# Patient Record
Sex: Female | Born: 1955
Health system: Southern US, Community
[De-identification: ages and names within clinical notes are randomized; demographics above are authoritative.]

## PROBLEM LIST (undated history)

## (undated) DIAGNOSIS — I219 Acute myocardial infarction, unspecified: Secondary | ICD-10-CM

## (undated) DIAGNOSIS — R7303 Prediabetes: Secondary | ICD-10-CM

## (undated) DIAGNOSIS — Z87442 Personal history of urinary calculi: Secondary | ICD-10-CM

## (undated) DIAGNOSIS — I82409 Acute embolism and thrombosis of unspecified deep veins of unspecified lower extremity: Secondary | ICD-10-CM

## (undated) DIAGNOSIS — I7101 Dissection of ascending aorta: Secondary | ICD-10-CM

## (undated) DIAGNOSIS — G4733 Obstructive sleep apnea (adult) (pediatric): Principal | ICD-10-CM

## (undated) DIAGNOSIS — R319 Hematuria, unspecified: Secondary | ICD-10-CM

## (undated) DIAGNOSIS — D649 Anemia, unspecified: Secondary | ICD-10-CM

## (undated) DIAGNOSIS — I2510 Atherosclerotic heart disease of native coronary artery without angina pectoris: Secondary | ICD-10-CM

## (undated) DIAGNOSIS — K219 Gastro-esophageal reflux disease without esophagitis: Secondary | ICD-10-CM

## (undated) DIAGNOSIS — G4730 Sleep apnea, unspecified: Secondary | ICD-10-CM

## (undated) DIAGNOSIS — I509 Heart failure, unspecified: Secondary | ICD-10-CM

## (undated) DIAGNOSIS — E785 Hyperlipidemia, unspecified: Secondary | ICD-10-CM

## (undated) DIAGNOSIS — R42 Dizziness and giddiness: Secondary | ICD-10-CM

## (undated) DIAGNOSIS — I420 Dilated cardiomyopathy: Secondary | ICD-10-CM

## (undated) DIAGNOSIS — Z9989 Dependence on other enabling machines and devices: Principal | ICD-10-CM

## (undated) DIAGNOSIS — Z5189 Encounter for other specified aftercare: Secondary | ICD-10-CM

## (undated) DIAGNOSIS — M1990 Unspecified osteoarthritis, unspecified site: Secondary | ICD-10-CM

## (undated) DIAGNOSIS — H269 Unspecified cataract: Secondary | ICD-10-CM

## (undated) DIAGNOSIS — I351 Nonrheumatic aortic (valve) insufficiency: Secondary | ICD-10-CM

## (undated) DIAGNOSIS — IMO0001 Reserved for inherently not codable concepts without codable children: Secondary | ICD-10-CM

## (undated) DIAGNOSIS — I10 Essential (primary) hypertension: Secondary | ICD-10-CM

## (undated) HISTORY — DX: Morbid (severe) obesity due to excess calories: E66.01

## (undated) HISTORY — DX: Acute embolism and thrombosis of unspecified deep veins of unspecified lower extremity: I82.409

## (undated) HISTORY — DX: Unspecified cataract: H26.9

## (undated) HISTORY — DX: Gastro-esophageal reflux disease without esophagitis: K21.9

## (undated) HISTORY — DX: Essential (primary) hypertension: I10

## (undated) HISTORY — DX: Hematuria, unspecified: R31.9

## (undated) HISTORY — DX: Dilated cardiomyopathy: I42.0

## (undated) HISTORY — DX: Dissection of thoracic aorta: I71.01

## (undated) HISTORY — DX: Anemia, unspecified: D64.9

## (undated) HISTORY — DX: Hyperlipidemia, unspecified: E78.5

## (undated) HISTORY — DX: Obstructive sleep apnea (adult) (pediatric): G47.33

## (undated) HISTORY — DX: Reserved for inherently not codable concepts without codable children: IMO0001

## (undated) HISTORY — DX: Nonrheumatic aortic (valve) insufficiency: I35.1

## (undated) HISTORY — DX: Dependence on other enabling machines and devices: Z99.89

## (undated) HISTORY — DX: Dissection of ascending aorta: I71.010

## (undated) HISTORY — DX: Acute myocardial infarction, unspecified: I21.9

## (undated) HISTORY — DX: Encounter for other specified aftercare: Z51.89

---

## 1981-01-12 HISTORY — PX: ABDOMINAL HYSTERECTOMY: SHX81

## 1997-03-12 ENCOUNTER — Ambulatory Visit (HOSPITAL_COMMUNITY): Admission: RE | Admit: 1997-03-12 | Discharge: 1997-03-12 | Payer: Self-pay | Admitting: Internal Medicine

## 1997-05-25 ENCOUNTER — Emergency Department (HOSPITAL_COMMUNITY): Admission: EM | Admit: 1997-05-25 | Discharge: 1997-05-25 | Payer: Self-pay | Admitting: Emergency Medicine

## 1997-07-17 ENCOUNTER — Emergency Department (HOSPITAL_COMMUNITY): Admission: EM | Admit: 1997-07-17 | Discharge: 1997-07-17 | Payer: Self-pay | Admitting: Emergency Medicine

## 1997-09-24 ENCOUNTER — Ambulatory Visit (HOSPITAL_COMMUNITY): Admission: RE | Admit: 1997-09-24 | Discharge: 1997-09-24 | Payer: Self-pay | Admitting: Internal Medicine

## 1998-01-17 ENCOUNTER — Inpatient Hospital Stay (HOSPITAL_COMMUNITY): Admission: AD | Admit: 1998-01-17 | Discharge: 1998-01-17 | Payer: Self-pay | Admitting: Internal Medicine

## 1998-03-03 ENCOUNTER — Emergency Department (HOSPITAL_COMMUNITY): Admission: EM | Admit: 1998-03-03 | Discharge: 1998-03-03 | Payer: Self-pay | Admitting: Emergency Medicine

## 1998-03-03 ENCOUNTER — Encounter: Payer: Self-pay | Admitting: Emergency Medicine

## 1998-04-04 ENCOUNTER — Emergency Department (HOSPITAL_COMMUNITY): Admission: EM | Admit: 1998-04-04 | Discharge: 1998-04-04 | Payer: Self-pay | Admitting: Emergency Medicine

## 1998-05-07 ENCOUNTER — Inpatient Hospital Stay (HOSPITAL_COMMUNITY): Admission: EM | Admit: 1998-05-07 | Discharge: 1998-05-09 | Payer: Self-pay | Admitting: Emergency Medicine

## 1998-05-07 ENCOUNTER — Encounter: Payer: Self-pay | Admitting: Internal Medicine

## 1998-05-08 ENCOUNTER — Encounter: Payer: Self-pay | Admitting: Internal Medicine

## 1998-06-04 ENCOUNTER — Encounter: Payer: Self-pay | Admitting: Emergency Medicine

## 1998-06-04 ENCOUNTER — Emergency Department (HOSPITAL_COMMUNITY): Admission: EM | Admit: 1998-06-04 | Discharge: 1998-06-04 | Payer: Self-pay | Admitting: Emergency Medicine

## 1998-06-29 ENCOUNTER — Emergency Department (HOSPITAL_COMMUNITY): Admission: EM | Admit: 1998-06-29 | Discharge: 1998-06-29 | Payer: Self-pay | Admitting: Emergency Medicine

## 1998-07-19 ENCOUNTER — Encounter: Admission: RE | Admit: 1998-07-19 | Discharge: 1998-10-17 | Payer: Self-pay | Admitting: Orthopedic Surgery

## 1999-01-13 HISTORY — PX: CHOLECYSTECTOMY: SHX55

## 1999-09-25 ENCOUNTER — Encounter: Payer: Self-pay | Admitting: Emergency Medicine

## 1999-09-25 ENCOUNTER — Inpatient Hospital Stay (HOSPITAL_COMMUNITY): Admission: EM | Admit: 1999-09-25 | Discharge: 1999-09-27 | Payer: Self-pay | Admitting: Emergency Medicine

## 1999-09-27 ENCOUNTER — Encounter: Payer: Self-pay | Admitting: Internal Medicine

## 1999-10-27 ENCOUNTER — Other Ambulatory Visit: Admission: RE | Admit: 1999-10-27 | Discharge: 1999-10-27 | Payer: Self-pay | Admitting: Internal Medicine

## 2000-04-21 ENCOUNTER — Emergency Department (HOSPITAL_COMMUNITY): Admission: EM | Admit: 2000-04-21 | Discharge: 2000-04-21 | Payer: Self-pay | Admitting: Emergency Medicine

## 2000-11-12 ENCOUNTER — Emergency Department (HOSPITAL_COMMUNITY): Admission: EM | Admit: 2000-11-12 | Discharge: 2000-11-13 | Payer: Self-pay | Admitting: Emergency Medicine

## 2001-05-19 ENCOUNTER — Emergency Department (HOSPITAL_COMMUNITY): Admission: EM | Admit: 2001-05-19 | Discharge: 2001-05-20 | Payer: Self-pay | Admitting: Emergency Medicine

## 2001-05-20 ENCOUNTER — Encounter: Payer: Self-pay | Admitting: Emergency Medicine

## 2001-09-07 ENCOUNTER — Encounter: Admission: RE | Admit: 2001-09-07 | Discharge: 2001-09-07 | Payer: Self-pay | Admitting: Internal Medicine

## 2001-09-07 ENCOUNTER — Encounter: Payer: Self-pay | Admitting: Internal Medicine

## 2003-04-12 ENCOUNTER — Emergency Department (HOSPITAL_COMMUNITY): Admission: EM | Admit: 2003-04-12 | Discharge: 2003-04-12 | Payer: Self-pay | Admitting: Emergency Medicine

## 2004-04-01 ENCOUNTER — Other Ambulatory Visit: Admission: RE | Admit: 2004-04-01 | Discharge: 2004-04-01 | Payer: Self-pay | Admitting: Internal Medicine

## 2004-04-21 ENCOUNTER — Encounter: Admission: RE | Admit: 2004-04-21 | Discharge: 2004-04-21 | Payer: Self-pay | Admitting: Internal Medicine

## 2004-04-29 ENCOUNTER — Ambulatory Visit (HOSPITAL_COMMUNITY): Admission: RE | Admit: 2004-04-29 | Discharge: 2004-04-29 | Payer: Self-pay | Admitting: Gastroenterology

## 2004-04-29 ENCOUNTER — Encounter (INDEPENDENT_AMBULATORY_CARE_PROVIDER_SITE_OTHER): Payer: Self-pay | Admitting: *Deleted

## 2005-04-08 ENCOUNTER — Emergency Department (HOSPITAL_COMMUNITY): Admission: EM | Admit: 2005-04-08 | Discharge: 2005-04-08 | Payer: Self-pay | Admitting: Emergency Medicine

## 2005-04-14 ENCOUNTER — Ambulatory Visit (HOSPITAL_BASED_OUTPATIENT_CLINIC_OR_DEPARTMENT_OTHER): Admission: RE | Admit: 2005-04-14 | Discharge: 2005-04-14 | Payer: Self-pay | Admitting: Urology

## 2005-04-20 ENCOUNTER — Ambulatory Visit (HOSPITAL_COMMUNITY): Admission: RE | Admit: 2005-04-20 | Discharge: 2005-04-20 | Payer: Self-pay | Admitting: Urology

## 2005-12-18 ENCOUNTER — Encounter: Admission: RE | Admit: 2005-12-18 | Discharge: 2005-12-18 | Payer: Self-pay | Admitting: Internal Medicine

## 2007-01-13 LAB — HM COLONOSCOPY: HM Colonoscopy: NORMAL

## 2007-01-14 ENCOUNTER — Encounter: Admission: RE | Admit: 2007-01-14 | Discharge: 2007-01-14 | Payer: Self-pay | Admitting: Internal Medicine

## 2008-07-10 ENCOUNTER — Encounter: Admission: RE | Admit: 2008-07-10 | Discharge: 2008-07-10 | Payer: Self-pay | Admitting: Internal Medicine

## 2009-06-28 ENCOUNTER — Emergency Department (HOSPITAL_COMMUNITY): Admission: EM | Admit: 2009-06-28 | Discharge: 2009-06-28 | Payer: Self-pay | Admitting: Emergency Medicine

## 2009-10-07 ENCOUNTER — Encounter: Admission: RE | Admit: 2009-10-07 | Discharge: 2009-10-07 | Payer: Self-pay | Admitting: Internal Medicine

## 2009-10-07 LAB — HM MAMMOGRAPHY

## 2009-11-12 LAB — CONVERTED CEMR LAB: Pap Smear: NORMAL

## 2009-11-22 ENCOUNTER — Encounter: Payer: Self-pay | Admitting: Internal Medicine

## 2009-11-28 ENCOUNTER — Encounter: Payer: Self-pay | Admitting: Internal Medicine

## 2009-11-28 LAB — CONVERTED CEMR LAB
BUN: 11 mg/dL
CO2: 28 meq/L
Calcium: 9.2 mg/dL
Chloride: 103 meq/L
Creatinine, Ser: 0.93 mg/dL
Glucose, Bld: 107 mg/dL
Potassium: 3.3 meq/L
Sodium: 138 meq/L

## 2009-12-01 ENCOUNTER — Emergency Department (HOSPITAL_COMMUNITY): Admission: EM | Admit: 2009-12-01 | Discharge: 2009-12-02 | Payer: Self-pay | Admitting: Emergency Medicine

## 2009-12-02 ENCOUNTER — Encounter: Payer: Self-pay | Admitting: Internal Medicine

## 2009-12-02 LAB — CONVERTED CEMR LAB
BUN: 15 mg/dL
Chloride: 106 meq/L
Creatinine, Ser: 1.1 mg/dL
Glucose, Bld: 105 mg/dL
HCT: 41 %
Hemoglobin: 13.9 g/dL
Potassium: 3.6 meq/L
Sodium: 140 meq/L

## 2009-12-16 ENCOUNTER — Encounter: Payer: Self-pay | Admitting: Internal Medicine

## 2009-12-16 DIAGNOSIS — Z8679 Personal history of other diseases of the circulatory system: Secondary | ICD-10-CM | POA: Insufficient documentation

## 2009-12-16 DIAGNOSIS — E785 Hyperlipidemia, unspecified: Secondary | ICD-10-CM | POA: Insufficient documentation

## 2009-12-16 DIAGNOSIS — I119 Hypertensive heart disease without heart failure: Secondary | ICD-10-CM | POA: Insufficient documentation

## 2009-12-16 DIAGNOSIS — Z87442 Personal history of urinary calculi: Secondary | ICD-10-CM | POA: Insufficient documentation

## 2009-12-16 DIAGNOSIS — IMO0001 Reserved for inherently not codable concepts without codable children: Secondary | ICD-10-CM | POA: Insufficient documentation

## 2009-12-17 ENCOUNTER — Encounter: Payer: Self-pay | Admitting: Internal Medicine

## 2009-12-17 ENCOUNTER — Ambulatory Visit: Payer: Self-pay | Admitting: Internal Medicine

## 2009-12-17 DIAGNOSIS — R319 Hematuria, unspecified: Secondary | ICD-10-CM | POA: Insufficient documentation

## 2009-12-18 DIAGNOSIS — K219 Gastro-esophageal reflux disease without esophagitis: Secondary | ICD-10-CM | POA: Insufficient documentation

## 2009-12-18 LAB — CONVERTED CEMR LAB
Bilirubin Urine: NEGATIVE
Hemoglobin, Urine: NEGATIVE
Ketones, ur: NEGATIVE mg/dL
Leukocytes, UA: NEGATIVE
Nitrite: NEGATIVE
Specific Gravity, Urine: 1.015 (ref 1.000–1.030)
Total Protein, Urine: NEGATIVE mg/dL
Urine Glucose: NEGATIVE mg/dL
Urobilinogen, UA: 0.2 (ref 0.0–1.0)
pH: 7 (ref 5.0–8.0)

## 2010-01-02 ENCOUNTER — Telehealth (INDEPENDENT_AMBULATORY_CARE_PROVIDER_SITE_OTHER): Payer: Self-pay | Admitting: *Deleted

## 2010-01-11 ENCOUNTER — Emergency Department (HOSPITAL_COMMUNITY)
Admission: EM | Admit: 2010-01-11 | Discharge: 2010-01-11 | Payer: Self-pay | Source: Home / Self Care | Admitting: Family Medicine

## 2010-01-14 ENCOUNTER — Ambulatory Visit
Admission: RE | Admit: 2010-01-14 | Discharge: 2010-01-14 | Payer: Self-pay | Source: Home / Self Care | Attending: Internal Medicine | Admitting: Internal Medicine

## 2010-01-14 ENCOUNTER — Telehealth: Payer: Self-pay | Admitting: Internal Medicine

## 2010-01-14 ENCOUNTER — Other Ambulatory Visit: Payer: Self-pay | Admitting: Internal Medicine

## 2010-01-14 LAB — URINALYSIS, ROUTINE W REFLEX MICROSCOPIC
Bilirubin Urine: NEGATIVE
Hemoglobin, Urine: NEGATIVE
Ketones, ur: NEGATIVE
Leukocytes, UA: NEGATIVE
Nitrite: NEGATIVE
Specific Gravity, Urine: 1.02 (ref 1.000–1.030)
Total Protein, Urine: NEGATIVE
Urine Glucose: NEGATIVE
Urobilinogen, UA: 0.2 (ref 0.0–1.0)
pH: 7.5 (ref 5.0–8.0)

## 2010-01-16 ENCOUNTER — Encounter (INDEPENDENT_AMBULATORY_CARE_PROVIDER_SITE_OTHER): Payer: Self-pay | Admitting: *Deleted

## 2010-01-16 ENCOUNTER — Telehealth: Payer: Self-pay | Admitting: Internal Medicine

## 2010-01-28 ENCOUNTER — Ambulatory Visit
Admission: RE | Admit: 2010-01-28 | Discharge: 2010-01-28 | Payer: Self-pay | Source: Home / Self Care | Attending: Internal Medicine | Admitting: Internal Medicine

## 2010-02-02 ENCOUNTER — Encounter: Payer: Self-pay | Admitting: Internal Medicine

## 2010-02-03 ENCOUNTER — Encounter: Payer: Self-pay | Admitting: Internal Medicine

## 2010-02-13 NOTE — Assessment & Plan Note (Signed)
Summary: NEW / MEDCOST Alexis Wheeler   Vital Signs:  Patient profile:   55 year old female Weight:      202 pounds O2 Sat:      96 % on Room air Temp:     98.9 degrees F oral Pulse rate:   82 / minute Pulse rhythm:   regular BP sitting:   140 / 98  (left arm) Cuff size:   large  Vitals Entered By: Rock Nephew CMA (December 17, 2009 3:12 PM)  O2 Flow:  Room air CC: New Patient, discuss elevated BP Is Patient Diabetic? No Pain Assessment Patient in pain? no       Does patient need assistance? Functional Status Self care Ambulation Normal Comments Pt not taking Norvasc and Metoprolol   Primary Care Provider:  Newt Lukes MD  CC:  New Patient and discuss elevated BP.  History of Present Illness: new pt to me and our practice, here to est care prev followed with [facility]ne and gyn ([facility])  c/o uncontrolled HTN - onset mid20s - on meds since that time - prior tx includes dyazide (remains on same), toprol (caused fatigue), amlodipine (?why stopped), benicar (caused HA) and benazapril (recent dose increase) a/w HA when BP elevated, also chronic edema no assoc CP or vision changes - not improved with low salt diet no known kidney problems but reports chronic recurrent hematuria (none in past 3 days)  c/o GERD - episodes 1-2x/week but inc freq.  takes OTC meds for same with partial relief - no abd pain, no nausea, vomitting or change in bowels - denies relief from dietary changes, some improved with position change (elevation of head of bed) - reports plans for prior egd to eval same but unable to f/u with gi due to missed appt and dismissal from practice ([facility])    Preventive Screening-Counseling & Management  Alcohol-Tobacco     Alcohol drinks/day: 0     Alcohol Counseling: not indicated; patient does not drink     Smoking Status: never     Tobacco Counseling: not indicated; no tobacco use  Caffeine-Diet-Exercise     Does Patient Exercise: no     Exercise  Counseling: to improve exercise regimen     Depression Counseling: not indicated; screening negative for depression  Safety-Violence-Falls     Seat Belt Counseling: not indicated; patient wears seat belts     Firearm Counseling: not applicable     Violence Counseling: not indicated; no violence risk noted  Clinical Review Panels:  Prevention   Last Mammogram:  [facility].   No specific mammographic evidence of malignancy.  Assessment: BIRADS 1. (10/07/2009)   Last Pap Smear:  normal (11/12/2009)   Last Colonoscopy:  normal (01/13/2007)  Diabetes Management   Creatinine:  1.1 (12/02/2009)  CBC   Hgb:  13.9 (12/02/2009)   Hct:  41.0 (12/02/2009)  Complete Metabolic Panel   Glucose:  105 (12/02/2009)   Sodium:  140 (12/02/2009)   Potassium:  3.6 (12/02/2009)   Chloride:  106 (12/02/2009)   CO2:  28 (11/28/2009)   BUN:  15 (12/02/2009)   Creatinine:  1.1 (12/02/2009)   Calcium:  9.2 (11/28/2009)   Current Medications (verified): 1)  Norvasc 5 Mg Tabs (Amlodipine Besylate) .... Take 1 By Mouth Once Daily 2)  Metoprolol Succinate 50 Mg Xr24h-Tab (Metoprolol Succinate) .... Take 1 By Mouth Once Daily 3)  Dyazide 37.5-25 Mg Caps (Triamterene-Hctz) .... Take 1 By Mouth Once Daily 4)  Benazepril Hcl  40 Mg Tabs (Benazepril Hcl) .... Take 1 Tablet By Mouth Once A Day 5)  Klor-Con M20 20 Meq Cr-Tabs (Potassium Chloride Crys Cr) .... Take 1 Tablet By Mouth Once A Day 6)  Bayer 81mg  .... Take 1 Tablet By Mouth Once A Day 7)  Vitamin B 12 250mg  .... Take 1 Tablet By Mouth Once A Day 8)  Vitamin C 500 .... Take 1 Tablet By Mouth Once A Day 9)  Vitamin D3 1000mg  .... Take One Tablet Every Other Day 10)  Biotin .... Take 1 Tablet By Mouth Once A Day  Allergies (verified): 1)  ! 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  The patient denies anorexia, fever, weight loss, chest pain, syncope, dyspnea on exertion, headaches, abdominal pain, melena, hematochezia, incontinence, genital sores, muscle weakness, suspicious skin lesions, depression, and breast masses.         otherwise see HPI above. I have reviewed all other systems and they were negative.   Physical Exam  General:  overweight-appearing.  alert, well-developed, well-nourished, and cooperative to examination.    Head:  Normocephalic and atraumatic without obvious abnormalities. No apparent alopecia or balding. Eyes:  vision grossly intact; pupils equal, round and reactive to light.  conjunctiva and lids normal.    Ears:  B normal appearing ears. Hearing grossly normal bilaterally  Mouth:  teeth and gums in good repair; mucous membranes moist, without lesions or ulcers. oropharynx clear without exudate, no erythema.  Neck:  thick, supple, full ROM, no masses, no thyromegaly; no thyroid nodules or tenderness. no JVD or carotid bruits.   Lungs:  normal respiratory effort, no intercostal retractions or use of accessory muscles; normal breath sounds bilaterally - no crackles and no wheezes.    Heart:  normal rate, regular rhythm, no murmur, and no rub. BLE without edema but fatty ankles Abdomen:  obese, soft, non-tender, normal bowel sounds, no distention; no masses and no appreciable hepatomegaly or splenomegaly.   Msk:  No deformity or scoliosis noted  of thoracic or lumbar spine.   Neurologic:  alert & oriented X3 and cranial nerves II-XII symetrically intact.  strength normal in all extremities, sensation intact to light touch, and gait normal. speech fluent without dysarthria or aphasia; follows commands with good comprehension.  Skin:  no rashes, vesicles, ulcers, or erythema. No nodules or irregularity to palpation.  Psych:  Oriented X3, memory intact for recent and remote, normally interactive, good eye contact, not anxious appearing, not depressed appearing, and not agitated.      Impression & Recommendations:  Problem # 1:  HYPERTENSION (ICD-401.9)  subop control on current meds - pt reports labile - will send for prior records to review from PCP add amlodipine to current acei and diuretic combo tx limited by $$ - needs med from $[facility] planned - reviewed same  today with pt recheck her 2 weeks and cont titration and med adjustment as needed  The following medications were removed from the medication list:    Metoprolol Succinate 50 Mg Xr24h-tab (Metoprolol succinate) .[name]... Take 1 by mouth once daily Her updated medication list for this problem includes:    Amlodipine Besylate 5 Mg Tabs (Amlodipine besylate) .[name]... 1 by mouth once daily    Dyazide 37.5-25 Mg Caps (Triamterene-hctz) .[name]... Take 1 by mouth once daily    Benazepril Hcl 40 Mg Tabs (Benazepril hcl) .[name]... Take 1 tablet by mouth once a day  BP today: 140/98  Labs Reviewed: K+: 3.6 (12/02/2009) Creat: : 1.1 (12/02/2009)     Orders: Prescription Created Electronically [id number]) EKG w/ Interpretation (93000)  Problem # 2:  GERD (ICD-530.81)  freq symptoms not relieved with otc meds  not using PPI - add same now - erx done pt concerned with need for EGD - exam benign, no apparent risk for ulcer dz review prior records once here and monitor for response to PPI - consider refer as needed  Her updated medication list for this problem includes:    Omeprazole 20 Mg  Cpdr (Omeprazole) .[name]... 1 by mouth once daily  Orders: Prescription Created Electronically ([id number])  Problem # 3:  HEMATURIA UNSPECIFIED (ICD-599.70)  c/o intermitt gross hematuria - symptoms worse with uti but present even after abx tx by gyn -  check UA now and consider uro eval if needed Orders: TLB-Udip w/ Micro (81001-URINE)  Discussed medication use and hematuria work-up.   Problem # 4:  HYPERLIPIDEMIA (ICD-272.4) send for prior records - advised diet and exercise lifestyle changes  Problem # 5:  MORBID OBESITY (ICD-278.01)  Wt: 202 (12/17/2009)     Complete Medication List: 1)  Amlodipine Besylate 5 Mg Tabs (Amlodipine besylate) .[name].. 1 by mouth once daily 2)  Dyazide 37.5-25 Mg Caps (Triamterene-hctz) .... Take 1 by mouth once daily 3)  Benazepril Hcl 40 Mg Tabs (Benazepril hcl) .... Take 1 tablet by mouth once a day 4)  Klor-con M20 20 Meq Cr-tabs (Potassium chloride crys cr) .... Take 1 tablet by mouth once a day 5)  Bayer Low Strength 81 Mg Tbec (Aspirin) .[name].. 1 by mouth once daily 6)  Vitamin B 12 250mg   .... Take 1 tablet by mouth once a day 7)  Vitamin C 500  .... Take 1 tablet by mouth once a day 8)  Vitamin D3 1000mg   .... Take one tablet every other day 9)  Biotin  .... Take 1 tablet by mouth once a day 10)  Omeprazole 20 Mg Cpdr (Omeprazole) .[name].. 1 by mouth once daily  Patient Instructions: 1)  it was good to see you today. 2)  will send for records from dr. Concepcion Elk to review 3)  start amlodipine (generic norvasc) in addition to benazapril and maxide for high blood pressure 4)  also omeprazole for stomach - take this every day 5)  your prescriptions have been electronically submitted to your pharmacy. Please take as directed. Contact our office if you believe you're having problems with the medication(s).  6)  urine test(s) ordered today - your results will be posted on the phone tree for review in 48-72 hours from the time of test completion; call  [phone] and enter your 9 digit MRN (listed above on this page, just below your name); if any changes need to be made or there are abnormal results, you will be contacted directly.  7)  Please schedule a  follow-up appointment in 4 weeks to recheck blood pressure and continue review, call sooner if problems.  Prescriptions: OMEPRAZOLE 20 MG CPDR (OMEPRAZOLE) 1 by mouth once daily  #30 x 3   Entered and Authorized by:   Newt Lukes MD   Signed by:   Newt Lukes MD on 12/17/2009   Method used:   Electronically to        [facility] (retail)       [address]       [place], [place]  [address]       Ph: [phone]       Fax: [phone]   RxID:   [id number] AMLODIPINE BESYLATE 5 MG TABS (AMLODIPINE BESYLATE) 1 by mouth once daily  #30 x 3   Entered and Authorized by:   Newt Lukes MD   Signed by:   Newt Lukes MD on 12/17/2009   Method used:   Electronically to        [facility] (retail)       [address]       [place], [place]  [address]       Ph: [phone]       Fax: [phone]   RxID:   [id number]    Orders Added: 1)  TLB-Udip w/ Micro [81001-URINE] 2)  New Patient Level IV [88416] 3)  Prescription Created Electronically [G8553] 4)  EKG w/ Interpretation [93000]   Not Administered:    Influenza Vaccine not given due to: declined   Preventive Care Screening  Pap Smear:    Date:  11/12/2009    Results:  normal   Colonoscopy:    Date:  01/13/2007    Results:  normal

## 2010-02-13 NOTE — Progress Notes (Signed)
Summary: work note  Phone Note Call from Patient Call back at [employer] [phone]   Caller: Patient Call For: Newt Lukes MD Summary of Call: Pt had an appt on 01/14/2010 and pt stated that her work does not let her come back to work without  Dr. Diamantina Monks note. Please advise.  Initial call taken by: Livingston Diones,  January 16, 2010 10:35 AM  Follow-up for Phone Call        work note done and signed Follow-up by: Newt Lukes MD,  January 16, 2010 11:11 AM

## 2010-02-13 NOTE — Letter (Signed)
Summary: Out of Work  [facility]  [address] [place], [place] [address]   Phone: [phone]  Fax: [phone]    January 16, 2010   Employee:  Alexis Wheeler    To Whom It May Concern:   For Medical reasons, please excuse the above named employee from work for the following dates:  Start: 01/14/10    End: 01/16/10, May return back to work on Friday 01/17/10    If you need additional information, please feel free to contact our office.         Sincerely,    Dr. Rene Paci

## 2010-02-13 NOTE — Assessment & Plan Note (Signed)
Summary: 2 - 3 WK FU  STC   Vital Signs:  Patient profile:   55 year old female Height:      67 inches (170.18 cm) Weight:      202 pounds (91.82 kg) O2 Sat:      98 % on Room air Temp:     98.3 degrees F (36.83 degrees C) oral Pulse rate:   80 / minute BP sitting:   142 / 82  (left arm) Cuff size:   large  Vitals Entered By: Orlan Leavens RMA (January 28, 2010 10:42 AM)  O2 Flow:  Room air CC: 2-3 week follow-up Is Patient Diabetic? No Pain Assessment Patient in pain? yes     Location: lower back Type: aching   Primary Care Provider:  Newt Lukes MD  CC:  2-3 week follow-up.  History of Present Illness: here for f/u-  HTN - onset mid20s - on meds since that time - prior tx includes dyazide (remains on same), toprol (caused fatigue), amlodipine, benicar (caused HA) and benazapril (recent dose increase) a/w HA when BP elevated, also chronic edema no assoc CP or vision changes - not improved with low salt diet no known kidney problems but reports chronic recurrent hematuria (none in past 3 days) syncope 01/2009 from low BP - meds adjusted -  tol current combo well, no further syncope  GERD - episodes 1-2x/week but inc freq.  takes OTC meds for same with partial relief - no abd pain, no nausea, vomitting or change in bowels - denies relief from dietary changes, some improved with position change (elevation of head of bed) - reports plans for prior egd to eval same but unable to f/u with gi due to missed appt and dismissal from practice ([facility]) - some improved with PPI once daily - still with gas  LBP - onset 2 weeks ago s/p fall/syncope - no weakness, no numbness, no difficulty walking - improved with heat and using meds form ER (not used at time of fall when rx'd)    Clinical Review Panels:  Complete Metabolic Panel   Glucose:  105 (12/02/2009)   Sodium:  140 (12/02/2009)   Potassium:  3.6 (12/02/2009)   Chloride:  106 (12/02/2009)   CO2:  28 (11/28/2009)  BUN:  15 (12/02/2009)   Creatinine:  1.1 (12/02/2009)   Calcium:  9.2 (11/28/2009)   Current Medications (verified): 1)  Amlodipine Besylate 5 Mg Tabs (Amlodipine Besylate) .... 1/2 By Mouth Once Daily 2)  Dyazide 37.5-25 Mg Caps (Triamterene-Hctz) .... Take 1 By Mouth Once Daily 3)  Benazepril Hcl 40 Mg Tabs (Benazepril Hcl) .... Take 1 Tablet By Mouth Once A Day 4)  Klor-Con M20 20 Meq Cr-Tabs (Potassium Chloride Crys Cr) .... Take 1 Tablet By Mouth Once A Day 5)  Bayer Low Strength 81 Mg Tbec (Aspirin) .[name].. 1 By Mouth Once Daily 6)  Vitamin B 12 250mg  .... Take 1 Tablet By Mouth Once A Day 7)  Vitamin C 500 .... Take 1 Tablet By Mouth Once A Day 8)  Vitamin D3 1000mg  .... Take One Tablet Every Other Day 9)  Biotin .... Take 1 Tablet By Mouth Once A Day 10)  Omeprazole 20 Mg Cpdr (Omeprazole) .[name].. 1 By Mouth Two Times A Day  Allergies (verified): 1)  ! Motrin  Past History:  Past Medical History: Hyperlipidemia Hypertension GERD  intermit hematuria  MD roster:  [facility] - [facility] - prev mann uro -  Review of Systems  The  patient denies anorexia, chest pain, syncope, dyspnea on exertion, and headaches.    Physical Exam  General:  overweight-appearing.  alert, well-developed, well-nourished, and cooperative to examination.    Lungs:  normal respiratory effort, no intercostal retractions or use of accessory muscles; normal breath sounds bilaterally - no crackles and no wheezes.    Heart:  normal rate, regular rhythm, no murmur, and no rub. BLE without edema but fatty ankles Msk:  back: full range of motion of lumbar spine. Nontender to palpation. Negative straight leg raise. Deep tendon reflexes symmetrically intact. Sensation intact throughout all dermatomes in bilateral lower extremities. Full strength to manual muscle testing. Able to heel and toe walk without difficulty and ambulates with a normal gait.    Impression & Recommendations:  Problem # 1:   HYPERTENSION (ICD-401.9)  Her updated medication list for this problem includes:    Amlodipine Besylate 2.5 Mg Tabs (Amlodipine besylate) .[name]... 1 by mouth once daily    Dyazide 37.5-25 Mg Caps (Triamterene-hctz) .[name]... Take 1 by mouth once daily    Benazepril Hcl 40 Mg Tabs (Benazepril hcl) .[name]... Take 1 tablet by mouth once a day  subop control on current meds - pt reports labile - UC eval for syncope and hypotension 01/11/10 reviewed - reduced dose amlodipine  cont same + diuretic combo + benazapril l prev (misunderstood to taking all now) tx limited by $$ - needs med from [facility] planned -   BP today: 142/82 Prior BP: 142/110 (01/14/2010) Prior BP: 140/98 (12/17/2009)  Labs Reviewed: K+: 3.6 (12/02/2009) Creat: : 1.1 (12/02/2009)     Problem # 2:  GERD (ICD-530.81)  Her updated medication list for this problem includes:    Omeprazole 20 Mg Cpdr (Omeprazole) .[name]... 1 by mouth two times a day  freq symptoms not relieved with otc meds  improved by using PPI - cont same now pt concerned with need for EGD - exam benign, no apparent risk for ulcer dz - will monitor  Labs Reviewed: Hgb: 13.9 (12/02/2009)   Hct: 41.0 (12/02/2009)  Problem # 3:  HEMATURIA UNSPECIFIED (ICD-599.70)  c/o intermitt gross hematuria - symptoms worse with uti but present even after abx tx by gyn -  01/2009 UA neg blood but +blood in ER 01/11/10  eval uro pending as never been seen by uro for same per pt  Discussed medication use and hematuria work-up.   Complete Medication List: 1)  Amlodipine Besylate 2.5 Mg Tabs (Amlodipine besylate) .[name].. 1 by mouth once daily 2)  Dyazide 37.5-25 Mg Caps (Triamterene-hctz) .... Take 1 by mouth once daily 3)  Benazepril Hcl 40 Mg Tabs (Benazepril hcl) .... Take 1 tablet by mouth once a day 4)  Klor-con M20 20 Meq Cr-tabs (Potassium chloride crys cr) .... Take 1 tablet by mouth once a day 5)  Bayer Low Strength 81 Mg Tbec (Aspirin) .[name].. 1 by mouth once daily 6)   Vitamin B 12 250mg   .... Take 1 tablet by mouth once a day 7)  Vitamin C 500  .... Take 1 tablet by mouth once a day 8)  Vitamin D3 1000mg   .... Take one tablet every other day 9)  Biotin  .... Take 1 tablet by mouth once a day 10)  Omeprazole 20 Mg Cpdr (Omeprazole) .[name].. 1 by mouth two times a day  Patient Instructions: 1)  it was good to see you today. 2)  continue medication as ongoing - new prescription for lower dose amlodipine done today -  3)  Check your Blood Pressure regularly. If it is above: 140/90 you should make an appointment. 4)  Most patients (90%) with low back pain will improve with time (2-6 weeks). Keep active but avoid activities that are painful. Apply moist heat and/or ice to lower back several times a day. use the muscle relaxants and pain pills you have at home as needed  5)  Please schedule a follow-up appointment in 3-4 months to recheck blood pressure and medications, call sooner if problems.  Prescriptions: AMLODIPINE BESYLATE 2.5 MG TABS (AMLODIPINE BESYLATE) 1 by mouth once daily  #30 x 6   Entered and Authorized by:   Newt Lukes MD   Signed by:   Newt Lukes MD on 01/28/2010   Method used:   Electronically to        [facility] (retail)       [address]       [place], [place]  [address]       Ph: [phone]       Fax: [phone]   RxID:   [id number]    Orders Added: 1)  Est. Patient Level III [40102]

## 2010-02-13 NOTE — Letter (Signed)
[facility]   Imported By: [place] 12/23/2009 08:37:38  _____________________________________________________________________  External Attachment:    Type:   Image     Comment:   External Document

## 2010-02-13 NOTE — Assessment & Plan Note (Signed)
Summary: 4 WK FU  STC   Vital Signs:  Patient profile:   55 year old female Height:      67 inches (170.18 cm) Weight:      202.4 pounds (92 kg) BMI:     31.81 O2 Sat:      97 % on Room air Temp:     99.1 degrees F (37.28 degrees C) oral Pulse rate:   82 / minute BP sitting:   142 / 110  (left arm) Cuff size:   regular  Vitals Entered By: Orlan Leavens RMA (January 14, 2010 2:38 PM)  O2 Flow:  Room air CC: 4 week follow-up Is Patient Diabetic? No Pain Assessment Patient in pain? no      Comments Recheck BP in (R) arm 144/110. Pt states she was'nt sure if she was suppose to take Benazepril not been taking   Primary Care Provider:  Newt Lukes MD  CC:  4 week follow-up.  History of Present Illness: here for f/u-  [facility] following syncope event -  reduced amlodipine due to hypotension -  also noted blood in urine at ER, hx same (see below) - no dysuria, no fever no recurrent syncope -  no fever, flank pain or CP/HA  reviewed chronic med issues: HTN - onset mid20s - on meds since that time - prior tx includes dyazide (remains on same), toprol (caused fatigue), amlodipine, benicar (caused HA) and benazapril (recent dose increase) a/w HA when BP elevated, also chronic edema no assoc CP or vision changes - not improved with low salt diet no known kidney problems but reports chronic recurrent hematuria (none in past 3 days)  GERD - episodes 1-2x/week but inc freq.  takes OTC meds for same with partial relief - no abd pain, no nausea, vomitting or change in bowels - denies relief from dietary changes, some improved with position change (elevation of head of bed) - reports plans for prior egd to eval same but unable to f/u with gi due to missed appt and dismissal from practice (mann) - some improved with PPI once daily - still with gas    Clinical Review Panels:  Complete Metabolic Panel   Glucose:  105 (12/02/2009)   Sodium:  140 (12/02/2009)  Potassium:  3.6 (12/02/2009)   Chloride:  106 (12/02/2009)   CO2:  28 (11/28/2009)   BUN:  15 (12/02/2009)   Creatinine:  1.1 (12/02/2009)   Calcium:  9.2 (11/28/2009)   Current Medications (verified): 1)  Amlodipine Besylate 5 Mg Tabs (Amlodipine Besylate) .... 1/2 By Mouth Once Daily 2)  Dyazide 37.5-25 Mg Caps (Triamterene-Hctz) .... Take 1 By Mouth Once Daily 3)  Benazepril Hcl 40 Mg Tabs (Benazepril Hcl) .... Take 1 Tablet By Mouth Once A Day 4)  Klor-Con M20 20 Meq Cr-Tabs (Potassium Chloride Crys Cr) .... Take 1 Tablet By Mouth Once A Day 5)  Bayer Low Strength 81 Mg Tbec (Aspirin) .[name].. 1 By Mouth Once Daily 6)  Vitamin B 12 250mg  .... Take 1 Tablet By Mouth Once A Day 7)  Vitamin C 500 .... Take 1 Tablet By Mouth Once A Day 8)  Vitamin D3 1000mg  .... Take One Tablet Every Other Day 9)  Biotin .... Take 1 Tablet By Mouth Once A Day 10)  Omeprazole 20 Mg Cpdr (Omeprazole) .[name].. 1 By Mouth Once Daily  Allergies (verified): 1)  ! Motrin  Past History:  Past Medical History: Hyperlipidemia Hypertension GERD intermit hematuria  MD roster: gyn - [facility] - prev mann  Review of Systems  The patient denies chest pain, peripheral edema, abdominal pain, severe indigestion/heartburn, hematuria, muscle weakness, and difficulty walking.    Physical Exam  General:  overweight-appearing.  alert, well-developed, well-nourished, and cooperative to examination.    Lungs:  normal respiratory effort, no intercostal retractions or use of accessory muscles; normal breath sounds bilaterally - no crackles and no wheezes.    Heart:  normal rate, regular rhythm, no murmur, and no rub. BLE without edema but fatty ankles   Impression & Recommendations:  Problem # 1:  HYPERTENSION (ICD-401.9)  Her updated medication list for this problem includes:    Amlodipine Besylate 5 Mg Tabs (Amlodipine besylate) .[name]... 1/2 by mouth once daily    Dyazide 37.5-25 Mg Caps (Triamterene-hctz)  .[name]... Take 1 by mouth once daily    Benazepril Hcl 40 Mg Tabs (Benazepril hcl) .[name]... Take 1 tablet by mouth once a day  subop control on current meds - pt reports labile - UC eval for syncope and hypotension 01/11/10 reviewed -  cont reduced dose amlodipine and diuretic combo resume benazapril as well (misunderstood to take all but agrees to same now) tx limited by $$ - needs med from $[facility] planned - reviewed same today with pt recheck her 2-4 weeks and cont titration and med adjustment as needed   BP today: 142/110 Prior BP: 140/98 (12/17/2009)  Labs Reviewed: K+: 3.6 (12/02/2009) Creat: : 1.1 (12/02/2009)     Problem # 2:  HEMATURIA UNSPECIFIED (ICD-599.70)  Orders: TLB-Udip w/ Micro (81001-URINE) Urology Referral (Urology)  c/o intermitt gross hematuria - symptoms worse with uti but present even after abx tx by gyn -  check UA now as +blood in ER 01/11/10 (reviewed by me today) refer for uro as never been seen by uro for same per pt  Problem # 3:  GERD (ICD-530.81)  Her updated medication list for this problem includes:    Omeprazole 20 Mg Cpdr (Omeprazole) .[name]... 1 by mouth two times a day  freq symptoms not relieved with otc meds  improved by using PPI - inc dose now pt concerned with need for EGD - exam benign, no apparent risk for ulcer dz  Complete Medication List: 1)  Amlodipine Besylate 5 Mg Tabs (Amlodipine besylate) .... 1/2 by mouth once daily 2)  Dyazide 37.5-25 Mg Caps (Triamterene-hctz) .... Take 1 by mouth once daily 3)  Benazepril Hcl 40 Mg Tabs (Benazepril hcl) .... Take 1 tablet by mouth once a day 4)  Klor-con M20 20 Meq Cr-tabs (Potassium chloride crys cr) .... Take 1 tablet by mouth once a day 5)  Bayer Low Strength 81 Mg Tbec (Aspirin) .[name].. 1 by mouth once daily 6)  Vitamin B 12 250mg   .... Take 1 tablet by mouth once a day 7)  Vitamin C 500  .... Take 1 tablet by mouth once a day 8)  Vitamin D3 1000mg   .... Take one tablet every other day 9)   Biotin  .... Take 1 tablet by mouth once a day 10)  Omeprazole 20 Mg Cpdr (Omeprazole) .[name].. 1 by mouth two times a day  Patient Instructions: 1)  it was good to see you today. 2)  resume benazapril in addition to dyazide and 1/2 tab of amlodipine  3)  increase omeprazole to two times a day for stomach reflux 4)  test(s) ordered today - your results will be posted on the phone tree for review in  48-72 hours from the time of test completion; call [phone] and enter your 9 digit MRN (listed above on this page, just below your name); if any changes need to be made or there are abnormal results, you will be contacted directly.  5)  we'll make referral to urology for evaluation of the occassional blood in your urine. Our office will contact you regarding this appointment once made. 6)  Please schedule a follow-up appointment in 2-3 weeks to recheck blood pressure and medications, call sooner if problems.    Orders Added: 1)  TLB-Udip w/ Micro [81001-URINE] 2)  Est. Patient Level IV [[address]] 3)  Urology Referral [Urology]

## 2010-02-13 NOTE — Progress Notes (Signed)
Summary: Call Report  Phone Note Other Incoming   Caller: [facility] Summary of Call: [facility] Triage Call Report Triage Record Num: [id number] Operator: Caswell Corwin Patient Name: Alexis Wheeler Call Date & Time: 01/11/2010 4:02:55PM Patient Phone: [phone] PCP: Patient Gender: Female PCP Fax : Patient DOB: May 16, 1955 Practice Name: [facility] Reason for Call: Pt on Rx for Norvasc 5mg , passed out 01/10/10 PM at work an B/P was BP 80/50, calling to request different med. Went to [facility] and inst to F/U with Dr. Rock Nephew not having any sx now. Had blood in her U/A today at [facility] @ 1000. Not able to take B/P at home. B/P before leaving [facility] was 153/81. B/P now is 154/89. Triaged HTN and all ? neg. Paged Dr. Loreen Freud and recalled Dr. on cell at 1700 and message left. Recalled and inst to tell pt to break Norvaxc in half and then to call the office 12/14/09. Protocol(s) Used: Hypertension, Diagnosed or Suspected Recommended Outcome per Protocol: Provide Home/Self Care Reason for Outcome: Requesting information about hypertension Care Advice:  ~ 12/ Initial call taken by: Margaret Pyle, CMA,  January 14, 2010 8:19 AM  Follow-up for Phone Call        please call pt - ask her to cont 1/2 tab norvasc as per phone triage nurse and sched OV if still having probs to reck BP - thanks Follow-up by: Newt Lukes MD,  January 14, 2010 8:25 AM  Additional Follow-up for Phone Call Additional follow up Details #1::        Pt informed and advised that she has appt scheduled today to eval BP Additional Follow-up by: Margaret Pyle, CMA,  January 14, 2010 9:45 AM    New/Updated Medications: AMLODIPINE BESYLATE 5 MG TABS (AMLODIPINE BESYLATE) 1/2 by mouth once daily

## 2010-02-13 NOTE — Progress Notes (Signed)
  Phone Note Other Incoming   Request: Send information Summary of Call: Records received from [employer], [place]. 23 pages forwarded to Dr. Felicity Coyer for review.

## 2010-02-19 NOTE — Letter (Signed)
Summary: [facility]   Imported By: Sherian Rein 02/13/2010 12:16:09  _____________________________________________________________________  External Attachment:    Type:   Image     Comment:   External Document

## 2010-03-13 ENCOUNTER — Telehealth: Payer: Self-pay | Admitting: Internal Medicine

## 2010-03-20 NOTE — Progress Notes (Signed)
Summary: zolpidem  Phone Note Refill Request Message from:  Fax from Pharmacy on March 13, 2010 9:48 AM  Refills Requested: Medication #1:  Zolpidem tartrate 10mg  take 1 at bedtimes as neded # 30   Last Refilled: [facility] [facility] is not on medication list. Is this ok to refll?   Method Requested: Fax to Local Pharmacy Initial call taken by: Orlan Leavens RMA,  March 13, 2010 9:49 AM  Follow-up for Phone Call        ok Follow-up by: Newt Lukes MD,  March 13, 2010 12:04 PM    New/Updated Medications: ZOLPIDEM TARTRATE 10 MG TABS (ZOLPIDEM TARTRATE) take 1 at bedtime as needed Prescriptions: ZOLPIDEM TARTRATE 10 MG TABS (ZOLPIDEM TARTRATE) take 1 at bedtime as needed  #30 x 1   Entered by:   Orlan Leavens RMA   Authorized by:   Newt Lukes MD   Signed by:   Orlan Leavens RMA on 03/13/2010   Method used:   Printed then faxed to ...       [facility]  [address] [phone]* (retail)       [address]       [place], [place]  [id number]       Ph: [phone] or [phone]       Fax: [phone]   RxID:   [id number]

## 2010-03-24 ENCOUNTER — Telehealth: Payer: Self-pay | Admitting: Internal Medicine

## 2010-03-24 LAB — POCT I-STAT, CHEM 8
BUN: 16 mg/dL (ref 6–23)
Calcium, Ion: 1.11 mmol/L — ABNORMAL LOW (ref 1.12–1.32)
Chloride: 102 mEq/L (ref 96–112)
Creatinine, Ser: 1 mg/dL (ref 0.4–1.2)
Glucose, Bld: 104 mg/dL — ABNORMAL HIGH (ref 70–99)
HCT: 46 % (ref 36.0–46.0)
Hemoglobin: 15.6 g/dL — ABNORMAL HIGH (ref 12.0–15.0)
Potassium: 3.5 mEq/L (ref 3.5–5.1)
Sodium: 141 mEq/L (ref 135–145)
TCO2: 32 mmol/L (ref 0–100)

## 2010-03-24 LAB — POCT URINALYSIS DIPSTICK
Bilirubin Urine: NEGATIVE
Glucose, UA: NEGATIVE mg/dL
Ketones, ur: NEGATIVE mg/dL
Nitrite: NEGATIVE
Protein, ur: 100 mg/dL — AB
Specific Gravity, Urine: 1.02 (ref 1.005–1.030)
Urobilinogen, UA: 0.2 mg/dL (ref 0.0–1.0)
pH: 7 (ref 5.0–8.0)

## 2010-03-25 LAB — URINALYSIS, ROUTINE W REFLEX MICROSCOPIC
Bilirubin Urine: NEGATIVE
Glucose, UA: NEGATIVE mg/dL
Hgb urine dipstick: NEGATIVE
Ketones, ur: NEGATIVE mg/dL
Nitrite: NEGATIVE
Protein, ur: NEGATIVE mg/dL
Specific Gravity, Urine: 1.017 (ref 1.005–1.030)
Urobilinogen, UA: 0.2 mg/dL (ref 0.0–1.0)
pH: 6.5 (ref 5.0–8.0)

## 2010-03-25 LAB — POCT I-STAT, CHEM 8
BUN: 15 mg/dL (ref 6–23)
Calcium, Ion: 1.1 mmol/L — ABNORMAL LOW (ref 1.12–1.32)
Chloride: 106 mEq/L (ref 96–112)
Creatinine, Ser: 1.1 mg/dL (ref 0.4–1.2)
Glucose, Bld: 105 mg/dL — ABNORMAL HIGH (ref 70–99)
HCT: 41 % (ref 36.0–46.0)
Hemoglobin: 13.9 g/dL (ref 12.0–15.0)
Potassium: 3.6 mEq/L (ref 3.5–5.1)
Sodium: 140 mEq/L (ref 135–145)
TCO2: 26 mmol/L (ref 0–100)

## 2010-03-30 LAB — BASIC METABOLIC PANEL
BUN: 11 mg/dL (ref 6–23)
CO2: 28 mEq/L (ref 19–32)
Calcium: 9.2 mg/dL (ref 8.4–10.5)
Chloride: 103 mEq/L (ref 96–112)
Creatinine, Ser: 0.93 mg/dL (ref 0.4–1.2)
GFR calc Af Amer: >60 mL/min (ref >60)
GFR calc non Af Amer: >60 mL/min (ref >60)
Glucose, Bld: 107 mg/dL — ABNORMAL HIGH (ref 70–99)
Potassium: 3.3 mEq/L — ABNORMAL LOW (ref 3.5–5.1)
Sodium: 138 mEq/L (ref 135–145)

## 2010-03-30 LAB — CBC
HCT: 42 % (ref 36.0–46.0)
Hemoglobin: 13.3 g/dL (ref 12.0–15.0)
MCHC: 31.6 g/dL (ref 30.0–36.0)
MCV: 72.6 fL — ABNORMAL LOW (ref 78.0–100.0)
Platelets: 228 10*3/uL (ref 150–400)
RBC: 5.78 MIL/uL — ABNORMAL HIGH (ref 3.87–5.11)
RDW: 14.9 % (ref 11.5–15.5)
WBC: 9.3 10*3/uL (ref 4.0–10.5)

## 2010-03-30 LAB — DIFFERENTIAL
Basophils Absolute: 0 10*3/uL (ref 0.0–0.1)
Basophils Relative: 0 % (ref 0–1)
Eosinophils Absolute: 0.4 10*3/uL (ref 0.0–0.7)
Eosinophils Relative: 4 % (ref 0–5)
Lymphocytes Relative: 41 % (ref 12–46)
Lymphs Abs: 3.8 10*3/uL (ref 0.7–4.0)
Monocytes Absolute: 0.6 10*3/uL (ref 0.1–1.0)
Monocytes Relative: 6 % (ref 3–12)
Neutro Abs: 4.4 10*3/uL (ref 1.7–7.7)
Neutrophils Relative %: 48 % (ref 43–77)

## 2010-04-01 NOTE — Progress Notes (Signed)
Summary: Rx request  Phone Note Call from Patient Call back at Home Phone [phone]   Caller: Patient Summary of Call: Pt called requesting Rx for diarrhea and upset stomach (see previous phone note). Pt states that diarrhea will prevent her from OV today, please advise. Initial call taken by: Margaret Pyle, CMA,  March 24, 2010 11:32 AM  Follow-up for Phone Call        promethazine erx done - also use otc immodium ad as needed (see directions below in med list ) - thanks Follow-up by: Newt Lukes MD,  March 24, 2010 12:16 PM  Additional Follow-up for Phone Call Additional follow up Details #1::        Pt advised of above via VM Additional Follow-up by: Margaret Pyle, CMA,  March 24, 2010 1:37 PM    New/Updated Medications: PROMETHAZINE HCL 25 MG TABS (PROMETHAZINE HCL) 1 by mouth every 4-6 hours as needed nausea or vomitting symptoms IMODIUM A-D 2 MG TABS (LOPERAMIDE HCL) 1-2 by mouth every 6 hours as needed for diarrhea symptoms Prescriptions: PROMETHAZINE HCL 25 MG TABS (PROMETHAZINE HCL) 1 by mouth every 4-6 hours as needed nausea or vomitting symptoms  #20 x 0   Entered and Authorized by:   Newt Lukes MD   Signed by:   Newt Lukes MD on 03/24/2010   Method used:   Electronically to        [facility] (retail)       [address]       [place], [place]  [address]       Ph: [phone]       Fax: [phone]   RxID:   [id number]

## 2010-04-01 NOTE — Progress Notes (Signed)
Summary: Call Report  Phone Note Other Incoming   Caller: [facility] Summary of Call: [facility] Triage Call Report Triage Record Num: [id number] Operator: Tomasita Crumble Patient Name: Alexis Wheeler Call Date & Time: 03/23/2010 8:27:20PM Patient Phone: [phone] PCP: Rene Paci Patient Gender: Female PCP Fax : [phone] Patient DOB: 08-20-1955 Practice Name: [facility] Reason for Call: Pt. calling about Diarrhea. Onset 3/9. Reports 4-5 episodes per day. No vomiting. Afebrile/subjective. Eating ginger ale, crackers. Advised home care measures and follow up with MD in 24 hours per Diarrhea protocol. Protocol(s) Used: Diarrhea or Other Change in Bowel Habits Recommended Outcome per Protocol: See Provider within 24 hours Reason for Outcome: Diarrhea lasting longer than 24 hours AND not improving with home care Care Advice:  ~ SYMPTOM / CONDITION MANAGEMENT Go to the ED if you have developed signs and symptoms of dehydration such as very dry mouth and tongue; increased pulse rate at rest; no urine output for 8 hours or more; increasing weakness or drowsiness, or lightheadedness when trying to sit upright or standing.  ~ Diarrheal Care: - Drink 2-3 quarts (2-3 liters) per day of low sugar content fluids, including over the counter oral hydration solution, unless directed otherwise by provider. - If accompanied by vomiting, take the fluids in frequent small sips or suck on ice chips. - Eat easily digested foods (such as bananas, rice, applesauce, toast, cooked cereals, soup, crackers, baked or boiled potato, or baked chicken or [place] without skin). - Do not eat high fiber, high fat, high sugar content foods, or highly seasoned foods. - Do not drink caffeinated or alcoholic beverages. - Avoid milk and milk products while having symptoms. As symptoms improve, gradually add back to diet. - Application of A&D ointment or witch hazel medicated pads may help anal  irritation. - Antidiarrheal medications are usually unnecessary. If symptoms are severe, consider nonprescription antidiarrheal and anti-motility drugs as  Initial call taken by: Margaret Pyle, CMA,  March 24, 2010 8:23 AM  Follow-up for Phone Call        noted - thx Follow-up by: Newt Lukes MD,  March 24, 2010 10:24 AM

## 2010-04-02 ENCOUNTER — Encounter: Payer: Self-pay | Admitting: *Deleted

## 2010-04-29 ENCOUNTER — Ambulatory Visit: Payer: Self-pay | Admitting: Internal Medicine

## 2010-05-05 ENCOUNTER — Ambulatory Visit: Payer: Self-pay | Admitting: Internal Medicine

## 2010-05-09 ENCOUNTER — Encounter: Payer: Self-pay | Admitting: Internal Medicine

## 2010-05-12 ENCOUNTER — Encounter: Payer: Self-pay | Admitting: Internal Medicine

## 2010-05-12 ENCOUNTER — Ambulatory Visit (INDEPENDENT_AMBULATORY_CARE_PROVIDER_SITE_OTHER): Payer: PRIVATE HEALTH INSURANCE | Admitting: Internal Medicine

## 2010-05-12 ENCOUNTER — Telehealth: Payer: Self-pay | Admitting: Internal Medicine

## 2010-05-12 ENCOUNTER — Other Ambulatory Visit (INDEPENDENT_AMBULATORY_CARE_PROVIDER_SITE_OTHER): Payer: PRIVATE HEALTH INSURANCE

## 2010-05-12 VITALS — BP 140/96 | HR 75 | Temp 98.6°F | Ht 67.5 in | Wt 201.0 lb

## 2010-05-12 DIAGNOSIS — I10 Essential (primary) hypertension: Secondary | ICD-10-CM

## 2010-05-12 DIAGNOSIS — K219 Gastro-esophageal reflux disease without esophagitis: Secondary | ICD-10-CM

## 2010-05-12 LAB — LIPID PANEL
Cholesterol: 166 mg/dL (ref 0–200)
HDL: 47.4 mg/dL (ref >39.00)
LDL Cholesterol: 105 mg/dL — ABNORMAL HIGH (ref 0–99)
Total CHOL/HDL Ratio: 4
Triglycerides: 70 mg/dL (ref 0.0–149.0)
VLDL: 14 mg/dL (ref 0.0–40.0)

## 2010-05-12 LAB — CREATININE, SERUM: Creatinine, Ser: 0.8 mg/dL (ref 0.4–1.2)

## 2010-05-12 NOTE — Progress Notes (Signed)
  Subjective:    Patient ID: Alexis Wheeler, female    DOB: May 17, 1955, 55 y.o.   MRN: [id number]  HPI  here for follow up   HTN - onset mid20s - on meds since that time - prior tx includes dyazide (remains on same), toprol (caused fatigue), amlodipine, benicar (caused HA) and benazapril (recent dose increase) a/w HA when BP elevated, mild chronic edema no assoc CP or vision changes - not improved with low salt diet no known kidney problems but reports chronic recurrent hematuria syncope 01/2009 from low BP - meds adjusted -  tol current combo well, no further syncope  GERD - episodes 1-2x/week.  takes OTC ppi meds for same with partial relief - no abd pain, no nausea, vomitting or change in bowels - denies relief from dietary changes, some improved with position change (elevation of head of bed) - reports plans for prior egd to eval same but unable to f/u with gi due to missed appt and dismissal from practice (mann) -   LBP - chronic - no weakness, no numbness, no difficulty walking - improved with heat and using meds form ER (not used at time of fall when rx'd)    Past Medical History  Diagnosis Date  . Morbid obesity   . HEMATURIA UNSPECIFIED   . GERD   . MYOSITIS   . HYPERTENSION   . HYPERLIPIDEMIA     Review of Systems  Respiratory: Negative for shortness of breath.   Cardiovascular: Negative for chest pain.  Musculoskeletal: Negative for joint swelling.  Neurological: Negative for dizziness and headaches.  Psychiatric/Behavioral: Negative for confusion.       Objective:   Physical Exam BP 140/96  Pulse 75  Temp(Src) 98.6 F (37 C) (Oral)  Ht 5' 7.5" (1.715 m)  Wt 201 lb (91.173 kg)  BMI 31.02 kg/m2  SpO2 99% Physical Exam  Constitutional: She is oriented to person, place, and time. She appears well-developed and well-nourished. No distress.  Neck: Normal range of motion. Neck supple. No JVD present. No thyromegaly present.  Cardiovascular: Normal rate,  regular rhythm and normal heart sounds.  No murmur heard. No BLE edema. Pulmonary/Chest: Effort normal and breath sounds normal. No respiratory distress. She has no wheezes. Neurological: She is alert and oriented to person, place, and time. No cranial nerve deficit. Coordination normal.  Skin: Skin is warm and dry. No rash noted. No erythema.  Psychiatric: She has a normal mood and affect. Her behavior is normal. Judgment and thought content normal.       Lab Results  Component Value Date   WBC 9.3 06/28/2009   HGB 15.6* 01/11/2010   HCT 46.0 01/11/2010   PLT 228 06/28/2009   NA 141 01/11/2010   K 3.5 01/11/2010   CL 102 01/11/2010   CREATININE 1.0 01/11/2010   BUN 16 01/11/2010   CO2 28 11/28/2009    Assessment & Plan:  See problem list. Medications and labs reviewed today.

## 2010-05-12 NOTE — Telephone Encounter (Signed)
Pt Notified with lab results.Marland KitchenMarland Kitchen4/30/12@3 :05pm/LMB

## 2010-05-12 NOTE — Patient Instructions (Signed)
It was good to see you today. Test(s) ordered today. Your results will be called to you after review (48-72hours after test completion). If any changes need to be made, you will be notified at that time. Medications reviewed, no changes at this time. Please schedule followup in 3-4 months, call sooner if problems.  

## 2010-05-12 NOTE — Telephone Encounter (Signed)
Please call patient - normal or stable test results. No medication changes recommended. Thanks.

## 2010-05-12 NOTE — Assessment & Plan Note (Signed)
Stable symptoms on OTC PPI - no red flags on hx or abnormal exam findings The current medical regimen is effective;  continue present plan and medications.

## 2010-05-12 NOTE — Assessment & Plan Note (Signed)
Multiple intolerances - toprol, ARB - higher dose amlodipine caused SE Check labs now and consider additional therapy as needed BP Readings from Last 3 Encounters:  05/12/10 140/96  01/28/10 142/82  01/14/10 142/110

## 2010-05-12 NOTE — Assessment & Plan Note (Signed)
Reviewed again need for weight loss through diet and exercise changes -  Wt Readings from Last 3 Encounters:  05/12/10 201 lb (91.173 kg)  01/28/10 202 lb (91.627 kg)  01/14/10 202 lb 6.4 oz (91.808 kg)

## 2010-05-30 NOTE — Op Note (Signed)
NAME:  Alexis Wheeler, Alexis Wheeler           ACCOUNT NO.:  [id number]   MEDICAL RECORD NO.:  [id number]          PATIENT TYPE:  AMB   LOCATION:  ENDO                         FACILITY:  [facility]   PHYSICIAN:  Danise Edge, M.D.   DATE OF BIRTH:  03/04/1955   DATE OF PROCEDURE:  04/29/2004  DATE OF DISCHARGE:                                 OPERATIVE REPORT   PROCEDURE:  Colonoscopy.   INDICATIONS:  [name] is a 55 year old female born [birth year]. 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 Her  symptoms are associated with abdominal bloating. She denies gastrointestinal  bleeding.   ENDOSCOPIST:  Danise Edge, M.D.   PREMEDICATION:  Versed 7 mg, Demerol 70 mg.   DESCRIPTION OF PROCEDURE:  After obtaining informed consent, [name]  was placed in the left lateral decubitus position. I administered  intravenous Demerol and intravenous Versed to achieve conscious sedation for  the procedure. The patient's blood pressure, oxygen saturation and cardiac  rhythm were monitored throughout the procedure and documented in the medical  record.   Anal inspection and digital rectal exam were normal. The Olympus adjustable  pediatric colonoscope was introduced into the rectum and advanced to the  cecum. A normal-appearing appendiceal orifice and ileocecal valve were  identified. Colonic preparation for the exam today was excellent.   RECTUM:  Normal.  SIGMOID COLON AND DESCENDING COLON:  Normal.  SPLENIC FLEXURE:  Normal.  TRANSVERSE COLON:  Normal.  HEPATIC FLEXURE:  Normal.  ASCENDING COLON:  Normal.  CECUM AND ILEOCECAL VALVE:  Normal.   RANDOM COLONIC BIOPSIES:  Three biopsies were taken from the right colon and  three biopsies were taken from the left colon. All biopsies were put in one  pathology bottle to rule out microscopic - collagenous colitis.   ASSESSMENT:  Normal proctocolonoscopy to the cecum. Random colonic biopsies  rule out microscopic -  collagenous colitis pending.      MJ/MEDQ  D:  04/29/2004  T:  04/29/2004  Job:  [id number]   cc:   Georgann Housekeeper, MD  [address]  [place]  [place] [address]  Fax: [phone]

## 2010-05-30 NOTE — Discharge Summary (Signed)
[place]. [facility]  Patient:    Alexis Wheeler, Alexis Wheeler                  MRN: [id number] Adm. Date:  [id number] Disc. Date: [id number] Attending:  Gwenyth Bender                           Discharge Summary  FINAL DIAGNOSES: 1. Chest pain, noncardiac. 2. Hypokalemia, symptomatic. 3. Hypertension, stabilized. 4. Hyperlipidemia. 5. Mild myositis.  OPERATIONS/PROCEDURES:  Adenosine Cardiolite per Dr. Algie Coffer.  HISTORY OF PRESENT ILLNESS:  The patient is a 55 year old married black female who was doing well until approximately 1:30 on the day of admission.  The patient was on the job doing her usual activities when she experienced substernal pressure pain with dyspnea and diaphoresis.  There was no associated nausea, vomiting, or palpitations.  Denied any abdominal pain or reflux.  Her blood pressure was noted to be elevated at the nursing home.  The patient subsequently came to the emergency room for further evaluation.  She was admitted thereafter.  PAST MEDICAL HISTORY:  Per admission H&P.  HOSPITAL COURSE:  The patient was admitted for further evaluation of new onset of chest pain.  Her features were not completely typical for anginal pain, but this could not be excluded.  She was noted to be also hypokalemic at the time of admission with potassium 3.3.  CK was elevated at 427 with MB of 1.3.  The patient was admitted to telemetry.  She was started on IV nitroglycerin with rule out myocardial infarction.  Her potassium was replenished as well.  The patient was seen in consultation by Dr. Algie Coffer.  He also agreed that her features were suggestive of angina.  After her potassium was corrected, she underwent an adenosine Cardiolite study on September 27, 1999.  This subsequently showed no evidence for underlying coronary artery disease.  The patient was feeling better thereafter.  Her potassium was improving.  She was subsequently thought to be stable for  discharge home with further outpatient management.  DISCHARGE MEDICATIONS: 1. K-Dur 20 mEq 2 p.o. q.d. 2. Norvasc 5 mg q.d. 3. Claritin 10 mg q.d. 4. Toprol XL 50 mg 1/2 tablet q.d. 5. Dyazide 1 p.o. q.d.  ACTIVITY:  As tolerated.  DIET:  She will be maintained on a low-fat, low-sodium diet.  FOLLOW-UP:  In the office in two weeks time. DD:  10/29/99 TD:  10/29/99 Job: [id number]

## 2010-05-30 NOTE — Op Note (Signed)
NAME:  Alexis Wheeler, Alexis Wheeler           ACCOUNT NO.:  [id number]   MEDICAL RECORD NO.:  [id number]          PATIENT TYPE:  AMB   LOCATION:  [facility]                         FACILITY:  [facility]   PHYSICIAN:  Boston Service, M.D.DATE OF BIRTH:  1955-02-13   DATE OF PROCEDURE:  04/14/2005  DATE OF DISCHARGE:                                 OPERATIVE REPORT   PREOPERATIVE DIAGNOSIS:  56 year old female with microhematuria, left  costovertebral angle tenderness.  CT scan April 06, 2005, and cystoscopy  showed four nonobstructing calculi within the left kidney. After reviewing  films with the patient, she has had persistent intermittent left-sided  abdominal pain. Careful medical evaluation with Dr. Donette Larry revealed no other  underlying source to explain her left-sided abdominal tenderness.  I had a  lengthy discussion with the patient about the options available to [place].  Current films show a 6 mm, 4 mm, 2 mm, and 1 mm calculus within the left  kidney.  The patient understands that given the clinical information present  to [place] at the time that we believe there is a good possibility that these  calculi are intermittently causing her symptoms but that fragmentation and  passage of the calculi may not completely resolve her left-sided abdominal  pain.  I have discussed situation with Dr. Donette Larry by phone and the plan at  this point is double-J stent with follow-up ESWL hopefully with removal of  double-J stent several days after ESWL.   POSTOPERATIVE DIAGNOSIS:  55 year old female with microhematuria, left  costovertebral angle tenderness.  CT scan April 06, 2005, and cystoscopy  showed four nonobstructing calculi within the left kidney. After reviewing  films with the patient, she has had persistent intermittent left-sided  abdominal pain. Careful medical evaluation with Dr. Donette Larry revealed no other  underlying source to explain her left-sided abdominal tenderness.  I had a  lengthy discussion with the  patient about the options available to [place].  Current films show a 6 mm, 4 mm, 2 mm, and 1 mm calculus within the left  kidney.  The patient understands that given the clinical information present  to [place] at the time that we believe there is a good possibility that these  calculi are intermittently causing her symptoms but that fragmentation and  passage of the calculi may not completely resolve her left-sided abdominal  pain.  I have discussed situation with Dr. Donette Larry by phone and the plan at  this point is double-J stent with follow-up ESWL hopefully with removal of  double-J stent several days after ESWL.   PROCEDURE:  Cystoscopy, retrograde double-J stent.   ANESTHESIA:  General.   DRAINS:  6-French 26 cm double-J stent.   DESCRIPTION OF PROCEDURE:  The patient was prepped and draped in the  dorsolithotomy position after institution of an adequate level of general  anesthesia.  The bladder showed mild descensus consistent with cystocele and  previous hysterectomy.  The blocking catheter was positioned at the right  ureteral orifice with gentle injection of 3-5 mL of contrast outlined the  ureter, pelvis and calyces with prompt drainage at 3-5 minutes.  No evidence  of stone  or obstruction on the right.  A similar technique was used on the  left side with gentle injection of contrast. Stones outlined in the mid and  lower pole calices.  There was some fullness to the pelvis and calices but  no obvious point of obstruction.  A floppy tip guidewire was then inserted  at the left ureteral orifice, advanced into the upper pole calices.  A 6-  French 26 cm double-J stent was selected, passed over the guidewire with  excellent pigtail formation on guidewire removal.  There was prompt efflux  of pink urine through the fenestration of the double-J  stent raising the possibility of some postobstructive diuresis and/or  intermittent obstruction on that side.  The double-J stent was left   indwelling.  The bladder was drained.  The patient was given a BNO  suppository and returned to recovery in satisfactory condition.           ______________________________  Boston Service, M.D.     RH/MEDQ  D:  04/14/2005  T:  04/14/2005  Job:  [id number]   cc:   Georgann Housekeeper, MD  Fax: [phone]

## 2010-08-11 ENCOUNTER — Ambulatory Visit: Payer: PRIVATE HEALTH INSURANCE | Admitting: Internal Medicine

## 2010-09-19 ENCOUNTER — Other Ambulatory Visit: Payer: Self-pay | Admitting: Internal Medicine

## 2010-10-08 ENCOUNTER — Encounter: Payer: Self-pay | Admitting: Internal Medicine

## 2010-10-08 ENCOUNTER — Other Ambulatory Visit (INDEPENDENT_AMBULATORY_CARE_PROVIDER_SITE_OTHER): Payer: BC Managed Care – PPO

## 2010-10-08 ENCOUNTER — Ambulatory Visit (INDEPENDENT_AMBULATORY_CARE_PROVIDER_SITE_OTHER): Payer: BC Managed Care – PPO | Admitting: Internal Medicine

## 2010-10-08 ENCOUNTER — Other Ambulatory Visit: Payer: Self-pay | Admitting: Internal Medicine

## 2010-10-08 VITALS — BP 132/82 | HR 80 | Temp 98.3°F | Ht 66.0 in | Wt 200.8 lb

## 2010-10-08 DIAGNOSIS — R1084 Generalized abdominal pain: Secondary | ICD-10-CM

## 2010-10-08 DIAGNOSIS — I10 Essential (primary) hypertension: Secondary | ICD-10-CM

## 2010-10-08 DIAGNOSIS — K5904 Chronic idiopathic constipation: Secondary | ICD-10-CM

## 2010-10-08 DIAGNOSIS — M5430 Sciatica, unspecified side: Secondary | ICD-10-CM

## 2010-10-08 DIAGNOSIS — K5909 Other constipation: Secondary | ICD-10-CM

## 2010-10-08 DIAGNOSIS — M5432 Sciatica, left side: Secondary | ICD-10-CM

## 2010-10-08 LAB — URINALYSIS, ROUTINE W REFLEX MICROSCOPIC
Bilirubin Urine: NEGATIVE
Ketones, ur: NEGATIVE
Leukocytes, UA: NEGATIVE
Nitrite: NEGATIVE
Specific Gravity, Urine: >=1.03 (ref 1.000–1.030)
Total Protein, Urine: NEGATIVE
Urine Glucose: NEGATIVE
Urobilinogen, UA: 0.2 (ref 0.0–1.0)
pH: 6 (ref 5.0–8.0)

## 2010-10-08 LAB — BASIC METABOLIC PANEL
BUN: 15 mg/dL (ref 6–23)
CO2: 30 mEq/L (ref 19–32)
Calcium: 9.3 mg/dL (ref 8.4–10.5)
Chloride: 106 mEq/L (ref 96–112)
Creatinine, Ser: 0.8 mg/dL (ref 0.4–1.2)
GFR: 101.22 mL/min (ref >60.00)
Glucose, Bld: 70 mg/dL (ref 70–99)
Potassium: 3.5 mEq/L (ref 3.5–5.1)
Sodium: 142 mEq/L (ref 135–145)

## 2010-10-08 LAB — HEPATIC FUNCTION PANEL
ALT: 19 U/L (ref 0–35)
AST: 18 U/L (ref 0–37)
Albumin: 3.8 g/dL (ref 3.5–5.2)
Alkaline Phosphatase: 70 U/L (ref 39–117)
Bilirubin, Direct: 0.1 mg/dL (ref 0.0–0.3)
Total Bilirubin: 0.5 mg/dL (ref 0.3–1.2)
Total Protein: 7 g/dL (ref 6.0–8.3)

## 2010-10-08 LAB — LIPID PANEL
Cholesterol: 169 mg/dL (ref 0–200)
HDL: 51.9 mg/dL (ref >39.00)
LDL Cholesterol: 101 mg/dL — ABNORMAL HIGH (ref 0–99)
Total CHOL/HDL Ratio: 3
Triglycerides: 79 mg/dL (ref 0.0–149.0)
VLDL: 15.8 mg/dL (ref 0.0–40.0)

## 2010-10-08 LAB — CBC WITH DIFFERENTIAL/PLATELET
Basophils Absolute: 0 10*3/uL (ref 0.0–0.1)
Basophils Relative: 0.4 % (ref 0.0–3.0)
Eosinophils Absolute: 0.3 10*3/uL (ref 0.0–0.7)
Eosinophils Relative: 3.5 % (ref 0.0–5.0)
HCT: 40 % (ref 36.0–46.0)
Hemoglobin: 12.7 g/dL (ref 12.0–15.0)
Lymphocytes Relative: 42 % (ref 12.0–46.0)
Lymphs Abs: 3.3 10*3/uL (ref 0.7–4.0)
MCHC: 31.8 g/dL (ref 30.0–36.0)
MCV: 72.6 fl — ABNORMAL LOW (ref 78.0–100.0)
Monocytes Absolute: 0.8 10*3/uL (ref 0.1–1.0)
Monocytes Relative: 9.5 % (ref 3.0–12.0)
Neutro Abs: 3.5 10*3/uL (ref 1.4–7.7)
Neutrophils Relative %: 44.6 % (ref 43.0–77.0)
Platelets: 230 10*3/uL (ref 150.0–400.0)
RBC: 5.51 Mil/uL — ABNORMAL HIGH (ref 3.87–5.11)
RDW: 15.5 % — ABNORMAL HIGH (ref 11.5–14.6)
WBC: 7.9 10*3/uL (ref 4.5–10.5)

## 2010-10-08 LAB — TSH: TSH: 0.99 u[IU]/mL (ref 0.35–5.50)

## 2010-10-08 MED ORDER — POLYETHYLENE GLYCOL 3350 17 GM/SCOOP PO POWD: 17.0000 g | Freq: Every day | ORAL | Status: AC

## 2010-10-08 MED ORDER — PREDNISONE (PAK) 10 MG PO TABS: 10.0000 mg | ORAL_TABLET | ORAL | Status: AC

## 2010-10-08 NOTE — Progress Notes (Signed)
Subjective:    Patient ID: Alexis Wheeler, female    DOB: January 22, 1955, 54 y.o.   MRN: [id number]  HPI   here for abdominal pain - exac by chronic constipatation - last BM yesterday but always "hard small pellets unless take a laxative"  HTN - onset mid20s - on meds since that time - prior tx includes dyazide (remains on same), toprol (caused fatigue), amlodipine, benicar (caused HA) and benazapril (recent dose increase) associated with HA when BP elevated, mild chronic edema no associated CP or vision changes - not improved with low salt diet no known kidney problems but reports chronic recurrent hematuria syncope 01/2009 from low BP - meds adjusted -  tol current combo well, no further syncope  GERD - episodes 1-2x/week.  takes OTC ppi meds for same with partial relief - no abd pain, no nausea, vomitting or change in bowels - denies relief from dietary changes, some improved with position change (elevation of head of bed) - reports plans for prior egd to eval same but unable to f/u with gi due to missed appt and dismissal from practice (mann) -   LBP - chronic - no weakness, no numbness, no difficulty walking - improved with heat and using meds form ER (not used at time of fall early spring 2012) - exac of symptoms in past 2 weeks with radiation down L lateral thigh   Past Medical History  Diagnosis Date  . Morbid obesity   . HEMATURIA UNSPECIFIED   . GERD   . MYOSITIS   . HYPERTENSION   . HYPERLIPIDEMIA     Review of Systems  Respiratory: Negative for shortness of breath.   Cardiovascular: Negative for chest pain.  Musculoskeletal: Negative for joint swelling.  Neurological: Negative for dizziness and headaches.  Psychiatric/Behavioral: Negative for confusion.       Objective:   Physical Exam  BP 132/82  Pulse 80  Temp(Src) 98.3 F (36.8 C) (Oral)  Ht 5\' 6"  (1.676 m)  Wt 200 lb 12.8 oz (91.082 kg)  BMI 32.41 kg/m2  SpO2 99% Wt Readings from Last 3 Encounters:    10/08/10 200 lb 12.8 oz (91.082 kg)  05/12/10 201 lb (91.173 kg)  01/28/10 202 lb (91.627 kg)   Constitutional: She is overweight; She appears well-developed and well-nourished. No distress.  Neck: Normal range of motion. Neck supple. No JVD present. No thyromegaly present.  Cardiovascular: Normal rate, regular rhythm and normal heart sounds.  No murmur heard. No BLE edema. Resp: Effort normal and breath sounds normal. No respiratory distress. She has no wheezes. Abd: obese, SNTND, +BS, no R/G, no mass Mskel: Back: full range of motion of thoracic and lumbar spine. Non tender to palpation. Negative straight leg raise. DTR's are symmetrically intact. Sensation intact in all dermatomes of the lower extremities. Full strength to manual muscle testing - able to heel toe walk without difficulty and ambulates with antalgic gait. Neurological: She is alert and oriented to person, place, and time. No cranial nerve deficit. Coordination normal.  Skin: Skin is warm and dry. No rash noted. No erythema.  Psychiatric: She has a normal mood and affect. Her behavior is normal. Judgment and thought content normal.       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

## 2010-10-08 NOTE — Assessment & Plan Note (Signed)
Multiple intolerances - toprol, ARB - higher dose amlodipine caused SE The current medical regimen is effective;  continue present plan and medications.  BP Readings from Last 3 Encounters:  10/08/10 132/82  05/12/10 140/96  01/28/10 142/82

## 2010-10-08 NOTE — Patient Instructions (Signed)
It was good to see you today. Test(s) ordered today. Your results will be called to you after review (48-72hours after test completion). If any changes need to be made, you will be notified at that time. Use pred pak for your leg pain and Miralax for your constipation - Your prescription(s) have been submitted to your pharmacy. Please take as directed and contact our office if you believe you are having problem(s) with the medication(s). Other Medications reviewed, no other changes at this time. Please schedule followup in 3-4 months for blood pressure and weight check, call sooner if problems or if leg/stomach pains worse.

## 2010-11-04 ENCOUNTER — Emergency Department (HOSPITAL_COMMUNITY): Payer: BC Managed Care – PPO

## 2010-11-04 ENCOUNTER — Encounter (HOSPITAL_COMMUNITY): Payer: Self-pay

## 2010-11-04 ENCOUNTER — Emergency Department (HOSPITAL_COMMUNITY)
Admission: EM | Admit: 2010-11-04 | Discharge: 2010-11-04 | Disposition: A | Discharge status: Home / Self Care | Payer: BC Managed Care – PPO | Attending: Emergency Medicine | Admitting: Emergency Medicine

## 2010-11-04 DIAGNOSIS — Z79899 Other long term (current) drug therapy: Secondary | ICD-10-CM | POA: Insufficient documentation

## 2010-11-04 DIAGNOSIS — N200 Calculus of kidney: Secondary | ICD-10-CM | POA: Insufficient documentation

## 2010-11-04 DIAGNOSIS — K219 Gastro-esophageal reflux disease without esophagitis: Secondary | ICD-10-CM | POA: Insufficient documentation

## 2010-11-04 DIAGNOSIS — I10 Essential (primary) hypertension: Secondary | ICD-10-CM | POA: Insufficient documentation

## 2010-11-04 LAB — URINE MICROSCOPIC-ADD ON

## 2010-11-04 LAB — URINALYSIS, ROUTINE W REFLEX MICROSCOPIC
Bilirubin Urine: NEGATIVE
Glucose, UA: NEGATIVE mg/dL
Ketones, ur: NEGATIVE mg/dL
Leukocytes, UA: NEGATIVE
Nitrite: NEGATIVE
Protein, ur: >300 mg/dL — AB
Specific Gravity, Urine: 1.023 (ref 1.005–1.030)
Urobilinogen, UA: 0.2 mg/dL (ref 0.0–1.0)
pH: 5.5 (ref 5.0–8.0)

## 2010-11-04 LAB — CBC
HCT: 39.9 % (ref 36.0–46.0)
Hemoglobin: 12.8 g/dL (ref 12.0–15.0)
MCH: 22.7 pg — ABNORMAL LOW (ref 26.0–34.0)
MCHC: 32.1 g/dL (ref 30.0–36.0)
MCV: 70.7 fL — ABNORMAL LOW (ref 78.0–100.0)
Platelets: 223 10*3/uL (ref 150–400)
RBC: 5.64 MIL/uL — ABNORMAL HIGH (ref 3.87–5.11)
RDW: 14.9 % (ref 11.5–15.5)
WBC: 6.8 10*3/uL (ref 4.0–10.5)

## 2010-11-04 LAB — POCT I-STAT, CHEM 8
BUN: 21 mg/dL (ref 6–23)
Calcium, Ion: 1.15 mmol/L (ref 1.12–1.32)
Chloride: 100 mEq/L (ref 96–112)
Creatinine, Ser: 0.9 mg/dL (ref 0.50–1.10)
Glucose, Bld: 114 mg/dL — ABNORMAL HIGH (ref 70–99)
HCT: 43 % (ref 36.0–46.0)
Hemoglobin: 14.6 g/dL (ref 12.0–15.0)
Potassium: 4.1 mEq/L (ref 3.5–5.1)
Sodium: 138 mEq/L (ref 135–145)
TCO2: 29 mmol/L (ref 0–100)

## 2010-11-04 LAB — DIFFERENTIAL
Basophils Absolute: 0 10*3/uL (ref 0.0–0.1)
Basophils Relative: 0 % (ref 0–1)
Eosinophils Absolute: 0.1 10*3/uL (ref 0.0–0.7)
Eosinophils Relative: 1 % (ref 0–5)
Lymphocytes Relative: 21 % (ref 12–46)
Lymphs Abs: 1.4 10*3/uL (ref 0.7–4.0)
Monocytes Absolute: 0.4 10*3/uL (ref 0.1–1.0)
Monocytes Relative: 5 % (ref 3–12)
Neutro Abs: 5 10*3/uL (ref 1.7–7.7)
Neutrophils Relative %: 73 % (ref 43–77)

## 2010-11-10 ENCOUNTER — Ambulatory Visit (HOSPITAL_COMMUNITY)
Admission: RE | Admit: 2010-11-10 | Discharge: 2010-11-10 | Disposition: A | Discharge status: Home / Self Care | Payer: BC Managed Care – PPO | Source: Ambulatory Visit | Attending: Urology | Admitting: Urology

## 2010-11-10 DIAGNOSIS — N1330 Unspecified hydronephrosis: Secondary | ICD-10-CM | POA: Insufficient documentation

## 2010-11-10 DIAGNOSIS — Z01818 Encounter for other preprocedural examination: Secondary | ICD-10-CM | POA: Insufficient documentation

## 2010-11-10 DIAGNOSIS — G4730 Sleep apnea, unspecified: Secondary | ICD-10-CM | POA: Insufficient documentation

## 2010-11-10 DIAGNOSIS — I10 Essential (primary) hypertension: Secondary | ICD-10-CM | POA: Insufficient documentation

## 2010-11-10 DIAGNOSIS — N200 Calculus of kidney: Secondary | ICD-10-CM | POA: Insufficient documentation

## 2010-11-19 NOTE — Consult Note (Signed)
NAME:  Alexis Wheeler, Alexis Wheeler NO.:  [id number]  MEDICAL RECORD NO.:  [id number]  LOCATION:  [facility]                         FACILITY:  [facility]  PHYSICIAN:  Jerilee Field, MD   DATE OF BIRTH:  [birth year]  DATE OF CONSULTATION: DATE OF DISCHARGE:  11/04/2010                                CONSULTATION   REFERRING PHYSICIAN:  April Palumbo-Rasch, MD.  REASON FOR CONSULT:  Left proximal ureteral stone and left hydronephrosis.  HISTORY OF PRESENT ILLNESS:  Alexis Wheeler is a 55 year old female who presented with the acute onset of left flank pain.  Looking back, she has had some minimal to moderate left-sided flank pain and that was intermittent for about a week.  However last night, she had an acute increase in the pain level up to a 10/10 in the left flank.  She also had nausea, but no fevers, chills, or vomiting.  The pain has been radiating into the left abdomen and it was colicky in nature.  The patient does have a history of kidney stones and underwent shock-wave lithotripsy approximately 5 years ago.  She denied dysuria or hematuria.  PAST MEDICAL HISTORY: 1. Hypertension. 2. GERD. 3. Nephrolithiasis.  FAMILY HISTORY:  Hypertension and cancer.  PAST SURGICAL HISTORY:  Cholecystectomy and hysterectomy.  SOCIAL HISTORY:  Patient is a Engineer, civil (consulting).  She is a nonsmoker and a nondrinker.  ALLERGIES:  IBUPROFEN, which causes nausea and vomiting.  MEDICATIONS:  Lisinopril, Norvasc, hydrochlorothiazide, and potassium chloride.  REVIEW OF SYSTEMS:  Ten system review of systems obtained, which was negative except as noted in the history of present illness.  PHYSICAL EXAM:  VITAL SIGNS:  Patient's temperature was 97.9, blood pressure 149/87, pulse was 64, respiration 20.  Pain scale had come down to 5/10, and the patient was satting 100% on room air. CONSTITUTIONAL:  She was well-developed and well-nourished.  She was in no acute distress, talking with her  family. NEUROLOGIC:  There was no focal deficit.  She was alert and oriented x3. HEENT:  Head was normocephalic.  Eyes showed no scleral icterus. NECK:  Supple. CARDIOVASCULAR:  Revealed a regular rate and rhythm. RESPIRATORY:  She had normal respiratory rate and effort. ABDOMEN:  Soft, nontender, nondistended. EXTREMITIES:  Showed no edema. SKIN:  Appeared normal without rash.  DIAGNOSTIC DATA:  CT scan of the abdomen and pelvis and a KUB was obtained.  I reviewed all of these images.  CT revealed mild-to-moderate left-sided hydro, 7 mm x 10 mm left proximal ureteral stone.  There were also two smaller 3-4 mm stones in the left lower pole.  On KUB, the large stone was visible.  The smaller stones were more difficult to see. The right kidney had some scattered nonobstructing small renal stones with the largest being 4-mm in size.  LAB DATA:  The urinalysis showed negative leukocyte esterase, negative nitrite with no pyuria.  There were too numerous to count red blood cells.  There were a few bacteria.  White count was 6.8, BUN 21, creatinine 0.9.  IMPRESSION: 1. Left proximal ureteral stone. 2. Left hydronephrosis. 3. Bilateral small nephrolithiasis.  PLAN:  I discussed the CT findings with the patient and drew her a diagram of the  anatomy.  I discussed with the patient and her family the nature, risks, and benefits of surveillance with medical expulsion therapy, outpatient or elective extracorporeal shockwave lithotripsy, cystoscopy with ureteral stent placement, possible ureteroscopy with laser lithotripsy more urgently today in the operating room.  We discussed the likelihood of success to render her stone-free and resolve the obstruction with all of these modalities.  All questions were answered.  The patient elected to proceed with left extracorporeal shockwave lithotripsy on an elective and outpatient basis.  I think this is reasonable approach given her pain is now  controlled and there are no signs of infection.  I gave her my contact info and I will have my office set up followup and shockwave lithotripsy in the coming days.  I discussed plan with the new ER physician who took over for Dr. Nicanor Alcon and he will send the patient out on some pain medications, antibiotics.          ______________________________ Jerilee Field, MD     ME/MEDQ  D:  11/04/2010  T:  11/04/2010  Job:  [id number]  Electronically Signed by Jerilee Field MD on 11/19/2010 07:18:24 PM

## 2010-11-28 ENCOUNTER — Other Ambulatory Visit: Payer: Self-pay | Admitting: Internal Medicine

## 2010-11-28 MED ORDER — POTASSIUM CHLORIDE CRYS ER 20 MEQ PO TBCR: 20.0000 meq | EXTENDED_RELEASE_TABLET | Freq: Every day | ORAL | Status: DC

## 2010-11-28 MED ORDER — BENAZEPRIL HCL 40 MG PO TABS: 40.0000 mg | ORAL_TABLET | Freq: Every day | ORAL | Status: DC

## 2010-11-28 NOTE — Telephone Encounter (Signed)
Notified pt rx sent to pharmacy...11/28/10@2 :25pm/LMB

## 2010-11-28 NOTE — Telephone Encounter (Signed)
The pt called and is requesting a refill of Benazepril 40mg  and Potassium Chloride to the [facility] on [address].   Thanks!

## 2010-12-10 ENCOUNTER — Telehealth: Payer: Self-pay | Admitting: *Deleted

## 2010-12-10 MED ORDER — TRIAMTERENE-HCTZ 37.5-25 MG PO CAPS: 1.0000 | ORAL_CAPSULE | Freq: Every day | ORAL | Status: DC

## 2010-12-10 NOTE — Telephone Encounter (Signed)
Left msg on vm need new rx on triamterene. Pharmacy sent med to wrong md. Called pt back will send to [facility]/[address]......12/10/10@4 :19pm/LMB

## 2010-12-31 ENCOUNTER — Encounter: Payer: Self-pay | Admitting: Internal Medicine

## 2010-12-31 ENCOUNTER — Ambulatory Visit (INDEPENDENT_AMBULATORY_CARE_PROVIDER_SITE_OTHER): Payer: BC Managed Care – PPO | Admitting: Internal Medicine

## 2010-12-31 DIAGNOSIS — N200 Calculus of kidney: Secondary | ICD-10-CM

## 2010-12-31 DIAGNOSIS — K219 Gastro-esophageal reflux disease without esophagitis: Secondary | ICD-10-CM

## 2010-12-31 DIAGNOSIS — I10 Essential (primary) hypertension: Secondary | ICD-10-CM

## 2010-12-31 MED ORDER — ZOLPIDEM TARTRATE 10 MG PO TABS: 10.0000 mg | ORAL_TABLET | Freq: Every evening | ORAL | Status: DC | PRN

## 2010-12-31 NOTE — Assessment & Plan Note (Signed)
Multiple intolerances - toprol, ARB - higher dose amlodipine caused SE The current medical regimen is effective;  continue present plan and medications.  BP Readings from Last 3 Encounters:  12/31/10 132/90  10/08/10 132/82  05/12/10 140/96

## 2010-12-31 NOTE — Assessment & Plan Note (Signed)
Stable symptoms on OTC PPI - no red flags on hx or abnormal exam findings The current medical regimen is effective;  continue present plan and medications.

## 2010-12-31 NOTE — Progress Notes (Signed)
  Subjective:    Patient ID: Alexis Wheeler, female    DOB: [birth year], 55 y.o.   MRN: [id number]  HPI   here for follow up - reviewed chronic medical issues today:  HTN - onset mid20s - on meds since that time - prior tx includes dyazide (remains on same), toprol (caused fatigue), amlodipine, benicar (caused HA) and benazapril (recent dose increase) associated with HA when BP elevated, mild chronic edema no associated CP or vision changes - not improved with low salt diet no known kidney problems but reports chronic recurrent hematuria syncope 01/2009 from low BP - meds adjusted -  tol current combo well, no further syncope  GERD - episodes 1-2x/week.  takes OTC ppi meds for same with partial relief - no abd pain, no nausea, vomitting or change in bowels - denies relief from dietary changes, some improved with position change (elevation of head of bed) - reports plans for prior egd to eval same but unable to f/u with gi due to missed appt and dismissal from practice (mann) -   LBP - chronic - no weakness, no numbness, no difficulty walking - improved with heat and using meds form ER (not used at time of fall early spring 2012) -   Recent kidney stone complications and extraction 10/2010 reviewed - feels abdominal pain and flank pain much imporved  Past Medical History  Diagnosis Date  . Morbid obesity   . HEMATURIA UNSPECIFIED   . GERD   . MYOSITIS   . HYPERTENSION   . HYPERLIPIDEMIA     Review of Systems  Respiratory: Negative for shortness of breath.   Cardiovascular: Negative for chest pain.  Musculoskeletal: Negative for joint swelling.  Neurological: Negative for dizziness and headaches.  Psychiatric/Behavioral: Negative for confusion.       Objective:   Physical Exam  BP 132/90  Pulse 97  Temp(Src) 98.6 F (37 C) (Oral)  Wt 202 lb 3.2 oz (91.717 kg)  SpO2 98% Wt Readings from Last 3 Encounters:  12/31/10 202 lb 3.2 oz (91.717 kg)  10/08/10 200 lb 12.8 oz  (91.082 kg)  05/12/10 201 lb (91.173 kg)   Constitutional: She is overweight; She appears well-developed and well-nourished. No distress.  Neck: Normal range of motion. Neck supple. No JVD present. No thyromegaly present.  Cardiovascular: Normal rate, regular rhythm and normal heart sounds.  No murmur heard. No BLE edema. Resp: Effort normal and breath sounds normal. No respiratory distress. She has no wheezes. Abd: obese, SNTND, +BS, no R/G, no mass Psychiatric: She has a normal mood and affect. Her behavior is normal. Judgment and thought content normal.       Lab Results  Component Value Date   WBC 6.8 11/04/2010   HGB 14.6 11/04/2010   HCT 43.0 11/04/2010   PLT 223 11/04/2010   CHOL 169 10/08/2010   TRIG 79.0 10/08/2010   HDL 51.90 10/08/2010   ALT 19 10/08/2010   AST 18 10/08/2010   NA 138 11/04/2010   K 4.1 11/04/2010   CL 100 11/04/2010   CREATININE 0.90 11/04/2010   BUN 21 11/04/2010   CO2 30 10/08/2010   TSH 0.99 10/08/2010    Assessment & Plan:   See problem list. Medications and labs reviewed today.

## 2010-12-31 NOTE — Assessment & Plan Note (Signed)
Reviewed again need for weight loss through diet and exercise changes -  Counseling provided to pt on same - Wt Readings from Last 3 Encounters:  12/31/10 202 lb 3.2 oz (91.717 kg)  10/08/10 200 lb 12.8 oz (91.082 kg)  05/12/10 201 lb (91.173 kg)

## 2010-12-31 NOTE — Assessment & Plan Note (Signed)
Recurrent events - extraction and lithotripsy 10/2010 reviewed - Education on dx and review with pt in depth today - will watch for possible symptoms of recurrence

## 2010-12-31 NOTE — Patient Instructions (Signed)
It was good to see you today. We have reviewed your prior records including labs and tests today We will watch for signs or symptoms or recurrent kidney stones! I am glad you are doing well now! Work on lifestyle changes as discussed (low fat, low carb, increased protein diet; improved exercise efforts; weight loss) to control sugar, blood pressure and cholesterol levels and/or reduce risk of developing other medical problems. Look into [email or web address] or other type of food journal to assist you in this process. Medications reviewed, no changes at this time. Refill on medication(s) as discussed today. Please schedule followup in 6 months for blood pressure and weight check, call sooner if problems.

## 2011-01-23 ENCOUNTER — Other Ambulatory Visit: Payer: Self-pay | Admitting: Internal Medicine

## 2011-01-26 ENCOUNTER — Other Ambulatory Visit: Payer: Self-pay | Admitting: Internal Medicine

## 2011-02-06 ENCOUNTER — Other Ambulatory Visit: Payer: Self-pay

## 2011-02-06 MED ORDER — TRIAMTERENE-HCTZ 37.5-25 MG PO CAPS: 1.0000 | ORAL_CAPSULE | Freq: Every day | ORAL | Status: DC

## 2011-06-22 ENCOUNTER — Other Ambulatory Visit: Payer: Self-pay | Admitting: Internal Medicine

## 2011-07-02 ENCOUNTER — Ambulatory Visit (INDEPENDENT_AMBULATORY_CARE_PROVIDER_SITE_OTHER): Payer: BC Managed Care – PPO | Admitting: Internal Medicine

## 2011-07-02 ENCOUNTER — Encounter: Payer: Self-pay | Admitting: Internal Medicine

## 2011-07-02 VITALS — BP 112/82 | HR 75 | Temp 98.1°F | Ht 64.0 in | Wt 206.4 lb

## 2011-07-02 DIAGNOSIS — K219 Gastro-esophageal reflux disease without esophagitis: Secondary | ICD-10-CM

## 2011-07-02 DIAGNOSIS — Z23 Encounter for immunization: Secondary | ICD-10-CM

## 2011-07-02 DIAGNOSIS — Z0000 Encounter for general adult medical examination without abnormal findings: Secondary | ICD-10-CM

## 2011-07-02 DIAGNOSIS — I10 Essential (primary) hypertension: Secondary | ICD-10-CM

## 2011-07-02 MED ORDER — OMEPRAZOLE 20 MG PO CPDR: 20.0000 mg | DELAYED_RELEASE_CAPSULE | Freq: Two times a day (BID) | ORAL | Status: DC

## 2011-07-02 NOTE — Assessment & Plan Note (Signed)
increase OTC PPI to BID x 1 mo - reconsider GI eval if unimproved  no red flags on hx or abnormal exam findings The current medical regimen is effective;  continue present plan and medications.

## 2011-07-02 NOTE — Patient Instructions (Addendum)
It was good to see you today. Health Maintenance reviewed - tetanus updated today - all other recommended immunizations and age-appropriate screenings are up-to-date. Make appointments for your PAP and mammogram this summer when you are due Test(s) ordered today. Your results will be called to you after review (48-72hours after test completion). If any changes need to be made, you will be notified at that time. Medications reviewed, increase omeprazole to 2x/day x 1 month - no other changes at this time. Work on lifestyle changes as discussed (low fat, low carb, increased protein diet; improved exercise efforts; weight loss) to control sugar, blood pressure and cholesterol levels and/or reduce risk of developing other medical problems.  Please schedule followup in 6 months for blood pressure and weight check, call sooner if problems. Preventive Care for Adults, Female A healthy lifestyle and preventive care can promote health and wellness. Preventive health guidelines for women include the following key practices.  A routine yearly physical is a good way to check with your caregiver about your health and preventive screening. It is a chance to share any concerns and updates on your health, and to receive a thorough exam.   Visit your dentist for a routine exam and preventive care every 6 months. Brush your teeth twice a day and floss once a day. Good oral hygiene prevents tooth decay and gum disease.   The frequency of eye exams is based on your age, health, family medical history, use of contact lenses, and other factors. Follow your caregiver's recommendations for frequency of eye exams.   Eat a healthy diet. Foods like vegetables, fruits, whole grains, low-fat dairy products, and lean protein foods contain the nutrients you need without too many calories. Decrease your intake of foods high in solid fats, added sugars, and salt. Eat the right amount of calories for you. Get information about a  proper diet from your caregiver, if necessary.   Regular physical exercise is one of the most important things you can do for your health. Most adults should get at least 150 minutes of moderate-intensity exercise (any activity that increases your heart rate and causes you to sweat) each week. In addition, most adults need muscle-strengthening exercises on 2 or more days a week.   Maintain a healthy weight. The body mass index (BMI) is a screening tool to identify possible weight problems. It provides an estimate of body fat based on height and weight. Your caregiver can help determine your BMI, and can help you achieve or maintain a healthy weight. For adults 20 years and older:   A BMI below 18.5 is considered underweight.   A BMI of 18.5 to 24.9 is normal.   A BMI of 25 to 29.9 is considered overweight.   A BMI of 30 and above is considered obese.   Maintain normal blood lipids and cholesterol levels by exercising and minimizing your intake of saturated fat. Eat a balanced diet with plenty of fruit and vegetables. Blood tests for lipids and cholesterol should begin at age 40 and be repeated every 5 years. If your lipid or cholesterol levels are high, you are over 50, or you are at high risk for heart disease, you may need your cholesterol levels checked more frequently. Ongoing high lipid and cholesterol levels should be treated with medicines if diet and exercise are not effective.   If you smoke, find out from your caregiver how to quit. If you do not use tobacco, do not start.   If you are  pregnant, do not drink alcohol. If you are breastfeeding, be very cautious about drinking alcohol. If you are not pregnant and choose to drink alcohol, do not exceed 1 drink per day. One drink is considered to be 12 ounces (355 mL) of beer, 5 ounces (148 mL) of wine, or 1.5 ounces (44 mL) of liquor.   Avoid use of street drugs. Do not share needles with anyone. Ask for help if you need support or  instructions about stopping the use of drugs.   High blood pressure causes heart disease and increases the risk of stroke. Your blood pressure should be checked at least every 1 to 2 years. Ongoing high blood pressure should be treated with medicines if weight loss and exercise are not effective.   If you are 69 to 57 years old, ask your caregiver if you should take aspirin to prevent strokes.   Diabetes screening involves taking a blood sample to check your fasting blood sugar level. This should be done once every 3 years, after age 11, if you are within normal weight and without risk factors for diabetes. Testing should be considered at a younger age or be carried out more frequently if you are overweight and have at least 1 risk factor for diabetes.   Breast cancer screening is essential preventive care for women. You should practice "breast self-awareness." This means understanding the normal appearance and feel of your breasts and may include breast self-examination. Any changes detected, no matter how small, should be reported to a caregiver. Women in their 31s and 30s should have a clinical breast exam (CBE) by a caregiver as part of a regular health exam every 1 to 3 years. After age 96, women should have a CBE every year. Starting at age 81, women should consider having a mammography (breast X-ray test) every year. Women who have a family history of breast cancer should talk to their caregiver about genetic screening. Women at a high risk of breast cancer should talk to their caregivers about having magnetic resonance imaging (MRI) and a mammography every year.   The Pap test is a screening test for cervical cancer. A Pap test can show cell changes on the cervix that might become cervical cancer if left untreated. A Pap test is a procedure in which cells are obtained and examined from the lower end of the uterus (cervix).   Women should have a Pap test starting at age 10.   Between ages 3 and  86, Pap tests should be repeated every 2 years.   Beginning at age 15, you should have a Pap test every 3 years as long as the past 3 Pap tests have been normal.   Some women have medical problems that increase the chance of getting cervical cancer. Talk to your caregiver about these problems. It is especially important to talk to your caregiver if a new problem develops soon after your last Pap test. In these cases, your caregiver may recommend more frequent screening and Pap tests.   The above recommendations are the same for women who have or have not gotten the vaccine for human papillomavirus (HPV).   If you had a hysterectomy for a problem that was not cancer or a condition that could lead to cancer, then you no longer need Pap tests. Even if you no longer need a Pap test, a regular exam is a good idea to make sure no other problems are starting.   If you are between ages 15 and  70, and you have had normal Pap tests going back 10 years, you no longer need Pap tests. Even if you no longer need a Pap test, a regular exam is a good idea to make sure no other problems are starting.   If you have had past treatment for cervical cancer or a condition that could lead to cancer, you need Pap tests and screening for cancer for at least 20 years after your treatment.   If Pap tests have been discontinued, risk factors (such as a new sexual partner) need to be reassessed to determine if screening should be resumed.   The HPV test is an additional test that may be used for cervical cancer screening. The HPV test looks for the virus that can cause the cell changes on the cervix. The cells collected during the Pap test can be tested for HPV. The HPV test could be used to screen women aged 38 years and older, and should be used in women of any age who have unclear Pap test results. After the age of 74, women should have HPV testing at the same frequency as a Pap test.   Colorectal cancer can be detected and  often prevented. Most routine colorectal cancer screening begins at the age of 82 and continues through age 93. However, your caregiver may recommend screening at an earlier age if you have risk factors for colon cancer. On a yearly basis, your caregiver may provide home test kits to check for hidden blood in the stool. Use of a small camera at the end of a tube, to directly examine the colon (sigmoidoscopy or colonoscopy), can detect the earliest forms of colorectal cancer. Talk to your caregiver about this at age 38, when routine screening begins.  Direct examination of the colon should be repeated every 5 to 10 years through age 31, unless early forms of pre-cancerous polyps or small growths are found.   Hepatitis C blood testing is recommended for all people born from [birth year] through 1965 and any individual with known risks for hepatitis C.   Practice safe sex. Use condoms and avoid high-risk sexual practices to reduce the spread of sexually transmitted infections (STIs). STIs include gonorrhea, chlamydia, syphilis, trichomonas, herpes, HPV, and human immunodeficiency virus (HIV). Herpes, HIV, and HPV are viral illnesses that have no cure. They can result in disability, cancer, and death. Sexually active women aged 79 and younger should be checked for chlamydia. Older women with new or multiple partners should also be tested for chlamydia. Testing for other STIs is recommended if you are sexually active and at increased risk.   Osteoporosis is a disease in which the bones lose minerals and strength with aging. This can result in serious bone fractures. The risk of osteoporosis can be identified using a bone density scan. Women ages 75 and over and women at risk for fractures or osteoporosis should discuss screening with their caregivers. Ask your caregiver whether you should take a calcium supplement or vitamin D to reduce the rate of osteoporosis.   Menopause can be associated with physical symptoms and  risks. Hormone replacement therapy is available to decrease symptoms and risks. You should talk to your caregiver about whether hormone replacement therapy is right for you.   Use sunscreen with sun protection factor (SPF) of 30 or more. Apply sunscreen liberally and repeatedly throughout the day. You should seek shade when your shadow is shorter than you. Protect yourself by wearing long sleeves, pants, a wide-brimmed hat, and sunglasses  year round, whenever you are outdoors.   Once a month, do a whole body skin exam, using a mirror to look at the skin on your back. Notify your caregiver of new moles, moles that have irregular borders, moles that are larger than a pencil eraser, or moles that have changed in shape or color.   Stay current with required immunizations.   Influenza. You need a dose every fall (or winter). The composition of the flu vaccine changes each year, so being vaccinated once is not enough.   Pneumococcal polysaccharide. You need 1 to 2 doses if you smoke cigarettes or if you have certain chronic medical conditions. You need 1 dose at age 94 (or older) if you have never been vaccinated.   Tetanus, diphtheria, pertussis (Tdap, Td). Get 1 dose of Tdap vaccine if you are younger than age 66, are over 60 and have contact with an infant, are a Research scientist (physical sciences), are pregnant, or simply want to be protected from whooping cough. After that, you need a Td booster dose every 10 years. Consult your caregiver if you have not had at least 3 tetanus and diphtheria-containing shots sometime in your life or have a deep or dirty wound.   HPV. You need this vaccine if you are a woman age 60 or younger. The vaccine is given in 3 doses over 6 months.   Measles, mumps, rubella (MMR). You need at least 1 dose of MMR if you were born in 1957 or later. You may also need a second dose.   Meningococcal. If you are age 74 to 79 and a first-year college student living in a residence hall, or have one of  several medical conditions, you need to get vaccinated against meningococcal disease. You may also need additional booster doses.   Zoster (shingles). If you are age 76 or older, you should get this vaccine.   Varicella (chickenpox). If you have never had chickenpox or you were vaccinated but received only 1 dose, talk to your caregiver to find out if you need this vaccine.   Hepatitis A. You need this vaccine if you have a specific risk factor for hepatitis A virus infection or you simply wish to be protected from this disease. The vaccine is usually given as 2 doses, 6 to 18 months apart.   Hepatitis B. You need this vaccine if you have a specific risk factor for hepatitis B virus infection or you simply wish to be protected from this disease. The vaccine is given in 3 doses, usually over 6 months.  Preventive Services / Frequency Ages 63 to 38  Blood pressure check.** / Every 1 to 2 years.   Lipid and cholesterol check.** / Every 5 years beginning at age 19.   Clinical breast exam.** / Every 3 years for women in their 61s and 30s.   Pap test.** / Every 2 years from ages 66 through 24. Every 3 years starting at age 34 through age 35 or 55 with a history of 3 consecutive normal Pap tests.   HPV screening.** / Every 3 years from ages 61 through ages 64 to 39 with a history of 3 consecutive normal Pap tests.   Hepatitis C blood test.** / For any individual with known risks for hepatitis C.   Skin self-exam. / Monthly.   Influenza immunization.** / Every year.   Pneumococcal polysaccharide immunization.** / 1 to 2 doses if you smoke cigarettes or if you have certain chronic medical conditions.   Tetanus, diphtheria,  pertussis (Tdap, Td) immunization. / A one-time dose of Tdap vaccine. After that, you need a Td booster dose every 10 years.   HPV immunization. / 3 doses over 6 months, if you are 46 and younger.   Measles, mumps, rubella (MMR) immunization. / You need at least 1 dose of  MMR if you were born in 1957 or later. You may also need a second dose.   Meningococcal immunization. / 1 dose if you are age 91 to 13 and a first-year college student living in a residence hall, or have one of several medical conditions, you need to get vaccinated against meningococcal disease. You may also need additional booster doses.   Varicella immunization.** / Consult your caregiver.   Hepatitis A immunization.** / Consult your caregiver. 2 doses, 6 to 18 months apart.   Hepatitis B immunization.** / Consult your caregiver. 3 doses usually over 6 months.  Ages 81 to 25  Blood pressure check.** / Every 1 to 2 years.   Lipid and cholesterol check.** / Every 5 years beginning at age 56.   Clinical breast exam.** / Every year after age 72.   Mammogram.** / Every year beginning at age 38 and continuing for as long as you are in good health. Consult with your caregiver.   Pap test.** / Every 3 years starting at age 31 through age 38 or 9 with a history of 3 consecutive normal Pap tests.   HPV screening.** / Every 3 years from ages 7 through ages 29 to 51 with a history of 3 consecutive normal Pap tests.   Fecal occult blood test (FOBT) of stool. / Every year beginning at age 18 and continuing until age 84. You may not need to do this test if you get a colonoscopy every 10 years.   Flexible sigmoidoscopy or colonoscopy.** / Every 5 years for a flexible sigmoidoscopy or every 10 years for a colonoscopy beginning at age 65 and continuing until age 53.   Hepatitis C blood test.** / For all people born from 50 through 1965 and any individual with known risks for hepatitis C.   Skin self-exam. / Monthly.   Influenza immunization.** / Every year.   Pneumococcal polysaccharide immunization.** / 1 to 2 doses if you smoke cigarettes or if you have certain chronic medical conditions.   Tetanus, diphtheria, pertussis (Tdap, Td) immunization.** / A one-time dose of Tdap vaccine. After  that, you need a Td booster dose every 10 years.   Measles, mumps, rubella (MMR) immunization. / You need at least 1 dose of MMR if you were born in 1957 or later. You may also need a second dose.   Varicella immunization.** / Consult your caregiver.   Meningococcal immunization.** / Consult your caregiver.   Hepatitis A immunization.** / Consult your caregiver. 2 doses, 6 to 18 months apart.   Hepatitis B immunization.** / Consult your caregiver. 3 doses, usually over 6 months.  Ages 22 and over  Blood pressure check.** / Every 1 to 2 years.   Lipid and cholesterol check.** / Every 5 years beginning at age 65.   Clinical breast exam.** / Every year after age 66.   Mammogram.** / Every year beginning at age 75 and continuing for as long as you are in good health. Consult with your caregiver.   Pap test.** / Every 3 years starting at age 29 through age 33 or 35 with a 3 consecutive normal Pap tests. Testing can be stopped between 65 and 70 with  3 consecutive normal Pap tests and no abnormal Pap or HPV tests in the past 10 years.   HPV screening.** / Every 3 years from ages 44 through ages 40 or 73 with a history of 3 consecutive normal Pap tests. Testing can be stopped between 65 and 70 with 3 consecutive normal Pap tests and no abnormal Pap or HPV tests in the past 10 years.   Fecal occult blood test (FOBT) of stool. / Every year beginning at age 23 and continuing until age 52. You may not need to do this test if you get a colonoscopy every 10 years.   Flexible sigmoidoscopy or colonoscopy.** / Every 5 years for a flexible sigmoidoscopy or every 10 years for a colonoscopy beginning at age 20 and continuing until age 66.   Hepatitis C blood test.** / For all people born from [birth year] through 1966 and any individual with known risks for hepatitis C.   Osteoporosis screening.** / A one-time screening for women ages 78 and over and women at risk for fractures or osteoporosis.   Skin  self-exam. / Monthly.   Influenza immunization.** / Every year.   Pneumococcal polysaccharide immunization.** / 1 dose at age 5 (or older) if you have never been vaccinated.   Tetanus, diphtheria, pertussis (Tdap, Td) immunization. / A one-time dose of Tdap vaccine if you are over 65 and have contact with an infant, are a Research scientist (physical sciences), or simply want to be protected from whooping cough. After that, you need a Td booster dose every 10 years.   Varicella immunization.** / Consult your caregiver.   Meningococcal immunization.** / Consult your caregiver.   Hepatitis A immunization.** / Consult your caregiver. 2 doses, 6 to 18 months apart.   Hepatitis B immunization.** / Check with your caregiver. 3 doses, usually over 6 months.  ** Family history and personal history of risk and conditions may change your caregiver's recommendations. Document Released: 02/24/2001 Document Revised: 12/18/2010 Document Reviewed: 05/26/2010 [facility] Patient Information 2012 [place], [place].

## 2011-07-02 NOTE — Progress Notes (Signed)
Subjective:    Patient ID: Alexis Wheeler, female    DOB: October 23, 1955, 56 y.o.   MRN: [id number]  HPI  patient is here today for annual physical. Patient feels well overall.  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 Increase indigestion with bloat. takes OTC ppi meds for same with partial relief - no abdominal pain, no nausea, vomitting or change in bowels - denies relief from dietary changes, some improved with position change (elevation of head of bed) - reports plans for prior egd to eval same but unable to follow up with gi due to missed appt and dismissal from practice (mann) -   LBP - mild, chronic - no weakness, no numbness, no difficulty walking - improved with heat  hx kidney stone complications and extraction 10/2010 reviewed - no recurrent symptoms of abdominal pain or flank pain  Past Medical History  Diagnosis Date  . Morbid obesity   . HEMATURIA UNSPECIFIED   . GERD   . MYOSITIS   . HYPERTENSION   . HYPERLIPIDEMIA    Family History  Problem Relation Age of Onset  . Breast cancer Mother 67  . Breast cancer Sister   . Throat cancer Brother     1 bro living   History  Substance Use Topics  . Smoking status: Never Smoker   . 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 Alcohol Use: No    Review of Systems Constitutional: Negative for fever or weight  change.  Respiratory: Negative for cough and shortness of breath.   Cardiovascular: Negative for chest pain or palpitations.  Gastrointestinal: Negative for abdominal pain, no bowel changes.  Musculoskeletal: Negative for gait problem or joint swelling.  Skin: Negative for rash.  Neurological: Negative for dizziness or headache.  No other specific complaints in a complete review of systems (except as listed in HPI above).     Objective:   Physical Exam  BP 112/82  Pulse 75  Temp 98.1 F (36.7 C) (Oral)  Ht 5\' 4"  (1.626 m)  Wt 206 lb 6.4 oz (93.622 kg)  BMI 35.43 kg/m2  SpO2 99% Wt Readings from Last 3 Encounters:  07/02/11 206 lb 6.4 oz (93.622 kg)  12/31/10 202 lb 3.2 oz (91.717 kg)  10/08/10 200 lb 12.8 oz (91.082 kg)   Constitutional: She is overweight, appears well-developed and well-nourished. No distress.  HENT: Head: Normocephalic and atraumatic. Ears: B TMs ok, no erythema or effusion; Nose: Nose normal. Mouth/Throat: Oropharynx is clear and moist. No oropharyngeal exudate.  Eyes: Conjunctivae and EOM are normal. Pupils are equal, round, and reactive to light. No scleral icterus.  Neck: Normal range of motion. Neck supple. No JVD present. No thyromegaly present.  Cardiovascular: Normal rate, regular rhythm and normal heart sounds.  No murmur heard. No BLE edema. Pulmonary/Chest: Effort normal and breath sounds normal. No respiratory distress. She has no wheezes.  Abdominal: Soft.  Bowel sounds are normal. She exhibits no distension. There is no tenderness. no masses GU: defer to gyn Musculoskeletal: Normal range of motion, no joint effusions. No gross deformities Neurological: She is alert and oriented to person, place, and time. No cranial nerve deficit. Coordination normal.  Skin: Skin is warm and dry. No rash noted. No erythema.  Psychiatric: She has a normal mood and affect. Her behavior is normal. Judgment and thought content normal.       Lab Results  Component  Value Date   WBC 6.8 11/04/2010   HGB 14.6 11/04/2010   HCT 43.0 11/04/2010   PLT 223 11/04/2010   CHOL 169 10/08/2010   TRIG 79.0 10/08/2010   HDL 51.90 10/08/2010   ALT 19 10/08/2010   AST 18 10/08/2010   NA 138 11/04/2010   K 4.1 11/04/2010   CL 100 11/04/2010   CREATININE 0.90 11/04/2010   BUN 21 11/04/2010   CO2 30 10/08/2010   TSH 0.99 10/08/2010    Assessment & Plan:  CPX/v70.0 - Patient has been counseled on age-appropriate routine health concerns for screening and prevention. These are reviewed and up-to-date. Immunizations are up-to-date or declined. Labs ordered and will be reviewed.  Also See problem list. Medications and labs reviewed today.

## 2011-07-02 NOTE — Assessment & Plan Note (Signed)
Multiple intolerances - toprol, ARB - higher dose amlodipine caused SE The current medical regimen is effective;  continue present plan and medications.  BP Readings from Last 3 Encounters:  07/02/11 112/82  12/31/10 132/90  10/08/10 132/82

## 2011-07-09 ENCOUNTER — Telehealth: Payer: Self-pay | Admitting: Internal Medicine

## 2011-07-09 ENCOUNTER — Other Ambulatory Visit (INDEPENDENT_AMBULATORY_CARE_PROVIDER_SITE_OTHER): Payer: BC Managed Care – PPO

## 2011-07-09 DIAGNOSIS — Z0000 Encounter for general adult medical examination without abnormal findings: Secondary | ICD-10-CM

## 2011-07-09 LAB — CBC WITH DIFFERENTIAL/PLATELET
Basophils Absolute: 0 10*3/uL (ref 0.0–0.1)
Basophils Relative: 0.5 % (ref 0.0–3.0)
Eosinophils Absolute: 0.4 10*3/uL (ref 0.0–0.7)
Eosinophils Relative: 5.1 % — ABNORMAL HIGH (ref 0.0–5.0)
HCT: 40.1 % (ref 36.0–46.0)
Hemoglobin: 12.8 g/dL (ref 12.0–15.0)
Lymphocytes Relative: 41.1 % (ref 12.0–46.0)
Lymphs Abs: 3.2 10*3/uL (ref 0.7–4.0)
MCHC: 31.8 g/dL (ref 30.0–36.0)
MCV: 72.1 fl — ABNORMAL LOW (ref 78.0–100.0)
Monocytes Absolute: 0.7 10*3/uL (ref 0.1–1.0)
Monocytes Relative: 8.6 % (ref 3.0–12.0)
Neutro Abs: 3.5 10*3/uL (ref 1.4–7.7)
Neutrophils Relative %: 44.7 % (ref 43.0–77.0)
Platelets: 215 10*3/uL (ref 150.0–400.0)
RBC: 5.56 Mil/uL — ABNORMAL HIGH (ref 3.87–5.11)
RDW: 15.1 % — ABNORMAL HIGH (ref 11.5–14.6)
WBC: 7.7 10*3/uL (ref 4.5–10.5)

## 2011-07-09 LAB — BASIC METABOLIC PANEL
BUN: 14 mg/dL (ref 6–23)
CO2: 28 mEq/L (ref 19–32)
Calcium: 9 mg/dL (ref 8.4–10.5)
Chloride: 102 mEq/L (ref 96–112)
Creatinine, Ser: 0.8 mg/dL (ref 0.4–1.2)
GFR: 89.94 mL/min (ref >60.00)
Glucose, Bld: 97 mg/dL (ref 70–99)
Potassium: 3.7 mEq/L (ref 3.5–5.1)
Sodium: 139 mEq/L (ref 135–145)

## 2011-07-09 LAB — LIPID PANEL
Cholesterol: 174 mg/dL (ref 0–200)
HDL: 52 mg/dL (ref >39.00)
LDL Cholesterol: 110 mg/dL — ABNORMAL HIGH (ref 0–99)
Total CHOL/HDL Ratio: 3
Triglycerides: 61 mg/dL (ref 0.0–149.0)
VLDL: 12.2 mg/dL (ref 0.0–40.0)

## 2011-07-09 LAB — URINALYSIS, ROUTINE W REFLEX MICROSCOPIC
Bilirubin Urine: NEGATIVE
Hgb urine dipstick: NEGATIVE
Ketones, ur: NEGATIVE
Leukocytes, UA: NEGATIVE
Nitrite: NEGATIVE
Specific Gravity, Urine: 1.02 (ref 1.000–1.030)
Total Protein, Urine: NEGATIVE
Urine Glucose: NEGATIVE
Urobilinogen, UA: 0.2 (ref 0.0–1.0)
pH: 6 (ref 5.0–8.0)

## 2011-07-09 LAB — HEPATIC FUNCTION PANEL
ALT: 18 U/L (ref 0–35)
AST: 16 U/L (ref 0–37)
Albumin: 3.9 g/dL (ref 3.5–5.2)
Alkaline Phosphatase: 63 U/L (ref 39–117)
Bilirubin, Direct: 0 mg/dL (ref 0.0–0.3)
Total Bilirubin: 0.5 mg/dL (ref 0.3–1.2)
Total Protein: 6.9 g/dL (ref 6.0–8.3)

## 2011-07-09 LAB — TSH: TSH: 0.88 u[IU]/mL (ref 0.35–5.50)

## 2011-07-09 NOTE — Telephone Encounter (Signed)
Pt came in and req a call from the nurse due to the knot and itch on her arm from tetanus shot that she had on 07/02/11. Pt is very concern, please call her.

## 2011-07-09 NOTE — Telephone Encounter (Signed)
This can be a normal reaction -recommended tylenol every 4h as needed for discomfort -also warm or col pack 3x/day to injection site  if fever, increasing redness or increasing pain despite warm compress (or ice if preferred), call for OV

## 2011-07-09 NOTE — Telephone Encounter (Signed)
Pt advised of MD instruction and expresses understanding.

## 2011-07-28 ENCOUNTER — Encounter: Payer: Self-pay | Admitting: Internal Medicine

## 2011-07-28 ENCOUNTER — Ambulatory Visit (INDEPENDENT_AMBULATORY_CARE_PROVIDER_SITE_OTHER): Payer: BC Managed Care – PPO | Admitting: Internal Medicine

## 2011-07-28 VITALS — BP 142/80 | HR 74 | Temp 98.4°F | Ht 64.0 in | Wt 207.8 lb

## 2011-07-28 DIAGNOSIS — M7022 Olecranon bursitis, left elbow: Secondary | ICD-10-CM

## 2011-07-28 DIAGNOSIS — M7020 Olecranon bursitis, unspecified elbow: Secondary | ICD-10-CM

## 2011-07-28 NOTE — Patient Instructions (Addendum)
It was good to see you today. Place ice to your elbow 3x/day for 10-15 minutes and wrap with ACE bandage as needed Call if any fever, increase pin/swelling or if any other bruising developsBursitis Bursitis is when the fluid-filled sac (bursa) that covers and protects a joint gets puffy and irritated. The elbow, shoulder, hip, and knee joints are most often affected. HOME CARE  Put ice on the area.   Put ice in a plastic bag.   Place a towel between your skin and the bag.   Leave the ice on for 15 to 20 minutes, 3 to 4 times a day.   Put the joint through a full range of motion 4 times a day. Rest the injured joint at other times. When you have less pain, begin slow movements and usual activities.   Only take medicine as told by your doctor.   Follow up with your doctor. Any delay in care could stop the bursitis from healing. This could cause long-term pain.  GET HELP RIGHT AWAY IF:    You have more pain with treatment.   You have a temperature by mouth above 102 F (38.9 C), not controlled by medicine.   You have heat and irritation over the fluid-filled sac.  MAKE SURE YOU:    Understand these instructions.   Will watch your condition.   Will get help right away if you are not doing well or get worse.  Document Released: 06/18/2009 Document Revised: 12/18/2010 Document Reviewed: 06/18/2009 [facility] Patient Information 2012 [place], [place].

## 2011-07-28 NOTE — Progress Notes (Signed)
  Subjective:    Patient ID: Alexis Wheeler, female    DOB: 1955-05-30, 56 y.o.   MRN: [id number]  HPI  complains of ?insect bite Onset swelling over olecranon L elbow 5 days ago denies precipitating trauma but slightly tender to touch Then bruise on inside L elbow 2 days ago.   Past Medical History  Diagnosis Date  . Morbid obesity   . HEMATURIA UNSPECIFIED   . GERD   . MYOSITIS   . HYPERTENSION   . HYPERLIPIDEMIA     Review of Systems  Constitutional: Negative for fever and fatigue.  Musculoskeletal: Negative for myalgias.  Skin: Negative for pallor, rash and wound.  Neurological: Negative for headaches.       Objective:   Physical Exam BP 142/80  Pulse 74  Temp 98.4 F (36.9 C) (Oral)  Ht 5\' 4"  (1.626 m)  Wt 207 lb 12.8 oz (94.257 kg)  BMI 35.67 kg/m2  SpO2 99% Wt Readings from Last 3 Encounters:  07/28/11 207 lb 12.8 oz (94.257 kg)  07/02/11 206 lb 6.4 oz (93.622 kg)  12/31/10 202 lb 3.2 oz (91.717 kg)   Constitutional: She appears well-developed and well-nourished. No distress.  Neck: Normal range of motion. Neck supple. No JVD present. No thyromegaly present.  Cardiovascular: Normal rate, regular rhythm and normal heart sounds.  No murmur heard. No BLE edema. Pulmonary/Chest: Effort normal and breath sounds normal. No respiratory distress. She has no wheezes.  Musculoskeletal: Mild L olecronon bursa swelling - not painful to palpation, no abnormal warmth- full and normal range of motion. No gross deformities Neurological: She is alert and oriented to person, place, and time. No cranial nerve deficit. Coordination normal.  Skin: Bruise on inner L elbow fold - remianing skin is warm and dry. No rash noted. No erythema.  Psychiatric: She has a normal mood and affect. Her behavior is normal. Judgment and thought content normal.   Lab Results  Component Value Date   WBC 7.7 07/09/2011   HGB 12.8 07/09/2011   HCT 40.1 07/09/2011   PLT 215.0 07/09/2011   GLUCOSE 97 07/09/2011   CHOL 174 07/09/2011   TRIG 61.0 07/09/2011   HDL 52.00 07/09/2011   LDLCALC 110* 07/09/2011   ALT 18 07/09/2011   AST 16 07/09/2011   NA 139 07/09/2011   K 3.7 07/09/2011   CL 102 07/09/2011   CREATININE 0.8 07/09/2011   BUN 14 07/09/2011   CO2 28 07/09/2011   TSH 0.88 07/09/2011       Assessment & Plan:  Traumatic olecranon bursitis, left - suspect mild if unrecalled trauma Ice, ACE wrap and call if worse pain, fever, swelling or other bruising

## 2011-09-28 ENCOUNTER — Other Ambulatory Visit: Payer: Self-pay | Admitting: Internal Medicine

## 2011-09-28 DIAGNOSIS — Z1231 Encounter for screening mammogram for malignant neoplasm of breast: Secondary | ICD-10-CM

## 2011-10-06 ENCOUNTER — Telehealth: Payer: Self-pay | Admitting: Internal Medicine

## 2011-10-06 ENCOUNTER — Encounter (HOSPITAL_COMMUNITY): Payer: Self-pay | Admitting: *Deleted

## 2011-10-06 ENCOUNTER — Emergency Department (HOSPITAL_COMMUNITY)
Admission: EM | Admit: 2011-10-06 | Discharge: 2011-10-06 | Disposition: A | Discharge status: Home / Self Care | Payer: BC Managed Care – PPO | Attending: Emergency Medicine | Admitting: Emergency Medicine

## 2011-10-06 DIAGNOSIS — Z7982 Long term (current) use of aspirin: Secondary | ICD-10-CM | POA: Insufficient documentation

## 2011-10-06 DIAGNOSIS — I951 Orthostatic hypotension: Secondary | ICD-10-CM | POA: Insufficient documentation

## 2011-10-06 DIAGNOSIS — Z886 Allergy status to analgesic agent status: Secondary | ICD-10-CM | POA: Insufficient documentation

## 2011-10-06 DIAGNOSIS — I10 Essential (primary) hypertension: Secondary | ICD-10-CM | POA: Insufficient documentation

## 2011-10-06 DIAGNOSIS — K219 Gastro-esophageal reflux disease without esophagitis: Secondary | ICD-10-CM | POA: Insufficient documentation

## 2011-10-06 DIAGNOSIS — Z79899 Other long term (current) drug therapy: Secondary | ICD-10-CM | POA: Insufficient documentation

## 2011-10-06 DIAGNOSIS — E785 Hyperlipidemia, unspecified: Secondary | ICD-10-CM | POA: Insufficient documentation

## 2011-10-06 LAB — URINALYSIS, ROUTINE W REFLEX MICROSCOPIC
Bilirubin Urine: NEGATIVE
Glucose, UA: NEGATIVE mg/dL
Hgb urine dipstick: NEGATIVE
Ketones, ur: NEGATIVE mg/dL
Leukocytes, UA: NEGATIVE
Nitrite: NEGATIVE
Protein, ur: NEGATIVE mg/dL
Specific Gravity, Urine: 1.011 (ref 1.005–1.030)
Urobilinogen, UA: 0.2 mg/dL (ref 0.0–1.0)
pH: 6.5 (ref 5.0–8.0)

## 2011-10-06 LAB — CBC WITH DIFFERENTIAL/PLATELET
Basophils Absolute: 0 10*3/uL (ref 0.0–0.1)
Basophils Relative: 0 % (ref 0–1)
Eosinophils Absolute: 0.2 10*3/uL (ref 0.0–0.7)
Eosinophils Relative: 2 % (ref 0–5)
HCT: 42.5 % (ref 36.0–46.0)
Hemoglobin: 13.9 g/dL (ref 12.0–15.0)
Lymphocytes Relative: 26 % (ref 12–46)
Lymphs Abs: 2 10*3/uL (ref 0.7–4.0)
MCH: 22.7 pg — ABNORMAL LOW (ref 26.0–34.0)
MCHC: 32.7 g/dL (ref 30.0–36.0)
MCV: 69.4 fL — ABNORMAL LOW (ref 78.0–100.0)
Monocytes Absolute: 0.5 10*3/uL (ref 0.1–1.0)
Monocytes Relative: 7 % (ref 3–12)
Neutro Abs: 4.8 10*3/uL (ref 1.7–7.7)
Neutrophils Relative %: 65 % (ref 43–77)
Platelets: 261 10*3/uL (ref 150–400)
RBC: 6.12 MIL/uL — ABNORMAL HIGH (ref 3.87–5.11)
RDW: 14.9 % (ref 11.5–15.5)
WBC: 7.5 10*3/uL (ref 4.0–10.5)

## 2011-10-06 LAB — BASIC METABOLIC PANEL
BUN: 11 mg/dL (ref 6–23)
CO2: 27 mEq/L (ref 19–32)
Calcium: 9.2 mg/dL (ref 8.4–10.5)
Chloride: 99 mEq/L (ref 96–112)
Creatinine, Ser: 0.88 mg/dL (ref 0.50–1.10)
GFR calc Af Amer: 83 mL/min — ABNORMAL LOW (ref >90)
GFR calc non Af Amer: 72 mL/min — ABNORMAL LOW (ref >90)
Glucose, Bld: 108 mg/dL — ABNORMAL HIGH (ref 70–99)
Potassium: 3.3 mEq/L — ABNORMAL LOW (ref 3.5–5.1)
Sodium: 135 mEq/L (ref 135–145)

## 2011-10-06 MED ORDER — POTASSIUM CHLORIDE CRYS ER 20 MEQ PO TBCR
40.0000 meq | EXTENDED_RELEASE_TABLET | Freq: Once | ORAL | Status: AC
  Administered 2011-10-06: 40 meq via ORAL
  Filled 2011-10-06: qty 2

## 2011-10-06 MED ORDER — SODIUM CHLORIDE 0.9 % IV BOLUS (SEPSIS)
1000.0000 mL | Freq: Once | INTRAVENOUS | Status: AC
  Administered 2011-10-06: 1000 mL via INTRAVENOUS

## 2011-10-06 NOTE — ED Notes (Signed)
Pt states her BP at work [employer]) was 90/50. Pt stated she was feeling dizzy so they checked her BP, staff did not recheck her BP prior to leaving work. Pt's BP in triage was 127/79. Pt reports a slight headache and nausea. Pt is A&Ox4, skin is warm and dry, respirations are equal and unlabored.

## 2011-10-06 NOTE — Telephone Encounter (Signed)
i would need to see pt in ov to specifically advise on blood pressure meds and potassium dosing-

## 2011-10-06 NOTE — ED Provider Notes (Signed)
Medical screening examination/treatment/procedure(s) were performed by non-physician practitioner and as supervising physician I was immediately available for consultation/collaboration.   Celene Kras, MD 10/06/11 [phone]

## 2011-10-06 NOTE — Telephone Encounter (Signed)
Caller: [name]/Patient; Patient Name: Alexis Wheeler; PCP: Rene Paci (Adults only); Best Callback Phone Number: [phone]. Call regarding ED follow up. Patient's blood pressure dropped and was dizzy. She went to the ED and was told to talk to her PCP regarding her blood pressure medication. They reported that her Potassium was low also. Blood pressure was 90/52 when the patient was feeling dizzy. Reports mild headache at this time. Patient reports that she does not have money for a copay at this time. Patient has taken Ibuprofen 2 tablets for the headache with some relief noted. All emergent symptoms ruled out per Headache guideline. PLEASE CALL PATIENT AND LET HER KNOW IF SHE NEEDS TO BE SEEN OR IF SHE NEEDS TO CHANGE ANY OF THE MEDICATION SHE TAKES DUE TO THE HYPOTENSION. Thanks. Care advice and call back parameters given per guideline. Patient verbalized understanding.

## 2011-10-06 NOTE — ED Provider Notes (Addendum)
History     CSN: [id number]  Arrival date & time 10/06/11  0446   First MD Initiated Contact with Patient 10/06/11 0708      No chief complaint on file.   (Consider location/radiation/quality/duration/timing/severity/associated sxs/prior treatment) HPI  56 y.o. female in no acute distress who works [employer] overnight shift c/o  lightheaded sensation when going from sitting to standing. Blood pressure was checked and found to be 90/50. Patient states that she has been drinking approximately 2-3 bottles of water a day. Has not started any new medications. Denies any vertigo, generalized or focal weakness, chest pain, shortness of breath, palpitations, or abdominal pain.   Past Medical History  Diagnosis Date  . Morbid obesity   . HEMATURIA UNSPECIFIED   . GERD   . MYOSITIS   . HYPERTENSION   . HYPERLIPIDEMIA     Past Surgical History  Procedure Date  . Cholecystectomy 2001  . Abdominal hysterectomy 1983    Family History  Problem Relation Age of Onset  . Breast cancer Mother 19  . Breast cancer Sister   . Throat cancer Brother     1 bro living    History  Substance Use Topics  . Smoking status: Never Smoker   . 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 Alcohol Use: No    OB History    Grav Para Term Preterm Abortions TAB SAB Ect Mult Living                  Review of Systems  Constitutional: Negative for fever.  Respiratory: Negative for shortness of breath.   Cardiovascular: Negative for chest pain.  Gastrointestinal: Negative for nausea, vomiting, abdominal pain and diarrhea.  Neurological: Positive for light-headedness. Negative for dizziness, syncope and weakness.  All other systems reviewed and are negative.    Allergies  Ibuprofen  Home Medications   Current Outpatient Rx  Name Route Sig Dispense Refill  . AMLODIPINE BESYLATE 5 MG PO TABS   TAKE ONE TABLET BY MOUTH EVERY DAY 30 tablet 5  . VITAMIN C 500 MG PO TABS Oral Take 500 mg by mouth daily.      . ASPIRIN 81 MG PO TABS Oral Take 81 mg by mouth daily.      [name] BENAZEPRIL HCL 40 MG PO TABS Oral Take 1 tablet (40 mg total) by mouth daily. 30 tablet 5  . VITAMIN D3 1000 UNITS PO CAPS Oral Take 1,000 Units by mouth daily.      [name] OMEPRAZOLE 20 MG PO CPDR Oral Take 1 capsule (20 mg total) by mouth 2 (two) times daily. 60 capsule 3  . POTASSIUM CHLORIDE CRYS ER 20 MEQ PO TBCR Oral Take 1 tablet (20 mEq total) by mouth daily. 30 tablet 5  . TRIAMTERENE-HCTZ 37.5-25 MG PO CAPS Oral Take 1 each (1 capsule total) by mouth daily. 30 capsule 5  . VITAMIN B-12 250 MCG PO TABS Oral Take 250 mcg by mouth daily.      [name] ZOLPIDEM TARTRATE 10 MG PO TABS Oral Take 1 tablet (10 mg total) by mouth at bedtime as needed. 30 tablet 1    BP 125/78  Pulse 83  Temp 98 F (36.7 C) (Oral)  Resp 18  SpO2 99%  Physical Exam  Nursing note and vitals reviewed. Constitutional: She is oriented to person, place, and time. She appears well-developed  and well-nourished. No distress.  HENT:  Head: Normocephalic.  Eyes: Conjunctivae normal and EOM are normal. Pupils are equal, round, and reactive to light.  Neck: Normal range of motion. No JVD present.  Cardiovascular: Normal rate, regular rhythm, normal heart sounds and intact distal pulses.   Pulmonary/Chest: Effort normal and breath sounds normal. No stridor. No respiratory distress. She has no wheezes. She has no rales. She exhibits no tenderness.  Abdominal: Soft. Bowel sounds are normal.  Musculoskeletal: Normal range of motion. She exhibits no edema and no tenderness.  Neurological: She is alert and oriented to person, place, and time.  Skin: Skin is warm and dry.  Psychiatric: She has a normal mood and affect.    ED Course  Procedures (including critical care time)  Labs Reviewed  CBC WITH DIFFERENTIAL - Abnormal; Notable for the following:     RBC 6.12 (*)     MCV 69.4 (*)     MCH 22.7 (*)     All other components within normal limits  BASIC METABOLIC PANEL - Abnormal; Notable for the following:    Potassium 3.3 (*)     Glucose, Bld 108 (*)     GFR calc non Af Amer 72 (*)     GFR calc Af Amer 83 (*)     All other components within normal limits  URINALYSIS, ROUTINE W REFLEX MICROSCOPIC   No results found.   1. Orthostatic hypotension       MDM   Patient found to have orthostatic vital signs will bolus her 1 L of normal saline.  Orthostatic vital signs rechecked and they are normalized at this time. Stable for discharge.   Pt verbalized understanding and agrees with care plan. Outpatient follow-up and return precautions given.           Wynetta Emery, PA-C 10/06/11 0907  Wynetta Emery, PA-C 10/06/11 [phone]

## 2011-10-07 NOTE — Telephone Encounter (Signed)
Pt advised via personal VM 

## 2011-10-28 ENCOUNTER — Ambulatory Visit
Admission: RE | Admit: 2011-10-28 | Discharge: 2011-10-28 | Disposition: A | Discharge status: Home / Self Care | Payer: BC Managed Care – PPO | Source: Ambulatory Visit | Attending: Internal Medicine | Admitting: Internal Medicine

## 2011-10-28 ENCOUNTER — Other Ambulatory Visit: Payer: Self-pay | Admitting: Internal Medicine

## 2011-10-28 DIAGNOSIS — N6489 Other specified disorders of breast: Secondary | ICD-10-CM

## 2011-10-28 DIAGNOSIS — Z803 Family history of malignant neoplasm of breast: Secondary | ICD-10-CM

## 2011-10-28 DIAGNOSIS — Z1231 Encounter for screening mammogram for malignant neoplasm of breast: Secondary | ICD-10-CM

## 2011-11-03 ENCOUNTER — Other Ambulatory Visit: Payer: Self-pay | Admitting: Internal Medicine

## 2011-11-03 ENCOUNTER — Ambulatory Visit
Admission: RE | Admit: 2011-11-03 | Discharge: 2011-11-03 | Disposition: A | Discharge status: Home / Self Care | Payer: BC Managed Care – PPO | Source: Ambulatory Visit | Attending: Internal Medicine | Admitting: Internal Medicine

## 2011-11-03 DIAGNOSIS — Z803 Family history of malignant neoplasm of breast: Secondary | ICD-10-CM

## 2011-11-03 DIAGNOSIS — N6489 Other specified disorders of breast: Secondary | ICD-10-CM

## 2011-11-04 ENCOUNTER — Other Ambulatory Visit: Payer: Self-pay | Admitting: Internal Medicine

## 2011-12-31 ENCOUNTER — Ambulatory Visit: Payer: BC Managed Care – PPO | Admitting: Internal Medicine

## 2011-12-31 DIAGNOSIS — Z0289 Encounter for other administrative examinations: Secondary | ICD-10-CM

## 2012-01-11 ENCOUNTER — Other Ambulatory Visit: Payer: Self-pay | Admitting: Internal Medicine

## 2012-02-04 ENCOUNTER — Ambulatory Visit: Payer: BC Managed Care – PPO | Admitting: Internal Medicine

## 2012-02-09 ENCOUNTER — Encounter (HOSPITAL_COMMUNITY): Payer: Self-pay | Admitting: Emergency Medicine

## 2012-02-09 ENCOUNTER — Emergency Department (HOSPITAL_COMMUNITY)
Admission: EM | Admit: 2012-02-09 | Discharge: 2012-02-09 | Disposition: A | Discharge status: Home / Self Care | Payer: BC Managed Care – PPO | Attending: Emergency Medicine | Admitting: Emergency Medicine

## 2012-02-09 DIAGNOSIS — I10 Essential (primary) hypertension: Secondary | ICD-10-CM | POA: Insufficient documentation

## 2012-02-09 DIAGNOSIS — H81399 Other peripheral vertigo, unspecified ear: Secondary | ICD-10-CM

## 2012-02-09 DIAGNOSIS — I951 Orthostatic hypotension: Secondary | ICD-10-CM | POA: Insufficient documentation

## 2012-02-09 DIAGNOSIS — Z87448 Personal history of other diseases of urinary system: Secondary | ICD-10-CM | POA: Insufficient documentation

## 2012-02-09 DIAGNOSIS — E785 Hyperlipidemia, unspecified: Secondary | ICD-10-CM | POA: Insufficient documentation

## 2012-02-09 DIAGNOSIS — R42 Dizziness and giddiness: Secondary | ICD-10-CM | POA: Insufficient documentation

## 2012-02-09 DIAGNOSIS — Z8739 Personal history of other diseases of the musculoskeletal system and connective tissue: Secondary | ICD-10-CM | POA: Insufficient documentation

## 2012-02-09 DIAGNOSIS — Z79899 Other long term (current) drug therapy: Secondary | ICD-10-CM | POA: Insufficient documentation

## 2012-02-09 DIAGNOSIS — K219 Gastro-esophageal reflux disease without esophagitis: Secondary | ICD-10-CM | POA: Insufficient documentation

## 2012-02-09 MED ORDER — SODIUM CHLORIDE 0.9 % IV BOLUS (SEPSIS)
1000.0000 mL | Freq: Once | INTRAVENOUS | Status: AC
  Administered 2012-02-09: 1000 mL via INTRAVENOUS

## 2012-02-09 MED ORDER — MECLIZINE HCL 25 MG PO TABS: 20.0000 mg | ORAL_TABLET | Freq: Three times a day (TID) | ORAL | Status: DC | PRN

## 2012-02-09 MED ORDER — METOCLOPRAMIDE HCL 5 MG/ML IJ SOLN
10.0000 mg | Freq: Once | INTRAMUSCULAR | Status: AC
  Administered 2012-02-09: 10 mg via INTRAVENOUS
  Filled 2012-02-09: qty 2

## 2012-02-09 MED ORDER — DIPHENHYDRAMINE HCL 50 MG/ML IJ SOLN
25.0000 mg | Freq: Once | INTRAMUSCULAR | Status: AC
  Administered 2012-02-09: 25 mg via INTRAVENOUS
  Filled 2012-02-09: qty 1

## 2012-02-09 NOTE — ED Provider Notes (Signed)
History     CSN: [id number]  Arrival date & time 02/09/12  0900   First MD Initiated Contact with Patient 02/09/12 [phone]      Chief Complaint  Patient presents with  . Headache  . Dizziness    (Consider location/radiation/quality/duration/timing/severity/associated sxs/prior treatment) HPI Comments: Ms. Alexis Wheeler presents ambulatory for evaluation.  She reports having a headache.  Sh was concerned because last nigh while at work she had sudden onset dizziness and a sensation as if the room were spinning.  She denies nausea and vomiting.  She denies one-side weakness, visual changes, ringing in the ears, difficulty speaking/swallowing, dropping items, stumbling or falling.  She reports this has happened on at least 3 other occasions.  The symptoms lasted around 45 minutes then suddenly resolved.  She also states she has had issues with her blood pressure dropping.  She has seen her PMD and had a previous ER evaluation for the same constellation of symptoms.  The history is provided by the patient. No language interpreter was used.    Past Medical History  Diagnosis Date  . Morbid obesity   . HEMATURIA UNSPECIFIED   . GERD   . MYOSITIS   . HYPERTENSION   . HYPERLIPIDEMIA     Past Surgical History  Procedure Date  . Cholecystectomy 2001  . Abdominal hysterectomy 1983    Family History  Problem Relation Age of Onset  . Breast cancer Mother 47  . Breast cancer Sister   . Throat cancer Brother     1 bro living    History  Substance Use Topics  . Smoking status: Never Smoker   . 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 Alcohol Use: No    OB History    Grav Para Term Preterm Abortions TAB SAB Ect Mult Living                  Review of Systems  Constitutional: Negative for diaphoresis, activity change and fatigue.  HENT: Positive for tinnitus. Negative for  nosebleeds, congestion, sore throat, rhinorrhea, trouble swallowing, neck pain and neck stiffness.   Eyes: Negative for photophobia and visual disturbance.  Respiratory: Negative for cough and chest tightness.   Cardiovascular: Negative for chest pain and leg swelling.  Gastrointestinal: Negative for diarrhea.  Genitourinary: Negative for dysuria, urgency and difficulty urinating.  Musculoskeletal: Negative for myalgias and arthralgias.  Skin: Negative for wound.  Neurological: Positive for dizziness and light-headedness. Negative for tremors, seizures, syncope, facial asymmetry, speech difficulty, weakness and numbness.  Psychiatric/Behavioral: Negative for hallucinations, dysphoric mood and decreased concentration.  All other systems reviewed and are negative.    Allergies  Ibuprofen  Home Medications   Current Outpatient Rx  Name  Route  Sig  Dispense  Refill  . AMLODIPINE BESYLATE 5 MG PO TABS   Oral   Take 5 mg by mouth daily before breakfast.         . VITAMIN C 500 MG PO TABS   Oral   Take 500 mg by mouth daily.           [name] BENAZEPRIL HCL 40 MG PO TABS   Oral   Take 40 mg by mouth daily before breakfast.         . VITAMIN D3 1000 UNITS PO CAPS   Oral   Take 1,000 Units by mouth daily.           [name]  OMEPRAZOLE 20 MG PO CPDR   Oral   Take 1 capsule (20 mg total) by mouth 2 (two) times daily.   60 capsule   3   . TRIAMTERENE-HCTZ 37.5-25 MG PO TABS   Oral   Take 1 tablet by mouth daily before breakfast.         . VITAMIN B-12 250 MCG PO TABS   Oral   Take 250 mcg by mouth daily.             BP 127/67  Pulse 80  Temp 98.3 F (36.8 C)  Resp 18  SpO2 100%  Physical Exam  Nursing note and vitals reviewed. Constitutional: She is oriented to person, place, and time. She appears well-developed and well-nourished. No distress.  HENT:  Head: Normocephalic and atraumatic.  Right Ear: External ear normal.  Left Ear: External ear normal.  Nose:  Nose normal.  Mouth/Throat: Oropharynx is clear and moist. No oropharyngeal exudate.  Eyes: Conjunctivae normal are normal. Pupils are equal, round, and reactive to light. Right eye exhibits no discharge. Left eye exhibits no discharge. No scleral icterus.  Neck: Normal range of motion. Neck supple. No JVD present. No tracheal deviation present. No thyromegaly present.  Cardiovascular: Normal rate, regular rhythm, normal heart sounds and intact distal pulses.  Exam reveals no gallop and no friction rub.   No murmur heard. Pulmonary/Chest: Effort normal and breath sounds normal. No stridor. No respiratory distress. She has no wheezes. She has no rales. She exhibits no tenderness.  Abdominal: Soft. Bowel sounds are normal. She exhibits no distension and no mass. There is no tenderness. There is no rebound and no guarding.  Musculoskeletal: Normal range of motion. She exhibits no edema and no tenderness.  Lymphadenopathy:    She has no cervical adenopathy.  Neurological: She is alert and oriented to person, place, and time. She has normal strength. She displays no atrophy and no tremor. No cranial nerve deficit. She exhibits normal muscle tone. She displays a negative Romberg sign. Coordination (nl finger to nose) and gait normal. GCS eye subscore is 4. GCS verbal subscore is 5. GCS motor subscore is 6. She displays no Babinski's sign on the right side. She displays no Babinski's sign on the left side.  Skin: Skin is warm and dry. No rash noted. She is not diaphoretic. No erythema. No pallor.  Psychiatric: She has a normal mood and affect. Her behavior is normal.    ED Course  Procedures (including critical care time)  Labs Reviewed - No data to display No results found.   No diagnosis found.    MDM  Pt presents for evaluation of recurrent dizziness and vertigo-like symptoms.  She appears nontoxic, afebrile, note stable VS, NAD.  She has a mild decrease in BP and increase in HR on  orthostatic VS.  She has no focal neurologic deficits.  She c/o of a mild headache.  Will administer an IVF bolus, reglan, abd benadryl.  Will reassess.    1215.  Headache has resolved.  She appears comfortable.  She has no ataxia.  Note stable VS.  Her hx appears consistent with peripheral vertigo although it has not been observed here in the ER.  Plan discharge home.  Encouraged close outpt f/u.  Tobin Chad, MD 02/09/12 [phone]

## 2012-02-09 NOTE — ED Notes (Signed)
Pt states that she has been having some dizziness and headache for the past 24 hours. Has seen her md a few times for this and they said they are unable to find the problem. States that the dizziness happen last night when she was at work while walking. Alert x4, ambulatory with steady gait.

## 2012-02-09 NOTE — ED Notes (Signed)
Pt voiced understanding of instruction given. Denies need of WC

## 2012-02-19 ENCOUNTER — Ambulatory Visit: Payer: BC Managed Care – PPO | Admitting: Internal Medicine

## 2012-03-11 ENCOUNTER — Other Ambulatory Visit (INDEPENDENT_AMBULATORY_CARE_PROVIDER_SITE_OTHER): Payer: BC Managed Care – PPO

## 2012-03-11 ENCOUNTER — Other Ambulatory Visit: Payer: Self-pay | Admitting: Internal Medicine

## 2012-03-11 ENCOUNTER — Ambulatory Visit (INDEPENDENT_AMBULATORY_CARE_PROVIDER_SITE_OTHER): Payer: BC Managed Care – PPO | Admitting: Internal Medicine

## 2012-03-11 ENCOUNTER — Encounter: Payer: Self-pay | Admitting: Internal Medicine

## 2012-03-11 VITALS — BP 168/100 | HR 76 | Temp 98.1°F | Wt 198.1 lb

## 2012-03-11 DIAGNOSIS — R5381 Other malaise: Secondary | ICD-10-CM

## 2012-03-11 DIAGNOSIS — R5383 Other fatigue: Secondary | ICD-10-CM

## 2012-03-11 DIAGNOSIS — I10 Essential (primary) hypertension: Secondary | ICD-10-CM

## 2012-03-11 DIAGNOSIS — K219 Gastro-esophageal reflux disease without esophagitis: Secondary | ICD-10-CM

## 2012-03-11 LAB — HEPATIC FUNCTION PANEL
ALT: 19 U/L (ref 0–35)
AST: 15 U/L (ref 0–37)
Albumin: 4.1 g/dL (ref 3.5–5.2)
Alkaline Phosphatase: 83 U/L (ref 39–117)
Bilirubin, Direct: 0.1 mg/dL (ref 0.0–0.3)
Total Bilirubin: 0.7 mg/dL (ref 0.3–1.2)
Total Protein: 7.5 g/dL (ref 6.0–8.3)

## 2012-03-11 LAB — BASIC METABOLIC PANEL
BUN: 12 mg/dL (ref 6–23)
CO2: 29 mEq/L (ref 19–32)
Calcium: 9.4 mg/dL (ref 8.4–10.5)
Chloride: 100 mEq/L (ref 96–112)
Creatinine, Ser: 0.9 mg/dL (ref 0.4–1.2)
GFR: 87.32 mL/min (ref >60.00)
Glucose, Bld: 97 mg/dL (ref 70–99)
Potassium: 3.3 mEq/L — ABNORMAL LOW (ref 3.5–5.1)
Sodium: 138 mEq/L (ref 135–145)

## 2012-03-11 LAB — CBC WITH DIFFERENTIAL/PLATELET
Basophils Absolute: 0 10*3/uL (ref 0.0–0.1)
Basophils Relative: 0.5 % (ref 0.0–3.0)
Eosinophils Absolute: 0.2 10*3/uL (ref 0.0–0.7)
Eosinophils Relative: 3.2 % (ref 0.0–5.0)
HCT: 42.8 % (ref 36.0–46.0)
Hemoglobin: 13.8 g/dL (ref 12.0–15.0)
Lymphocytes Relative: 37.7 % (ref 12.0–46.0)
Lymphs Abs: 2.7 10*3/uL (ref 0.7–4.0)
MCHC: 32.2 g/dL (ref 30.0–36.0)
MCV: 70.4 fl — ABNORMAL LOW (ref 78.0–100.0)
Monocytes Absolute: 0.6 10*3/uL (ref 0.1–1.0)
Monocytes Relative: 8.5 % (ref 3.0–12.0)
Neutro Abs: 3.6 10*3/uL (ref 1.4–7.7)
Neutrophils Relative %: 50.1 % (ref 43.0–77.0)
Platelets: 256 10*3/uL (ref 150.0–400.0)
RBC: 6.07 Mil/uL — ABNORMAL HIGH (ref 3.87–5.11)
RDW: 15.4 % — ABNORMAL HIGH (ref 11.5–14.6)
WBC: 7.1 10*3/uL (ref 4.5–10.5)

## 2012-03-11 LAB — TSH: TSH: 0.64 u[IU]/mL (ref 0.35–5.50)

## 2012-03-11 NOTE — Assessment & Plan Note (Signed)
Multiple intolerances - toprol, ARB - higher dose amlodipine caused SE concerned about low BP reading with vertigo prompting ER eval  BP Readings from Last 3 Encounters:  03/11/12 168/100  02/09/12 138/75  10/06/11 138/85

## 2012-03-11 NOTE — Progress Notes (Signed)
  Subjective:    Patient ID: Alexis Wheeler, female    DOB: 08/09/1955, 57 y.o.   MRN: [id number]  HPI  Here for follow up - reviewed chronic medical issues today:  HTN - onset mid20s - on meds since that time, but variable compliance with same - prior tx includes dyazide (remains on same), toprol (caused fatigue), amlodipine, benicar (caused HA) and benazapril  associated with headache and mild chronic edema no associated chest pain or vision changes - not improved with low salt diet no known kidney problems but reports chronic recurrent hematuria syncope 01/2009 from low BP - meds adjusted -  Emergency room evaluation January 2014 for vertigo, near syncope. ?low BP per pt  GERD - episodes 2x/week. Increase indigestion with bloat. takes OTC ppi meds for same with partial relief - no abdominal pain, no nausea, vomitting or change in bowels - denies relief from dietary changes, some improved with position change (elevation of head of bed) - reports plans for prior egd to eval same but unable to follow up with gi due to missed appt and dismissal from practice (mann) -   LBP - mild, chronic - no weakness, no numbness, no difficulty walking - improved with heat  hx kidney stone complications and extraction 10/2010 reviewed - no recurrent symptoms of abdominal pain or flank pain  Past Medical History  Diagnosis Date  . Morbid obesity   . HEMATURIA UNSPECIFIED   . GERD   . MYOSITIS   . HYPERTENSION   . HYPERLIPIDEMIA     Review of Systems  Constitutional: Negative for fever - positive for fatigue.  Respiratory: Negative for cough and shortness of breath.   Cardiovascular: Negative for chest pain or palpitations.      Objective:   Physical Exam  BP 168/100  Pulse 76  Temp(Src) 98.1 F (36.7 C) (Oral)  Wt 198 lb 1.9 oz (89.867 kg)  BMI 33.99 kg/m2  SpO2 98% Wt Readings from Last 3 Encounters:  03/11/12 198 lb 1.9 oz (89.867 kg)  07/28/11 207 lb 12.8 oz (94.257 kg)   07/02/11 206 lb 6.4 oz (93.622 kg)   Constitutional: She is overweight, appears well-developed and well-nourished. No distress.   Neck: Normal range of motion. Neck supple. No JVD present. No thyromegaly present.  Cardiovascular: Normal rate, regular rhythm and normal heart sounds.  No murmur heard. No BLE edema. Pulmonary/Chest: Effort normal and breath sounds normal. No respiratory distress. She has no wheezes.  Psychiatric: She has a normal mood and affect. Her behavior is normal. Judgment and thought content normal.       Lab Results  Component Value Date   WBC 7.5 10/06/2011   HGB 13.9 10/06/2011   HCT 42.5 10/06/2011   PLT 261 10/06/2011   CHOL 174 07/09/2011   TRIG 61.0 07/09/2011   HDL 52.00 07/09/2011   ALT 18 07/09/2011   AST 16 07/09/2011   NA 135 10/06/2011   K 3.3* 10/06/2011   CL 99 10/06/2011   CREATININE 0.88 10/06/2011   BUN 11 10/06/2011   CO2 27 10/06/2011   TSH 0.88 07/09/2011    Assessment & Plan:   See problem list. Medications and labs reviewed today.  Fatigue - nonspecific symptoms/exam - check screening labs

## 2012-03-11 NOTE — Assessment & Plan Note (Signed)
Nonspecific symptoms continue OTC PPI  And add probiotic - reconsider GI eval if unimproved  no red flags on hx or abnormal exam findings

## 2012-03-11 NOTE — Assessment & Plan Note (Signed)
Trends reviewed - ?unintentional weight loss Reviewed again need for continued weight loss through diet and exercise changes -  Counseling provided to pt on same - Wt Readings from Last 3 Encounters:  03/11/12 198 lb 1.9 oz (89.867 kg)  07/28/11 207 lb 12.8 oz (94.257 kg)  07/02/11 206 lb 6.4 oz (93.622 kg)

## 2012-03-11 NOTE — Patient Instructions (Signed)
It was good to see you today. We have reviewed your prior records including labs and tests today Test(s) ordered today. Your results will be released to MyChart (or called to you) after review, usually within 72hours after test completion. If any changes need to be made, you will be notified at that same time. medications reviewed. Okay to hold benzpril for now but please take amlodipine and Maxide every day as prescribed for blood pressure Monitor blood pressure and call if systolic pressure greater than 140 or less than 90 Probiotic once daily. I recommend Align or Vear Clock colon Health Please schedule followup in 3-4 months, call sooner if problems.

## 2012-04-12 ENCOUNTER — Emergency Department (HOSPITAL_COMMUNITY)
Admission: EM | Admit: 2012-04-12 | Discharge: 2012-04-12 | Disposition: A | Discharge status: Home / Self Care | Payer: BC Managed Care – PPO | Attending: Emergency Medicine | Admitting: Emergency Medicine

## 2012-04-12 ENCOUNTER — Encounter (HOSPITAL_COMMUNITY): Payer: Self-pay | Admitting: Family Medicine

## 2012-04-12 DIAGNOSIS — Z862 Personal history of diseases of the blood and blood-forming organs and certain disorders involving the immune mechanism: Secondary | ICD-10-CM | POA: Insufficient documentation

## 2012-04-12 DIAGNOSIS — K219 Gastro-esophageal reflux disease without esophagitis: Secondary | ICD-10-CM | POA: Insufficient documentation

## 2012-04-12 DIAGNOSIS — Z87448 Personal history of other diseases of urinary system: Secondary | ICD-10-CM | POA: Insufficient documentation

## 2012-04-12 DIAGNOSIS — R55 Syncope and collapse: Secondary | ICD-10-CM | POA: Insufficient documentation

## 2012-04-12 DIAGNOSIS — Z8739 Personal history of other diseases of the musculoskeletal system and connective tissue: Secondary | ICD-10-CM | POA: Insufficient documentation

## 2012-04-12 DIAGNOSIS — R110 Nausea: Secondary | ICD-10-CM | POA: Insufficient documentation

## 2012-04-12 DIAGNOSIS — Z8639 Personal history of other endocrine, nutritional and metabolic disease: Secondary | ICD-10-CM | POA: Insufficient documentation

## 2012-04-12 DIAGNOSIS — Z79899 Other long term (current) drug therapy: Secondary | ICD-10-CM | POA: Insufficient documentation

## 2012-04-12 DIAGNOSIS — I10 Essential (primary) hypertension: Secondary | ICD-10-CM | POA: Insufficient documentation

## 2012-04-12 DIAGNOSIS — R42 Dizziness and giddiness: Secondary | ICD-10-CM | POA: Insufficient documentation

## 2012-04-12 HISTORY — DX: Dizziness and giddiness: R42

## 2012-04-12 LAB — POCT I-STAT, CHEM 8
BUN: 18 mg/dL (ref 6–23)
Calcium, Ion: 1.11 mmol/L — ABNORMAL LOW (ref 1.12–1.23)
Chloride: 101 mEq/L (ref 96–112)
Creatinine, Ser: 1 mg/dL (ref 0.50–1.10)
Glucose, Bld: 100 mg/dL — ABNORMAL HIGH (ref 70–99)
HCT: 43 % (ref 36.0–46.0)
Hemoglobin: 14.6 g/dL (ref 12.0–15.0)
Potassium: 3.8 mEq/L (ref 3.5–5.1)
Sodium: 139 mEq/L (ref 135–145)
TCO2: 30 mmol/L (ref 0–100)

## 2012-04-12 LAB — CBC WITH DIFFERENTIAL/PLATELET
Basophils Absolute: 0 10*3/uL (ref 0.0–0.1)
Basophils Relative: 0 % (ref 0–1)
Eosinophils Absolute: 0.1 10*3/uL (ref 0.0–0.7)
Eosinophils Relative: 1 % (ref 0–5)
HCT: 40.5 % (ref 36.0–46.0)
Hemoglobin: 13.3 g/dL (ref 12.0–15.0)
Lymphocytes Relative: 22 % (ref 12–46)
Lymphs Abs: 1.8 10*3/uL (ref 0.7–4.0)
MCH: 22.9 pg — ABNORMAL LOW (ref 26.0–34.0)
MCHC: 32.8 g/dL (ref 30.0–36.0)
MCV: 69.8 fL — ABNORMAL LOW (ref 78.0–100.0)
Monocytes Absolute: 0.6 10*3/uL (ref 0.1–1.0)
Monocytes Relative: 7 % (ref 3–12)
Neutro Abs: 5.6 10*3/uL (ref 1.7–7.7)
Neutrophils Relative %: 69 % (ref 43–77)
Platelets: 213 10*3/uL (ref 150–400)
RBC: 5.8 MIL/uL — ABNORMAL HIGH (ref 3.87–5.11)
RDW: 15 % (ref 11.5–15.5)
WBC: 8.2 10*3/uL (ref 4.0–10.5)

## 2012-04-12 LAB — URINALYSIS, ROUTINE W REFLEX MICROSCOPIC
Bilirubin Urine: NEGATIVE
Glucose, UA: NEGATIVE mg/dL
Hgb urine dipstick: NEGATIVE
Ketones, ur: NEGATIVE mg/dL
Leukocytes, UA: NEGATIVE
Nitrite: NEGATIVE
Protein, ur: NEGATIVE mg/dL
Specific Gravity, Urine: 1.026 (ref 1.005–1.030)
Urobilinogen, UA: 0.2 mg/dL (ref 0.0–1.0)
pH: 6 (ref 5.0–8.0)

## 2012-04-12 MED ORDER — CALCIUM CARBONATE ANTACID 500 MG PO CHEW
1.0000 | CHEWABLE_TABLET | Freq: Once | ORAL | Status: AC
  Administered 2012-04-12: 200 mg via ORAL
  Filled 2012-04-12: qty 1

## 2012-04-12 NOTE — ED Notes (Signed)
Pt is aware of the need for urine, however is unable to provide sample at this time.

## 2012-04-12 NOTE — ED Provider Notes (Signed)
History     CSN: [id number]  Arrival date & time 04/12/12  0349   None     Chief Complaint  Patient presents with  . Loss of Consciousness    (Consider location/radiation/quality/duration/timing/severity/associated sxs/prior treatment) HPI Comments: Patient is a 57 year old female with a past medical history of hypertension and vertigo who presents after having a syncopal episode while at work. Patient states she was working last night and started to feel nauseous; she then went to stand up and fell to the floor. Patient states she lost consciousness for "less than a minute" and was alert to person, place, and time immediately after regaining consciousness. Patient denies any associated symptoms with this episode as well as any aggravating or alleviating factors. Patient states she is asymptomatic at this time. She denies experiencing fever, headache, vision changes, lightheadedness, dizziness, tinnitus, neck pain or stiffness, chest pain, shortness of breath, vomiting or diarrhea, abdominal pain, melena or hematochezia, urinary symptoms, weakness, and numbness or tingling in her extremities prior to and following her syncopal episode.  Patient was seen in the emergency department at the end of January for episodes of dizziness and was diagnosed with vertigo. Patient was given a prescription which she has not yet had filled. Patient has also seen her primary care provider since, who adjusted the patient's blood pressure medication. PCP Dr. Rene Paci.  Patient is a 57 y.o. female presenting with syncope. The history is provided by the patient. No language interpreter was used.  Loss of Consciousness  Associated symptoms include nausea. Pertinent negatives include abdominal pain, chest pain, confusion, diaphoresis, dizziness, fever, headaches, light-headedness, palpitations, vomiting and weakness.    Past Medical History  Diagnosis Date  . Morbid obesity   . HEMATURIA UNSPECIFIED   .  GERD   . MYOSITIS   . HYPERTENSION   . HYPERLIPIDEMIA   . Vertigo     Past Surgical History  Procedure Laterality Date  . Cholecystectomy  2001  . Abdominal hysterectomy  1983    Family History  Problem Relation Age of Onset  . Breast cancer Mother 56  . Breast cancer Sister   . Throat cancer Brother     1 bro living    History  Substance Use Topics  . Smoking status: Never Smoker   . 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 Alcohol Use: No    OB History   Grav Para Term Preterm Abortions TAB SAB Ect Mult Living                  Review of Systems  Constitutional: Negative for fever, chills and diaphoresis.  HENT: Negative for hearing loss, trouble swallowing, neck pain, neck stiffness, voice change and tinnitus.   Eyes: Negative for pain and visual disturbance.  Respiratory: Negative for chest tightness and shortness of breath.   Cardiovascular: Positive for syncope. Negative for chest pain and palpitations.  Gastrointestinal: Positive for nausea. Negative for vomiting, abdominal pain, diarrhea and blood in stool.  Genitourinary: Negative for dysuria and hematuria.  Skin: Negative for color change and pallor.  Neurological: Positive for syncope. Negative for dizziness, weakness, light-headedness, numbness and headaches.  Psychiatric/Behavioral: Negative for confusion.  All other systems reviewed and are negative.    Allergies  Ibuprofen  Home Medications   Current Outpatient Rx  Name  Route  Sig  Dispense  Refill  .  amLODipine (NORVASC) 5 MG tablet   Oral   Take 5 mg by mouth daily before breakfast.         . Ascorbic Acid (VITAMIN C) 500 MG tablet   Oral   Take 500 mg by mouth daily.           . Cholecalciferol (VITAMIN D3) 1000 UNITS CAPS   Oral   Take 1,000 Units by mouth daily.           [name] omeprazole (PRILOSEC) 20 MG capsule   Oral    Take 1 capsule (20 mg total) by mouth 2 (two) times daily.   60 capsule   3   . triamterene-hydrochlorothiazide (MAXZIDE-25) 37.5-25 MG per tablet   Oral   Take 1 tablet by mouth daily before breakfast.           BP 130/85  Pulse 92  Temp(Src) 97.9 F (36.6 C) (Oral)  Resp 16  Wt 198 lb (89.812 kg)  BMI 33.97 kg/m2  SpO2 97%  Physical Exam  Nursing note and vitals reviewed. Constitutional: She is oriented to person, place, and time. She appears well-developed and well-nourished. No distress.  Patient is well and nontoxic appearing, resting comfortably in her bed. In no acute distress.  HENT:  Head: Normocephalic.  Right Ear: External ear normal.  Left Ear: External ear normal.  Mouth/Throat: Oropharynx is clear and moist. No oropharyngeal exudate.  Symmetric rise of the uvula with phonation. Small amount of soft tissue swelling appreciated laterally to the patient's left eyebrow without abrasion, erythema, or ecchymosis. Patient states this swelling is secondary to her hitting her head on the floor immediately after losing consciousness.  Eyes: EOM are normal. Pupils are equal, round, and reactive to light. Right eye exhibits no discharge. Left eye exhibits no discharge. No scleral icterus.  No hyphema bilaterally  Neck: Normal range of motion. Neck supple.  Cardiovascular: Normal rate, regular rhythm, normal heart sounds and intact distal pulses.   Distal radial, dorsalis pedis, and posterior tibial pulses 2+ bilaterally  Pulmonary/Chest: Effort normal and breath sounds normal. No respiratory distress. She has no wheezes. She has no rales.  Abdominal: Soft. Bowel sounds are normal. She exhibits no distension and no mass. There is no tenderness. There is no rebound and no guarding.  Musculoskeletal: Normal range of motion. She exhibits no edema.  No midline vertebral tenderness; no bony step offs or deformities appreciated. Patient clears Nexus criteria; no cervical midline  tenderness  Lymphadenopathy:    She has no cervical adenopathy.  Neurological: She is alert and oriented to person, place, and time. She has normal reflexes. No cranial nerve deficit.  Cranial nerves II through XII grossly intact. Patient has equal grip strength and 5 out of 5 muscle strength in her bilateral upper and lower extremities. No sensory or motor deficits appreciated. DTRs normal and symmetric. Patient speaks in full sentences without dysarthria or aphasia; follow simple commands and answers questions appropriately.  Skin: Skin is warm and dry. No rash noted. She is not diaphoretic. No erythema.  Psychiatric: She has a normal mood and affect. Her behavior is normal.    ED Course  Procedures (including critical care time)  Labs Reviewed  CBC WITH DIFFERENTIAL - Abnormal; Notable for the following:    RBC 5.80 (*)    MCV 69.8 (*)    MCH 22.9 (*)    All other components within normal limits  POCT I-STAT, CHEM 8 - Abnormal; Notable for the following:  Glucose, Bld 100 (*)    Calcium, Ion 1.11 (*)    All other components within normal limits  URINALYSIS, ROUTINE W REFLEX MICROSCOPIC   No results found.   Date: 04/12/2012  Rate: 64  Rhythm: normal sinus rhythm  QRS Axis: normal  Intervals: QT prolonged  ST/T Wave abnormalities: nonspecific T wave changes  Conduction Disutrbances:none  Narrative Interpretation: NSR with prolonged QT; no STEMI  Old EKG Reviewed: unchanged from 12/2009   1. Syncope      MDM  Patient presents to the emergency department after having a syncopal episode while at work. Patient states she lost consciousness for "less than a minute". Patient has a history of orthostatic hypotension and vertigo; patient has not had her medication filled for her vertigo since her diagnosis. Patient is asymptomatic at this time and has been since the incident. She is well and nontoxic appearing in no acute distress, moving extremities vigorously.  Workup to  include basic labs, urinalysis, orthostatics, and EKG.  Upon entering the room patient is smiling, laughing and joking with a friend on her cell phone. She states that she has remained asymptomatic and wants to go home. Patient's labs consistent with prior workups and EKG unchanged from December 2011. Patient is neurovascularly intact, hemodynamically stable, and walks with normal gait without imbalance; well and nontoxic appearing, in NAD. Stable for d/c with PCP follow up. Indications for ED return discussed. Patient states comfort and understanding with this d/c plan with no unaddressed concerns. Patient work up and management plan discussed with Dr. Jeraldine Loots who is in agreement.  Filed Vitals:   04/12/12 0541 04/12/12 0542 04/12/12 0543 04/12/12 0725  BP: 131/88 142/93 142/91 130/85  Pulse: 64 69 76 92  Temp:      TempSrc:      Resp:    16  Weight:      SpO2:    97%            Antony Madura, PA-C 04/14/12 2009

## 2012-04-12 NOTE — ED Notes (Addendum)
Patient states that she was at work sitting down and began to feel "queasy". States she got up to walk to the break room and she passed out. States that she was diagnosed with vertigo a month ago. Received a Rx for vertigo and never had Rx filled. Reports nausea at this time. Denies dizziness.

## 2012-04-12 NOTE — ED Notes (Signed)
Voiced understanding of instructions given 

## 2012-04-14 ENCOUNTER — Telehealth: Payer: Self-pay | Admitting: Internal Medicine

## 2012-04-14 NOTE — Telephone Encounter (Signed)
Patient Information:  Caller Name: Maeby  Phone: [phone]  Patient: Orvetta, Danielski  Gender: Female  DOB: 09/21/1955  Age: 57 Years  PCP: Rene Paci (Adults only)  Office Follow Up:  Does the office need to follow up with this patient?: No  Instructions For The Office: N/A   Symptoms  Reason For Call & Symptoms: Patient reports recent episodes of nausea followed by fainting; 5 episodes with first one last year and most recently seen in ED 04/12/12 for same.  Room was spinning.  Relates loss of consciousness for a few seconds; she still hears what is going on at the time of the incident. Emergent symptoms ruled out.  See Within 2 Weeks in Office per Fainting guideline.  Reviewed Health History In EMR: Yes  Reviewed Medications In EMR: Yes  Reviewed Allergies In EMR: Yes  Reviewed Surgeries / Procedures: Yes  Date of Onset of Symptoms: Unknown  Guideline(s) Used:  Fainting  Disposition Per Guideline:   See Within 2 Weeks in Office  Reason For Disposition Reached:   Simple fainting is a chronic symptom (has occurred multiple times)  Advice Given:  Warning Symptoms for Fainting:  If you feel these warning symptoms, immediately lie down to prevent falling down. You only have 5 seconds to act (Reason: almost impossible to faint when lying down).  Lying down (even on the floor) is less embarrassing than fainting, no matter where you are.  Sitting down (with head between knees) is certainly better than staying standing up but is less effective than lying down.  Call Back If:  You pass out again on the same day  You become worse.  Patient Will Follow Care Advice:  YES  Appointment Scheduled:  04/15/2012 10:00:00 Appointment Scheduled Provider:  Rene Paci (Adults only)

## 2012-04-15 ENCOUNTER — Ambulatory Visit: Payer: Self-pay | Admitting: Internal Medicine

## 2012-04-15 DIAGNOSIS — Z0289 Encounter for other administrative examinations: Secondary | ICD-10-CM

## 2012-04-15 NOTE — ED Provider Notes (Signed)
  Medical screening examination/treatment/procedure(s) were performed by non-physician practitioner and as supervising physician I was immediately available for consultation/collaboration.    Haik Mahoney, MD 04/15/12 1617 

## 2012-06-10 ENCOUNTER — Ambulatory Visit: Payer: BC Managed Care – PPO | Admitting: Internal Medicine

## 2012-06-14 ENCOUNTER — Ambulatory Visit: Payer: BC Managed Care – PPO | Admitting: Internal Medicine

## 2012-07-13 ENCOUNTER — Other Ambulatory Visit: Payer: Self-pay | Admitting: Internal Medicine

## 2012-07-13 NOTE — Telephone Encounter (Signed)
Faxed script back to [facility].../lmb 

## 2012-08-15 ENCOUNTER — Other Ambulatory Visit: Payer: Self-pay | Admitting: Internal Medicine

## 2012-08-29 ENCOUNTER — Encounter: Payer: Self-pay | Admitting: Internal Medicine

## 2012-08-29 ENCOUNTER — Ambulatory Visit (INDEPENDENT_AMBULATORY_CARE_PROVIDER_SITE_OTHER): Payer: BC Managed Care – PPO | Admitting: Internal Medicine

## 2012-08-29 VITALS — BP 142/88 | HR 85 | Temp 98.6°F | Wt 197.8 lb

## 2012-08-29 DIAGNOSIS — R05 Cough: Secondary | ICD-10-CM

## 2012-08-29 DIAGNOSIS — R059 Cough, unspecified: Secondary | ICD-10-CM

## 2012-08-29 DIAGNOSIS — J309 Allergic rhinitis, unspecified: Secondary | ICD-10-CM | POA: Insufficient documentation

## 2012-08-29 DIAGNOSIS — I10 Essential (primary) hypertension: Secondary | ICD-10-CM

## 2012-08-29 DIAGNOSIS — J9801 Acute bronchospasm: Secondary | ICD-10-CM

## 2012-08-29 MED ORDER — PREDNISONE (PAK) 10 MG PO TABS: 10.0000 mg | ORAL_TABLET | ORAL | Status: DC

## 2012-08-29 MED ORDER — LORATADINE 10 MG PO TABS: 10.0000 mg | ORAL_TABLET | Freq: Every day | ORAL | Status: DC

## 2012-08-29 MED ORDER — HYDROCOD POLST-CHLORPHEN POLST 10-8 MG/5ML PO LQCR: 5.0000 mL | Freq: Two times a day (BID) | ORAL | Status: DC | PRN

## 2012-08-29 NOTE — Assessment & Plan Note (Signed)
Add OTC antihistamine

## 2012-08-29 NOTE — Patient Instructions (Signed)
It was good to see you today. We have reviewed your prior records including labs and tests today If you develop worsening symptoms or fever, call and we can reconsider antibiotics, but it does not appear necessary to use antibiotics at this time. Prednisone taper over next 6 days, Claritin once daily for next 2 weeks and Tussionex syrup for control of cough before sleep Your prescription(s) have been submitted to your pharmacy. Please take as directed and contact our office if you believe you are having problem(s) with the medication(s).  Acute Bronchitis You have acute bronchitis. This means you have a chest cold. The airways in your lungs are red and sore (inflamed). Acute means it is sudden onset.  CAUSES Bronchitis is most often caused by the same virus that causes a cold. SYMPTOMS   Body aches.  Chest congestion.  Chills.  Cough.  Fever.  Shortness of breath.  Sore throat. TREATMENT  Acute bronchitis is usually treated with rest, fluids, and medicines for relief of fever or cough. Most symptoms should go away after a few days or a week. Increased fluids may help thin your secretions and will prevent dehydration. Your caregiver may give you an inhaler to improve your symptoms. The inhaler reduces shortness of breath and helps control cough. You can take over-the-counter pain relievers or cough medicine to decrease coughing, pain, or fever. A cool-air vaporizer may help thin bronchial secretions and make it easier to clear your chest. Antibiotics are usually not needed but can be prescribed if you smoke, are seriously ill, have chronic lung problems, are elderly, or you are at higher risk for developing complications.Allergies and asthma can make bronchitis worse. Repeated episodes of bronchitis may cause longstanding lung problems. Avoid smoking and secondhand smoke.Exposure to cigarette smoke or irritating chemicals will make bronchitis worse. If you are a cigarette smoker,  consider using nicotine gum or skin patches to help control withdrawal symptoms. Quitting smoking will help your lungs heal faster. Recovery from bronchitis is often slow, but you should start feeling better after 2 to 3 days. Cough from bronchitis frequently lasts for 3 to 4 weeks. To prevent another bout of acute bronchitis:  Quit smoking.  Wash your hands frequently to get rid of viruses or use a hand sanitizer.  Avoid other people with cold or virus symptoms.  Try not to touch your hands to your mouth, nose, or eyes. SEEK IMMEDIATE MEDICAL CARE IF:  You develop increased fever, chills, or chest pain.  You have severe shortness of breath or bloody sputum.  You develop dehydration, fainting, repeated vomiting, or a severe headache.  You have no improvement after 1 week of treatment or you get worse. MAKE SURE YOU:   Understand these instructions.  Will watch your condition.  Will get help right away if you are not doing well or get worse. Document Released: 02/06/2004 Document Revised: 03/23/2011 Document Reviewed: 04/23/2010 [facility] Patient Information 2014 [place], [place].

## 2012-08-29 NOTE — Progress Notes (Signed)
Subjective:    Patient ID: Alexis Wheeler, female    DOB: 11/28/1955, 57 y.o.   MRN: [id number]  HPI   Patient presents today with persistent cough x 3 weeks. The cough became productive of white-yellow sputum last week. She has tried robitussin cough syrup and coricidin with no relief of symptoms. She does note increased fatigue over the past 2-3 weeks. She has mild sinus congestion, mild sneeze, and has noted occ mild wheeze. She denies SOB, fever, chest tightness,sore throat, ear pain, and history of allergic rhinitis.   Past Medical History  Diagnosis Date  . Morbid obesity   . HEMATURIA UNSPECIFIED   . GERD   . MYOSITIS   . HYPERTENSION   . HYPERLIPIDEMIA   . Vertigo    Family History  Problem Relation Age of Onset  . Breast cancer Mother 38  . Breast cancer Sister   . Throat cancer Brother     1 bro living   History  Substance Use Topics  . Smoking status: Never Smoker   . 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 Alcohol Use: No      Review of Systems  Cardiovascular: Negative for chest pain.  Gastrointestinal: Negative for nausea, vomiting and abdominal pain.  Musculoskeletal: Negative for myalgias.  Skin: Negative for rash.  Neurological: Negative for headaches.  otherwise negative or as stated in HPI     Objective:   Physical Exam  Constitutional: She is oriented to person, place, and time. She appears well-developed and well-nourished.  HENT:  Head: Normocephalic and atraumatic.  Right Ear: External ear normal.  Left Ear: External ear normal.  Mouth/Throat: Oropharynx is clear and moist.  TM's pearly gray with normal light reflex bilat  Eyes: Conjunctivae are normal. Pupils are equal, round, and reactive to light.  Neck: Normal range of motion. Neck supple. No thyromegaly present.  Cardiovascular: Normal rate and regular rhythm.   Pulmonary/Chest:  Effort normal and breath sounds normal. She has no wheezes. She has no rales.  Lymphadenopathy:    She has no cervical adenopathy.  Neurological: She is alert and oriented to person, place, and time.  Skin: Skin is warm and dry.  Psychiatric: She has a normal mood and affect.    Lab Results  Component Value Date   WBC 8.2 04/12/2012   HGB 14.6 04/12/2012   HCT 43.0 04/12/2012   PLT 213 04/12/2012   GLUCOSE 100* 04/12/2012   CHOL 174 07/09/2011   TRIG 61.0 07/09/2011   HDL 52.00 07/09/2011   LDLCALC 110* 07/09/2011   ALT 19 03/11/2012   AST 15 03/11/2012   NA 139 04/12/2012   K 3.8 04/12/2012   CL 101 04/12/2012   CREATININE 1.00 04/12/2012   BUN 18 04/12/2012   CO2 29 03/11/2012   TSH 0.64 03/11/2012        Assessment & Plan:  1. Cough -Acute Bronchospasm with bronchitis- No indication for bacterial etiology, likely viral. Prescribed prednisone taper to relieve airway inflammation and tussinex for symptomatic relief of cough. Patient was advised to take Claritin OTC x 2-3 weeks for relief of allergy symptoms. If patient does not improve over the next week, worsens over the next few days, or develops a fever or SOB, she was advised to RTC.  Concha Se, [place] [facility]ighbor  I have personally reviewed this case with PA student. I also  personally examined this patient. I agree with history and findings as documented above. I reviewed, discussed and approve of the assessment and plan as listed above. Rene Paci, MD

## 2012-08-29 NOTE — Assessment & Plan Note (Signed)
Multiple intolerances - toprol, ARB - higher dose amlodipine caused side effects  concerned about possible low BP (history of same with ER eval for vertigo)  BP Readings from Last 3 Encounters:  08/29/12 142/88  04/12/12 130/85  03/11/12 168/100

## 2012-09-28 ENCOUNTER — Other Ambulatory Visit: Payer: Self-pay | Admitting: Internal Medicine

## 2012-09-30 ENCOUNTER — Encounter: Payer: Self-pay | Admitting: Internal Medicine

## 2012-09-30 ENCOUNTER — Ambulatory Visit (INDEPENDENT_AMBULATORY_CARE_PROVIDER_SITE_OTHER): Payer: BC Managed Care – PPO | Admitting: Internal Medicine

## 2012-09-30 VITALS — BP 150/92 | HR 70 | Temp 98.4°F | Wt 196.8 lb

## 2012-09-30 DIAGNOSIS — T148XXA Other injury of unspecified body region, initial encounter: Secondary | ICD-10-CM

## 2012-09-30 DIAGNOSIS — Z23 Encounter for immunization: Secondary | ICD-10-CM

## 2012-09-30 DIAGNOSIS — R109 Unspecified abdominal pain: Secondary | ICD-10-CM

## 2012-09-30 DIAGNOSIS — K5900 Constipation, unspecified: Secondary | ICD-10-CM

## 2012-09-30 LAB — POCT URINALYSIS DIPSTICK
Bilirubin, UA: NEGATIVE
Blood, UA: NEGATIVE
Glucose, UA: NEGATIVE
Ketones, UA: NEGATIVE
Nitrite, UA: NEGATIVE
Protein, UA: NEGATIVE
Spec Grav, UA: 1.01
Urobilinogen, UA: NEGATIVE
pH, UA: 5

## 2012-09-30 NOTE — Patient Instructions (Addendum)

## 2012-09-30 NOTE — Progress Notes (Signed)
Subjective:    Patient ID: Alexis Wheeler, female    DOB: 1955/08/24, 57 y.o.   MRN: [id number]  HPI  Pt presents to the clinic today with c/o a left side abdominal pain. This started about 3 days ago. She has a sensation of fullness more than pain. She has taken Ibuprofen OTC and this has helped. She does have BM's every day, however it's like "I only pass a few hard pebbles." She does take laxatives on occasion but has not taken one recently. She does have a history of constipation. She denies hemorrhoids or blood in her stool. Additionally she does c/o some left side back pain. This is a chronic issue for her from an old injury. 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 She denies radicular symptoms. She denies loss of bowel or bladder.  She has taken Ibuprofen which has helped. She has also used Icy hot patches on it which seems to help as well.   Review of Systems      Past Medical History  Diagnosis Date  . Morbid obesity   . HEMATURIA UNSPECIFIED   . GERD   . MYOSITIS   . HYPERTENSION   . HYPERLIPIDEMIA   . Vertigo     Current Outpatient Prescriptions  Medication Sig Dispense Refill  . amLODipine (NORVASC) 5 MG tablet TAKE ONE TABLET BY MOUTH ONCE DAILY  30 tablet  0  . Ascorbic Acid (VITAMIN C) 500 MG tablet Take 500 mg by mouth daily.        . chlorpheniramine-HYDROcodone (TUSSIONEX PENNKINETIC ER) 10-8 MG/5ML LQCR Take 5 mL by mouth every 12 (twelve) hours as needed.  140 mL  0  . Cholecalciferol (VITAMIN D3) 1000 UNITS CAPS Take 1,000 Units by mouth daily.        [name] loratadine (CLARITIN) 10 MG tablet Take 1 tablet (10 mg total) by mouth daily.  30 tablet  2  . omeprazole (PRILOSEC) 20 MG capsule Take 1 capsule (20 mg total) by mouth 2 (two) times daily.  60 capsule  3  . predniSONE (STERAPRED UNI-PAK) 10 MG tablet Take 1 tablet (10 mg total) by mouth as directed. As directed x 6 days  21 tablet  0  . triamterene-hydrochlorothiazide  (MAXZIDE-25) 37.5-25 MG per tablet Take 1 tablet by mouth daily before breakfast.      . zolpidem (AMBIEN) 10 MG tablet TAKE 1 TABLET AT BEDITME AS NEEDED  30 tablet  0   No current facility-administered medications for this visit.    Allergies  Allergen Reactions  . Ibuprofen     REACTION: Upset GI    Family History  Problem Relation Age of Onset  . Breast cancer Mother 28  . Breast cancer Sister   . Throat cancer Brother     1 bro living    History   Social History  . Marital Status: Married    Spouse Name: N/A    Number of Children: N/A  . Years of Education: N/A   Occupational History  . Not on file.   Social History Main Topics  . Smoking status: Never Smoker   . 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 Alcohol Use: No  . Drug Use: No  . Sexual Activity: No   Other Topics Concern  . Not on file  Social History Narrative  . No narrative on file     Constitutional: Denies headache, fever, malaise, fatigue, or abrupt weight changes.  Gastrointestinal: Pt reports abdominal pain. Denies bloating, constipation, diarrhea or blood in the stool.  GU: Denies urgency, frequency, pain with urination, burning sensation, blood in urine, odor or discharge. Musculoskeletal: Pt reports low back pain. Denies decrease in range of motion, difficulty with gait, or joint pain and swelling.    No other specific complaints in a complete review of systems (except as listed in HPI above).  Objective:   Physical Exam   BP 150/92  Pulse 70  Temp(Src) 98.4 F (36.9 C) (Oral)  Wt 196 lb 12 oz (89.245 kg)  BMI 33.76 kg/m2  SpO2 98% Wt Readings from Last 3 Encounters:  09/30/12 196 lb 12 oz (89.245 kg)  08/29/12 197 lb 12.8 oz (89.721 kg)  04/12/12 198 lb (89.812 kg)    General: Appears her stated age, obese but well developed, well nourished in NAD.  Cardiovascular:  Normal rate and rhythm. S1,S2 noted.  No murmur, rubs or gallops noted. No JVD or BLE edema. No carotid bruits noted. Pulmonary/Chest: Normal effort and positive vesicular breath sounds. No respiratory distress. No wheezes, rales or ronchi noted.  Abdomen: Soft and slightly distended. Hypoactive bowel sounds, no bruits noted. No distention or masses noted. Liver, spleen and kidneys non palpable. Musculoskeletal: Normal range of motion. No signs of joint swelling. No difficulty with gait. Tender on the the left lower muscles.  BMET    Component Value Date/Time   NA 139 04/12/2012 0604   K 3.8 04/12/2012 0604   CL 101 04/12/2012 0604   CO2 29 03/11/2012 1214   GLUCOSE 100* 04/12/2012 0604   BUN 18 04/12/2012 0604   CREATININE 1.00 04/12/2012 0604   CALCIUM 9.4 03/11/2012 1214   GFRNONAA 72* 10/06/2011 0625   GFRAA 83* 10/06/2011 0625    Lipid Panel     Component Value Date/Time   CHOL 174 07/09/2011 0743   TRIG 61.0 07/09/2011 0743   HDL 52.00 07/09/2011 0743   CHOLHDL 3 07/09/2011 0743   VLDL 12.2 07/09/2011 0743   LDLCALC 110* 07/09/2011 0743    CBC    Component Value Date/Time   WBC 8.2 04/12/2012 0550   RBC 5.80* 04/12/2012 0550   HGB 14.6 04/12/2012 0604   HCT 43.0 04/12/2012 0604   PLT 213 04/12/2012 0550   MCV 69.8* 04/12/2012 0550   MCH 22.9* 04/12/2012 0550   MCHC 32.8 04/12/2012 0550   RDW 15.0 04/12/2012 0550   LYMPHSABS 1.8 04/12/2012 0550   MONOABS 0.6 04/12/2012 0550   EOSABS 0.1 04/12/2012 0550   BASOSABS 0.0 04/12/2012 0550    Hgb A1C No results found for this basename: HGBA1C         Assessment & Plan:   Constipation, chronic:  Go home and do your normal laxative routine- colace and mag citrate Urinalysis to r/o infection-negative Let me know if no improvement over the weekend Drink lots of water  Muscle strain of left lower back:  Continue patches and Ibuprofen Stretching exercises as indicated on handout Consider PT if no improvement  RTC As needed or if symptoms persist or  worsen

## 2012-12-09 ENCOUNTER — Other Ambulatory Visit: Payer: Self-pay

## 2012-12-09 MED ORDER — AMLODIPINE BESYLATE 5 MG PO TABS: ORAL_TABLET | ORAL | Status: DC

## 2012-12-15 ENCOUNTER — Telehealth: Payer: Self-pay | Admitting: *Deleted

## 2012-12-15 NOTE — Telephone Encounter (Signed)
Pt called states she has had a rash under her breast for approx 3 weeks also states leg cramps just developed.  Please advise

## 2012-12-16 MED ORDER — NYSTATIN 100000 UNIT/GM EX POWD: 1.0000 g | Freq: Two times a day (BID) | CUTANEOUS | Status: DC

## 2012-12-16 NOTE — Telephone Encounter (Signed)
left message for pt to return call.

## 2012-12-16 NOTE — Telephone Encounter (Signed)
A) nystatin powder to rash twice daily. Office visit if unimproved in next 2 weeks - Erx done B) hydration, electrolyte solution such as Gatorade for leg cramps. Office visit if unimproved

## 2012-12-19 NOTE — Telephone Encounter (Signed)
Spoke with pt advised of MDs message 

## 2013-01-20 ENCOUNTER — Ambulatory Visit (INDEPENDENT_AMBULATORY_CARE_PROVIDER_SITE_OTHER): Payer: BC Managed Care – PPO | Admitting: Internal Medicine

## 2013-01-20 ENCOUNTER — Ambulatory Visit (INDEPENDENT_AMBULATORY_CARE_PROVIDER_SITE_OTHER)
Admission: RE | Admit: 2013-01-20 | Discharge: 2013-01-20 | Disposition: A | Discharge status: Home / Self Care | Payer: BC Managed Care – PPO | Source: Ambulatory Visit | Attending: Internal Medicine | Admitting: Internal Medicine

## 2013-01-20 ENCOUNTER — Encounter: Payer: Self-pay | Admitting: Internal Medicine

## 2013-01-20 VITALS — BP 152/100 | HR 70 | Temp 98.0°F | Wt 199.0 lb

## 2013-01-20 DIAGNOSIS — M25559 Pain in unspecified hip: Secondary | ICD-10-CM

## 2013-01-20 DIAGNOSIS — M25551 Pain in right hip: Secondary | ICD-10-CM

## 2013-01-20 DIAGNOSIS — M5430 Sciatica, unspecified side: Secondary | ICD-10-CM

## 2013-01-20 DIAGNOSIS — M5431 Sciatica, right side: Secondary | ICD-10-CM

## 2013-01-20 MED ORDER — CYCLOBENZAPRINE HCL 5 MG PO TABS: 5.0000 mg | ORAL_TABLET | Freq: Every day | ORAL | Status: AC

## 2013-01-20 MED ORDER — NAPROXEN 500 MG PO TABS: 500.0000 mg | ORAL_TABLET | Freq: Two times a day (BID) | ORAL | Status: DC

## 2013-01-20 MED ORDER — PREDNISONE (PAK) 10 MG PO TABS: ORAL_TABLET | ORAL | Status: DC

## 2013-01-20 NOTE — Progress Notes (Signed)
Pre-visit discussion using our clinic review tool. No additional management support is needed unless otherwise documented below in the visit note.  

## 2013-01-20 NOTE — Patient Instructions (Addendum)
It was good to see you today.  We have reviewed your prior records including labs and tests today  Test(s) ordered today. Your results will be released to [facility] (or called to you) after review, usually within 72hours after test completion. If any changes need to be made, you will be notified at that same time.  Medications reviewed and updated,  Use prednsione taper over the next 6 days, use Flexeril at night for the next week, then as needed for back pain and spasm. When complete with prednisone taper, use Naprosyn twice daily with food as needed for back and hip pain symptoms  Your prescription(s) have been submitted to your pharmacy. Please take as directed and contact our office if you believe you are having problem(s) with the medication(s).  No other changes recommended at this time.  Please schedule followup in 3-4 months, call sooner if problems.   Sciatica Sciatica is pain, weakness, numbness, or tingling along the path of the sciatic nerve. The nerve starts in the lower back and runs down the back of each leg. The nerve controls the muscles in the lower leg and in the back of the knee, while also providing sensation to the back of the thigh, lower leg, and the sole of your foot. Sciatica is a symptom of another medical condition. For instance, nerve damage or certain conditions, such as a herniated disk or bone spur on the spine, pinch or put pressure on the sciatic nerve. This causes the pain, weakness, or other sensations normally associated with sciatica. Generally, sciatica only affects one side of the body. CAUSES   Herniated or slipped disc.  Degenerative disk disease.  A pain disorder involving the narrow muscle in the buttocks (piriformis syndrome).  Pelvic injury or fracture.  Pregnancy.  Tumor (rare). SYMPTOMS  Symptoms can vary from mild to very severe. The symptoms usually travel from the low back to the buttocks and down the back of the leg. Symptoms can  include:  Mild tingling or dull aches in the lower back, leg, or hip.  Numbness in the back of the calf or sole of the foot.  Burning sensations in the lower back, leg, or hip.  Sharp pains in the lower back, leg, or hip.  Leg weakness.  Severe back pain inhibiting movement. These symptoms may get worse with coughing, sneezing, laughing, or prolonged sitting or standing. Also, being overweight may worsen symptoms. DIAGNOSIS  Your caregiver will perform a physical exam to look for common symptoms of sciatica. He or she may ask you to do certain movements or activities that would trigger sciatic nerve pain. Other tests may be performed to find the cause of the sciatica. These may include:  Blood tests.  X-rays.  Imaging tests, such as an MRI or CT scan. TREATMENT  Treatment is directed at the cause of the sciatic pain. Sometimes, treatment is not necessary and the pain and discomfort goes away on its own. If treatment is needed, your caregiver may suggest:  Over-the-counter medicines to relieve pain.  Prescription medicines, such as anti-inflammatory medicine, muscle relaxants, or narcotics.  Applying heat or ice to the painful area.  Steroid injections to lessen pain, irritation, and inflammation around the nerve.  Reducing activity during periods of pain.  Exercising and stretching to strengthen your abdomen and improve flexibility of your spine. Your caregiver may suggest losing weight if the extra weight makes the back pain worse.  Physical therapy.  Surgery to eliminate what is pressing or pinching the  nerve, such as a bone spur or part of a herniated disk. HOME CARE INSTRUCTIONS   Only take over-the-counter or prescription medicines for pain or discomfort as directed by your caregiver.  Apply ice to the affected area for 20 minutes, 3 4 times a day for the first 48 72 hours. Then try heat in the same way.  Exercise, stretch, or perform your usual activities if these  do not aggravate your pain.  Attend physical therapy sessions as directed by your caregiver.  Keep all follow-up appointments as directed by your caregiver.  Do not wear high heels or shoes that do not provide proper support.  Check your mattress to see if it is too soft. A firm mattress may lessen your pain and discomfort. SEEK IMMEDIATE MEDICAL CARE IF:   You lose control of your bowel or bladder (incontinence).  You have increasing weakness in the lower back, pelvis, buttocks, or legs.  You have redness or swelling of your back.  You have a burning sensation when you urinate.  You have pain that gets worse when you lie down or awakens you at night.  Your pain is worse than you have experienced in the past.  Your pain is lasting longer than 4 weeks.  You are suddenly losing weight without reason. MAKE SURE YOU:  Understand these instructions.  Will watch your condition.  Will get help right away if you are not doing well or get worse. Document Released: 12/23/2000 Document Revised: 06/30/2011 Document Reviewed: 05/10/2011 [facility] Patient Information 2014 [place]. Back Exercises Back exercises help treat and prevent back injuries. The goal is to increase your strength in your belly (abdominal) and back muscles. These exercises can also help with flexibility. Start these exercises when told by your doctor. HOME CARE Back exercises include: Pelvic Tilt.  Lie on your back with your knees bent. Tilt your pelvis until the lower part of your back is against the floor. Hold this position 5 to 10 sec. Repeat this exercise 5 to 10 times. Knee to Chest.  Pull 1 knee up against your chest and hold for 20 to 30 seconds. Repeat this with the other knee. This may be done with the other leg straight or bent, whichever feels better. Then, pull both knees up against your chest. Sit-Ups or Curl-Ups.  Bend your knees 90 degrees. Start with tilting your pelvis, and do a partial,  slow sit-up. Only lift your upper half 30 to 45 degrees off the floor. Take at least 2 to 3 seonds for each sit-up. Do not do sit-ups with your knees out straight. If partial sit-ups are difficult, simply do the above but with only tightening your belly (abdominal) muscles and holding it as told. Hip-Lift.  Lie on your back with your knees flexed 90 degrees. Push down with your feet and shoulders as you raise your hips 2 inches off the floor. Hold for 10 seconds, repeat 5 to 10 times. Back Arches.  Lie on your stomach. Prop yourself up on bent elbows. Slowly press on your hands, causing an arch in your low back. Repeat 3 to 5 times. Shoulder-Lifts.  Lie face down with arms beside your body. Keep hips and belly pressed to floor as you slowly lift your head and shoulders off the floor. Do not overdo your exercises. Be careful in the beginning. Exercises may cause you some mild back discomfort. If the pain lasts for more than 15 minutes, stop the exercises until you see your doctor. Improvement with  exercise for back problems is slow.  Document Released: 01/31/2010 Document Revised: 03/23/2011 Document Reviewed: 10/30/2010 [facility] Patient Information 2014 [place], [place].

## 2013-01-20 NOTE — Progress Notes (Signed)
   Subjective:    Patient ID: Alexis Wheeler, female    DOB: 16-Oct-1955, 58 y.o.   MRN: [id number]  Hip Pain     Complains of right buttock pain Onset 2-3 weeks ago Similar pain following fall April 2014, emergency room visit for same reviewed. No specialist evaluation performed at that time Pain symptoms improved with over-the-counter anti-inflammatory  Past Medical History  Diagnosis Date  . Morbid obesity   . HEMATURIA UNSPECIFIED   . GERD   . MYOSITIS   . HYPERTENSION   . HYPERLIPIDEMIA   . Vertigo     Review of Systems  Constitutional: Negative for fever, fatigue and unexpected weight change.  Respiratory: Negative for cough and shortness of breath.   Cardiovascular: Negative for chest pain and leg swelling.       Objective:   Physical Exam BP 152/100  Pulse 70  Temp(Src) 98 F (36.7 C) (Oral)  Wt 199 lb (90.266 kg)  SpO2 99% Weight: 199 lb (90.266 kg)  Constitutional: She is overweight, but appears well-developed and well-nourished. No distress.  Cardiovascular: Normal rate, regular rhythm and normal heart sounds.  No murmur heard. No BLE edema. Pulmonary/Chest: Effort normal and breath sounds normal. No respiratory distress. She has no wheezes.  Musculoskeletal: Back: full range of motion of thoracic and lumbar spine. Non tender to palpation. Negative straight leg raise. DTR's are symmetrically intact. Sensation intact in all dermatomes of the lower extremities. Full strength to manual muscle testing. patient is able to heel toe walk without difficulty and ambulates with antalgic gait. R hip - no groin pain to palpation. Normal range of motion, including internal and external rotation. No apparent joint effusions. No gross deformities Neurological: She is alert and oriented to person, place, and time. No cranial nerve deficit. Coordination, balance, strength, speech and gait are normal.  Skin: Skin is warm and dry. No rash noted. No erythema.  Psychiatric: She  has a normal mood and affect. Her behavior is normal. Judgment and thought content normal.   Lab Results  Component Value Date   WBC 8.2 04/12/2012   HGB 14.6 04/12/2012   HCT 43.0 04/12/2012   PLT 213 04/12/2012   GLUCOSE 100* 04/12/2012   CHOL 174 07/09/2011   TRIG 61.0 07/09/2011   HDL 52.00 07/09/2011   LDLCALC 110* 07/09/2011   ALT 19 03/11/2012   AST 15 03/11/2012   NA 139 04/12/2012   K 3.8 04/12/2012   CL 101 04/12/2012   CREATININE 1.00 04/12/2012   BUN 18 04/12/2012   CO2 29 03/11/2012   TSH 0.64 03/11/2012       Assessment & Plan:   Right buttock pain x3 weeks. Patient relates to similar pain following fall approx one year ago (April 2014)  Symptoms worse with supine position and sitting overexertion. Improved with over-the-counter anti-inflammatory, but worse in past 3 weeks. Denies new injury, trauma or weakness  Exam history consistent with sciatica  Check plain film given remote trauma and evaluation for lumbar DDD  Treat conservatively with prednisone taper x6 days, Flexeril at night x1 week and prescription Naprosyn twice a day x2 weeks following prednisone  Offered physical therapy, patient elects for home exercises instead at this time  Pain symptoms unimproved in next 6 weeks, patient will notify us for further evaluation as needed. Patient agrees to call sooner if symptoms worse

## 2013-01-21 NOTE — Progress Notes (Signed)
Tried calling patient no answer & can't leave msg...Alexis Wheeler

## 2013-01-23 ENCOUNTER — Telehealth: Payer: Self-pay | Admitting: *Deleted

## 2013-01-23 NOTE — Telephone Encounter (Signed)
Patient phoned requesting her xray results.   Please advise.   CB# [phone]

## 2013-01-23 NOTE — Telephone Encounter (Signed)
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

## 2013-01-24 NOTE — Telephone Encounter (Signed)
Called pt again still no answer LMOM urgent with md response. HAVE CALLED PT ON SEVERAL OCCASSIONS NOT ANSWERING PHONE HAVE LEFT MSG 2 VOICE MSG WITH RESULTS...Alexis Wheeler

## 2013-02-10 ENCOUNTER — Other Ambulatory Visit: Payer: Self-pay

## 2013-02-10 DIAGNOSIS — Z1231 Encounter for screening mammogram for malignant neoplasm of breast: Secondary | ICD-10-CM

## 2013-02-22 ENCOUNTER — Emergency Department (HOSPITAL_COMMUNITY)
Admission: EM | Admit: 2013-02-22 | Discharge: 2013-02-23 | Disposition: A | Discharge status: Home / Self Care | Payer: BC Managed Care – PPO | Attending: Emergency Medicine | Admitting: Emergency Medicine

## 2013-02-22 ENCOUNTER — Encounter (HOSPITAL_COMMUNITY): Payer: Self-pay | Admitting: Emergency Medicine

## 2013-02-22 DIAGNOSIS — Z79899 Other long term (current) drug therapy: Secondary | ICD-10-CM | POA: Insufficient documentation

## 2013-02-22 DIAGNOSIS — Z8739 Personal history of other diseases of the musculoskeletal system and connective tissue: Secondary | ICD-10-CM | POA: Insufficient documentation

## 2013-02-22 DIAGNOSIS — E785 Hyperlipidemia, unspecified: Secondary | ICD-10-CM | POA: Insufficient documentation

## 2013-02-22 DIAGNOSIS — Z8719 Personal history of other diseases of the digestive system: Secondary | ICD-10-CM | POA: Insufficient documentation

## 2013-02-22 DIAGNOSIS — Z87448 Personal history of other diseases of urinary system: Secondary | ICD-10-CM | POA: Insufficient documentation

## 2013-02-22 DIAGNOSIS — R51 Headache: Secondary | ICD-10-CM | POA: Insufficient documentation

## 2013-02-22 DIAGNOSIS — I10 Essential (primary) hypertension: Secondary | ICD-10-CM | POA: Insufficient documentation

## 2013-02-22 DIAGNOSIS — R209 Unspecified disturbances of skin sensation: Secondary | ICD-10-CM | POA: Insufficient documentation

## 2013-02-22 DIAGNOSIS — Z791 Long term (current) use of non-steroidal anti-inflammatories (NSAID): Secondary | ICD-10-CM | POA: Insufficient documentation

## 2013-02-22 NOTE — ED Notes (Signed)
Pt states that she has not been able to take her BP meds x 2 days and started having a HA and high BP around 9pm tonight. L arm numbness.

## 2013-02-23 ENCOUNTER — Emergency Department (HOSPITAL_COMMUNITY): Payer: BC Managed Care – PPO

## 2013-02-23 LAB — CBC
HCT: 39.1 % (ref 36.0–46.0)
Hemoglobin: 12.7 g/dL (ref 12.0–15.0)
MCH: 23.2 pg — ABNORMAL LOW (ref 26.0–34.0)
MCHC: 32.5 g/dL (ref 30.0–36.0)
MCV: 71.4 fL — ABNORMAL LOW (ref 78.0–100.0)
Platelets: 195 10*3/uL (ref 150–400)
RBC: 5.48 MIL/uL — ABNORMAL HIGH (ref 3.87–5.11)
RDW: 14.9 % (ref 11.5–15.5)
WBC: 5.7 10*3/uL (ref 4.0–10.5)

## 2013-02-23 LAB — BASIC METABOLIC PANEL
BUN: 12 mg/dL (ref 6–23)
CO2: 28 mEq/L (ref 19–32)
Calcium: 8.7 mg/dL (ref 8.4–10.5)
Chloride: 99 mEq/L (ref 96–112)
Creatinine, Ser: 0.85 mg/dL (ref 0.50–1.10)
GFR calc Af Amer: 86 mL/min — ABNORMAL LOW (ref >90)
GFR calc non Af Amer: 74 mL/min — ABNORMAL LOW (ref >90)
Glucose, Bld: 112 mg/dL — ABNORMAL HIGH (ref 70–99)
Potassium: 3.4 mEq/L — ABNORMAL LOW (ref 3.7–5.3)
Sodium: 138 mEq/L (ref 137–147)

## 2013-02-23 LAB — POCT I-STAT TROPONIN I: Troponin i, poc: 0.01 ng/mL (ref 0.00–0.08)

## 2013-02-23 MED ORDER — AMLODIPINE BESYLATE 5 MG PO TABS
5.0000 mg | ORAL_TABLET | Freq: Once | ORAL | Status: AC
  Administered 2013-02-23: 5 mg via ORAL
  Filled 2013-02-23: qty 1

## 2013-02-23 NOTE — Discharge Instructions (Signed)
Is important to take her blood pressure medications as prescribed daily.  Managing Your High Blood Pressure Blood pressure is a measurement of how forceful your blood is pressing against the walls of the arteries. Arteries are muscular tubes within the circulatory system. Blood pressure does not stay the same. Blood pressure rises when you are active, excited, or nervous; and it lowers during sleep and relaxation. If the numbers measuring your blood pressure stay above normal most of the time, you are at risk for health problems. High blood pressure (hypertension) is a long-term (chronic) condition in which blood pressure is elevated. A blood pressure reading is recorded as two numbers, such as 120 over 80 (or 120/80). The first, higher number is called the systolic pressure. It is a measure of the pressure in your arteries as the heart beats. The second, lower number is called the diastolic pressure. It is a measure of the pressure in your arteries as the heart relaxes between beats.  Keeping your blood pressure in a normal range is important to your overall health and prevention of health problems, such as heart disease and stroke. When your blood pressure is uncontrolled, your heart has to work harder than normal. High blood pressure is a very common condition in adults because blood pressure tends to rise with age. Men and women are equally likely to have hypertension but at different times in life. Before age 7, men are more likely to have hypertension. After 59 years of age, women are more likely to have it. Hypertension is especially common in African Americans. This condition often has no signs or symptoms. The cause of the condition is usually not known. Your caregiver can help you come up with a plan to keep your blood pressure in a normal, healthy range. BLOOD PRESSURE STAGES Blood pressure is classified into four stages: normal, prehypertension, stage 1, and stage 2. Your blood pressure reading  will be used to determine what type of treatment, if any, is necessary. Appropriate treatment options are tied to these four stages:  Normal  Systolic pressure (mm Hg): below 120.  Diastolic pressure (mm Hg): below 80. Prehypertension  Systolic pressure (mm Hg): 120 to 139.  Diastolic pressure (mm Hg): 80 to 89. Stage1  Systolic pressure (mm Hg): 140 to 159.  Diastolic pressure (mm Hg): 90 to 99. Stage2  Systolic pressure (mm Hg): 160 or above.  Diastolic pressure (mm Hg): 100 or above. RISKS RELATED TO HIGH BLOOD PRESSURE Managing your blood pressure is an important responsibility. Uncontrolled high blood pressure can lead to:  A heart attack.  A stroke.  A weakened blood vessel (aneurysm).  Heart failure.  Kidney damage.  Eye damage.  Metabolic syndrome.  Memory and concentration problems. HOW TO MANAGE YOUR BLOOD PRESSURE Blood pressure can be managed effectively with lifestyle changes and medicines (if needed). Your caregiver will help you come up with a plan to bring your blood pressure within a normal range. Your plan should include the following: Education  Read all information provided by your caregivers about how to control blood pressure.  Educate yourself on the latest guidelines and treatment recommendations. New research is always being done to further define the risks and treatments for high blood pressure. Lifestylechanges  Control your weight.  Avoid smoking.  Stay physically active.  Reduce the amount of salt in your diet.  Reduce stress.  Control any chronic conditions, such as high cholesterol or diabetes.  Reduce your alcohol intake. Medicines  Several medicines (antihypertensive medicines)  are available, if needed, to bring blood pressure within a normal range. Communication  Review all the medicines you take with your caregiver because there may be side effects or interactions.  Talk with your caregiver about your diet,  exercise habits, and other lifestyle factors that may be contributing to high blood pressure.  See your caregiver regularly. Your caregiver can help you create and adjust your plan for managing high blood pressure. RECOMMENDATIONS FOR TREATMENT AND FOLLOW-UP  The following recommendations are based on current guidelines for managing high blood pressure in nonpregnant adults. Use these recommendations to identify the proper follow-up period or treatment option based on your blood pressure reading. You can discuss these options with your caregiver.  Systolic pressure of 443 to 154 or diastolic pressure of 80 to 89: Follow up with your caregiver as directed.  Systolic pressure of 008 to 676 or diastolic pressure of 90 to 100: Follow up with your caregiver within 2 months.  Systolic pressure above 195 or diastolic pressure above 093: Follow up with your caregiver within 1 month.  Systolic pressure above 267 or diastolic pressure above 124: Consider antihypertensive therapy; follow up with your caregiver within 1 week.  Systolic pressure above 580 or diastolic pressure above 998: Begin antihypertensive therapy; follow up with your caregiver within 1 week. Document Released: 09/23/2011 Document Reviewed: 09/23/2011 [facility] Patient Information 2014 [place], [place].  Hypertension As your heart beats, it forces blood through your arteries. This force is your blood pressure. If the pressure is too high, it is called hypertension (HTN) or high blood pressure. HTN is dangerous because you may have it and not know it. High blood pressure may mean that your heart has to work harder to pump blood. Your arteries may be narrow or stiff. The extra work puts you at risk for heart disease, stroke, and other problems.  Blood pressure consists of two numbers, a higher number over a lower, 110/72, for example. It is stated as "110 over 72." The ideal is below 120 for the top number (systolic) and under 80 for the  bottom (diastolic). Write down your blood pressure today. You should pay close attention to your blood pressure if you have certain conditions such as:  Heart failure.  Prior heart attack.  Diabetes  Chronic kidney disease.  Prior stroke.  Multiple risk factors for heart disease. To see if you have HTN, your blood pressure should be measured while you are seated with your arm held at the level of the heart. It should be measured at least twice. A one-time elevated blood pressure reading (especially in the Emergency Department) does not mean that you need treatment. There may be conditions in which the blood pressure is different between your right and left arms. It is important to see your caregiver soon for a recheck. Most people have essential hypertension which means that there is not a specific cause. This type of high blood pressure may be lowered by changing lifestyle factors such as:  Stress.  Smoking.  Lack of exercise.  Excessive weight.  Drug/tobacco/alcohol use.  Eating less salt. Most people do not have symptoms from high blood pressure until it has caused damage to the body. Effective treatment can often prevent, delay or reduce that damage. TREATMENT  When a cause has been identified, treatment for high blood pressure is directed at the cause. There are a large number of medications to treat HTN. These fall into several categories, and your caregiver will help you select the medicines  that are best for you. Medications may have side effects. You should review side effects with your caregiver. If your blood pressure stays high after you have made lifestyle changes or started on medicines,   Your medication(s) may need to be changed.  Other problems may need to be addressed.  Be certain you understand your prescriptions, and know how and when to take your medicine.  Be sure to follow up with your caregiver within the time frame advised (usually within two weeks) to  have your blood pressure rechecked and to review your medications.  If you are taking more than one medicine to lower your blood pressure, make sure you know how and at what times they should be taken. Taking two medicines at the same time can result in blood pressure that is too low. SEEK IMMEDIATE MEDICAL CARE IF:  You develop a severe headache, blurred or changing vision, or confusion.  You have unusual weakness or numbness, or a faint feeling.  You have severe chest or abdominal pain, vomiting, or breathing problems. MAKE SURE YOU:   Understand these instructions.  Will watch your condition.  Will get help right away if you are not doing well or get worse. Document Released: 12/29/2004 Document Revised: 03/23/2011 Document Reviewed: 08/19/2007 [facility] Patient Information 2014 [place].

## 2013-02-23 NOTE — ED Notes (Signed)
Patient transported to X-ray 

## 2013-02-23 NOTE — ED Notes (Signed)
PA at bedside.

## 2013-02-23 NOTE — ED Provider Notes (Signed)
Medical screening examination/treatment/procedure(s) were performed by non-physician practitioner and as supervising physician I was immediately available for consultation/collaboration.  Corlette Ciano, MD 02/23/13 0633 

## 2013-02-23 NOTE — ED Provider Notes (Signed)
CSN: [id number]     Arrival date & time 02/22/13  2247 History   First MD Initiated Contact with Patient 02/23/13 0043     Chief Complaint  Patient presents with  . Hypertension  . Headache     (Consider location/radiation/quality/duration/timing/severity/associated sxs/prior Treatment) HPI Comments: Patient is 58 year old female with a past medical history of hypertension who presents to the emergency department with concerns of hypertension. Patient states she has not had her blood pressure medications for the past 2 days because she cannot pick up until Friday when she gets pain. She takes Norvasc 5mg  daily. Yesterday evening while at work around 9:00 PM she developed a headache and could "tell her bp was high". Headache lasted about an hour and subsided when she rested. Admits to associated left arm tingling. Denies chest pain, shortness of breath, vision changes, dizziness, lightheadedness nausea or vomiting.  Patient is a 58 y.o. female presenting with hypertension and headaches. The history is provided by the patient.  Hypertension Associated symptoms include headaches.  Headache   Past Medical History  Diagnosis Date  . Morbid obesity   . HEMATURIA UNSPECIFIED   . GERD   . MYOSITIS   . HYPERTENSION   . HYPERLIPIDEMIA   . Vertigo    Past Surgical History  Procedure Laterality Date  . Cholecystectomy  2001  . Abdominal hysterectomy  1983   Family History  Problem Relation Age of Onset  . Breast cancer Mother 72  . Breast cancer Sister   . Throat cancer Brother     1 bro living   History  Substance Use Topics  . Smoking status: Never Smoker   . 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 Alcohol Use: No   OB History   Grav Para Term Preterm Abortions TAB SAB Ect Mult Living                 Review of Systems  Neurological: Positive for headaches.       Positive  for left arm tingling.  All other systems reviewed and are negative.      Allergies  Ibuprofen  Home Medications   Current Outpatient Rx  Name  Route  Sig  Dispense  Refill  . amLODipine (NORVASC) 5 MG tablet      TAKE ONE TABLET BY MOUTH ONCE DAILY   30 tablet   11   . Ascorbic Acid (VITAMIN C) 500 MG tablet   Oral   Take 500 mg by mouth daily.           . Cholecalciferol (VITAMIN D3) 1000 UNITS CAPS   Oral   Take 1,000 Units by mouth daily.           . cyclobenzaprine (FLEXERIL) 5 MG tablet   Oral   Take 1 tablet (5 mg total) by mouth at bedtime. X 1 week, then as needed for pain/spasm   30 tablet   1   . loratadine (CLARITIN) 10 MG tablet   Oral   Take 1 tablet (10 mg total) by mouth daily.   30 tablet   2   . naproxen (NAPROSYN) 500 MG tablet   Oral   Take 1 tablet (500 mg total) by mouth 2 (two) times daily with a meal. Start AFTER you are done with Predsnisone   60 tablet   0   . triamterene-hydrochlorothiazide (  MAXZIDE-25) 37.5-25 MG per tablet   Oral   Take 1 tablet by mouth daily before breakfast.          BP 167/102  Pulse 62  Temp(Src) 98.5 F (36.9 C) (Oral)  SpO2 98% Physical Exam  Nursing note and vitals reviewed. Constitutional: She is oriented to person, place, and time. She appears well-developed and well-nourished. No distress.  HENT:  Head: Normocephalic and atraumatic.  Mouth/Throat: Oropharynx is clear and moist.  Eyes: Conjunctivae and EOM are normal. Pupils are equal, round, and reactive to light.  Neck: Normal range of motion. Neck supple. No JVD present.  Cardiovascular: Normal rate, regular rhythm, normal heart sounds and intact distal pulses.   No extremity edema. Equal distal pulses.  Pulmonary/Chest: Effort normal and breath sounds normal.  Abdominal: Soft. Bowel sounds are normal. There is no tenderness.  Musculoskeletal: Normal range of motion. She exhibits no edema.  Neurological: She is alert and oriented to  person, place, and time. She has normal strength. No sensory deficit.  Moves limbs without ataxia. Speech fluent, goal oriented. Equal grip strength.  Skin: Skin is warm and dry. She is not diaphoretic.  Psychiatric: She has a normal mood and affect. Her behavior is normal.    ED Course  Procedures (including critical care time) Labs Review Labs Reviewed  CBC - Abnormal; Notable for the following:    RBC 5.48 (*)    MCV 71.4 (*)    MCH 23.2 (*)    All other components within normal limits  BASIC METABOLIC PANEL - Abnormal; Notable for the following:    Potassium 3.4 (*)    Glucose, Bld 112 (*)    GFR calc non Af Amer 74 (*)    GFR calc Af Amer 86 (*)    All other components within normal limits  POCT I-STAT TROPONIN I   Imaging Review Dg Chest 2 View  02/23/2013   CLINICAL DATA:  Hypertension.  Headache.  EXAM: CHEST  2 VIEW  COMPARISON:  None.  FINDINGS: Cardiopericardial silhouette within normal limits. Mediastinal contours normal. Trachea midline. No airspace disease or effusion. Monitoring leads project over the chest.  IMPRESSION: No active cardiopulmonary disease.   Electronically Signed   By: Dereck Ligas M.D.   On: 02/23/2013 02:04    EKG Interpretation   None      Date: 02/23/2013  Rate: 62  Rhythm: normal sinus rhythm  QRS Axis: normal  Intervals: borderline prolonged QT  ST/T Wave abnormalities: nonspecific T wave changes  Conduction Disutrbances:none  Narrative Interpretation: Sinus rhythm, probable left atrial enlargement, left ventricular hypertrophy, borderline prolonged QT interval, no significant changes since EKG on 04/12/2012  Old EKG Reviewed: unchanged    MDM   Final diagnoses:  Hypertension    Patient presenting with hypertension, headache and left arm tingling. She is well appearing and in no apparent distress, asymptomatic in the emergency department. Blood pressure 167/102 on arrival. She has not had her Norvasc in 2 days. Norvasc given  in the emergency department. No EKG changes. Chest x-ray clear. Troponin negative. Patient is stable for discharge home, followup with her PCP. Discussed importance of medication compliance. Return precautions given. Patient states understanding of treatment care plan and is agreeable.     Illene Labrador, PA-C 02/23/13 [phone]

## 2013-03-03 ENCOUNTER — Ambulatory Visit
Admission: RE | Admit: 2013-03-03 | Discharge: 2013-03-03 | Disposition: A | Discharge status: Home / Self Care | Payer: BC Managed Care – PPO | Source: Ambulatory Visit

## 2013-03-03 DIAGNOSIS — Z1231 Encounter for screening mammogram for malignant neoplasm of breast: Secondary | ICD-10-CM

## 2013-04-07 ENCOUNTER — Encounter: Payer: Self-pay | Admitting: Internal Medicine

## 2013-04-07 ENCOUNTER — Ambulatory Visit (INDEPENDENT_AMBULATORY_CARE_PROVIDER_SITE_OTHER): Payer: BC Managed Care – PPO | Admitting: Internal Medicine

## 2013-04-07 VITALS — BP 130/90 | HR 94 | Temp 98.3°F | Resp 16

## 2013-04-07 DIAGNOSIS — K2960 Other gastritis without bleeding: Secondary | ICD-10-CM

## 2013-04-07 DIAGNOSIS — K219 Gastro-esophageal reflux disease without esophagitis: Secondary | ICD-10-CM

## 2013-04-07 DIAGNOSIS — IMO0002 Reserved for concepts with insufficient information to code with codable children: Secondary | ICD-10-CM

## 2013-04-07 DIAGNOSIS — T39395A Adverse effect of other nonsteroidal anti-inflammatory drugs [NSAID], initial encounter: Secondary | ICD-10-CM

## 2013-04-07 DIAGNOSIS — T398X5A Adverse effect of other nonopioid analgesics and antipyretics, not elsewhere classified, initial encounter: Secondary | ICD-10-CM

## 2013-04-07 DIAGNOSIS — T3995XA Adverse effect of unspecified nonopioid analgesic, antipyretic and antirheumatic, initial encounter: Secondary | ICD-10-CM

## 2013-04-07 DIAGNOSIS — M5414 Radiculopathy, thoracic region: Secondary | ICD-10-CM

## 2013-04-07 MED ORDER — TRAMADOL HCL 50 MG PO TABS: 50.0000 mg | ORAL_TABLET | Freq: Three times a day (TID) | ORAL | Status: DC | PRN

## 2013-04-07 NOTE — Progress Notes (Signed)
   Subjective:    Patient ID: Alexis Wheeler, female    DOB: 02/20/1955, 58 y.o.   MRN: [id number]  HPI Symptoms began on Wednesday night as intermittent sharp epigastric pain; uncertain if food intake is a trigger.  History is significant for GERD; she has not taken omeprazole consistently. Reports she took it twice last week but does not feel it helps.  Pain is unrelieved by Tylenol. She began probiotic last Friday. Last BM was today, reports small.   She is also having L mid-abdomen side pain, which she describes as similar to pain she felt with prior kidney stone.  History significant for lithotripsy in 2013. 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     Review of Systems Denies fever, chills, nausea, vomiting, melena, dark, tarry stools. She denies numbness, tingling in lower extremities or loss of bladder/bowel functions.     Objective:   Physical Exam General appearance is one of good health and nourishment w/o distress. Eyes: No conjunctival inflammation or scleral icterus is present. Oral exam: Dental hygiene is good; lips and gums are healthy appearing.There is no oropharyngeal erythema or exudate noted.  Heart:  Normal rate and regular rhythm. Accentuated S2 normal without gallop, murmur, click, rub or other extra sounds.  Lungs:Chest clear to auscultation; no wheezes, rhonchi,rales ,or rubs present.No increased work of breathing.  Abdomen: hypoactive bowel sounds normal, soft and non-tender without masses, organomegaly or hernias noted.  No guarding or rebound. No tenderness over the flanks to percussion Musculoskeletal: Able to lie flat and sit up without help. Negative straight leg raising bilaterally. Gait normal. Skin:Warm & dry.  Intact without suspicious lesions or rashes ; no jaundice or tenting Lymphatic: No lymphadenopathy is noted about the head, neck, axilla, or inguinal areas.      Assessment & Plan:  #1 GERD -  continue omeprazole  #2  T10 nerve compromise (?) - Sed Rate, BMP , CBC c diff

## 2013-04-07 NOTE — Progress Notes (Signed)
   Subjective:    Patient ID: Alexis Wheeler, female    DOB: 1955/05/11, 58 y.o.   MRN: [id number]  HPI Symptoms began on Wednesday night as intermittent sharp epigastric pain; uncertain if food intake was a trigger.  History is significant for GERD; she has not taken omeprazole consistently. Reports she took it twice last week but does not feel it helps.  Pain is unrelieved by Tylenol and Motrin. She began taking 400 mg , up to 2 bid.   She is also having L mid-abdomen side pain, which she describes as similar to pain she felt with prior kidney stone.  She also  began probiotic last Friday. Last BM was today, reports stool caliber is  small.   History significant for lithotripsy in 2013. 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    Review of Systems Denies fever, chills, nausea, vomiting, melena, dark, tarry stools. She denies numbness, tingling, or weakness  in lower extremities or loss of bladder/bowel functions.       Objective:   Physical Exam General appearance:well nourished; w/o distress.  Eyes: No conjunctival inflammation or scleral icterus is present.  Oral exam: Dental hygiene is good; lips and gums are healthy appearing.There is no oropharyngeal erythema or exudate noted.  Heart: Normal rate and regular rhythm. Accentuated S2  without gallop, murmur, click, rub or other extra sounds.  Lungs:Chest clear to auscultation; no wheezes, rhonchi,rales ,or rubs present.No increased work of breathing.  Abdomen: hypoactive bowel sounds normal, soft and non-tender without masses, organomegaly or hernias noted. No guarding or rebound.  No tenderness over the flanks to percussion  Musculoskeletal: Able to lie flat and sit up without help. Negative straight leg raising bilaterally. Gait normal.  Skin:Warm & dry. Intact without suspicious lesions or rashes ; no jaundice or tenting  Lymphatic: No lymphadenopathy is noted about the head,  neck, axilla    Assessment & Plan:  #1 GERD , probable exacerbation from NSAIDS taken for #2  #2 T10  Radiculopathy in context of remote injury post work related  repetitive motion #3 PMH of renal calculi & lithotripsy See recommendations & AVS

## 2013-04-07 NOTE — Patient Instructions (Signed)
Reflux of gastric acid may be asymptomatic as this may occur mainly during sleep.The triggers for reflux  include stress; the "aspirin family" ; alcohol; peppermint; and caffeine (coffee, tea, cola, and chocolate). The aspirin family would include aspirin and the nonsteroidal agents such as ibuprofen &  Naproxen. Tylenol would not cause reflux. If having symptoms ; food & drink should be avoided for @ least 2 hours before going to bed.  Until the pain resolves; take the protein pump inhibitor 30 minutes before breakfast and 30 minutes before the evening meal.

## 2013-04-17 ENCOUNTER — Ambulatory Visit: Payer: BC Managed Care – PPO | Admitting: Internal Medicine

## 2013-04-28 ENCOUNTER — Other Ambulatory Visit: Payer: Self-pay | Admitting: Internal Medicine

## 2013-07-20 ENCOUNTER — Telehealth: Payer: Self-pay | Admitting: *Deleted

## 2013-07-20 NOTE — Telephone Encounter (Signed)
Left msg on triage still having issues with her stomach. Called pt back no answer LMOM will need to make f/u appt with Dr. Asa Lente...Alexis Wheeler

## 2013-09-19 ENCOUNTER — Ambulatory Visit: Payer: BC Managed Care – PPO | Admitting: Internal Medicine

## 2013-09-26 ENCOUNTER — Ambulatory Visit (INDEPENDENT_AMBULATORY_CARE_PROVIDER_SITE_OTHER): Payer: BC Managed Care – PPO | Admitting: Internal Medicine

## 2013-09-26 ENCOUNTER — Encounter: Payer: Self-pay | Admitting: Internal Medicine

## 2013-09-26 VITALS — BP 160/110 | HR 72 | Temp 98.2°F | Wt 202.5 lb

## 2013-09-26 DIAGNOSIS — M25551 Pain in right hip: Principal | ICD-10-CM

## 2013-09-26 DIAGNOSIS — M545 Low back pain, unspecified: Secondary | ICD-10-CM

## 2013-09-26 DIAGNOSIS — G8929 Other chronic pain: Secondary | ICD-10-CM

## 2013-09-26 DIAGNOSIS — M25559 Pain in unspecified hip: Secondary | ICD-10-CM

## 2013-09-26 DIAGNOSIS — I10 Essential (primary) hypertension: Secondary | ICD-10-CM

## 2013-09-26 NOTE — Progress Notes (Signed)
Pre visit review using our clinic review tool, if applicable. No additional management support is needed unless otherwise documented below in the visit note. 

## 2013-09-26 NOTE — Assessment & Plan Note (Signed)
Blood pressure goals and risks discussed.

## 2013-09-26 NOTE — Patient Instructions (Addendum)
The Physical Therapy & Sports Medicine  referrals will be scheduled and you'll be notified of the time. The best exercises for the low back include freestyle swimming, stretch aerobics, and yoga.  Minimal Blood Pressure Goal= AVERAGE < 140/90;  Ideal is an AVERAGE < 135/85. This AVERAGE should be calculated from @ least 5-7 BP readings taken @ different times of day on different days of week. You should not respond to isolated BP readings , but rather the AVERAGE for that week .Please bring your  blood pressure cuff to office visits to verify that it is reliable.It  can also be checked against the blood pressure device at the pharmacy. Finger or wrist cuffs are not dependable; an arm cuff is.

## 2013-09-26 NOTE — Assessment & Plan Note (Signed)
Physical therapy referral to minimize long-term compromise.

## 2013-09-26 NOTE — Assessment & Plan Note (Signed)
Consult Dr. Gardenia Phlegm; rule out bursitis

## 2013-09-26 NOTE — Progress Notes (Signed)
   Subjective:    Patient ID: Alexis Wheeler, female    DOB: 05-Oct-1955, 58 y.o.   MRN: [id number]  HPI   At this time she plans to return to work 10/24/13 at [employer] She's been out of work because of the chronic low back symptoms. She is now working sitting with a relative.  Her original issues began in 42s when she was working in another facility.  Her job as a Occupational hygienist and moving patients.  Her films 01/20/13 revealed 4 lumbar type vertebrae. She had chronic mild grade 1 L4-L5 anteriolisthesis with advanced chronic facet arthropathy. She had moderate L5-S1 chronic facet arthropathy.  Films of the hip post  falling on ice were negative for any bony abnormalities  She has been taking tramadol for the hip which helps but she has pain when she lies in the right lateral decubitus position at night  The back pain has  obviously improved not lifting. She been taking cyclobenzaprine 5 mg at bedtime as well as naproxen.  She forgot her blood pressure medicines this morning. Typically her blood pressure runs 130/80-90. She is not on a heart healthy diet but does not eat salt.   Review of Systems  She denies associated numbness, tingling, weakness in the legs.  She has not lost control of bladder or bowels.   She denies epistaxis, significant headaches, chest pain, palpitations, edema, claudication, or paroxysmal nocturnal dyspnea.         Objective:   Physical Exam  Pertinent positive findings include: She is able to lie flat and sit up without help.  She has negative straight leg raising except for some mild right posterior thigh discomfort with straight leg raising to almost 90. There is no pain to rotation of the right hip or with percussion. She is able to walk on her tiptoes and heels without difficulty.  Appears healthy and well-nourished & in no acute distress.  As per CDC Guidelines ,Epic documents obesity as being present . No carotid bruits are  present.No neck pain distention present at 10 - 15 degrees. Thyroid normal to palpation Heart rhythm and rate are normal with no gallop or murmur Chest is clear with no increased work of breathing There is no evidence of aortic aneurysm or renal artery bruits Abdomen soft with no organomegaly or masses. No HJR. No flank tenderness No clubbing, cyanosis or edema present. Pedal pulses are intact  No ischemic skin changes are present . Fingernails healthy Alert and oriented. Strength, tone, DTRs reflexes normal          Assessment & Plan:  #1 chronic low back pain related to documented anteriolisthesis and chronic facet arthropathy. This was discussed with her. Physical therapy and low back exercises were recommended to prevent long-term compromise  #2 right hip pain only in the right lateral decubitus position with sleep. Her exam and history suggest probable bursitis. Assessment by Dr. Charlann Boxer would be recommended.  #3 hypertension; she did not take her medicines today. Blood pressure goals and risk discussed.

## 2013-10-10 ENCOUNTER — Other Ambulatory Visit (INDEPENDENT_AMBULATORY_CARE_PROVIDER_SITE_OTHER): Payer: BC Managed Care – PPO

## 2013-10-10 ENCOUNTER — Ambulatory Visit (INDEPENDENT_AMBULATORY_CARE_PROVIDER_SITE_OTHER): Payer: BC Managed Care – PPO | Admitting: Family Medicine

## 2013-10-10 ENCOUNTER — Encounter: Payer: Self-pay | Admitting: Family Medicine

## 2013-10-10 VITALS — BP 138/86 | HR 81 | Ht 64.0 in | Wt 202.0 lb

## 2013-10-10 DIAGNOSIS — M76899 Other specified enthesopathies of unspecified lower limb, excluding foot: Secondary | ICD-10-CM

## 2013-10-10 DIAGNOSIS — M25551 Pain in right hip: Secondary | ICD-10-CM

## 2013-10-10 DIAGNOSIS — M25559 Pain in unspecified hip: Secondary | ICD-10-CM

## 2013-10-10 DIAGNOSIS — M7061 Trochanteric bursitis, right hip: Secondary | ICD-10-CM | POA: Insufficient documentation

## 2013-10-10 NOTE — Progress Notes (Signed)
[facility] [place], Alexis Wheeler [address] Phone: [phone] Subjective:    I'm seeing this patient by the request  of:   Hopper MD  CC: Right lateral hip pain  [id number] [name] is a 58 y.o. female coming in with complaint of right lateral hip pain. Patient states that this has been going on for approximately 2 months. Patient states that she did not recover any injury. All the pain seems to be on the lateral aspect and denies any significant groin pain. Denies any radiation down the leg or numbness. Hurts more when she lays on that side at night. Patient states that she's had a long time and tries to stand up she can have pain as well and then when she walks it seems to get better. Patient is a severity is 6/10. Patient has not tried any home modalities.     Past medical history, social, surgical and family history all reviewed in electronic medical record.   Review of Systems: No headache, visual changes, nausea, vomiting, diarrhea, constipation, dizziness, abdominal pain, skin rash, fevers, chills, night sweats, weight loss, swollen lymph nodes, body aches, joint swelling, muscle aches, chest pain, shortness of breath, mood changes.   Objective Blood pressure 138/86, pulse 81, height 5\' 4"  (1.626 m), weight 202 lb (91.627 kg), SpO2 98.00%.  General: No apparent distress alert and oriented x3 mood and affect normal, dressed appropriately.  HEENT: Pupils equal, extraocular movements intact  Respiratory: Patient's speak in full sentences and does not appear short of breath  Cardiovascular: No lower extremity edema, non tender, no erythema  Skin: Warm dry intact with no signs of infection or rash on extremities or on axial skeleton.  Abdomen: Soft nontender  Neuro: Cranial nerves II through XII are intact, neurovascularly intact in all extremities with 2+ DTRs and 2+ pulses.  Lymph: No lymphadenopathy of posterior or anterior  cervical chain or axillae bilaterally.  Gait normal with good balance and coordination.  MSK:  Non tender with full range of motion and good stability and symmetric strength and tone of shoulders, elbows, wrist,  knee and ankles bilaterally.   Hip: Right ROM IR: 25 Deg, ER: 35 Deg, Flexion: 120 Deg, Extension: 100 Deg, Abduction: 45 Deg, Adduction: 35 Deg Strength IR: 4/5, ER: 5/5, Flexion: 5/5, Extension: 5/5, Abduction: 3+/5, Adduction: 5/5 Pelvic alignment unremarkable to inspection and palpation. Standing hip rotation and gait without trendelenburg sign / unsteadiness. Greater trochanter was severe tenderness to palpation No tenderness over piriformis and greater trochanter. Positive Faber No SI joint tenderness and normal minimal SI movement. Contralateral hip  MSK US performed of: Right hip This study was ordered, performed, and interpreted by Charlann Boxer D.O.  Hip: Significant trochanteric bursa with hypoechoic changes noted. Acetabular labrum visualized and without tears, displacement, or effusion in joint. Femoral neck appears unremarkable without increased power doppler signal along Cortex.  IMPRESSION:  Greater trochanteric bursitis   Procedure: Real-time Ultrasound Guided Injection of right greater trochanteric bursitis secondary to patient's body habitus Device: GE Logiq E  Ultrasound guided injection is preferred based studies that show increased duration, increased effect, greater accuracy, decreased procedural pain, increased response rate, and decreased cost with ultrasound guided versus blind injection.  Verbal informed consent obtained.  Time-out conducted.  Noted no overlying erythema, induration, or other signs of local infection.  Skin prepped in a sterile fashion.  Local anesthesia: Topical Ethyl chloride.  With sterile technique and under real time ultrasound guidance:  Greater trochanteric area was visualized and patient's bursa was noted. A 22-gauge 3 inch  needle was inserted and 4 cc of 0.5% Marcaine and 1 cc of Kenalog 40 mg/dL was injected. Pictures taken Completed without difficulty  Pain immediately resolved suggesting accurate placement of the medication.  Advised to call if fevers/chills, erythema, induration, drainage, or persistent bleeding.  Images permanently stored and available for review in the ultrasound unit.  Impression: Technically successful ultrasound guided injection.     Impression and Recommendations:     This case required medical decision making of moderate complexity.

## 2013-10-10 NOTE — Patient Instructions (Signed)
Very nice to meet you Ice 20 minutes 2 times daily. Usually after activity and before bed. Exercises 3 times a week.  Exercises on wall.  Heel and butt touching.  Raise leg 6 inches and hold 2 seconds.  Down slow for count of 4 seconds.  1 set of 30 reps daily on both sides.  Vitamin D 2000 IU daily Turmeric 500mg  twice daily Continue the naproxen and the muscle relaxer as needed.  Come back in 3 weeks to make sure you are better.

## 2013-10-10 NOTE — Assessment & Plan Note (Signed)
Patient's history, physical exam, an ultrasound to go with a greater trochanteric bursitis. Patient was given an injection today and states significant improvement. We discussed meds and protocol as well as potentially topical anti-inflammatories I will be beneficial. We discussed over-the-counter medications he can decrease inflammation as well. We gave patient a home exercises that will help prevent this again. Patient will come back again in 3-4 weeks for further evaluation. Differential also includes a radiculopathy from patient's chronic low back pain and we'll continue to monitor closely. We'll discuss further at followup.

## 2013-10-31 ENCOUNTER — Ambulatory Visit: Payer: BC Managed Care – PPO | Admitting: Family Medicine

## 2013-12-11 ENCOUNTER — Ambulatory Visit: Payer: BC Managed Care – PPO | Admitting: Internal Medicine

## 2013-12-14 ENCOUNTER — Encounter (HOSPITAL_COMMUNITY): Payer: Self-pay | Admitting: Emergency Medicine

## 2013-12-14 ENCOUNTER — Emergency Department (HOSPITAL_COMMUNITY)
Admission: EM | Admit: 2013-12-14 | Discharge: 2013-12-14 | Disposition: A | Discharge status: Home / Self Care | Payer: BC Managed Care – PPO | Attending: Emergency Medicine | Admitting: Emergency Medicine

## 2013-12-14 DIAGNOSIS — Z79899 Other long term (current) drug therapy: Secondary | ICD-10-CM | POA: Insufficient documentation

## 2013-12-14 DIAGNOSIS — Z8739 Personal history of other diseases of the musculoskeletal system and connective tissue: Secondary | ICD-10-CM | POA: Diagnosis not present

## 2013-12-14 DIAGNOSIS — Z8719 Personal history of other diseases of the digestive system: Secondary | ICD-10-CM | POA: Diagnosis not present

## 2013-12-14 DIAGNOSIS — I10 Essential (primary) hypertension: Secondary | ICD-10-CM | POA: Insufficient documentation

## 2013-12-14 DIAGNOSIS — R040 Epistaxis: Secondary | ICD-10-CM | POA: Diagnosis not present

## 2013-12-14 DIAGNOSIS — Z791 Long term (current) use of non-steroidal anti-inflammatories (NSAID): Secondary | ICD-10-CM | POA: Diagnosis not present

## 2013-12-14 NOTE — ED Provider Notes (Signed)
CSN: [id number]     Arrival date & time 12/14/13  0455 History   First MD Initiated Contact with Patient 12/14/13 0536     Chief Complaint  Patient presents with  . Epistaxis     (Consider location/radiation/quality/duration/timing/severity/associated sxs/prior Treatment) HPI Patient developed nosebleed from right nare 4:30 AM today lasted 10 minutes and resolved after she treated herself by pinching her nostrils shut. She had a similar episode 3 days ago which also resolved after 10 minutes. She is asymptomatic presently. Denies pain anywhere. No dyspnea no other associated symptoms.  Past Medical History  Diagnosis Date  . Morbid obesity   . HEMATURIA UNSPECIFIED   . GERD   . MYOSITIS   . HYPERTENSION   . HYPERLIPIDEMIA   . Vertigo    Past Surgical History  Procedure Laterality Date  . Cholecystectomy  2001  . Abdominal hysterectomy  1983   Family History  Problem Relation Age of Onset  . Breast cancer Mother 70  . Breast cancer Sister   . Throat cancer Brother     1 bro living   History  Substance Use Topics  . Smoking status: Never Smoker   . 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 Alcohol Use: No   OB History    No data available     Review of Systems  Constitutional: Negative.   HENT: Positive for nosebleeds.   Respiratory: Negative.   Cardiovascular: Negative.   Gastrointestinal: Negative.   Musculoskeletal: Negative.   Skin: Negative.   Neurological: Negative.   Hematological: Negative.   Psychiatric/Behavioral: Negative.   All other systems reviewed and are negative.     Allergies  Ibuprofen  Home Medications   Prior to Admission medications   Medication Sig Start Date End Date Taking? Authorizing Provider  amLODipine (NORVASC) 5 MG tablet TAKE ONE TABLET BY MOUTH ONCE DAILY 12/09/12   Rowe Clack, MD  Ascorbic Acid (VITAMIN C) 500  MG tablet Take 500 mg by mouth daily.      Historical Provider, MD  Cholecalciferol (VITAMIN D3) 1000 UNITS CAPS Take 1,000 Units by mouth daily.      Historical Provider, MD  cyclobenzaprine (FLEXERIL) 5 MG tablet Take 1 tablet (5 mg total) by mouth at bedtime. X 1 week, then as needed for pain/spasm 01/20/13 01/20/14  Rowe Clack, MD  loratadine (CLARITIN) 10 MG tablet Take 1 tablet (10 mg total) by mouth daily. 08/29/12   Rowe Clack, MD  naproxen (NAPROSYN) 500 MG tablet Take 1 tablet (500 mg total) by mouth 2 (two) times daily with a meal. Start AFTER you are done with Predsnisone 01/20/13   Rowe Clack, MD  traMADol (ULTRAM) 50 MG tablet Take 1 tablet (50 mg total) by mouth every 8 (eight) hours as needed. 04/07/13   Hendricks Limes, MD  triamterene-hydrochlorothiazide (MAXZIDE-25) 37.5-25 MG per tablet Take 1 tablet by mouth daily before breakfast.    Historical Provider, MD   BP 173/95 mmHg  Pulse 73  Temp(Src) 97.8 F (36.6 C) (Oral)  Resp 16  Ht 5\' 4"  (1.626 m)  Wt 203 lb (92.08 kg)  BMI 34.83 kg/m2  SpO2 98% Physical Exam  Constitutional: She appears well-developed and well-nourished.  HENT:  Head: Normocephalic and atraumatic.  Right Ear: External ear normal.  Left Ear: External ear normal.  Mouth/Throat: Oropharynx is clear  and moist.  No suspected using fiberoptic headlamp with nasal speculum. No bleeding site visualized. Slight amount of dried blood in right nare  Eyes: Conjunctivae are normal. Pupils are equal, round, and reactive to light.  Neck: Neck supple. No tracheal deviation present. No thyromegaly present.  Cardiovascular: Normal rate and regular rhythm.   No murmur heard. Pulmonary/Chest: Effort normal and breath sounds normal.  Abdominal: Soft. Bowel sounds are normal. She exhibits no distension. There is no tenderness.  Musculoskeletal: Normal range of motion. She exhibits no edema or tenderness.  Neurological: She is alert. Coordination  normal.  Skin: Skin is warm and dry. No rash noted.  Psychiatric: She has a normal mood and affect.  Nursing note and vitals reviewed.   ED Course  Procedures (including critical care time) Labs Review Labs Reviewed - No data to display  Imaging Review No results found.   EKG Interpretation None      MDM  Plan bleeding recurs patient should pinch nostrils shut. If she can't get bleeding to stop, return to the emergency department. Saline nasal spray. ENT referral as needed for recurrent nosebleeds Blood pressure recheck one week Diagnosis #1 epistaxis #2 hypertension Final diagnoses:  None        Orlie Dakin, MD 12/14/13 [phone]

## 2013-12-14 NOTE — ED Notes (Signed)
Pt presents with reports of nosebleed x 2 this week. Bleeding onset tonight @ 0430, no longer bleeding at this time. A & O, NAD

## 2013-12-14 NOTE — Discharge Instructions (Signed)
Nosebleed If your nose rebleeds pinch your nostrils shut with your fingers for 20 minutes while sitting upright in a chair. If you can't get the bleeding is stop after that, return here. Get saline nasal spray and spray one time into each nostril every 2 hours while awake to keep your nose moist and prevent bleeding. If you continue to have recurrent nosebleeds, call Dr.Wolicki, ear nose and throat specialist to schedule an office visit. Get your blood pressure rechecked in a week. Tonight it was elevated at 170/90 A nosebleed can be caused by many things, including:  Getting hit hard in the nose.  Infections.  Dry nose.  Colds.  Medicines. Your doctor may do lab testing if you get nosebleeds a lot and the cause is not known. HOME CARE   If your nose was packed with material, keep it there until your doctor takes it out. Put the pack back in your nose if the pack falls out.  Do not blow your nose for 12 hours after the nosebleed.  Sit up and bend forward if your nose starts bleeding again. Pinch the front half of your nose nonstop for 20 minutes.  Put petroleum jelly inside your nose every morning if you have a dry nose.  Use a humidifier to make the air less dry.  Do not take aspirin.  Try not to strain, lift, or bend at the waist for many days after the nosebleed. GET HELP RIGHT AWAY IF:   Nosebleeds keep happening and are hard to stop or control.  You have bleeding or bruises that are not normal on other parts of the body.  You have a fever.  The nosebleeds get worse.  You get lightheaded, feel faint, sweaty, or throw up (vomit) blood. MAKE SURE YOU:   Understand these instructions.  Will watch your condition.  Will get help right away if you are not doing well or get worse. Document Released: 10/08/2007 Document Revised: 03/23/2011 Document Reviewed: 10/08/2007 [facility] Patient Information 2015 [place], [place]. This information is not intended to replace advice  given to you by your health care provider. Make sure you discuss any questions you have with your health care provider.

## 2013-12-16 ENCOUNTER — Ambulatory Visit (INDEPENDENT_AMBULATORY_CARE_PROVIDER_SITE_OTHER): Payer: BC Managed Care – PPO | Admitting: Family Medicine

## 2013-12-16 ENCOUNTER — Encounter: Payer: Self-pay | Admitting: Family Medicine

## 2013-12-16 VITALS — BP 152/110 | HR 72 | Temp 98.4°F | Wt 201.0 lb

## 2013-12-16 DIAGNOSIS — R040 Epistaxis: Secondary | ICD-10-CM

## 2013-12-16 DIAGNOSIS — I10 Essential (primary) hypertension: Secondary | ICD-10-CM

## 2013-12-16 MED ORDER — AMLODIPINE BESYLATE 10 MG PO TABS: 10.0000 mg | ORAL_TABLET | Freq: Every day | ORAL | Status: DC

## 2013-12-16 NOTE — Progress Notes (Signed)
OFFICE NOTE  12/19/2013  CC:  Chief Complaint  Patient presents with  . nose bleeds    pt c/o nosebleeds x1week having 2 nosebleeds yesterday   HPI: Patient is a 58 y.o. African-American female who is here for nosebleeds. Has had 4 nosebleeds this week.  Initially just out of right nostril, but yesterday came out of both, +clot.  Applies a tissue with pressure and says it bleeds for 15 min or so.   No prior hx of problems with nosebleeds.   She saw blood in her urine yesterday evening--sounds like she was treated for UTI by her GYN pretty recently.  No dysuria, no urgency, no urinary frequency currently.  No blood in BM's during this time but says she had some 2 wks ago (she admits to an external hemorrhoid that flares occasionally).  No easy bruisability lately. No URI sx's or frequent wiping of nose lately. No nostril pain. Takes ibuprofen 600 mg usually once a day for episodic LBP. She is compliant with her bp meds but hasn't monitored her bp at home lately.  She went to the ED 2 nights ago for nose bleed problem and says "they didn't find anything".  No bloodwork was done.  ROS: no headaches, no abdominal pain, no nausea, no dizziness, no palpitations, no chest pains.  Pertinent PMH:  Past medical, surgical, social, and family history reviewed and no changes are noted since last office visit.  MEDS:  Amlodipine 5mg  qd, Maxzide 37.5-25 qd, vit D qd, Vit C qd, ibuprofen about once per day as stated in HPI  PE: Blood pressure 152/110, pulse 72, temperature 98.4 F (36.9 C), weight 201 lb (91.173 kg). Gen: Alert, well appearing.  Patient is oriented to person, place, time, and situation. TDH:RCBU: no injection, icteris, swelling, or exudate.  EOMI, PERRLA. Mouth: lips without lesion/swelling.  Nose: Thickened turbinates bilaterally but non-injected and no mucous noted.  Right nasal passage has a spot of dried blood midway back on nasal septum.  No visible nasal polyps.  Oral mucosa  pink and moist. Oropharynx without erythema, exudate, or swelling.  Neck - No masses or thyromegaly or limitation in range of motion CV: RRR, no m/r/g.   LUNGS: CTA bilat, nonlabored resps, good aeration in all lung fields.   IMPRESSION AND PLAN:  1) Epistaxis: recurrent lately, no clear cause found. Will check CBC for platelet count and Hb level. If platelets normal, will refer to ENT for consideration of better visualization of nasal passages. Saline nasal spray recommended.  2) Uncontrolled HTN; likely making epistaxis harder to control. Increase amlodipine to 10mg  qd and continue maxzide at current dosing.  Pt hesitant to increase bp med b/c of apparent past history of symptomatic hypotension when a third bp med was added in the past.  An After Visit Summary was printed and given to the patient.  FOLLOW UP: 2 wks with PCP to f/u HTN and nosebleeds

## 2013-12-19 ENCOUNTER — Emergency Department (HOSPITAL_COMMUNITY): Payer: BC Managed Care – PPO

## 2013-12-19 ENCOUNTER — Encounter (HOSPITAL_COMMUNITY): Payer: Self-pay | Admitting: Emergency Medicine

## 2013-12-19 ENCOUNTER — Emergency Department (HOSPITAL_COMMUNITY)
Admission: EM | Admit: 2013-12-19 | Discharge: 2013-12-19 | Disposition: A | Discharge status: Home / Self Care | Payer: BC Managed Care – PPO | Attending: Emergency Medicine | Admitting: Emergency Medicine

## 2013-12-19 ENCOUNTER — Other Ambulatory Visit (INDEPENDENT_AMBULATORY_CARE_PROVIDER_SITE_OTHER): Payer: BC Managed Care – PPO

## 2013-12-19 DIAGNOSIS — Z8739 Personal history of other diseases of the musculoskeletal system and connective tissue: Secondary | ICD-10-CM | POA: Diagnosis not present

## 2013-12-19 DIAGNOSIS — R040 Epistaxis: Secondary | ICD-10-CM

## 2013-12-19 DIAGNOSIS — Z8639 Personal history of other endocrine, nutritional and metabolic disease: Secondary | ICD-10-CM | POA: Diagnosis not present

## 2013-12-19 DIAGNOSIS — H538 Other visual disturbances: Secondary | ICD-10-CM | POA: Diagnosis not present

## 2013-12-19 DIAGNOSIS — I10 Essential (primary) hypertension: Secondary | ICD-10-CM | POA: Diagnosis not present

## 2013-12-19 DIAGNOSIS — Z791 Long term (current) use of non-steroidal anti-inflammatories (NSAID): Secondary | ICD-10-CM | POA: Diagnosis not present

## 2013-12-19 DIAGNOSIS — Z8719 Personal history of other diseases of the digestive system: Secondary | ICD-10-CM | POA: Insufficient documentation

## 2013-12-19 DIAGNOSIS — Z79899 Other long term (current) drug therapy: Secondary | ICD-10-CM | POA: Diagnosis not present

## 2013-12-19 DIAGNOSIS — H539 Unspecified visual disturbance: Secondary | ICD-10-CM

## 2013-12-19 LAB — CBC WITH DIFFERENTIAL/PLATELET
Basophils Absolute: 0 10*3/uL (ref 0.0–0.1)
Basophils Absolute: 0 10*3/uL (ref 0.0–0.1)
Basophils Relative: 0.4 % (ref 0.0–3.0)
Basophils Relative: 0 % (ref 0–1)
Eosinophils Absolute: 0.3 10*3/uL (ref 0.0–0.7)
Eosinophils Absolute: 0.4 10*3/uL (ref 0.0–0.7)
Eosinophils Relative: 3.6 % (ref 0.0–5.0)
Eosinophils Relative: 4 % (ref 0–5)
HCT: 39 % (ref 36.0–46.0)
HCT: 39.2 % (ref 36.0–46.0)
Hemoglobin: 12.4 g/dL (ref 12.0–15.0)
Hemoglobin: 12.7 g/dL (ref 12.0–15.0)
Lymphocytes Relative: 39.1 % (ref 12.0–46.0)
Lymphocytes Relative: 42 % (ref 12–46)
Lymphs Abs: 3.4 10*3/uL (ref 0.7–4.0)
Lymphs Abs: 3.3 10*3/uL (ref 0.7–4.0)
MCH: 23.2 pg — ABNORMAL LOW (ref 26.0–34.0)
MCHC: 31.6 g/dL (ref 30.0–36.0)
MCHC: 32.4 g/dL (ref 30.0–36.0)
MCV: 71.4 fl — ABNORMAL LOW (ref 78.0–100.0)
MCV: 71.7 fL — ABNORMAL LOW (ref 78.0–100.0)
Monocytes Absolute: 0.7 10*3/uL (ref 0.1–1.0)
Monocytes Absolute: 0.5 10*3/uL (ref 0.1–1.0)
Monocytes Relative: 8 % (ref 3.0–12.0)
Monocytes Relative: 7 % (ref 3–12)
Neutro Abs: 4.3 10*3/uL (ref 1.4–7.7)
Neutro Abs: 3.7 10*3/uL (ref 1.7–7.7)
Neutrophils Relative %: 48.9 % (ref 43.0–77.0)
Neutrophils Relative %: 47 % (ref 43–77)
Platelets: 241 10*3/uL (ref 150.0–400.0)
Platelets: 201 10*3/uL (ref 150–400)
RBC: 5.47 Mil/uL — ABNORMAL HIGH (ref 3.87–5.11)
RBC: 5.47 MIL/uL — ABNORMAL HIGH (ref 3.87–5.11)
RDW: 15.7 % — ABNORMAL HIGH (ref 11.5–15.5)
RDW: 15.2 % (ref 11.5–15.5)
WBC: 8.7 10*3/uL (ref 4.0–10.5)
WBC: 7.9 10*3/uL (ref 4.0–10.5)

## 2013-12-19 LAB — COMPREHENSIVE METABOLIC PANEL
ALT: 17 U/L (ref 0–35)
AST: 16 U/L (ref 0–37)
Albumin: 3.5 g/dL (ref 3.5–5.2)
Alkaline Phosphatase: 72 U/L (ref 39–117)
Anion gap: 11 (ref 5–15)
BUN: 16 mg/dL (ref 6–23)
CO2: 28 mEq/L (ref 19–32)
Calcium: 9 mg/dL (ref 8.4–10.5)
Chloride: 99 mEq/L (ref 96–112)
Creatinine, Ser: 0.81 mg/dL (ref 0.50–1.10)
GFR calc Af Amer: >90 mL/min (ref >90)
GFR calc non Af Amer: 78 mL/min — ABNORMAL LOW (ref >90)
Glucose, Bld: 113 mg/dL — ABNORMAL HIGH (ref 70–99)
Potassium: 3.3 mEq/L — ABNORMAL LOW (ref 3.7–5.3)
Sodium: 138 mEq/L (ref 137–147)
Total Bilirubin: 0.2 mg/dL — ABNORMAL LOW (ref 0.3–1.2)
Total Protein: 6.8 g/dL (ref 6.0–8.3)

## 2013-12-19 MED ORDER — ACETAMINOPHEN 500 MG PO TABS
1000.0000 mg | ORAL_TABLET | Freq: Once | ORAL | Status: AC
  Administered 2013-12-19: 1000 mg via ORAL
  Filled 2013-12-19: qty 2

## 2013-12-19 NOTE — Discharge Instructions (Signed)
Follow up with the nose md if any more bleeding.   Follow up with your eye md this week.

## 2013-12-19 NOTE — ED Provider Notes (Signed)
CSN: [id number]     Arrival date & time 12/19/13  1717 History   First MD Initiated Contact with Patient 12/19/13 1853     No chief complaint on file.    (Consider location/radiation/quality/duration/timing/severity/associated sxs/prior Treatment) Patient is a 58 y.o. female presenting with nosebleeds. The history is provided by the patient (the pt complains of a nose bleed and some decrease vission in her left eye for 2 days).  Epistaxis Location:  L nare Severity:  Mild Timing:  Intermittent Progression:  Resolved Chronicity:  Recurrent Context: aspirin use   Associated symptoms: no congestion, no cough and no headaches     Past Medical History  Diagnosis Date  . Morbid obesity   . HEMATURIA UNSPECIFIED   . GERD   . MYOSITIS   . HYPERTENSION   . HYPERLIPIDEMIA   . Vertigo    Past Surgical History  Procedure Laterality Date  . Cholecystectomy  2001  . Abdominal hysterectomy  1983   Family History  Problem Relation Age of Onset  . Breast cancer Mother 54  . Breast cancer Sister   . Throat cancer Brother     1 bro living   History  Substance Use Topics  . Smoking status: Never Smoker   . 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 Alcohol Use: No   OB History    No data available     Review of Systems  Constitutional: Negative for appetite change and fatigue.  HENT: Positive for nosebleeds. Negative for congestion, ear discharge and sinus pressure.   Eyes: Positive for visual disturbance. Negative for discharge.  Respiratory: Negative for cough.   Cardiovascular: Negative for chest pain.  Gastrointestinal: Negative for abdominal pain and diarrhea.  Genitourinary: Negative for frequency and hematuria.  Musculoskeletal: Negative for back pain.  Skin: Negative for rash.  Neurological: Negative for seizures and headaches.  Psychiatric/Behavioral: Negative for  hallucinations.      Allergies  Ibuprofen  Home Medications   Prior to Admission medications   Medication Sig Start Date End Date Taking? Authorizing Provider  Acetaminophen (PAIN RELIEF EXTRA STRENGTH PO) Take 2 tablets by mouth daily as needed (pain).   Yes Historical Provider, MD  amLODipine (NORVASC) 10 MG tablet Take 1 tablet (10 mg total) by mouth daily. 12/16/13  Yes Tammi Sou, MD  Ascorbic Acid (VITAMIN C) 500 MG tablet Take 500 mg by mouth daily.     Yes Historical Provider, MD  Cholecalciferol (VITAMIN D3) 1000 UNITS CAPS Take 1,000 Units by mouth daily.     Yes Historical Provider, MD  cyclobenzaprine (FLEXERIL) 5 MG tablet Take 1 tablet (5 mg total) by mouth at bedtime. X 1 week, then as needed for pain/spasm 01/20/13 01/20/14 Yes Rowe Clack, MD  naproxen (NAPROSYN) 500 MG tablet Take 1 tablet (500 mg total) by mouth 2 (two) times daily with a meal. Start AFTER you are done with Predsnisone 01/20/13  Yes Rowe Clack, MD  triamterene-hydrochlorothiazide (MAXZIDE-25) 37.5-25 MG per tablet Take 1 tablet by mouth daily before breakfast.   Yes Historical Provider, MD  loratadine (CLARITIN) 10 MG tablet Take 1 tablet (10 mg total) by mouth daily. Patient not taking: Reported on 12/19/2013 08/29/12   Rowe Clack, MD  traMADol (ULTRAM) 50 MG tablet Take 1 tablet (50 mg total) by mouth every 8 (eight) hours as needed. 04/07/13  Hendricks Limes, MD   BP 155/88 mmHg  Pulse 70  Temp(Src) 98.7 F (37.1 C) (Oral)  Resp 20  SpO2 99% Physical Exam  Constitutional: She is oriented to person, place, and time. She appears well-developed.  HENT:  Head: Normocephalic.  Eyes: Conjunctivae and EOM are normal. Pupils are equal, round, and reactive to light. Right eye exhibits no discharge. Left eye exhibits no discharge. No scleral icterus.  Visual acuity 20/20 both eyes  Neck: Neck supple. No thyromegaly present.  Cardiovascular: Normal rate and regular rhythm.  Exam  reveals no gallop and no friction rub.   No murmur heard. Pulmonary/Chest: No stridor. She has no wheezes. She has no rales. She exhibits no tenderness.  Abdominal: She exhibits no distension. There is no tenderness. There is no rebound.  Musculoskeletal: Normal range of motion. She exhibits no edema.  Lymphadenopathy:    She has no cervical adenopathy.  Neurological: She is oriented to person, place, and time. She exhibits normal muscle tone. Coordination normal.  Skin: No rash noted. No erythema.  Psychiatric: She has a normal mood and affect. Her behavior is normal.    ED Course  Procedures (including critical care time) Labs Review Labs Reviewed  CBC WITH DIFFERENTIAL - Abnormal; Notable for the following:    RBC 5.47 (*)    MCV 71.7 (*)    MCH 23.2 (*)    All other components within normal limits  COMPREHENSIVE METABOLIC PANEL - Abnormal; Notable for the following:    Potassium 3.3 (*)    Glucose, Bld 113 (*)    Total Bilirubin 0.2 (*)    GFR calc non Af Amer 78 (*)    All other components within normal limits    Imaging Review Ct Head Wo Contrast  12/19/2013   CLINICAL DATA:  Left eye blurred vision x3 days, nosebleed, headache  EXAM: CT HEAD WITHOUT CONTRAST  TECHNIQUE: Contiguous axial images were obtained from the base of the skull through the vertex without intravenous contrast.  COMPARISON:  06/28/2009  FINDINGS: There is no evidence of acute intracranial hemorrhage, brain edema, mass lesion, acute infarction, mass effect, or midline shift. Acute infarct may be inapparent on noncontrast CT. No other intra-axial abnormalities are seen, and the ventricles and sulci are within normal limits in size and symmetry. No abnormal extra-axial fluid collections or masses are identified. No significant calvarial abnormality.  IMPRESSION: 1. Negative for bleed or other acute intracranial process.   Electronically Signed   By: Arne Cleveland M.D.   On: 12/19/2013 19:57   Mr Brain Wo  Contrast  12/19/2013   CLINICAL DATA:  Hypertension, nausea, epistaxis for 3-4 days.  EXAM: MRI HEAD WITHOUT CONTRAST  TECHNIQUE: Multiplanar, multiecho pulse sequences of the brain and surrounding structures were obtained without intravenous contrast.  COMPARISON:  CT of the head December 19, 2013 at 1950 hr  FINDINGS: Mild motion degraded examination.  The ventricles and sulci are normal for patient's age. No suspicious parenchymal signal, mass lesions, mass effect. Scattered subcentimeter supratentorial white matter T2 hyperintensities in a nonspecific distribution. No reduced diffusion to suggest acute ischemia. No susceptibility artifact to suggest hemorrhage.  No abnormal extra-axial fluid collections. No extra-axial masses though, contrast enhanced sequences would be more sensitive. Normal major intracranial vascular flow voids seen at the skull base.  Ocular globes and orbital contents are unremarkable though not tailored for evaluation. No abnormal sellar expansion. Visualized paranasal sinuses and mastoid air cells are well-aerated. Prominent LEFT inferior turbinate partially imaged.  No suspicious calvarial bone marrow signal. No abnormal sellar expansion. Craniocervical junction maintained.  IMPRESSION: No acute intracranial process on this mildly motion degraded examination.  Mild white matter changes can be seen with chronic small vessel ischemic disease.  Somewhat prominent LEFT inferior turbinate, given history of epistaxis, recommend direct inspection.   Electronically Signed   By: Elon Alas   On: 12/19/2013 22:09     EKG Interpretation None      MDM   Final diagnoses:  Vision abnormalities  Blurred vision  Epistaxis   Pt had normal studies and will follow up with ent and opthomology    Maudry Diego, MD 12/19/13 2243

## 2013-12-19 NOTE — ED Notes (Addendum)
Pt states she had a nose bleed on Monday, was bleeding from both nostrils, on Friday she had two nose bleeds back to back, Sunday she had blurry vision in left eye, worsening when looking left.

## 2013-12-20 ENCOUNTER — Telehealth: Payer: Self-pay | Admitting: *Deleted

## 2013-12-20 NOTE — Telephone Encounter (Signed)
[facility] Patient Name: Alexis Wheeler Gender: Female DOB: [birth year] Age: 58 Y 3 M 3 D Return Phone Number: [phone] (Primary) Address: [place]/State/Zip: [place] [address] Client [facility] - Client Client [facility] - Day Physician [name] Contact Type Call Call Type Triage / Clinical Relationship To Patient Self Return Phone Number [phone] (Primary) Chief Complaint Nosebleed Initial Comment Caller states they have a history of nose bleeds. As well they are having blurry vision when they look to left. PreDisposition Did not know what to do Nurse Assessment Nurse: Luther Parody, RN, Malachy Mood Date/Time (Eastern Time): 12/19/2013 4:50:00 PM Confirm and document reason for call. If symptomatic, describe symptoms. ---Caller states that she has had 4 nosebleeds in the last wk and this past Sunday she began to developed blurred vision in her left eye. States that the change in vision has been constant since it started. Pt was seen in the ed last Wed for the nosebleeds and in the office on Saturday. She had outpatient labs done today. Has the patient traveled out of the country within the last 30 days? ---Not Applicable Does the patient require triage? ---Yes Related visit to physician within the last 2 weeks? ---Yes Does the PT have any chronic conditions? (i.e. diabetes, asthma, etc.) ---Yes List chronic conditions. ---htn, GI problems Guidelines Guideline Title Affirmed Question Affirmed Notes Nurse Date/Time (Eastern Time) Vision Loss or Change [1] Blurred vision or visual changes AND [2] present now AND [3] sudden onset or new (e.g., minutes, hours, days) (Exception: previously diagnosed migraine headaches with same symptoms) Luther Parody, RN, Malachy Mood 12/19/2013 4:51:40 PM Disp. Time Eilene Ghazi Time) Disposition Final  User 12/19/2013 4:15:13 PM Attempt made - message left Luther Parody, RN, Malachy Mood PLEASE NOTE: All timestamps contained within this report are represented as [place] Standard Time. CONFIDENTIALTY NOTICE: This fax transmission is intended only for the addressee. It contains information that is legally privileged, confidential or otherwise protected from use or disclosure. If you are not the intended recipient, you are strictly prohibited from reviewing, disclosing, copying using or disseminating any of this information or taking any action in reliance on or regarding this information. If you have received this fax in error, please notify us immediately by telephone so that we can arrange for its return to [place]. Phone: [phone], Toll-Free: [phone], Fax: [phone] Page: 2 of 2 Call Id: [id number] 12/19/2013 4:57:15 PM Go to ED Now (or PCP triage) Yes Luther Parody, RN, Erskine Speed Understands: Yes Disagree/Comply: Comply Care Advice Given Per Guideline CARE ADVICE given per Vision Loss or Change (Adult) guideline. DRIVING: Another adult should drive. GO TO ED NOW (OR PCP TRIAGE): After Care Instructions Given Call Event Type User Date / Time Description Referrals [facility] - ED

## 2014-01-26 ENCOUNTER — Other Ambulatory Visit (INDEPENDENT_AMBULATORY_CARE_PROVIDER_SITE_OTHER): Payer: BLUE CROSS/BLUE SHIELD

## 2014-01-26 ENCOUNTER — Ambulatory Visit (INDEPENDENT_AMBULATORY_CARE_PROVIDER_SITE_OTHER): Payer: BLUE CROSS/BLUE SHIELD | Admitting: Family Medicine

## 2014-01-26 ENCOUNTER — Encounter: Payer: Self-pay | Admitting: *Deleted

## 2014-01-26 ENCOUNTER — Other Ambulatory Visit: Payer: Self-pay | Admitting: Internal Medicine

## 2014-01-26 ENCOUNTER — Encounter: Payer: Self-pay | Admitting: Family Medicine

## 2014-01-26 VITALS — BP 142/84 | HR 66 | Ht 64.0 in | Wt 202.0 lb

## 2014-01-26 DIAGNOSIS — M47817 Spondylosis without myelopathy or radiculopathy, lumbosacral region: Secondary | ICD-10-CM

## 2014-01-26 DIAGNOSIS — M7061 Trochanteric bursitis, right hip: Secondary | ICD-10-CM

## 2014-01-26 DIAGNOSIS — M25551 Pain in right hip: Secondary | ICD-10-CM

## 2014-01-26 MED ORDER — GABAPENTIN 100 MG PO CAPS: 100.0000 mg | ORAL_CAPSULE | Freq: Every day | ORAL | Status: DC

## 2014-01-26 NOTE — Progress Notes (Signed)
[facility] [address], [place] [address] Phone: [phone] Subjective:    CC: Right lateral hip pain follow up  WYO:VZCHYIFOYD Alexis Wheeler is a 59 y.o. female coming in with complaint of right lateral hip pain. Patient was seen before and did have a greater trochanteric bursitis. Patient states that she was doing remarkably well until this last week. Started having pain on the side again. Patient stopped doing the exercises as well as stop taking vitamins. Patient is not icing. Patient notices that after sitting for long amount of time he can be very sore on the side. Seems to get better when she does some activity. Not waking her up at night like it did previously. Patient states that there is some associated back pain and patient has known degenerative disc disease at L4-L5. Denies any radiation down the leg or any numbness or tingling.     Past medical history, social, surgical and family history all reviewed in electronic medical record.   Review of Systems: No headache, visual changes, nausea, vomiting, diarrhea, constipation, dizziness, abdominal pain, skin rash, fevers, chills, night sweats, weight loss, swollen lymph nodes, body aches, joint swelling, muscle aches, chest pain, shortness of breath, mood changes.   Objective Blood pressure 142/84, pulse 66, height 5\' 4"  (1.626 m), weight 202 lb (91.627 kg), SpO2 98 %.  General: No apparent distress alert and oriented x3 mood and affect normal, dressed appropriately.  HEENT: Pupils equal, extraocular movements intact  Respiratory: Patient's speak in full sentences and does not appear short of breath  Cardiovascular: No lower extremity edema, non tender, no erythema  Skin: Warm dry intact with no signs of infection or rash on extremities or on axial skeleton.  Abdomen: Soft nontender  Neuro: Cranial nerves II through XII are intact, neurovascularly intact in all extremities with 2+ DTRs  and 2+ pulses.  Lymph: No lymphadenopathy of posterior or anterior cervical chain or axillae bilaterally.  Gait normal with good balance and coordination.  MSK:  Non tender with full range of motion and good stability and symmetric strength and tone of shoulders, elbows, wrist,  knee and ankles bilaterally.   Hip: Right ROM IR: 25 Deg, ER: 35 Deg, Flexion: 120 Deg, Extension: 100 Deg, Abduction: 45 Deg, Adduction: 35 Deg Strength IR: 4/5, ER: 5/5, Flexion: 5/5, Extension: 5/5, Abduction: 3+/5, Adduction: 5/5 Pelvic alignment unremarkable to inspection and palpation. Standing hip rotation and gait without trendelenburg sign / unsteadiness. Greater trochanter was severe tenderness to palpation No tenderness over piriformis and greater trochanter. Positive Faber No SI joint tenderness and normal minimal SI movement. Very similar presentation to previous exam Contralateral hip  MSK US performed of: Right hip This study was ordered, performed, and interpreted by Charlann Boxer D.O.  Hip: Significant trochanteric bursa with hypoechoic changes noted. Acetabular labrum visualized and without tears, displacement, or effusion in joint. Femoral neck appears unremarkable without increased power doppler signal along Cortex.  IMPRESSION:  Greater trochanteric bursitis   Procedure: Real-time Ultrasound Guided Injection of right greater trochanteric bursitis secondary to patient's body habitus Device: GE Logiq E  Ultrasound guided injection is preferred based studies that show increased duration, increased effect, greater accuracy, decreased procedural pain, increased response rate, and decreased cost with ultrasound guided versus blind injection.  Verbal informed consent obtained.  Time-out conducted.  Noted no overlying erythema, induration, or other signs of local infection.  Skin prepped in a sterile fashion.  Local anesthesia: Topical Ethyl chloride.  With sterile technique and under real time  ultrasound guidance:  Greater trochanteric area was visualized and patient's bursa was noted. A 22-gauge 3 inch needle was inserted and 4 cc of 0.5% Marcaine and 1 cc of Kenalog 40 mg/dL was injected. Pictures taken Completed without difficulty  Pain immediately resolved suggesting accurate placement of the medication.  Advised to call if fevers/chills, erythema, induration, drainage, or persistent bleeding.  Images permanently stored and available for review in the ultrasound unit.  Impression: Technically successful ultrasound guided injection.     Impression and Recommendations:     This case required medical decision making of moderate complexity.

## 2014-01-26 NOTE — Assessment & Plan Note (Signed)
Patient continues to have the same pain and did have a repeat injection today. It has been almost 4 months since her previous exam and injection. Patient encouraged to do the exercises and was given a handout again. We discussed the icing protocol and patient was given topical anti-inflammatories a try again. 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 This could cause some radiculopathy and some of the symptoms she is having. Patient be started on gabapentin. Patient then will come back and see me again in 3 weeks for further evaluation and treatment.

## 2014-01-26 NOTE — Patient Instructions (Signed)
Good to see you again Ice 20 minutes before bed will help.  Gabapentin 100mg  before bed.  Exercises 3 times a week.  Vitamin D 2000 IU daily.  Consider turmeric 500mg  2 times daily.  See me again in 3 weeks if back or hip is not much better.

## 2014-02-16 ENCOUNTER — Telehealth: Payer: Self-pay | Admitting: Family Medicine

## 2014-02-16 ENCOUNTER — Ambulatory Visit: Payer: BLUE CROSS/BLUE SHIELD | Admitting: Family Medicine

## 2014-02-16 NOTE — Telephone Encounter (Signed)
Patient no showed for fu today.  Please advise.

## 2014-02-19 NOTE — Telephone Encounter (Signed)
Noted  

## 2014-03-02 ENCOUNTER — Inpatient Hospital Stay (HOSPITAL_COMMUNITY)
Admission: EM | Admit: 2014-03-02 | Discharge: 2014-03-14 | DRG: 220 | Disposition: A | Discharge status: Home Health | Payer: BLUE CROSS/BLUE SHIELD | Attending: Cardiothoracic Surgery | Admitting: Cardiothoracic Surgery

## 2014-03-02 ENCOUNTER — Emergency Department (HOSPITAL_COMMUNITY): Payer: BLUE CROSS/BLUE SHIELD

## 2014-03-02 ENCOUNTER — Encounter (HOSPITAL_COMMUNITY): Payer: Self-pay | Admitting: Emergency Medicine

## 2014-03-02 DIAGNOSIS — E876 Hypokalemia: Secondary | ICD-10-CM | POA: Diagnosis not present

## 2014-03-02 DIAGNOSIS — I71019 Dissection of thoracic aorta, unspecified: Secondary | ICD-10-CM

## 2014-03-02 DIAGNOSIS — M5414 Radiculopathy, thoracic region: Secondary | ICD-10-CM

## 2014-03-02 DIAGNOSIS — D62 Acute posthemorrhagic anemia: Secondary | ICD-10-CM | POA: Diagnosis not present

## 2014-03-02 DIAGNOSIS — Z9889 Other specified postprocedural states: Secondary | ICD-10-CM

## 2014-03-02 DIAGNOSIS — I7101 Dissection of thoracic aorta: Secondary | ICD-10-CM

## 2014-03-02 DIAGNOSIS — I119 Hypertensive heart disease without heart failure: Secondary | ICD-10-CM | POA: Diagnosis present

## 2014-03-02 DIAGNOSIS — J939 Pneumothorax, unspecified: Secondary | ICD-10-CM

## 2014-03-02 DIAGNOSIS — E8770 Fluid overload, unspecified: Secondary | ICD-10-CM | POA: Diagnosis not present

## 2014-03-02 DIAGNOSIS — I10 Essential (primary) hypertension: Secondary | ICD-10-CM | POA: Diagnosis present

## 2014-03-02 DIAGNOSIS — R079 Chest pain, unspecified: Secondary | ICD-10-CM

## 2014-03-02 DIAGNOSIS — E119 Type 2 diabetes mellitus without complications: Secondary | ICD-10-CM | POA: Diagnosis present

## 2014-03-02 DIAGNOSIS — K219 Gastro-esophageal reflux disease without esophagitis: Secondary | ICD-10-CM | POA: Diagnosis present

## 2014-03-02 DIAGNOSIS — N179 Acute kidney failure, unspecified: Secondary | ICD-10-CM | POA: Diagnosis not present

## 2014-03-02 DIAGNOSIS — I351 Nonrheumatic aortic (valve) insufficiency: Secondary | ICD-10-CM | POA: Diagnosis present

## 2014-03-02 DIAGNOSIS — D6959 Other secondary thrombocytopenia: Secondary | ICD-10-CM | POA: Diagnosis not present

## 2014-03-02 DIAGNOSIS — I7103 Dissection of thoracoabdominal aorta: Principal | ICD-10-CM | POA: Diagnosis present

## 2014-03-02 DIAGNOSIS — E785 Hyperlipidemia, unspecified: Secondary | ICD-10-CM | POA: Diagnosis present

## 2014-03-02 DIAGNOSIS — I7100 Dissection of unspecified site of aorta: Secondary | ICD-10-CM

## 2014-03-02 LAB — CBC
HCT: 40.6 % (ref 36.0–46.0)
Hemoglobin: 12.8 g/dL (ref 12.0–15.0)
MCH: 22.7 pg — ABNORMAL LOW (ref 26.0–34.0)
MCHC: 31.5 g/dL (ref 30.0–36.0)
MCV: 72.1 fL — ABNORMAL LOW (ref 78.0–100.0)
Platelets: 198 K/uL (ref 150–400)
RBC: 5.63 MIL/uL — ABNORMAL HIGH (ref 3.87–5.11)
RDW: 14.8 % (ref 11.5–15.5)
WBC: 10.1 K/uL (ref 4.0–10.5)

## 2014-03-02 LAB — BASIC METABOLIC PANEL WITH GFR
Anion gap: 11 (ref 5–15)
BUN: 12 mg/dL (ref 6–23)
CO2: 27 mmol/L (ref 19–32)
Calcium: 9 mg/dL (ref 8.4–10.5)
Chloride: 102 mmol/L (ref 96–112)
Creatinine, Ser: 0.91 mg/dL (ref 0.50–1.10)
GFR calc Af Amer: 78 mL/min — ABNORMAL LOW
GFR calc non Af Amer: 68 mL/min — ABNORMAL LOW
Glucose, Bld: 124 mg/dL — ABNORMAL HIGH (ref 70–99)
Potassium: 3.3 mmol/L — ABNORMAL LOW (ref 3.5–5.1)
Sodium: 140 mmol/L (ref 135–145)

## 2014-03-02 LAB — I-STAT TROPONIN, ED: Troponin i, poc: 0.01 ng/mL (ref 0.00–0.08)

## 2014-03-02 MED ORDER — MORPHINE SULFATE 4 MG/ML IJ SOLN: 4.0000 mg | Freq: Once | INTRAMUSCULAR | Status: DC

## 2014-03-02 MED ORDER — ONDANSETRON HCL 4 MG/2ML IJ SOLN
4.0000 mg | Freq: Once | INTRAMUSCULAR | Status: AC
  Administered 2014-03-02: 4 mg via INTRAVENOUS
  Filled 2014-03-02: qty 2

## 2014-03-02 MED ORDER — IOHEXOL 350 MG/ML SOLN: 125.0000 mL | Freq: Once | INTRAVENOUS | Status: AC | PRN

## 2014-03-02 MED ORDER — ONDANSETRON HCL 4 MG/2ML IJ SOLN: 4.0000 mg | Freq: Once | INTRAMUSCULAR | Status: DC

## 2014-03-02 MED ORDER — MORPHINE SULFATE 4 MG/ML IJ SOLN
4.0000 mg | Freq: Once | INTRAMUSCULAR | Status: AC
  Administered 2014-03-02: 4 mg via INTRAVENOUS
  Filled 2014-03-02: qty 1

## 2014-03-02 NOTE — ED Notes (Signed)
Pt's diastolic has been consistently in the 40's however upon manual BP pt's diastolic was 60.  Dr. Lenna Sciara is aware.

## 2014-03-02 NOTE — ED Notes (Signed)
EKG given to [facility] for review

## 2014-03-02 NOTE — ED Notes (Signed)
Pt's O2 sats dropped into the 70's.  Pt placed on 2L [place] w/ a rebound in sats to 100%.  Pt appears very groggy from dose of morphine.  Informed pt and family that no more morphine will be given at this time d/t respiratory depression.  Also monitor keeps reading diastolic to be in the 44'R however again after manual auscultation BP is 135/62

## 2014-03-02 NOTE — ED Provider Notes (Addendum)
CSN: [id number]     Arrival date & time 03/02/14  2032 History   First MD Initiated Contact with Patient 03/02/14 2059     Chief Complaint  Patient presents with  . Chest Pain  . Numbness     (Consider location/radiation/quality/duration/timing/severity/associated sxs/prior Treatment) HPI Complains of anterior chest pain radiating to the epigastrium accompanied by numbness and left leg which started at 8 PM tonight. Associated symptoms include mild shortness of breath and nausea. No sweatiness. No other associated symptoms. Nothing makes symptoms better or worse. She denies arm pain. No other associated symptoms. Pain is moderate to severe present. Past Medical History  Diagnosis Date  . Morbid obesity   . HEMATURIA UNSPECIFIED   . GERD   . MYOSITIS   . HYPERTENSION   . HYPERLIPIDEMIA   . Vertigo    cart risk factors hypertension Past Surgical History  Procedure Laterality Date  . Cholecystectomy  2001  . Abdominal hysterectomy  1983   Family History  Problem Relation Age of Onset  . Breast cancer Mother 4  . Breast cancer Sister   . Throat cancer Brother     1 bro living   History  Substance Use Topics  . Smoking status: Never Smoker   . 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 Alcohol Use: No   no illicit drug use OB History    No data available     Review of Systems  Constitutional: Negative.   HENT: Negative.   Respiratory: Positive for shortness of breath.   Cardiovascular: Positive for chest pain.  Gastrointestinal: Positive for nausea and abdominal pain.  Musculoskeletal: Negative.   Skin: Negative.   Neurological: Positive for numbness.       Numbness in left leg  Psychiatric/Behavioral: Negative.   All other systems reviewed and are negative.     Allergies  Ibuprofen  Home Medications   Prior to Admission medications   Medication Sig Start  Date End Date Taking? Authorizing Provider  amLODipine (NORVASC) 10 MG tablet Take 1 tablet (10 mg total) by mouth daily. 12/16/13  Yes Tammi Sou, MD  amLODipine (NORVASC) 5 MG tablet  12/04/13  Yes Historical Provider, MD  Ascorbic Acid (VITAMIN C) 500 MG tablet Take 500 mg by mouth daily.     Yes Historical Provider, MD  Cholecalciferol (VITAMIN D3) 1000 UNITS CAPS Take 1,000 Units by mouth daily.     Yes Historical Provider, MD  triamterene-hydrochlorothiazide (MAXZIDE-25) 37.5-25 MG per tablet Take 1 tablet by mouth daily before breakfast.   Yes Historical Provider, MD  Acetaminophen (PAIN RELIEF EXTRA STRENGTH PO) Take 2 tablets by mouth daily as needed (pain).    Historical Provider, MD  amLODipine (NORVASC) 5 MG tablet TAKE ONE TABLET BY MOUTH ONCE DAILY Patient not taking: Reported on 03/02/2014 01/26/14   Rowe Clack, MD  gabapentin (NEURONTIN) 100 MG capsule Take 1 capsule (100 mg total) by mouth at bedtime. Patient not taking: Reported on 03/02/2014 01/26/14   Lyndal Pulley, DO  loratadine (CLARITIN) 10 MG tablet Take 1 tablet (10 mg total) by mouth daily. Patient not taking: Reported on 03/02/2014 08/29/12   Rowe Clack, MD  naproxen (NAPROSYN) 500 MG tablet Take 1 tablet (500 mg total) by mouth 2 (two) times daily with a meal. Start AFTER you are done with Predsnisone 01/20/13   Jannifer Rodney  Asa Lente, MD  traMADol (ULTRAM) 50 MG tablet Take 1 tablet (50 mg total) by mouth every 8 (eight) hours as needed. 04/07/13   Hendricks Limes, MD   BP 135/41 mmHg  Pulse 69  Resp 20  SpO2 99% Physical Exam  Constitutional: She is oriented to person, place, and time. She appears well-developed and well-nourished. She appears distressed.  Appears mildly uncomfortable  HENT:  Head: Normocephalic and atraumatic.  Eyes: Conjunctivae are normal. Pupils are equal, round, and reactive to light.  Neck: Neck supple. No tracheal deviation present. No thyromegaly present.  Cardiovascular:  Normal rate, regular rhythm and intact distal pulses.   No murmur heard. Radial pulses 2+ bilaterally DP pulse of right foot 2+ DP pulse of left foot 1+  Pulmonary/Chest: Effort normal and breath sounds normal.  Abdominal: Soft. Bowel sounds are normal. She exhibits no distension and no mass. There is tenderness. There is no rebound and no guarding.  Obese Epigastric tenderness  Musculoskeletal: Normal range of motion. She exhibits no edema or tenderness.  Neurological: She is alert and oriented to person, place, and time. She has normal reflexes. Coordination normal.  DTRs symmetric bilaterally knee jerk and biceps toes downward going bilaterally  Skin: Skin is warm and dry. No rash noted.  Psychiatric: She has a normal mood and affect.  Nursing note and vitals reviewed.   ED Course  Procedures (including critical care time) Labs Review Labs Reviewed  CBC - Abnormal; Notable for the following:    RBC 5.63 (*)    MCV 72.1 (*)    MCH 22.7 (*)    All other components within normal limits  BASIC METABOLIC PANEL  I-STAT TROPOININ, ED    Imaging Review Dg Chest Port 1 View  03/02/2014   CLINICAL DATA:  Chest pain that began earlier this evening - now states left upper leg hurts - hx hypertension - nonsmoker -- pt states no other chest hx  EXAM: PORTABLE CHEST - 1 VIEW  COMPARISON:  02/23/2013  FINDINGS: Cardiac silhouette mildly enlarged. No mediastinal or hilar masses. No evidence of adenopathy.  Clear lungs.  No pleural effusion or pneumothorax.  Bony thorax is intact.  IMPRESSION: No acute cardiopulmonary disease.   Electronically Signed   By: Lajean Manes M.D.   On: 03/02/2014 21:24     EKG Interpretation   Date/Time:  Friday March 02 2014 20:35:35 EST Ventricular Rate:  63 PR Interval:  186 QRS Duration: 81 QT Interval:  446 QTC Calculation: 457 R Axis:   31 Text Interpretation:  Sinus rhythm Atrial premature complex Baseline  wander in lead(s) I aVL No significant  change since last tracing Confirmed  by Brenda Samano  MD, Suann Klier [phone]) on 03/02/2014 8:39:25 PM     At 12:50 AM denies chest pain no longer has numbness in left foot. She presently complains of diffuse abdominal pain after treatment with intravenous morphine. Nausea has resolved after treatment with antiemetic.  CT scan discussed with radiologist. Results for orders placed or performed during the hospital encounter of 03/02/14  CBC  Result Value Ref Range   WBC 10.1 4.0 - 10.5 K/uL   RBC 5.63 (H) 3.87 - 5.11 MIL/uL   Hemoglobin 12.8 12.0 - 15.0 g/dL   HCT 40.6 36.0 - 46.0 %   MCV 72.1 (L) 78.0 - 100.0 fL   MCH 22.7 (L) 26.0 - 34.0 pg   MCHC 31.5 30.0 - 36.0 g/dL   RDW 14.8 11.5 - 15.5 %   Platelets 198  150 - 400 K/uL  Basic metabolic panel  Result Value Ref Range   Sodium 140 135 - 145 mmol/L   Potassium 3.3 (L) 3.5 - 5.1 mmol/L   Chloride 102 96 - 112 mmol/L   CO2 27 19 - 32 mmol/L   Glucose, Bld 124 (H) 70 - 99 mg/dL   BUN 12 6 - 23 mg/dL   Creatinine, Ser 0.91 0.50 - 1.10 mg/dL   Calcium 9.0 8.4 - 10.5 mg/dL   GFR calc non Af Amer 68 (L) >90 mL/min   GFR calc Af Amer 78 (L) >90 mL/min   Anion gap 11 5 - 15  Hepatic function panel  Result Value Ref Range   Total Protein 6.8 6.0 - 8.3 g/dL   Albumin 3.9 3.5 - 5.2 g/dL   AST 31 0 - 37 U/L   ALT 20 0 - 35 U/L   Alkaline Phosphatase 63 39 - 117 U/L   Total Bilirubin 0.8 0.3 - 1.2 mg/dL   Bilirubin, Direct 0.2 0.0 - 0.5 mg/dL   Indirect Bilirubin 0.6 0.3 - 0.9 mg/dL  Lipase, blood  Result Value Ref Range   Lipase 32 11 - 59 U/L  I-stat troponin, ED (not at [facility])  Result Value Ref Range   Troponin i, poc 0.01 0.00 - 0.08 ng/mL   Comment 3           Dg Chest Port 1 View  03/02/2014   CLINICAL DATA:  Chest pain that began earlier this evening - now states left upper leg hurts - hx hypertension - nonsmoker -- pt states no other chest hx  EXAM: PORTABLE CHEST - 1 VIEW  COMPARISON:  02/23/2013  FINDINGS: Cardiac silhouette mildly  enlarged. No mediastinal or hilar masses. No evidence of adenopathy.  Clear lungs.  No pleural effusion or pneumothorax.  Bony thorax is intact.  IMPRESSION: No acute cardiopulmonary disease.   Electronically Signed   By: Lajean Manes M.D.   On: 03/02/2014 21:24    MDM  I discussed case with Dr.Gerhardt .plan:she will be transferred to [facility] for emergency surgery Diagnosis:#1ype A Aotic dissection  #2 hypokalemia CRITICAL CARE Performed by: Orlie Dakin Total critical care time: 40 minutes Critical care time was exclusive of separately billable procedures and treating other patients. Critical care was necessary to treat or prevent imminent or life-threatening deterioration. Critical care was time spent personally by me on the following activities: development of treatment plan with patient and/or surrogate as well as nursing, discussions with consultants, evaluation of patient's response to treatment, examination of patient, obtaining history from patient or surrogate, ordering and performing treatments and interventions, ordering and review of laboratory studies, ordering and review of radiographic studies, pulse oximetry and re-evaluation of patient's condition. Final diagnoses:  None        Orlie Dakin, MD 03/03/14 2876  Orlie Dakin, MD 03/03/14 8115

## 2014-03-02 NOTE — ED Notes (Addendum)
Pt presents with c/o midsternal chest pain onset 45 min ago while standing in kitchen at same time began having heaviness and numbness tingling to L arm and leg. Chest pain resolved on arrival

## 2014-03-03 ENCOUNTER — Emergency Department (HOSPITAL_COMMUNITY): Payer: BLUE CROSS/BLUE SHIELD | Admitting: Certified Registered"

## 2014-03-03 ENCOUNTER — Encounter (HOSPITAL_COMMUNITY)
Admission: EM | Disposition: A | Discharge status: Home Health | Payer: BLUE CROSS/BLUE SHIELD | Source: Home / Self Care | Attending: Cardiothoracic Surgery

## 2014-03-03 ENCOUNTER — Inpatient Hospital Stay (HOSPITAL_COMMUNITY): Payer: BLUE CROSS/BLUE SHIELD

## 2014-03-03 ENCOUNTER — Encounter (HOSPITAL_COMMUNITY): Payer: Self-pay | Admitting: Emergency Medicine

## 2014-03-03 DIAGNOSIS — E119 Type 2 diabetes mellitus without complications: Secondary | ICD-10-CM | POA: Diagnosis present

## 2014-03-03 DIAGNOSIS — R079 Chest pain, unspecified: Secondary | ICD-10-CM | POA: Diagnosis present

## 2014-03-03 DIAGNOSIS — K219 Gastro-esophageal reflux disease without esophagitis: Secondary | ICD-10-CM | POA: Diagnosis present

## 2014-03-03 DIAGNOSIS — E8770 Fluid overload, unspecified: Secondary | ICD-10-CM | POA: Diagnosis not present

## 2014-03-03 DIAGNOSIS — Z9889 Other specified postprocedural states: Secondary | ICD-10-CM

## 2014-03-03 DIAGNOSIS — I10 Essential (primary) hypertension: Secondary | ICD-10-CM | POA: Diagnosis present

## 2014-03-03 DIAGNOSIS — I7101 Dissection of thoracic aorta: Secondary | ICD-10-CM

## 2014-03-03 DIAGNOSIS — D62 Acute posthemorrhagic anemia: Secondary | ICD-10-CM | POA: Diagnosis not present

## 2014-03-03 DIAGNOSIS — N179 Acute kidney failure, unspecified: Secondary | ICD-10-CM | POA: Diagnosis not present

## 2014-03-03 DIAGNOSIS — I7103 Dissection of thoracoabdominal aorta: Secondary | ICD-10-CM | POA: Diagnosis present

## 2014-03-03 DIAGNOSIS — E785 Hyperlipidemia, unspecified: Secondary | ICD-10-CM | POA: Diagnosis present

## 2014-03-03 DIAGNOSIS — D6959 Other secondary thrombocytopenia: Secondary | ICD-10-CM | POA: Diagnosis not present

## 2014-03-03 DIAGNOSIS — E876 Hypokalemia: Secondary | ICD-10-CM | POA: Diagnosis not present

## 2014-03-03 DIAGNOSIS — I351 Nonrheumatic aortic (valve) insufficiency: Secondary | ICD-10-CM | POA: Diagnosis present

## 2014-03-03 HISTORY — PX: REPLACEMENT ASCENDING AORTA: SHX6068

## 2014-03-03 LAB — PROTIME-INR
INR: 0.96 (ref 0.00–1.49)
Prothrombin Time: 12.9 seconds (ref 11.6–15.2)

## 2014-03-03 LAB — POCT I-STAT, CHEM 8
BUN: 12 mg/dL (ref 6–23)
BUN: 11 mg/dL (ref 6–23)
BUN: 10 mg/dL (ref 6–23)
BUN: 12 mg/dL (ref 6–23)
BUN: 12 mg/dL (ref 6–23)
BUN: 10 mg/dL (ref 6–23)
BUN: 9 mg/dL (ref 6–23)
BUN: 12 mg/dL (ref 6–23)
Calcium, Ion: 1.15 mmol/L (ref 1.12–1.23)
Calcium, Ion: 1.16 mmol/L (ref 1.12–1.23)
Calcium, Ion: 0.94 mmol/L — ABNORMAL LOW (ref 1.12–1.23)
Calcium, Ion: 1.02 mmol/L — ABNORMAL LOW (ref 1.12–1.23)
Calcium, Ion: 1 mmol/L — ABNORMAL LOW (ref 1.12–1.23)
Calcium, Ion: 0.62 mmol/L — CL (ref 1.12–1.23)
Calcium, Ion: 0.93 mmol/L — ABNORMAL LOW (ref 1.12–1.23)
Calcium, Ion: 1.15 mmol/L (ref 1.12–1.23)
Chloride: 103 mmol/L (ref 96–112)
Chloride: 104 mmol/L (ref 96–112)
Chloride: 98 mmol/L (ref 96–112)
Chloride: 101 mmol/L (ref 96–112)
Chloride: 101 mmol/L (ref 96–112)
Chloride: 100 mmol/L (ref 96–112)
Chloride: 99 mmol/L (ref 96–112)
Chloride: 107 mmol/L (ref 96–112)
Creatinine, Ser: 0.8 mg/dL (ref 0.50–1.10)
Creatinine, Ser: 0.8 mg/dL (ref 0.50–1.10)
Creatinine, Ser: 0.7 mg/dL (ref 0.50–1.10)
Creatinine, Ser: 1 mg/dL (ref 0.50–1.10)
Creatinine, Ser: 0.9 mg/dL (ref 0.50–1.10)
Creatinine, Ser: 0.8 mg/dL (ref 0.50–1.10)
Creatinine, Ser: 0.8 mg/dL (ref 0.50–1.10)
Creatinine, Ser: 1.2 mg/dL — ABNORMAL HIGH (ref 0.50–1.10)
Glucose, Bld: 141 mg/dL — ABNORMAL HIGH (ref 70–99)
Glucose, Bld: 111 mg/dL — ABNORMAL HIGH (ref 70–99)
Glucose, Bld: 87 mg/dL (ref 70–99)
Glucose, Bld: 199 mg/dL — ABNORMAL HIGH (ref 70–99)
Glucose, Bld: 189 mg/dL — ABNORMAL HIGH (ref 70–99)
Glucose, Bld: 181 mg/dL — ABNORMAL HIGH (ref 70–99)
Glucose, Bld: 203 mg/dL — ABNORMAL HIGH (ref 70–99)
Glucose, Bld: 120 mg/dL — ABNORMAL HIGH (ref 70–99)
HCT: 34 % — ABNORMAL LOW (ref 36.0–46.0)
HCT: 34 % — ABNORMAL LOW (ref 36.0–46.0)
HCT: 23 % — ABNORMAL LOW (ref 36.0–46.0)
HCT: 25 % — ABNORMAL LOW (ref 36.0–46.0)
HCT: 26 % — ABNORMAL LOW (ref 36.0–46.0)
HCT: 24 % — ABNORMAL LOW (ref 36.0–46.0)
HCT: 26 % — ABNORMAL LOW (ref 36.0–46.0)
HCT: 27 % — ABNORMAL LOW (ref 36.0–46.0)
Hemoglobin: 11.6 g/dL — ABNORMAL LOW (ref 12.0–15.0)
Hemoglobin: 11.6 g/dL — ABNORMAL LOW (ref 12.0–15.0)
Hemoglobin: 7.8 g/dL — ABNORMAL LOW (ref 12.0–15.0)
Hemoglobin: 8.5 g/dL — ABNORMAL LOW (ref 12.0–15.0)
Hemoglobin: 8.8 g/dL — ABNORMAL LOW (ref 12.0–15.0)
Hemoglobin: 8.2 g/dL — ABNORMAL LOW (ref 12.0–15.0)
Hemoglobin: 8.8 g/dL — ABNORMAL LOW (ref 12.0–15.0)
Hemoglobin: 9.2 g/dL — ABNORMAL LOW (ref 12.0–15.0)
Potassium: 3.4 mmol/L — ABNORMAL LOW (ref 3.5–5.1)
Potassium: 3.2 mmol/L — ABNORMAL LOW (ref 3.5–5.1)
Potassium: 3.3 mmol/L — ABNORMAL LOW (ref 3.5–5.1)
Potassium: 4.4 mmol/L (ref 3.5–5.1)
Potassium: 3.4 mmol/L — ABNORMAL LOW (ref 3.5–5.1)
Potassium: 3.2 mmol/L — ABNORMAL LOW (ref 3.5–5.1)
Potassium: 3.2 mmol/L — ABNORMAL LOW (ref 3.5–5.1)
Potassium: 3.6 mmol/L (ref 3.5–5.1)
Sodium: 141 mmol/L (ref 135–145)
Sodium: 141 mmol/L (ref 135–145)
Sodium: 137 mmol/L (ref 135–145)
Sodium: 135 mmol/L (ref 135–145)
Sodium: 139 mmol/L (ref 135–145)
Sodium: 141 mmol/L (ref 135–145)
Sodium: 142 mmol/L (ref 135–145)
Sodium: 143 mmol/L (ref 135–145)
TCO2: 23 mmol/L (ref 0–100)
TCO2: 22 mmol/L (ref 0–100)
TCO2: 23 mmol/L (ref 0–100)
TCO2: 21 mmol/L (ref 0–100)
TCO2: 22 mmol/L (ref 0–100)
TCO2: 20 mmol/L (ref 0–100)
TCO2: 23 mmol/L (ref 0–100)
TCO2: 22 mmol/L (ref 0–100)

## 2014-03-03 LAB — POCT I-STAT 3, ART BLOOD GAS (G3+)
Acid-Base Excess: 1 mmol/L (ref 0.0–2.0)
Acid-Base Excess: 1 mmol/L (ref 0.0–2.0)
Acid-base deficit: 4 mmol/L — ABNORMAL HIGH (ref 0.0–2.0)
Acid-base deficit: 2 mmol/L (ref 0.0–2.0)
Acid-base deficit: 1 mmol/L (ref 0.0–2.0)
Bicarbonate: 25.2 mEq/L — ABNORMAL HIGH (ref 20.0–24.0)
Bicarbonate: 21.9 mEq/L (ref 20.0–24.0)
Bicarbonate: 22 mEq/L (ref 20.0–24.0)
Bicarbonate: 25.1 mEq/L — ABNORMAL HIGH (ref 20.0–24.0)
Bicarbonate: 24.6 mEq/L — ABNORMAL HIGH (ref 20.0–24.0)
O2 Saturation: 100 %
O2 Saturation: 100 %
O2 Saturation: 100 %
O2 Saturation: 100 %
O2 Saturation: 95 %
Patient temperature: 34.6
TCO2: 26 mmol/L (ref 0–100)
TCO2: 23 mmol/L (ref 0–100)
TCO2: 23 mmol/L (ref 0–100)
TCO2: 26 mmol/L (ref 0–100)
TCO2: 26 mmol/L (ref 0–100)
pCO2 arterial: 36.6 mmHg (ref 35.0–45.0)
pCO2 arterial: 41.6 mmHg (ref 35.0–45.0)
pCO2 arterial: 31.2 mmHg — ABNORMAL LOW (ref 35.0–45.0)
pCO2 arterial: 36 mmHg (ref 35.0–45.0)
pCO2 arterial: 39 mmHg (ref 35.0–45.0)
pH, Arterial: 7.446 (ref 7.350–7.450)
pH, Arterial: 7.328 — ABNORMAL LOW (ref 7.350–7.450)
pH, Arterial: 7.457 — ABNORMAL HIGH (ref 7.350–7.450)
pH, Arterial: 7.451 — ABNORMAL HIGH (ref 7.350–7.450)
pH, Arterial: 7.397 (ref 7.350–7.450)
pO2, Arterial: 449 mmHg — ABNORMAL HIGH (ref 80.0–100.0)
pO2, Arterial: 325 mmHg — ABNORMAL HIGH (ref 80.0–100.0)
pO2, Arterial: 354 mmHg — ABNORMAL HIGH (ref 80.0–100.0)
pO2, Arterial: 409 mmHg — ABNORMAL HIGH (ref 80.0–100.0)
pO2, Arterial: 70 mmHg — ABNORMAL LOW (ref 80.0–100.0)

## 2014-03-03 LAB — HEPATIC FUNCTION PANEL
ALT: 20 U/L (ref 0–35)
AST: 31 U/L (ref 0–37)
Albumin: 3.9 g/dL (ref 3.5–5.2)
Alkaline Phosphatase: 63 U/L (ref 39–117)
Bilirubin, Direct: 0.2 mg/dL (ref 0.0–0.5)
Indirect Bilirubin: 0.6 mg/dL (ref 0.3–0.9)
Total Bilirubin: 0.8 mg/dL (ref 0.3–1.2)
Total Protein: 6.8 g/dL (ref 6.0–8.3)

## 2014-03-03 LAB — POCT I-STAT 4, (NA,K, GLUC, HGB,HCT)
Glucose, Bld: 117 mg/dL — ABNORMAL HIGH (ref 70–99)
HCT: 34 % — ABNORMAL LOW (ref 36.0–46.0)
Hemoglobin: 11.6 g/dL — ABNORMAL LOW (ref 12.0–15.0)
Potassium: 3.1 mmol/L — ABNORMAL LOW (ref 3.5–5.1)
Sodium: 145 mmol/L (ref 135–145)

## 2014-03-03 LAB — FIBRINOGEN: Fibrinogen: 111 mg/dL — ABNORMAL LOW (ref 204–475)

## 2014-03-03 LAB — CBC
HCT: 33.4 % — ABNORMAL LOW (ref 36.0–46.0)
HCT: 27.4 % — ABNORMAL LOW (ref 36.0–46.0)
Hemoglobin: 10.9 g/dL — ABNORMAL LOW (ref 12.0–15.0)
Hemoglobin: 9 g/dL — ABNORMAL LOW (ref 12.0–15.0)
MCH: 23.4 pg — ABNORMAL LOW (ref 26.0–34.0)
MCH: 23.9 pg — ABNORMAL LOW (ref 26.0–34.0)
MCHC: 32.6 g/dL (ref 30.0–36.0)
MCHC: 32.8 g/dL (ref 30.0–36.0)
MCV: 71.7 fL — ABNORMAL LOW (ref 78.0–100.0)
MCV: 72.7 fL — ABNORMAL LOW (ref 78.0–100.0)
Platelets: 97 10*3/uL — ABNORMAL LOW (ref 150–400)
Platelets: 75 10*3/uL — ABNORMAL LOW (ref 150–400)
RBC: 4.66 MIL/uL (ref 3.87–5.11)
RBC: 3.77 MIL/uL — ABNORMAL LOW (ref 3.87–5.11)
RDW: 16.2 % — ABNORMAL HIGH (ref 11.5–15.5)
RDW: 16.1 % — ABNORMAL HIGH (ref 11.5–15.5)
WBC: 12.5 10*3/uL — ABNORMAL HIGH (ref 4.0–10.5)
WBC: 8.6 10*3/uL (ref 4.0–10.5)

## 2014-03-03 LAB — LIPASE, BLOOD: Lipase: 32 U/L (ref 11–59)

## 2014-03-03 LAB — ABO/RH: ABO/RH(D): A POS

## 2014-03-03 LAB — PLATELET COUNT: Platelets: 73 10*3/uL — ABNORMAL LOW (ref 150–400)

## 2014-03-03 LAB — CREATININE, SERUM
Creatinine, Ser: 1.29 mg/dL — ABNORMAL HIGH (ref 0.50–1.10)
GFR calc Af Amer: 51 mL/min — ABNORMAL LOW (ref >90)
GFR calc non Af Amer: 44 mL/min — ABNORMAL LOW (ref >90)

## 2014-03-03 LAB — APTT: aPTT: 39 seconds — ABNORMAL HIGH (ref 24–37)

## 2014-03-03 LAB — HEMOGLOBIN AND HEMATOCRIT, BLOOD
HCT: 24.1 % — ABNORMAL LOW (ref 36.0–46.0)
Hemoglobin: 7.8 g/dL — ABNORMAL LOW (ref 12.0–15.0)

## 2014-03-03 LAB — MRSA PCR SCREENING: MRSA by PCR: NEGATIVE

## 2014-03-03 LAB — MAGNESIUM: Magnesium: 2.9 mg/dL — ABNORMAL HIGH (ref 1.5–2.5)

## 2014-03-03 SURGERY — REPLACEMENT, AORTA, ASCENDING
Anesthesia: General | Site: Chest

## 2014-03-03 MED ORDER — MORPHINE SULFATE 2 MG/ML IJ SOLN
2.0000 mg | INTRAMUSCULAR | Status: DC | PRN
  Administered 2014-03-04 (×4): 2 mg via INTRAVENOUS
  Administered 2014-03-04: 4 mg via INTRAVENOUS
  Administered 2014-03-04 – 2014-03-05 (×3): 2 mg via INTRAVENOUS
  Filled 2014-03-03 (×4): qty 1
  Filled 2014-03-03: qty 2
  Filled 2014-03-03 (×2): qty 1
  Filled 2014-03-03: qty 2
  Filled 2014-03-03 (×3): qty 1

## 2014-03-03 MED ORDER — NITROGLYCERIN IN D5W 200-5 MCG/ML-% IV SOLN
0.0000 ug/min | INTRAVENOUS | Status: DC
  Administered 2014-03-03: 80 ug/min via INTRAVENOUS
  Administered 2014-03-04: 50 ug/min via INTRAVENOUS
  Filled 2014-03-03 (×2): qty 250

## 2014-03-03 MED ORDER — POTASSIUM CHLORIDE 10 MEQ/50ML IV SOLN
10.0000 meq | INTRAVENOUS | Status: AC
  Administered 2014-03-03 (×3): 10 meq via INTRAVENOUS

## 2014-03-03 MED ORDER — MORPHINE SULFATE 2 MG/ML IJ SOLN
1.0000 mg | INTRAMUSCULAR | Status: AC | PRN
  Administered 2014-03-03 (×2): 2 mg via INTRAVENOUS

## 2014-03-03 MED ORDER — HEPARIN SODIUM (PORCINE) 1000 UNIT/ML IJ SOLN
INTRAMUSCULAR | Status: DC | PRN
  Administered 2014-03-03: 25000 [IU] via INTRAVENOUS

## 2014-03-03 MED ORDER — DEXTROSE 5 % IV SOLN
1.5000 g | INTRAVENOUS | Status: AC
  Administered 2014-03-03: 1.5 g via INTRAVENOUS
  Administered 2014-03-03: .75 g via INTRAVENOUS
  Filled 2014-03-03 (×2): qty 1.5

## 2014-03-03 MED ORDER — MIDAZOLAM HCL 2 MG/2ML IJ SOLN: 2.0000 mg | INTRAMUSCULAR | Status: DC | PRN

## 2014-03-03 MED ORDER — DOPAMINE-DEXTROSE 3.2-5 MG/ML-% IV SOLN
3.0000 ug/kg/min | INTRAVENOUS | Status: DC
  Administered 2014-03-03: 3 ug/kg/min via INTRAVENOUS

## 2014-03-03 MED ORDER — PLASMA-LYTE 148 IV SOLN
INTRAVENOUS | Status: AC
  Administered 2014-03-03: 500 mL
  Filled 2014-03-03: qty 2.5

## 2014-03-03 MED ORDER — ASPIRIN 81 MG PO CHEW: 324.0000 mg | CHEWABLE_TABLET | Freq: Every day | ORAL | Status: DC

## 2014-03-03 MED ORDER — PROPOFOL 10 MG/ML IV BOLUS
INTRAVENOUS | Status: DC | PRN
  Administered 2014-03-03: 50 mg via INTRAVENOUS
  Administered 2014-03-03: 280 mg via INTRAVENOUS
  Administered 2014-03-03: 30 mg via INTRAVENOUS
  Administered 2014-03-03: 50 mg via INTRAVENOUS

## 2014-03-03 MED ORDER — MAGNESIUM SULFATE 50 % IJ SOLN
40.0000 meq | INTRAMUSCULAR | Status: DC
  Filled 2014-03-03: qty 10

## 2014-03-03 MED ORDER — CEFUROXIME SODIUM 1.5 G IJ SOLR
1.5000 g | Freq: Two times a day (BID) | INTRAMUSCULAR | Status: AC
  Administered 2014-03-03 – 2014-03-05 (×4): 1.5 g via INTRAVENOUS
  Filled 2014-03-03 (×4): qty 1.5

## 2014-03-03 MED ORDER — MAGNESIUM SULFATE 4 GM/100ML IV SOLN
4.0000 g | Freq: Once | INTRAVENOUS | Status: AC
Start: 2014-03-03 — End: 2014-03-03
  Administered 2014-03-03: 4 g via INTRAVENOUS
  Filled 2014-03-03: qty 100

## 2014-03-03 MED ORDER — LACTATED RINGERS IV SOLN: INTRAVENOUS | Status: DC

## 2014-03-03 MED ORDER — COAGULATION FACTOR VIIA RECOMB 1 MG IV SOLR
INTRAVENOUS | Status: DC | PRN
  Administered 2014-03-03: 4 mg via INTRAVENOUS

## 2014-03-03 MED ORDER — SODIUM CHLORIDE 0.9 % IJ SOLN
OROMUCOSAL | Status: DC | PRN
  Administered 2014-03-03 (×2): 1 mL via TOPICAL

## 2014-03-03 MED ORDER — DEXTROSE 5 % IV SOLN
750.0000 mg | INTRAVENOUS | Status: DC
  Filled 2014-03-03: qty 750

## 2014-03-03 MED ORDER — PROTAMINE SULFATE 10 MG/ML IV SOLN
INTRAVENOUS | Status: DC | PRN
  Administered 2014-03-03: 190 mg via INTRAVENOUS
  Administered 2014-03-03: 10 mg via INTRAVENOUS

## 2014-03-03 MED ORDER — IOHEXOL 350 MG/ML SOLN
125.0000 mL | Freq: Once | INTRAVENOUS | Status: AC | PRN
  Administered 2014-03-03: 125 mL via INTRAVENOUS

## 2014-03-03 MED ORDER — FENTANYL CITRATE 0.05 MG/ML IJ SOLN
INTRAMUSCULAR | Status: DC | PRN
  Administered 2014-03-03: 100 ug via INTRAVENOUS
  Administered 2014-03-03: 50 ug via INTRAVENOUS
  Administered 2014-03-03 (×2): 250 ug via INTRAVENOUS
  Administered 2014-03-03: 100 ug via INTRAVENOUS
  Administered 2014-03-03: 250 ug via INTRAVENOUS
  Administered 2014-03-03: 100 ug via INTRAVENOUS
  Administered 2014-03-03: 250 ug via INTRAVENOUS
  Administered 2014-03-03: 50 ug via INTRAVENOUS
  Administered 2014-03-03 (×2): 250 ug via INTRAVENOUS
  Administered 2014-03-03: 100 ug via INTRAVENOUS
  Administered 2014-03-03: 150 ug via INTRAVENOUS
  Administered 2014-03-03: 50 ug via INTRAVENOUS
  Administered 2014-03-03: 200 ug via INTRAVENOUS
  Administered 2014-03-03: 100 ug via INTRAVENOUS

## 2014-03-03 MED ORDER — ALBUMIN HUMAN 5 % IV SOLN
250.0000 mL | INTRAVENOUS | Status: AC | PRN
  Administered 2014-03-03 (×4): 250 mL via INTRAVENOUS
  Filled 2014-03-03 (×2): qty 250

## 2014-03-03 MED ORDER — TRAMADOL HCL 50 MG PO TABS
50.0000 mg | ORAL_TABLET | ORAL | Status: DC | PRN
  Administered 2014-03-04 – 2014-03-06 (×3): 100 mg via ORAL
  Filled 2014-03-03 (×3): qty 2

## 2014-03-03 MED ORDER — SODIUM CHLORIDE 0.9 % IV SOLN
INTRAVENOUS | Status: DC
Start: 2014-03-03 — End: 2014-03-03
  Filled 2014-03-03: qty 30

## 2014-03-03 MED ORDER — 0.9 % SODIUM CHLORIDE (POUR BTL) OPTIME
TOPICAL | Status: DC | PRN
  Administered 2014-03-03: 1000 mL

## 2014-03-03 MED ORDER — HEMOSTATIC AGENTS (NO CHARGE) OPTIME
TOPICAL | Status: DC | PRN
  Administered 2014-03-03: 1 via TOPICAL

## 2014-03-03 MED ORDER — DEXMEDETOMIDINE HCL IN NACL 400 MCG/100ML IV SOLN
0.1000 ug/kg/h | INTRAVENOUS | Status: AC
  Administered 2014-03-03: 0.3 ug/kg/h via INTRAVENOUS
  Filled 2014-03-03 (×2): qty 100

## 2014-03-03 MED ORDER — LACTATED RINGERS IV SOLN
INTRAVENOUS | Status: DC | PRN
  Administered 2014-03-03 (×4): via INTRAVENOUS

## 2014-03-03 MED ORDER — ACETAMINOPHEN 160 MG/5ML PO SOLN: 650.0000 mg | Freq: Once | ORAL | Status: AC

## 2014-03-03 MED ORDER — DEXMEDETOMIDINE HCL IN NACL 200 MCG/50ML IV SOLN: 0.0000 ug/kg/h | INTRAVENOUS | Status: DC

## 2014-03-03 MED ORDER — ARTIFICIAL TEARS OP OINT
TOPICAL_OINTMENT | OPHTHALMIC | Status: DC | PRN
  Administered 2014-03-03: 1 via OPHTHALMIC

## 2014-03-03 MED ORDER — FAMOTIDINE IN NACL 20-0.9 MG/50ML-% IV SOLN
20.0000 mg | Freq: Two times a day (BID) | INTRAVENOUS | Status: AC
  Administered 2014-03-03 (×2): 20 mg via INTRAVENOUS
  Filled 2014-03-03: qty 50

## 2014-03-03 MED ORDER — BISACODYL 5 MG PO TBEC
10.0000 mg | DELAYED_RELEASE_TABLET | Freq: Every day | ORAL | Status: DC
  Administered 2014-03-04 – 2014-03-06 (×3): 10 mg via ORAL
  Filled 2014-03-03 (×3): qty 2

## 2014-03-03 MED ORDER — SODIUM CHLORIDE 0.9 % IV SOLN
INTRAVENOUS | Status: AC
  Administered 2014-03-03: 1 [IU]/h via INTRAVENOUS
  Filled 2014-03-03: qty 2.5

## 2014-03-03 MED ORDER — SODIUM CHLORIDE 0.9 % IJ SOLN
3.0000 mL | Freq: Two times a day (BID) | INTRAMUSCULAR | Status: DC
  Administered 2014-03-04 – 2014-03-06 (×5): 3 mL via INTRAVENOUS

## 2014-03-03 MED ORDER — ASPIRIN EC 325 MG PO TBEC
325.0000 mg | DELAYED_RELEASE_TABLET | Freq: Every day | ORAL | Status: DC
  Filled 2014-03-03: qty 1

## 2014-03-03 MED ORDER — LACTATED RINGERS IV SOLN: 500.0000 mL | Freq: Once | INTRAVENOUS | Status: AC | PRN

## 2014-03-03 MED ORDER — METHYLPREDNISOLONE SODIUM SUCC 125 MG IJ SOLR
INTRAMUSCULAR | Status: DC | PRN
Start: 2014-03-03 — End: 2014-03-03
  Administered 2014-03-03: 125 mg via INTRAVENOUS

## 2014-03-03 MED ORDER — CETYLPYRIDINIUM CHLORIDE 0.05 % MT LIQD
7.0000 mL | Freq: Four times a day (QID) | OROMUCOSAL | Status: DC
  Administered 2014-03-03 – 2014-03-04 (×5): 7 mL via OROMUCOSAL

## 2014-03-03 MED ORDER — ROCURONIUM BROMIDE 100 MG/10ML IV SOLN
INTRAVENOUS | Status: DC | PRN
  Administered 2014-03-03: 50 mg via INTRAVENOUS

## 2014-03-03 MED ORDER — VECURONIUM BROMIDE 10 MG IV SOLR
INTRAVENOUS | Status: DC | PRN
  Administered 2014-03-03 (×2): 10 mg via INTRAVENOUS
  Administered 2014-03-03: 5 mg via INTRAVENOUS

## 2014-03-03 MED ORDER — SUCCINYLCHOLINE CHLORIDE 20 MG/ML IJ SOLN
INTRAMUSCULAR | Status: DC | PRN
  Administered 2014-03-03: 140 mg via INTRAVENOUS

## 2014-03-03 MED ORDER — CALCIUM CHLORIDE 10 % IV SOLN
INTRAVENOUS | Status: DC | PRN
  Administered 2014-03-03 (×5): 50 mg via INTRAVENOUS
  Administered 2014-03-03: 100 mg via INTRAVENOUS
  Administered 2014-03-03 (×11): 50 mg via INTRAVENOUS
  Administered 2014-03-03: 100 mg via INTRAVENOUS

## 2014-03-03 MED ORDER — COAGULATION FACTOR VIIA RECOMB 1 MG IV SOLR
45.0000 ug/kg | Freq: Once | INTRAVENOUS | Status: DC
  Filled 2014-03-03: qty 4

## 2014-03-03 MED ORDER — EPINEPHRINE HCL 1 MG/ML IJ SOLN
0.0000 ug/min | INTRAVENOUS | Status: DC
  Filled 2014-03-03: qty 4

## 2014-03-03 MED ORDER — POTASSIUM CHLORIDE 10 MEQ/50ML IV SOLN
10.0000 meq | INTRAVENOUS | Status: AC
  Administered 2014-03-03 (×2): 10 meq via INTRAVENOUS

## 2014-03-03 MED ORDER — PHENYLEPHRINE HCL 10 MG/ML IJ SOLN
30.0000 ug/min | INTRAVENOUS | Status: DC
  Filled 2014-03-03: qty 2

## 2014-03-03 MED ORDER — SODIUM CHLORIDE 0.9 % IV SOLN
INTRAVENOUS | Status: DC
  Filled 2014-03-03: qty 2.5

## 2014-03-03 MED ORDER — VANCOMYCIN HCL IN DEXTROSE 1-5 GM/200ML-% IV SOLN
1000.0000 mg | Freq: Once | INTRAVENOUS | Status: AC
  Administered 2014-03-03: 1000 mg via INTRAVENOUS
  Filled 2014-03-03: qty 200

## 2014-03-03 MED ORDER — ACETAMINOPHEN 160 MG/5ML PO SOLN
1000.0000 mg | Freq: Four times a day (QID) | ORAL | Status: DC
  Administered 2014-03-04: 1000 mg
  Filled 2014-03-03: qty 40.6

## 2014-03-03 MED ORDER — POTASSIUM CHLORIDE 10 MEQ/100ML IV SOLN: 10.0000 meq | Freq: Once | INTRAVENOUS | Status: DC

## 2014-03-03 MED ORDER — INSULIN REGULAR BOLUS VIA INFUSION
0.0000 [IU] | Freq: Three times a day (TID) | INTRAVENOUS | Status: DC
  Filled 2014-03-03: qty 10

## 2014-03-03 MED ORDER — HEMOSTATIC AGENTS (NO CHARGE) OPTIME
TOPICAL | Status: DC | PRN
  Administered 2014-03-03 (×5): 1 via TOPICAL

## 2014-03-03 MED ORDER — VANCOMYCIN HCL 10 G IV SOLR
1250.0000 mg | INTRAVENOUS | Status: AC
  Administered 2014-03-03: 1250 mg via INTRAVENOUS
  Filled 2014-03-03 (×2): qty 1250

## 2014-03-03 MED ORDER — DOCUSATE SODIUM 100 MG PO CAPS
200.0000 mg | ORAL_CAPSULE | Freq: Every day | ORAL | Status: DC
  Administered 2014-03-04 – 2014-03-06 (×3): 200 mg via ORAL
  Filled 2014-03-03 (×3): qty 2

## 2014-03-03 MED ORDER — METOPROLOL TARTRATE 1 MG/ML IV SOLN: 2.5000 mg | INTRAVENOUS | Status: DC | PRN

## 2014-03-03 MED ORDER — DOPAMINE-DEXTROSE 3.2-5 MG/ML-% IV SOLN
0.0000 ug/kg/min | INTRAVENOUS | Status: DC
  Filled 2014-03-03: qty 250

## 2014-03-03 MED ORDER — ESMOLOL HCL 10 MG/ML IV SOLN
INTRAVENOUS | Status: DC | PRN
  Administered 2014-03-03 (×2): 10 mg via INTRAVENOUS

## 2014-03-03 MED ORDER — OXYCODONE HCL 5 MG PO TABS
5.0000 mg | ORAL_TABLET | ORAL | Status: DC | PRN
  Administered 2014-03-04 – 2014-03-05 (×2): 5 mg via ORAL
  Filled 2014-03-03 (×2): qty 1

## 2014-03-03 MED ORDER — ACETAMINOPHEN 500 MG PO TABS
1000.0000 mg | ORAL_TABLET | Freq: Four times a day (QID) | ORAL | Status: DC
  Administered 2014-03-04 – 2014-03-07 (×11): 1000 mg via ORAL
  Filled 2014-03-03 (×16): qty 2

## 2014-03-03 MED ORDER — MIDAZOLAM HCL 5 MG/5ML IJ SOLN
INTRAMUSCULAR | Status: DC | PRN
  Administered 2014-03-03 (×3): 2 mg via INTRAVENOUS
  Administered 2014-03-03 (×2): 3 mg via INTRAVENOUS

## 2014-03-03 MED ORDER — SODIUM CHLORIDE 0.9 % IJ SOLN: 3.0000 mL | INTRAMUSCULAR | Status: DC | PRN

## 2014-03-03 MED ORDER — SODIUM CHLORIDE 0.9 % IV SOLN
INTRAVENOUS | Status: DC
  Filled 2014-03-03 (×2): qty 40

## 2014-03-03 MED ORDER — LIDOCAINE HCL (CARDIAC) 20 MG/ML IV SOLN
INTRAVENOUS | Status: DC | PRN
  Administered 2014-03-03: 100 mg via INTRAVENOUS

## 2014-03-03 MED ORDER — SODIUM CHLORIDE 0.9 % IV SOLN
10.0000 g | INTRAVENOUS | Status: DC | PRN
  Administered 2014-03-03: 5 g/h via INTRAVENOUS

## 2014-03-03 MED ORDER — PANTOPRAZOLE SODIUM 40 MG PO TBEC
40.0000 mg | DELAYED_RELEASE_TABLET | Freq: Every day | ORAL | Status: DC
  Administered 2014-03-05 – 2014-03-06 (×2): 40 mg via ORAL
  Filled 2014-03-03 (×3): qty 1

## 2014-03-03 MED ORDER — METOPROLOL TARTRATE 25 MG/10 ML ORAL SUSPENSION
12.5000 mg | Freq: Two times a day (BID) | ORAL | Status: DC
  Filled 2014-03-03 (×4): qty 5

## 2014-03-03 MED ORDER — POTASSIUM CHLORIDE 2 MEQ/ML IV SOLN
80.0000 meq | INTRAVENOUS | Status: DC
  Filled 2014-03-03: qty 40

## 2014-03-03 MED ORDER — DEXTROSE 5 % IV SOLN
0.0000 ug/min | INTRAVENOUS | Status: DC
  Filled 2014-03-03: qty 2

## 2014-03-03 MED ORDER — SODIUM CHLORIDE 0.9 % IV SOLN: 250.0000 mL | INTRAVENOUS | Status: DC

## 2014-03-03 MED ORDER — ONDANSETRON HCL 4 MG/2ML IJ SOLN
4.0000 mg | Freq: Four times a day (QID) | INTRAMUSCULAR | Status: DC | PRN
  Administered 2014-03-04: 4 mg via INTRAVENOUS
  Filled 2014-03-03: qty 2

## 2014-03-03 MED ORDER — NITROGLYCERIN IN D5W 200-5 MCG/ML-% IV SOLN
2.0000 ug/min | INTRAVENOUS | Status: AC
Start: 2014-03-03 — End: 2014-03-03
  Administered 2014-03-03: 5 ug/min via INTRAVENOUS
  Filled 2014-03-03: qty 250

## 2014-03-03 MED ORDER — ACETAMINOPHEN 650 MG RE SUPP
650.0000 mg | Freq: Once | RECTAL | Status: AC
  Administered 2014-03-03: 650 mg via RECTAL

## 2014-03-03 MED ORDER — SODIUM CHLORIDE 0.9 % IV SOLN
INTRAVENOUS | Status: DC
  Administered 2014-03-03: 10 mL/h via INTRAVENOUS

## 2014-03-03 MED ORDER — CHLORHEXIDINE GLUCONATE 0.12 % MT SOLN
15.0000 mL | Freq: Two times a day (BID) | OROMUCOSAL | Status: DC
  Administered 2014-03-03 – 2014-03-04 (×3): 15 mL via OROMUCOSAL
  Filled 2014-03-03 (×3): qty 15

## 2014-03-03 MED ORDER — BISACODYL 10 MG RE SUPP
10.0000 mg | Freq: Every day | RECTAL | Status: DC
  Filled 2014-03-03: qty 1

## 2014-03-03 MED ORDER — SODIUM CHLORIDE 0.45 % IV SOLN
INTRAVENOUS | Status: DC
  Administered 2014-03-03: 11:00:00 via INTRAVENOUS

## 2014-03-03 MED ORDER — METOPROLOL TARTRATE 12.5 MG HALF TABLET
12.5000 mg | ORAL_TABLET | Freq: Two times a day (BID) | ORAL | Status: DC
  Filled 2014-03-03 (×4): qty 1

## 2014-03-03 SURGICAL SUPPLY — 105 items
ADAPTER CARDIO PERF ANTE/RETRO (ADAPTER) ×2 IMPLANT
APPLICATOR TIP BIOGLUE STANDRD (MISCELLANEOUS) ×2 IMPLANT
APPLICATOR TIP COSEAL (VASCULAR PRODUCTS) ×8 IMPLANT
ATTRACTOMAT 16X20 MAGNETIC DRP (DRAPES) ×2 IMPLANT
BAG DECANTER FOR FLEXI CONT (MISCELLANEOUS) ×2 IMPLANT
BINDER BREAST XLRG (GAUZE/BANDAGES/DRESSINGS) ×2 IMPLANT
BLADE STERNUM SYSTEM 6 (BLADE) ×2 IMPLANT
BLADE SURG 15 STRL LF DISP TIS (BLADE) ×2 IMPLANT
BLADE SURG 15 STRL SS (BLADE) ×2
CANISTER SUCTION 2500CC (MISCELLANEOUS) ×2 IMPLANT
CANN PRFSN .5XCNCT 15X34-48 (MISCELLANEOUS) ×1
CANNULA AORTIC HI-FLOW 6.5M20F (CANNULA) ×2 IMPLANT
CANNULA GUNDRY RCSP 15FR (MISCELLANEOUS) ×2 IMPLANT
CANNULA PRFSN .5XCNCT 15X34-48 (MISCELLANEOUS) ×1 IMPLANT
CANNULA SUMP PERICARDIAL (CANNULA) ×2 IMPLANT
CANNULA VEN 2 STAGE (MISCELLANEOUS) ×1
CANNULA VENOUS DUAL 32/40 (CANNULA) IMPLANT
CATH CPB KIT GERHARDT (MISCELLANEOUS) ×2 IMPLANT
CATH FOLEY 2WAY SLVR 18FR 30CC (CATHETERS) ×2 IMPLANT
CATH HEART VENT LEFT (CATHETERS) ×1 IMPLANT
CATH RETROPLEGIA CORONARY 14FR (CATHETERS) ×2 IMPLANT
CATH THORACIC 28FR (CATHETERS) ×2 IMPLANT
CATH/SQUID NICHOLS JEHLE COR (CATHETERS) IMPLANT
CLIP TI WIDE RED SMALL 24 (CLIP) ×2 IMPLANT
CONN ST 1/4X3/8  BEN (MISCELLANEOUS) ×1
CONN ST 1/4X3/8 BEN (MISCELLANEOUS) ×1 IMPLANT
CONT SPEC 4OZ CLIKSEAL STRL BL (MISCELLANEOUS) ×2 IMPLANT
COVER SURGICAL LIGHT HANDLE (MISCELLANEOUS) ×2 IMPLANT
CRADLE DONUT ADULT HEAD (MISCELLANEOUS) ×2 IMPLANT
DERMABOND ADHESIVE PROPEN (GAUZE/BANDAGES/DRESSINGS) ×1
DERMABOND ADVANCED .7 DNX6 (GAUZE/BANDAGES/DRESSINGS) ×1 IMPLANT
DRAIN CHANNEL 28F RND 3/8 FF (WOUND CARE) ×4 IMPLANT
DRAIN CHANNEL 32F RND 10.7 FF (WOUND CARE) ×2 IMPLANT
DRAPE CARDIOVASCULAR INCISE (DRAPES) ×1
DRAPE SLUSH/WARMER DISC (DRAPES) IMPLANT
DRAPE SRG 135X102X78XABS (DRAPES) ×1 IMPLANT
DRSG AQUACEL AG ADV 3.5X14 (GAUZE/BANDAGES/DRESSINGS) ×2 IMPLANT
ELECT BLADE 4.0 EZ CLEAN MEGAD (MISCELLANEOUS) ×2
ELECT CAUTERY BLADE 6.4 (BLADE) ×2 IMPLANT
ELECT REM PT RETURN 9FT ADLT (ELECTROSURGICAL) ×4
ELECTRODE BLDE 4.0 EZ CLN MEGD (MISCELLANEOUS) ×1 IMPLANT
ELECTRODE REM PT RTRN 9FT ADLT (ELECTROSURGICAL) ×2 IMPLANT
GAUZE SPONGE 4X4 12PLY STRL (GAUZE/BANDAGES/DRESSINGS) ×4 IMPLANT
GLOVE BIO SURGEON STRL SZ 6.5 (GLOVE) ×6 IMPLANT
GLOVE BIOGEL M STER SZ 6 (GLOVE) ×2 IMPLANT
GLOVE BIOGEL PI IND STRL 6 (GLOVE) ×2 IMPLANT
GLOVE BIOGEL PI IND STRL 7.0 (GLOVE) ×10 IMPLANT
GLOVE BIOGEL PI INDICATOR 6 (GLOVE) ×2
GLOVE BIOGEL PI INDICATOR 7.0 (GLOVE) ×10
GOWN STRL REUS W/ TWL LRG LVL3 (GOWN DISPOSABLE) ×6 IMPLANT
GOWN STRL REUS W/TWL LRG LVL3 (GOWN DISPOSABLE) ×6
GRAFT GELWEAVE INPREG 30X30CM (Vascular Products) ×2 IMPLANT
HANDLE STAPLE ENDO GIA SHORT (STAPLE) ×1
HEMOSTAT POWDER SURGIFOAM 1G (HEMOSTASIS) ×6 IMPLANT
HEMOSTAT SURGICEL 2X14 (HEMOSTASIS) ×2 IMPLANT
INSERT FOGARTY SM (MISCELLANEOUS) ×2 IMPLANT
INSERT FOGARTY XLG (MISCELLANEOUS) ×2 IMPLANT
KIT BASIN OR (CUSTOM PROCEDURE TRAY) ×2 IMPLANT
KIT ROOM TURNOVER OR (KITS) ×2 IMPLANT
KIT SUCTION CATH 14FR (SUCTIONS) ×8 IMPLANT
LEAD PACING MYOCARDI (MISCELLANEOUS) ×2 IMPLANT
LINE VENT (MISCELLANEOUS) ×2 IMPLANT
LOOP VESSEL MAXI BLUE (MISCELLANEOUS) ×2 IMPLANT
LOOP VESSEL SUPERMAXI WHITE (MISCELLANEOUS) ×2 IMPLANT
NEEDLE 18GX1X1/2 (RX/OR ONLY) (NEEDLE) IMPLANT
NS IRRIG 1000ML POUR BTL (IV SOLUTION) ×10 IMPLANT
PACK OPEN HEART (CUSTOM PROCEDURE TRAY) ×2 IMPLANT
PAD ARMBOARD 7.5X6 YLW CONV (MISCELLANEOUS) ×4 IMPLANT
RELOAD EGIA 45 TAN VASC (STAPLE) ×2 IMPLANT
SEALANT SURG COSEAL 8ML (VASCULAR PRODUCTS) ×4 IMPLANT
SET CARDIOPLEGIA MPS 5001102 (MISCELLANEOUS) ×2 IMPLANT
SPONGE GAUZE 4X4 12PLY STER LF (GAUZE/BANDAGES/DRESSINGS) ×2 IMPLANT
SPONGE LAP 18X18 X RAY DECT (DISPOSABLE) ×4 IMPLANT
SPONGE LAP 4X18 X RAY DECT (DISPOSABLE) ×10 IMPLANT
STAPLER ENDO GIA 12MM SHORT (STAPLE) ×1 IMPLANT
SUT BONE WAX W31G (SUTURE) ×2 IMPLANT
SUT ETHIBOND 2 0 SH (SUTURE) ×1
SUT ETHIBOND 2 0 SH 36X2 (SUTURE) ×1 IMPLANT
SUT ETHIBOND NAB MH 2-0 36IN (SUTURE) ×2 IMPLANT
SUT PROLENE 3 0 RB 1 (SUTURE) ×2 IMPLANT
SUT PROLENE 3 0 SH 48 (SUTURE) ×8 IMPLANT
SUT PROLENE 3 0 SH1 36 (SUTURE) ×10 IMPLANT
SUT PROLENE 4 0 RB 1 (SUTURE) ×13
SUT PROLENE 4-0 RB1 .5 CRCL 36 (SUTURE) ×13 IMPLANT
SUT PROLENE 5 0 C 1 36 (SUTURE) ×28 IMPLANT
SUT PROLENE 6 0 C 1 30 (SUTURE) ×4 IMPLANT
SUT STEEL 6MS V (SUTURE) ×2 IMPLANT
SUT STEEL SZ 6 DBL 3X14 BALL (SUTURE) ×2 IMPLANT
SUT VIC AB 1 CTX 18 (SUTURE) ×6 IMPLANT
SUT VIC AB 2-0 CT1 27 (SUTURE) ×1
SUT VIC AB 2-0 CT1 TAPERPNT 27 (SUTURE) ×1 IMPLANT
SUT VIC AB 3-0 SH 8-18 (SUTURE) ×2 IMPLANT
SUT VIC AB 3-0 X1 27 (SUTURE) ×2 IMPLANT
SUTURE E-PAK OPEN HEART (SUTURE) ×2 IMPLANT
SYR 10ML KIT SKIN ADHESIVE (MISCELLANEOUS) ×6 IMPLANT
SYSTEM SAHARA CHEST DRAIN ATS (WOUND CARE) ×2 IMPLANT
TAPE CLOTH SURG 4X10 WHT LF (GAUZE/BANDAGES/DRESSINGS) ×2 IMPLANT
TOWEL OR 17X24 6PK STRL BLUE (TOWEL DISPOSABLE) ×4 IMPLANT
TOWEL OR 17X26 10 PK STRL BLUE (TOWEL DISPOSABLE) ×4 IMPLANT
TRAY FOLEY IC TEMP SENS 14FR (CATHETERS) ×2 IMPLANT
TUBE CONNECTING 12X1/4 (SUCTIONS) ×4 IMPLANT
UNDERPAD 30X30 INCONTINENT (UNDERPADS AND DIAPERS) ×2 IMPLANT
VENT LEFT HEART 12002 (CATHETERS) ×2
WATER STERILE IRR 1000ML POUR (IV SOLUTION) ×4 IMPLANT
YANKAUER SUCT BULB TIP NO VENT (SUCTIONS) ×4 IMPLANT

## 2014-03-03 NOTE — Progress Notes (Signed)
Patient ID: Alexis Wheeler, female   DOB: 1955/01/17, 59 y.o.   MRN: [id number] EVENING ROUNDS NOTE :     [address]       [place] [address]             [phone]                 Day of Surgery Procedure(s) (LRB): REPAIR OF ASCENDING AORTIC DISSECTION (N/A)  Total Length of Stay:  LOS: 0 days  BP 96/60 mmHg  Pulse 84  Temp(Src) 97.5 F (36.4 C)  Resp 18  Ht 5\' 4"  (1.626 m)  Wt 207 lb 3.7 oz (94 kg)  BMI 35.55 kg/m2  SpO2 100%  .Intake/Output      02/20 0701 - 02/21 0700   I.V. (mL/kg) 2453.9 (26.1)   Blood 2813   NG/GT 30   IV Piggyback 1650   Total Intake(mL/kg) 6946.9 (73.9)   Urine (mL/kg/hr) 1410 (1.2)   Blood 1855 (1.6)   Chest Tube 120 (0.1)   Total Output 3385   Net +3561.9         . sodium chloride 20 mL/hr at 03/03/14 1115  . [START ON 03/04/2014] sodium chloride    . sodium chloride 10 mL/hr (03/03/14 1138)  . dexmedetomidine 0.1 mcg/kg/hr (03/03/14 1500)  . DOPamine 3 mcg/kg/min (03/03/14 1750)  . insulin (NOVOLIN-R) infusion 2.2 Units/hr (03/03/14 1351)  . lactated ringers    . nitroGLYCERIN 80 mcg/min (03/03/14 1900)  . phenylephrine (NEO-SYNEPHRINE) Adult infusion       Lab Results  Component Value Date   WBC 8.6 03/03/2014   HGB 9.2* 03/03/2014   HCT 27.0* 03/03/2014   PLT 75* 03/03/2014   GLUCOSE 120* 03/03/2014   CHOL 174 07/09/2011   TRIG 61.0 07/09/2011   HDL 52.00 07/09/2011   LDLCALC 110* 07/09/2011   ALT 20 03/02/2014   AST 31 03/02/2014   NA 143 03/03/2014   K 3.6 03/03/2014   CL 107 03/03/2014   CREATININE 1.20* 03/03/2014   BUN 12 03/03/2014   CO2 27 03/02/2014   TSH 0.64 03/11/2012   INR 0.96 03/03/2014   Starting to wake up, follows commands and moves all extremities to command Not bleeding   Grace Isaac MD  Beeper [phone] Office [phone] 03/03/2014 7:27 PM

## 2014-03-03 NOTE — Consult Note (Signed)
[facility]  Patient name: Alexis Wheeler MRN: [id number] DOB: 1955-03-02 Sex: female  REASON FOR CONSULT: Transient ischemia of left lower extremity. Consult is from Dr. Servando Snare.  HPI: Alexis Wheeler is a 59 y.o. female who developed chest pain and numbness in her left lower extremity at 8 PM yesterday. She was initially evaluated at [facility] and then transferred to come with an acute type A aortic dissection. When she arrived at [facility], the paresthesias in the left leg Resolved. She had a diminished but palpable left DP pulse initially but prior to induction of anesthesia there was no pulse on the left. She had palpable right femoral and right dorsalis pedis pulse preoperatively.  She is undergone emergent repair of her type A aortic dissection. I was asked to evaluate her circulation in the left lower extremity intraoperatively.  Past Medical History  Diagnosis Date  . Morbid obesity   . HEMATURIA UNSPECIFIED   . GERD   . MYOSITIS   . HYPERTENSION   . HYPERLIPIDEMIA   . Vertigo     Family History  Problem Relation Age of Onset  . Breast cancer Mother 32  . Breast cancer Sister   . Throat cancer Brother     1 bro living  . Seizures Neg Hx     SOCIAL HISTORY: History  Substance Use Topics  . Smoking status: Never Smoker   . 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 Alcohol Use: No    Allergies  Allergen Reactions  . Ibuprofen     REACTION: Upset GI   REVIEW OF SYSTEMS: Unable to obtain PHYSICAL EXAM: Filed Vitals:   03/03/14 0000 03/03/14 0053 03/03/14 0055 03/03/14 0100  BP: 140/41 148/48 148/47 134/42  Pulse: 67 76 73 73  Resp: 16 15 15 30   Weight:    201 lb 15.1 oz (91.6 kg)  SpO2: 96% 100% 100% 100%   Body mass index is 34.65 kg/(m^2). A limited exam was performed as she is prepped and draped in  surgery. Intraoperatively, I was able to palpate femoral pulses bilaterally. In addition, I could palpate a right dorsalis pedis and posterior tibial pulse. I could also palpate a left dorsalis pedis pulse. She has a monophasic dorsalis pedis and posterior tibial signal on the left with a biphasic right dorsalis pedis and right posterior tibial signal. There is good venous filling in both feet and both feet appear viable and adequately perfused.  DATA:  Lab Results  Component Value Date   WBC 10.1 03/02/2014   HGB 8.8* 03/03/2014   HCT 26.0* 03/03/2014   MCV 72.1* 03/02/2014   PLT 73* 03/03/2014   Lab Results  Component Value Date   NA 141 03/03/2014   K 3.2* 03/03/2014   CL 100 03/03/2014   CO2 27 03/02/2014   Lab Results  Component Value Date   CREATININE 0.80 03/03/2014   CT OF CHEST ABDOMEN AND PELVIS: the dissection begins in the aortic root. 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 The dissection extends through the abdominal aorta into the left common iliac artery. The distal left common iliac artery is narrowed at  the level of the common femoral artery the vessel is widely patent and well opacified.  MEDICAL ISSUES: TYPE A AORTIC DISSECTION: The patient had ischemia of the left lower extremity and CT scan shows that the dissection extends into the left common iliac artery. There was reconstitution of the common femoral artery on the left. There was no evidence of obstructive disease on the right related to the dissection. Fortunately, after repair of her type A aortic dissection I suspect she has reperfused the true lumen as she now has a palpable left femoral pulse and left dorsalis pedis pulse.  I will follow her exam.  DICKSON,CHRISTOPHER S [facility]  [place]: [phone]

## 2014-03-03 NOTE — Progress Notes (Signed)
Dr Servando Snare informed of pt's cardiac index 1.2

## 2014-03-03 NOTE — ED Notes (Signed)
Patient transported to CT 

## 2014-03-03 NOTE — Transfer of Care (Signed)
Immediate Anesthesia Transfer of Care Note  Patient: Alexis Wheeler  Procedure(s) Performed: Procedure(s): REPAIR OF ASCENDING AORTIC DISSECTION (N/A)  Patient Location: SICU  Anesthesia Type:General  Level of Consciousness: Patient remains intubated per anesthesia plan  Airway & Oxygen Therapy: Patient remains intubated per anesthesia plan  Post-op Assessment: Report given to RN  Post vital signs: Reviewed and stable  Last Vitals:  Filed Vitals:   03/03/14 0100  BP: 134/42  Pulse: 73  Resp: 30    Complications: No apparent anesthesia complications

## 2014-03-03 NOTE — Brief Op Note (Addendum)
03/02/2014 - 03/03/2014  7:30 AM  PATIENT:  Alexis Wheeler  59 y.o. female  PRE-OPERATIVE DIAGNOSIS:  Acute type I aortic dissection  POST-OPERATIVE DIAGNOSIS:  Acute type I aortic dissection  PROCEDURE:   REPAIR OF ASCENDING AORTIC DISSECTION (using deep hypothermic circulatory arrest and antegrade cerebral cardioplegia)  30 mm Hemashield Platinum graft  Resuspension of native aortic valve  SURGEON:  Grace Isaac, MD  ASSISTANT: Suzzanne Cloud, PA-C  ANESTHESIA:   general  PATIENT CONDITION:  ICU - intubated and hemodynamically stable.  PRE-OPERATIVE WEIGHT: 91 kg

## 2014-03-03 NOTE — H&P (Signed)
[address]       [place] [address]             [place] Record #[id number] Date of Birth: [birth year]  Referring: Dr. Cathleen Fears Primary Care: [name], MD  Chief Complaint:    Chief Complaint  Patient presents with  . Chest Pain  . Numbness    History of Present Illness:     Patien transferred to [facility] with acute Type 1 aortic dissection. She presented complaining of f anterior chest pain radiating to the epigastrium accompanied by numbness of left leg which started at 8 PM 2/19. Associated symptoms include mild shortness of breath and nausea. No sweatiness. No other associated symptoms. Nothing makes symptoms better or worse. She denies arm pain. No other associated symptoms. Pain is moderate to severe present. By arrival to [facility] she notes left leg numbness resolved. No previous cardiac history other than hypertension. She notes that her mother died of aortic aneurysm 82 years ago at age 54, she was unsure of any details of this.    Current Activity/ Functional Status: Patient was independent with mobility/ambulation, transfers, ADL's, IADL's.   Zubrod Score: At the time of surgery this patient's most appropriate activity status/level should be described as: [x]     0    Normal activity, no symptoms []     1    Restricted in physical strenuous activity but ambulatory, able to do out light work []     2    Ambulatory and capable of self care, unable to do work activities, up and about                 more than 50%  Of the time                            []     3    Only limited self care, in bed greater than 50% of waking hours []     4    Completely disabled, no self care, confined to bed or chair []     5    Moribund  Past Medical History  Diagnosis Date  . Morbid obesity   . HEMATURIA UNSPECIFIED   . GERD   . MYOSITIS   . HYPERTENSION   . HYPERLIPIDEMIA   . Vertigo     Past Surgical History    Procedure Laterality Date  . Cholecystectomy  2001  . Abdominal hysterectomy  1983    History  Smoking status  . 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 Marital Status: Married    Spouse Name: N/A  . Number of Children: One daughter and one step daughter  . Years of Education: N/A   Occupational History  . Not on file.   Social History Main Topics  . Smoking status: Never Smoker   . 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 Alcohol Use: No  . Drug Use: No  . Sexual Activity: No   Other Topics Concern  .  Not on file   Social History Narrative    Allergies  Allergen Reactions  . Ibuprofen     REACTION: Upset GI   Medications Prior to Admission  Medication Sig Dispense Refill  . amLODipine (NORVASC) 10 MG tablet Take 1 tablet (10 mg total) by mouth daily. 90 tablet 3  . amLODipine (NORVASC) 5 MG tablet   4  . Ascorbic Acid (VITAMIN C) 500 MG tablet Take 500 mg by mouth daily.      . Cholecalciferol (VITAMIN D3) 1000 UNITS CAPS Take 1,000 Units by mouth daily.      [name] triamterene-hydrochlorothiazide (MAXZIDE-25) 37.5-25 MG per tablet Take 1 tablet by mouth daily before breakfast.    . Acetaminophen (PAIN RELIEF EXTRA STRENGTH PO) Take 2 tablets by mouth daily as needed (pain).    [name] amLODipine (NORVASC) 5 MG tablet TAKE ONE TABLET BY MOUTH ONCE DAILY (Patient not taking: Reported on 03/02/2014) 90 tablet 3  . gabapentin (NEURONTIN) 100 MG capsule Take 1 capsule (100 mg total) by mouth at bedtime. (Patient not taking: Reported on 03/02/2014) 90 capsule 3  . loratadine (CLARITIN) 10 MG tablet Take 1 tablet (10 mg total) by mouth daily. (Patient not taking: Reported on 03/02/2014) 30 tablet 2  . naproxen  (NAPROSYN) 500 MG tablet Take 1 tablet (500 mg total) by mouth 2 (two) times daily with a meal. Start AFTER you are done with Predsnisone 60 tablet 0  . traMADol (ULTRAM) 50 MG tablet Take 1 tablet (50 mg total) by mouth every 8 (eight) hours as needed. 30 tablet 0   Current Facility-Administered Medications  Medication Dose Route Frequency Provider Last Rate Last Dose  . aminocaproic acid (AMICAR) 10 g in sodium chloride 0.9 % 100 mL infusion   Intravenous To OR Grace Isaac, MD      . cefUROXime (ZINACEF) 1.5 g in dextrose 5 % 50 mL IVPB  1.5 g Intravenous To OR Grace Isaac, MD      . cefUROXime (ZINACEF) 750 mg in dextrose 5 % 50 mL IVPB  750 mg Intravenous To OR Grace Isaac, MD      . dexmedetomidine (PRECEDEX) 400 MCG/100ML (4 mcg/mL) infusion  0.1-0.7 mcg/kg/hr Intravenous To OR Grace Isaac, MD      . DOPamine (INTROPIN) 800 mg in dextrose 5 % 250 mL (3.2 mg/mL) infusion  0-10 mcg/kg/min Intravenous To OR Grace Isaac, MD      . EPINEPHrine (ADRENALIN) 4 mg in dextrose 5 % 250 mL (0.016 mg/mL) infusion  0-10 mcg/min Intravenous To OR Grace Isaac, MD      . heparin 2,500 Units, papaverine 30 mg in electrolyte-148 (PLASMALYTE-148) 500 mL irrigation   Irrigation To OR Grace Isaac, MD      . heparin 30,000 units/NS 1000 mL solution for CELLSAVER   Other To OR Grace Isaac, MD      . insulin regular (NOVOLIN R,HUMULIN R) 250 Units in sodium chloride 0.9 % 250 mL (1 Units/mL) infusion   Intravenous To OR Grace Isaac, MD      . magnesium sulfate (IV Push/IM) injection 40 mEq  40 mEq Other To OR Grace Isaac, MD      . morphine 4 MG/ML injection 4 mg  4 mg Intravenous Once Orlie Dakin, MD      . nitroGLYCERIN 50 mg in dextrose 5 % 250 mL (0.2 mg/mL) infusion  2-200 mcg/min Intravenous To OR Grace Isaac, MD      .  ondansetron (ZOFRAN) injection 4 mg  4 mg Intravenous Once Orlie Dakin, MD      . phenylephrine (NEO-SYNEPHRINE) 20 mg in  dextrose 5 % 250 mL (0.08 mg/mL) infusion  30-200 mcg/min Intravenous To OR Grace Isaac, MD      . potassium chloride 10 mEq in 100 mL IVPB  10 mEq Intravenous Once Orlie Dakin, MD      . potassium chloride injection 80 mEq  80 mEq Other To OR Grace Isaac, MD      . vancomycin (VANCOCIN) 1,250 mg in sodium chloride 0.9 % 250 mL IVPB  1,250 mg Intravenous To OR Grace Isaac, MD        Prescriptions prior to admission  Medication Sig Dispense Refill Last Dose  . amLODipine (NORVASC) 10 MG tablet Take 1 tablet (10 mg total) by mouth daily. 90 tablet 3 03/02/2014 at Unknown time  . amLODipine (NORVASC) 5 MG tablet   4   . Ascorbic Acid (VITAMIN C) 500 MG tablet Take 500 mg by mouth daily.     Past Month at Unknown time  . Cholecalciferol (VITAMIN D3) 1000 UNITS CAPS Take 1,000 Units by mouth daily.     Past Month at Unknown time  . triamterene-hydrochlorothiazide (MAXZIDE-25) 37.5-25 MG per tablet Take 1 tablet by mouth daily before breakfast.   03/02/2014 at Unknown time  . Acetaminophen (PAIN RELIEF EXTRA STRENGTH PO) Take 2 tablets by mouth daily as needed (pain).   unknown at unknown  . amLODipine (NORVASC) 5 MG tablet TAKE ONE TABLET BY MOUTH ONCE DAILY (Patient not taking: Reported on 03/02/2014) 90 tablet 3   . gabapentin (NEURONTIN) 100 MG capsule Take 1 capsule (100 mg total) by mouth at bedtime. (Patient not taking: Reported on 03/02/2014) 90 capsule 3   . loratadine (CLARITIN) 10 MG tablet Take 1 tablet (10 mg total) by mouth daily. (Patient not taking: Reported on 03/02/2014) 30 tablet 2 Taking  . naproxen (NAPROSYN) 500 MG tablet Take 1 tablet (500 mg total) by mouth 2 (two) times daily with a meal. Start AFTER you are done with Predsnisone 60 tablet 0 Taking  . traMADol (ULTRAM) 50 MG tablet Take 1 tablet (50 mg total) by mouth every 8 (eight) hours as needed. 30 tablet 0 Taking    Family History  Problem Relation Age of Onset  . Breast cancer Mother 36  . Breast  cancer Sister   . Throat cancer Brother     1 bro living  . Seizures Neg Hx      Review of Systems:      Cardiac Review of Systems: Y or N  Chest Pain [ y  ]  Resting SOB [n  ] Exertional SOB  [n  ]  Orthopnea [ n ]   Pedal Edema [  n ]    Palpitations [  n] Syncope  [n]   Presyncope [ y  ]  General Review of Systems: [Y] = yes [  ]=no Constitional: recent weight change [  ]; anorexia [  ]; fatigue [  ]; nausea [  ]; night sweats [  ]; fever [  ]; or chills [  ]  Dental: poor dentition[  n]; Last Dentist visit:   Eye : blurred vision [ n ]; diplopia [   ]; vision changes [  ];  Amaurosis fugax[  ]; Resp: cough [  ];  wheezing[n  ];  hemoptysis[n  ]; shortness of breath[  ]; paroxysmal nocturnal dyspnea[  ]; dyspnea on exertion[  ]; or orthopnea[  ];  GI:  gallstones[  ], vomiting[  ];  dysphagia[  ]; melena[  ];  hematochezia [  ]; heartburn[  ];   Hx of  Colonoscopy[  ]; GU: kidney stones [  ]; hematuria[  ];   dysuria [  ];  nocturia[  ];  history of     obstruction [  ]; urinary frequency [  ]             Skin: rash, swelling[  ];, hair loss[  ];  peripheral edema[  ];  or itching[  ]; Musculosketetal: myalgias[  ];  joint swelling[  ];  joint erythema[  ];  joint pain[  ];  back pain[  ];  Heme/Lymph: bruising[  ];  bleeding[  ];  anemia[  ];  Neuro: TIA[  ];  headaches[  ];  stroke[  ];  vertigo[  ];  seizures[ n ];   paresthesias[ n ];  difficulty walking[n  ];  Psych:depression[  ]; anxiety[  ];  Endocrine: diabetes[ n ];  thyroid dysfunction[ n ];  Immunizations: Flu [  ]; Pneumococcal[  ];  Other:  Physical Exam: BP 134/42 mmHg  Pulse 73  Resp 30  Wt 201 lb 15.1 oz (91.6 kg)  SpO2 100%   General appearance: alert, cooperative, appears older than stated age and moderate distress Head: Normocephalic, without obvious abnormality, atraumatic Neck: no adenopathy, no carotid bruit, no JVD, supple, symmetrical,  trachea midline and thyroid not enlarged, symmetric, no tenderness/mass/nodules Lymph nodes: Cervical, supraclavicular, and axillary nodes normal. Resp: lungs clear Back: symmetric, no curvature. ROM normal. No CVA tenderness. Cardio: diastolic murmur: early diastolic 2/6, decrescendo at lower left sternal border GI: soft, non-tender; bowel sounds normal; no masses,  no organomegaly Extremities: extremities normal, atraumatic, no cyanosis or edema, Homans sign is negative, no sign of DVT and senation and movement intact both loer extremities Neurologic: Mental status: Alert, oriented, thought content appropriate Cranial nerves: normal Sensory: normal Motor: grossly normal Brachial and radial pulses present bilaterally Right femoral popliteal DP and PT pulses easily palpable bilaterally left DP and PT pulses faintly palpable but definite decreased from the right side Dissection was used to 2 days and then 5 Diagnostic Studies & Laboratory data:     Recent Radiology Findings:   Dg Chest Port 1 View  03/02/2014   CLINICAL DATA:  Chest pain that began earlier this evening - now states left upper leg hurts - hx hypertension - nonsmoker -- pt states no other chest hx  EXAM: PORTABLE CHEST - 1 VIEW  COMPARISON:  02/23/2013  FINDINGS: Cardiac silhouette mildly enlarged. No mediastinal or hilar masses. No evidence of adenopathy.  Clear lungs.  No pleural effusion or pneumothorax.  Bony thorax is intact.  IMPRESSION: No acute cardiopulmonary disease.   Electronically Signed   By: Lajean Manes M.D.   On: 03/02/2014 21:24  CLINICAL DATA: Pt presents with c/o midsternal chest pain onset 45 min ago while standing in kitchen at same time began having heaviness and numbness tingling to L arm and leg. Chest pain resolved on arrival  EXAM: CT ANGIOGRAPHY AOBIFEM WITHOUT  AND WITH CONTRAST; CT ANGIOGRAPHY CHEST  TECHNIQUE: Initial CT imaging of the chest was performed without contrast. CT imaging was  then acquired through the chest, abdomen and pelvis and continued through the lower extremities during and following the rapid infusion of intravenous contrast. Computer-generated coronal and sagittal MIP and MPR reconstructed images were also acquired.  CONTRAST: 126mL OMNIPAQUE IOHEXOL 350 MG/ML SOLN  COMPARISON: Abdomen and pelvis CT, 11/04/2010  FINDINGS: Angiographic Exam:  There is a type A dissection of the thoracoabdominal aorta. The dissection begins at the aortic root extending across the aortic arch and into the proximal origin of the innominate artery. Dissection extends across the arch through the descending aorta, throughout the abdominal aorta and into the left common iliac artery. Flow to the innominate artery is primarily from the false channel. Flow to the left common carotid and left subclavian arteries is from the false channel. Flow to the celiac axis, superior mesenteric artery and right renal artery is from the false channel. Flow to the left kidney is from the true lumen. The primary left renal artery is occluded just beyond its origin. A secondary branch to the anterior lower pole of the left kidney appears to arise at least in part from the false channel. This portion of the kidney shows normal enhancement. The remainder the kidney does not enhance consistent with infarction. Inferior mesenteric artery is patent arising from the false channel.  Thrombosis in the distal left common iliac artery causes severe stenosis of this vessel. There is a near complete occlusion at the origin of the left external iliac artery and again along the distal aspect of the external iliac artery. Vessels than reconstituted, with normal appearing flow seen in the common femoral artery. Right common iliac artery has its flow from false channel and is widely patent as are the external and internal iliac vessels on the right.  Vessels are widely patent to both lower  extremities with no significant atherosclerotic plaque and no dissection.  Non Angiographic Exam:  Heart is mildly enlarged. Pulmonary arteries are normal in caliber. Ascending aorta measures 4 cm in diameter. No mediastinal or hilar masses or pathologically enlarged lymph nodes. Lungs show peripheral reticular opacities consistent with a combination of subsegmental atelectasis and interstitial scarring/thickening. No convincing pneumonia. No overt pulmonary edema. No pleural effusion.  Liver, spleen and pancreas are unremarkable. Gallbladder surgically absent. No bile duct dilation. 17 mm low-density left adrenal mass similar to the prior exam consistent with an adenoma. Normal right adrenal gland.  Right kidney shows normal enhancement. No mass or stone. 12 mm stone lies in the left renal pelvis. As mentioned above, most of the left kidney shows abnormal decreased enhancement. This is consistent with infarction.  Bladder is unremarkable. Uterus is surgically absent.  No pathologically enlarged lymph nodes. No abnormal fluid collections.  There are scattered colonic diverticula. No diverticulitis. Bowel otherwise unremarkable.  Review of the MIP images confirms the above findings.  IMPRESSION: 1. 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 Dissection begins at the aortic root. The ascending aorta is mildly dilated to 4 cm. Most of the aortic branch vessels arise from the false lumen, which shows more dense contrast enhancement. 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 2. Dissection extends through the abdominal aorta into the left common iliac artery. Distal left common  iliac artery is severely narrowed. This extends into the external iliac artery on the left where there is  additional severe narrowing to near occlusion along its distal aspect. The common femoral artery, however, is widely patent and well opacified, receiving accessory flow from collateral vessels. 3. No significant plaque or stenosis or dissection of the lower extremity vessels.   Electronically Signed By: Lajean Manes M.D. On: 03/03/2014 01:13   I have independently reviewed the above radiologic studies.  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 The goals risks and alternatives of the planned surgical procedureacute repair of type I aortic dissection with circulatory arrest and possible replacement of aortic valve.  have been discussed with the patient in detail. The risks of the procedure including death, infection, stroke, myocardial infarction, bleeding, blood transfusion have all been discussed specifically.  I have quoted [name] a 30 % of perioperative mortality and a complication rate as high as 75 %. The patient's questions have been answered.[name] is willing  to proceed with the planned procedure. If valve replacement needed will use tissue valve.    Grace Isaac MD      [address] [place] [address] Office [phone]   Beeper [phone]  03/03/2014 2:10 AM

## 2014-03-03 NOTE — Anesthesia Postprocedure Evaluation (Signed)
  Anesthesia Post-op Note  Patient: Alexis Wheeler  Procedure(s) Performed: Procedure(s): REPAIR OF ASCENDING AORTIC DISSECTION (N/A)  Patient Location: SICU  Anesthesia Type:General  Level of Consciousness: sedated and Patient remains intubated per anesthesia plan  Airway and Oxygen Therapy: Patient remains intubated per anesthesia plan and Patient placed on Ventilator (see vital sign flow sheet for setting)  Post-op Pain: none  Post-op Assessment: Post-op Vital signs reviewed, Patient's Cardiovascular Status Stable, Respiratory Function Stable, Patent Airway, No signs of Nausea or vomiting and Pain level controlled  Post-op Vital Signs: stable  Last Vitals:  Filed Vitals:   03/03/14 1700  BP: 95/60  Pulse: 91  Temp: 36.2 C  Resp: 12    Complications: No apparent anesthesia complications

## 2014-03-03 NOTE — ED Notes (Signed)
Report given to [facility] and CRNA in OR holding

## 2014-03-03 NOTE — Addendum Note (Signed)
Addendum  created 03/03/14 2044 by Roberts Gaudy, MD   Modules edited: Notes Section   Notes Section:  File: [id number]; Pend: [id number]; Raelyn Number: [id number]; Pend: [id number]

## 2014-03-03 NOTE — CV Procedure (Signed)
Intra-operative Transesophageal Echocardiography Report:  Alexis Wheeler is a 59 year old female with obesity and hypertension who suffered an acute Type 1 aortic dissection. She was brought to the operating room at [facility] and underwent repair of the ascending aortic dissection by Dr. Servando Snare.  This report covers the part of the examination that occurred following the repair of the dissection in the post-bypass period. The patient was under general endotracheal anesthesia and the transesophageal echocardiography probe was in place at the time of the examination.  Impression:  1. Aortic valve: The aortic valve was trileaflet. The leaflets opened without restriction. There was no aortic insufficiency that could be appreciated.  2. Mitral valve: The mitral leaflets opened without restriction. There was trace mitral insufficiency. There were no prolapsing or flail segments noted.  3. Left ventricle: Following separation from cardiopulmonary bypass and adequate filling of the left ventricle, there appeared to be good contractility in all segments interrogated and the ejection fraction was estimated at 60-65%. There appeared to be mild concentric left ventricular hypertrophy. Left ventricle wall thickness measured 1.0-1.1 cm in diameter at end diastole concentrically.  4. Right ventricle: The right ventricular cavity was of normal size. There appeared to be adequate contractility of the right ventricular free wall and normal appearing right ventricle function.  5. Tricuspid valve: The tricuspid valve appeared structurally normal and there was trace tricuspid insufficiency.   6. Interatrial septum: There was no evidence of patent foramen ovale or atrial septal defect by color Doppler.  7. Left atrium: There is no thrombus noted within the left atrium or left atrial appendage.  8. Ascending aorta: The aortic root and sinotubular ridge appeared to be intact. There was no evidence of a  dissection flap noted. There appeared to be a graft sewn into the proximal ascending aorta just distal to the sinotubular junction. There was no evidence of anastomotic  leak.  9. Pericardium: There was no evidence of pericardial fluid noted.   Roberts Gaudy, MD

## 2014-03-03 NOTE — Progress Notes (Signed)
Dr Servando Snare notified of cardiac index 1.6 after albuminX4 orders received.Dopamine started at 79mcg/kg/min

## 2014-03-03 NOTE — Progress Notes (Signed)
° ° ° ° ° ° ° ° ° ° ° ° ° °  Aortic dissection repair

## 2014-03-03 NOTE — Anesthesia Preprocedure Evaluation (Addendum)
Anesthesia Evaluation  Patient identified by MRN, date of birth, ID band Patient awake    Reviewed: Allergy & Precautions, NPO status , Patient's Chart, lab work & pertinent test resultsPreop documentation limited or incomplete due to emergent nature of procedure.  History of Anesthesia Complications Negative for: history of anesthetic complications  Airway Mallampati: II  TM Distance: >3 FB Neck ROM: Full    Dental  (+) Partial Upper, Partial Lower   Pulmonary          Cardiovascular hypertension, Pt. 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  Reproductive/Obstetrics                            Anesthesia Physical Anesthesia Plan  ASA: IV and emergent  Anesthesia Plan: General   Post-op Pain Management:    Induction: Intravenous and Rapid sequence  Airway Management Planned: Oral ETT  Additional Equipment: Arterial line, CVP, PA Cath and TEE  Intra-op Plan:   Post-operative Plan: Post-operative intubation/ventilation  Informed Consent: I have reviewed the patients History and Physical, chart, labs and discussed the procedure including the risks, benefits and alternatives for the proposed anesthesia with the patient or authorized representative who has indicated his/her understanding and acceptance.   Dental advisory given  Plan Discussed with: Anesthesiologist, Surgeon and CRNA  Anesthesia Plan Comments:        Anesthesia Quick Evaluation

## 2014-03-04 ENCOUNTER — Inpatient Hospital Stay (HOSPITAL_COMMUNITY): Payer: BLUE CROSS/BLUE SHIELD

## 2014-03-04 LAB — POCT I-STAT, CHEM 8
BUN: 16 mg/dL (ref 6–23)
Calcium, Ion: 1.09 mmol/L — ABNORMAL LOW (ref 1.12–1.23)
Chloride: 113 mmol/L — ABNORMAL HIGH (ref 96–112)
Creatinine, Ser: 1.5 mg/dL — ABNORMAL HIGH (ref 0.50–1.10)
Glucose, Bld: 134 mg/dL — ABNORMAL HIGH (ref 70–99)
HCT: 24 % — ABNORMAL LOW (ref 36.0–46.0)
Hemoglobin: 8.2 g/dL — ABNORMAL LOW (ref 12.0–15.0)
Potassium: 3.6 mmol/L (ref 3.5–5.1)
Sodium: 137 mmol/L (ref 135–145)
TCO2: 23 mmol/L (ref 0–100)

## 2014-03-04 LAB — PREPARE PLATELET PHERESIS
Unit division: 0
Unit division: 0

## 2014-03-04 LAB — PREPARE CRYOPRECIPITATE
Unit division: 0
Unit division: 0

## 2014-03-04 LAB — CBC
HCT: 25.8 % — ABNORMAL LOW (ref 36.0–46.0)
HCT: 25.1 % — ABNORMAL LOW (ref 36.0–46.0)
Hemoglobin: 8.5 g/dL — ABNORMAL LOW (ref 12.0–15.0)
Hemoglobin: 8.1 g/dL — ABNORMAL LOW (ref 12.0–15.0)
MCH: 23.5 pg — ABNORMAL LOW (ref 26.0–34.0)
MCH: 23.3 pg — ABNORMAL LOW (ref 26.0–34.0)
MCHC: 32.9 g/dL (ref 30.0–36.0)
MCHC: 32.3 g/dL (ref 30.0–36.0)
MCV: 71.5 fL — ABNORMAL LOW (ref 78.0–100.0)
MCV: 72.3 fL — ABNORMAL LOW (ref 78.0–100.0)
Platelets: 75 10*3/uL — ABNORMAL LOW (ref 150–400)
Platelets: 69 10*3/uL — ABNORMAL LOW (ref 150–400)
RBC: 3.61 MIL/uL — ABNORMAL LOW (ref 3.87–5.11)
RBC: 3.47 MIL/uL — ABNORMAL LOW (ref 3.87–5.11)
RDW: 16.3 % — ABNORMAL HIGH (ref 11.5–15.5)
RDW: 16.8 % — ABNORMAL HIGH (ref 11.5–15.5)
WBC: 11.4 10*3/uL — ABNORMAL HIGH (ref 4.0–10.5)
WBC: 15.2 10*3/uL — ABNORMAL HIGH (ref 4.0–10.5)

## 2014-03-04 LAB — GLUCOSE, CAPILLARY
Glucose-Capillary: 100 mg/dL — ABNORMAL HIGH (ref 70–99)
Glucose-Capillary: 103 mg/dL — ABNORMAL HIGH (ref 70–99)
Glucose-Capillary: 104 mg/dL — ABNORMAL HIGH (ref 70–99)
Glucose-Capillary: 105 mg/dL — ABNORMAL HIGH (ref 70–99)
Glucose-Capillary: 108 mg/dL — ABNORMAL HIGH (ref 70–99)
Glucose-Capillary: 108 mg/dL — ABNORMAL HIGH (ref 70–99)
Glucose-Capillary: 113 mg/dL — ABNORMAL HIGH (ref 70–99)
Glucose-Capillary: 115 mg/dL — ABNORMAL HIGH (ref 70–99)
Glucose-Capillary: 116 mg/dL — ABNORMAL HIGH (ref 70–99)
Glucose-Capillary: 117 mg/dL — ABNORMAL HIGH (ref 70–99)
Glucose-Capillary: 121 mg/dL — ABNORMAL HIGH (ref 70–99)
Glucose-Capillary: 122 mg/dL — ABNORMAL HIGH (ref 70–99)
Glucose-Capillary: 122 mg/dL — ABNORMAL HIGH (ref 70–99)
Glucose-Capillary: 127 mg/dL — ABNORMAL HIGH (ref 70–99)
Glucose-Capillary: 130 mg/dL — ABNORMAL HIGH (ref 70–99)
Glucose-Capillary: 133 mg/dL — ABNORMAL HIGH (ref 70–99)
Glucose-Capillary: 134 mg/dL — ABNORMAL HIGH (ref 70–99)
Glucose-Capillary: 135 mg/dL — ABNORMAL HIGH (ref 70–99)
Glucose-Capillary: 137 mg/dL — ABNORMAL HIGH (ref 70–99)
Glucose-Capillary: 144 mg/dL — ABNORMAL HIGH (ref 70–99)
Glucose-Capillary: 84 mg/dL (ref 70–99)
Glucose-Capillary: 89 mg/dL (ref 70–99)
Glucose-Capillary: 93 mg/dL (ref 70–99)
Glucose-Capillary: 98 mg/dL (ref 70–99)

## 2014-03-04 LAB — BASIC METABOLIC PANEL
Anion gap: 4 — ABNORMAL LOW (ref 5–15)
BUN: 13 mg/dL (ref 6–23)
CO2: 27 mmol/L (ref 19–32)
Calcium: 7.8 mg/dL — ABNORMAL LOW (ref 8.4–10.5)
Chloride: 113 mmol/L — ABNORMAL HIGH (ref 96–112)
Creatinine, Ser: 1.31 mg/dL — ABNORMAL HIGH (ref 0.50–1.10)
GFR calc Af Amer: 51 mL/min — ABNORMAL LOW (ref >90)
GFR calc non Af Amer: 44 mL/min — ABNORMAL LOW (ref >90)
Glucose, Bld: 107 mg/dL — ABNORMAL HIGH (ref 70–99)
Potassium: 3.1 mmol/L — ABNORMAL LOW (ref 3.5–5.1)
Sodium: 144 mmol/L (ref 135–145)

## 2014-03-04 LAB — MAGNESIUM
Magnesium: 2.7 mg/dL — ABNORMAL HIGH (ref 1.5–2.5)
Magnesium: 2.6 mg/dL — ABNORMAL HIGH (ref 1.5–2.5)

## 2014-03-04 LAB — PREPARE FRESH FROZEN PLASMA
Unit division: 0
Unit division: 0

## 2014-03-04 LAB — POCT I-STAT 3, ART BLOOD GAS (G3+)
Acid-Base Excess: 1 mmol/L (ref 0.0–2.0)
Acid-Base Excess: 1 mmol/L (ref 0.0–2.0)
Bicarbonate: 25.9 mEq/L — ABNORMAL HIGH (ref 20.0–24.0)
Bicarbonate: 26.1 mEq/L — ABNORMAL HIGH (ref 20.0–24.0)
O2 Saturation: 96 %
O2 Saturation: 98 %
Patient temperature: 37
Patient temperature: 37
TCO2: 27 mmol/L (ref 0–100)
TCO2: 27 mmol/L (ref 0–100)
pCO2 arterial: 41.9 mmHg (ref 35.0–45.0)
pCO2 arterial: 43.4 mmHg (ref 35.0–45.0)
pH, Arterial: 7.398 (ref 7.350–7.450)
pH, Arterial: 7.388 (ref 7.350–7.450)
pO2, Arterial: 85 mmHg (ref 80.0–100.0)
pO2, Arterial: 100 mmHg (ref 80.0–100.0)

## 2014-03-04 LAB — CREATININE, SERUM
Creatinine, Ser: 1.6 mg/dL — ABNORMAL HIGH (ref 0.50–1.10)
GFR calc Af Amer: 40 mL/min — ABNORMAL LOW (ref >90)
GFR calc non Af Amer: 34 mL/min — ABNORMAL LOW (ref >90)

## 2014-03-04 MED ORDER — METOPROLOL TARTRATE 25 MG PO TABS
25.0000 mg | ORAL_TABLET | Freq: Two times a day (BID) | ORAL | Status: DC
  Administered 2014-03-04 (×2): 25 mg via ORAL
  Filled 2014-03-04 (×4): qty 1

## 2014-03-04 MED ORDER — ASPIRIN 81 MG PO CHEW: 81.0000 mg | CHEWABLE_TABLET | Freq: Every day | ORAL | Status: DC

## 2014-03-04 MED ORDER — ASPIRIN EC 81 MG PO TBEC
81.0000 mg | DELAYED_RELEASE_TABLET | Freq: Every day | ORAL | Status: DC
  Administered 2014-03-04 – 2014-03-06 (×3): 81 mg via ORAL
  Filled 2014-03-04 (×4): qty 1

## 2014-03-04 MED ORDER — CLONIDINE HCL 0.1 MG/24HR TD PTWK
0.1000 mg | MEDICATED_PATCH | TRANSDERMAL | Status: DC
  Administered 2014-03-04: 0.1 mg via TRANSDERMAL
  Filled 2014-03-04: qty 1

## 2014-03-04 MED ORDER — INSULIN ASPART 100 UNIT/ML ~~LOC~~ SOLN
0.0000 [IU] | SUBCUTANEOUS | Status: DC
  Administered 2014-03-04 – 2014-03-05 (×6): 2 [IU] via SUBCUTANEOUS

## 2014-03-04 MED ORDER — METOPROLOL TARTRATE 25 MG/10 ML ORAL SUSPENSION
12.5000 mg | Freq: Two times a day (BID) | ORAL | Status: DC
  Filled 2014-03-04 (×4): qty 5

## 2014-03-04 MED ORDER — POTASSIUM CHLORIDE 10 MEQ/50ML IV SOLN
10.0000 meq | INTRAVENOUS | Status: AC
  Administered 2014-03-04 (×3): 10 meq via INTRAVENOUS
  Filled 2014-03-04 (×2): qty 50

## 2014-03-04 MED ORDER — DIPHENHYDRAMINE HCL 12.5 MG/5ML PO ELIX
12.5000 mg | ORAL_SOLUTION | Freq: Four times a day (QID) | ORAL | Status: DC | PRN
  Administered 2014-03-04 – 2014-03-05 (×2): 12.5 mg via ORAL
  Filled 2014-03-04 (×3): qty 5

## 2014-03-04 NOTE — Progress Notes (Signed)
Patient ID: Alexis Wheeler, female   DOB: Mar 21, 1955, 59 y.o.   MRN: [id number] EVENING ROUNDS NOTE :     [address]       [place], [address]             [phone]                 1 Day Post-Op Procedure(s) (LRB): REPAIR OF ASCENDING AORTIC DISSECTION (N/A)  Total Length of Stay:  LOS: 1 day  BP 102/55 mmHg  Pulse 72  Temp(Src) 98.1 F (36.7 C) (Oral)  Resp 15  Ht 5\' 4"  (1.626 m)  Wt 216 lb 4.3 oz (98.1 kg)  BMI 37.10 kg/m2  SpO2 100%  .Intake/Output      02/20 0701 - 02/21 0700 02/21 0701 - 02/22 0700   P.O.  740   I.V. (mL/kg) 3197.6 (32.6) 285 (2.9)   Blood 2813    NG/GT 90    IV Piggyback 1750 150   Total Intake(mL/kg) 7850.6 (80) 1175 (12)   Urine (mL/kg/hr) 1925 (0.8) 275 (0.2)   Emesis/NG output  0 (0)   Blood 1855 (0.8)    Chest Tube 270 (0.1) 70 (0.1)   Total Output 4050 345   Net +3800.6 +830        Emesis Occurrence  1 x     . sodium chloride 20 mL/hr at 03/04/14 0700  . sodium chloride    . sodium chloride 10 mL/hr at 03/04/14 0700  . dexmedetomidine Stopped (03/04/14 0000)  . insulin (NOVOLIN-R) infusion 1.7 Units/hr (03/04/14 0700)  . lactated ringers    . nitroGLYCERIN 50 mcg/min (03/04/14 1800)  . phenylephrine (NEO-SYNEPHRINE) Adult infusion       Lab Results  Component Value Date   WBC 15.2* 03/04/2014   HGB 8.2* 03/04/2014   HCT 24.0* 03/04/2014   PLT 69* 03/04/2014   GLUCOSE 134* 03/04/2014   CHOL 174 07/09/2011   TRIG 61.0 07/09/2011   HDL 52.00 07/09/2011   LDLCALC 110* 07/09/2011   ALT 20 03/02/2014   AST 31 03/02/2014   NA 137 03/04/2014   K 3.6 03/04/2014   CL 113* 03/04/2014   CREATININE 1.50* 03/04/2014   BUN 16 03/04/2014   CO2 27 03/04/2014   TSH 0.64 03/11/2012   INR 0.96 03/03/2014   Uop: 275 past shift Cr 1.5 Left foot warm with pulses   Grace Isaac MD  Beeper [phone] Office [phone] 03/04/2014 6:55 PM

## 2014-03-04 NOTE — Progress Notes (Signed)
Attempted to wean patient per rapid wean protocol.  Patient demonstrated ability to stick out tongue and lift head off pillow.  Patient was placed on 40% and RR of 4.  Patient was breathing well while stimulated, but RR dropped to 8-10 when left alone with Ve 4.1.  Previous vent settings were resumed, RN at bedside.

## 2014-03-04 NOTE — Progress Notes (Signed)
Anesthesiology Follow-up:  Extubated one hour ago, answering questions, following commands, moving all extremities. Minimal CT bleeding.  VS: T-37 BP- 128/48 HR 82 (SR) RR- 18 O2 Sat 95% on 4 L [place]  H/H- 8.5/25.8  K- 3.1 BUN/Cr 13/1.31 glucose 107  One day S/P repair of Type 1 ascending aortic dissection. Overall making good progress.  Alexis Wheeler

## 2014-03-04 NOTE — Procedures (Signed)
Extubation Procedure Note  Patient Details:   Name: Alexis Wheeler DOB: [birth year] MRN: [id number]   Airway Documentation:  Airway 7.5 mm (Active)  Secured at (cm) 21 cm 03/04/2014  4:31 AM  Measured From Lips 03/04/2014  4:31 AM  Secured Location Right 03/04/2014  4:31 AM  Secured By Other (Comment) 03/04/2014  4:31 AM  Cuff Pressure (cm H2O) 28 cm H2O 03/03/2014 11:17 AM  Site Condition Dry 03/03/2014  4:17 PM   NIF -60, VC 1.0L.  Good patient effort, good air leak prior to extubation.   Evaluation  O2 sats: stable throughout Complications: No apparent complications Patient did tolerate procedure well. Bilateral Breath Sounds: Clear, Diminished Suctioning: Oral, Airway Yes  Lacretia Nicks 03/04/2014, 5:56 AM

## 2014-03-04 NOTE — Progress Notes (Signed)
   VASCULAR SURGERY ASSESSMENT & PLAN:  * palpable dorsalis pedis pulses bilaterally.  *  Vascular surgery will be available as needed.  SUBJECTIVE: extubated and doing well.  PHYSICAL EXAM: Filed Vitals:   03/04/14 0615 03/04/14 0630 03/04/14 0645 03/04/14 0700  BP:    88/49  Pulse: 80 78 80 80  Temp: 98.6 F (37 C) 98.6 F (37 C) 98.6 F (37 C) 98.6 F (37 C)  TempSrc:      Resp: 19 19 18 14   Height:      Weight:      SpO2: 97% 99% 100% 100%   Palpable dorsalis pedis pulses bilaterally.  LABS: Lab Results  Component Value Date   WBC 11.4* 03/04/2014   HGB 8.5* 03/04/2014   HCT 25.8* 03/04/2014   MCV 71.5* 03/04/2014   PLT 75* 03/04/2014   Lab Results  Component Value Date   CREATININE 1.31* 03/04/2014   Lab Results  Component Value Date   INR 0.96 03/03/2014   CBG (last 3)   Recent Labs  03/03/14 2312 03/04/14 0011 03/04/14 0112  GLUCAP 100* 98 122*    Active Problems:   Aortic dissection  Gae Gallop Beeper: [phone] 03/04/2014

## 2014-03-04 NOTE — Addendum Note (Signed)
Addendum  created 03/04/14 0802 by Roberts Gaudy, MD   Modules edited: Notes Section   Notes Section:  File: [id number]; Pend: [phone]

## 2014-03-04 NOTE — Progress Notes (Signed)
Patient ID: Alexis Wheeler, female   DOB: 1955/12/17, 59 y.o.   MRN: [id number] TCTS DAILY ICU PROGRESS NOTE                   [address]            [place] [address]          [phone]   1 Day Post-Op Procedure(s) (LRB): REPAIR OF ASCENDING AORTIC DISSECTION (N/A)  Total Length of Stay:  LOS: 1 day   Subjective: Awake and alert , neuro intact  Objective: Vital signs in last 24 hours: Temp:  [93.9 F (34.4 C)-98.8 F (37.1 C)] 98.6 F (37 C) (02/21 0900) Pulse Rate:  [72-91] 82 (02/21 0900) Cardiac Rhythm:  [-] Normal sinus rhythm (02/21 0900) Resp:  [0-27] 17 (02/21 0900) BP: (81-119)/(46-81) 103/60 mmHg (02/21 0800) SpO2:  [95 %-100 %] 100 % (02/21 0900) FiO2 (%):  [40 %-50 %] 40 % (02/21 0500) Weight:  [207 lb 3.7 oz (94 kg)-216 lb 4.3 oz (98.1 kg)] 216 lb 4.3 oz (98.1 kg) (02/21 0300)  Filed Weights   03/03/14 1117 03/03/14 1315 03/04/14 0300  Weight: 207 lb 3.7 oz (94 kg) 207 lb 3.7 oz (94 kg) 216 lb 4.3 oz (98.1 kg)    Weight change: 5 lb 4.7 oz (2.4 kg)   Hemodynamic parameters for last 24 hours: PAP: (10-33)/(4-16) 30/16 mmHg CO:  [1.9 L/min-4.5 L/min] 4.5 L/min CI:  [1 L/min/m2-2.3 L/min/m2] 2.3 L/min/m2  Intake/Output from previous day: 02/20 0701 - 02/21 0700 In: 7850.6 [I.V.:3197.6; Blood:2813; NG/GT:90; IV Piggyback:1750] Out: 6440 [Urine:1925; Blood:1855; Chest Tube:270]  Intake/Output this shift: Total I/O In: 73.2 [I.V.:23.2; IV Piggyback:50] Out: 100 [Urine:50; Chest Tube:50]  Current Meds: Scheduled Meds: . acetaminophen  1,000 mg Oral 4 times per day   Or  . acetaminophen (TYLENOL) oral liquid 160 mg/5 mL  1,000 mg Per Tube 4 times per day  . antiseptic oral rinse  7 mL Mouth Rinse QID  . aspirin EC  325 mg Oral Daily   Or  . aspirin  324 mg Per Tube Daily  . bisacodyl  10 mg Oral Daily   Or  . bisacodyl  10 mg Rectal Daily  . cefUROXime (ZINACEF)  IV  1.5 g Intravenous Q12H  . chlorhexidine  15 mL Mouth  Rinse BID  . coagulation factor VIIa recomb  45 mcg/kg Intravenous Once  . docusate sodium  200 mg Oral Daily  . insulin regular  0-10 Units Intravenous TID WC  . metoprolol tartrate  12.5 mg Oral BID   Or  . metoprolol tartrate  12.5 mg Per Tube BID  . [START ON 03/05/2014] pantoprazole  40 mg Oral Daily  . potassium chloride  10 mEq Intravenous Q1 Hr x 3  . sodium chloride  3 mL Intravenous Q12H   Continuous Infusions: . sodium chloride 20 mL/hr at 03/04/14 0700  . sodium chloride    . sodium chloride 10 mL/hr at 03/04/14 0700  . dexmedetomidine Stopped (03/04/14 0000)  . DOPamine 2 mcg/kg/min (03/04/14 0800)  . insulin (NOVOLIN-R) infusion 1.7 Units/hr (03/04/14 0700)  . lactated ringers    . nitroGLYCERIN 15 mcg/min (03/04/14 0900)  . phenylephrine (NEO-SYNEPHRINE) Adult infusion     PRN Meds:.metoprolol, midazolam, morphine injection, ondansetron (ZOFRAN) IV, oxyCODONE, sodium chloride, traMADol  General appearance: alert, cooperative and no distress Neurologic: intact Heart: regular rate and rhythm, S1, S2 normal, no murmur, click, rub or gallop Lungs: diminished breath sounds  bibasilar Abdomen: soft, non-tender; bowel sounds normal; no masses,  no organomegaly Extremities: extremities normal, atraumatic, no cyanosis or edema and Homans sign is negative, no sign of DVT Wound: sternum intact Full pedal pulses dp and pt bilateral definite improvement from preop   Lab Results: CBC: Recent Labs  03/03/14 1700 03/03/14 1703 03/04/14 0400  WBC 8.6  --  11.4*  HGB 9.0* 9.2* 8.5*  HCT 27.4* 27.0* 25.8*  PLT 75*  --  75*   BMET:  Recent Labs  03/02/14 2041  03/03/14 1703 03/04/14 0400  NA 140  < > 143 144  K 3.3*  < > 3.6 3.1*  CL 102  < > 107 113*  CO2 27  --   --  27  GLUCOSE 124*  < > 120* 107*  BUN 12  < > 12 13  CREATININE 0.91  < > 1.20* 1.31*  CALCIUM 9.0  --   --  7.8*  < > = values in this interval not displayed.  PT/INR:  Recent Labs   03/03/14 1110  LABPROT 12.9  INR 0.96   Radiology:  Dg Chest Port 1 View  03/03/2014   CLINICAL DATA:  Status post repair of thoracic aorta for type A aortic dissection.  EXAM: PORTABLE CHEST - 1 VIEW  COMPARISON:  03/02/2014  FINDINGS: Postoperatively, endotracheal tube present with the tip approximately 1 cm above the carina. Swan-Ganz catheter extends into the proximal right pulmonary artery. Mediastinal drain present. No evidence of pneumothorax. Lung volumes are low bilaterally. No overt edema. Bibasilar atelectasis present. No significant pleural fluid.  IMPRESSION: No pneumothorax postoperatively. Low lung volumes with bibasilar atelectasis.   Electronically Signed   By: Aletta Edouard M.D.   On: 03/03/2014 11:57   Ct Angio Chest Aorta W/cm &/or Wo/cm  03/03/2014   CLINICAL DATA:  Pt presents with c/o midsternal chest pain onset 45 min ago while standing in kitchen at same time began having heaviness and numbness tingling to L arm and leg. Chest pain resolved on arrival  EXAM: CT ANGIOGRAPHY AOBIFEM WITHOUT AND WITH CONTRAST; CT ANGIOGRAPHY CHEST  TECHNIQUE: Initial CT imaging of the chest was performed without contrast. CT imaging was then acquired through the chest, abdomen and pelvis and continued through the lower extremities during and following the rapid infusion of intravenous contrast. Computer-generated coronal and sagittal MIP and MPR reconstructed images were also acquired.  CONTRAST:  164mL OMNIPAQUE IOHEXOL 350 MG/ML SOLN  COMPARISON:  Abdomen and pelvis CT, 11/04/2010  FINDINGS: Angiographic Exam:  There is a type A dissection of the thoracoabdominal aorta. The dissection begins at the aortic root extending across the aortic arch and into the proximal origin of the innominate artery. Dissection extends across the arch through the descending aorta, throughout the abdominal aorta and into the left common iliac artery. Flow to the innominate artery is primarily from the false channel.  Flow to the left common carotid and left subclavian arteries is from the false channel. Flow to the celiac axis, superior mesenteric artery and right renal artery is from the false channel. Flow to the left kidney is from the true lumen. The primary left renal artery is occluded just beyond its origin. A secondary branch to the anterior lower pole of the left kidney appears to arise at least in part from the false channel. This portion of the kidney shows normal enhancement. The remainder the kidney does not enhance consistent with infarction. Inferior mesenteric artery is patent arising from the false channel.  Thrombosis in the distal left common iliac artery causes severe stenosis of this vessel. There is a near complete occlusion at the origin of the left external iliac artery and again along the distal aspect of the external iliac artery. Vessels than reconstituted, with normal appearing flow seen in the common femoral artery. Right common iliac artery has its flow from false channel and is widely patent as are the external and internal iliac vessels on the right.  Vessels are widely patent to both lower extremities with no significant atherosclerotic plaque and no dissection.  Non Angiographic Exam:  Heart is mildly enlarged. Pulmonary arteries are normal in caliber. Ascending aorta measures 4 cm in diameter. No mediastinal or hilar masses or pathologically enlarged lymph nodes. Lungs show peripheral reticular opacities consistent with a combination of subsegmental atelectasis and interstitial scarring/thickening. No convincing pneumonia. No overt pulmonary edema. No pleural effusion.  Liver, spleen and pancreas are unremarkable. Gallbladder surgically absent. No bile duct dilation. 17 mm low-density left adrenal mass similar to the prior exam consistent with an adenoma. Normal right adrenal gland.  Right kidney shows normal enhancement. No mass or stone. 12 mm stone lies in the left renal pelvis. As mentioned  above, most of the left kidney shows abnormal decreased enhancement. This is consistent with infarction.  Bladder is unremarkable.  Uterus is surgically absent.  No pathologically enlarged lymph nodes. No abnormal fluid collections.  There are scattered colonic diverticula. No diverticulitis. Bowel otherwise unremarkable.  Review of the MIP images confirms the above findings.  IMPRESSION: 1. 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 Dissection begins at the aortic root. The ascending aorta is mildly dilated to 4 cm. Most of the aortic branch vessels arise from the false lumen, which shows more dense contrast enhancement. 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 2. Dissection extends through the abdominal aorta into the left common iliac artery. Distal left common iliac artery is severely narrowed. This extends into the external iliac artery on the left where there is additional severe narrowing to near occlusion along its distal aspect. The common femoral artery, however, is widely patent and well opacified, receiving accessory flow from collateral vessels. 3. No significant plaque or stenosis or dissection of the lower extremity vessels.   Electronically Signed   By: Lajean Manes M.D.   On: 03/03/2014 01:13   Acute Kidney Injury (any one)  Increase in SCr by > 0.3 within 48 hours  Increase SCr to > 1.5 times baseline  Urine volume < 0.5 ml/kg/h for 6 hrs  Stage:  Risk:   1.5x increase in creatinine or GFR decrease by 25% or UOP <0.15ml/kgperhr for 6 hrs  Injury:  2x increase in creatinine or GFR decrease by 50% or UOP < 0.55ml/kgperhr for 12 hr  Failure:3X increase in creatinine or GFR decrease by 75% or UOP < 0.64ml/kgperhr for 12 hr or                anuria 12 hrs  Loss: complete loss of kidney  function for more then  4 weeks  End-stage renal disease:Complete loss of kidney function for more then 3 months  Lab Results  Component Value Date   CREATININE 1.31* 03/04/2014   CREATININE: 1.31 mg/dL ABNORMAL (03/04/14 0400) Estimated creatinine clearance - 52.6 mL/min   Assessment/Plan: S/P Procedure(s) (LRB):  REPAIR OF ASCENDING AORTIC DISSECTION (N/A)/  Replacement of Ascending Aorta and re suspension of Aortic Valve with circulatory arrest Mobilize Diabetes control Continue foley due to strict I&O, patient in ICU and urinary output monitoring See progression orders  Expected Acute  Blood - loss Anemia Acute Kidney Injury- Risk Stage related to infarction of portion of left kidney due  to dissection-  Thrombocytopenia- avoid heparin Still slightly elevated bp, d/c dopamine   Alexis Wheeler 03/04/2014 9:05 AM

## 2014-03-05 ENCOUNTER — Inpatient Hospital Stay (HOSPITAL_COMMUNITY): Payer: BLUE CROSS/BLUE SHIELD

## 2014-03-05 ENCOUNTER — Encounter (HOSPITAL_COMMUNITY): Payer: Self-pay | Admitting: Cardiology

## 2014-03-05 DIAGNOSIS — E785 Hyperlipidemia, unspecified: Secondary | ICD-10-CM

## 2014-03-05 DIAGNOSIS — Z9889 Other specified postprocedural states: Secondary | ICD-10-CM

## 2014-03-05 DIAGNOSIS — I10 Essential (primary) hypertension: Secondary | ICD-10-CM

## 2014-03-05 LAB — BASIC METABOLIC PANEL
Anion gap: 3 — ABNORMAL LOW (ref 5–15)
BUN: 18 mg/dL (ref 6–23)
CO2: 27 mmol/L (ref 19–32)
Calcium: 7.8 mg/dL — ABNORMAL LOW (ref 8.4–10.5)
Chloride: 106 mmol/L (ref 96–112)
Creatinine, Ser: 1.63 mg/dL — ABNORMAL HIGH (ref 0.50–1.10)
GFR calc Af Amer: 39 mL/min — ABNORMAL LOW (ref >90)
GFR calc non Af Amer: 33 mL/min — ABNORMAL LOW (ref >90)
Glucose, Bld: 118 mg/dL — ABNORMAL HIGH (ref 70–99)
Potassium: 3.9 mmol/L (ref 3.5–5.1)
Sodium: 136 mmol/L (ref 135–145)

## 2014-03-05 LAB — GLUCOSE, CAPILLARY
Glucose-Capillary: 111 mg/dL — ABNORMAL HIGH (ref 70–99)
Glucose-Capillary: 118 mg/dL — ABNORMAL HIGH (ref 70–99)
Glucose-Capillary: 118 mg/dL — ABNORMAL HIGH (ref 70–99)
Glucose-Capillary: 149 mg/dL — ABNORMAL HIGH (ref 70–99)
Glucose-Capillary: 140 mg/dL — ABNORMAL HIGH (ref 70–99)
Glucose-Capillary: 125 mg/dL — ABNORMAL HIGH (ref 70–99)

## 2014-03-05 LAB — CBC
HCT: 25.2 % — ABNORMAL LOW (ref 36.0–46.0)
Hemoglobin: 8.1 g/dL — ABNORMAL LOW (ref 12.0–15.0)
MCH: 23.6 pg — ABNORMAL LOW (ref 26.0–34.0)
MCHC: 32.1 g/dL (ref 30.0–36.0)
MCV: 73.5 fL — ABNORMAL LOW (ref 78.0–100.0)
Platelets: 70 10*3/uL — ABNORMAL LOW (ref 150–400)
RBC: 3.43 MIL/uL — ABNORMAL LOW (ref 3.87–5.11)
RDW: 17.2 % — ABNORMAL HIGH (ref 11.5–15.5)
WBC: 14.7 10*3/uL — ABNORMAL HIGH (ref 4.0–10.5)

## 2014-03-05 MED ORDER — METOPROLOL TARTRATE 50 MG PO TABS
50.0000 mg | ORAL_TABLET | Freq: Two times a day (BID) | ORAL | Status: DC
  Administered 2014-03-05 – 2014-03-06 (×4): 50 mg via ORAL
  Filled 2014-03-05 (×7): qty 1

## 2014-03-05 MED ORDER — NITROGLYCERIN 0.2 MG/HR TD PT24
0.2000 mg | MEDICATED_PATCH | Freq: Every day | TRANSDERMAL | Status: DC
  Administered 2014-03-05 – 2014-03-06 (×2): 0.2 mg via TRANSDERMAL
  Filled 2014-03-05 (×2): qty 1

## 2014-03-05 MED ORDER — CLONIDINE HCL 0.2 MG/24HR TD PTWK: 0.2000 mg | MEDICATED_PATCH | TRANSDERMAL | Status: DC

## 2014-03-05 MED ORDER — ATORVASTATIN CALCIUM 40 MG PO TABS
40.0000 mg | ORAL_TABLET | Freq: Every day | ORAL | Status: DC
  Administered 2014-03-05 – 2014-03-13 (×9): 40 mg via ORAL
  Filled 2014-03-05 (×10): qty 1

## 2014-03-05 MED ORDER — METOPROLOL TARTRATE 25 MG/10 ML ORAL SUSPENSION
50.0000 mg | Freq: Two times a day (BID) | ORAL | Status: DC
  Filled 2014-03-05 (×6): qty 20

## 2014-03-05 MED FILL — Electrolyte-R (PH 7.4) Solution: INTRAVENOUS | Qty: 7000 | Status: AC

## 2014-03-05 MED FILL — Heparin Sodium (Porcine) Inj 1000 Unit/ML: INTRAMUSCULAR | Qty: 30 | Status: AC

## 2014-03-05 MED FILL — Mannitol IV Soln 20%: INTRAVENOUS | Qty: 500 | Status: AC

## 2014-03-05 MED FILL — Lidocaine HCl IV Inj 20 MG/ML: INTRAVENOUS | Qty: 5 | Status: AC

## 2014-03-05 MED FILL — Sodium Chloride IV Soln 0.9%: INTRAVENOUS | Qty: 4000 | Status: AC

## 2014-03-05 MED FILL — Potassium Chloride Inj 2 mEq/ML: INTRAVENOUS | Qty: 40 | Status: AC

## 2014-03-05 MED FILL — Sodium Bicarbonate IV Soln 8.4%: INTRAVENOUS | Qty: 50 | Status: AC

## 2014-03-05 MED FILL — Heparin Sodium (Porcine) Inj 1000 Unit/ML: INTRAMUSCULAR | Qty: 10 | Status: AC

## 2014-03-05 MED FILL — Magnesium Sulfate Inj 50%: INTRAMUSCULAR | Qty: 10 | Status: AC

## 2014-03-05 NOTE — Op Note (Signed)
NAME:  Alexis Wheeler, Alexis Wheeler NO.:  [id number]  MEDICAL RECORD NO.:  [id number]  LOCATION:  2S08C                        FACILITY:  [facility]  PHYSICIAN:  Lanelle Bal, MD    DATE OF BIRTH:  1955-04-01  DATE OF PROCEDURE:  03/02/2014 DATE OF DISCHARGE:                              OPERATIVE REPORT   PREOPERATIVE DIAGNOSIS:  Acute type 1 aortic dissection with aortic insufficiency.  POSTOPERATIVE DIAGNOSIS:  Acute type 1 aortic dissection with aortic insufficiency.  SURGICAL PROCEDURES: 1. Replacement of ascending aorta. 2. Resuspension of aortic valve  3. Right axillary artery cannulation and hypothermic circulatory circulatory arrest.  SURGEON:  Lanelle Bal, MD.  FIRST ASSISTANT:  Suzzanne Cloud, P.A.  BRIEF HISTORY:  The patient is a 59 year old female with history of longstanding hypertension, who on the evening prior to surgery presented to the [facility] Emergency Room with left leg numbness,  weakness, and chest pain.  A CTA of the chest, abdomen, and pelvis revealed a type 1 aortic dissection starting just above the aortic valve and extending into the left iliac artery with probable loss of the left main renal artery, accessory lower renal artery was still patent.  The right renal was patent.  The patient was urgently transferred to [facility] for surgical repair.  Risks and options were discussed with the patient and she was agreeable and signed informed consent.  DESCRIPTION OF PROCEDURE:  The patient was brought to the operating room urgently, underwent general endotracheal anesthesia by Dr. Tamala Julian.  A TEE probe was placed, which confirmed aortic dissection, moderate to severe aortic insufficiency.  The skin of the chest, legs, and abdomen were prepped with Betadine and draped in sterile manner.  Appropriate timeout was performed.  We first proceeded with exposure of the right axillary artery, which was encircled with a loop.  With this, ready for  aortic cannulation.  We then proceeded with a median sternotomy.  The pericardium was opened.  As noted on TEE and the CT, the patient had obvious dissection of the ascending aorta.  The aorta itself was not particularly enlarged, roughly around 4 cm at its greatest dimension. We then heparinized the patient.  The axillary artery was clamped, opened, and an 8 mm Terumo cannula with the Gore-Tex graft was anastomosed to the axillary artery and connected to the arterial inflow. We then placed a retrograde cardioplegia catheter and a dual-stage venous drainage into the right atrium.  The patient was placed on cardiopulmonary bypass at 2.4 L per minute per meter squared.  Right superior pulmonary vent was placed.  The patient's body temperature was then cooled to a nasal temperature of 18 degrees.  Innominate artery had been isolated.  We then started the circulatory arrest after administration of steroids.  A clamp was placed on the proximal innominate artery and antegrade cerebral perfusion was commenced.  The aorta was opened.  There was flow returning from the left carotid artery.  On examination of the aorta, the initial rent was noted just at the top of the commissure of the aortic valve over the noncoronary cusp. The dissection plane did not involve the takeoff of the right or left coronary artery.  We then proceeded with removing  the dissected ascending aorta.  Sized a 30 mm woven Dacron graft.  A BioGlue was used to reapproximate the dissected edges of the distal aorta for the anastomosis.  30 mL balloon was placed into the lumen of the aorta to hold the glued flaps together.  This worked satisfactorily.  We then used a Felt strip and anastomosed the graft to the aorta just at the takeoff of the innominate artery with a running 3-0 Prolene suture.  As this anastomosis was completed, BioGlue was placed on the anastomosis. We then slowly for re-instigated full cardiopulmonary bypass  de-airing the arching graft.  Cross-clamp was placed on the aortic graft in full. Cardiopulmonary bypass was resumed and the patient's body temperature was started to be rewarmed to 28 degrees.  The antegrade cerebral perfusion time was 28 minutes.  Total arrest time was a total of 30 minutes.  We then proceeded to dissect the remaining of the aorta identifying the aortic root.  The aorta was trimmed to just slightly above the coronary cusps.  The commissure between the left and noncoronary cusps was re- suspended.  This gave a competent appearing valve.  We then in a similar fashion performed the proximal anastomosis over Felt strips and in a similar fashion to the distal anastomosis, the area of dissection was reapproximated with BioGlue and then the anastomosis were performed over a strip of Felt a running Prolene suture.  Intermittently during this retrograde cardioplegia was administered, the heart was allowed to passively fill as we completed the anastomosis with de-aired the ascending aorta.  As noted, the dissection did not involve the right or left coronary ostium in both, each were confirmed to be intact.  An aortic root vent cardioplegia needle was introduced into the graft for further de-airing the heart.  Aortic cross-clamp was removed with a total cross-clamp time of 64 minutes.  The patient required electric defibrillation to return to a sinus rhythm.  Her body temperature was then rewarmed to 37 degrees.  Atrial and ventricular pacing wires were applied.  She was then ventilated and weaned from cardiopulmonary bypass without difficulty.  TEE showed good functioning of the aortic valve without evidence of aortic insufficiency.  Right superior pulmonary vein vent had been removed.  The patient remained hemodynamically stable. The venous lines were removed.  The arterial cannulations graft in the right axillary artery was stapled.  Protamine sulfate was administered because  of low fibrinogen.  Cryoprecipitate was administered, 2 units of fresh frozen platelets.  The patient initially had began forming clot, but then started having increasing oozing consistent with coagulopathy. We then administered NovoSeven 45 mg/kg.  With administration of this, the field became much more hemostatic to a satisfactory level.  Two Blake mediastinal drains were left in place.  The pericardium was loosely reapproximated over the ascending aorta and the sternum was closed with #6 stainless steel wire.  Fascia was closed with interrupted 0 Vicryl, running 2-0 Vicryl in subcutaneous tissue, 3-0 subcuticular stitch in skin edges.  Dry dressings were applied.  Sponge and needle count was reported as correct at the completion of the procedure.  The patient tolerated the procedure without obvious complication and was transferred to the Surgical Intensive Care Unit.  At the completion of the procedure before moving the patient from the operating room, Dr. Doren Custard who had been consulted concerning the profusion of her left leg came and consulted.  At this point, we had considered if flow was not restored to the left leg to  a satisfactory degree, then a femoral- femoral bypass would be considered.  At the completion of procedure, the patient had palpable pedal pulses in both feet, so no further surgical procedure was performed and the patient was transferred to the Surgical Intensive Care Unit for further postoperative care.  Total pump time was 206 minutes.     Lanelle Bal, MD     EG/MEDQ  D:  03/05/2014  T:  03/05/2014  Job:  [id number]

## 2014-03-05 NOTE — Consult Note (Signed)
CARDIOLOGY CONSULTATION NOTE.  NAME:  Alexis Wheeler   MRN: [id number] DOB:  10-05-1955   ADMIT DATE: 03/02/2014  Reason for Consult: s/p Aortic Dissection   Requesting Physician: Dr. Servando Snare  Primary Cardiologist: N/A  HPI: This is a 59 y.o. female with a past medical history significant for long-standing difficult to control hypertension since her 39s as well as a family history of early-onset aortic dissection with her mother having dissection in her 31s. She is morbidly obese with hypertension and hyperlipidemia. She has been somewhat intolerant of higher doses of blood pressure medications due to some orthostatic dizziness. She was otherwise in her usual state of health till the evening of February 19. She then presented with acute onset anterior chest pain radiating down to the epigastrium with associated numbness of the left leg. It began about 8:00 in the evening. He did not have any associated dyspnea or arm discomfort. No jaw discomfort. Pain was rated as moderate to severe. She was evaluated with a CT scan which demonstrated type a aortic dissection extending down to the left iliac artery and excluding the left renal artery.  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  Past Medical History  Diagnosis Date  . Morbid obesity   . HEMATURIA UNSPECIFIED   . GERD   . MYOSITIS   . HYPERTENSION   . HYPERLIPIDEMIA   . Vertigo    Past Surgical History  Procedure Laterality Date  . Cholecystectomy  2001  . Abdominal hysterectomy  1983    FAMHx: Family History  Problem Relation Age of Onset  . Breast cancer Mother 81  . Breast cancer Sister   . Throat cancer Brother     1 bro living  . Seizures Neg Hx   . Aortic dissection Mother 34  . Hypertension Mother   . Hypertension Father   . Hypertension Sister   . Hypertension  Daughter     SOCHx:  reports that she has never smoked. She does not have any smokeless tobacco history on file. She reports that she does not drink alcohol or use illicit drugs.  ALLERGIES: Allergies  Allergen Reactions  . Ibuprofen     REACTION: Upset GI   ROS:A comprehensive review of systems was performed Review of Systems  Constitutional: Negative for weight loss and malaise/fatigue.  HENT: Negative for nosebleeds.   Eyes: Negative for blurred vision and double vision.  Cardiovascular: Positive for chest pain.       Symptom leading to admission - associated with leg numbness and weakness  Gastrointestinal: Negative for blood in stool and melena.  Genitourinary: Negative for hematuria and flank pain.  Musculoskeletal: Negative.   Neurological: Positive for headaches. Negative for dizziness, tingling, speech change, focal weakness, seizures and loss of consciousness.  Endo/Heme/Allergies: Negative.   Psychiatric/Behavioral: Negative.   All other systems reviewed and are negative.  HOME MEDICATIONS: Prescriptions prior to admission  Medication Sig Dispense Refill Last Dose  . amLODipine (NORVASC) 10 MG tablet Take 1 tablet (10 mg total) by mouth daily. 90 tablet 3 03/02/2014 at Unknown time  . amLODipine (NORVASC) 5 MG tablet   4   . Ascorbic Acid (VITAMIN C) 500 MG tablet Take 500 mg by mouth daily.     Past Month at Unknown time  . Cholecalciferol (VITAMIN D3) 1000 UNITS CAPS Take 1,000 Units by mouth  daily.     Past Month at Unknown time  . triamterene-hydrochlorothiazide (MAXZIDE-25) 37.5-25 MG per tablet Take 1 tablet by mouth daily before breakfast.   03/02/2014 at Unknown time  . Acetaminophen (PAIN RELIEF EXTRA STRENGTH PO) Take 2 tablets by mouth daily as needed (pain).   unknown at unknown  . amLODipine (NORVASC) 5 MG tablet TAKE ONE TABLET BY MOUTH ONCE DAILY (Patient not taking: Reported on 03/02/2014) 90 tablet 3   . gabapentin (NEURONTIN) 100 MG capsule Take 1  capsule (100 mg total) by mouth at bedtime. (Patient not taking: Reported on 03/02/2014) 90 capsule 3   . loratadine (CLARITIN) 10 MG tablet Take 1 tablet (10 mg total) by mouth daily. (Patient not taking: Reported on 03/02/2014) 30 tablet 2 Taking  . naproxen (NAPROSYN) 500 MG tablet Take 1 tablet (500 mg total) by mouth 2 (two) times daily with a meal. Start AFTER you are done with Predsnisone 60 tablet 0 Taking  . traMADol (ULTRAM) 50 MG tablet Take 1 tablet (50 mg total) by mouth every 8 (eight) hours as needed. 30 tablet 0 Taking   HOSPITAL MEDICATIONS: Scheduled Meds: . acetaminophen  1,000 mg Oral 4 times per day   Or  . acetaminophen (TYLENOL) oral liquid 160 mg/5 mL  1,000 mg Per Tube 4 times per day  . aspirin EC  81 mg Oral Daily   Or  . aspirin  81 mg Per Tube Daily  . bisacodyl  10 mg Oral Daily   Or  . bisacodyl  10 mg Rectal Daily  . [START ON 03/11/2014] cloNIDine  0.2 mg Transdermal Weekly  . coagulation factor VIIa recomb  45 mcg/kg Intravenous Once  . docusate sodium  200 mg Oral Daily  . insulin aspart  0-24 Units Subcutaneous 6 times per day  . insulin regular  0-10 Units Intravenous TID WC  . metoprolol tartrate  50 mg Oral BID   Or  . metoprolol tartrate  50 mg Per Tube BID  . nitroGLYCERIN  0.2 mg Transdermal Daily  . pantoprazole  40 mg Oral Daily  . sodium chloride  3 mL Intravenous Q12H   Continuous Infusions: . sodium chloride 20 mL/hr at 03/04/14 0700  . sodium chloride    . insulin (NOVOLIN-R) infusion 1.7 Units/hr (03/04/14 0700)  . lactated ringers    . nitroGLYCERIN Stopped (03/05/14 1000)  . phenylephrine (NEO-SYNEPHRINE) Adult infusion     PRN Meds:.diphenhydrAMINE, metoprolol, morphine injection, ondansetron (ZOFRAN) IV, oxyCODONE, sodium chloride, traMADol VITALS: Blood pressure 92/68, pulse 76, temperature 98.2 F (36.8 C), temperature source Oral, resp. rate 23, height 5\' 4"  (1.626 m), weight 220 lb 14.4 oz (100.2 kg), SpO2 92 %.  PHYSICAL  EXAM: General appearance: alert, cooperative, appears stated age, no distress, morbidly obese and Recently received morphine and is somewhat sleepy Neck: no adenopathy, no carotid bruit, no JVD and Right IJ central line in place Lungs: clear to auscultation bilaterally, normal percussion bilaterally and Nonlabored, good air movement Heart: normal apical impulse, regular rate and rhythm, S1, S2 normal, no S3 or S4, systolic murmur: systolic ejection 1/6, crescendo and decrescendo at 2nd left intercostal space, at 2nd right intercostal space and Somewhat distant heart sounds due to body habitus Abdomen: soft, non-tender; bowel sounds normal; no masses,  no organomegaly Extremities: extremities normal, atraumatic, no cyanosis or edema Pulses: 2+ and symmetric Skin: Skin color, texture, turgor normal. No rashes or lesions Neurologic: Mental status: Alert, oriented, thought content appropriate Cranial nerves: normal  LABS: Results  for orders placed or performed during the hospital encounter of 03/02/14 (from the past 24 hour(s))  Glucose, capillary     Status: Abnormal   Collection Time: 03/05/14  3:59 AM  Result Value Ref Range   Glucose-Capillary 111 (H) 70 - 99 mg/dL   Comment 1 Capillary Specimen    Comment 2 Notify RN    Comment 3 Document in Chart   Basic metabolic panel     Status: Abnormal   Collection Time: 03/05/14  4:00 AM  Result Value Ref Range   Sodium 136 135 - 145 mmol/L   Potassium 3.9 3.5 - 5.1 mmol/L   Chloride 106 96 - 112 mmol/L   CO2 27 19 - 32 mmol/L   Glucose, Bld 118 (H) 70 - 99 mg/dL   BUN 18 6 - 23 mg/dL   Creatinine, Ser 1.63 (H) 0.50 - 1.10 mg/dL   Calcium 7.8 (L) 8.4 - 10.5 mg/dL   GFR calc non Af Amer 33 (L) >90 mL/min   GFR calc Af Amer 39 (L) >90 mL/min   Anion gap 3 (L) 5 - 15  CBC     Status: Abnormal   Collection Time: 03/05/14  4:00 AM  Result Value Ref Range   WBC 14.7 (H) 4.0 - 10.5 K/uL   RBC 3.43 (L) 3.87 - 5.11 MIL/uL   Hemoglobin 8.1 (L)  12.0 - 15.0 g/dL   HCT 25.2 (L) 36.0 - 46.0 %   MCV 73.5 (L) 78.0 - 100.0 fL   MCH 23.6 (L) 26.0 - 34.0 pg   MCHC 32.1 30.0 - 36.0 g/dL   RDW 17.2 (H) 11.5 - 15.5 %   Platelets 70 (L) 150 - 400 K/uL  Glucose, capillary     Status: Abnormal   Collection Time: 03/05/14  8:11 AM  Result Value Ref Range   Glucose-Capillary 118 (H) 70 - 99 mg/dL   EKG: QTc interval on EKG from February 20 was 560 -  EKG from 03/04/2014:  NSR, rate 83, LVH with mildly full changes. Nonspecific ST and T-wave changes. QTC is 470 (normal)  TELEMETRY: Mostly sinus rhythm with occasional PACs.  IMAGING: Dg Chest Port 1 View  03/04/2014   CLINICAL DATA:  Postop aortic dissection  EXAM: PORTABLE CHEST - 1 VIEW  COMPARISON:  03/03/2014  FINDINGS: Endotracheal tube and nasogastric tube have been removed. Right jugular Gordy Councilman catheter remains. Mediastinal drain and left base chest tube remain. No pneumothorax is evident. Moderate left base consolidation persists. There has been partial clearance of perihilar airspace opacity on the left. There also has been clearance of medial right base airspace opacity  IMPRESSION: Improved, with partial clearance of airspace opacities. No pneumothorax. ET and NG tubes have been removed.   Electronically Signed   By: Andreas Newport M.D.   On: 03/04/2014 06:50    IMPRESSION: Principal Problem:   S/P aortic dissection repair Active Problems:   Hyperlipidemia with target LDL less than 70   Morbid obesity   Essential hypertension - labile, long-standing   RECOMMENDATION: Morbidly obese 59 year old woman following her mother's footsteps with an aortic dissection and long-standing hypertension. She has reported hyperlipidemia, but I don't have reported lipids since 2013 that were relatively well-controlled. Apparently at home she is only on amlodipine and Maxzide for blood pressure. It may be was started on Catapres prior to admission. She does occasionally miss some  doses.  Family her blood pressure seems relatively well-controlled if anything she is mildly hypotensive. Mitral it has  been stopped. She is on Catapres patch and Lopressor twice a day. 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  Recommendations would be to evaluate her cardiac function and left ventricular size as well as the aortic valve with a mild echocardiogram. His not endorsing any active cardiac symptoms but did note feeling some tightness or chest with walking that would probably more related to postop symptoms. I would not be surprised to find that she has some LVH and hypertensive cardiomyopathy changes -- main treatment for this would be continued blood pressure control which is appropriate with her dissection.  Existence of aortic disease would suggest possible existence of coronary artery disease. Would like to see how she does symptomatically on better blood pressure control but would have low threshold to evaluate for CAD in the outpatient setting.  She does not appear to have had any significant arrhythmias besides PACs. Would monitor QT interval, but most recent EKG demonstrated that the QTC was back to normal. Monitor electrolyte levels keeping potassium greater than 4 and calcium greater than 2. Magnesium therapeutic level.  We will follow along for after the echocardiogram. We'll be happy to see her in follow-up as an outpatient once stable. Would start statin for endothelial stabilization.   Time Spent Directly with Patient: 30 minutes, 25 minutes with chart  HARDING, Leonie Green, M.D., M.S. Interventional Cardiologist   Pager # [phone]

## 2014-03-05 NOTE — Progress Notes (Signed)
[facility] BRIEF SICU PROGRESS NOTE  2 Days Post-Op  S/P Procedure(s) (LRB): REPAIR OF ASCENDING AORTIC DISSECTION (N/A)   Stable day NSR w/ stable BP Ambulated short distance w/ assistance, but patient weak PT consult ordered - remains pending  Plan: Continue current plan  Colonel Krauser H 03/05/2014 7:58 PM

## 2014-03-05 NOTE — Progress Notes (Addendum)
TCTS DAILY ICU PROGRESS NOTE                   Alexis Wheeler.[address]            [place] [address]          [phone]   2 Days Post-Op Procedure(s) (LRB): REPAIR OF ASCENDING AORTIC DISSECTION (N/A)  Total Length of Stay:  LOS: 2 days   Subjective: Patient denies nausea this am. No specific complaints  Objective: Vital signs in last 24 hours: Temp:  [97.9 F (36.6 C)-98.8 F (37.1 C)] 98.2 F (36.8 C) (02/22 0411) Pulse Rate:  [66-123] 70 (02/22 0700) Cardiac Rhythm:  [-] Normal sinus rhythm (02/22 0600) Resp:  [0-25] 11 (02/22 0700) BP: (91-125)/(49-71) 121/71 mmHg (02/22 0700) SpO2:  [92 %-100 %] 99 % (02/22 0700) Arterial Line BP: (117-154)/(51-69) 146/66 mmHg (02/22 0700) Weight:  [220 lb 14.4 oz (100.2 kg)] 220 lb 14.4 oz (100.2 kg) (02/22 0630)  Filed Weights   03/03/14 1315 03/04/14 0300 03/05/14 0630  Weight: 207 lb 3.7 oz (94 kg) 216 lb 4.3 oz (98.1 kg) 220 lb 14.4 oz (100.2 kg)    Weight change: 13 lb 10.7 oz (6.2 kg)   Hemodynamic parameters for last 24 hours: PAP: (27-33)/(10-18) 33/18 mmHg CO:  [4.5 L/min] 4.5 L/min CI:  [2.3 L/min/m2] 2.3 L/min/m2  Intake/Output from previous day: 02/21 0701 - 02/22 0700 In: 1897 [P.O.:1220; I.V.:477; IV Piggyback:200] Out: 625 [Urine:555; Chest Tube:70]  Intake/Output this shift:    Current Meds: Scheduled Meds: . acetaminophen  1,000 mg Oral 4 times per day   Or  . acetaminophen (TYLENOL) oral liquid 160 mg/5 mL  1,000 mg Per Tube 4 times per day  . aspirin EC  81 mg Oral Daily   Or  . aspirin  81 mg Per Tube Daily  . bisacodyl  10 mg Oral Daily   Or  . bisacodyl  10 mg Rectal Daily  . cloNIDine  0.1 mg Transdermal Q Sun  . coagulation factor VIIa recomb  45 mcg/kg Intravenous Once  . docusate sodium  200 mg Oral Daily  . insulin aspart  0-24 Units Subcutaneous 6 times per day  . insulin regular  0-10 Units Intravenous TID WC  . metoprolol tartrate  25 mg Oral BID   Or  . metoprolol  tartrate  12.5 mg Per Tube BID  . pantoprazole  40 mg Oral Daily  . sodium chloride  3 mL Intravenous Q12H   Continuous Infusions: . sodium chloride 20 mL/hr at 03/04/14 0700  . sodium chloride    . insulin (NOVOLIN-R) infusion 1.7 Units/hr (03/04/14 0700)  . lactated ringers    . nitroGLYCERIN 20 mcg/min (03/05/14 0700)  . phenylephrine (NEO-SYNEPHRINE) Adult infusion     PRN Meds:.diphenhydrAMINE, metoprolol, morphine injection, ondansetron (ZOFRAN) IV, oxyCODONE, sodium chloride, traMADol  General appearance: alert, cooperative and no distress Neurologic: intact Heart: RRR Lungs: Diminished left base;right lung is mostly clear Abdomen: soft, non-tender; bowel sounds normal; no masses,  no organomegaly Extremities: Trace LE edema;DP/PT intact;feet warm Wound: Aquacel in place  Lab Results: CBC: Recent Labs  03/04/14 1630 03/04/14 1635 03/05/14 0400  WBC 15.2*  --  14.7*  HGB 8.1* 8.2* 8.1*  HCT 25.1* 24.0* 25.2*  PLT 69*  --  70*   BMET:  Recent Labs  03/04/14 0400  03/04/14 1635 03/05/14 0400  NA 144  --  137 136  K 3.1*  --  3.6 3.9  CL  113*  --  113* 106  CO2 27  --   --  27  GLUCOSE 107*  --  134* 118*  BUN 13  --  16 18  CREATININE 1.31*  < > 1.50* 1.63*  CALCIUM 7.8*  --   --  7.8*  < > = values in this interval not displayed.  PT/INR:  Recent Labs  03/03/14 1110  LABPROT 12.9  INR 0.96   Radiology: Dg Chest Port 1 View  03/04/2014   CLINICAL DATA:  Postop aortic dissection  EXAM: PORTABLE CHEST - 1 VIEW  COMPARISON:  03/03/2014  FINDINGS: Endotracheal tube and nasogastric tube have been removed. Right jugular Gordy Councilman catheter remains. Mediastinal drain and left base chest tube remain. No pneumothorax is evident. Moderate left base consolidation persists. There has been partial clearance of perihilar airspace opacity on the left. There also has been clearance of medial right base airspace opacity  IMPRESSION: Improved, with partial clearance of  airspace opacities. No pneumothorax. ET and NG tubes have been removed.   Electronically Signed   By: Andreas Newport M.D.   On: 03/04/2014 06:50     Assessment/Plan: S/P Procedure(s) (LRB): REPAIR OF ASCENDING AORTIC DISSECTION (N/A)  1. CV-SR in the 70-80's. On Nitro drip, Lopressor 25 bid, and Catapres patch 0.1 td. Will increase Lopressor to 50 bid, Catapres patch, and add nitro patch. Wean off Nitro and remove a line 2. Pulmonary-On 2 liters of oxygen via Magnet. CXR appears to show low lung volumes, left base atelectasis, cardiomegaly. Encourage incentive spirometer 3. ABL anemia- H and H stable at 8.1 and 25.2 4. Thrombocytopenia-platelets remain at 70,000. Not on heparin or heparin like products 5. DM-CBGs 135/108/111. 6. AKI- Creatinine increased from 1.5 to 1.63. Per Dr. Servando Snare, risk stage related to infarction of a portion of left kidney due to ATA dissection. Foley to remain today. 7. PT consult 8. Will order am labs  ZIMMERMAN,DONIELLE M PA-C 03/05/2014 7:37 AM  Weaning off NTG drip, increased catapres and beta blocker I have seen and examined Alexis Wheeler and agree with the above assessment  and plan.  Grace Isaac MD Beeper [phone] Office [phone] 03/05/2014 2:13 PM

## 2014-03-06 DIAGNOSIS — I7101 Dissection of thoracic aorta: Secondary | ICD-10-CM

## 2014-03-06 LAB — GLUCOSE, CAPILLARY
Glucose-Capillary: 94 mg/dL (ref 70–99)
Glucose-Capillary: 88 mg/dL (ref 70–99)
Glucose-Capillary: 113 mg/dL — ABNORMAL HIGH (ref 70–99)
Glucose-Capillary: 113 mg/dL — ABNORMAL HIGH (ref 70–99)
Glucose-Capillary: 119 mg/dL — ABNORMAL HIGH (ref 70–99)

## 2014-03-06 LAB — CBC
HCT: 23.6 % — ABNORMAL LOW (ref 36.0–46.0)
Hemoglobin: 7.5 g/dL — ABNORMAL LOW (ref 12.0–15.0)
MCH: 23.6 pg — ABNORMAL LOW (ref 26.0–34.0)
MCHC: 31.8 g/dL (ref 30.0–36.0)
MCV: 74.2 fL — ABNORMAL LOW (ref 78.0–100.0)
Platelets: 75 10*3/uL — ABNORMAL LOW (ref 150–400)
RBC: 3.18 MIL/uL — ABNORMAL LOW (ref 3.87–5.11)
RDW: 17.1 % — ABNORMAL HIGH (ref 11.5–15.5)
WBC: 11.3 10*3/uL — ABNORMAL HIGH (ref 4.0–10.5)

## 2014-03-06 LAB — BASIC METABOLIC PANEL
Anion gap: 4 — ABNORMAL LOW (ref 5–15)
BUN: 21 mg/dL (ref 6–23)
CO2: 30 mmol/L (ref 19–32)
Calcium: 8 mg/dL — ABNORMAL LOW (ref 8.4–10.5)
Chloride: 104 mmol/L (ref 96–112)
Creatinine, Ser: 1.47 mg/dL — ABNORMAL HIGH (ref 0.50–1.10)
GFR calc Af Amer: 44 mL/min — ABNORMAL LOW (ref >90)
GFR calc non Af Amer: 38 mL/min — ABNORMAL LOW (ref >90)
Glucose, Bld: 99 mg/dL (ref 70–99)
Potassium: 3.7 mmol/L (ref 3.5–5.1)
Sodium: 138 mmol/L (ref 135–145)

## 2014-03-06 LAB — LIPID PANEL
Cholesterol: 87 mg/dL (ref 0–200)
HDL: 27 mg/dL — ABNORMAL LOW (ref >39)
LDL Cholesterol: 38 mg/dL (ref 0–99)
Total CHOL/HDL Ratio: 3.2 RATIO
Triglycerides: 108 mg/dL (ref <150)
VLDL: 22 mg/dL (ref 0–40)

## 2014-03-06 MED ORDER — POTASSIUM CHLORIDE 10 MEQ/50ML IV SOLN
INTRAVENOUS | Status: AC
  Filled 2014-03-06: qty 50

## 2014-03-06 MED ORDER — POTASSIUM CHLORIDE 10 MEQ/50ML IV SOLN
10.0000 meq | INTRAVENOUS | Status: AC | PRN
  Administered 2014-03-06 (×3): 10 meq via INTRAVENOUS
  Filled 2014-03-06: qty 50

## 2014-03-06 MED ORDER — FERROUS SULFATE 325 (65 FE) MG PO TABS
325.0000 mg | ORAL_TABLET | Freq: Every day | ORAL | Status: DC
  Administered 2014-03-06 – 2014-03-14 (×9): 325 mg via ORAL
  Filled 2014-03-06 (×11): qty 1

## 2014-03-06 MED ORDER — FOLIC ACID 1 MG PO TABS
1.0000 mg | ORAL_TABLET | Freq: Every day | ORAL | Status: DC
  Administered 2014-03-06 – 2014-03-14 (×9): 1 mg via ORAL
  Filled 2014-03-06 (×10): qty 1

## 2014-03-06 MED ORDER — PERFLUTREN LIPID MICROSPHERE
1.0000 mL | INTRAVENOUS | Status: AC | PRN
  Administered 2014-03-06: 2 mL via INTRAVENOUS
  Filled 2014-03-06: qty 10

## 2014-03-06 MED ORDER — AMLODIPINE BESYLATE 10 MG PO TABS
10.0000 mg | ORAL_TABLET | Freq: Every day | ORAL | Status: DC
  Administered 2014-03-06 – 2014-03-14 (×9): 10 mg via ORAL
  Filled 2014-03-06 (×10): qty 1

## 2014-03-06 MED ORDER — FUROSEMIDE 10 MG/ML IJ SOLN
40.0000 mg | Freq: Once | INTRAMUSCULAR | Status: AC
  Administered 2014-03-06: 40 mg via INTRAVENOUS
  Filled 2014-03-06: qty 4

## 2014-03-06 NOTE — Evaluation (Signed)
Physical Therapy Evaluation Patient Details Name: Alexis Wheeler MRN: [id number] DOB: Dec 15, 1955 Today's Date: 03/06/2014   History of Present Illness  Patien transferred to [facility] with acute Type 1 aortic dissection. She presented complaining of f anterior chest pain radiating to the epigastrium accompanied by numbness of left leg which started at 8 PM 2/19. S/p repair 2/22  Clinical Impression  Pt very pleasant but with decreased motivation and desire to mobilize who benefits from reassurance and encouragement. Pt works as a Quarry manager at [employer] and plans to return to work once able to lift and pull. Pt with below deficits who will benefit from acute therapy to maximize mobility, function, gait and adherence to precautions. Pt encouraged to walk 3x/day. Will follow    Follow Up Recommendations Home health PT    Equipment Recommendations  Rolling walker with 5" wheels    Recommendations for Other Services OT consult     Precautions / Restrictions Precautions Precautions: Sternal      Mobility  Bed Mobility               General bed mobility comments: in chair on arrival  Transfers Overall transfer level: Needs assistance   Transfers: Sit to/from Stand Sit to Stand: Min assist         General transfer comment: cues for hand placement, seqeunce and assist for anterior translation and full elevation from surface  Ambulation/Gait Ambulation/Gait assistance: Min guard Ambulation Distance (Feet): 120 Feet Assistive device: Rolling walker (2 wheeled) Gait Pattern/deviations: Step-through pattern;Decreased stride length   Gait velocity interpretation: Below normal speed for age/gender General Gait Details: cues for posture and position in RW  Stairs            Wheelchair Mobility    Modified Rankin (Stroke Patients Only)       Balance Overall balance assessment: Needs assistance   Sitting balance-Leahy Scale: Good       Standing balance-Leahy  Scale: Fair                               Pertinent Vitals/Pain Pain Assessment: No/denies pain  BP 147/69 HR 75 sats 95% on RA    Home Living Family/patient expects to be discharged to:: Private residence Living Arrangements: Non-relatives/Friends Available Help at Discharge: Friend(s);Available 24 hours/day Type of Home: House Home Access: Level entry     Home Layout: One level Home Equipment: None      Prior Function Level of Independence: Independent               Hand Dominance        Extremity/Trunk Assessment   Upper Extremity Assessment: Overall WFL for tasks assessed           Lower Extremity Assessment: Generalized weakness      Cervical / Trunk Assessment: Normal  Communication   Communication: No difficulties  Cognition Arousal/Alertness: Awake/alert Behavior During Therapy: Flat affect Overall Cognitive Status: Within Functional Limits for tasks assessed                      General Comments      Exercises        Assessment/Plan    PT Assessment Patient needs continued PT services  PT Diagnosis Difficulty walking;Generalized weakness   PT Problem List Decreased strength;Decreased activity tolerance;Decreased balance;Decreased knowledge of use of DME;Decreased knowledge of precautions;Decreased mobility;Obesity  PT Treatment Interventions Gait training;DME instruction;Functional mobility training;Therapeutic  activities;Patient/family education   PT Goals (Current goals can be found in the Care Plan section) Acute Rehab PT Goals Patient Stated Goal: sing at church PT Goal Formulation: With patient Time For Goal Achievement: 03/20/14 Potential to Achieve Goals: Good    Frequency Min 3X/week   Barriers to discharge   pt reports [facility]ennants can provide 24hr care    Co-evaluation               End of Session   Activity Tolerance: Patient tolerated treatment well Patient left: in chair;with  nursing/sitter in room;with call bell/phone within reach Nurse Communication: Mobility status;Precautions         Time: 6546-5035 PT Time Calculation (min) (ACUTE ONLY): 19 min   Charges:   PT Evaluation $Initial PT Evaluation Tier I: 1 Procedure     PT G CodesMelford Aase 03/06/2014, 7:58 AM  Elwyn Reach, [place]

## 2014-03-06 NOTE — Progress Notes (Signed)
POD # 3 repair ascending aorta  Resting comfortably  BP 129/73 mmHg  Pulse 71  Temp(Src) 98.4 F (36.9 C) (Oral)  Resp 12  Ht 5\' 4"  (1.626 m)  Wt 220 lb 14.4 oz (100.2 kg)  BMI 37.90 kg/m2  SpO2 95%   Intake/Output Summary (Last 24 hours) at 03/06/14 1828 Last data filed at 03/06/14 1800  Gross per 24 hour  Intake 1497.8 ml  Output   1650 ml  Net -152.2 ml   Continue current care

## 2014-03-06 NOTE — Progress Notes (Signed)
  Echocardiogram 2D Echocardiogram has been performed.  Alexis Wheeler 03/06/2014, 12:49 PM

## 2014-03-06 NOTE — Progress Notes (Addendum)
TCTS DAILY ICU PROGRESS NOTE                   [address]            [place] [address]          [phone]   3 Days Post-Op Procedure(s) (LRB): REPAIR OF ASCENDING AORTIC DISSECTION (N/A)  Total Length of Stay:  LOS: 3 days   Subjective: Patient just finished walking with PT. Is getting washed up now. She is very tired;otherwise, no complaints.  Objective: Vital signs in last 24 hours: Temp:  [98 F (36.7 C)-98.5 F (36.9 C)] 98.3 F (36.8 C) (02/23 0400) Pulse Rate:  [65-78] 73 (02/23 0700) Cardiac Rhythm:  [-] Normal sinus rhythm (02/23 0300) Resp:  [11-23] 21 (02/23 0700) BP: (92-143)/(46-73) 114/65 mmHg (02/23 0600) SpO2:  [89 %-100 %] 96 % (02/23 0700) Arterial Line BP: (137-144)/(51-63) 144/51 mmHg (02/22 0830) Weight:  [220 lb 14.4 oz (100.2 kg)] 220 lb 14.4 oz (100.2 kg) (02/23 0700)  Filed Weights   03/04/14 0300 03/05/14 0630 03/06/14 0700  Weight: 216 lb 4.3 oz (98.1 kg) 220 lb 14.4 oz (100.2 kg) 220 lb 14.4 oz (100.2 kg)    Weight change: 0 lb (0 kg)      Intake/Output from previous day: 02/22 0701 - 02/23 0700 In: 1712.8 [P.O.:500; I.V.:1212.8] Out: 855 [Urine:855]  Intake/Output this shift: Total I/O In: 50 [IV Piggyback:50] Out: -   Current Meds: Scheduled Meds: . acetaminophen  1,000 mg Oral 4 times per day   Or  . acetaminophen (TYLENOL) oral liquid 160 mg/5 mL  1,000 mg Per Tube 4 times per day  . aspirin EC  81 mg Oral Daily   Or  . aspirin  81 mg Per Tube Daily  . atorvastatin  40 mg Oral q1800  . bisacodyl  10 mg Oral Daily   Or  . bisacodyl  10 mg Rectal Daily  . [START ON 03/11/2014] cloNIDine  0.2 mg Transdermal Weekly  . coagulation factor VIIa recomb  45 mcg/kg Intravenous Once  . docusate sodium  200 mg Oral Daily  . insulin aspart  0-24 Units Subcutaneous 6 times per day  . metoprolol tartrate  50 mg Oral BID   Or  . metoprolol tartrate  50 mg Per Tube BID  . nitroGLYCERIN  0.2 mg Transdermal Daily  .  pantoprazole  40 mg Oral Daily  . potassium chloride      . sodium chloride  3 mL Intravenous Q12H   Continuous Infusions: . sodium chloride Stopped (03/05/14 1900)  . sodium chloride 250 mL (03/05/14 2000)  . lactated ringers    . nitroGLYCERIN Stopped (03/05/14 1000)  . phenylephrine (NEO-SYNEPHRINE) Adult infusion     PRN Meds:.diphenhydrAMINE, metoprolol, morphine injection, ondansetron (ZOFRAN) IV, oxyCODONE, potassium chloride, sodium chloride, traMADol  General appearance: alert, cooperative and no distress Neurologic: intact Heart: RRR Lungs: Diminished left base;right lung is mostly clear Abdomen: soft, non-tender; bowel sounds normal; no masses,  no organomegaly Extremities: Trace LE edema;DP/PT intact;feet warm Wound: Aquacel in place  Lab Results: CBC:  Recent Labs  03/05/14 0400 03/06/14 0512  WBC 14.7* 11.3*  HGB 8.1* 7.5*  HCT 25.2* 23.6*  PLT 70* 75*   BMET:   Recent Labs  03/05/14 0400 03/06/14 0512  NA 136 138  K 3.9 3.7  CL 106 104  CO2 27 30  GLUCOSE 118* 99  BUN 18 21  CREATININE 1.63* 1.47*  CALCIUM 7.8*  8.0*    PT/INR:   Recent Labs  03/03/14 1110  LABPROT 12.9  INR 0.96   Radiology: Dg Chest Port 1 View  03/05/2014   CLINICAL DATA:  59 year old female status post repair of aortic dissection. *INSERT* additional  EXAM: PORTABLE CHEST - 1 VIEW  COMPARISON:  03/04/2014 and earlier.  FINDINGS: Portable AP upright view at 0611 hours. Right IJ approach Swan-Ganz catheter removed, right IJ introducer sheath remains. Left chest tube removed. Residual probably epicardial pacer wires projecting over the lower mediastinum. Sequelae of median sternotomy. Subxiphoid skin staples.  Lower lung volumes with confluent retrocardiac opacity. Stable mild pulmonary vascular congestion. Trace pleural fluid now on the right minor fissure. No pneumothorax. Stable cardiac size and mediastinal contours.  IMPRESSION: 1. Left chest tube and right IJ Swan-Ganz  catheter removed. Right IJ introducer sheath remains. 2. No pneumothorax. No overt edema. Left greater than right pleural effusions with lower lobe collapse/consolidation, greater on the left.   Electronically Signed   By: Genevie Ann M.D.   On: 03/05/2014 08:03     Assessment/Plan: S/P Procedure(s) (LRB): REPAIR OF ASCENDING AORTIC DISSECTION (N/A)  1. CV-SR in the 70-80's.On Lopressor to 50 bid, Catapres patch 0.2, and Nitro patch 0.2. Must have strict control of BP. Cardiology evaluated yesterday. Await echo. 2. Pulmonary-On 1 liter of oxygen via [place]. Will order CXR for today.Encourage incentive spirometer 3. ABL anemia- H and H stable at 7.5 and 23.6. Start folic acid and iron 4. Thrombocytopenia-platelets slightly increased to 75,000. Not on heparin or heparin like products 5. CBGs 140/125/94. Will check HGA1C. No prior history of diabetes. 6. 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  7. Deconditioning-PT working with patient this am 8. Supplement potassium-being given 3 runs 9. Likely remove foley 10. Volume overload-weight is about 9 pounds above pre op weight. Will discuss diuresis with Dr. Servando Snare as creatinine decreasing. [place] to transfer to [place] soon  Arnoldo Lenis 03/06/2014 7:36 AM   Will add lasix today D/c foley Pt has seen this am I have seen and examined [name] and agree with the above assessment  and plan.  Grace Isaac MD Beeper [phone] Office [phone] 03/06/2014 8:52 AM

## 2014-03-06 NOTE — Progress Notes (Signed)
Patient Name: Alexis Wheeler Date of Encounter: 03/06/2014     Principal Problem:   S/P aortic dissection repair Active Problems:   Morbid obesity   Essential hypertension - labile, long-standing   Hyperlipidemia with target LDL less than 70    SUBJECTIVE  She is sleepy at present. No CP or SOB. Felt weak after walk with PT this a.m.    CURRENT MEDS . acetaminophen  1,000 mg Oral 4 times per day   Or  . acetaminophen (TYLENOL) oral liquid 160 mg/5 mL  1,000 mg Per Tube 4 times per day  . aspirin EC  81 mg Oral Daily   Or  . aspirin  81 mg Per Tube Daily  . atorvastatin  40 mg Oral q1800  . bisacodyl  10 mg Oral Daily   Or  . bisacodyl  10 mg Rectal Daily  . [START ON 03/11/2014] cloNIDine  0.2 mg Transdermal Weekly  . coagulation factor VIIa recomb  45 mcg/kg Intravenous Once  . docusate sodium  200 mg Oral Daily  . ferrous sulfate  325 mg Oral Q breakfast  . folic acid  1 mg Oral Daily  . insulin aspart  0-24 Units Subcutaneous 6 times per day  . metoprolol tartrate  50 mg Oral BID   Or  . metoprolol tartrate  50 mg Per Tube BID  . nitroGLYCERIN  0.2 mg Transdermal Daily  . pantoprazole  40 mg Oral Daily  . potassium chloride      . sodium chloride  3 mL Intravenous Q12H    OBJECTIVE  Filed Vitals:   03/06/14 0800 03/06/14 0900 03/06/14 1000 03/06/14 1100  BP: 150/72 138/66 140/63   Pulse: 73 79 75 79  Temp: 98.7 F (37.1 C)     TempSrc: Oral     Resp: 18 18 18 22   Height:      Weight:      SpO2: 93% 93% 92% 93%    Intake/Output Summary (Last 24 hours) at 03/06/14 1229 Last data filed at 03/06/14 1200  Gross per 24 hour  Intake 1547.8 ml  Output   1165 ml  Net  382.8 ml   Filed Weights   03/04/14 0300 03/05/14 0630 03/06/14 0700  Weight: 216 lb 4.3 oz (98.1 kg) 220 lb 14.4 oz (100.2 kg) 220 lb 14.4 oz (100.2 kg)    PHYSICAL EXAM  General: Pleasant, NAD. Neuro: Alert and oriented X 3. Moves all extremities spontaneously. Psych: Normal  affect. HEENT:  Normal  Neck: Supple without bruits or JVD. Lungs:  Diminished bases, Resp regular and unlabored, diminished breath sounds bilat. Heart: RRR no s3, s4, or murmurs. Abdomen: Soft, non-tender, non-distended, BS + x 4.  Extremities: No clubbing, cyanosis or edema. DP/PT/Radials 2+ and equal bilaterally.  Accessory Clinical Findings  CBC  Recent Labs  03/05/14 0400 03/06/14 0512  WBC 14.7* 11.3*  HGB 8.1* 7.5*  HCT 25.2* 23.6*  MCV 73.5* 74.2*  PLT 70* 75*   Basic Metabolic Panel  Recent Labs  03/04/14 0400 03/04/14 1630  03/05/14 0400 03/06/14 0512  NA 144  --   < > 136 138  K 3.1*  --   < > 3.9 3.7  CL 113*  --   < > 106 104  CO2 27  --   --  27 30  GLUCOSE 107*  --   < > 118* 99  BUN 13  --   < > 18 21  CREATININE 1.31* 1.60*  < >  1.63* 1.47*  CALCIUM 7.8*  --   --  7.8* 8.0*  MG 2.7* 2.6*  --   --   --   < > = values in this interval not displayed.  Fasting Lipid Panel  Recent Labs  03/06/14 0512  CHOL 87  HDL 27*  LDLCALC 38  TRIG 108  CHOLHDL 3.2   TELE  SR, 70's.   Radiology/Studies  Dg Chest Port 1 View  03/05/2014   CLINICAL DATA:  59 year old female status post repair of aortic dissection. *INSERT* additional  EXAM: PORTABLE CHEST - 1 VIEW  IMPRESSION: 1. Left chest tube and right IJ Swan-Ganz catheter removed. Right IJ introducer sheath remains. 2. No pneumothorax. No overt edema. Left greater than right pleural effusions with lower lobe collapse/consolidation, greater on the left.   Electronically Signed   By: Genevie Ann M.D.   On: 03/05/2014 08:03   Ct Angio Chest Aorta W/cm &/or Wo/cm  03/03/2014   CLINICAL DATA:  Pt presents with c/o midsternal chest pain onset 45 min ago while standing in kitchen at same time began having heaviness and numbness tingling to L arm and leg. Chest pain resolved on arrival  EXAM: CT ANGIOGRAPHY AOBIFEM WITHOUT AND WITH CONTRAST; CT ANGIOGRAPHY CHEST  IMPRESSION: 1. 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 Dissection begins at the aortic root. The ascending aorta is mildly dilated to 4 cm. Most of the aortic branch vessels arise from the false lumen, which shows more dense contrast enhancement. 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 2. Dissection extends through the abdominal aorta into the left common iliac artery. Distal left common iliac artery is severely narrowed. This extends into the external iliac artery on the left where there is additional severe narrowing to near occlusion along its distal aspect. The common femoral artery, however, is widely patent and well opacified, receiving accessory flow from collateral vessels. 3. No significant plaque or stenosis or dissection of the lower extremity vessels.   Electronically Signed   By: Lajean Manes M.D.   On: 03/03/2014 01:13    ASSESSMENT AND PLAN  1. 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  Recovering well.  Echo done this AM.  Mgmt per surgery.  2.  Essential Hypertension:  Pressures stable - trending in 130's to 140's this AM.  She is currently on clonidine patch 0.2 mg weekly, lopressor 50 bid, and ntg patch.  She was prev on amlodipine and maxzide.  Maxzide on hold in setting of mild renal insufficiency.  Cont lopressor.  Resume amlodipine 10 mg.  D/C ntg patch and clonidine patch.  Cont to follow bp closely.  F/U echo (just done).  3.  ABL Anemia:  Per surgery.  4.  AKI:  In setting of prob left renal infarction.  Follow.  Signed, Murray Hodgkins NP  I have personally seen and examined this patient with Ignacia Bayley, NP.  I agree with the assessment and plan as outlined above. Will stop clonidine patch and ntg patch and resume Norvasc. Continue beta blocker. Will titrate beta blocker as  needed for BP control. Echo is pending.   Shaw Dobek 03/06/2014 1:42 PM

## 2014-03-07 ENCOUNTER — Inpatient Hospital Stay (HOSPITAL_COMMUNITY): Payer: BLUE CROSS/BLUE SHIELD

## 2014-03-07 LAB — TYPE AND SCREEN
ABO/RH(D): A POS
Antibody Screen: NEGATIVE
Unit division: 0
Unit division: 0
Unit division: 0
Unit division: 0
Unit division: 0
Unit division: 0
Unit division: 0
Unit division: 0

## 2014-03-07 LAB — CBC
HCT: 23.1 % — ABNORMAL LOW (ref 36.0–46.0)
Hemoglobin: 7.5 g/dL — ABNORMAL LOW (ref 12.0–15.0)
MCH: 24 pg — ABNORMAL LOW (ref 26.0–34.0)
MCHC: 32.5 g/dL (ref 30.0–36.0)
MCV: 73.8 fL — ABNORMAL LOW (ref 78.0–100.0)
Platelets: 116 10*3/uL — ABNORMAL LOW (ref 150–400)
RBC: 3.13 MIL/uL — ABNORMAL LOW (ref 3.87–5.11)
RDW: 16.9 % — ABNORMAL HIGH (ref 11.5–15.5)
WBC: 10.6 10*3/uL — ABNORMAL HIGH (ref 4.0–10.5)

## 2014-03-07 LAB — BASIC METABOLIC PANEL
Anion gap: 7 (ref 5–15)
BUN: 18 mg/dL (ref 6–23)
CO2: 27 mmol/L (ref 19–32)
Calcium: 7.7 mg/dL — ABNORMAL LOW (ref 8.4–10.5)
Chloride: 103 mmol/L (ref 96–112)
Creatinine, Ser: 1.13 mg/dL — ABNORMAL HIGH (ref 0.50–1.10)
GFR calc Af Amer: 60 mL/min — ABNORMAL LOW (ref >90)
GFR calc non Af Amer: 52 mL/min — ABNORMAL LOW (ref >90)
Glucose, Bld: 106 mg/dL — ABNORMAL HIGH (ref 70–99)
Potassium: 3.7 mmol/L (ref 3.5–5.1)
Sodium: 137 mmol/L (ref 135–145)

## 2014-03-07 LAB — GLUCOSE, CAPILLARY
Glucose-Capillary: 118 mg/dL — ABNORMAL HIGH (ref 70–99)
Glucose-Capillary: 96 mg/dL (ref 70–99)
Glucose-Capillary: 113 mg/dL — ABNORMAL HIGH (ref 70–99)
Glucose-Capillary: 115 mg/dL — ABNORMAL HIGH (ref 70–99)
Glucose-Capillary: 101 mg/dL — ABNORMAL HIGH (ref 70–99)

## 2014-03-07 MED ORDER — ONDANSETRON HCL 4 MG PO TABS: 4.0000 mg | ORAL_TABLET | Freq: Four times a day (QID) | ORAL | Status: DC | PRN

## 2014-03-07 MED ORDER — PANTOPRAZOLE SODIUM 40 MG PO TBEC
40.0000 mg | DELAYED_RELEASE_TABLET | Freq: Every day | ORAL | Status: DC
  Administered 2014-03-07 – 2014-03-14 (×8): 40 mg via ORAL
  Filled 2014-03-07 (×8): qty 1

## 2014-03-07 MED ORDER — SODIUM CHLORIDE 0.9 % IJ SOLN: 3.0000 mL | INTRAMUSCULAR | Status: DC | PRN

## 2014-03-07 MED ORDER — METOPROLOL TARTRATE 25 MG PO TABS
75.0000 mg | ORAL_TABLET | Freq: Two times a day (BID) | ORAL | Status: DC
  Administered 2014-03-07 – 2014-03-10 (×7): 75 mg via ORAL
  Filled 2014-03-07 (×8): qty 1

## 2014-03-07 MED ORDER — ONDANSETRON HCL 4 MG/2ML IJ SOLN: 4.0000 mg | Freq: Four times a day (QID) | INTRAMUSCULAR | Status: DC | PRN

## 2014-03-07 MED ORDER — BISACODYL 10 MG RE SUPP: 10.0000 mg | Freq: Every day | RECTAL | Status: DC | PRN

## 2014-03-07 MED ORDER — SODIUM CHLORIDE 0.9 % IJ SOLN
3.0000 mL | Freq: Two times a day (BID) | INTRAMUSCULAR | Status: DC
  Administered 2014-03-07 – 2014-03-13 (×11): 3 mL via INTRAVENOUS

## 2014-03-07 MED ORDER — METOPROLOL TARTRATE 50 MG PO TABS
50.0000 mg | ORAL_TABLET | Freq: Two times a day (BID) | ORAL | Status: DC
  Filled 2014-03-07 (×2): qty 1

## 2014-03-07 MED ORDER — INSULIN ASPART 100 UNIT/ML ~~LOC~~ SOLN: 0.0000 [IU] | Freq: Three times a day (TID) | SUBCUTANEOUS | Status: DC

## 2014-03-07 MED ORDER — TRAMADOL HCL 50 MG PO TABS
50.0000 mg | ORAL_TABLET | Freq: Four times a day (QID) | ORAL | Status: DC | PRN
  Administered 2014-03-07 – 2014-03-14 (×18): 50 mg via ORAL
  Filled 2014-03-07 (×19): qty 1

## 2014-03-07 MED ORDER — SODIUM CHLORIDE 0.9 % IV SOLN: 250.0000 mL | INTRAVENOUS | Status: DC | PRN

## 2014-03-07 MED ORDER — POTASSIUM CHLORIDE 10 MEQ/50ML IV SOLN
10.0000 meq | INTRAVENOUS | Status: DC
  Administered 2014-03-07: 10 meq via INTRAVENOUS
  Filled 2014-03-07: qty 50

## 2014-03-07 MED ORDER — ASPIRIN EC 81 MG PO TBEC
81.0000 mg | DELAYED_RELEASE_TABLET | Freq: Every day | ORAL | Status: DC
  Administered 2014-03-07 – 2014-03-14 (×8): 81 mg via ORAL
  Filled 2014-03-07 (×8): qty 1

## 2014-03-07 MED ORDER — POTASSIUM CHLORIDE 10 MEQ/50ML IV SOLN
INTRAVENOUS | Status: AC
  Filled 2014-03-07: qty 50

## 2014-03-07 MED ORDER — BISACODYL 5 MG PO TBEC
10.0000 mg | DELAYED_RELEASE_TABLET | Freq: Every day | ORAL | Status: DC | PRN
  Administered 2014-03-07: 10 mg via ORAL
  Filled 2014-03-07: qty 2

## 2014-03-07 MED ORDER — DOCUSATE SODIUM 100 MG PO CAPS
200.0000 mg | ORAL_CAPSULE | Freq: Every day | ORAL | Status: DC
  Administered 2014-03-07: 200 mg via ORAL
  Filled 2014-03-07 (×3): qty 2

## 2014-03-07 MED ORDER — POTASSIUM CHLORIDE 10 MEQ/50ML IV SOLN
10.0000 meq | INTRAVENOUS | Status: AC
  Administered 2014-03-07 (×2): 10 meq via INTRAVENOUS
  Filled 2014-03-07: qty 50

## 2014-03-07 MED ORDER — MOVING RIGHT ALONG BOOK
Freq: Once | Status: AC
  Administered 2014-03-07: 08:00:00
  Filled 2014-03-07: qty 1

## 2014-03-07 NOTE — Progress Notes (Signed)
Patient ID: Alexis Wheeler, female   DOB: 1956/01/04, 59 y.o.   MRN: [id number] TCTS DAILY ICU PROGRESS NOTE                   [address]            [place] [address]          [phone]   4 Days Post-Op Procedure(s) (LRB): REPAIR OF ASCENDING AORTIC DISSECTION (N/A)  Total Length of Stay:  LOS: 4 days   Subjective: Awake and neuro intact, no abdominal pain  Objective: Vital signs in last 24 hours: Temp:  [98.2 F (36.8 C)-98.7 F (37.1 C)] 98.2 F (36.8 C) (02/24 0400) Pulse Rate:  [66-79] 73 (02/24 0700) Cardiac Rhythm:  [-] Normal sinus rhythm (02/24 0400) Resp:  [8-31] 12 (02/24 0700) BP: (109-150)/(58-81) 138/66 mmHg (02/24 0700) SpO2:  [89 %-95 %] 94 % (02/24 0700) Weight:  [218 lb 4.1 oz (99 kg)] 218 lb 4.1 oz (99 kg) (02/24 0500)  Filed Weights   03/05/14 0630 03/06/14 0700 03/07/14 0500  Weight: 220 lb 14.4 oz (100.2 kg) 220 lb 14.4 oz (100.2 kg) 218 lb 4.1 oz (99 kg)    Weight change: -2 lb 10.3 oz (-1.2 kg)   Hemodynamic parameters for last 24 hours:    Intake/Output from previous day: 02/23 0701 - 02/24 0700 In: 790 [P.O.:450; I.V.:290; IV Piggyback:50] Out: 1277 [Urine:1276; Stool:1]  Intake/Output this shift:    Current Meds: Scheduled Meds: . acetaminophen  1,000 mg Oral 4 times per day   Or  . acetaminophen (TYLENOL) oral liquid 160 mg/5 mL  1,000 mg Per Tube 4 times per day  . amLODipine  10 mg Oral Daily  . aspirin EC  81 mg Oral Daily   Or  . aspirin  81 mg Per Tube Daily  . atorvastatin  40 mg Oral q1800  . bisacodyl  10 mg Oral Daily   Or  . bisacodyl  10 mg Rectal Daily  . coagulation factor VIIa recomb  45 mcg/kg Intravenous Once  . docusate sodium  200 mg Oral Daily  . ferrous sulfate  325 mg Oral Q breakfast  . folic acid  1 mg Oral Daily  . insulin aspart  0-24 Units Subcutaneous 6 times per day  . metoprolol tartrate  50 mg Oral BID   Or  . metoprolol tartrate  50 mg Per Tube BID  . pantoprazole  40  mg Oral Daily  . potassium chloride  10 mEq Intravenous Q1 Hr x 3  . sodium chloride  3 mL Intravenous Q12H   Continuous Infusions: . sodium chloride Stopped (03/05/14 1900)  . sodium chloride 250 mL (03/05/14 2000)  . lactated ringers    . nitroGLYCERIN Stopped (03/05/14 1000)  . phenylephrine (NEO-SYNEPHRINE) Adult infusion     PRN Meds:.diphenhydrAMINE, metoprolol, morphine injection, ondansetron (ZOFRAN) IV, sodium chloride, traMADol  General appearance: alert, cooperative and no distress Neurologic: intact Heart: regular rate and rhythm, S1, S2 normal, no murmur, click, rub or gallop Lungs: diminished breath sounds LLL Abdomen: soft, non-tender; bowel sounds normal; no masses,  no organomegaly Extremities: extremities normal, atraumatic, no cyanosis or edema and Homans sign is negative, no sign of DVT Wound: sternum intact, rt axillary artery site intact  Lab Results: CBC: Recent Labs  03/06/14 0512 03/07/14 0420  WBC 11.3* 10.6*  HGB 7.5* 7.5*  HCT 23.6* 23.1*  PLT 75* 116*   BMET:  Recent Labs  03/06/14 [id number]  03/07/14 0420  NA 138 137  K 3.7 3.7  CL 104 103  CO2 30 27  GLUCOSE 99 106*  BUN 21 18  CREATININE 1.47* 1.13*  CALCIUM 8.0* 7.7*    PT/INR: No results for input(s): LABPROT, INR in the last 72 hours. Radiology: No results found.   Assessment/Plan: S/P Procedure(s) (LRB): REPAIR OF ASCENDING AORTIC DISSECTION (N/A) Mobilize Diuresis Plan for transfer to step-down: see transfer orders Improving renal function     Kelso Bibby B 03/07/2014 7:10 AM

## 2014-03-07 NOTE — Care Management Note (Signed)
    Page 1 of 2   03/13/2014     4:52:37 PM CARE MANAGEMENT NOTE 03/13/2014  Patient:  Alexis Wheeler, Alexis Wheeler   Account Number:  [id number]  Date Initiated:  03/05/2014  Documentation initiated by:  MAYO,HENRIETTA  Subjective/Objective Assessment:   s/p aortic dissection repair; lives with SO    PCP  [name]     Action/Plan:   Anticipated DC Date:  03/13/2014   Anticipated DC Plan:  [place]  CM consult      [facility] Choice  HOME HEALTH   Choice offered to / List presented to:  C-5 Sibling   DME arranged  Vassie Moselle      DME agency  [place] arranged  [place]   Status of service:  In process, will continue to follow Medicare Important Message given?   (If response is "NO", the following Medicare IM given date fields will be blank) Date Medicare IM given:   Medicare IM given by:   Date Additional Medicare IM given:   Additional Medicare IM given by:    Discharge Disposition:  [place]  Per UR Regulation:  Reviewed for med. necessity/level of care/duration of stay  If discussed at [place] of Stay Meetings, dates discussed:   03/13/2014    Comments:  03/13/14- 1645- Marvetta Gibbons RN, BSN [phone] Order for [place] added to [facility] services- call made to [facility] with [place] Go to add to referral- per [facility] pt already has 3n1 and RW and does not need any further DME.  03/06/14 [place] PT recommends home therapy.  Provided list of agencies to sister, referral made to [facility] per request.  Assisted family with FMLA and ST disability paperwork.  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.  Family very supportive - dtr states she will move in with pt to provide assistance @ time of discharge.

## 2014-03-07 NOTE — Progress Notes (Signed)
SUBJECTIVE:Chest wall sore. No SOB.   BP 138/66 mmHg  Pulse 73  Temp(Src) 98 F (36.7 C) (Oral)  Resp 12  Ht 5\' 4"  (1.626 m)  Wt 218 lb 4.1 oz (99 kg)  BMI 37.45 kg/m2  SpO2 94%  Intake/Output Summary (Last 24 hours) at 03/07/14 0851 Last data filed at 03/07/14 0400  Gross per 24 hour  Intake    720 ml  Output   1127 ml  Net   -407 ml    PHYSICAL EXAM General: Well developed, well nourished, in no acute distress. Alert and oriented x 3.  Psych:  Good affect, responds appropriately Neck: No JVD. No masses noted.  Lungs: Clear bilaterally with no wheezes or rhonci noted.  Heart: RRR with no murmurs noted. Abdomen: Bowel sounds are present. Soft, non-tender.  Extremities: No lower extremity edema.   LABS: Basic Metabolic Panel:  Recent Labs  03/04/14 1630  03/06/14 0512 03/07/14 0420  NA  --   < > 138 137  K  --   < > 3.7 3.7  CL  --   < > 104 103  CO2  --   < > 30 27  GLUCOSE  --   < > 99 106*  BUN  --   < > 21 18  CREATININE 1.60*  < > 1.47* 1.13*  CALCIUM  --   < > 8.0* 7.7*  MG 2.6*  --   --   --   < > = values in this interval not displayed. CBC:  Recent Labs  03/06/14 0512 03/07/14 0420  WBC 11.3* 10.6*  HGB 7.5* 7.5*  HCT 23.6* 23.1*  MCV 74.2* 73.8*  PLT 75* 116*   Fasting Lipid Panel:  Recent Labs  03/06/14 0512  CHOL 87  HDL 27*  LDLCALC 38  TRIG 108  CHOLHDL 3.2   Current Meds: . amLODipine  10 mg Oral Daily  . aspirin EC  81 mg Oral Daily  . atorvastatin  40 mg Oral q1800  . docusate sodium  200 mg Oral Daily  . ferrous sulfate  325 mg Oral Q breakfast  . folic acid  1 mg Oral Daily  . insulin aspart  0-24 Units Subcutaneous TID AC & HS  . metoprolol tartrate  50 mg Oral BID  . pantoprazole  40 mg Oral QAC breakfast  . potassium chloride  10 mEq Intravenous Q1 Hr x 2  . sodium chloride  3 mL Intravenous Q12H   Echo 03/06/14:  Left ventricle: The cavity size was normal. Wall thickness was normal. Systolic function  was mildly to moderately reduced. The estimated ejection fraction was in the range of 40% to 45%. Moderate hypokinesis of the basalinferolateral myocardium. - Aortic valve: There was trivial regurgitation. Valve area (VTI): 2.42 cm^2. Valve area (Vmax): 2.19 cm^2. Valve area (Vmean): 2.08 cm^2. - Pulmonary arteries: Systolic pressure was moderately increased. PA peak pressure: 46 mm Hg (S).  ASSESSMENT AND PLAN:  1. 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 Recovering well. Mgmt per surgery.  2. Essential Hypertension: BP stable with range 672-094 systolic. She is now on Norvasc 10 mg daily and Lopressor 50 mg po BID. She was on Maxzide at home but this has been on hold with renal insufficiency. Will increase lopressor to 75 mg po BID today.   3. Mild LV systolic dysfunction: BSJG=28-36%. Will manage medically for now. Will add Ace-inh or ARB to her  regimen if renal function is stable tomorrow.    Alexis Wheeler,Alexis Wheeler  2/24/20168:51 AM

## 2014-03-08 ENCOUNTER — Inpatient Hospital Stay (HOSPITAL_COMMUNITY): Payer: BLUE CROSS/BLUE SHIELD

## 2014-03-08 LAB — CBC
HCT: 26.3 % — ABNORMAL LOW (ref 36.0–46.0)
Hemoglobin: 8.6 g/dL — ABNORMAL LOW (ref 12.0–15.0)
MCH: 23.8 pg — ABNORMAL LOW (ref 26.0–34.0)
MCHC: 32.7 g/dL (ref 30.0–36.0)
MCV: 72.9 fL — ABNORMAL LOW (ref 78.0–100.0)
Platelets: 166 10*3/uL (ref 150–400)
RBC: 3.61 MIL/uL — ABNORMAL LOW (ref 3.87–5.11)
RDW: 16.7 % — ABNORMAL HIGH (ref 11.5–15.5)
WBC: 10.6 10*3/uL — ABNORMAL HIGH (ref 4.0–10.5)

## 2014-03-08 LAB — BASIC METABOLIC PANEL
Anion gap: 12 (ref 5–15)
BUN: 12 mg/dL (ref 6–23)
CO2: 25 mmol/L (ref 19–32)
Calcium: 8.1 mg/dL — ABNORMAL LOW (ref 8.4–10.5)
Chloride: 98 mmol/L (ref 96–112)
Creatinine, Ser: 0.89 mg/dL (ref 0.50–1.10)
GFR calc Af Amer: 81 mL/min — ABNORMAL LOW (ref >90)
GFR calc non Af Amer: 70 mL/min — ABNORMAL LOW (ref >90)
Glucose, Bld: 86 mg/dL (ref 70–99)
Potassium: 3.6 mmol/L (ref 3.5–5.1)
Sodium: 135 mmol/L (ref 135–145)

## 2014-03-08 LAB — GLUCOSE, CAPILLARY
Glucose-Capillary: 114 mg/dL — ABNORMAL HIGH (ref 70–99)
Glucose-Capillary: 91 mg/dL (ref 70–99)
Glucose-Capillary: 96 mg/dL (ref 70–99)
Glucose-Capillary: 111 mg/dL — ABNORMAL HIGH (ref 70–99)
Glucose-Capillary: 105 mg/dL — ABNORMAL HIGH (ref 70–99)

## 2014-03-08 MED ORDER — HYDRALAZINE HCL 20 MG/ML IJ SOLN: 10.0000 mg | INTRAMUSCULAR | Status: DC | PRN

## 2014-03-08 MED ORDER — LISINOPRIL 10 MG PO TABS
10.0000 mg | ORAL_TABLET | Freq: Every day | ORAL | Status: DC
  Administered 2014-03-08: 10 mg via ORAL
  Filled 2014-03-08 (×2): qty 1

## 2014-03-08 MED ORDER — POTASSIUM CHLORIDE CRYS ER 20 MEQ PO TBCR
40.0000 meq | EXTENDED_RELEASE_TABLET | Freq: Once | ORAL | Status: AC
  Administered 2014-03-08: 40 meq via ORAL
  Filled 2014-03-08 (×2): qty 2

## 2014-03-08 NOTE — Progress Notes (Signed)
Physical Therapy Treatment Patient Details Name: Alexis Wheeler MRN: [id number] DOB: [birth year] Today's Date: 03/08/2014    History of Present Illness Patien transferred to [facility] with acute Type 1 aortic dissection. She presented complaining of f anterior chest pain radiating to the epigastrium accompanied by numbness of left leg which started at 8 PM 2/19. S/p repair 2/22    PT Comments    Pt continuing to progress slowly with max encouragement to participate. Pt educated for sternal precautions, mobility and function. Pt encouraged to be OOB for all meals, increase gait with staff and to maintain precautions with all mobility. Will continue to follow. Dgtr present for session and educated for all.   Follow Up Recommendations  Home health PT;Supervision for mobility/OOB     Equipment Recommendations  Rolling walker with 5" wheels    Recommendations for Other Services OT consult     Precautions / Restrictions Precautions Precautions: Sternal Precaution Comments: precautions reviewed with pt able to state 3 end of session and handout provided Restrictions Weight Bearing Restrictions: No    Mobility  Bed Mobility Overal bed mobility: Needs Assistance Bed Mobility: Rolling;Sidelying to Sit Rolling: Min assist Sidelying to sit: Min assist       General bed mobility comments: cues for hand placement, sequence, precautions and safety with increased time  Transfers Overall transfer level: Needs assistance   Transfers: Sit to/from Stand Sit to Stand: Min assist         General transfer comment: cues for hand placement, seqeunce and assist for anterior translation and full elevation from surface  Ambulation/Gait Ambulation/Gait assistance: Min guard Ambulation Distance (Feet): 160 Feet Assistive device: Rolling walker (2 wheeled) Gait Pattern/deviations: Step-through pattern;Decreased stride length   Gait velocity interpretation: Below normal speed for  age/gender General Gait Details: cues for posture and position in RW 4 standing rest breaks   Stairs            Wheelchair Mobility    Modified Rankin (Stroke Patients Only)       Balance Overall balance assessment: Needs assistance   Sitting balance-Leahy Scale: Good       Standing balance-Leahy Scale: Fair                      Cognition Arousal/Alertness: Awake/alert Behavior During Therapy: Flat affect Overall Cognitive Status: Within Functional Limits for tasks assessed       Memory: Decreased recall of precautions              Exercises      General Comments        Pertinent Vitals/Pain Pain Assessment: 0-10 Pain Score: 5  Pain Location: incisional Pain Descriptors / Indicators: Aching Pain Intervention(s): Repositioned;Other (comment) (encouraged to request meds as needed)  HR 86-89    Home Living                      Prior Function            PT Goals (current goals can now be found in the care plan section) Progress towards PT goals: Progressing toward goals    Frequency       PT Plan Current plan remains appropriate    Co-evaluation             End of Session   Activity Tolerance: Patient tolerated treatment well Patient left: in chair;with call bell/phone within reach;with family/visitor present     Time: 3151-7616 PT Time Calculation (min) (ACUTE  ONLY): 26 min  Charges:  $Gait Training: 8-22 mins $Therapeutic Activity: 8-22 mins                    G Codes:      Melford Aase 03/23/2014, 12:32 PM Elwyn Reach, [place]

## 2014-03-08 NOTE — Progress Notes (Signed)
1430 Offered to walk with pt. Sleepy. Asked for pain med. Notified RN that pt requesting pain med. Will follow up later as time permits. Graylon Good RN BSN 03/08/2014 2:31 PM

## 2014-03-08 NOTE — Progress Notes (Addendum)
[address]       [place] [address]             [phone]      5 Days Post-Op Procedure(s) (LRB): REPAIR OF ASCENDING AORTIC DISSECTION (N/A) Subjective: No new complaints  Objective: Vital signs in last 24 hours: Temp:  [98.5 F (36.9 C)-99.5 F (37.5 C)] 99.5 F (37.5 C) (02/25 0421) Pulse Rate:  [69-81] 81 (02/25 0421) Cardiac Rhythm:  [-] Normal sinus rhythm (02/24 2326) Resp:  [14-28] 18 (02/25 0421) BP: (138-180)/(58-81) 159/58 mmHg (02/25 0421) SpO2:  [93 %-97 %] 95 % (02/25 0421) Weight:  [217 lb 8 oz (98.657 kg)] 217 lb 8 oz (98.657 kg) (02/25 0421)  Hemodynamic parameters for last 24 hours:    Intake/Output from previous day: 02/24 0701 - 02/25 0700 In: -  Out: 7 [Urine:3; Stool:4] Intake/Output this shift:    General appearance: alert, cooperative and no distress Heart: regular rate and rhythm Lungs: dim in left >right base Abdomen: benign Extremities: no edema, DP pulses intact Wound: dressings C+D  Lab Results:  Recent Labs  03/07/14 0420 03/08/14 0429  WBC 10.6* 10.6*  HGB 7.5* 8.6*  HCT 23.1* 26.3*  PLT 116* 166   BMET:  Recent Labs  03/07/14 0420 03/08/14 0429  NA 137 135  K 3.7 3.6  CL 103 98  CO2 27 25  GLUCOSE 106* 86  BUN 18 12  CREATININE 1.13* 0.89  CALCIUM 7.7* 8.1*    PT/INR: No results for input(s): LABPROT, INR in the last 72 hours. ABG    Component Value Date/Time   PHART 7.388 03/04/2014 0653   HCO3 26.1* 03/04/2014 0653   TCO2 23 03/04/2014 1635   ACIDBASEDEF 1.0 03/03/2014 1109   O2SAT 98.0 03/04/2014 0653   CBG (last 3)   Recent Labs  03/07/14 1620 03/07/14 2100 03/08/14 0633  GLUCAP 115* 101* 91    Meds Scheduled Meds: . amLODipine  10 mg Oral Daily  . aspirin EC  81 mg Oral Daily  . atorvastatin  40 mg Oral q1800  . docusate sodium  200 mg Oral Daily  . ferrous sulfate  325 mg Oral Q breakfast  . folic acid  1 mg Oral Daily  . insulin aspart  0-24 Units  Subcutaneous TID AC & HS  . metoprolol tartrate  75 mg Oral BID  . pantoprazole  40 mg Oral QAC breakfast  . sodium chloride  3 mL Intravenous Q12H   Continuous Infusions:  PRN Meds:.sodium chloride, bisacodyl **OR** bisacodyl, ondansetron **OR** ondansetron (ZOFRAN) IV, sodium chloride, traMADol  Xrays Dg Chest 2 View  03/08/2014   CLINICAL DATA:  History of thoracic aortic dissection, followup  EXAM: CHEST  2 VIEW  COMPARISON:  Portable chest x-ray of 03/07/2014 and CT angio chest of 03/02/2014  FINDINGS: The lungs are not well aerated with basilar atelectasis and small effusions present. Moderate cardiomegaly is stable and there may be mild pulmonary vascular congestion present. The right IJ venous sheath has been removed and no pneumothorax is seen.  IMPRESSION: Poor aeration with mild pulmonary vascular congestion and small effusions.   Electronically Signed   By: Ivar Drape M.D.   On: 03/08/2014 08:08   Dg Chest Port 1 View  03/07/2014   CLINICAL DATA:  Status post aortic dissection repair  EXAM: PORTABLE CHEST - 1 VIEW  COMPARISON:  03/05/2014  FINDINGS: Cardiac shadow is mildly enlarged but stable. Postsurgical changes are again seen. The  right jugular sheath remains in satisfactory position. Right lung is clear. Persistent mild left basilar atelectasis and associated effusion is noted.  IMPRESSION: Improved aeration although persistent left basilar effusion and atelectasis is noted.   Electronically Signed   By: Inez Catalina M.D.   On: 03/07/2014 08:13    Assessment/Plan: S/P Procedure(s) (LRB): REPAIR OF ASCENDING AORTIC DISSECTION (N/A)  1 doing well 2 some HTN- add ACE- creat is normal 3 sugars controlled 4 labs stable 5 pulm toilet/rehab 6 replace K+   LOS: 5 days    GOLD,WAYNE E 03/08/2014   Cr stable worl on mobility  Grace Isaac MD Beeper [phone] Office [phone] 03/08/2014 8:51 AM

## 2014-03-08 NOTE — Progress Notes (Signed)
     SUBJECTIVE: No chest pain or SOB.   BP 159/58 mmHg  Pulse 81  Temp(Src) 99.5 F (37.5 C) (Oral)  Resp 18  Ht 5\' 4"  (1.626 m)  Wt 217 lb 8 oz (98.657 kg)  BMI 37.32 kg/m2  SpO2 95%  Intake/Output Summary (Last 24 hours) at 03/08/14 1038 Last data filed at 03/07/14 1757  Gross per 24 hour  Intake      0 ml  Output      5 ml  Net     -5 ml    PHYSICAL EXAM General: Well developed, well nourished, in no acute distress. Alert and oriented x 3.  Psych:  Good affect, responds appropriately Neck: No JVD. No masses noted.  Lungs: Clear bilaterally with no wheezes or rhonci noted.  Heart: RRR with no murmurs noted. Abdomen: Bowel sounds are present. Soft, non-tender.  Extremities: No lower extremity edema.   LABS: Basic Metabolic Panel:  Recent Labs  03/07/14 0420 03/08/14 0429  NA 137 135  K 3.7 3.6  CL 103 98  CO2 27 25  GLUCOSE 106* 86  BUN 18 12  CREATININE 1.13* 0.89  CALCIUM 7.7* 8.1*   CBC:  Recent Labs  03/07/14 0420 03/08/14 0429  WBC 10.6* 10.6*  HGB 7.5* 8.6*  HCT 23.1* 26.3*  MCV 73.8* 72.9*  PLT 116* 166   Fasting Lipid Panel:  Recent Labs  03/06/14 0512  CHOL 87  HDL 27*  LDLCALC 38  TRIG 108  CHOLHDL 3.2    Current Meds: . amLODipine  10 mg Oral Daily  . aspirin EC  81 mg Oral Daily  . atorvastatin  40 mg Oral q1800  . docusate sodium  200 mg Oral Daily  . ferrous sulfate  325 mg Oral Q breakfast  . folic acid  1 mg Oral Daily  . insulin aspart  0-24 Units Subcutaneous TID AC & HS  . lisinopril  10 mg Oral Daily  . metoprolol tartrate  75 mg Oral BID  . pantoprazole  40 mg Oral QAC breakfast  . potassium chloride  40 mEq Oral Once  . sodium chloride  3 mL Intravenous Q12H     ASSESSMENT AND PLAN:  1. 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 Recovering well. Mgmt per surgery.  2. Essential Hypertension: BP elevated. Lisinopril started today. Titrate for BP  control. Continue Norvasc 10 mg daily and Lopressor 75 mg po BID. Will write for prn hydralazine for BP over 160.    3. Mild LV systolic dysfunction: BJSE=83-15%. Will manage medically for now. Ace Lisinopril today. Continue beta blocker.     Would titrate meds as needed for BP control. Will sign off. Please call with questions.   Laquanta Hummel  2/25/201610:38 AM

## 2014-03-08 NOTE — Progress Notes (Signed)
CARDIAC REHAB PHASE I   PRE:  Rate/Rhythm: 77SR  BP:  Supine:   Sitting: 161/81  Standing:    SaO2: 93%RA  MODE:  Ambulation: BSC and then chair  ft   1500-1520 Tried to ambulate with pt but c/o dizziness after getting to [facility]. To chair. Sats good on RA. Rhythm stable. Pt did not want to try to walk with chair behind her. Wanted to stay in chair. Daughter in room. Encouraged pt to walk with staff later.      Graylon Good, RN BSN  03/08/2014 3:36 PM

## 2014-03-09 LAB — GLUCOSE, CAPILLARY: Glucose-Capillary: 99 mg/dL (ref 70–99)

## 2014-03-09 LAB — CLOSTRIDIUM DIFFICILE BY PCR: Toxigenic C. Difficile by PCR: NEGATIVE

## 2014-03-09 LAB — HEMOGLOBIN A1C
Hgb A1c MFr Bld: 5.8 % — ABNORMAL HIGH (ref 4.8–5.6)
Mean Plasma Glucose: 120 mg/dL

## 2014-03-09 MED ORDER — LISINOPRIL 20 MG PO TABS
20.0000 mg | ORAL_TABLET | Freq: Every day | ORAL | Status: DC
  Administered 2014-03-09: 20 mg via ORAL
  Filled 2014-03-09 (×2): qty 1

## 2014-03-09 MED ORDER — FUROSEMIDE 40 MG PO TABS
40.0000 mg | ORAL_TABLET | Freq: Every day | ORAL | Status: DC
  Administered 2014-03-09 – 2014-03-13 (×5): 40 mg via ORAL
  Filled 2014-03-09 (×6): qty 1

## 2014-03-09 MED ORDER — POTASSIUM CHLORIDE CRYS ER 20 MEQ PO TBCR
20.0000 meq | EXTENDED_RELEASE_TABLET | Freq: Every day | ORAL | Status: DC
  Administered 2014-03-09: 20 meq via ORAL
  Filled 2014-03-09 (×2): qty 1

## 2014-03-09 NOTE — Progress Notes (Signed)
Physical Therapy Treatment Patient Details Name: Alexis Wheeler MRN: [id number] DOB: 1955-09-28 Today's Date: 03/09/2014    History of Present Illness Patien transferred to [facility] with acute Type 1 aortic dissection. She presented complaining of f anterior chest pain radiating to the epigastrium accompanied by numbness of left leg which started at 8 PM 2/19. S/p repair 2/22    PT Comments    Patient progressing well towards physical therapy goals, ambulating up to 175 feet today, HR in 80s, SpO2 94% and greater on room air. Tolerating therapeutic exercises well. Needs cues for sternal precautions, which were reviewed again with patient and family. Patient will continue to benefit from skilled physical therapy services to further improve independence with functional mobility.   Follow Up Recommendations  Home health PT;Supervision for mobility/OOB     Equipment Recommendations  Rolling walker with 5" wheels;3in1 (PT)    Recommendations for Other Services OT consult     Precautions / Restrictions Precautions Precautions: Sternal Precaution Comments: precautions reviewed Restrictions Weight Bearing Restrictions: Yes Other Position/Activity Restrictions: sternal    Mobility  Bed Mobility Overal bed mobility: Needs Assistance Bed Mobility: Rolling;Sidelying to Sit;Sit to Sidelying Rolling: Supervision Sidelying to sit: Min assist     Sit to sidelying: Min guard General bed mobility comments: Min assist for truncal support into seated position during log roll education. Cues to maintain sternal precautions.  Transfers Overall transfer level: Needs assistance Equipment used: Rolling walker (2 wheeled) Transfers: Sit to/from Stand Sit to Stand: Min guard         General transfer comment: min guard for safety. VC to maintain sternal precautions.  Ambulation/Gait Ambulation/Gait assistance: Min guard Ambulation Distance (Feet): 175 Feet Assistive device: Rolling walker  (2 wheeled) Gait Pattern/deviations: Step-through pattern;Decreased stride length;Trunk flexed Gait velocity: decreased Gait velocity interpretation: Below normal speed for age/gender General Gait Details: Slow and guarded, VC to lightly touch walker for balance to maintain sternal precautions. No loss of balance noted. Did not require any rest breaks to complete distance. HR in low 90s.   Stairs            Wheelchair Mobility    Modified Rankin (Stroke Patients Only)       Balance                                    Cognition Arousal/Alertness: Lethargic Behavior During Therapy: Flat affect Overall Cognitive Status: Within Functional Limits for tasks assessed       Memory: Decreased recall of precautions              Exercises General Exercises - Lower Extremity Ankle Circles/Pumps: AROM;Both;10 reps;Seated Long Arc Quad: Strengthening;Both;10 reps;Seated Hip Flexion/Marching: Strengthening;Both;10 reps;Seated    General Comments General comments (skin integrity, edema, etc.): Reviewed safe mobility techniques to maintain sternal precautions with family present in room.      Pertinent Vitals/Pain Pain Assessment: 0-10 Pain Score:  ("doing better" no value given) Pain Location: incisional Pain Descriptors / Indicators: Aching Pain Intervention(s): Monitored during session;Repositioned    Home Living                      Prior Function            PT Goals (current goals can now be found in the care plan section) Acute Rehab PT Goals PT Goal Formulation: With patient Time For Goal Achievement: 03/20/14 Potential to Achieve  Goals: Good Progress towards PT goals: Progressing toward goals    Frequency  Min 3X/week    PT Plan Equipment recommendations need to be updated    Co-evaluation             End of Session Equipment Utilized During Treatment: Gait belt Activity Tolerance: Patient tolerated treatment  well Patient left: with call bell/phone within reach;with family/visitor present;with SCD's reapplied;in bed     Time: 4709-6283 PT Time Calculation (min) (ACUTE ONLY): 25 min  Charges:  $Gait Training: 8-22 mins $Therapeutic Activity: 8-22 mins                    G Codes:      Ellouise Newer 07-Apr-2014, 2:37 PM Camille Bal [place], [place]

## 2014-03-09 NOTE — Progress Notes (Signed)
CARDIAC REHAB PHASE I   PRE:  Rate/Rhythm: 87 SR  BP:  Supine: 168/75  Sitting:   Standing:    SaO2: 95%RA  MODE:  Ambulation: 150 ft   POST:  Rate/Rhythm: 94 SR  BP:  Supine:   Sitting: 170/69  Standing:    SaO2: 95%RA 8099-8338 Pt needs much encouragement to mobilize. Walked 150 ft with asst x 1 with one asst following with rollator. Pt sat once to rest due to heavy belly. No dizziness as yesterday when we tried to walk. To recliner after walk with call bel. Encouraged IS and two more walks.   Graylon Good, RN BSN  03/09/2014 9:52 AM

## 2014-03-09 NOTE — Progress Notes (Addendum)
      [address]       [place] [address]             [phone]        6 Days Post-Op Procedure(s) (LRB): REPAIR OF ASCENDING AORTIC DISSECTION (N/A)  Subjective: Patient with loose stools.  Objective: Vital signs in last 24 hours: Temp:  [98.2 F (36.8 C)-99 F (37.2 C)] 98.2 F (36.8 C) (02/26 0408) Pulse Rate:  [74-89] 79 (02/26 0408) Cardiac Rhythm:  [-] Normal sinus rhythm (02/26 0121) Resp:  [18-21] 21 (02/26 0408) BP: (154-176)/(62-79) 162/70 mmHg (02/26 0408) SpO2:  [95 %-98 %] 97 % (02/26 0408) Weight:  [208 lb 14.4 oz (94.756 kg)] 208 lb 14.4 oz (94.756 kg) (02/26 0408)  Pre op weight 91 kg Current Weight  03/09/14 208 lb 14.4 oz (94.756 kg)      Intake/Output from previous day: 02/25 0701 - 02/26 0700 In: 240 [P.O.:240] Out: 300 [Urine:300]   Physical Exam:  Cardiovascular: RRR Pulmonary: Clear to auscultation bilaterally; no rales, wheezes, or rhonchi. Abdomen: Soft, non tender, bowel sounds present. Extremities: Mild bilateral lower extremity edema. Palpable pulses and feet are warm bilaterally. Wounds: Clean and dry.  No erythema or signs of infection.  Lab Results: CBC: Recent Labs  03/07/14 0420 03/08/14 0429  WBC 10.6* 10.6*  HGB 7.5* 8.6*  HCT 23.1* 26.3*  PLT 116* 166   BMET:  Recent Labs  03/07/14 0420 03/08/14 0429  NA 137 135  K 3.7 3.6  CL 103 98  CO2 27 25  GLUCOSE 106* 86  BUN 18 12  CREATININE 1.13* 0.89  CALCIUM 7.7* 8.1*    PT/INR:  Lab Results  Component Value Date   INR 0.96 03/03/2014   ABG:  INR: Will add last result for INR, ABG once components are confirmed Will add last 4 CBG results once components are confirmed  Assessment/Plan:  1. CV - SR in the 80's. Hypertensive. On Norvasc 10 daily, Lisinopril 10 daily, and Lopressor 75 bid. Will increase Lisinopril to 20 daily for bette BP control. 2.  Pulmonary - On room air.Encourage incentive spirometer 3. Volume Overload - Will  give Lasix 40 daily 4.  Acute blood loss anemia - H and H stable yesterday at  8.6 and 26.3. Continue ferrous. 5. Deconditioning-continue with PT and cardiac rehab. She needs A LOT of encouragement. 6. CBGs 111/105/99. Pre op HGA1C 5.8. She is likely pre diabetic. She will need further surveillance by medical doctor as an outpatient. Stop accu checks and SS 7. Stop stool softeners  ZIMMERMAN,DONIELLE MPA-C 03/09/2014,8:35 AM  Walked some today but still weak ACE increased, cr has returned to normal  I have seen and examined Alexis Wheeler and agree with the above assessment  and plan.  Grace Isaac MD Beeper [phone] Office [phone] 03/09/2014 10:02 AM

## 2014-03-10 LAB — BASIC METABOLIC PANEL
Anion gap: 8 (ref 5–15)
BUN: 6 mg/dL (ref 6–23)
CO2: 29 mmol/L (ref 19–32)
Calcium: 7.9 mg/dL — ABNORMAL LOW (ref 8.4–10.5)
Chloride: 98 mmol/L (ref 96–112)
Creatinine, Ser: 0.87 mg/dL (ref 0.50–1.10)
GFR calc Af Amer: 83 mL/min — ABNORMAL LOW (ref >90)
GFR calc non Af Amer: 72 mL/min — ABNORMAL LOW (ref >90)
Glucose, Bld: 100 mg/dL — ABNORMAL HIGH (ref 70–99)
Potassium: 3 mmol/L — ABNORMAL LOW (ref 3.5–5.1)
Sodium: 135 mmol/L (ref 135–145)

## 2014-03-10 MED ORDER — LISINOPRIL 40 MG PO TABS
40.0000 mg | ORAL_TABLET | Freq: Every day | ORAL | Status: DC
  Administered 2014-03-10 – 2014-03-14 (×5): 40 mg via ORAL
  Filled 2014-03-10 (×5): qty 1

## 2014-03-10 MED ORDER — METOPROLOL TARTRATE 100 MG PO TABS
100.0000 mg | ORAL_TABLET | Freq: Two times a day (BID) | ORAL | Status: DC
  Administered 2014-03-10 – 2014-03-14 (×8): 100 mg via ORAL
  Filled 2014-03-10 (×10): qty 1

## 2014-03-10 MED ORDER — METOPROLOL TARTRATE 25 MG PO TABS
25.0000 mg | ORAL_TABLET | Freq: Once | ORAL | Status: AC
  Administered 2014-03-10: 25 mg via ORAL
  Filled 2014-03-10: qty 1

## 2014-03-10 MED ORDER — POTASSIUM CHLORIDE CRYS ER 20 MEQ PO TBCR
40.0000 meq | EXTENDED_RELEASE_TABLET | Freq: Two times a day (BID) | ORAL | Status: DC
  Administered 2014-03-10 (×2): 40 meq via ORAL
  Filled 2014-03-10 (×4): qty 2

## 2014-03-10 NOTE — Progress Notes (Addendum)
      [address]       [place] [address]             [phone]      7 Days Post-Op Procedure(s) (LRB): REPAIR OF ASCENDING AORTIC DISSECTION (N/A) Subjective: Feels somewhat weak but ok, Having some loose stools  Objective: Vital signs in last 24 hours: Temp:  [98.2 F (36.8 C)-98.6 F (37 C)] 98.2 F (36.8 C) (02/27 0400) Pulse Rate:  [77-81] 77 (02/27 0400) Cardiac Rhythm:  [-] Normal sinus rhythm (02/26 2000) Resp:  [16-17] 16 (02/27 0400) BP: (134-165)/(65-71) 134/71 mmHg (02/27 0400) SpO2:  [99 %-100 %] 100 % (02/27 0400) Weight:  [201 lb 3.2 oz (91.264 kg)] 201 lb 3.2 oz (91.264 kg) (02/27 0400)  Hemodynamic parameters for last 24 hours:    Intake/Output from previous day:   Intake/Output this shift:    General appearance: alert, cooperative and no distress Heart: regular rate and rhythm Lungs: dim in the lower bases Abdomen: benign Extremities: no signif edema Wound: incis healing well  Lab Results:  Recent Labs  03/08/14 0429  WBC 10.6*  HGB 8.6*  HCT 26.3*  PLT 166   BMET:  Recent Labs  03/08/14 0429 03/10/14 0339  NA 135 135  K 3.6 3.0*  CL 98 98  CO2 25 29  GLUCOSE 86 100*  BUN 12 6  CREATININE 0.89 0.87  CALCIUM 8.1* 7.9*    PT/INR: No results for input(s): LABPROT, INR in the last 72 hours. ABG    Component Value Date/Time   PHART 7.388 03/04/2014 0653   HCO3 26.1* 03/04/2014 0653   TCO2 23 03/04/2014 1635   ACIDBASEDEF 1.0 03/03/2014 1109   O2SAT 98.0 03/04/2014 0653   CBG (last 3)   Recent Labs  03/08/14 1604 03/08/14 2115 03/09/14 0614  GLUCAP 111* 105* 99    Meds Scheduled Meds: . amLODipine  10 mg Oral Daily  . aspirin EC  81 mg Oral Daily  . atorvastatin  40 mg Oral q1800  . ferrous sulfate  325 mg Oral Q breakfast  . folic acid  1 mg Oral Daily  . furosemide  40 mg Oral Daily  . lisinopril  20 mg Oral Daily  . metoprolol tartrate  75 mg Oral BID  . pantoprazole  40 mg Oral QAC  breakfast  . potassium chloride  20 mEq Oral Daily  . sodium chloride  3 mL Intravenous Q12H   Continuous Infusions:  PRN Meds:.sodium chloride, hydrALAZINE, ondansetron **OR** ondansetron (ZOFRAN) IV, sodium chloride, traMADol  Xrays No results found.  Assessment/Plan: S/P Procedure(s) (LRB): REPAIR OF ASCENDING AORTIC DISSECTION (N/A)  1 doing well overall 2 loose stools, with prevalence of cdiff in hospital will check stools, d/c stool softeners 3 replace K+, otherwise labs stable, H/H improved 4 cont PT-rehab 4 BP needs better control, will maximize lisinopril dose. May need another agent    LOS: 7 days    GOLD,WAYNE E 03/10/2014  I have seen and examined the patient and agree with the assessment and plan as outlined.  Will try increasing metoprolol 100 bid  Aislinn Feliz H 03/10/2014 11:11 AM

## 2014-03-10 NOTE — Progress Notes (Signed)
Asked patient on 2 occasions did she want to walk and she declined. Gave patient pain medication as ordered and she stated that she would walk with the night RN.

## 2014-03-10 NOTE — Progress Notes (Signed)
CARDIAC REHAB PHASE I   PRE:  Rate/Rhythm: 83 sinus rhythm  BP:  Supine:   Sitting: 152/67  Standing:    SaO2: 95% ra  MODE:  Ambulation: 300 ft   POST:  Rate/Rhythem: 88 sinus rhythm  BP:  Supine:   Sitting: 170/68, recheck:  150/84  Standing:    SaO2: 95% ra  925-940  Pt ambulated in hallway x 2 assist using rolling walker.  Steady gait. Pt took 1 standing rest break.  Pt needed encouragement to get started with walking.  Asymptomatic with ambulation.  Pt reminded importance of ambulation 3 times daily with staff.  Pt returned to bed with call light in reach.  Sternal precautions reinforced.  Pt verbalized understanding.    Ammara Raj, [place]

## 2014-03-11 LAB — BASIC METABOLIC PANEL
Anion gap: 7 (ref 5–15)
BUN: 7 mg/dL (ref 6–23)
CO2: 30 mmol/L (ref 19–32)
Calcium: 8.2 mg/dL — ABNORMAL LOW (ref 8.4–10.5)
Chloride: 98 mmol/L (ref 96–112)
Creatinine, Ser: 0.92 mg/dL (ref 0.50–1.10)
GFR calc Af Amer: 77 mL/min — ABNORMAL LOW (ref >90)
GFR calc non Af Amer: 67 mL/min — ABNORMAL LOW (ref >90)
Glucose, Bld: 117 mg/dL — ABNORMAL HIGH (ref 70–99)
Potassium: 3.5 mmol/L (ref 3.5–5.1)
Sodium: 135 mmol/L (ref 135–145)

## 2014-03-11 MED ORDER — POTASSIUM CHLORIDE CRYS ER 20 MEQ PO TBCR
40.0000 meq | EXTENDED_RELEASE_TABLET | Freq: Every day | ORAL | Status: DC
  Administered 2014-03-11 – 2014-03-13 (×3): 40 meq via ORAL
  Filled 2014-03-11 (×4): qty 2

## 2014-03-11 NOTE — Progress Notes (Signed)
Patient declined to walk any laps in the hallway on 7p-7a shift last night.  She reports " not feeling good in my body."  Patient did get up and down to the bedside commode without assistance over night.

## 2014-03-11 NOTE — Progress Notes (Addendum)
      [address]       [place] [address]             [phone]      8 Days Post-Op Procedure(s) (LRB): REPAIR OF ASCENDING AORTIC DISSECTION (N/A) Subjective: Feels ok , fatigue is an issue  Objective: Vital signs in last 24 hours: Temp:  [98.4 F (36.9 C)-98.7 F (37.1 C)] 98.7 F (37.1 C) (02/28 0511) Pulse Rate:  [71-78] 78 (02/28 0710) Cardiac Rhythm:  [-] Normal sinus rhythm (02/27 2047) Resp:  [18] 18 (02/28 0511) BP: (113-143)/(54-70) 143/59 mmHg (02/28 0710) SpO2:  [97 %-98 %] 97 % (02/28 0511) Weight:  [198 lb 3.1 oz (89.9 kg)] 198 lb 3.1 oz (89.9 kg) (02/28 0511)  Hemodynamic parameters for last 24 hours:    Intake/Output from previous day: 02/27 0701 - 02/28 0700 In: 360 [P.O.:360] Out: 250 [Urine:250] Intake/Output this shift:    General appearance: alert, cooperative and no distress Heart: regular rate and rhythm Lungs: dim in left base Abdomen: abnormal findings:  soft, nontender Extremities: no edema Wound: incis healing well  Lab Results: No results for input(s): WBC, HGB, HCT, PLT in the last 72 hours. BMET:  Recent Labs  03/10/14 0339 03/11/14 0500  NA 135 135  K 3.0* 3.5  CL 98 98  CO2 29 30  GLUCOSE 100* 117*  BUN 6 7  CREATININE 0.87 0.92  CALCIUM 7.9* 8.2*    PT/INR: No results for input(s): LABPROT, INR in the last 72 hours. ABG    Component Value Date/Time   PHART 7.388 03/04/2014 0653   HCO3 26.1* 03/04/2014 0653   TCO2 23 03/04/2014 1635   ACIDBASEDEF 1.0 03/03/2014 1109   O2SAT 98.0 03/04/2014 0653   CBG (last 3)   Recent Labs  03/08/14 1604 03/08/14 2115 03/09/14 0614  GLUCAP 111* 105* 99    Meds Scheduled Meds: . amLODipine  10 mg Oral Daily  . aspirin EC  81 mg Oral Daily  . atorvastatin  40 mg Oral q1800  . ferrous sulfate  325 mg Oral Q breakfast  . folic acid  1 mg Oral Daily  . furosemide  40 mg Oral Daily  . lisinopril  40 mg Oral Daily  . metoprolol tartrate  100 mg Oral  BID WC  . pantoprazole  40 mg Oral QAC breakfast  . potassium chloride  40 mEq Oral BID  . sodium chloride  3 mL Intravenous Q12H   Continuous Infusions:  PRN Meds:.sodium chloride, hydrALAZINE, ondansetron **OR** ondansetron (ZOFRAN) IV, sodium chloride, traMADol  Xrays No results found.  Assessment/Plan: S/P Procedure(s) (LRB): REPAIR OF ASCENDING AORTIC DISSECTION (N/A)  1 steady progress 2 loose stools improved- not cdiff 3 replace K+ further 4 BP control is improving 5 push rehab as able 6 may be able to d/c diuretic soon, check CXR in am to eval L effusion  LOS: 8 days    GOLD,WAYNE E 03/11/2014  I have seen and examined the patient and agree with the assessment and plan as outlined.  BP well controlled on current regimen.  OWEN,CLARENCE H 03/11/2014 11:04 AM

## 2014-03-12 ENCOUNTER — Inpatient Hospital Stay (HOSPITAL_COMMUNITY): Payer: BLUE CROSS/BLUE SHIELD

## 2014-03-12 NOTE — Discharge Summary (Signed)
[address]       [place] [address]             [phone]              Discharge Summary  Name: Alexis Wheeler DOB: 05/20/1955 59 y.o. MRN: [id number]   Admission Date: 03/02/2014 Discharge Date: 03/14/2014    Admitting Diagnosis: Chest pain Acute type I aortic dissection   Discharge Diagnosis:  Acute type I aortic dissection Aortic insufficiency Expected postoperative blood loss anemia Postoperative thrombocytopenia Postoperative acute kidney injury Mild LV systolic dysfunction (EF 85-02%)  Past Medical History  Diagnosis Date  . Morbid obesity   . HEMATURIA UNSPECIFIED   . GERD   . MYOSITIS   . HYPERTENSION   . HYPERLIPIDEMIA   . Vertigo       Procedures: REPLACEMENT OF ASCENDING AORTA (hypothermic circulatory arrest)  - 03/03/2014  Resuspension of aortic valve  30 mm Hemashield Platinum graft    HPI:  The patient is a 59 y.o. female with a history of hypertension who presented to the ER at [facility] on the date of admission complaining of anterior chest pain radiating to the epigastrium accompanied by numbness and weakness of the left leg. She had some associated mild shortness of breath and nausea.  A CTA of the chest, abdomen and pelvis was performed and revealed a type I aortic dissection starting just above the aortic valve and extending into the left iliac artery with probable loss of the left main renal artery and patent accessory lower renal artery, as well as patent right renal artery.  A cardiac surgical consultation was requested and Dr. Servando Snare saw the patient and reviewed her films. The patient was transferred urgently to [facility] for surgical repair. All risks, benefits and alternatives of surgery were explained in detail, and the patient agreed to proceed.    Hospital Course:  The patient was admitted to [facility] on 03/02/2014.  The patient was taken to the operating room and underwent the above  procedure.  Initially, prior to induction of anesthesia, the patient was noted to have no pulses in the left leg, however, postoperatively  she was noted to have palpable pulses on the left.   An intraoperative vascular surgery consult was obtained for evaluation of the transient ischemia of her left lower extremity.  Dr. Scot Dock saw the patient and reviewed her films and felt that she had reperfused her common femoral artery and would not need further surgical intervention.   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  This was monitored and treated conservatively.  Creatinine peaked at 1.63 and has since trended down to baseline.   The patient has been hypertensive postoperatively, and initially required a nitrogycerin drip.  This was weaned and discontinued on postop day 2 and she was started on Lopressor and a Catapres patch. Cardiology was consulted to assist with management. Catapres was discontinued and she was restarted on Norvasc.  A 2D echo was performed which revealed mild LV systolic dysfunction with EF 40-45%. Once her renal function stabilized, she was restarted on an ACE-I.  Her medications have been titrated as needed and presently her blood pressure control is improved.  She has otherwise made slow and steady progress.  PT was consulted to assist with mobility.  She is ambulating well with assistance  and further home PT has been recommended.  Incisions are healing well. Every other skin staple has been removed and the home health nurse will remove the remaining staples in 1 week.  She has had a mild postop anemia which has not required transfusion and for which she was started on oral iron. She has had a left pleural effusion on chest x-ray which has improved with diuresis. She is tolerating a regular diet.  Overall, she is doing well and is medically stable on today's date  for discharge home.     Recent vital signs:  Filed Vitals:   03/14/14 0449  BP: 116/70  Pulse: 72  Temp: 98.3 F (36.8 C)  Resp: 18    Recent laboratory studies:  CBC:No results for input(s): WBC, HGB, HCT, PLT in the last 72 hours. BMET:   Recent Labs  03/13/14 0517  NA 136  K 3.7  CL 95*  CO2 30  GLUCOSE 107*  BUN 8  CREATININE 0.89  CALCIUM 8.5    PT/INR: No results for input(s): LABPROT, INR in the last 72 hours.   Discharge Medications:     Medication List    STOP taking these medications        naproxen 500 MG tablet  Commonly known as:  NAPROSYN     triamterene-hydrochlorothiazide 37.5-25 MG per tablet  Commonly known as:  MAXZIDE-25      TAKE these medications        amLODipine 10 MG tablet  Commonly known as:  NORVASC  Take 1 tablet (10 mg total) by mouth daily.     aspirin 81 MG EC tablet  Take 1 tablet (81 mg total) by mouth daily.     atorvastatin 40 MG tablet  Commonly known as:  LIPITOR  Take 1 tablet (40 mg total) by mouth daily at 6 PM.     ferrous sulfate 325 (65 FE) MG tablet  Take 1 tablet (325 mg total) by mouth daily with breakfast.     folic acid 1 MG tablet  Commonly known as:  FOLVITE  Take 1 tablet (1 mg total) by mouth daily.     gabapentin 100 MG capsule  Commonly known as:  NEURONTIN  Take 1 capsule (100 mg total) by mouth at bedtime.     lisinopril 40 MG tablet  Commonly known as:  PRINIVIL,ZESTRIL  Take 1 tablet (40 mg total) by mouth daily.     loratadine 10 MG tablet  Commonly known as:  CLARITIN  Take 1 tablet (10 mg total) by mouth daily.     metoprolol 100 MG tablet  Commonly known as:  LOPRESSOR  Take 1 tablet (100 mg total) by mouth 2 (two) times daily with a meal.     PAIN RELIEF EXTRA STRENGTH PO  Take 2 tablets by mouth daily as needed (pain).     traMADol 50 MG tablet  Commonly known as:  ULTRAM  Take 1 tablet (50 mg total) by mouth every 6 (six) hours as needed (pain).     vitamin C 500  MG tablet  Commonly known as:  ASCORBIC ACID  Take 500 mg by mouth daily.     Vitamin D3 1000 UNITS Caps  Take 1,000 Units by mouth daily.         Discharge Instructions:  The patient is to refrain from driving, heavy lifting or strenuous activity.  May shower daily and clean incisions with soap and water.  May resume regular diet.   Follow  Up: Follow-up Information    Follow up with Grace Isaac, MD On 04/12/2014.   Specialty:  Cardiothoracic Surgery   Why:  Have a chest x-ray at 9:00, then see MD at 10:00   Contact information:   [address] [place] [address] [phone]       Follow up with [name], MD.   Specialty:  Internal Medicine   Why:  Please see your medical doctor in the next 1-2 weeks for blood pressure management   Contact information:   [address] [place] [address] [phone]       Follow up with [facility].   Why:  HH-PT/RN  arranged   Contact information:   [address] [place] [address] [phone]          Hady Niemczyk H 03/14/2014, 7:57 AM

## 2014-03-12 NOTE — Progress Notes (Signed)
CARDIAC REHAB PHASE I   PRE:  Rate/Rhythm: 76 SR  BP:  Supine: 134/62  Sitting:   Standing:    SaO2: 97 RA  MODE:  Ambulation: 312 ft   POST:  Rate/Rhythm: 80 SR  BP:  Supine:   Sitting: 143/60  Standing:    SaO2: 95 RA 1325-1355 Assisted X 1 and used walker to ambulate. Gait steady with walker, slow pace. Pt able to walk 312 feet c/o of being tired and took a couple of standing rest stops. VS stable Pt back to bed after walk with call light in reach and family present.  Rodney Langton RN 03/12/2014 1:50 PM

## 2014-03-12 NOTE — Progress Notes (Signed)
Removed EPW per MD order per hospital policy. Pacing wires intact upon removal. Patient tolerated well, VSS. Patient reminded to stay in bed for 1 hour. Will continue to monitor closely. Glade Nurse, RN

## 2014-03-12 NOTE — Progress Notes (Addendum)
       [address]       [place] [address]             [phone]          9 Days Post-Op Procedure(s) (LRB): REPAIR OF ASCENDING AORTIC DISSECTION (N/A)  Subjective: Feels a little better today. Appetite still not great, but no more loose stools.   Objective: Vital signs in last 24 hours: Patient Vitals for the past 24 hrs:  BP Temp Temp src Pulse Resp SpO2 Weight  03/12/14 0542 (!) 142/60 mmHg 98.2 F (36.8 C) Oral 69 18 100 % 197 lb (89.359 kg)  03/11/14 1935 134/67 mmHg 98.3 F (36.8 C) Oral 70 20 98 % -  03/11/14 1658 132/62 mmHg - - 77 - - -  03/11/14 1403 (!) 126/57 mmHg 98.4 F (36.9 C) Oral 73 18 99 % -   Current Weight  03/12/14 197 lb (89.359 kg)  PRE-OPERATIVE WEIGHT: 91 kg   Intake/Output from previous day: 02/28 0701 - 02/29 0700 In: 240 [P.O.:240] Out: 150 [Urine:150]    PHYSICAL EXAM:  Heart: RRR Lungs: Slightly decreased in left base Wound: Clean and dry Extremities: Minimal LE edema    Lab Results: CBC:No results for input(s): WBC, HGB, HCT, PLT in the last 72 hours. BMET:  Recent Labs  03/10/14 0339 03/11/14 0500  NA 135 135  K 3.0* 3.5  CL 98 98  CO2 29 30  GLUCOSE 100* 117*  BUN 6 7  CREATININE 0.87 0.92  CALCIUM 7.9* 8.2*    PT/INR: No results for input(s): LABPROT, INR in the last 72 hours.    Assessment/Plan: S/P Procedure(s) (LRB): REPAIR OF ASCENDING AORTIC DISSECTION (N/A)  CV- BPs look a little better today. Continue current meds.  Will d/c EPWs.  L effusion- improved some on CXR. Pt asymptomatic. Continue diuresis for now.  Hypokalemia- no new labs today. Will follow.  Continue CRPI, pulm toilet.  Possibly home in next few days if she remains stable.   LOS: 9 days    COLLINS,GINA H 03/12/2014  I have seen and examined the patient and agree with the assessment and plan as outlined.  Possibly ready for d/c home 1-2 days.  Emphasized need for close attention to BP control at home  and need to take all medications as prescribed.  Shadoe Cryan H 03/12/2014 7:57 AM

## 2014-03-12 NOTE — Progress Notes (Signed)
Physical Therapy Treatment Patient Details Name: Alexis Wheeler MRN: [id number] DOB: March 30, 1955 Today's Date: 03/12/2014    History of Present Illness Patien transferred to [facility] with acute Type 1 aortic dissection. She presented complaining of f anterior chest pain radiating to the epigastrium accompanied by numbness of left leg which started at 8 PM 2/19. S/p repair 2/22    PT Comments    Progressing towards physical therapy goals. Ambulating up to 300 feet today including trail without use of an assistive device which she tolerated well. Patient continues to require cues to maintain sternal precautions at all time. HR into 80s while ambulating. Patient will continue to benefit from skilled physical therapy services to further improve independence with functional mobility.   Follow Up Recommendations  Home health PT;Supervision for mobility/OOB     Equipment Recommendations  Rolling walker with 5" wheels;3in1 (PT)    Recommendations for Other Services OT consult     Precautions / Restrictions Precautions Precautions: Sternal Precaution Comments: precautions reviewed Restrictions Weight Bearing Restrictions: Yes Other Position/Activity Restrictions: sternal    Mobility  Bed Mobility               General bed mobility comments: Standing upon PT entering room.  Transfers Overall transfer level: Needs assistance Equipment used: Rolling walker (2 wheeled) Transfers: Sit to/from Stand Sit to Stand: Supervision         General transfer comment: Supervision for safety. Continues to require cues to maintain sternal precautions with hand placement. erfromed from lowest bed setting.  Ambulation/Gait Ambulation/Gait assistance: Min guard Ambulation Distance (Feet): 300 Feet Assistive device: Rolling walker (2 wheeled);None Gait Pattern/deviations: Step-through pattern;Decreased stride length;Antalgic;Drifts right/left;Trunk flexed Gait velocity: decreased    General Gait Details: Ambulates 150 feet with a rolling walker for support. Cues to lightly touch in order to maintain sternal precautions. After standing rest break, able to ambulate additional 150 feet without an assistive device, close guard for safety with moderate sway noted but able to self correct. Did not require physical assist.   Stairs            Wheelchair Mobility    Modified Rankin (Stroke Patients Only)       Balance                                    Cognition Arousal/Alertness: Awake/alert Behavior During Therapy: WFL for tasks assessed/performed         Memory: Decreased recall of precautions              Exercises General Exercises - Lower Extremity Ankle Circles/Pumps: AROM;Both;10 reps;Seated    General Comments General comments (skin integrity, edema, etc.): Patient was standing and leaning on UEs upon PT entering room. Pt able to verbalize sternal precautions but clearly does not follow these precautions when unsupervised. Spent time discussing importance of compliance at all times to prevent secondary complications.      Pertinent Vitals/Pain Pain Assessment: No/denies pain Pain Intervention(s): Monitored during session  SpO2 98% on room air HR 71-80s    Home Living                      Prior Function            PT Goals (current goals can now be found in the care plan section) Acute Rehab PT Goals PT Goal Formulation: With patient Time For Goal Achievement: 03/20/14 Potential  to Achieve Goals: Good Progress towards PT goals: Progressing toward goals    Frequency  Min 3X/week    PT Plan Current plan remains appropriate    Co-evaluation             End of Session Equipment Utilized During Treatment: Gait belt Activity Tolerance: Patient tolerated treatment well Patient left: with call bell/phone within reach;in chair     Time: 0950-1005 PT Time Calculation (min) (ACUTE ONLY): 15  min  Charges:  $Gait Training: 8-22 mins                    G Codes:      Ellouise Newer 2014-03-27, 10:34 AM Elayne Snare, [place]

## 2014-03-13 LAB — BASIC METABOLIC PANEL
Anion gap: 11 (ref 5–15)
BUN: 8 mg/dL (ref 6–23)
CO2: 30 mmol/L (ref 19–32)
Calcium: 8.5 mg/dL (ref 8.4–10.5)
Chloride: 95 mmol/L — ABNORMAL LOW (ref 96–112)
Creatinine, Ser: 0.89 mg/dL (ref 0.50–1.10)
GFR calc Af Amer: 81 mL/min — ABNORMAL LOW (ref >90)
GFR calc non Af Amer: 70 mL/min — ABNORMAL LOW (ref >90)
Glucose, Bld: 107 mg/dL — ABNORMAL HIGH (ref 70–99)
Potassium: 3.7 mmol/L (ref 3.5–5.1)
Sodium: 136 mmol/L (ref 135–145)

## 2014-03-13 NOTE — Progress Notes (Addendum)
CARDIAC REHAB PHASE I   PRE:  Rate/Rhythm: 69 SR  BP:  Supine:   Sitting: 134/60  Standing:    SaO2: 9 RA  MODE:  Ambulation: 350 ft   POST:  Rate/Rhythm: 78 SR  BP:  Supine:   Sitting: 138/59  Standing:    SaO2: 96 RA 1015-1050 Assisted X 1 and used walker to ambulate. Gait steady with walker. Pt tires as she walks, but was able to get her 350 feet. VS stable Pt back to recliner after walk with call light in reach. I did encourage her to watch surgery discharge video.  Rodney Langton RN 03/13/2014 10:46 AM

## 2014-03-13 NOTE — Progress Notes (Signed)
Patient complaining of back pain not relieved by Ultram. Hot pads placed and patient helped into the recliner. Patient states immediate relief. Will continue to monitor.  Domingo Dimes RN

## 2014-03-13 NOTE — Progress Notes (Addendum)
       [address]       [place] [address]             [phone]          10 Days Post-Op Procedure(s) (LRB): REPAIR OF ASCENDING AORTIC DISSECTION (N/A)  Subjective: Feels "terrible" today.  Sore in chest and weak. Appetite poor.  Objective: Vital signs in last 24 hours: Patient Vitals for the past 24 hrs:  BP Temp Temp src Pulse SpO2 Weight  03/13/14 0622 (!) 135/55 mmHg 98.6 F (37 C) Oral 83 98 % -  03/13/14 0500 - - - - - 195 lb 9.6 oz (88.724 kg)  03/12/14 1934 (!) 125/55 mmHg 98.2 F (36.8 C) Oral 66 98 % -  03/12/14 1439 124/61 mmHg 98.2 F (36.8 C) Oral 71 98 % -  03/12/14 1130 (!) 149/68 mmHg - - 70 - -  03/12/14 1115 (!) 144/61 mmHg - - 68 - -  03/12/14 1103 (!) 142/62 mmHg - - 71 - -  03/12/14 1053 (!) 139/59 mmHg - - 67 - -   Current Weight  03/13/14 195 lb 9.6 oz (88.724 kg)  PRE-OPERATIVE WEIGHT: 91 kg   Intake/Output from previous day: 02/29 0701 - 03/01 0700 In: 243 [P.O.:240; I.V.:3] Out: -     PHYSICAL EXAM:  Heart: RRR, occ PVCs Lungs: Slightly diminished BS in bases Wound: Clean and dry Extremities: Minimal LE edema    Lab Results: CBC:No results for input(s): WBC, HGB, HCT, PLT in the last 72 hours. BMET:  Recent Labs  03/11/14 0500 03/13/14 0517  NA 135 136  K 3.5 3.7  CL 98 95*  CO2 30 30  GLUCOSE 117* 107*  BUN 7 8  CREATININE 0.92 0.89  CALCIUM 8.2* 8.5    PT/INR: No results for input(s): LABPROT, INR in the last 72 hours.    Assessment/Plan: S/P Procedure(s) (LRB): REPAIR OF ASCENDING AORTIC DISSECTION (N/A)  CV- BPs generally stable.  Continue Norvasc, Lopressor, Lisinopril.  L effusion/vol overload- improving on CXR. Continue diuresis.  Hypokalemia- Replace K+.  Continue CRPI, pulm toilet.  Disp- pt states she does not feel well and does not want to go home today. Will keep in house another day and monitor BPs, etc.  HH being arranged .   LOS: 10 days    COLLINS,GINA  H 03/13/2014  I have seen and examined the patient and agree with the assessment and plan as outlined.  Feels better this afternoon.  No specific complaints. BP well controlled.  Anticipate possible d/c home soon.  Valeriano Bain H 03/13/2014 2:14 PM

## 2014-03-13 NOTE — Progress Notes (Signed)
Patient refused to walk tonight. Patient states she is really sore tonight and feeling weak. Educated on ambulating. Patient still refused. Will continue to monitor.   Domingo Dimes RN

## 2014-03-14 MED ORDER — ATORVASTATIN CALCIUM 40 MG PO TABS: 40.0000 mg | ORAL_TABLET | Freq: Every day | ORAL | Status: DC

## 2014-03-14 MED ORDER — METOPROLOL TARTRATE 100 MG PO TABS: 100.0000 mg | ORAL_TABLET | Freq: Two times a day (BID) | ORAL | Status: DC

## 2014-03-14 MED ORDER — FERROUS SULFATE 325 (65 FE) MG PO TABS: 325.0000 mg | ORAL_TABLET | Freq: Every day | ORAL | Status: DC

## 2014-03-14 MED ORDER — TRAMADOL HCL 50 MG PO TABS: 50.0000 mg | ORAL_TABLET | Freq: Four times a day (QID) | ORAL | Status: DC | PRN

## 2014-03-14 MED ORDER — ASPIRIN 81 MG PO TBEC
81.0000 mg | DELAYED_RELEASE_TABLET | Freq: Every day | ORAL | Status: DC
Start: 2014-03-14 — End: 2022-08-21

## 2014-03-14 MED ORDER — FOLIC ACID 1 MG PO TABS: 1.0000 mg | ORAL_TABLET | Freq: Every day | ORAL | Status: DC

## 2014-03-14 MED ORDER — LISINOPRIL 40 MG PO TABS: 40.0000 mg | ORAL_TABLET | Freq: Every day | ORAL | Status: DC

## 2014-03-14 NOTE — Progress Notes (Signed)
Discussed ed with pt and boyfriend. Pt still with flat affect, friend more interactive. Pt not interested in CRPII. Gave brochure. She has watched d/c video. 1020-1040 Yves Dill CES, ACSM 10:41 AM 03/14/2014

## 2014-03-14 NOTE — Progress Notes (Signed)
       [address]       [place] [address]             [phone]          11 Days Post-Op Procedure(s) (LRB): REPAIR OF ASCENDING AORTIC DISSECTION (N/A)  Subjective: Feels much better today. Ready to go home.   Objective: Vital signs in last 24 hours: Patient Vitals for the past 24 hrs:  BP Temp Temp src Pulse Resp SpO2 Weight  03/14/14 0449 116/70 mmHg 98.3 F (36.8 C) Oral 72 18 100 % 194 lb 3.6 oz (88.1 kg)  03/13/14 1959 (!) 128/59 mmHg 98.3 F (36.8 C) Oral 71 16 98 % -  03/13/14 1400 (!) 135/59 mmHg 98.2 F (36.8 C) Oral 69 18 97 % -   Current Weight  03/14/14 194 lb 3.6 oz (88.1 kg)  PRE-OPERATIVE WEIGHT: 91 kg   Intake/Output from previous day: 03/01 0701 - 03/02 0700 In: 360 [P.O.:360] Out: -     PHYSICAL EXAM:  Heart: RRR Lungs: Clear Wound: Clean and dry Extremities: No edema    Lab Results: CBC:No results for input(s): WBC, HGB, HCT, PLT in the last 72 hours. BMET:  Recent Labs  03/13/14 0517  NA 136  K 3.7  CL 95*  CO2 30  GLUCOSE 107*  BUN 8  CREATININE 0.89  CALCIUM 8.5    PT/INR: No results for input(s): LABPROT, INR in the last 72 hours.    Assessment/Plan: S/P Procedure(s) (LRB): REPAIR OF ASCENDING AORTIC DISSECTION (N/A) Overall feeling much better.  BPs well controlled. Plan d/c home today- instructions reviewed with patient.   LOS: 11 days    Talynn Lebon H 03/14/2014

## 2014-03-14 NOTE — Progress Notes (Signed)
Chest tube sutures removed and steri strips applied. Every other staple removed as directed and per protocol. Patient tolerated well. Denied pain and/or questions at this time. RN will continue to monitor.

## 2014-03-15 DIAGNOSIS — I7100 Dissection of unspecified site of aorta: Secondary | ICD-10-CM | POA: Diagnosis not present

## 2014-03-22 ENCOUNTER — Emergency Department (HOSPITAL_COMMUNITY): Payer: BLUE CROSS/BLUE SHIELD

## 2014-03-22 ENCOUNTER — Encounter (HOSPITAL_COMMUNITY): Payer: Self-pay | Admitting: Emergency Medicine

## 2014-03-22 ENCOUNTER — Emergency Department (HOSPITAL_COMMUNITY)
Admission: EM | Admit: 2014-03-22 | Discharge: 2014-03-22 | Disposition: A | Discharge status: Home / Self Care | Payer: BLUE CROSS/BLUE SHIELD | Attending: Emergency Medicine | Admitting: Emergency Medicine

## 2014-03-22 DIAGNOSIS — Z9049 Acquired absence of other specified parts of digestive tract: Secondary | ICD-10-CM | POA: Insufficient documentation

## 2014-03-22 DIAGNOSIS — E785 Hyperlipidemia, unspecified: Secondary | ICD-10-CM | POA: Insufficient documentation

## 2014-03-22 DIAGNOSIS — Z8739 Personal history of other diseases of the musculoskeletal system and connective tissue: Secondary | ICD-10-CM | POA: Diagnosis not present

## 2014-03-22 DIAGNOSIS — R0789 Other chest pain: Secondary | ICD-10-CM | POA: Insufficient documentation

## 2014-03-22 DIAGNOSIS — I10 Essential (primary) hypertension: Secondary | ICD-10-CM | POA: Diagnosis not present

## 2014-03-22 DIAGNOSIS — G8918 Other acute postprocedural pain: Secondary | ICD-10-CM | POA: Diagnosis not present

## 2014-03-22 DIAGNOSIS — Z79899 Other long term (current) drug therapy: Secondary | ICD-10-CM | POA: Insufficient documentation

## 2014-03-22 DIAGNOSIS — K219 Gastro-esophageal reflux disease without esophagitis: Secondary | ICD-10-CM | POA: Insufficient documentation

## 2014-03-22 DIAGNOSIS — Z7982 Long term (current) use of aspirin: Secondary | ICD-10-CM | POA: Diagnosis not present

## 2014-03-22 DIAGNOSIS — R079 Chest pain, unspecified: Secondary | ICD-10-CM | POA: Diagnosis present

## 2014-03-22 LAB — CBC
HCT: 29.2 % — ABNORMAL LOW (ref 36.0–46.0)
Hemoglobin: 9.4 g/dL — ABNORMAL LOW (ref 12.0–15.0)
MCH: 22.7 pg — ABNORMAL LOW (ref 26.0–34.0)
MCHC: 32.2 g/dL (ref 30.0–36.0)
MCV: 70.4 fL — ABNORMAL LOW (ref 78.0–100.0)
Platelets: 351 10*3/uL (ref 150–400)
RBC: 4.15 MIL/uL (ref 3.87–5.11)
RDW: 16.3 % — ABNORMAL HIGH (ref 11.5–15.5)
WBC: 6.8 10*3/uL (ref 4.0–10.5)

## 2014-03-22 LAB — BASIC METABOLIC PANEL
Anion gap: 10 (ref 5–15)
BUN: 8 mg/dL (ref 6–23)
CO2: 25 mmol/L (ref 19–32)
Calcium: 8.6 mg/dL (ref 8.4–10.5)
Chloride: 105 mmol/L (ref 96–112)
Creatinine, Ser: 0.99 mg/dL (ref 0.50–1.10)
GFR calc Af Amer: 71 mL/min — ABNORMAL LOW (ref >90)
GFR calc non Af Amer: 61 mL/min — ABNORMAL LOW (ref >90)
Glucose, Bld: 101 mg/dL — ABNORMAL HIGH (ref 70–99)
Potassium: 3.9 mmol/L (ref 3.5–5.1)
Sodium: 140 mmol/L (ref 135–145)

## 2014-03-22 LAB — I-STAT CHEM 8, ED
BUN: 9 mg/dL (ref 6–23)
Calcium, Ion: 1.16 mmol/L (ref 1.12–1.23)
Chloride: 102 mmol/L (ref 96–112)
Creatinine, Ser: 0.9 mg/dL (ref 0.50–1.10)
Glucose, Bld: 101 mg/dL — ABNORMAL HIGH (ref 70–99)
HCT: 32 % — ABNORMAL LOW (ref 36.0–46.0)
Hemoglobin: 10.9 g/dL — ABNORMAL LOW (ref 12.0–15.0)
Potassium: 3.8 mmol/L (ref 3.5–5.1)
Sodium: 140 mmol/L (ref 135–145)
TCO2: 26 mmol/L (ref 0–100)

## 2014-03-22 LAB — I-STAT TROPONIN, ED: Troponin i, poc: 0 ng/mL (ref 0.00–0.08)

## 2014-03-22 MED ORDER — HYDROCODONE-ACETAMINOPHEN 5-325 MG PO TABS
1.0000 | ORAL_TABLET | Freq: Once | ORAL | Status: AC
  Administered 2014-03-22: 1 via ORAL
  Filled 2014-03-22: qty 1

## 2014-03-22 MED ORDER — HYDROCODONE-ACETAMINOPHEN 5-325 MG PO TABS: 1.0000 | ORAL_TABLET | Freq: Four times a day (QID) | ORAL | Status: DC | PRN

## 2014-03-22 MED ORDER — OMEPRAZOLE 20 MG PO CPDR: 20.0000 mg | DELAYED_RELEASE_CAPSULE | Freq: Every day | ORAL | Status: DC

## 2014-03-22 NOTE — ED Provider Notes (Signed)
CSN: [id number]     Arrival date & time 03/22/14  1651 History   First MD Initiated Contact with Patient 03/22/14 1822     Chief Complaint  Patient presents with  . Post-op Problem     (Consider location/radiation/quality/duration/timing/severity/associated sxs/prior Treatment) Patient is a 59 y.o. female presenting with chest pain. The history is provided by the patient and medical records.  Chest Pain Chest pain location: paraternal bilaterally. Pain quality: sharp   Pain radiates to:  Does not radiate Pain radiates to the back: no   Pain severity:  Moderate Onset quality:  Gradual Timing:  Intermittent Progression:  Waxing and waning Chronicity:  Chronic Context comment:  Recent sternotomy for aortic dissection Relieved by: ibuprofen. Worsened by:  Movement Associated symptoms: no abdominal pain, no cough, no fever, no heartburn, no nausea, no shortness of breath and no weakness   Risk factors: no coronary artery disease, no diabetes mellitus, no high cholesterol and no hypertension     Past Medical History  Diagnosis Date  . Morbid obesity   . HEMATURIA UNSPECIFIED   . GERD   . MYOSITIS   . HYPERTENSION   . HYPERLIPIDEMIA   . Vertigo    Past Surgical History  Procedure Laterality Date  . Cholecystectomy  2001  . Abdominal hysterectomy  1983  . Replacement ascending aorta N/A 03/03/2014    Procedure: REPAIR OF ASCENDING AORTIC DISSECTION;  Surgeon: Grace Isaac, MD;  Location: [facility];  Service: Open Heart Surgery;  Laterality: N/A;   Family History  Problem Relation Age of Onset  . Breast cancer Mother 25  . Breast cancer Sister   . Throat cancer Brother     1 bro living  . Seizures Neg Hx   . Aortic dissection Mother 49  . Hypertension Mother   . Hypertension Father   . Hypertension Sister   . Hypertension Daughter    History  Substance Use Topics  . Smoking status: Never Smoker   . 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 Alcohol Use: No   OB History    No data available     Review of Systems  Constitutional: Negative for fever.  Respiratory: Negative for cough, chest tightness and shortness of breath.   Cardiovascular: Positive for chest pain.  Gastrointestinal: Negative for heartburn, nausea and abdominal pain.  Neurological: Negative for weakness.  All other systems reviewed and are negative.     Allergies  Ibuprofen  Home Medications   Prior to Admission medications   Medication Sig Start Date End Date Taking? Authorizing Provider  Acetaminophen (PAIN RELIEF EXTRA STRENGTH PO) Take 2 tablets by mouth daily as needed (pain).    Historical Provider, MD  amLODipine (NORVASC) 10 MG tablet Take 1 tablet (10 mg total) by mouth daily. 12/16/13   Tammi Sou, MD  amLODipine (NORVASC) 5 MG tablet Take 5 mg by mouth daily. 01/26/14   Historical Provider, MD  Ascorbic Acid (VITAMIN C) 500 MG tablet Take 500 mg by mouth daily.      Historical Provider, MD  aspirin EC 81 MG EC tablet Take 1 tablet (81 mg total) by mouth daily. 03/14/14   Coolidge Breeze, PA-C  atorvastatin (LIPITOR) 40 MG tablet Take 1 tablet (40 mg total) by mouth daily at 6 PM. 03/14/14   Coolidge Breeze, PA-C  Cholecalciferol (VITAMIN D3)  1000 UNITS CAPS Take 1,000 Units by mouth daily.      Historical Provider, MD  ferrous sulfate 325 (65 FE) MG tablet Take 1 tablet (325 mg total) by mouth daily with breakfast. 03/14/14   Coolidge Breeze, PA-C  folic acid (FOLVITE) 1 MG tablet Take 1 tablet (1 mg total) by mouth daily. 03/14/14   Coolidge Breeze, PA-C  gabapentin (NEURONTIN) 100 MG capsule Take 1 capsule (100 mg total) by mouth at bedtime. Patient not taking: Reported on 03/02/2014 01/26/14   Lyndal Pulley, DO  HYDROcodone-acetaminophen (NORCO/VICODIN) 5-325 MG per tablet Take 1 tablet by mouth every 6 (six) hours as needed for moderate pain. 03/22/14   Larence Penning, MD   lisinopril (PRINIVIL,ZESTRIL) 40 MG tablet Take 1 tablet (40 mg total) by mouth daily. 03/14/14   Coolidge Breeze, PA-C  loratadine (CLARITIN) 10 MG tablet Take 1 tablet (10 mg total) by mouth daily. Patient not taking: Reported on 03/02/2014 08/29/12   Rowe Clack, MD  metoprolol (LOPRESSOR) 100 MG tablet Take 1 tablet (100 mg total) by mouth 2 (two) times daily with a meal. 03/14/14   Coolidge Breeze, PA-C  omeprazole (PRILOSEC) 20 MG capsule Take 1 capsule (20 mg total) by mouth daily. 03/22/14   Jola Schmidt, MD  traMADol (ULTRAM) 50 MG tablet Take 1 tablet (50 mg total) by mouth every 6 (six) hours as needed (pain). 03/14/14   Coolidge Breeze, PA-C  triamterene-hydrochlorothiazide (MAXZIDE-25) 37.5-25 MG per tablet Take by mouth daily. 01/26/14   Historical Provider, MD   BP 164/80 mmHg  Pulse 73  Temp(Src) 98.8 F (37.1 C) (Oral)  Resp 18  Wt 185 lb (83.915 kg)  SpO2 100% Physical Exam  Constitutional: She appears well-developed and well-nourished. No distress.  HENT:  Head: Normocephalic and atraumatic.  Mouth/Throat: No oropharyngeal exudate.  Eyes: EOM are normal. Pupils are equal, round, and reactive to light.  Cardiovascular: Normal rate, regular rhythm and normal heart sounds.   No murmur heard. Pulmonary/Chest: Effort normal and breath sounds normal. No respiratory distress.  Sternotomy scar healing well. Tender bilaterally over the para-sternal areas. No wound dehiscence.  Abdominal: Soft. Bowel sounds are normal. She exhibits no distension. There is no tenderness.  Musculoskeletal: Normal range of motion. She exhibits no edema or tenderness.  Skin: Skin is warm and dry. No rash noted. She is not diaphoretic.  Psychiatric: She has a normal mood and affect. Her behavior is normal. Judgment and thought content normal.    ED Course  Procedures (including critical care time) Labs Review Labs Reviewed  CBC - Abnormal; Notable for the following:    Hemoglobin 9.4 (*)    HCT  29.2 (*)    MCV 70.4 (*)    MCH 22.7 (*)    RDW 16.3 (*)    All other components within normal limits  BASIC METABOLIC PANEL - Abnormal; Notable for the following:    Glucose, Bld 101 (*)    GFR calc non Af Amer 61 (*)    GFR calc Af Amer 71 (*)    All other components within normal limits  I-STAT CHEM 8, ED - Abnormal; Notable for the following:    Glucose, Bld 101 (*)    Hemoglobin 10.9 (*)    HCT 32.0 (*)    All other components within normal limits  I-STAT TROPOININ, ED    Imaging Review Dg Chest 2 View  03/22/2014   CLINICAL DATA:  Chest pain radiating into back.  History of bypass surgery 3 weeks ago. Initial encounter.  EXAM: CHEST  2 VIEW  COMPARISON:  03/12/2014 and 03/08/2014 radiographs.  FINDINGS: The heart size and mediastinal contours are stable status post median sternotomy. There is slightly improved aeration of the left lung base with residual atelectasis and a small left pleural effusion. There is no significant right pleural effusion. The right lung is clear. There is no pneumothorax or edema.  IMPRESSION: No acute findings demonstrated. Improved left basilar aeration and left pleural effusion.   Electronically Signed   By: Richardean Sale M.D.   On: 03/22/2014 20:14     EKG Interpretation   Date/Time:  Thursday March 22 2014 17:20:28 EST Ventricular Rate:  75 PR Interval:  178 QRS Duration: 74 QT Interval:  434 QTC Calculation: 484 R Axis:   63 Text Interpretation:  Normal sinus rhythm Nonspecific T wave abnormality  Prolonged QT Abnormal ECG No significant change was found Confirmed by  CAMPOS  MD, Lennette Bihari ([id number]) on 03/22/2014 8:15:00 PM      MDM   Final diagnoses:  Chest wall pain following surgery    GENECIS VELEY is 59 y.o. female who presents with chest pain since sternotomy and aortic dissection repair one month ago. Pain is para-sternal and worsens with palpation, movement. Improved with ibuprofen.  Lower concern for ACS, as this has been  present since surgery and is described as sharp and para-sternal. No other cardiac characteristics of pain. Most likely is post-operative pain. The only thing changed over the last month is she felt a little more "worn out" today than she has over the past month. EKG is unremarkable and unchanged. CXR without widened mediastinum, pneumothorax. Basic labs and troponin unremarkable. Will only get single troponin give duration of symptoms. Increased pain control given and will follow up with CT surgeon and PCP. ED return precautions for chest pain discussed. Stable for d/c.    Larence Penning, MD 03/22/14 2585  Jola Schmidt, MD 03/22/14 [phone]

## 2014-03-22 NOTE — ED Notes (Signed)
Patient transported to X-ray 

## 2014-03-22 NOTE — ED Notes (Signed)
NAD noted. Pt denies any other symptoms. Pt discharged by wheelchair

## 2014-03-22 NOTE — ED Notes (Signed)
Pt c/o of pain in chest radiating from sternum to both right and left sides, describes as "sharp pain". Pt took ibuprofen for pain and it is relieved for about 30 minutes and comes back. Last dose around 1630. Pain began a couple days ago, but today it became "nagging and hurting". Pt has a healing sternal incision. Pt had AAA surgery emergently February 19th with Dr. Servando Snare. Pt reports 5/10 pain right now. Denies nausea/vomiting. Last PO intake at 1430 with ibuprofen. Denies SOB at this time.

## 2014-03-23 ENCOUNTER — Telehealth: Payer: Self-pay | Admitting: Cardiovascular Disease

## 2014-03-23 NOTE — Telephone Encounter (Signed)
Pt states she went to ED last night because of chest pain, she was told she had reflux.  Pt recently discharged from [facility] after aortic dissection repair by Dr Servando Snare. Discharge summary 03/14/14 indicates pt should schedule followup appt with Dr Asa Lente for her BP. Pt states she has not seen Dr Asa Lente since discharge from hospital but will call to schedule an appt.   Pt's sister had appt with Dr Servando Snare today and she went with her sister to  appt. Pt was concerned about her BP elevation the last few days and discussed with Dr Servando Snare. Pt states BP today 158/88 before meds.  Dr Servando Snare advised pt to schedule followup appt with Dr Angelena Form (he had seen pt during recent hospitalization) although there are no notes in hospital discharge summary to do this.  Pt advised I will forward to Dr Angelena Form for review.

## 2014-03-23 NOTE — Telephone Encounter (Signed)
New Message  Pt called to sched f/u w/ [facility].  Pt is not established w/ our clinic;  Per pt- [facility] was involved in surgery on 2/19 and was told by Dr. Servando Snare to f/u w/ Angelena Form due to admission to ED on 3/10. No notes in d/x summary about f/u w/ [facility]. Pt wanted to know what to do going forward. Please call back and discuss.

## 2014-03-26 NOTE — Telephone Encounter (Signed)
Will forward to scheduling to contact the patient with appointment.

## 2014-03-26 NOTE — Telephone Encounter (Signed)
Her follow up will be with Dr. Ellyn Hack. Can we arrange follow up in the [facility] office with him or office APP to discuss her medications, BP? Gerald Stabs

## 2014-03-27 ENCOUNTER — Ambulatory Visit: Payer: BLUE CROSS/BLUE SHIELD | Admitting: Internal Medicine

## 2014-03-27 DIAGNOSIS — R4689 Other symptoms and signs involving appearance and behavior: Secondary | ICD-10-CM | POA: Insufficient documentation

## 2014-03-28 ENCOUNTER — Ambulatory Visit (INDEPENDENT_AMBULATORY_CARE_PROVIDER_SITE_OTHER): Payer: BLUE CROSS/BLUE SHIELD | Admitting: Nurse Practitioner

## 2014-03-28 ENCOUNTER — Encounter: Payer: Self-pay | Admitting: Nurse Practitioner

## 2014-03-28 ENCOUNTER — Ambulatory Visit
Admission: RE | Admit: 2014-03-28 | Discharge: 2014-03-28 | Disposition: A | Discharge status: Home / Self Care | Payer: BLUE CROSS/BLUE SHIELD | Source: Ambulatory Visit | Attending: Nurse Practitioner | Admitting: Nurse Practitioner

## 2014-03-28 VITALS — BP 190/120 | HR 76 | Ht 64.0 in | Wt 187.8 lb

## 2014-03-28 DIAGNOSIS — I10 Essential (primary) hypertension: Secondary | ICD-10-CM

## 2014-03-28 DIAGNOSIS — Z9889 Other specified postprocedural states: Secondary | ICD-10-CM

## 2014-03-28 LAB — CBC
HCT: 31.4 % — ABNORMAL LOW (ref 36.0–46.0)
Hemoglobin: 10.1 g/dL — ABNORMAL LOW (ref 12.0–15.0)
MCHC: 32.1 g/dL (ref 30.0–36.0)
MCV: 69.9 fl — ABNORMAL LOW (ref 78.0–100.0)
Platelets: 353 10*3/uL (ref 150.0–400.0)
RBC: 4.49 Mil/uL (ref 3.87–5.11)
RDW: 16.5 % — ABNORMAL HIGH (ref 11.5–15.5)
WBC: 6.9 10*3/uL (ref 4.0–10.5)

## 2014-03-28 LAB — BASIC METABOLIC PANEL
BUN: 11 mg/dL (ref 6–23)
CO2: 32 mEq/L (ref 19–32)
Calcium: 9.4 mg/dL (ref 8.4–10.5)
Chloride: 103 mEq/L (ref 96–112)
Creatinine, Ser: 0.95 mg/dL (ref 0.40–1.20)
GFR: 77.29 mL/min (ref >60.00)
Glucose, Bld: 105 mg/dL — ABNORMAL HIGH (ref 70–99)
Potassium: 3.6 mEq/L (ref 3.5–5.1)
Sodium: 138 mEq/L (ref 135–145)

## 2014-03-28 MED ORDER — CLONIDINE HCL 0.1 MG PO TABS
0.1000 mg | ORAL_TABLET | Freq: Once | ORAL | Status: AC
  Administered 2014-03-28: 0.1 mg via ORAL

## 2014-03-28 MED ORDER — AMLODIPINE BESYLATE 10 MG PO TABS: 10.0000 mg | ORAL_TABLET | Freq: Every day | ORAL | Status: DC

## 2014-03-28 NOTE — Patient Instructions (Addendum)
We will be checking the following labs today BMET and CBC  See me in one week  Continue to monitor your blood pressure at home  Stay on your current medicines   Restart Norvasc 10 mg a day - start this today  Keep restricting your salt  Please go to [facility] [place] on the first floor for a chest Xray - you may walk in.   Call the [facility] office at [phone] if you have any questions, problems or concerns.

## 2014-03-28 NOTE — Progress Notes (Addendum)
CARDIOLOGY OFFICE NOTE  Date:  03/28/2014    Alexis Wheeler Date of Birth: 11/05/1955 Medical Record #[id number]  PCP:  [name], MD  Cardiologist:  Ellyn Hack    Chief Complaint  Patient presents with  . Post ascending aortic dissection    Post hospital visit - seen for Dr. Ellyn Hack  . Hypertension    History of Present Illness: Alexis Wheeler is a 59 y.o. female who presents today for a post hospital visit. She is seen for Dr. Ellyn Hack. She has a history of HTN and no past cardiac issues otherwise.   She presented to the ER at [facility] almost a month ago - having chest pain, SOB and nausea. 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 A cardiac surgical consultation was requested and Dr. Servando Snare saw the patient and reviewed her films. The patient was transferred urgently to [facility] for surgical repair - replacement of ascending aorta with resuspension of aortic valve with 30 mm Hemashield Platinum graft with right axillary artery cannulation and hypothermic circulatory arrest.   An intraoperative vascular surgery consult was obtained for evaluation of the transient ischemia of her left lower extremity. Dr. Scot Dock saw the patient and reviewed her films and felt that she had reperfused her common femoral artery and would not need further surgical intervention.   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 This was monitored and treated conservatively. Creatinine peaked at 1.63 and then trended down to baseline.   She was hypertensive postoperatively, and initially required a nitrogycerin drip. This was weaned and discontinued on postop day 2 and she was  started on Lopressor and a Catapres patch. Cardiology was consulted to assist with management. Catapres was discontinued and she was restarted on Norvasc. A 2D echo was performed which revealed mild LV systolic dysfunction with EF 40-45%. Once her renal function stabilized, she was restarted on an ACE-I. She had had a mild postop anemia which has not required transfusion and for which she was started on oral iron. She had a left pleural effusion on chest x-ray which improved with diuresis. She was discharged on 03/12/2014.  Comes back today. Here with her daughter. She feels pretty puny. She is "extremely tired". BP is up. Sometimes gets short of breath. Weight is going down. Appetite comes and goes. Bowels are working. Sometimes dizzy. Hard to sleep. Not walking very much. No real pain. Little headache.   Her medicines that she is taking does NOT match her discharge - she is not on Norvasc - she tells me this was stopped - I cannot see where it was stopped - she was on this pre op along with Maxzide.   Past Medical History  Diagnosis Date  . Morbid obesity   . HEMATURIA UNSPECIFIED   . GERD   . MYOSITIS   . HYPERTENSION   . HYPERLIPIDEMIA   . Vertigo     Past Surgical History  Procedure Laterality Date  . Cholecystectomy  2001  . Abdominal hysterectomy  1983  . Replacement ascending aorta N/A 03/03/2014    Procedure: REPAIR OF ASCENDING AORTIC DISSECTION;  Surgeon: Grace Isaac, MD;  Location: [facility];  Service:  Open Heart Surgery;  Laterality: N/A;     Medications: Current Outpatient Prescriptions  Medication Sig Dispense Refill  . aspirin EC 81 MG EC tablet Take 1 tablet (81 mg total) by mouth daily.    [name] atorvastatin (LIPITOR) 40 MG tablet Take 1 tablet (40 mg total) by mouth daily at 6 PM. 30 tablet 1  . Cholecalciferol (VITAMIN D3) 1000 UNITS CAPS Take 1,000 Units by mouth daily.      . ferrous sulfate 325 (65 FE) MG tablet Take 1 tablet (325 mg total) by mouth daily with  breakfast. 30 tablet 1  . folic acid (FOLVITE) 1 MG tablet Take 1 tablet (1 mg total) by mouth daily. 30 tablet 1  . lisinopril (PRINIVIL,ZESTRIL) 40 MG tablet Take 1 tablet (40 mg total) by mouth daily. 30 tablet 1  . metoprolol (LOPRESSOR) 100 MG tablet Take 1 tablet (100 mg total) by mouth 2 (two) times daily with a meal. 60 tablet 1  . traMADol (ULTRAM) 50 MG tablet Take 1 tablet (50 mg total) by mouth every 6 (six) hours as needed (pain). 40 tablet 0   No current facility-administered medications for this visit.    Allergies: Allergies  Allergen Reactions  . Ibuprofen     REACTION: Upset GI (only motrin brand)    Social History: The patient  reports that she has never smoked. She does not have any smokeless tobacco history on file. She reports that she does not drink alcohol or use illicit drugs.   Family History: The patient's family history includes Aortic dissection (age of onset: 62) in her mother; Breast cancer in her sister; Breast cancer (age of onset: 85) in her mother; Hypertension in her daughter, father, mother, and sister; Throat cancer in her brother. There is no history of Seizures.   Review of Systems: Please see the history of present illness.   Otherwise, the review of systems is positive for crying spells.   All other systems are reviewed and negative.   Physical Exam: VS:  BP 190/120 mmHg  Pulse 76  Ht 5\' 4"  (1.626 m)  Wt 187 lb 12.8 oz (85.186 kg)  BMI 32.22 kg/m2 .  BMI Body mass index is 32.22 kg/(m^2).  Wt Readings from Last 3 Encounters:  03/28/14 187 lb 12.8 oz (85.186 kg)  03/22/14 185 lb (83.915 kg)  03/14/14 194 lb 3.6 oz (88.1 kg)    General: Pleasant. Well developed, well nourished and in no acute distress.  HEENT: Normal. Neck: Supple, no JVD, carotid bruits, or masses noted.  Cardiac: Regular rate and rhythm. No murmurs, rubs, or gallops. No edema. Sternal incision and right axillary incision looks ok.  Respiratory:  Lungs with decreased  breath sounds bilaterally with normal work of breathing.  GI: Soft and nontender.  MS: No deformity or atrophy. Gait and ROM intact. Distal pulses intact. Feet are warm to touch.  Skin: Warm and dry. Color is normal.  Neuro:  Strength and sensation are intact and no gross focal deficits noted.  Psych: Alert, appropriate and with normal affect.   LABORATORY DATA:  EKG:  EKG is ordered today. This demonstrates NSR with nonspecific T wave changes.  Lab Results  Component Value Date   WBC 6.8 03/22/2014   HGB 10.9* 03/22/2014   HCT 32.0* 03/22/2014   PLT 351 03/22/2014   GLUCOSE 101* 03/22/2014   CHOL 87 03/06/2014   TRIG 108 03/06/2014   HDL 27* 03/06/2014   LDLCALC 38 03/06/2014   ALT 20 03/02/2014  AST 31 03/02/2014   NA 140 03/22/2014   K 3.8 03/22/2014   CL 102 03/22/2014   CREATININE 0.90 03/22/2014   BUN 9 03/22/2014   CO2 25 03/22/2014   TSH 0.64 03/11/2012   INR 0.96 03/03/2014   HGBA1C 5.8* 03/07/2014    BNP (last 3 results) No results for input(s): BNP in the last 8760 hours.  ProBNP (last 3 results) No results for input(s): PROBNP in the last 8760 hours.   Other Studies Reviewed Today:  Post opEcho Study Conclusions from 03/06/2014  - Left ventricle: The cavity size was normal. Wall thickness was normal. Systolic function was mildly to moderately reduced. The estimated ejection fraction was in the range of 40% to 45%. Moderate hypokinesis of the basalinferolateral myocardium. - Aortic valve: There was trivial regurgitation. Valve area (VTI): 2.42 cm^2. Valve area (Vmax): 2.19 cm^2. Valve area (Vmean): 2.08 cm^2. - Pulmonary arteries: Systolic pressure was moderately increased. PA peak pressure: 46 mm Hg (S).  Assessment/Plan: 1. 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 I think she just needs a little time. BP not at goal. Needs labs rechecked. Sending for her CXR today too.   2. HTN -  BP is 190/124 in the left  arm and 180/120 in the right. She was given 0.1 mg of Clonidine here in the office. Will restart Norvasc. I suspect a lot of her fatigue is from the Lopressor but I do not want to stop until BP starts coming down. She will need follow up in one week.   3. Mild LV dysfunction - on ACE and beta blocker  Current medicines are reviewed with the patient today.  The patient does not have concerns regarding medicines other than what has been noted above.  The following changes have been made:  See above.  Labs/ tests ordered today include:    Orders Placed This Encounter  Procedures  . Basic metabolic panel  . CBC  . EKG 12-Lead     Disposition:   FU with me in 1 week  Patient is agreeable to this plan and will call if any problems develop in the interim.   Signed: Burtis Junes, RN, ANP-C 03/28/2014 11:32 AM  [facility] [address] [place],   [address] Phone: [phone] Fax: [phone]        Addendum: BP down to 170/98 45 minutes after Clonidine given. Will send on for her CXR and then home. Will start her Norvasc. See back in one week.

## 2014-03-28 NOTE — Addendum Note (Signed)
Addended by: Burtis Junes on: 03/28/2014 11:43 AM   Modules accepted: Orders, SmartSet

## 2014-03-28 NOTE — Addendum Note (Signed)
Addended by: Burtis Junes on: 03/28/2014 11:49 AM   Modules accepted: Miquel Dunn

## 2014-03-28 NOTE — Addendum Note (Signed)
Addended by: Burtis Junes on: 03/28/2014 12:19 PM   Modules accepted: Miquel Dunn

## 2014-03-29 ENCOUNTER — Ambulatory Visit: Payer: BLUE CROSS/BLUE SHIELD | Admitting: Cardiology

## 2014-04-04 ENCOUNTER — Encounter: Payer: Self-pay | Admitting: Nurse Practitioner

## 2014-04-04 ENCOUNTER — Ambulatory Visit (INDEPENDENT_AMBULATORY_CARE_PROVIDER_SITE_OTHER): Payer: BLUE CROSS/BLUE SHIELD | Admitting: Nurse Practitioner

## 2014-04-04 VITALS — BP 150/80 | HR 80 | Ht 64.0 in | Wt 186.0 lb

## 2014-04-04 DIAGNOSIS — I10 Essential (primary) hypertension: Secondary | ICD-10-CM

## 2014-04-04 LAB — CBC
HCT: 33.9 % — ABNORMAL LOW (ref 36.0–46.0)
Hemoglobin: 10.8 g/dL — ABNORMAL LOW (ref 12.0–15.0)
MCHC: 31.9 g/dL (ref 30.0–36.0)
MCV: 69.2 fl — ABNORMAL LOW (ref 78.0–100.0)
Platelets: 322 10*3/uL (ref 150.0–400.0)
RBC: 4.9 Mil/uL (ref 3.87–5.11)
RDW: 16.9 % — ABNORMAL HIGH (ref 11.5–15.5)
WBC: 7.2 10*3/uL (ref 4.0–10.5)

## 2014-04-04 LAB — BASIC METABOLIC PANEL
BUN: 10 mg/dL (ref 6–23)
CO2: 30 mEq/L (ref 19–32)
Calcium: 9.1 mg/dL (ref 8.4–10.5)
Chloride: 103 mEq/L (ref 96–112)
Creatinine, Ser: 1 mg/dL (ref 0.40–1.20)
GFR: 72.85 mL/min (ref >60.00)
Glucose, Bld: 102 mg/dL — ABNORMAL HIGH (ref 70–99)
Potassium: 3.5 mEq/L (ref 3.5–5.1)
Sodium: 139 mEq/L (ref 135–145)

## 2014-04-04 MED ORDER — METOPROLOL TARTRATE 100 MG PO TABS: 50.0000 mg | ORAL_TABLET | Freq: Two times a day (BID) | ORAL | Status: DC

## 2014-04-04 MED ORDER — HYDROCHLOROTHIAZIDE 25 MG PO TABS: 25.0000 mg | ORAL_TABLET | Freq: Every day | ORAL | Status: DC

## 2014-04-04 NOTE — Patient Instructions (Addendum)
We will be checking the following labs today BMET & CBC  Stay on your current medicines BUT    I am decreasing the metoprolol to just 1/2 pill (50mg ) twice a day   I am adding HCTZ 25 mg daily - this is at your drug store   Laura to try Tylenol pm  See me in 2 weeks  Keep a check on your BP for me.  Call the [place] office at [phone] if you have any questions, problems or concerns.

## 2014-04-04 NOTE — Progress Notes (Signed)
CARDIOLOGY OFFICE NOTE  Date:  04/04/2014    Alexis Wheeler Date of Birth: Aug 14, 1955 Medical Record #[id number]  PCP:  [name], MD  Cardiologist:  Ellyn Hack    Chief Complaint  Patient presents with  . Hypertension    Follow up visit/one week check - seen for Dr. Ellyn Hack.     History of Present Illness: Alexis Wheeler is a 59 y.o. female who presents today for a one week check. She is seen for Dr. Ellyn Hack. She has a history of HTN and no prior cardiac issues otherwise.   She presented to the ER at [facility] over a month ago - having chest pain, SOB and nausea. 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 A cardiac surgical consultation was requested and Dr. Servando Snare saw the patient and reviewed her films. The patient was transferred urgently to [facility] for surgical repair - replacement of ascending aorta with resuspension of aortic valve with 30 mm Hemashield Platinum graft with right axillary artery cannulation and hypothermic circulatory arrest.   An intraoperative vascular surgery consult was obtained for evaluation of the transient ischemia of her left lower extremity. Dr. Scot Dock saw the patient and reviewed her films and felt that she had reperfused her common femoral artery and would not need further surgical intervention.   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 This was monitored and treated conservatively. Creatinine peaked at 1.63 and then trended down to baseline.   She was hypertensive postoperatively, and initially required a nitrogycerin drip. This was weaned and discontinued on postop day 2 and she was started on Lopressor and a  Catapres patch. Cardiology was consulted to assist with management. Catapres was discontinued and she was restarted on Norvasc. A 2D echo was performed which revealed mild LV systolic dysfunction with EF 40-45%. Once her renal function stabilized, she was restarted on an ACE-I. She had had a mild postop anemia which has not required transfusion and for which she was started on oral iron. She had a left pleural effusion on chest x-ray which improved with diuresis. She was discharged on 03/12/2014.  I saw her a week ago - was having major BP issues - medicines were all mixed up - she was not on Norvasc - she told me this was stopped - I could not see where it was stopped -. We got that straightened out. Slow progress but overall felt to be doing ok.   Comes back today. Here alone today. Had someone drive her. BP is doing better - not as high as before. She remains pretty fatigued with no energy. She is concerned because her hands "do not look like me". She is on iron - no constipation. Not short of breath. Less headaches. No chest pain. No swelling. Doing better with low salt diet.   Past Medical History  Diagnosis Date  . Morbid obesity   . HEMATURIA UNSPECIFIED   . GERD   . MYOSITIS   . HYPERTENSION   . HYPERLIPIDEMIA   . Vertigo     Past Surgical History  Procedure Laterality Date  . Cholecystectomy  2001  . Abdominal hysterectomy  1983  . Replacement ascending  aorta N/A 03/03/2014    Procedure: REPAIR OF ASCENDING AORTIC DISSECTION;  Surgeon: Grace Isaac, MD;  Location: [place];  Service: Open Heart Surgery;  Laterality: N/A;     Medications: Current Outpatient Prescriptions  Medication Sig Dispense Refill  . amLODipine (NORVASC) 10 MG tablet Take 1 tablet (10 mg total) by mouth daily. 180 tablet 3  . aspirin EC 81 MG EC tablet Take 1 tablet (81 mg total) by mouth daily.    [name] atorvastatin (LIPITOR) 40 MG tablet Take 1 tablet (40 mg total) by mouth daily at 6 PM. 30 tablet 1  .  Cholecalciferol (VITAMIN D3) 1000 UNITS CAPS Take 1,000 Units by mouth daily.      . ferrous sulfate 325 (65 FE) MG tablet Take 1 tablet (325 mg total) by mouth daily with breakfast. 30 tablet 1  . folic acid (FOLVITE) 1 MG tablet Take 1 tablet (1 mg total) by mouth daily. 30 tablet 1  . hydrochlorothiazide (HYDRODIURIL) 25 MG tablet Take 1 tablet (25 mg total) by mouth daily. 90 tablet 3  . lisinopril (PRINIVIL,ZESTRIL) 40 MG tablet Take 1 tablet (40 mg total) by mouth daily. 30 tablet 1  . metoprolol (LOPRESSOR) 100 MG tablet Take 0.5 tablets (50 mg total) by mouth 2 (two) times daily with a meal. 60 tablet 1  . traMADol (ULTRAM) 50 MG tablet Take 1 tablet (50 mg total) by mouth every 6 (six) hours as needed (pain). 40 tablet 0   No current facility-administered medications for this visit.    Allergies: Allergies  Allergen Reactions  . Ibuprofen     REACTION: Upset GI (only motrin brand)    Social History: The patient  reports that she has never smoked. She does not have any smokeless tobacco history on file. She reports that she does not drink alcohol or use illicit drugs.   Family History: The patient's family history includes Aortic dissection (age of onset: 3) in her mother; Breast cancer in her sister; Breast cancer (age of onset: 46) in her mother; Hypertension in her daughter, father, mother, and sister; Throat cancer in her brother. There is no history of Seizures.   Review of Systems: Please see the history of present illness.   Otherwise, the review of systems is positive for fatigue.   All other systems are reviewed and negative.   Physical Exam: VS:  BP 150/80 mmHg  Pulse 80  Ht 5\' 4"  (1.626 m)  Wt 186 lb (84.369 kg)  BMI 31.91 kg/m2 .  BMI Body mass index is 31.91 kg/(m^2).   BP by me is 160/100 in both arms.  Wt Readings from Last 3 Encounters:  04/04/14 186 lb (84.369 kg)  03/28/14 187 lb 12.8 oz (85.186 kg)  03/22/14 185 lb (83.915 kg)    General:  Pleasant. Well developed, well nourished and in no acute distress. She looks better today - a little stronger. HEENT: Normal. Neck: Supple, no JVD, carotid bruits, or masses noted.  Cardiac: Regular rate and rhythm. No murmurs, rubs, or gallops. No edema. Sternum looks ok.  Respiratory:  Lungs are clear to auscultation bilaterally with normal work of breathing.  GI: Soft and nontender.  MS: No deformity or atrophy. Gait and ROM intact. Skin: Warm and dry. Color is normal.  Neuro:  Strength and sensation are intact and no gross focal deficits noted.  Psych: Alert, appropriate and with normal affect.   LABORATORY DATA:  EKG:  EKG is not ordered today.   Lab Results  Component Value Date   WBC 6.9 03/28/2014   HGB 10.1* 03/28/2014   HCT 31.4* 03/28/2014   PLT 353.0 03/28/2014   GLUCOSE 105* 03/28/2014   CHOL 87 03/06/2014   TRIG 108 03/06/2014   HDL 27* 03/06/2014   LDLCALC 38 03/06/2014   ALT 20 03/02/2014   AST 31 03/02/2014   NA 138 03/28/2014   K 3.6 03/28/2014   CL 103 03/28/2014   CREATININE 0.95 03/28/2014   BUN 11 03/28/2014   CO2 32 03/28/2014   TSH 0.64 03/11/2012   INR 0.96 03/03/2014   HGBA1C 5.8* 03/07/2014    BNP (last 3 results) No results for input(s): BNP in the last 8760 hours.  ProBNP (last 3 results) No results for input(s): PROBNP in the last 8760 hours.   Other Studies Reviewed Today:  Post op Echo Study Conclusions from 03/06/2014  - Left ventricle: The cavity size was normal. Wall thickness was normal. Systolic function was mildly to moderately reduced. The estimated ejection fraction was in the range of 40% to 45%. Moderate hypokinesis of the basalinferolateral myocardium. - Aortic valve: There was trivial regurgitation. Valve area (VTI): 2.42 cm^2. Valve area (Vmax): 2.19 cm^2. Valve area (Vmean): 2.08 cm^2. - Pulmonary arteries: Systolic pressure was moderately increased. PA peak pressure: 46 mm Hg  (S).  Assessment/Plan: 1. 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   2. HTN -lots of fatigue - I suspect this is from her beta blocker - will cut back to 50 mg BID and add HCTZ 25 mg daily. Check BMET today. See back in 2 weeks.   3. Mild LV dysfunction - on ACE and beta blocker -  She is asymptomatic  4. Post op anemia - recheck today - may need to increase her iron.   Current medicines are reviewed with the patient today.  The patient does not have concerns regarding medicines other than what has been noted above.  The following changes have been made:  See above.  Labs/ tests ordered today include:    Orders Placed This Encounter  Procedures  . Basic metabolic panel  . CBC     Disposition:   FU with me in 2 weeks.  Patient is agreeable to this plan and will call if any problems develop in the interim.   Signed: Burtis Junes, RN, ANP-C 04/04/2014 10:42 AM  [facility] [address] [place], [place]  [address] Phone: [phone] Fax: [phone]

## 2014-04-11 ENCOUNTER — Other Ambulatory Visit: Payer: Self-pay | Admitting: Cardiothoracic Surgery

## 2014-04-11 DIAGNOSIS — I7100 Dissection of unspecified site of aorta: Secondary | ICD-10-CM

## 2014-04-12 ENCOUNTER — Ambulatory Visit
Admission: RE | Admit: 2014-04-12 | Discharge: 2014-04-12 | Disposition: A | Discharge status: Home / Self Care | Payer: BLUE CROSS/BLUE SHIELD | Source: Ambulatory Visit | Attending: Cardiothoracic Surgery | Admitting: Cardiothoracic Surgery

## 2014-04-12 ENCOUNTER — Ambulatory Visit (INDEPENDENT_AMBULATORY_CARE_PROVIDER_SITE_OTHER): Payer: Self-pay | Admitting: Cardiothoracic Surgery

## 2014-04-12 ENCOUNTER — Encounter: Payer: Self-pay | Admitting: Cardiothoracic Surgery

## 2014-04-12 VITALS — BP 141/89 | HR 78 | Resp 16 | Ht 64.0 in | Wt 184.5 lb

## 2014-04-12 DIAGNOSIS — I7100 Dissection of unspecified site of aorta: Secondary | ICD-10-CM

## 2014-04-12 NOTE — Progress Notes (Signed)
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 SURGICAL PROCEDURES: 1. Replacement of ascending aorta. 2. Resuspension of aortic valve  3. Right axillary artery cannulation and hypothermic circulatory circulatory arrest. SURGEON: Lanelle Bal, MD.  History of Present Illness:     Patient returns to the office following replacement of ascending aorta and resuspension air valve for a type I aortic dissection. Initially postop she had significant problems with blood pressure control, this now seems much improved on current regimen. She has no specific neurologic complaints. She still does fatigue easily. She denies any shortness of breath or other symptoms of congestive heart failure.     Past Medical History  Diagnosis Date  . Morbid obesity   . HEMATURIA UNSPECIFIED   . GERD   . MYOSITIS   . HYPERTENSION   . HYPERLIPIDEMIA   . Vertigo      History  Smoking status  . Never Smoker   Smokeless tobacco  . 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 Ibuprofen     REACTION: Upset GI (only motrin brand)    Current Outpatient Prescriptions  Medication Sig Dispense Refill  . amLODipine (NORVASC) 10 MG tablet Take 1 tablet (10 mg total) by mouth daily. 180 tablet 3  . aspirin EC 81 MG EC tablet Take 1 tablet (81 mg total) by mouth daily.    [name] atorvastatin (LIPITOR) 40 MG tablet  Take 1 tablet (40 mg total) by mouth daily at 6 PM. 30 tablet 1  . Cholecalciferol (VITAMIN D3) 1000 UNITS CAPS Take 1,000 Units by mouth daily.      . ferrous sulfate 325 (65 FE) MG tablet Take 1 tablet (325 mg total) by mouth daily with breakfast. 30 tablet 1  . folic acid (FOLVITE) 1 MG tablet Take 1 tablet (1 mg total) by mouth daily. 30 tablet 1  . hydrochlorothiazide (HYDRODIURIL) 25 MG tablet Take 1 tablet (25 mg total) by mouth daily. 90 tablet 3  . lisinopril (PRINIVIL,ZESTRIL) 40 MG tablet Take 1 tablet (40 mg total) by mouth daily. 30 tablet 1  . metoprolol (LOPRESSOR) 100 MG tablet Take 0.5 tablets (50 mg total) by mouth 2 (two) times daily with a meal. 60 tablet 1  . traMADol (ULTRAM) 50 MG tablet Take 1 tablet (50 mg total) by mouth every 6 (six) hours as needed (pain). 40 tablet 0   No current facility-administered medications for this visit.       Physical Exam: BP 141/89 mmHg  Pulse 78  Resp 16  Ht 5\' 4"  (1.626 m)  Wt 184 lb 8 oz (83.689 kg)  BMI 31.65 kg/m2  SpO2 98%  General appearance: alert and cooperative Neurologic: intact Heart: regular rate and rhythm, S1, S2 normal, no murmur, click, rub or gallop Lungs: clear to auscultation bilaterally Abdomen: soft, non-tender; bowel sounds  normal; no masses,  no organomegaly Extremities: extremities normal, atraumatic, no cyanosis or edema and Homans sign is negative, no sign of DVT Wound: sternum stable right infraclavicular incision is also well healed No carotid bruits   Diagnostic Studies & Laboratory data:     Recent Radiology Findings:   Dg Chest 2 View  04/12/2014   CLINICAL DATA:  Status post ascending aortic replacement secondary to dissection on March 03, 2014. Some chest pain sets.  EXAM: CHEST  2 VIEW  COMPARISON:  PA and lateral chest of March 28, 2014  FINDINGS: The lungs are adequately inflated and clear. The cardiac silhouette is top-normal in size but stable. The pulmonary vascularity is normal.  The mediastinum is normal in width. There are 7 intact sternal wires. There is mild tortuosity of the descending thoracic aorta which is stable. The thoracic vertebral bodies are preserved in height.  IMPRESSION: There is no active cardiopulmonary disease. Previously demonstrated mild left basilar atelectasis has resolved.   Electronically Signed   By: David  Martinique   On: 04/12/2014 10:08      Recent Lab Findings: Lab Results  Component Value Date   WBC 7.2 04/04/2014   HGB 10.8* 04/04/2014   HCT 33.9* 04/04/2014   PLT 322.0 04/04/2014   GLUCOSE 102* 04/04/2014   CHOL 87 03/06/2014   TRIG 108 03/06/2014   HDL 27* 03/06/2014   LDLCALC 38 03/06/2014   ALT 20 03/02/2014   AST 31 03/02/2014   NA 139 04/04/2014   K 3.5 04/04/2014   CL 103 04/04/2014   CREATININE 1.00 04/04/2014   BUN 10 04/04/2014   CO2 30 04/04/2014   TSH 0.64 03/11/2012   INR 0.96 03/03/2014   HGBA1C 5.8* 03/07/2014      Assessment / Plan:     Overall the patient is making good progress postoperatively, blood pressure is not perfect but much better than p       Grace Isaac MD      [place] [place] [address] Office [phone]   Beeper [phone]  04/12/2014 11:11 AM

## 2014-04-18 ENCOUNTER — Other Ambulatory Visit: Payer: Self-pay

## 2014-04-18 ENCOUNTER — Encounter: Payer: Self-pay | Admitting: Nurse Practitioner

## 2014-04-18 ENCOUNTER — Ambulatory Visit (INDEPENDENT_AMBULATORY_CARE_PROVIDER_SITE_OTHER): Payer: BLUE CROSS/BLUE SHIELD | Admitting: Nurse Practitioner

## 2014-04-18 VITALS — BP 130/90 | HR 82 | Resp 18 | Ht 64.0 in | Wt 186.0 lb

## 2014-04-18 DIAGNOSIS — I10 Essential (primary) hypertension: Secondary | ICD-10-CM | POA: Diagnosis not present

## 2014-04-18 DIAGNOSIS — R899 Unspecified abnormal finding in specimens from other organs, systems and tissues: Secondary | ICD-10-CM

## 2014-04-18 DIAGNOSIS — Z9889 Other specified postprocedural states: Secondary | ICD-10-CM

## 2014-04-18 LAB — BASIC METABOLIC PANEL
BUN: 13 mg/dL (ref 6–23)
CO2: 31 mEq/L (ref 19–32)
Calcium: 9.6 mg/dL (ref 8.4–10.5)
Chloride: 97 mEq/L (ref 96–112)
Creatinine, Ser: 0.99 mg/dL (ref 0.40–1.20)
GFR: 73.69 mL/min (ref >60.00)
Glucose, Bld: 79 mg/dL (ref 70–99)
Potassium: 2.8 mEq/L — CL (ref 3.5–5.1)
Sodium: 138 mEq/L (ref 135–145)

## 2014-04-18 MED ORDER — POTASSIUM CHLORIDE CRYS ER 20 MEQ PO TBCR: 20.0000 meq | EXTENDED_RELEASE_TABLET | Freq: Two times a day (BID) | ORAL | Status: DC

## 2014-04-18 NOTE — Progress Notes (Signed)
CARDIOLOGY OFFICE NOTE  Date:  04/18/2014    Alexis Wheeler Date of Birth: 08-Dec-1955 Medical Record #[id number]  PCP:  [name], MD  Cardiologist:  Ellyn Hack    Chief Complaint  Patient presents with  . Hypertension    Follow up visit -seen for Dr. Ellyn Hack     History of Present Illness: Alexis Wheeler is a 59 y.o. female who presents today for a 2 week check. She is seen for Dr. Ellyn Hack. She has a history of HTN and no prior cardiac issues otherwise.   She presented to the ER at [facility] over a month ago - having chest pain, SOB and nausea. 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 A cardiac surgical consultation was requested and Dr. Servando Snare saw the patient and reviewed her films. The patient was transferred urgently to [facility] for surgical repair - replacement of ascending aorta with resuspension of aortic valve with 30 mm Hemashield Platinum graft with right axillary artery cannulation and hypothermic circulatory arrest.   An intraoperative vascular surgery consult was obtained for evaluation of the transient ischemia of her left lower extremity. Dr. Scot Dock saw the patient and reviewed her films and felt that she had reperfused her common femoral artery and would not need further surgical intervention.   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 This was monitored and treated conservatively. Creatinine peaked at 1.63 and then trended down to baseline.   She was hypertensive postoperatively, and initially required a nitrogycerin drip. This was weaned and discontinued on postop day 2 and she was started on Lopressor and a Catapres patch.  Cardiology was consulted to assist with management. Catapres was discontinued and she was restarted on Norvasc. A 2D echo was performed which revealed mild LV systolic dysfunction with EF 40-45%. Once her renal function stabilized, she was restarted on an ACE-I. She had had a mild postop anemia which has not required transfusion and for which she was started on oral iron. She had a left pleural effusion on chest x-ray which improved with diuresis. She was discharged on 03/12/2014.  I have seen her twice since her surgery - was having major BP issues - medicines were all mixed up - she was not on Norvasc - she told me this was stopped - I could not see where it was stopped -. We got that straightened out. Slow progress but overall felt to be doing ok. At last visit she was doing better - lots of fatigue and BP improving so I cut her beta blocker back.   Comes back today. Here alone today. Saw Dr. Servando Snare last week - BP was 141/89. She notes that she is doing better. Less fatigue. Does get some sharp/prickly fleeting pains over her incision. Back doing some driving. Doing more at home. BP running pretty consistently at home under 140/90. She feels ok on her medicines. Fatigue has improved with reduction in her beta blocker.    Past Medical History  Diagnosis Date  . Morbid obesity   . HEMATURIA UNSPECIFIED   . GERD   . MYOSITIS   . HYPERTENSION   . HYPERLIPIDEMIA   . Vertigo  Past Surgical History  Procedure Laterality Date  . Cholecystectomy  2001  . Abdominal hysterectomy  1983  . Replacement ascending aorta N/A 03/03/2014    Procedure: REPAIR OF ASCENDING AORTIC DISSECTION;  Surgeon: Grace Isaac, MD;  Location: [facility];  Service: Open Heart Surgery;  Laterality: N/A;     Medications: Current Outpatient Prescriptions  Medication Sig Dispense Refill  . amLODipine (NORVASC) 10 MG tablet Take 1 tablet (10 mg total) by mouth daily. 180 tablet 3  . aspirin EC 81 MG EC tablet Take 1  tablet (81 mg total) by mouth daily.    [name] atorvastatin (LIPITOR) 40 MG tablet Take 1 tablet (40 mg total) by mouth daily at 6 PM. 30 tablet 1  . Cholecalciferol (VITAMIN D3) 1000 UNITS CAPS Take 1,000 Units by mouth daily.      . ferrous sulfate 325 (65 FE) MG tablet Take 1 tablet (325 mg total) by mouth daily with breakfast. 30 tablet 1  . folic acid (FOLVITE) 1 MG tablet Take 1 tablet (1 mg total) by mouth daily. 30 tablet 1  . hydrochlorothiazide (HYDRODIURIL) 25 MG tablet Take 1 tablet (25 mg total) by mouth daily. 90 tablet 3  . HYDROcodone-acetaminophen (NORCO/VICODIN) 5-325 MG per tablet Take 1 tablet by mouth as needed. Every 6 hours as needed for pain  0  . lisinopril (PRINIVIL,ZESTRIL) 40 MG tablet Take 1 tablet (40 mg total) by mouth daily. 30 tablet 1  . metoprolol (LOPRESSOR) 100 MG tablet Take 0.5 tablets (50 mg total) by mouth 2 (two) times daily with a meal. 60 tablet 1  . traMADol (ULTRAM) 50 MG tablet Take 1 tablet (50 mg total) by mouth every 6 (six) hours as needed (pain). 40 tablet 0   No current facility-administered medications for this visit.    Allergies: Allergies  Allergen Reactions  . Ibuprofen     REACTION: Upset GI (only motrin brand)    Social History: The patient  reports that she has never smoked. She does not have any smokeless tobacco history on file. She reports that she does not drink alcohol or use illicit drugs.   Family History: The patient's family history includes Aortic dissection (age of onset: 77) in her mother; Breast cancer in her sister; Breast cancer (age of onset: 14) in her mother; Hypertension in her daughter, father, mother, and sister; Throat cancer in her brother. There is no history of Seizures.   Review of Systems: Please see the history of present illness.   Otherwise, the review of systems is positive for fatigue.   All other systems are reviewed and negative.   Physical Exam: VS:  BP 130/90 mmHg  Pulse 82  Resp 18  Ht 5\' 4"   (1.626 m)  Wt 186 lb (84.369 kg)  BMI 31.91 kg/m2  SpO2 96% .  BMI Body mass index is 31.91 kg/(m^2).  Wt Readings from Last 3 Encounters:  04/18/14 186 lb (84.369 kg)  04/12/14 184 lb 8 oz (83.689 kg)  04/04/14 186 lb (84.369 kg)    General: Pleasant. Well developed, well nourished and in no acute distress.  HEENT: Normal. Neck: Supple, no JVD, carotid bruits, or masses noted.  Cardiac: Regular rate and rhythm. No murmurs, rubs, or gallops. No edema.  Respiratory:  Lungs are clear to auscultation bilaterally with normal work of breathing.  GI: Soft and nontender.  MS: No deformity or atrophy. Gait and ROM intact. Skin: Warm and dry. Color is normal.  Neuro:  Strength and sensation  are intact and no gross focal deficits noted.  Psych: Alert, appropriate and with normal affect.   LABORATORY DATA:  EKG:  EKG is not ordered today.   Lab Results  Component Value Date   WBC 7.2 04/04/2014   HGB 10.8* 04/04/2014   HCT 33.9* 04/04/2014   PLT 322.0 04/04/2014   GLUCOSE 102* 04/04/2014   CHOL 87 03/06/2014   TRIG 108 03/06/2014   HDL 27* 03/06/2014   LDLCALC 38 03/06/2014   ALT 20 03/02/2014   AST 31 03/02/2014   NA 139 04/04/2014   K 3.5 04/04/2014   CL 103 04/04/2014   CREATININE 1.00 04/04/2014   BUN 10 04/04/2014   CO2 30 04/04/2014   TSH 0.64 03/11/2012   INR 0.96 03/03/2014   HGBA1C 5.8* 03/07/2014    BNP (last 3 results) No results for input(s): BNP in the last 8760 hours.  ProBNP (last 3 results) No results for input(s): PROBNP in the last 8760 hours.   Other Studies Reviewed Today: Post op Echo Study Conclusions from 03/06/2014  - Left ventricle: The cavity size was normal. Wall thickness was normal. Systolic function was mildly to moderately reduced. The estimated ejection fraction was in the range of 40% to 45%. Moderate hypokinesis of the basalinferolateral myocardium. - Aortic valve: There was trivial regurgitation. Valve area (VTI): 2.42  cm^2. Valve area (Vmax): 2.19 cm^2. Valve area (Vmean): 2.08 cm^2. - Pulmonary arteries: Systolic pressure was moderately increased. PA peak pressure: 46 mm Hg (S).  Assessment/Plan: 1. 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   2. HTN -Check BMET today. I have left her on her current regimen for now - she will monitor at home.   3. Mild LV dysfunction - on ACE and beta blocker - She is asymptomatic  4. Post op anemia - on iron.   She a concern about who her following MD will be here. She does not wish to go to the [place] office. She will discuss with her family (sister works in SICU) and let me know. I will see her back in 4 weeks and then we will get her established with new cardiologist.   Current medicines are reviewed with the patient today.  The patient does not have concerns regarding medicines other than what has been noted above.  The following changes have been made:  See above.  Labs/ tests ordered today include:   No orders of the defined types were placed in this encounter.     Disposition:   FU with me in 4 weeks  Patient is agreeable to this plan and will call if any problems develop in the interim.   Signed: Burtis Junes, RN, ANP-C 04/18/2014 10:47 AM  [facility] [address] [place], [place]  [address] Phone: [phone] Fax: [phone]

## 2014-04-18 NOTE — Patient Instructions (Addendum)
We will be checking the following labs today BMET  Stay on your current medicines  Monitor your BP at home for me - keep a diary   Talk to [facility] about who you would like to follow with  See me in 4 weeks  Call the [place] office at [phone] if you have any questions, problems or concerns.

## 2014-04-19 ENCOUNTER — Telehealth: Payer: Self-pay | Admitting: *Deleted

## 2014-04-19 NOTE — Telephone Encounter (Signed)
Signed cardiac rehab ll order-faxed

## 2014-04-20 ENCOUNTER — Other Ambulatory Visit: Payer: BLUE CROSS/BLUE SHIELD

## 2014-04-23 ENCOUNTER — Telehealth: Payer: Self-pay | Admitting: Nurse Practitioner

## 2014-04-23 NOTE — Telephone Encounter (Signed)
S/w pt stated the bottom of pt's left foot hurts, the pain runs up leg to the knee, stated wrapped leg, elevated and have knee wrapped. Denies any other symptoms Pt will see if pain goes away I offered to move appt up pt stated no. I also stated to go to PCP, pt stated Dr. Asa Lente took another position I stated would try and get pt another PCP, pt did not want to Pt stated would call back to move appt up if pain does not go away

## 2014-04-23 NOTE — Telephone Encounter (Signed)
New message        C/o leg and foot pain for 3 days   pt has appt on 5/12 but is requesting to come within the next week

## 2014-04-25 ENCOUNTER — Other Ambulatory Visit (INDEPENDENT_AMBULATORY_CARE_PROVIDER_SITE_OTHER): Payer: BLUE CROSS/BLUE SHIELD | Admitting: *Deleted

## 2014-04-25 DIAGNOSIS — R899 Unspecified abnormal finding in specimens from other organs, systems and tissues: Secondary | ICD-10-CM

## 2014-04-25 LAB — BASIC METABOLIC PANEL
BUN: 17 mg/dL (ref 6–23)
CO2: 30 mEq/L (ref 19–32)
Calcium: 9.2 mg/dL (ref 8.4–10.5)
Chloride: 102 mEq/L (ref 96–112)
Creatinine, Ser: 1.03 mg/dL (ref 0.40–1.20)
GFR: 70.39 mL/min (ref >60.00)
Glucose, Bld: 117 mg/dL — ABNORMAL HIGH (ref 70–99)
Potassium: 3 mEq/L — ABNORMAL LOW (ref 3.5–5.1)
Sodium: 140 mEq/L (ref 135–145)

## 2014-04-27 ENCOUNTER — Other Ambulatory Visit: Payer: Self-pay

## 2014-04-27 DIAGNOSIS — E876 Hypokalemia: Secondary | ICD-10-CM

## 2014-04-27 MED ORDER — POTASSIUM CHLORIDE CRYS ER 20 MEQ PO TBCR: 40.0000 meq | EXTENDED_RELEASE_TABLET | Freq: Two times a day (BID) | ORAL | Status: DC

## 2014-05-02 ENCOUNTER — Ambulatory Visit (INDEPENDENT_AMBULATORY_CARE_PROVIDER_SITE_OTHER): Payer: BLUE CROSS/BLUE SHIELD | Admitting: Family Medicine

## 2014-05-02 ENCOUNTER — Ambulatory Visit: Payer: BLUE CROSS/BLUE SHIELD | Admitting: Nurse Practitioner

## 2014-05-02 ENCOUNTER — Encounter: Payer: Self-pay | Admitting: Family Medicine

## 2014-05-02 ENCOUNTER — Other Ambulatory Visit (INDEPENDENT_AMBULATORY_CARE_PROVIDER_SITE_OTHER): Payer: BLUE CROSS/BLUE SHIELD

## 2014-05-02 VITALS — BP 130/86 | HR 86 | Ht 64.0 in | Wt 185.0 lb

## 2014-05-02 DIAGNOSIS — M722 Plantar fascial fibromatosis: Secondary | ICD-10-CM | POA: Diagnosis not present

## 2014-05-02 DIAGNOSIS — M79672 Pain in left foot: Secondary | ICD-10-CM | POA: Diagnosis not present

## 2014-05-02 NOTE — Patient Instructions (Signed)
Good to see you and you look great! Ice heel 10-20 minutes at night Try the brace Exercises most days of the week Good shoes with rigid bottom.  Alexis Wheeler, Merrell or New balance greater then 700 Avoid being barefoot.  Try pennsaid twice daily See me again in 3 weeks.

## 2014-05-02 NOTE — Assessment & Plan Note (Signed)
Discussed home exercises, icing protocol, as well as proper shoe wear. Patient learned home exercises in greater detail. Patient was also given a brace to help support the longitudinal arch. Patient has been a try these different interventions as well as topical anti-inflammatories and come back and see me again in 3 weeks. If continuing have pain we may need to consider injection.

## 2014-05-02 NOTE — Progress Notes (Signed)
Pre visit review using our clinic review tool, if applicable. No additional management support is needed unless otherwise documented below in the visit note. 

## 2014-05-02 NOTE — Progress Notes (Signed)
  [facility] [place] [place], [place] [address] Phone: [phone] Subjective:     CC: Foot pain  YPP:[id number] LAURIN PAULO is a 59 y.o. female coming in with complaint of left foot pain. Patient is virtually has been sick since her last visit. 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 Patient is now starting to increase her activity and is having more of a left foot pain. Patient states that hurts more with the first at the morning. Patient does not remember any true injury. Patient states if she sits for long amount of time he can be very painful as well. States that is mostly on the bottom of her foot. Patient states he can radiate towards her toes. Denies any numbness or weakness. Rates the severity of pain when it occurs as 9 out of 10. Has not tried any significant home modalities at this time.     Past medical history, social, surgical and family history all reviewed in electronic medical record.   Review of Systems: No headache, visual changes, nausea, vomiting, diarrhea, constipation, dizziness, abdominal pain, skin rash, fevers, chills, night sweats, weight loss, swollen lymph nodes, body aches, joint swelling, muscle aches, chest pain, shortness of breath, mood changes.   Objective Blood pressure 130/86, pulse 86, height 5\' 4"  (1.626 m), weight 185 lb (83.915 kg), SpO2 99 %.  General: No apparent distress alert and oriented x3 mood and affect normal, dressed appropriately.  HEENT: Pupils equal, extraocular movements intact  Respiratory: Patient's speak in full sentences and does not appear short of breath  Cardiovascular: No lower extremity edema, non tender, no erythema  Skin: Warm dry intact with no signs of infection or rash on extremities or on axial skeleton.  Abdomen: Soft nontender  Neuro: Cranial nerves II through XII are intact, neurovascularly intact in all extremities with 2+ DTRs and 2+ pulses.  Lymph: No  lymphadenopathy of posterior or anterior cervical chain or axillae bilaterally.  Gait antalgic gait MSK:  Non tender with full range of motion and good stability and symmetric strength and tone of shoulders, elbows, wrist, hip, knee and ankles bilaterally. Mild osteophytic changes Foot exam shows the patient does have significant breakdown of the longitudinal arch of the feet bilaterally left greater than right. Patient is tender to palpation over the medial plantar aspect of the calcaneal region on the left foot. Patient does have some mild tightness of the posterior capsule of the heel.  Limited muscular skeletal ultrasound was done and interpreted by Hulan Saas, M  Limited ultrasound the patient's left heel does show that patient does have significant inflammation of the plantar fascia. Measures a partially 1.6 cm Impression: Plantar fasciitis     Procedure note 97110; 15 minutes spent for Therapeutic exercises as stated in above notes.  This included exercises focusing on stretching, strengthening, with significant focus on eccentric aspects.  Discussed stretching the posterior capsule including flexion-extension as well as heel raises on a step. Discussed strengthening transverse as well as longitudinal arch type exercises and proper shoewear. Proper technique shown and discussed handout in great detail with ATC.  All questions were discussed and answered.   Impression and Recommendations:     This case required medical decision making of moderate complexity.

## 2014-05-03 ENCOUNTER — Other Ambulatory Visit: Payer: BLUE CROSS/BLUE SHIELD

## 2014-05-03 ENCOUNTER — Encounter (HOSPITAL_COMMUNITY)
Admission: RE | Admit: 2014-05-03 | Discharge: 2014-05-03 | Disposition: A | Discharge status: Home / Self Care | Payer: BLUE CROSS/BLUE SHIELD | Source: Ambulatory Visit | Attending: Cardiology | Admitting: Cardiology

## 2014-05-03 DIAGNOSIS — Z0279 Encounter for issue of other medical certificate: Secondary | ICD-10-CM

## 2014-05-03 DIAGNOSIS — Z95828 Presence of other vascular implants and grafts: Secondary | ICD-10-CM | POA: Insufficient documentation

## 2014-05-03 DIAGNOSIS — Z48812 Encounter for surgical aftercare following surgery on the circulatory system: Secondary | ICD-10-CM | POA: Insufficient documentation

## 2014-05-03 NOTE — Progress Notes (Signed)
Cardiac Rehab Medication Review by a Pharmacist  Does the patient  feel that his/her medications are working for him/her?  yes  Has the patient been experiencing any side effects to the medications prescribed?  No; Patient complains of headaches occasionally.  Does the patient measure his/her own blood pressure or blood glucose at home?  She measures her blood pressure - she reports that her BP runs between ~ 130/86 - 150/85.  Does the patient have any problems obtaining medications due to transportation or finances?   yes  Understanding of regimen: good Understanding of indications: good Potential of compliance: good    Pharmacist comments: Patient appears to have a good understanding of her medications.  She monitors her blood pressure and reports that it is typically within goal.  Her daughter helps her with her medications.    Theron Arista, PharmD Clinical Pharmacist - Resident Pager: [phone] 4/21/20168:32 AM

## 2014-05-06 ENCOUNTER — Other Ambulatory Visit: Payer: Self-pay | Admitting: Physician Assistant

## 2014-05-07 ENCOUNTER — Encounter (HOSPITAL_COMMUNITY)
Admission: RE | Admit: 2014-05-07 | Discharge: 2014-05-07 | Disposition: A | Discharge status: Home / Self Care | Payer: BLUE CROSS/BLUE SHIELD | Source: Ambulatory Visit | Attending: Cardiology | Admitting: Cardiology

## 2014-05-07 DIAGNOSIS — Z48812 Encounter for surgical aftercare following surgery on the circulatory system: Secondary | ICD-10-CM | POA: Diagnosis not present

## 2014-05-07 DIAGNOSIS — Z95828 Presence of other vascular implants and grafts: Secondary | ICD-10-CM | POA: Diagnosis not present

## 2014-05-08 ENCOUNTER — Encounter (HOSPITAL_COMMUNITY): Payer: Self-pay

## 2014-05-08 NOTE — Progress Notes (Signed)
Pt started cardiac rehab today.  Pt tolerated light exercise without difficulty. VSS, telemetry-normal sinus rhythm, asymptomatic.  Medication list reconciled.  Pt verbalized compliance with medications and denies barriers to compliance. PHQ-1. Pt admits to periods of sadness associated with crying as she dwells on her illness and associated physical limitations postoperatively.  Pt is discouraged that she is unable to maintain her housework independently.  Pt has family that assists her in transporting her clothes to the laundry mat.  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  Pt has been given a Dispensing optician.  Pt did not return her homework packet.   Pt exhibits hopeful outlook with supportive family.  Psychosocial needs identified include improved coping skills.  Interventions include pt participation in stress management class, mindfulness and meditation education class and vocational rehab referral.       Pt cardiac rehab short term goal is  to increase ability to perform her housework.      Pt long term cardiac rehab goal is to increase strength and stamina.  Pt encouraged to participate in cardiac rehab activities to increase her strength and stamina.  Pt oriented to exercise equipment and routine.  Understanding verbalized.

## 2014-05-09 ENCOUNTER — Other Ambulatory Visit: Payer: Self-pay | Admitting: Physician Assistant

## 2014-05-09 ENCOUNTER — Encounter (HOSPITAL_COMMUNITY)
Admission: RE | Admit: 2014-05-09 | Discharge: 2014-05-09 | Disposition: A | Discharge status: Home / Self Care | Payer: BLUE CROSS/BLUE SHIELD | Source: Ambulatory Visit | Attending: Cardiology | Admitting: Cardiology

## 2014-05-09 DIAGNOSIS — Z48812 Encounter for surgical aftercare following surgery on the circulatory system: Secondary | ICD-10-CM | POA: Diagnosis not present

## 2014-05-10 ENCOUNTER — Other Ambulatory Visit: Payer: Self-pay

## 2014-05-10 ENCOUNTER — Telehealth: Payer: Self-pay

## 2014-05-10 MED ORDER — LISINOPRIL 40 MG PO TABS: 40.0000 mg | ORAL_TABLET | Freq: Every day | ORAL | Status: DC

## 2014-05-10 MED ORDER — METOPROLOL TARTRATE 100 MG PO TABS: 50.0000 mg | ORAL_TABLET | Freq: Two times a day (BID) | ORAL | Status: DC

## 2014-05-10 MED ORDER — FOLIC ACID 1 MG PO TABS: 1.0000 mg | ORAL_TABLET | Freq: Every day | ORAL | Status: DC

## 2014-05-10 MED ORDER — ATORVASTATIN CALCIUM 40 MG PO TABS: 40.0000 mg | ORAL_TABLET | Freq: Every day | ORAL | Status: DC

## 2014-05-10 NOTE — Telephone Encounter (Signed)
Left message on vm for pt that Truitt Merle, NP, does not feel tramadol.  If have any questions to call our office. Stated has to call pcp. [employer].

## 2014-05-11 ENCOUNTER — Encounter (HOSPITAL_COMMUNITY)
Admission: RE | Admit: 2014-05-11 | Discharge: 2014-05-11 | Disposition: A | Discharge status: Home / Self Care | Payer: BLUE CROSS/BLUE SHIELD | Source: Ambulatory Visit | Attending: Cardiology | Admitting: Cardiology

## 2014-05-11 DIAGNOSIS — Z48812 Encounter for surgical aftercare following surgery on the circulatory system: Secondary | ICD-10-CM | POA: Diagnosis not present

## 2014-05-11 NOTE — Telephone Encounter (Signed)
Let me send this to Dr. Servando Snare - I think he will refill for her.

## 2014-05-14 ENCOUNTER — Ambulatory Visit: Payer: BLUE CROSS/BLUE SHIELD | Admitting: Internal Medicine

## 2014-05-14 ENCOUNTER — Encounter (HOSPITAL_COMMUNITY)
Admission: RE | Admit: 2014-05-14 | Discharge: 2014-05-14 | Disposition: A | Discharge status: Home / Self Care | Payer: BLUE CROSS/BLUE SHIELD | Source: Ambulatory Visit | Attending: Cardiology | Admitting: Cardiology

## 2014-05-14 DIAGNOSIS — Z95828 Presence of other vascular implants and grafts: Secondary | ICD-10-CM | POA: Insufficient documentation

## 2014-05-14 DIAGNOSIS — Z0289 Encounter for other administrative examinations: Secondary | ICD-10-CM

## 2014-05-14 DIAGNOSIS — Z48812 Encounter for surgical aftercare following surgery on the circulatory system: Secondary | ICD-10-CM | POA: Diagnosis not present

## 2014-05-15 ENCOUNTER — Other Ambulatory Visit: Payer: Self-pay | Admitting: *Deleted

## 2014-05-15 DIAGNOSIS — M5414 Radiculopathy, thoracic region: Secondary | ICD-10-CM

## 2014-05-15 MED ORDER — TRAMADOL HCL 50 MG PO TABS: 50.0000 mg | ORAL_TABLET | Freq: Four times a day (QID) | ORAL | Status: DC | PRN

## 2014-05-15 NOTE — Telephone Encounter (Signed)
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  She had seen Alexis Wheeler, N.P. recently and had asked for a refill of her pain medication.  Alexis Wheeler sent a message to [place] requesting [place] to address this issue. I called Alexis Wheeler to discuss it.  It has been over two months since her surgery, but she is experiencing burning of her sternal incision with movement.  I said I would fax her a new RX for Tramadol to her pharmacy today and she agreed.

## 2014-05-16 ENCOUNTER — Encounter (HOSPITAL_COMMUNITY): Payer: BLUE CROSS/BLUE SHIELD

## 2014-05-18 ENCOUNTER — Encounter (HOSPITAL_COMMUNITY): Payer: BLUE CROSS/BLUE SHIELD

## 2014-05-18 ENCOUNTER — Encounter (HOSPITAL_COMMUNITY)
Admission: RE | Admit: 2014-05-18 | Discharge: 2014-05-18 | Disposition: A | Discharge status: Home / Self Care | Payer: BLUE CROSS/BLUE SHIELD | Source: Ambulatory Visit | Attending: Cardiology | Admitting: Cardiology

## 2014-05-18 DIAGNOSIS — Z48812 Encounter for surgical aftercare following surgery on the circulatory system: Secondary | ICD-10-CM | POA: Diagnosis not present

## 2014-05-21 ENCOUNTER — Encounter (HOSPITAL_COMMUNITY): Payer: BLUE CROSS/BLUE SHIELD

## 2014-05-21 ENCOUNTER — Encounter (HOSPITAL_COMMUNITY)
Admission: RE | Admit: 2014-05-21 | Discharge: 2014-05-21 | Disposition: A | Discharge status: Home / Self Care | Payer: BLUE CROSS/BLUE SHIELD | Source: Ambulatory Visit | Attending: Cardiology | Admitting: Cardiology

## 2014-05-21 DIAGNOSIS — Z48812 Encounter for surgical aftercare following surgery on the circulatory system: Secondary | ICD-10-CM | POA: Diagnosis not present

## 2014-05-21 NOTE — Progress Notes (Signed)
Reviewed home exercise guidelines with patient including endpoints, temperature precautions, target heart rate and rate of perceived exertion. Pt plans to walk and use a stepper at home as her mode of home exercise. Pt voices understanding of instructions given.  [place] Academic Intern Sol Passer, MS, ACSM CCEP

## 2014-05-23 ENCOUNTER — Ambulatory Visit: Payer: BLUE CROSS/BLUE SHIELD | Admitting: Family Medicine

## 2014-05-23 ENCOUNTER — Encounter (HOSPITAL_COMMUNITY)
Admission: RE | Admit: 2014-05-23 | Discharge: 2014-05-23 | Disposition: A | Discharge status: Home / Self Care | Payer: BLUE CROSS/BLUE SHIELD | Source: Ambulatory Visit | Attending: Cardiology | Admitting: Cardiology

## 2014-05-23 DIAGNOSIS — Z48812 Encounter for surgical aftercare following surgery on the circulatory system: Secondary | ICD-10-CM | POA: Diagnosis not present

## 2014-05-24 ENCOUNTER — Telehealth: Payer: Self-pay | Admitting: *Deleted

## 2014-05-24 ENCOUNTER — Ambulatory Visit (INDEPENDENT_AMBULATORY_CARE_PROVIDER_SITE_OTHER): Payer: BLUE CROSS/BLUE SHIELD | Admitting: Nurse Practitioner

## 2014-05-24 ENCOUNTER — Encounter: Payer: Self-pay | Admitting: Nurse Practitioner

## 2014-05-24 VITALS — BP 146/102 | HR 66 | Ht 63.0 in | Wt 181.0 lb

## 2014-05-24 DIAGNOSIS — Z9889 Other specified postprocedural states: Secondary | ICD-10-CM

## 2014-05-24 DIAGNOSIS — I10 Essential (primary) hypertension: Secondary | ICD-10-CM | POA: Diagnosis not present

## 2014-05-24 DIAGNOSIS — E785 Hyperlipidemia, unspecified: Secondary | ICD-10-CM

## 2014-05-24 LAB — BASIC METABOLIC PANEL
BUN: 12 mg/dL (ref 6–23)
CO2: 33 mEq/L — ABNORMAL HIGH (ref 19–32)
Calcium: 9.6 mg/dL (ref 8.4–10.5)
Chloride: 101 mEq/L (ref 96–112)
Creatinine, Ser: 0.88 mg/dL (ref 0.40–1.20)
GFR: 84.39 mL/min (ref >60.00)
Glucose, Bld: 95 mg/dL (ref 70–99)
Potassium: 3.2 mEq/L — ABNORMAL LOW (ref 3.5–5.1)
Sodium: 139 mEq/L (ref 135–145)

## 2014-05-24 LAB — CBC
HCT: 37.3 % (ref 36.0–46.0)
Hemoglobin: 12 g/dL (ref 12.0–15.0)
MCHC: 32.3 g/dL (ref 30.0–36.0)
MCV: 67.5 fl — ABNORMAL LOW (ref 78.0–100.0)
Platelets: 273 10*3/uL (ref 150.0–400.0)
RBC: 5.53 Mil/uL — ABNORMAL HIGH (ref 3.87–5.11)
RDW: 18.5 % — ABNORMAL HIGH (ref 11.5–15.5)
WBC: 5.7 10*3/uL (ref 4.0–10.5)

## 2014-05-24 MED ORDER — HYDRALAZINE HCL 50 MG PO TABS: 50.0000 mg | ORAL_TABLET | Freq: Two times a day (BID) | ORAL | Status: DC

## 2014-05-24 MED ORDER — METOPROLOL TARTRATE 100 MG PO TABS: 50.0000 mg | ORAL_TABLET | Freq: Two times a day (BID) | ORAL | Status: DC

## 2014-05-24 NOTE — Telephone Encounter (Signed)
Preliminary report reviewed by triage nurse and sent to MD desk. Called patient to inform her of K+ = 3.2 (still low but improving). Patient advised to take extra dose KCl tonight and take extra dose KCl tomorrow too, then resume normal frequency. Patient also scheduled to come in next Thursday for repeat BMET. Patient verbalized understanding and agreement with tx plan. Message routed to Truitt Merle, NP.

## 2014-05-24 NOTE — Patient Instructions (Signed)
We will be checking the following labs today - BMET & CBC    Medication Instructions:    Continue with your current medicines but  I am weaning you off the metoprolol - cut to just a half a pill each day for one week and then stop  I am starting Hydralazine 50 mg to take twice a day    Testing/Procedures To Be Arranged:  N/A  Follow-Up:   I will see you back in a month  We will get you a new patient visit with Dr. Radford Pax for 2 months    Other Special Instructions:   Keep going to cardiac rehab - it will help  Use Vitamin E oil and cocoa butter on your incision  Call the [place] office at [phone] if you have any questions, problems or concerns.

## 2014-05-24 NOTE — Progress Notes (Signed)
CARDIOLOGY OFFICE NOTE  Date:  05/24/2014    Alexis Wheeler Date of Birth: [birth year] Medical Record #[id number]  PCP:  Unice Cobble, MD  Cardiologist:  To establish with     Chief Complaint  Patient presents with  . Hypertension    Follow up visit     History of Present Illness: Alexis Wheeler is a 59 y.o. female who presents today for a follow up visit. She will be establishing with Dr. Radford Pax. She has a history of HTN and no prior cardiac issues otherwise.   She presented to the ER at [facility] back in February - having chest pain, SOB and nausea. 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 A cardiac surgical consultation was requested and Dr. Servando Snare saw the patient and reviewed her films. The patient was transferred urgently to [facility] for surgical repair - replacement of ascending aorta with resuspension of aortic valve with 30 mm Hemashield Platinum graft with right axillary artery cannulation and hypothermic circulatory arrest.   An intraoperative vascular surgery consult was obtained for evaluation of the transient ischemia of her left lower extremity. Dr. Scot Dock saw the patient and reviewed her films and felt that she had reperfused her common femoral artery and would not need further surgical intervention.   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 This was monitored and treated conservatively. Creatinine peaked at 1.63 and then trended down to baseline.   She was hypertensive postoperatively, and initially required a nitrogycerin drip. This was weaned and discontinued on postop day 2 and she was started on Lopressor and a Catapres patch.  Cardiology was consulted to assist with management. Catapres was discontinued and she was restarted on Norvasc. A 2D echo was performed which revealed mild LV systolic dysfunction with EF 40-45%. Once her renal function stabilized, she was restarted on an ACE-I. She had had a mild postop anemia which has not required transfusion and for which she was started on oral iron. She had a left pleural effusion on chest x-ray which improved with diuresis. She was discharged on 03/12/2014.  I have seen her several times since her surgery - was having major BP issues - medicines were all mixed up - she was not on Norvasc - she told me this was stopped - I could not see where it was stopped -. We got that straightened out. Slow progress but overall felt to be doing ok. She has improved with time.  I last saw her a month ago - continuing to make progress - slow but progressing.   Comes back today. Here alone today. She says she is "ok". She is in cardiac rehab - does not like it but is going. Still with lots of fatigue. Biggest issue is stinging in her incision. Can't sleep well. BP ok so far at rehab.  Not doing much on the days that she does not go to rehab.     Past Medical History  Diagnosis Date  . Morbid obesity   . HEMATURIA UNSPECIFIED   . GERD   . MYOSITIS   . HYPERTENSION   . HYPERLIPIDEMIA   . Vertigo     Past Surgical  History  Procedure Laterality Date  . Cholecystectomy  2001  . Abdominal hysterectomy  1983  . Replacement ascending aorta N/A 03/03/2014    Procedure: REPAIR OF ASCENDING AORTIC DISSECTION;  Surgeon: Grace Isaac, MD;  Location: [place];  Service: Open Heart Surgery;  Laterality: N/A;     Medications: Current Outpatient Prescriptions  Medication Sig Dispense Refill  . amLODipine (NORVASC) 10 MG tablet Take 1 tablet (10 mg total) by mouth daily. 180 tablet 3  . aspirin EC 81 MG EC tablet Take 1 tablet (81 mg total) by mouth daily.    [name] atorvastatin (LIPITOR) 40 MG  tablet Take 1 tablet (40 mg total) by mouth daily at 6 PM. 30 tablet 6  . Cholecalciferol (VITAMIN D3) 1000 UNITS CAPS Take 1,000 Units by mouth daily.      . ferrous sulfate 325 (65 FE) MG tablet Take 1 tablet (325 mg total) by mouth daily with breakfast. 30 tablet 1  . folic acid (FOLVITE) 1 MG tablet Take 1 tablet (1 mg total) by mouth daily. 30 tablet 6  . hydrochlorothiazide (HYDRODIURIL) 25 MG tablet Take 1 tablet (25 mg total) by mouth daily. 90 tablet 3  . HYDROcodone-acetaminophen (NORCO/VICODIN) 5-325 MG per tablet Take 1 tablet by mouth as needed. Every 6 hours as needed for pain  0  . lisinopril (PRINIVIL,ZESTRIL) 40 MG tablet Take 1 tablet (40 mg total) by mouth daily. 30 tablet 6  . metoprolol (LOPRESSOR) 100 MG tablet Take 0.5 tablets (50 mg total) by mouth 2 (two) times daily with a meal. Cut to 1/2 pill for one week and then stop. 60 tablet 6  . potassium chloride SA (K-DUR,KLOR-CON) 20 MEQ tablet Take 2 tablets (40 mEq total) by mouth 2 (two) times daily. Please take 3 tablets tonight 04/18/2014, and then twice daily. 90 tablet 3  . traMADol (ULTRAM) 50 MG tablet Take 1 tablet (50 mg total) by mouth every 6 (six) hours as needed (pain). 40 tablet 0  . hydrALAZINE (APRESOLINE) 50 MG tablet Take 1 tablet (50 mg total) by mouth 2 (two) times daily. 60 tablet 3   No current facility-administered medications for this visit.    Allergies: Allergies  Allergen Reactions  . Ibuprofen     REACTION: Upset GI (only motrin brand)    Social History: The patient  reports that she has never smoked. She does not have any smokeless tobacco history on file. She reports that she does not drink alcohol or use illicit drugs.   Family History: The patient's family history includes Aortic dissection (age of onset: 48) in her mother; Breast cancer in her sister; Breast cancer (age of onset: 68) in her mother; Hypertension in her daughter, father, mother, and sister; Throat cancer in her brother.  There is no history of Seizures.   Review of Systems: Please see the history of present illness.   Otherwise, the review of systems is positive for stinging of her sternal inicision.   All other systems are reviewed and negative.   Physical Exam: VS:  BP 146/102 mmHg  Pulse 66  Ht 5\' 3"  (1.6 m)  Wt 181 lb (82.101 kg)  BMI 32.07 kg/m2  SpO2 98% .  BMI Body mass index is 32.07 kg/(m^2).  Wt Readings from Last 3 Encounters:  05/24/14 181 lb (82.101 kg)  05/02/14 185 lb (83.915 kg)  04/18/14 186 lb (84.369 kg)   BP recheck by me is 160/90 - has just taken her medicines this am.  General:  Pleasant. Well developed, well nourished and in no acute distress.  HEENT: Normal. Neck: Supple, no JVD, carotid bruits, or masses noted.  Cardiac: Regular rate and rhythm. No murmurs, rubs, or gallops. No edema. Sternum with keloid scarring.  Respiratory:  Lungs are clear to auscultation bilaterally with normal work of breathing.  GI: Soft and nontender.  MS: No deformity or atrophy. Gait and ROM intact. Skin: Warm and dry. Color is normal.  Neuro:  Strength and sensation are intact and no gross focal deficits noted.  Psych: Alert, appropriate and with normal affect.   LABORATORY DATA:  EKG:  EKG is not ordered today.  Lab Results  Component Value Date   WBC 7.2 04/04/2014   HGB 10.8* 04/04/2014   HCT 33.9* 04/04/2014   PLT 322.0 04/04/2014   GLUCOSE 117* 04/25/2014   CHOL 87 03/06/2014   TRIG 108 03/06/2014   HDL 27* 03/06/2014   LDLCALC 38 03/06/2014   ALT 20 03/02/2014   AST 31 03/02/2014   NA 140 04/25/2014   K 3.0* 04/25/2014   CL 102 04/25/2014   CREATININE 1.03 04/25/2014   BUN 17 04/25/2014   CO2 30 04/25/2014   TSH 0.64 03/11/2012   INR 0.96 03/03/2014   HGBA1C 5.8* 03/07/2014    BNP (last 3 results) No results for input(s): BNP in the last 8760 hours.  ProBNP (last 3 results) No results for input(s): PROBNP in the last 8760 hours.   Other Studies Reviewed  Today: Post op Echo Study Conclusions from 03/06/2014  - Left ventricle: The cavity size was normal. Wall thickness was normal. Systolic function was mildly to moderately reduced. The estimated ejection fraction was in the range of 40% to 45%. Moderate hypokinesis of the basalinferolateral myocardium. - Aortic valve: There was trivial regurgitation. Valve area (VTI): 2.42 cm^2. Valve area (Vmax): 2.19 cm^2. Valve area (Vmean): 2.08 cm^2. - Pulmonary arteries: Systolic pressure was moderately increased. PA peak pressure: 46 mm Hg (S).  Assessment/Plan: 1. 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 Talked with Dr. Servando Snare - needs a follow up visit with him. He has also suggested vit E oil and cocoa butter for her incision for the next few months. Would like to wait 6 to 8 months post surgery before intervening with excision. She is not going to be able to work. 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 With her lifting restrictions and the requirements of her job, she will not be able to return. She is filing for disability.   2. HTN - weaning her off her beta blocker - add hydralazine in its place. She will be monitored more closely since she is in cardiac rehab.  3. Mild LV dysfunction - on ACE  - She is asymptomatic. Consider repeat in the future.   4. Post op anemia - on iron.   She has had a concern about who her following MD will be here. She does not wish to go to the [place] office. She has talked with her sister who works in SICU - they would like to establish with Dr. Radford Pax and I have talked with Dr. Radford Pax last month and she was agreeable.  Current medicines are reviewed with the patient today.  The patient does not have concerns regarding medicines other than what has been noted above.  The following changes have been made:  See above.  Labs/ tests ordered today include:  Orders  Placed This Encounter  Procedures  . Basic metabolic panel  . CBC     Disposition:   FU with me in 4 weeks.   Patient is agreeable to this plan and will call if any problems develop in the interim.   Signed: Burtis Junes, RN, ANP-C 05/24/2014 11:07 AM  [facility] [address] [place], [place]  [address] Phone: [phone] Fax: [phone]

## 2014-05-25 ENCOUNTER — Encounter (HOSPITAL_COMMUNITY): Payer: BLUE CROSS/BLUE SHIELD

## 2014-05-25 ENCOUNTER — Telehealth (HOSPITAL_COMMUNITY): Payer: Self-pay | Admitting: Cardiac Rehabilitation

## 2014-05-25 NOTE — Telephone Encounter (Signed)
pc received from pt will be absent from cardiac rehab today due staying with her daughter in the emergency room.

## 2014-05-28 ENCOUNTER — Encounter (HOSPITAL_COMMUNITY)
Admission: RE | Admit: 2014-05-28 | Discharge: 2014-05-28 | Disposition: A | Discharge status: Home / Self Care | Payer: BLUE CROSS/BLUE SHIELD | Source: Ambulatory Visit | Attending: Cardiology | Admitting: Cardiology

## 2014-05-28 DIAGNOSIS — Z48812 Encounter for surgical aftercare following surgery on the circulatory system: Secondary | ICD-10-CM | POA: Diagnosis not present

## 2014-05-28 NOTE — Progress Notes (Signed)
QUALITY OF LIFE SCORE REVIEW Patient scores low overall, primarily health and functioning,and  socioeconomic.  Scores less than 21 are considered low.  Patient quality of life  altered by physical constraints which limits her ability to perform as prior to recent cardiac surgery.  Pt c/o fatigue which limits her ability to clean house. Pt has learned to plan rest breaks with her activity which has helped. Pt continues to have incisional pain.   Pt is unable to return to work and has applied for disability. This has created a financial burden and uncertainty about the future.  Pt has orientation appt with vocational rehab scheduled 06/04/14. Pt expresses her greatest desire to be able to "get out and do more".  Pt has resumed driving however decreased energy is her biggest barrier.  Pt financial burden is also a barrier. 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  Pt has family support and faith base.  Offered emotional support and reassurance.  Will continue to monitor and intervene as necessary.

## 2014-05-28 NOTE — Progress Notes (Signed)
Alexis Wheeler 59 y.o. female Nutrition Note Spoke with pt. Nutrition Survey completed and reviewed with pt. Pt is following Step 1 of the Therapeutic Lifestyle Changes diet. Per discussion, pt typically consumes a significant amount of calories from regular soda. Pt states, "I haven't had a taste for soda since my surgery." Pt reports she has lost 11 lb from her pre-op UBW of 196 lbs. Pt wants to maintain the wt she lost "or maybe lose a little more." Pt expressed understanding of the information reviewed. Pt aware of nutrition education classes offered and declined to attend or receive handouts for nutrition classes .  Nutrition Diagnosis ? Food-and nutrition-related knowledge deficit related to lack of exposure to information as related to diagnosis of: ? CVD ? Pre-DM (A1c 5.8) ? ? Obesity related to excessive energy intake as evidenced by a BMI of 32.8  Nutrition Intervention ? Benefits of adopting Therapeutic Lifestyle Changes discussed when Medficts reviewed. ? Pt to attend the Portion Distortion class ? Continue client-centered nutrition education by RD, as part of interdisciplinary care.  Goal(s) ? Pt to watch out for sweets and added sugar. ? Pt to identify and limit food sources of saturated fat, trans fat, and cholesterol ? Pt to identify food quantities necessary to achieve: ? wt loss to a goal wt of 161-179 lb (73.2-81.4 kg) at graduation from cardiac rehab.   Monitor and Evaluate progress toward nutrition goal with team.   Derek Mound, M.Ed, RD, LDN, CDE 05/28/2014 2:30 PM

## 2014-05-30 ENCOUNTER — Other Ambulatory Visit (INDEPENDENT_AMBULATORY_CARE_PROVIDER_SITE_OTHER): Payer: BLUE CROSS/BLUE SHIELD | Admitting: *Deleted

## 2014-05-30 ENCOUNTER — Encounter (HOSPITAL_COMMUNITY)
Admission: RE | Admit: 2014-05-30 | Discharge: 2014-05-30 | Disposition: A | Discharge status: Home / Self Care | Payer: BLUE CROSS/BLUE SHIELD | Source: Ambulatory Visit | Attending: Cardiology | Admitting: Cardiology

## 2014-05-30 ENCOUNTER — Telehealth: Payer: Self-pay | Admitting: Nurse Practitioner

## 2014-05-30 DIAGNOSIS — E785 Hyperlipidemia, unspecified: Secondary | ICD-10-CM

## 2014-05-30 DIAGNOSIS — Z9889 Other specified postprocedural states: Secondary | ICD-10-CM

## 2014-05-30 DIAGNOSIS — Z48812 Encounter for surgical aftercare following surgery on the circulatory system: Secondary | ICD-10-CM | POA: Diagnosis not present

## 2014-05-30 DIAGNOSIS — I10 Essential (primary) hypertension: Secondary | ICD-10-CM | POA: Diagnosis not present

## 2014-05-30 LAB — BASIC METABOLIC PANEL
BUN: 17 mg/dL (ref 6–23)
CO2: 29 mEq/L (ref 19–32)
Calcium: 9.4 mg/dL (ref 8.4–10.5)
Chloride: 101 mEq/L (ref 96–112)
Creatinine, Ser: 1.09 mg/dL (ref 0.40–1.20)
GFR: 65.92 mL/min (ref >60.00)
Glucose, Bld: 147 mg/dL — ABNORMAL HIGH (ref 70–99)
Potassium: 3.1 mEq/L — ABNORMAL LOW (ref 3.5–5.1)
Sodium: 138 mEq/L (ref 135–145)

## 2014-05-30 NOTE — Telephone Encounter (Signed)
Walk in pt form- pt dropped off [employer] paper nothing needs to be completed she just needs this faxed in.

## 2014-06-01 ENCOUNTER — Ambulatory Visit: Payer: BLUE CROSS/BLUE SHIELD | Admitting: Family Medicine

## 2014-06-01 ENCOUNTER — Encounter (HOSPITAL_COMMUNITY): Payer: BLUE CROSS/BLUE SHIELD

## 2014-06-01 DIAGNOSIS — Z0289 Encounter for other administrative examinations: Secondary | ICD-10-CM

## 2014-06-04 ENCOUNTER — Encounter (HOSPITAL_COMMUNITY)
Admission: RE | Admit: 2014-06-04 | Discharge: 2014-06-04 | Disposition: A | Discharge status: Home / Self Care | Payer: BLUE CROSS/BLUE SHIELD | Source: Ambulatory Visit | Attending: Cardiology | Admitting: Cardiology

## 2014-06-04 DIAGNOSIS — Z48812 Encounter for surgical aftercare following surgery on the circulatory system: Secondary | ICD-10-CM | POA: Diagnosis not present

## 2014-06-04 NOTE — Progress Notes (Signed)
Patient reported feeing dizzy today while on the walking track. Blood pressure 118/76 heart rate 77 Sinus Rhythm. Exercise stopped. Sitting blood pressure 140/70. Standing blood pressure 140/80 Patient was given water No further complaints after the patient sat still for a few minutes. Alexis Hefty NP called and notified. No new order received. Will continue to monitor the patient throughout  the program. Patient left cardiac rehab without complaints.

## 2014-06-05 ENCOUNTER — Telehealth: Payer: Self-pay | Admitting: Nurse Practitioner

## 2014-06-05 ENCOUNTER — Other Ambulatory Visit: Payer: Self-pay | Admitting: *Deleted

## 2014-06-05 MED ORDER — POTASSIUM CHLORIDE CRYS ER 20 MEQ PO TBCR: 40.0000 meq | EXTENDED_RELEASE_TABLET | Freq: Three times a day (TID) | ORAL | Status: DC

## 2014-06-05 NOTE — Telephone Encounter (Signed)
New problem ° ° °Pt returning a call from nurse. Please call pt. °

## 2014-06-06 ENCOUNTER — Encounter (HOSPITAL_COMMUNITY)
Admission: RE | Admit: 2014-06-06 | Discharge: 2014-06-06 | Disposition: A | Discharge status: Home / Self Care | Payer: BLUE CROSS/BLUE SHIELD | Source: Ambulatory Visit | Attending: Cardiology | Admitting: Cardiology

## 2014-06-06 DIAGNOSIS — Z48812 Encounter for surgical aftercare following surgery on the circulatory system: Secondary | ICD-10-CM | POA: Diagnosis not present

## 2014-06-08 ENCOUNTER — Encounter (HOSPITAL_COMMUNITY)
Admission: RE | Admit: 2014-06-08 | Discharge: 2014-06-08 | Disposition: A | Discharge status: Home / Self Care | Payer: BLUE CROSS/BLUE SHIELD | Source: Ambulatory Visit | Attending: Cardiology | Admitting: Cardiology

## 2014-06-08 DIAGNOSIS — Z48812 Encounter for surgical aftercare following surgery on the circulatory system: Secondary | ICD-10-CM | POA: Diagnosis not present

## 2014-06-13 ENCOUNTER — Telehealth: Payer: Self-pay | Admitting: Cardiology

## 2014-06-13 ENCOUNTER — Encounter (HOSPITAL_COMMUNITY): Payer: BLUE CROSS/BLUE SHIELD

## 2014-06-13 NOTE — Telephone Encounter (Signed)
Pt calling to ask Truitt Merle NP and CMA if they could write a letter to inform that the pt is currently under the care of Cardiology and attends cardiac rehab and sign this, then fax this information to [employer] for pt's disability. Pt would like this faxed to  [phone].  Informed the pt that Truitt Merle and CMA are both out of the office but I will route this message to them for further review and recommendation and follow-up with the pt thereafter.  Also highly encouraged the pt that she needs to be compliant with attending cardiac rehab, for its noted in her appointments that she has no showed or cancelled several of these.  Pt verbalized understanding and agrees with this plan.

## 2014-06-13 NOTE — Telephone Encounter (Signed)
Danielle, Can you let her know that her form got faxed in - should have been with my last OV note that stated she will not be able to return to work. Please check with Maudie Mercury in Medical Records.   Thanks Aleisha Paone

## 2014-06-13 NOTE — Telephone Encounter (Signed)
Follow Up   Pt is returning call regarding fax number she left at the front desk this morning. Pt needing verification that she is still under our care sent to disability office. Please call.

## 2014-06-13 NOTE — Telephone Encounter (Signed)
New Message       Pt calling stating that she needs for Dr. Radford Pax to write something stating that she is still under a cardiologists care and for it to be sent [place] for pt's disability. Please fax to [phone]. Please call back and advise.

## 2014-06-15 ENCOUNTER — Encounter (HOSPITAL_COMMUNITY)
Admission: RE | Admit: 2014-06-15 | Discharge: 2014-06-15 | Disposition: A | Discharge status: Home / Self Care | Payer: BLUE CROSS/BLUE SHIELD | Source: Ambulatory Visit | Attending: Cardiology | Admitting: Cardiology

## 2014-06-15 DIAGNOSIS — Z95828 Presence of other vascular implants and grafts: Secondary | ICD-10-CM | POA: Insufficient documentation

## 2014-06-15 DIAGNOSIS — Z48812 Encounter for surgical aftercare following surgery on the circulatory system: Secondary | ICD-10-CM | POA: Insufficient documentation

## 2014-06-15 NOTE — Telephone Encounter (Signed)
S/w Medical records the form from [facility] was faxed in with [place] office note.  On our part that was all we had to do.  Now dds just received 152 pages on 5/18 stating pt's disability.  That has nothing to do with [place]. Pt's FMLA getting ready to end.  Pt stated will call [employer] to find out status and if needed anything from [place].  Pt stated verbal undertstanding.

## 2014-06-18 ENCOUNTER — Telehealth: Payer: Self-pay | Admitting: Nurse Practitioner

## 2014-06-18 ENCOUNTER — Encounter (HOSPITAL_COMMUNITY)
Admission: RE | Admit: 2014-06-18 | Discharge: 2014-06-18 | Disposition: A | Discharge status: Home / Self Care | Payer: BLUE CROSS/BLUE SHIELD | Source: Ambulatory Visit | Attending: Cardiology | Admitting: Cardiology

## 2014-06-18 DIAGNOSIS — Z48812 Encounter for surgical aftercare following surgery on the circulatory system: Secondary | ICD-10-CM | POA: Diagnosis not present

## 2014-06-18 NOTE — Telephone Encounter (Signed)
Letter faxed to [facility] stating that the patient has been seen by Truitt Merle, NP and will be seen by Dr. Radford Pax in July.

## 2014-06-18 NOTE — Telephone Encounter (Signed)
Walk  In pt form-Attending Physicians Statement- dropped off sent to [facility]

## 2014-06-20 ENCOUNTER — Encounter (HOSPITAL_COMMUNITY)
Admission: RE | Admit: 2014-06-20 | Discharge: 2014-06-20 | Disposition: A | Discharge status: Home / Self Care | Payer: BLUE CROSS/BLUE SHIELD | Source: Ambulatory Visit | Attending: Cardiology | Admitting: Cardiology

## 2014-06-20 DIAGNOSIS — Z48812 Encounter for surgical aftercare following surgery on the circulatory system: Secondary | ICD-10-CM | POA: Diagnosis not present

## 2014-06-22 ENCOUNTER — Encounter (HOSPITAL_COMMUNITY)
Admission: RE | Admit: 2014-06-22 | Discharge: 2014-06-22 | Disposition: A | Discharge status: Home / Self Care | Payer: BLUE CROSS/BLUE SHIELD | Source: Ambulatory Visit | Attending: Cardiology | Admitting: Cardiology

## 2014-06-22 DIAGNOSIS — Z48812 Encounter for surgical aftercare following surgery on the circulatory system: Secondary | ICD-10-CM | POA: Diagnosis not present

## 2014-06-25 ENCOUNTER — Encounter (HOSPITAL_COMMUNITY)
Admission: RE | Admit: 2014-06-25 | Discharge: 2014-06-25 | Disposition: A | Discharge status: Home / Self Care | Payer: BLUE CROSS/BLUE SHIELD | Source: Ambulatory Visit | Attending: Cardiology | Admitting: Cardiology

## 2014-06-25 DIAGNOSIS — Z48812 Encounter for surgical aftercare following surgery on the circulatory system: Secondary | ICD-10-CM | POA: Diagnosis not present

## 2014-06-26 ENCOUNTER — Ambulatory Visit: Payer: BLUE CROSS/BLUE SHIELD | Admitting: Nurse Practitioner

## 2014-06-27 ENCOUNTER — Encounter (HOSPITAL_COMMUNITY)
Admission: RE | Admit: 2014-06-27 | Discharge: 2014-06-27 | Disposition: A | Discharge status: Home / Self Care | Payer: BLUE CROSS/BLUE SHIELD | Source: Ambulatory Visit | Attending: Cardiology | Admitting: Cardiology

## 2014-06-27 DIAGNOSIS — Z48812 Encounter for surgical aftercare following surgery on the circulatory system: Secondary | ICD-10-CM | POA: Diagnosis not present

## 2014-06-27 NOTE — Progress Notes (Signed)
  30 day Psychosocial followup assessment  Patient psychosocial assessment reveals no barriers to cardiac rehab participation.   Patient  does not continue to exhibit poor coping skills to deal with psychosocial concerns. Fortunately pt has received approval for her disability therefore her financial worries are lifted.  Pt reports her faith was instrumental in carrying her through this uncertain time.   Patient does  feel making progress towards cardiac rehab goals.  Patient reports health and activity level  improved in the past 30 days as evidenced by patient's reports of ability to increased stamina at home.  Patient states family/friends  have noticed changes in inactivity or mood. Patient reports feeling positive about current and projected progress toward cardiac rehab goals.  Patient's rate of progress towards goals is good , however she will need continued reinforcement.    Plan of action to help patient continue to work towards rehab goals include continued exercise at rehab and home, encourage educational class attendance and positive reinforcement.   Pt is actively seeking hobbies at home to fill her time, she is considering sewing.   Will continue to monitor and evaluate progress toward psychosocial goals.   Goals in progress:  improved management of stress and worry.   improved coping skills   Help patient work toward returning to meaningful activities that improve patient's quality of life and are attainable.

## 2014-06-29 ENCOUNTER — Encounter (HOSPITAL_COMMUNITY): Payer: BLUE CROSS/BLUE SHIELD

## 2014-06-29 ENCOUNTER — Telehealth (HOSPITAL_COMMUNITY): Payer: Self-pay | Admitting: Internal Medicine

## 2014-07-02 ENCOUNTER — Encounter (HOSPITAL_COMMUNITY)
Admission: RE | Admit: 2014-07-02 | Discharge: 2014-07-02 | Disposition: A | Discharge status: Home / Self Care | Payer: BLUE CROSS/BLUE SHIELD | Source: Ambulatory Visit | Attending: Cardiology | Admitting: Cardiology

## 2014-07-02 DIAGNOSIS — Z48812 Encounter for surgical aftercare following surgery on the circulatory system: Secondary | ICD-10-CM | POA: Diagnosis not present

## 2014-07-04 ENCOUNTER — Encounter (HOSPITAL_COMMUNITY): Payer: BLUE CROSS/BLUE SHIELD

## 2014-07-04 ENCOUNTER — Telehealth: Payer: Self-pay | Admitting: Nurse Practitioner

## 2014-07-04 NOTE — Telephone Encounter (Signed)
S/w pt stated since disability was dragging feet pt's insurance co is cancelling policy, daughter out of town and both sisters work during the day.  Stated has no means of transportation. Just can't get to cardiac rehab

## 2014-07-04 NOTE — Telephone Encounter (Signed)
I will let them know.   Sent copy of this message to cardiac rehab.

## 2014-07-04 NOTE — Telephone Encounter (Signed)
New message       Pt want to discontinue cardiac rehab.  She does not have transportation.  Will this be ok?  If yes, who calls cardiac rehab to stop it?

## 2014-07-06 ENCOUNTER — Telehealth (HOSPITAL_COMMUNITY): Payer: Self-pay | Admitting: Cardiac Rehabilitation

## 2014-07-06 ENCOUNTER — Telehealth: Payer: Self-pay | Admitting: Nurse Practitioner

## 2014-07-06 ENCOUNTER — Encounter (HOSPITAL_COMMUNITY): Payer: BLUE CROSS/BLUE SHIELD

## 2014-07-06 NOTE — Telephone Encounter (Deleted)
Error

## 2014-07-06 NOTE — Telephone Encounter (Signed)
Message received from Truitt Merle, NP pt is requesting to discontinue cardiac rehab due to transportation concerns.  LM on pt vm to discuss.

## 2014-07-09 ENCOUNTER — Other Ambulatory Visit: Payer: Self-pay | Admitting: Cardiothoracic Surgery

## 2014-07-09 ENCOUNTER — Encounter (HOSPITAL_COMMUNITY): Payer: BLUE CROSS/BLUE SHIELD

## 2014-07-09 DIAGNOSIS — Z9889 Other specified postprocedural states: Secondary | ICD-10-CM

## 2014-07-09 NOTE — Telephone Encounter (Addendum)
Pt stated she has been having diarrhea for past 24 hours and wanted to know what she can do.  She has been eating crackers and ginger ale but should she be doing more. Told pt she needs to contact her PCP and to stay hyrated.  Pt stated she has been nauseous but has not started vomiting.   Pt also stated she asked needs her medical records sent in for disability and wants to know status of that for her disability. Told her I would check on it and get back to her.  Pt contacted and given update on any requests for records.

## 2014-07-09 NOTE — Telephone Encounter (Signed)
New Message  Pt calling about current "bug" would not specify. Please call back and discuss.

## 2014-07-11 ENCOUNTER — Encounter (HOSPITAL_COMMUNITY): Payer: BLUE CROSS/BLUE SHIELD

## 2014-07-12 ENCOUNTER — Encounter: Payer: Self-pay | Admitting: Cardiothoracic Surgery

## 2014-07-12 ENCOUNTER — Ambulatory Visit (INDEPENDENT_AMBULATORY_CARE_PROVIDER_SITE_OTHER): Payer: BLUE CROSS/BLUE SHIELD | Admitting: Cardiothoracic Surgery

## 2014-07-12 ENCOUNTER — Ambulatory Visit
Admission: RE | Admit: 2014-07-12 | Discharge: 2014-07-12 | Disposition: A | Discharge status: Home / Self Care | Payer: BLUE CROSS/BLUE SHIELD | Source: Ambulatory Visit | Attending: Cardiothoracic Surgery | Admitting: Cardiothoracic Surgery

## 2014-07-12 VITALS — BP 146/84 | HR 88 | Resp 16 | Ht 63.0 in | Wt 183.0 lb

## 2014-07-12 DIAGNOSIS — I7100 Dissection of unspecified site of aorta: Secondary | ICD-10-CM

## 2014-07-12 DIAGNOSIS — Z9889 Other specified postprocedural states: Secondary | ICD-10-CM

## 2014-07-12 DIAGNOSIS — I351 Nonrheumatic aortic (valve) insufficiency: Secondary | ICD-10-CM

## 2014-07-12 DIAGNOSIS — Z09 Encounter for follow-up examination after completed treatment for conditions other than malignant neoplasm: Secondary | ICD-10-CM

## 2014-07-12 NOTE — Progress Notes (Signed)
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 SURGICAL PROCEDURES: 1. Replacement of ascending aorta. 2. Resuspension of aortic valve  3. Right axillary artery cannulation and hypothermic circulatory circulatory arrest. SURGEON: Lanelle Bal, MD.  History of Present Illness:     Patient returns to the office following replacement of ascending aorta and resuspension air valve for a type I aortic dissection. Initially postop she had significant problems with blood pressure control, this now seems much improved on current regimen. She has no specific neurologic complaints. She still does fatigue easily. She denies any shortness of breath or other symptoms of congestive heart failure. He has now applied to social refer disability which has been granted. Because of financial issues she was unable to continue in cardiac rehabilitation. She notes that she has been having itching of her palms which she attributes to hydralazine.      Past Medical History  Diagnosis Date  . Morbid obesity   . HEMATURIA UNSPECIFIED   . GERD   . MYOSITIS   . HYPERTENSION   . HYPERLIPIDEMIA   . Vertigo      History  Smoking status  . Never Smoker   Smokeless tobacco  . 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 Ibuprofen     REACTION: Upset GI (only motrin brand)    Current Outpatient Prescriptions    Medication Sig Dispense Refill  . amLODipine (NORVASC) 10 MG tablet Take 1 tablet (10 mg total) by mouth daily. 180 tablet 3  . aspirin EC 81 MG EC tablet Take 1 tablet (81 mg total) by mouth daily.    [name] atorvastatin (LIPITOR) 40 MG tablet Take 1 tablet (40 mg total) by mouth daily at 6 PM. 30 tablet 6  . Cholecalciferol (VITAMIN D3) 1000 UNITS CAPS Take 1,000 Units by mouth daily.      . folic acid (FOLVITE) 1 MG tablet Take 1 tablet (1 mg total) by mouth daily. 30 tablet 6  . hydrALAZINE (APRESOLINE) 50 MG tablet Take 1 tablet (50 mg total) by mouth 2 (two) times daily. 60 tablet 3  . hydrochlorothiazide (HYDRODIURIL) 25 MG tablet Take 1 tablet (25 mg total) by mouth daily. 90 tablet 3  . HYDROcodone-acetaminophen (NORCO/VICODIN) 5-325 MG per tablet Take 1 tablet by mouth as needed. Every 6 hours as needed for pain  0  . lisinopril (PRINIVIL,ZESTRIL) 40 MG tablet Take 1 tablet (40 mg total) by mouth daily. 30 tablet 6  . potassium chloride SA (K-DUR,KLOR-CON) 20 MEQ tablet Take 2 tablets (40 mEq total) by mouth 3 (three) times daily. 90 tablet 3  . traMADol (ULTRAM) 50 MG tablet Take 1 tablet (50 mg total) by mouth every 6 (six) hours as needed (pain). 40 tablet 0  . ferrous sulfate 325 (65 FE) MG tablet Take 1 tablet (325 mg total)  by mouth daily with breakfast. (Patient not taking: Reported on 06/04/2014) 30 tablet 1   No current facility-administered medications for this visit.       Physical Exam: BP 146/84 mmHg  Pulse 88  Resp 16  Ht 5\' 3"  (1.6 m)  Wt 182 lb 15.7 oz (83 kg)  BMI 32.42 kg/m2  SpO2 98%  General appearance: alert and cooperative Neurologic: intact Heart: regular rate and rhythm, S1, S2 normal, no murmur, click, rub or gallop Lungs: clear to auscultation bilaterally Abdomen: soft, non-tender; bowel sounds normal; no masses,  no organomegaly Extremities: extremities normal, atraumatic, no cyanosis or edema and Homans sign is negative, no sign of DVT Wound:  sternum stable right infraclavicular incision is also well healed No carotid bruits Patient has no rash or skin changes on her hands or feet related to the itching .  Diagnostic Studies & Laboratory data:     Recent Radiology Findings:   Dg Chest 2 View  07/12/2014   CLINICAL DATA:  History of ascending aortic dissection with surgery in February of 2016, followup  EXAM: CHEST  2 VIEW  COMPARISON:  Chest x-ray of 04/12/2014  FINDINGS: The lungs remain clear. Mediastinal hilar contours are unchanged. Median sternotomy sutures are noted. The heart is mildly enlarged and stable. No bony abnormality is seen.  IMPRESSION: No active cardiopulmonary disease.  Mild cardiomegaly.   Electronically Signed   By: Ivar Drape M.D.   On: 07/12/2014 10:19      Recent Lab Findings: Lab Results  Component Value Date   WBC 5.7 05/24/2014   HGB 12.0 05/24/2014   HCT 37.3 05/24/2014   PLT 273.0 05/24/2014   GLUCOSE 147* 05/30/2014   CHOL 87 03/06/2014   TRIG 108 03/06/2014   HDL 27* 03/06/2014   LDLCALC 38 03/06/2014   ALT 20 03/02/2014   AST 31 03/02/2014   NA 138 05/30/2014   K 3.1* 05/30/2014   CL 101 05/30/2014   CREATININE 1.09 05/30/2014   BUN 17 05/30/2014   CO2 29 05/30/2014   TSH 0.64 03/11/2012   INR 0.96 03/03/2014   HGBA1C 5.8* 03/07/2014      Assessment / Plan:   Stable following acute aortic1 dissection repair, with resuspension of the aortic valve Severe hypertension currently controlled on medication Plan to see back in February 2017 approximate 1 year postop with a follow-up CTA of the chest abdomen pelvis. According to the 2010 ACC/AHA guidelines, we recommend patients with thoracic aortic disease to maintain a LDL of less than 70 and a HDL of greater than 50. We recommend their blood pressure to remain less than 135/85.     Grace Isaac MD      [address] [place] [address] Office [phone]   Beeper [phone]  07/12/2014 11:26  AM

## 2014-07-13 ENCOUNTER — Encounter (HOSPITAL_COMMUNITY): Payer: BLUE CROSS/BLUE SHIELD

## 2014-07-18 ENCOUNTER — Encounter (HOSPITAL_COMMUNITY): Payer: BLUE CROSS/BLUE SHIELD

## 2014-07-20 ENCOUNTER — Encounter (HOSPITAL_COMMUNITY): Payer: BLUE CROSS/BLUE SHIELD

## 2014-07-23 ENCOUNTER — Encounter (HOSPITAL_COMMUNITY): Payer: BLUE CROSS/BLUE SHIELD

## 2014-07-25 ENCOUNTER — Encounter (HOSPITAL_COMMUNITY): Payer: BLUE CROSS/BLUE SHIELD

## 2014-07-27 ENCOUNTER — Encounter (HOSPITAL_COMMUNITY): Payer: BLUE CROSS/BLUE SHIELD

## 2014-07-30 ENCOUNTER — Encounter (HOSPITAL_COMMUNITY): Payer: BLUE CROSS/BLUE SHIELD

## 2014-07-31 ENCOUNTER — Telehealth: Payer: Self-pay | Admitting: Cardiology

## 2014-07-31 NOTE — Telephone Encounter (Signed)
Patient is extremely upset because she was told she has to pay for her records. Informed the patient that I do not know that she needs for her disability forms, but I will have Medical Records call her in the morning to review what she needs to do to get her records. Patient is crying and wants to know why Dr. Radford Pax cannot give her the records. Explained to patient that Dr. Radford Pax has not even seen the patient yet and cannot give her records she has no access to. Patient understands she will speak with Medical Records in the morning.

## 2014-07-31 NOTE — Telephone Encounter (Signed)
New message      Calling Alexis Wheeler regarding getting her medical records for disability.  Pt is crying on the phone stating she is having trouble getting her records.  She has not received a check since may and she is very frustrated.  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

## 2014-08-01 ENCOUNTER — Encounter: Payer: Self-pay | Admitting: Cardiology

## 2014-08-01 ENCOUNTER — Encounter (HOSPITAL_COMMUNITY): Payer: BLUE CROSS/BLUE SHIELD

## 2014-08-01 DIAGNOSIS — I7103 Dissection of thoracoabdominal aorta: Secondary | ICD-10-CM | POA: Insufficient documentation

## 2014-08-01 DIAGNOSIS — I420 Dilated cardiomyopathy: Secondary | ICD-10-CM

## 2014-08-01 HISTORY — DX: Dilated cardiomyopathy: I42.0

## 2014-08-01 NOTE — Progress Notes (Signed)
Cardiology Office Note   Date:  08/02/2014   ID:  Alexis Wheeler, Alexis Wheeler 08-Nov-1955, MRN [id number]  PCP:  Unice Cobble, MD    Chief Complaint  Patient presents with  . Follow-up    Thoracoabdominal aortic dissection      History of Present Illness: Alexis Wheeler is a 59 y.o. female who presents today for a follow up visit. 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  A 2D echo was performed post op which revealed mild LV systolic dysfunction with EF 40-45%. She is on an ACE-I, HCTZ, hydralazine and amlodipine for BP control.  She was seen by the PA most recently and was weaned off her BB and started on Hydralazine.    Today she is doing well.  She denies any chest pain, SOB, DOE, LE edema, dizziness, palpitations or syncope.   Past Medical History  Diagnosis Date  . Morbid obesity   . HEMATURIA UNSPECIFIED   . GERD   . MYOSITIS   . HYPERTENSION   . HYPERLIPIDEMIA   . Vertigo   . Congestive dilated cardiomyopathy 08/01/2014    Past Surgical History  Procedure Laterality Date  . Cholecystectomy  2001  . Abdominal hysterectomy  1983  . Replacement ascending aorta N/A 03/03/2014    Procedure: REPAIR OF ASCENDING AORTIC DISSECTION;  Surgeon: Grace Isaac, MD;  Location: [facility];  Service: Open Heart Surgery;  Laterality: N/A;     Current Outpatient Prescriptions  Medication Sig Dispense Refill  . amLODipine (NORVASC) 10 MG tablet Take 1 tablet (10 mg total) by mouth daily. 180 tablet 3  . aspirin EC 81 MG EC tablet Take 1 tablet (81 mg total) by  mouth daily.    [name] atorvastatin (LIPITOR) 40 MG tablet Take 1 tablet (40 mg total) by mouth daily at 6 PM. 30 tablet 6  . Cholecalciferol 1000 UNITS capsule Take 1,000 Units by mouth daily.    . folic acid (FOLVITE) 1 MG tablet Take 1 tablet (1 mg total) by mouth daily. 30 tablet 6  . hydrALAZINE (APRESOLINE) 50 MG tablet Take 1 tablet (50 mg total) by mouth 2 (two) times daily. 60 tablet 3  . hydrochlorothiazide (HYDRODIURIL) 25 MG tablet Take 1 tablet (25 mg total) by mouth daily. 90 tablet 3  . HYDROcodone-acetaminophen (NORCO/VICODIN) 5-325 MG per tablet Take 1 tablet by mouth as needed. Every 6 hours as needed for pain  0  . lisinopril (PRINIVIL,ZESTRIL) 40 MG tablet Take 1 tablet (40 mg total) by mouth daily. 30 tablet 6  . potassium chloride SA (K-DUR,KLOR-CON) 20 MEQ tablet Take 2 tablets (40 mEq total) by mouth 3 (three) times daily. 90 tablet 3  . traMADol (ULTRAM) 50 MG tablet Take 1 tablet (50 mg total) by mouth every 6 (six) hours as needed (pain). 40 tablet 0   No current facility-administered medications for this visit.    Allergies:   Ibuprofen    Social History:  The patient  reports that she has never smoked. She does not  have any smokeless tobacco history on file. She reports that she does not drink alcohol or use illicit drugs.   Family History:  The patient's family history includes Aortic dissection (age of onset: 48) in her mother; Breast cancer in her sister; Breast cancer (age of onset: 74) in her mother; Hypertension in her daughter, father, mother, and sister; Throat cancer in her brother. There is no history of Seizures.    ROS:  Please see the history of present illness.   Otherwise, review of systems are positive for none.   All other systems are reviewed and negative.    PHYSICAL EXAM: VS:  BP 136/82 mmHg  Pulse 90  Ht 5\' 3"  (1.6 m)  Wt 180 lb 9.6 oz (81.92 kg)  BMI 32.00 kg/m2  SpO2 99% , BMI Body mass index is 32 kg/(m^2). GEN: Well nourished, well  developed, in no acute distress HEENT: normal Neck: no JVD, carotid bruits, or masses Cardiac: RRR; no murmurs, rubs, or gallops,no edema  Respiratory:  clear to auscultation bilaterally, normal work of breathing GI: soft, nontender, nondistended, + BS MS: no deformity or atrophy Skin: warm and dry, no rash Neuro:  Strength and sensation are intact Psych: euthymic mood, full affect   EKG:  EKG is not ordered today.    Recent Labs: 03/02/2014: ALT 20 03/04/2014: Magnesium 2.6* 05/24/2014: Hemoglobin 12.0; Platelets 273.0 05/30/2014: BUN 17; Creatinine, Ser 1.09; Potassium 3.1*; Sodium 138    Lipid Panel    Component Value Date/Time   CHOL 87 03/06/2014 0512   TRIG 108 03/06/2014 0512   HDL 27* 03/06/2014 0512   CHOLHDL 3.2 03/06/2014 0512   VLDL 22 03/06/2014 0512   LDLCALC 38 03/06/2014 0512      Wt Readings from Last 3 Encounters:  08/02/14 180 lb 9.6 oz (81.92 kg)  07/12/14 182 lb 15.7 oz (83 kg)  05/24/14 181 lb (82.101 kg)     Assessment/Plan: 1. 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 With her lifting restrictions and the requirements of her job, she will not be able to return. She is filing for disability.   2. HTN - controlled on amlodipine/HCTZ/ACE I and hydralazine.  Check BMET  3. Mild LV dysfunction - on ACE - She is asymptomatic.  Repeat echo in near future to reassess on medical therapy.   4.  Sternal wound tenderness with Keloid - She has been putting Vit E on it.  I will get her an appt with Dr. Servando Snare to assess.    Current medicines are reviewed at length with the patient today.  The patient does not have concerns regarding medicines.  The following changes have been made:  no change  Labs/ tests ordered today: See above Assessment and Plan No orders of the defined types were placed in this encounter.     Disposition:   FU with me in 6  months  Signed, Sueanne Margarita, MD  08/02/2014 9:33 AM    [facility] [address], [place], [place]  [address] Phone: [phone]; Fax: [phone]

## 2014-08-02 ENCOUNTER — Ambulatory Visit (INDEPENDENT_AMBULATORY_CARE_PROVIDER_SITE_OTHER): Payer: BLUE CROSS/BLUE SHIELD | Admitting: Cardiology

## 2014-08-02 ENCOUNTER — Encounter: Payer: Self-pay | Admitting: Cardiology

## 2014-08-02 ENCOUNTER — Ambulatory Visit: Payer: BLUE CROSS/BLUE SHIELD | Admitting: Cardiology

## 2014-08-02 VITALS — BP 136/82 | HR 90 | Ht 63.0 in | Wt 180.6 lb

## 2014-08-02 DIAGNOSIS — Z9889 Other specified postprocedural states: Secondary | ICD-10-CM

## 2014-08-02 DIAGNOSIS — I10 Essential (primary) hypertension: Secondary | ICD-10-CM

## 2014-08-02 DIAGNOSIS — I7103 Dissection of thoracoabdominal aorta: Secondary | ICD-10-CM

## 2014-08-02 DIAGNOSIS — L910 Hypertrophic scar: Secondary | ICD-10-CM

## 2014-08-02 DIAGNOSIS — I420 Dilated cardiomyopathy: Secondary | ICD-10-CM

## 2014-08-02 DIAGNOSIS — E876 Hypokalemia: Secondary | ICD-10-CM

## 2014-08-02 LAB — BASIC METABOLIC PANEL
BUN: 14 mg/dL (ref 6–23)
CO2: 31 mEq/L (ref 19–32)
Calcium: 9.7 mg/dL (ref 8.4–10.5)
Chloride: 102 mEq/L (ref 96–112)
Creatinine, Ser: 1.03 mg/dL (ref 0.40–1.20)
GFR: 70.32 mL/min (ref >60.00)
Glucose, Bld: 107 mg/dL — ABNORMAL HIGH (ref 70–99)
Potassium: 3.1 mEq/L — ABNORMAL LOW (ref 3.5–5.1)
Sodium: 140 mEq/L (ref 135–145)

## 2014-08-02 NOTE — Patient Instructions (Addendum)
Medication Instructions:  Your physician recommends that you continue on your current medications as directed. Please refer to the Current Medication list given to you today.   Labwork: TODAY: BMET  Testing/Procedures: Your physician has requested that you have an echocardiogram. Echocardiography is a painless test that uses sound waves to create images of your heart. It provides your doctor with information about the size and shape of your heart and how well your heart's chambers and valves are working. This procedure takes approximately one hour. There are no restrictions for this procedure.  Follow-Up: You have been referred to Dermatology.  Your physician wants you to follow-up in: 6 months with Dr. Radford Pax. You will receive a reminder letter in the mail two months in advance. If you don't receive a letter, please call our office to schedule the follow-up appointment.   Any Other Special Instructions Will Be Listed Below (If Applicable).

## 2014-08-02 NOTE — Addendum Note (Signed)
Addended by: Harland German A on: 08/02/2014 11:07 AM   Modules accepted: Orders

## 2014-08-03 ENCOUNTER — Encounter (HOSPITAL_COMMUNITY): Payer: BLUE CROSS/BLUE SHIELD

## 2014-08-03 ENCOUNTER — Ambulatory Visit (HOSPITAL_COMMUNITY): Payer: Self-pay | Attending: Cardiology

## 2014-08-03 ENCOUNTER — Other Ambulatory Visit: Payer: Self-pay

## 2014-08-03 DIAGNOSIS — I340 Nonrheumatic mitral (valve) insufficiency: Secondary | ICD-10-CM | POA: Insufficient documentation

## 2014-08-03 DIAGNOSIS — I77811 Abdominal aortic ectasia: Secondary | ICD-10-CM | POA: Insufficient documentation

## 2014-08-03 DIAGNOSIS — E785 Hyperlipidemia, unspecified: Secondary | ICD-10-CM | POA: Insufficient documentation

## 2014-08-03 DIAGNOSIS — I351 Nonrheumatic aortic (valve) insufficiency: Secondary | ICD-10-CM | POA: Insufficient documentation

## 2014-08-03 DIAGNOSIS — I420 Dilated cardiomyopathy: Secondary | ICD-10-CM

## 2014-08-03 DIAGNOSIS — I10 Essential (primary) hypertension: Secondary | ICD-10-CM | POA: Insufficient documentation

## 2014-08-03 DIAGNOSIS — Z6831 Body mass index (BMI) 31.0-31.9, adult: Secondary | ICD-10-CM | POA: Insufficient documentation

## 2014-08-03 DIAGNOSIS — I517 Cardiomegaly: Secondary | ICD-10-CM | POA: Insufficient documentation

## 2014-08-03 NOTE — Addendum Note (Signed)
Addended by: Carilyn Goodpasture on: 08/03/2014 12:48 PM   Modules accepted: Orders

## 2014-08-06 ENCOUNTER — Telehealth: Payer: Self-pay

## 2014-08-06 ENCOUNTER — Encounter (HOSPITAL_COMMUNITY): Payer: BLUE CROSS/BLUE SHIELD

## 2014-08-06 ENCOUNTER — Other Ambulatory Visit: Payer: Self-pay

## 2014-08-06 DIAGNOSIS — I77810 Thoracic aortic ectasia: Secondary | ICD-10-CM

## 2014-08-06 NOTE — Telephone Encounter (Signed)
Informed patient of results and verbal understanding expressed.  Repeat ECHO ordered for scheduling in one year. Patient agrees with treatment plan. 

## 2014-08-06 NOTE — Telephone Encounter (Signed)
-----   Message from Sueanne Margarita, MD sent at 08/03/2014  3:55 PM EDT ----- Please let patient know that echo showed normal LVF with mildly calcified AV, mildly dilated aortic root - repeat echo in 1 year for dilated aortic root.

## 2014-08-06 NOTE — Telephone Encounter (Signed)
PATIENTS INSURANCE WAS TERMED June 20,2016 - MEMBER NO LONGER WORK THERE / NO INSURANCE [employer] PER [place] # [phone]

## 2014-08-08 ENCOUNTER — Encounter (HOSPITAL_COMMUNITY): Payer: BLUE CROSS/BLUE SHIELD

## 2014-08-10 ENCOUNTER — Encounter (HOSPITAL_COMMUNITY): Payer: BLUE CROSS/BLUE SHIELD

## 2014-08-29 ENCOUNTER — Encounter: Payer: Self-pay | Admitting: Cardiology

## 2014-09-10 ENCOUNTER — Telehealth: Payer: Self-pay | Admitting: Cardiology

## 2014-09-10 NOTE — Telephone Encounter (Signed)
Patient c/o "terrible headache" every morning when she wakes up for almost 2 weeks. She has been taking her medications as directed. Last Friday, patient;s BP was 150/100.  She has not checked it since. She takes Aleve and it makes it feel better. But the HA returns each AM. She c/o no other symptoms. Instructed the patient to check her BP when she gets home and to call back with results.

## 2014-09-10 NOTE — Telephone Encounter (Signed)
New message     Pt is having bad headaches for 2 weeks  Pt c/o BP issue: STAT if pt c/o blurred vision, one-sided weakness or slurred speech  1. What are your last 5 BP readings? 150/100  Friday;   2. Are you having any other symptoms (ex. Dizziness, headache, blurred vision, passed out)? Headache  3. What is your BP issue? Running higher than normal

## 2014-09-11 NOTE — Telephone Encounter (Signed)
Left message to call back  

## 2014-09-12 NOTE — Telephone Encounter (Signed)
Patient st she is using Rogaine and she thinks that is giving her headaches. She is going to stop for a couple days and see if her aching stops.  She will call to inform us if the pain resolves. Her last three BP readings: 147/82 163/87 150/80  BP readings to Dr. Radford Pax for review.

## 2014-09-12 NOTE — Telephone Encounter (Signed)
Please have her recheck her BP once HA stops. I suspect that HAs are elevating her BP.  She needs to see her PCP for the HA

## 2014-09-13 NOTE — Telephone Encounter (Signed)
Left message for patient to call PCP if HA and HTN does not resolve. Instructed patient to call back if she has further questions or concerns.

## 2014-11-15 ENCOUNTER — Encounter: Payer: Self-pay | Admitting: Cardiology

## 2014-12-19 ENCOUNTER — Encounter (HOSPITAL_COMMUNITY): Payer: Self-pay | Admitting: Emergency Medicine

## 2014-12-19 ENCOUNTER — Observation Stay (HOSPITAL_COMMUNITY)
Admission: EM | Admit: 2014-12-19 | Discharge: 2014-12-21 | Disposition: A | Discharge status: Home / Self Care | Payer: Medicaid Other | Attending: Internal Medicine | Admitting: Internal Medicine

## 2014-12-19 DIAGNOSIS — E876 Hypokalemia: Principal | ICD-10-CM | POA: Diagnosis present

## 2014-12-19 DIAGNOSIS — I509 Heart failure, unspecified: Secondary | ICD-10-CM | POA: Insufficient documentation

## 2014-12-19 DIAGNOSIS — Z7982 Long term (current) use of aspirin: Secondary | ICD-10-CM | POA: Diagnosis not present

## 2014-12-19 DIAGNOSIS — E785 Hyperlipidemia, unspecified: Secondary | ICD-10-CM | POA: Diagnosis not present

## 2014-12-19 DIAGNOSIS — M5414 Radiculopathy, thoracic region: Secondary | ICD-10-CM | POA: Insufficient documentation

## 2014-12-19 DIAGNOSIS — I420 Dilated cardiomyopathy: Secondary | ICD-10-CM | POA: Diagnosis not present

## 2014-12-19 DIAGNOSIS — K219 Gastro-esophageal reflux disease without esophagitis: Secondary | ICD-10-CM | POA: Diagnosis present

## 2014-12-19 DIAGNOSIS — Z886 Allergy status to analgesic agent status: Secondary | ICD-10-CM | POA: Diagnosis not present

## 2014-12-19 DIAGNOSIS — I4581 Long QT syndrome: Secondary | ICD-10-CM | POA: Insufficient documentation

## 2014-12-19 DIAGNOSIS — R531 Weakness: Secondary | ICD-10-CM | POA: Insufficient documentation

## 2014-12-19 DIAGNOSIS — Z6831 Body mass index (BMI) 31.0-31.9, adult: Secondary | ICD-10-CM | POA: Diagnosis not present

## 2014-12-19 DIAGNOSIS — I2510 Atherosclerotic heart disease of native coronary artery without angina pectoris: Secondary | ICD-10-CM | POA: Diagnosis not present

## 2014-12-19 DIAGNOSIS — I119 Hypertensive heart disease without heart failure: Secondary | ICD-10-CM | POA: Diagnosis present

## 2014-12-19 DIAGNOSIS — Z9889 Other specified postprocedural states: Secondary | ICD-10-CM

## 2014-12-19 DIAGNOSIS — R9431 Abnormal electrocardiogram [ECG] [EKG]: Secondary | ICD-10-CM | POA: Diagnosis present

## 2014-12-19 DIAGNOSIS — I110 Hypertensive heart disease with heart failure: Secondary | ICD-10-CM | POA: Diagnosis not present

## 2014-12-19 DIAGNOSIS — I10 Essential (primary) hypertension: Secondary | ICD-10-CM

## 2014-12-19 LAB — CBC WITH DIFFERENTIAL/PLATELET
Basophils Absolute: 0 10*3/uL (ref 0.0–0.1)
Basophils Relative: 0 %
Eosinophils Absolute: 0.2 10*3/uL (ref 0.0–0.7)
Eosinophils Relative: 3 %
HCT: 39.6 % (ref 36.0–46.0)
Hemoglobin: 12.9 g/dL (ref 12.0–15.0)
Lymphocytes Relative: 40 %
Lymphs Abs: 3 10*3/uL (ref 0.7–4.0)
MCH: 22.7 pg — ABNORMAL LOW (ref 26.0–34.0)
MCHC: 32.6 g/dL (ref 30.0–36.0)
MCV: 69.6 fL — ABNORMAL LOW (ref 78.0–100.0)
Monocytes Absolute: 0.7 10*3/uL (ref 0.1–1.0)
Monocytes Relative: 10 %
Neutro Abs: 3.5 10*3/uL (ref 1.7–7.7)
Neutrophils Relative %: 47 %
Platelets: 221 10*3/uL (ref 150–400)
RBC: 5.69 MIL/uL — ABNORMAL HIGH (ref 3.87–5.11)
RDW: 14.9 % (ref 11.5–15.5)
WBC: 7.4 10*3/uL (ref 4.0–10.5)

## 2014-12-19 LAB — BASIC METABOLIC PANEL
Anion gap: 8 (ref 5–15)
BUN: 12 mg/dL (ref 6–20)
CO2: 33 mmol/L — ABNORMAL HIGH (ref 22–32)
Calcium: 9.3 mg/dL (ref 8.9–10.3)
Chloride: 99 mmol/L — ABNORMAL LOW (ref 101–111)
Creatinine, Ser: 1.12 mg/dL — ABNORMAL HIGH (ref 0.44–1.00)
GFR calc Af Amer: >60 mL/min (ref >60)
GFR calc non Af Amer: 52 mL/min — ABNORMAL LOW (ref >60)
Glucose, Bld: 120 mg/dL — ABNORMAL HIGH (ref 65–99)
Potassium: 2.2 mmol/L — CL (ref 3.5–5.1)
Sodium: 140 mmol/L (ref 135–145)

## 2014-12-19 MED ORDER — MAGNESIUM SULFATE 2 GM/50ML IV SOLN
2.0000 g | Freq: Once | INTRAVENOUS | Status: AC
  Administered 2014-12-20: 2 g via INTRAVENOUS
  Filled 2014-12-19: qty 50

## 2014-12-19 MED ORDER — POTASSIUM CHLORIDE CRYS ER 20 MEQ PO TBCR
40.0000 meq | EXTENDED_RELEASE_TABLET | Freq: Once | ORAL | Status: AC
  Administered 2014-12-20: 40 meq via ORAL
  Filled 2014-12-19: qty 2

## 2014-12-19 MED ORDER — POTASSIUM CHLORIDE 10 MEQ/100ML IV SOLN
10.0000 meq | INTRAVENOUS | Status: AC
  Administered 2014-12-20 (×2): 10 meq via INTRAVENOUS
  Filled 2014-12-19 (×2): qty 100

## 2014-12-19 NOTE — ED Provider Notes (Signed)
CSN: [id number]     Arrival date & time 12/19/14  2210 History  By signing my name below, I, Irene Pap, attest that this documentation has been prepared under the direction and in the presence of Merryl Hacker, MD. Electronically Signed: Irene Pap, ED Scribe. 12/19/2014. 12:47 AM.   Chief Complaint  Patient presents with  . Fatigue  . Weakness    The history is provided by the patient. No language interpreter was used.  HPI Comments: CHEVONNE DUBEL is a 59 y.o. Female with a hx of HTN, MI, aortic dissection s/p repair in Feb and congestive dilated cardiomyopathy who presents to the Emergency Department complaining of fatigue and generalized weakness onset 2 weeks ago. Pt reports associated intermittent cramping in the bilateral arms that she says has been going on "for quite some time, and I just don't feel good." Pt has not taken her HTN medication in 3 days because she has run out of her hydralazine. She has taken her Norvasc, Lipitor and potassium supplements today. She reports that she called her cardiologist for these symptoms, but was unable to get an appointment and was told to come to the ED for evaluation. She denies fever, chills, chest pain, SOB, cough, nausea, vomiting, diarrhea, or headaches.    Past Medical History  Diagnosis Date  . Morbid obesity ([employer])   . HEMATURIA UNSPECIFIED   . GERD   . MYOSITIS   . HYPERTENSION   . HYPERLIPIDEMIA   . Vertigo   . Congestive dilated cardiomyopathy ([place]) 08/01/2014   Past Surgical History  Procedure Laterality Date  . Cholecystectomy  2001  . Abdominal hysterectomy  1983  . Replacement ascending aorta N/A 03/03/2014    Procedure: REPAIR OF ASCENDING AORTIC DISSECTION;  Surgeon: Grace Isaac, MD;  Location: [facility];  Service: Open Heart Surgery;  Laterality: N/A;   Family History  Problem Relation Age of Onset  . Breast cancer Mother 7  . Breast cancer Sister   . Throat cancer Brother     1 bro living  .  Seizures Neg Hx   . Aortic dissection Mother 86  . Hypertension Mother   . Hypertension Father   . Hypertension Sister   . Hypertension Daughter    Social History  Substance Use Topics  . Smoking status: Never Smoker   . 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 Alcohol Use: No   OB History    No data available     Review of Systems  Constitutional: Positive for fatigue. Negative for fever and chills.  Respiratory: Negative for cough and shortness of breath.   Cardiovascular: Negative for chest pain and leg swelling.  Gastrointestinal: Negative for nausea, vomiting and diarrhea.  Musculoskeletal: Positive for myalgias (intermittent bilateral arm cramping).  Neurological: Negative for headaches.  All other systems reviewed and are negative.  Allergies  Ibuprofen  Home Medications   Prior to Admission medications   Medication Sig Start Date End Date Taking? Authorizing Provider  amLODipine (NORVASC) 10 MG tablet Take 1 tablet (10 mg total) by mouth daily. 03/28/14   Burtis Junes, NP  aspirin EC 81 MG EC tablet Take 1 tablet (81 mg total) by mouth daily. 03/14/14   Coolidge Breeze, PA-C  atorvastatin (LIPITOR) 40 MG tablet Take 1 tablet (40 mg total) by mouth daily at 6 PM. 05/10/14   Shanon Brow  Loren Racer, MD  Cholecalciferol 1000 UNITS capsule Take 1,000 Units by mouth daily.    Historical Provider, MD  folic acid (FOLVITE) 1 MG tablet Take 1 tablet (1 mg total) by mouth daily. 05/10/14   Leonie Man, MD  hydrALAZINE (APRESOLINE) 50 MG tablet Take 1 tablet (50 mg total) by mouth 2 (two) times daily. 05/24/14   Burtis Junes, NP  hydrochlorothiazide (HYDRODIURIL) 25 MG tablet Take 1 tablet (25 mg total) by mouth daily. 04/04/14   Burtis Junes, NP  HYDROcodone-acetaminophen (NORCO/VICODIN) 5-325 MG per tablet Take 1 tablet by mouth as needed. Every 6 hours as needed for pain 04/13/14    Historical Provider, MD  lisinopril (PRINIVIL,ZESTRIL) 40 MG tablet Take 1 tablet (40 mg total) by mouth daily. 05/10/14   Leonie Man, MD  potassium chloride SA (K-DUR,KLOR-CON) 20 MEQ tablet Take 2 tablets (40 mEq total) by mouth 3 (three) times daily. 06/05/14   Burtis Junes, NP  traMADol (ULTRAM) 50 MG tablet Take 1 tablet (50 mg total) by mouth every 6 (six) hours as needed (pain). 05/15/14   Melrose Nakayama, MD   BP 198/100 mmHg  Pulse 78  Temp(Src) 97.5 F (36.4 C) (Oral)  Resp 17  SpO2 98% Physical Exam  Constitutional: She is oriented to person, place, and time. She appears well-developed and well-nourished. No distress.  HENT:  Head: Normocephalic and atraumatic.  Mouth/Throat: Oropharynx is clear and moist.  Eyes: Pupils are equal, round, and reactive to light.  Cardiovascular: Normal rate, regular rhythm and normal heart sounds.   Pulmonary/Chest: Effort normal and breath sounds normal. No respiratory distress. She has no wheezes.  Abdominal: Soft. Bowel sounds are normal.  Musculoskeletal: She exhibits no edema.  Neurological: She is alert and oriented to person, place, and time.  Skin: Skin is warm and dry.  Psychiatric: She has a normal mood and affect.  Nursing note and vitals reviewed.   ED Course  Procedures (including critical care time) DIAGNOSTIC STUDIES: Oxygen Saturation is 99% on RA, normal by my interpretation.    COORDINATION OF CARE: 11:38 PM-Discussed treatment plan which includes labs with pt at bedside and pt agreed to plan.   Labs Review Labs Reviewed  CBC WITH DIFFERENTIAL/PLATELET - Abnormal; Notable for the following:    RBC 5.69 (*)    MCV 69.6 (*)    MCH 22.7 (*)    All other components within normal limits  BASIC METABOLIC PANEL - Abnormal; Notable for the following:    Potassium 2.2 (*)    Chloride 99 (*)    CO2 33 (*)    Glucose, Bld 120 (*)    Creatinine, Ser 1.12 (*)    GFR calc non Af Amer 52 (*)    All other  components within normal limits  URINALYSIS, ROUTINE W REFLEX MICROSCOPIC (NOT AT [facility]) - Abnormal; Notable for the following:    APPearance CLOUDY (*)    Hgb urine dipstick SMALL (*)    Protein, ur 100 (*)    Leukocytes, UA SMALL (*)    All other components within normal limits  URINE MICROSCOPIC-ADD ON - Abnormal; Notable for the following:    Squamous Epithelial / LPF 6-30 (*)    Bacteria, UA FEW (*)    All other components within normal limits    Imaging Review No results found. I have personally reviewed and evaluated these images and lab results as part of my medical decision-making.   EKG Interpretation   Date/Time:  Wednesday December 19 2014 22:27:56 EST Ventricular Rate:  76 PR Interval:  184 QRS Duration: 92 QT Interval:  444 QTC Calculation: 499 R Axis:   64 Text Interpretation:  Normal sinus rhythm Right atrial enlargement  Prolonged QT Abnormal ECG When compared with ECG of 03/22/2014, No  significant change was found Confirmed by [facility]  MD, DAVID ([id number]) on  12/19/2014 10:28:56 PM      MDM   Final diagnoses:  Essential hypertension  Hypokalemia    Patient presents with generalized fatigue, weakness, and bilateral arm cramping. Denies infectious symptoms. Does have history of cardiomyopathy and MI but denies chest pain or shortness of breath. Is clinically well appearing. Vital signs notable for blood pressure 193/107. She is out of her hydralazine. She is without headache or chest pain. EKG is reassuring.  She has a history of hypokalemia (baseline 3.0-3.1).  Today potassium is 2.2. This may explain patient's general fatigue and cramping. Her hypertension may also be contributing to her symptoms. Oral potassium to IV potassium, and magnesium were ordered. Urinalysis was added. Will recheck potassium after potassium administration. If patient is feeling better she may be able to go home. She already takes 40 mEq of potassium 3 times a day.  Dr. Radford Pax primarily  follows the patient. I spoke with her and she knows the patient well. Patient is without chest pain or shortness of breath. EKG at this time is reassuring. No further testing from a cardiac standpoint was obtained as patient is otherwise asymptomatic; however, given her blood pressure and her severe hypokalemia, Dr. Radford Pax agrees with medical admission for blood pressure control and potassium replacement. Patient may need a renal consult given persistent hypokalemia. Patient was given one 5 mg dose of hydralazine. Her pressures remained elevated. She was given a second dose and the hospitalist was consulted. It appears that her baseline blood pressure during her last cardiology visit was 130s over 80s.  I personally performed the services described in this documentation, which was scribed in my presence. The recorded information has been reviewed and is accurate.    Merryl Hacker, MD 12/20/14 [phone]

## 2014-12-19 NOTE — ED Notes (Signed)
Pt. reports fatigue /generalized weakness , states " I don't feel good" onset 2 weeks ago , hypertensive at triage , she has not taken her antihypertensive medication for 3 days .

## 2014-12-20 DIAGNOSIS — I4581 Long QT syndrome: Secondary | ICD-10-CM

## 2014-12-20 DIAGNOSIS — I420 Dilated cardiomyopathy: Secondary | ICD-10-CM

## 2014-12-20 DIAGNOSIS — K219 Gastro-esophageal reflux disease without esophagitis: Secondary | ICD-10-CM | POA: Diagnosis not present

## 2014-12-20 DIAGNOSIS — I10 Essential (primary) hypertension: Secondary | ICD-10-CM | POA: Diagnosis not present

## 2014-12-20 DIAGNOSIS — E876 Hypokalemia: Secondary | ICD-10-CM | POA: Diagnosis not present

## 2014-12-20 DIAGNOSIS — R9431 Abnormal electrocardiogram [ECG] [EKG]: Secondary | ICD-10-CM | POA: Diagnosis present

## 2014-12-20 DIAGNOSIS — Z9889 Other specified postprocedural states: Secondary | ICD-10-CM

## 2014-12-20 LAB — URINE MICROSCOPIC-ADD ON

## 2014-12-20 LAB — CBC
HCT: 39.2 % (ref 36.0–46.0)
Hemoglobin: 12.9 g/dL (ref 12.0–15.0)
MCH: 23 pg — ABNORMAL LOW (ref 26.0–34.0)
MCHC: 32.9 g/dL (ref 30.0–36.0)
MCV: 69.8 fL — ABNORMAL LOW (ref 78.0–100.0)
Platelets: 236 10*3/uL (ref 150–400)
RBC: 5.62 MIL/uL — ABNORMAL HIGH (ref 3.87–5.11)
RDW: 15 % (ref 11.5–15.5)
WBC: 8.1 10*3/uL (ref 4.0–10.5)

## 2014-12-20 LAB — URINALYSIS, ROUTINE W REFLEX MICROSCOPIC
Bilirubin Urine: NEGATIVE
Glucose, UA: NEGATIVE mg/dL
Ketones, ur: NEGATIVE mg/dL
Nitrite: NEGATIVE
Protein, ur: 100 mg/dL — AB
Specific Gravity, Urine: 1.016 (ref 1.005–1.030)
pH: 7 (ref 5.0–8.0)

## 2014-12-20 LAB — BASIC METABOLIC PANEL
Anion gap: 9 (ref 5–15)
BUN: 12 mg/dL (ref 6–20)
CO2: 29 mmol/L (ref 22–32)
Calcium: 8.6 mg/dL — ABNORMAL LOW (ref 8.9–10.3)
Chloride: 101 mmol/L (ref 101–111)
Creatinine, Ser: 0.87 mg/dL (ref 0.44–1.00)
GFR calc Af Amer: >60 mL/min (ref >60)
GFR calc non Af Amer: >60 mL/min (ref >60)
Glucose, Bld: 109 mg/dL — ABNORMAL HIGH (ref 65–99)
Potassium: 2.5 mmol/L — CL (ref 3.5–5.1)
Sodium: 139 mmol/L (ref 135–145)

## 2014-12-20 LAB — POTASSIUM: Potassium: 3.1 mmol/L — ABNORMAL LOW (ref 3.5–5.1)

## 2014-12-20 LAB — NA AND K (SODIUM & POTASSIUM), RAND UR
Potassium Urine: 70 mmol/L
Sodium, Ur: 110 mmol/L

## 2014-12-20 LAB — CREATININE, URINE, RANDOM: Creatinine, Urine: 124.63 mg/dL

## 2014-12-20 LAB — POTASSIUM, URINE RANDOM: Potassium Urine: 70 mmol/L

## 2014-12-20 LAB — MAGNESIUM: Magnesium: 2.3 mg/dL (ref 1.7–2.4)

## 2014-12-20 MED ORDER — AMLODIPINE BESYLATE 5 MG PO TABS: 10.0000 mg | ORAL_TABLET | Freq: Every day | ORAL | Status: DC

## 2014-12-20 MED ORDER — ASPIRIN EC 81 MG PO TBEC
81.0000 mg | DELAYED_RELEASE_TABLET | Freq: Every day | ORAL | Status: DC
  Administered 2014-12-20 – 2014-12-21 (×2): 81 mg via ORAL
  Filled 2014-12-20 (×2): qty 1

## 2014-12-20 MED ORDER — ONDANSETRON HCL 4 MG PO TABS: 4.0000 mg | ORAL_TABLET | Freq: Four times a day (QID) | ORAL | Status: DC | PRN

## 2014-12-20 MED ORDER — POTASSIUM CHLORIDE CRYS ER 20 MEQ PO TBCR
40.0000 meq | EXTENDED_RELEASE_TABLET | ORAL | Status: AC
  Administered 2014-12-20 (×3): 40 meq via ORAL
  Filled 2014-12-20 (×3): qty 2

## 2014-12-20 MED ORDER — ATENOLOL 50 MG PO TABS
50.0000 mg | ORAL_TABLET | Freq: Every day | ORAL | Status: DC
  Administered 2014-12-20: 50 mg via ORAL
  Filled 2014-12-20: qty 1
  Filled 2014-12-20: qty 2

## 2014-12-20 MED ORDER — ACETAMINOPHEN 325 MG PO TABS
650.0000 mg | ORAL_TABLET | Freq: Four times a day (QID) | ORAL | Status: DC | PRN
  Administered 2014-12-20: 650 mg via ORAL
  Filled 2014-12-20: qty 2

## 2014-12-20 MED ORDER — SILVER NITRATE-POT NITRATE 75-25 % EX MISC
1.0000 | Freq: Once | CUTANEOUS | Status: DC
Start: 2014-12-20 — End: 2014-12-20
  Filled 2014-12-20: qty 1

## 2014-12-20 MED ORDER — HYDROCODONE-ACETAMINOPHEN 5-325 MG PO TABS: 1.0000 | ORAL_TABLET | Freq: Four times a day (QID) | ORAL | Status: DC | PRN

## 2014-12-20 MED ORDER — TRAMADOL HCL 50 MG PO TABS: 50.0000 mg | ORAL_TABLET | Freq: Four times a day (QID) | ORAL | Status: DC | PRN

## 2014-12-20 MED ORDER — HYDRALAZINE HCL 50 MG PO TABS
50.0000 mg | ORAL_TABLET | Freq: Two times a day (BID) | ORAL | Status: DC
  Administered 2014-12-20 – 2014-12-21 (×3): 50 mg via ORAL
  Filled 2014-12-20 (×4): qty 1

## 2014-12-20 MED ORDER — POTASSIUM CHLORIDE 10 MEQ/100ML IV SOLN
10.0000 meq | INTRAVENOUS | Status: AC
  Administered 2014-12-20 (×3): 10 meq via INTRAVENOUS
  Filled 2014-12-20 (×3): qty 100

## 2014-12-20 MED ORDER — HYDRALAZINE HCL 25 MG PO TABS: 25.0000 mg | ORAL_TABLET | Freq: Three times a day (TID) | ORAL | Status: DC

## 2014-12-20 MED ORDER — HYDRALAZINE HCL 20 MG/ML IJ SOLN
5.0000 mg | Freq: Once | INTRAMUSCULAR | Status: AC
  Administered 2014-12-20: 5 mg via INTRAVENOUS
  Filled 2014-12-20: qty 1

## 2014-12-20 MED ORDER — HYDRALAZINE HCL 20 MG/ML IJ SOLN: 10.0000 mg | INTRAMUSCULAR | Status: DC | PRN

## 2014-12-20 MED ORDER — HYDROCHLOROTHIAZIDE 25 MG PO TABS: 25.0000 mg | ORAL_TABLET | Freq: Every day | ORAL | Status: DC

## 2014-12-20 MED ORDER — ONDANSETRON HCL 4 MG/2ML IJ SOLN: 4.0000 mg | Freq: Four times a day (QID) | INTRAMUSCULAR | Status: DC | PRN

## 2014-12-20 MED ORDER — LABETALOL HCL 5 MG/ML IV SOLN
20.0000 mg | Freq: Once | INTRAVENOUS | Status: AC
  Administered 2014-12-20: 20 mg via INTRAVENOUS
  Filled 2014-12-20: qty 4

## 2014-12-20 MED ORDER — SODIUM CHLORIDE 0.9 % IJ SOLN
3.0000 mL | Freq: Two times a day (BID) | INTRAMUSCULAR | Status: DC
Start: 2014-12-20 — End: 2014-12-21
  Administered 2014-12-20 – 2014-12-21 (×3): 3 mL via INTRAVENOUS

## 2014-12-20 MED ORDER — FOLIC ACID 1 MG PO TABS
1.0000 mg | ORAL_TABLET | Freq: Every day | ORAL | Status: DC
  Administered 2014-12-20 – 2014-12-21 (×2): 1 mg via ORAL
  Filled 2014-12-20 (×2): qty 1

## 2014-12-20 MED ORDER — ENOXAPARIN SODIUM 40 MG/0.4ML ~~LOC~~ SOLN
40.0000 mg | SUBCUTANEOUS | Status: DC
  Administered 2014-12-20: 40 mg via SUBCUTANEOUS
  Filled 2014-12-20 (×2): qty 0.4

## 2014-12-20 MED ORDER — POTASSIUM CHLORIDE CRYS ER 20 MEQ PO TBCR: 40.0000 meq | EXTENDED_RELEASE_TABLET | Freq: Three times a day (TID) | ORAL | Status: DC

## 2014-12-20 MED ORDER — HYDRALAZINE HCL 20 MG/ML IJ SOLN
5.0000 mg | Freq: Once | INTRAMUSCULAR | Status: AC
  Administered 2014-12-20: 5 mg via INTRAVENOUS

## 2014-12-20 MED ORDER — VITAMIN D 1000 UNITS PO TABS
1000.0000 [IU] | ORAL_TABLET | Freq: Every day | ORAL | Status: DC
Start: 2014-12-20 — End: 2014-12-21
  Administered 2014-12-20 – 2014-12-21 (×2): 1000 [IU] via ORAL
  Filled 2014-12-20 (×2): qty 1

## 2014-12-20 MED ORDER — ACETAMINOPHEN 650 MG RE SUPP: 650.0000 mg | Freq: Four times a day (QID) | RECTAL | Status: DC | PRN

## 2014-12-20 MED ORDER — ATORVASTATIN CALCIUM 40 MG PO TABS
40.0000 mg | ORAL_TABLET | Freq: Every day | ORAL | Status: DC
  Administered 2014-12-20: 40 mg via ORAL
  Filled 2014-12-20 (×2): qty 1

## 2014-12-20 MED ORDER — LISINOPRIL 40 MG PO TABS
40.0000 mg | ORAL_TABLET | Freq: Every day | ORAL | Status: DC
  Administered 2014-12-20 – 2014-12-21 (×2): 40 mg via ORAL
  Filled 2014-12-20: qty 1
  Filled 2014-12-20: qty 2

## 2014-12-20 MED ORDER — POTASSIUM CHLORIDE CRYS ER 20 MEQ PO TBCR: 40.0000 meq | EXTENDED_RELEASE_TABLET | Freq: Once | ORAL | Status: DC

## 2014-12-20 NOTE — Progress Notes (Signed)
Patient seen and examined, admitted by Dr Tamala Julian  this morning. Agree with H&P, assessment and plan outlined by admitting physician.   Labs and imagings reviewed by myself.   BP 140/77 mmHg  Pulse 70  Temp(Src) 98.4 F (36.9 C) (Oral)  Resp 16  Ht 5\' 4"  (1.626 m)  Wt 82.237 kg (181 lb 4.8 oz)  BMI 31.10 kg/m2  SpO2 100%   Assessment & Plan:    Principal Problem:   Hypokalemia: Likely due to HCTZ - Magnesium is normal, HCTZ discontinued, continue potassium replacement, recheck potassium later today  Active Problems: Accelerated hypertension - Patient reported that she is unable to afford medications, ran out off hydralazine - Placed on [facility] list, continue atenolol, hydralazine, lisinopril.    GERD - Continue PPI    S/P aortic dissection repair,  Congestive dilated cardiomyopathy (HCC) - Continue BP control, continue aspirin, beta blocker, statin, ACE inhibitor    Prolonged Q-T interval on ECG Keep calcium around 4, magnesium >2   Gabriele Loveland M.D. [facility] 12/20/2014, 12:34 PM  Pager: [phone]

## 2014-12-20 NOTE — H&P (Signed)
[facility] History and Physical  Alexis Wheeler [id number] DOB: Dec 26, 1955 DOA: 12/19/2014  Referring physician: ED PCP: Alexis Grant, MD   Chief Complaint: fatigue, weakness, and cramping  HPI:  Alexis Wheeler is a 59 year old female with past medical history significant for dilated cardiomyopathy, CHF, aortic dissection status post repair in 02/2014, hypertension, hyperlipidemia, and coronary artery disease; who presents with complaints of fatigue,  weakness, and cramping. She reports generalized malaise, fatigue, and weakness symptoms intermittently off and on, but progressively worsening over the last 2-3 weeks.  She notes additional symptoms of bilateral arm cramping that started today. Tried taking a banana without relief of symptoms. Patient claims to not have had issues with her potassium before aortic dissection surgery. Patient denied any chest pain, urinary frequency, fever, chills, nausea, or vomiting.  Patient also noticed that she's been out of her hydralazine for at least a couple weeks now. She had previously tried again on Medicaid, but did not qualify this because of her income. She would like to speak with the social worker about trying to get what is known as a red card if possible so that she can obtain her medications.  Patient with initial blood pressure of 201/108 and potassium 2.2 upon admission into the hospital. Patient was replaced in ED with 20 mEq IV of potassium chloride, 40 mEq by mouth,  and 2 g of magnesium.    Review of Systems  Constitutional: Positive for malaise/fatigue. Negative for fever, chills and weight loss.  HENT: Negative for ear discharge.   Eyes: Negative for double vision and photophobia.  Respiratory: Positive for shortness of breath. Negative for hemoptysis and sputum production.   Cardiovascular: Negative for chest pain, palpitations and leg swelling.  Gastrointestinal: Negative for nausea, vomiting, abdominal pain and  diarrhea.  Genitourinary: Negative for urgency and frequency.  Musculoskeletal:       Cramps  Skin: Negative for itching and rash.  Neurological: Positive for weakness and headaches. Negative for sensory change and speech change.  Endo/Heme/Allergies: Negative for polydipsia. Does not bruise/bleed easily.  Psychiatric/Behavioral: Negative for suicidal ideas. The patient does not have insomnia.        Past Medical History  Diagnosis Date  . Morbid obesity ([place])   . HEMATURIA UNSPECIFIED   . GERD   . MYOSITIS   . HYPERTENSION   . HYPERLIPIDEMIA   . Vertigo   . Congestive dilated cardiomyopathy ([place]) 08/01/2014     Past Surgical History  Procedure Laterality Date  . Cholecystectomy  2001  . Abdominal hysterectomy  1983  . Replacement ascending aorta N/A 03/03/2014    Procedure: REPAIR OF ASCENDING AORTIC DISSECTION;  Surgeon: Grace Isaac, MD;  Location: [facility];  Service: Open Heart Surgery;  Laterality: N/A;      Social History:  reports that she has never smoked. She does not have any smokeless tobacco history on file. She reports that she does not drink alcohol or use illicit drugs. Where does patient live--home Can patient participate in ADLs? Yes  Allergies  Allergen Reactions  . Ibuprofen     REACTION: Upset GI (only motrin brand)    Family History  Problem Relation Age of Onset  . Breast cancer Mother 66  . Breast cancer Sister   . Throat cancer Brother     1 bro living  . Seizures Neg Hx   . Aortic dissection Mother 32  . Hypertension Mother   . Hypertension Father   . Hypertension Sister   . Hypertension  Daughter        Prior to Admission medications   Medication Sig Start Date End Date Taking? Authorizing Provider  amLODipine (NORVASC) 10 MG tablet Take 1 tablet (10 mg total) by mouth daily. 03/28/14   Burtis Junes, NP  aspirin EC 81 MG EC tablet Take 1 tablet (81 mg total) by mouth daily. 03/14/14   Coolidge Breeze, PA-C  atorvastatin  (LIPITOR) 40 MG tablet Take 1 tablet (40 mg total) by mouth daily at 6 PM. 05/10/14   Leonie Man, MD  Cholecalciferol 1000 UNITS capsule Take 1,000 Units by mouth daily.    Historical Provider, MD  folic acid (FOLVITE) 1 MG tablet Take 1 tablet (1 mg total) by mouth daily. 05/10/14   Leonie Man, MD  hydrALAZINE (APRESOLINE) 50 MG tablet Take 1 tablet (50 mg total) by mouth 2 (two) times daily. 05/24/14   Burtis Junes, NP  hydrochlorothiazide (HYDRODIURIL) 25 MG tablet Take 1 tablet (25 mg total) by mouth daily. 04/04/14   Burtis Junes, NP  HYDROcodone-acetaminophen (NORCO/VICODIN) 5-325 MG per tablet Take 1 tablet by mouth as needed. Every 6 hours as needed for pain 04/13/14   Historical Provider, MD  lisinopril (PRINIVIL,ZESTRIL) 40 MG tablet Take 1 tablet (40 mg total) by mouth daily. 05/10/14   Leonie Man, MD  potassium chloride SA (K-DUR,KLOR-CON) 20 MEQ tablet Take 2 tablets (40 mEq total) by mouth 3 (three) times daily. 06/05/14   Burtis Junes, NP  traMADol (ULTRAM) 50 MG tablet Take 1 tablet (50 mg total) by mouth every 6 (six) hours as needed (pain). 05/15/14   Melrose Nakayama, MD     Physical Exam: Filed Vitals:   12/20/14 0045 12/20/14 0100 12/20/14 0105 12/20/14 0130  BP: 201/108 196/106 196/106 198/100  Pulse: 72 78  78  Temp:      TempSrc:      Resp: 15 22  17   SpO2: 100% 99%  98%     Constitutional: Vital signs reviewed. Patient is a well-developed and well-nourished in no acute distress and cooperative with exam. Alert and oriented x3.  Head: Normocephalic and atraumatic  Ear: TM normal bilaterally  Mouth: no erythema or exudates, MMM  Eyes: PERRL, EOMI, conjunctivae normal, No scleral icterus.  Neck: Supple, Trachea midline normal ROM, No JVD, mass, thyromegaly, or carotid bruit present.  Cardiovascular: RRR, S1 normal, S2 normal, positive systolic ejection murmur, pulses symmetric and intact bilaterally  Pulmonary/Chest: CTAB, no wheezes, rales, or  rhonchi  Abdominal: Soft. Non-tender, non-distended, bowel sounds are normal, no masses, organomegaly, or guarding present.  GU: no CVA tenderness Musculoskeletal: No joint deformities, erythema, or stiffness, ROM full and no nontender Ext: no edema and no cyanosis, pulses palpable bilaterally (DP and PT)  Hematology: no cervical, inginal, or axillary adenopathy.  Neurological: A&O x3, Strenght is normal and symmetric bilaterally, cranial nerve II-XII are grossly intact, no focal motor deficit, sensory intact to light touch bilaterally.  Skin: Warm, dry and intact. No rash, cyanosis, or clubbing.  Psychiatric: Normal mood and affect. speech and behavior is normal. Judgment and thought content normal. Cognition and memory are normal.      Data Review   Micro Results No results found for this or any previous visit (from the past 240 hour(s)).  Radiology Reports No results found.   CBC  Recent Labs Lab 12/19/14 2231  WBC 7.4  HGB 12.9  HCT 39.6  PLT 221  MCV 69.6*  MCH 22.7*  MCHC 32.6  RDW 14.9  LYMPHSABS 3.0  MONOABS 0.7  EOSABS 0.2  BASOSABS 0.0    Chemistries   Recent Labs Lab 12/19/14 2231  NA 140  K 2.2*  CL 99*  CO2 33*  GLUCOSE 120*  BUN 12  CREATININE 1.12*  CALCIUM 9.3   ------------------------------------------------------------------------------------------------------------------ CrCl cannot be calculated (Unknown ideal weight.). ------------------------------------------------------------------------------------------------------------------ No results for input(s): HGBA1C in the last 72 hours. ------------------------------------------------------------------------------------------------------------------ No results for input(s): CHOL, HDL, LDLCALC, TRIG, CHOLHDL, LDLDIRECT in the last 72 hours. ------------------------------------------------------------------------------------------------------------------ No results for input(s): TSH,  T4TOTAL, T3FREE, THYROIDAB in the last 72 hours.  Invalid input(s): FREET3 ------------------------------------------------------------------------------------------------------------------ No results for input(s): VITAMINB12, FOLATE, FERRITIN, TIBC, IRON, RETICCTPCT in the last 72 hours.  Coagulation profile No results for input(s): INR, PROTIME in the last 168 hours.  No results for input(s): DDIMER in the last 72 hours.  Cardiac Enzymes No results for input(s): CKMB, TROPONINI, MYOGLOBIN in the last 168 hours.  Invalid input(s): CK ------------------------------------------------------------------------------------------------------------------ Invalid input(s): POCBNP   CBG: No results for input(s): GLUCAP in the last 168 hours.     EKG: Independently reviewed. Normal sinus rhythm with right atrial enlargement Assessment/Plan Principal Problem:    Severe Hypokalemia: Acute. Patient was complaints of bilateral hand cramping and fatigue. Suspect symptoms could be related to potassium significantly low at 2.2. Patient's reports being compliant with medication regimen. Patient is reported to take 40 mEq 3 times daily. Only medication on patient's list currently that can cause hypokalemia significantly appears to be the hydrochlorothiazide. Replaced with potassium and magnesium in ER. -Recheck BMP in a.m. -Checking a spot urine potassium to creatinine ratio  Essential hypertension: Currently uncontrolled. Patient presents with blood pressure 201/108. Patient reports not being able to obtain hydralazine due to cost. -Continue patient medications of Norvasc, hydralazine, lisinopril, and hydrochlorothiazide(question a substitution secondary to patient's significant hypokalemia) -Care management consult for help and patient obtain meds. Patient wanting more information about a red card.     Congestive dilated cardiomyopathy ([place]): Stable. Patient with complaints of chest  pain. -Continue current medication regimen as seen above    S/P aortic dissection repair: Stable   Prolonged QT - QTC 499 on EKG on admission likely secondary to patient's electrolyte abnormalities. -Repeat EKG in a.m.  HLD -Continue atorvastatin   GERD - Protonix   Code Status:   full Family Communication: bedside Disposition Plan: admit   Total time spent 55 minutes.Greater than 50% of this time was spent in counseling, explanation of diagnosis, planning of further management, and coordination of care  [facility] Pager [phone]  If 7PM-7AM, please contact night-coverage [email or web address] Password [email or web address] 12/20/2014, 2:20 AM

## 2014-12-20 NOTE — ED Notes (Signed)
Regular diet order taken for lunch.

## 2014-12-20 NOTE — ED Notes (Signed)
Requested sec. Page MD for critical lab

## 2014-12-21 DIAGNOSIS — E876 Hypokalemia: Secondary | ICD-10-CM | POA: Diagnosis not present

## 2014-12-21 DIAGNOSIS — I10 Essential (primary) hypertension: Secondary | ICD-10-CM | POA: Diagnosis not present

## 2014-12-21 DIAGNOSIS — K219 Gastro-esophageal reflux disease without esophagitis: Secondary | ICD-10-CM | POA: Diagnosis not present

## 2014-12-21 DIAGNOSIS — I420 Dilated cardiomyopathy: Secondary | ICD-10-CM | POA: Diagnosis not present

## 2014-12-21 LAB — BASIC METABOLIC PANEL
Anion gap: 6 (ref 5–15)
BUN: 16 mg/dL (ref 6–20)
CO2: 30 mmol/L (ref 22–32)
Calcium: 8.5 mg/dL — ABNORMAL LOW (ref 8.9–10.3)
Chloride: 105 mmol/L (ref 101–111)
Creatinine, Ser: 1.12 mg/dL — ABNORMAL HIGH (ref 0.44–1.00)
GFR calc Af Amer: >60 mL/min (ref >60)
GFR calc non Af Amer: 52 mL/min — ABNORMAL LOW (ref >60)
Glucose, Bld: 95 mg/dL (ref 65–99)
Potassium: 3.4 mmol/L — ABNORMAL LOW (ref 3.5–5.1)
Sodium: 141 mmol/L (ref 135–145)

## 2014-12-21 LAB — MAGNESIUM: Magnesium: 2.2 mg/dL (ref 1.7–2.4)

## 2014-12-21 MED ORDER — HYDRALAZINE HCL 50 MG PO TABS: 50.0000 mg | ORAL_TABLET | Freq: Two times a day (BID) | ORAL | Status: DC

## 2014-12-21 MED ORDER — ATENOLOL 50 MG PO TABS: 50.0000 mg | ORAL_TABLET | Freq: Every day | ORAL | Status: DC

## 2014-12-21 MED ORDER — POTASSIUM CHLORIDE CRYS ER 20 MEQ PO TBCR: 40.0000 meq | EXTENDED_RELEASE_TABLET | Freq: Every day | ORAL | Status: DC

## 2014-12-21 MED ORDER — POTASSIUM CHLORIDE CRYS ER 20 MEQ PO TBCR: 40.0000 meq | EXTENDED_RELEASE_TABLET | Freq: Once | ORAL | Status: DC

## 2014-12-21 MED ORDER — POTASSIUM CHLORIDE CRYS ER 20 MEQ PO TBCR
40.0000 meq | EXTENDED_RELEASE_TABLET | Freq: Every day | ORAL | Status: DC
  Administered 2014-12-21: 40 meq via ORAL
  Filled 2014-12-21: qty 2

## 2014-12-21 MED ORDER — TRAMADOL HCL 50 MG PO TABS: 50.0000 mg | ORAL_TABLET | Freq: Four times a day (QID) | ORAL | Status: DC | PRN

## 2014-12-21 MED ORDER — LISINOPRIL 40 MG PO TABS: 40.0000 mg | ORAL_TABLET | Freq: Every day | ORAL | Status: DC

## 2014-12-21 MED ORDER — POTASSIUM CHLORIDE CRYS ER 20 MEQ PO TBCR: 20.0000 meq | EXTENDED_RELEASE_TABLET | Freq: Every day | ORAL | Status: DC

## 2014-12-21 NOTE — Discharge Summary (Signed)
Physician Discharge Summary   Patient ID: Alexis Wheeler MRN: [id number] DOB/AGE: 17-Oct-1955 59 y.o.  Admit date: 12/19/2014 Discharge date: 12/21/2014  Primary Care Physician:  [name], MD  Discharge Diagnoses:    . Hypokalemia . Essential hypertension - labile, long-standing . GERD . Congestive dilated cardiomyopathy ([place]) . Prolonged Q-T interval on ECG  Consults:  None   Recommendations for Outpatient Follow-up:  1. Patient was given prescription for her antihypertensives, placed on $4 [facility] list as she was having difficulty obtaining the medications due to financial reasons, had run out of hydralazine 2. Stop the hydrochlorothiazide due to persistent hypokalemia.    TESTS THAT NEED FOLLOW-UP Please check BMET at the time of follow-up appointment  DIET: Heart healthy diet    Allergies:   Allergies  Allergen Reactions  . Ibuprofen Other (See Comments)    REACTION: Upset GI (motrin brand causes the side effect)     DISCHARGE MEDICATIONS: Current Discharge Medication List    START taking these medications   Details  atenolol (TENORMIN) 50 MG tablet Take 1 tablet (50 mg total) by mouth daily. Qty: 90 tablet, Refills: 3      CONTINUE these medications which have CHANGED   Details  hydrALAZINE (APRESOLINE) 50 MG tablet Take 1 tablet (50 mg total) by mouth 2 (two) times daily. Qty: 180 tablet, Refills: 3    lisinopril (PRINIVIL,ZESTRIL) 40 MG tablet Take 1 tablet (40 mg total) by mouth daily. Qty: 30 tablet, Refills: 6    potassium chloride SA (K-DUR,KLOR-CON) 20 MEQ tablet Take 2 tablets (40 mEq total) by mouth daily. Qty: 60 tablet, Refills: 3    traMADol (ULTRAM) 50 MG tablet Take 1 tablet (50 mg total) by mouth every 6 (six) hours as needed for severe pain (pain). Qty: 40 tablet, Refills: 0   Associated Diagnoses: Radiculopathy of thoracic region      CONTINUE these medications which have NOT CHANGED   Details  aspirin EC 81 MG EC  tablet Take 1 tablet (81 mg total) by mouth daily.    atorvastatin (LIPITOR) 40 MG tablet Take 1 tablet (40 mg total) by mouth daily at 6 PM. Qty: 30 tablet, Refills: 6    folic acid (FOLVITE) 1 MG tablet Take 1 tablet (1 mg total) by mouth daily. Qty: 30 tablet, Refills: 6      STOP taking these medications     amLODipine (NORVASC) 10 MG tablet      hydrochlorothiazide (HYDRODIURIL) 25 MG tablet      Cholecalciferol 1000 UNITS capsule      HYDROcodone-acetaminophen (NORCO/VICODIN) 5-325 MG per tablet          Brief H and P: For complete details please refer to admission H and P, but in brief [name] Wheeler a 59 year old female with past medical history significant for dilated cardiomyopathy, CHF, aortic dissection status post repair in 02/2014, hypertension, hyperlipidemia, and coronary artery disease; who presents with complaints of fatigue, weakness, and cramping. She reports generalized malaise, fatigue, and weakness symptoms intermittently off and on, but progressively worsening over the last 2-3 weeks. She notes additional symptoms of bilateral arm cramping that started on the day of admission Tried taking a banana without relief of symptoms. Patient claims to not have had issues with her potassium before aortic dissection surgery. Patient denied any chest pain, urinary frequency, fever, chills, nausea, or vomiting.  Patient also noticed that she's been out of her hydralazine for at least a couple weeks now. She had previously tried  again on Medicaid, but did not qualify this because of her income. Patient with initial blood pressure of 201/108 and potassium 2.2 upon admission into the hospital. Patient was replaced in ED with 20 mEq IV of potassium chloride, 40 mEq by mouth, and 2 g of magnesium.  Hospital Course:    Hypokalemia: Likely due to HCTZ, potassium 2.2 at the time of admission - Magnesium Wheeler normal, HCTZ discontinued, continue potassium replacement, potassium 3.4  at the time of discharge - Please check BMET at the time of follow-up appointment   Accelerated hypertension - Patient reported that she Wheeler unable to afford medications, ran out off hydralazine - Placed on [facility] list, continue atenolol, hydralazine, lisinopril. Further adjustment outpatient.   GERD - Continue PPI   S/P aortic dissection repair, Congestive dilated cardiomyopathy (HCC) - Continue BP control, continue aspirin, beta blocker, statin, ACE inhibitor   Prolonged Q-T interval on ECG: Likely due to hypokalemia Keep K around 4, magnesium >2   Day of Discharge BP 121/61 mmHg  Pulse 59  Temp(Src) 98.2 F (36.8 C) (Oral)  Resp 18  Ht 5\' 4"  (1.626 m)  Wt 82.237 kg (181 lb 4.8 oz)  BMI 31.10 kg/m2  SpO2 97%  Physical Exam: General: Alert and awake oriented x3 not in any acute distress. HEENT: anicteric sclera, pupils reactive to light and accommodation CVS: S1-S2 clear no murmur rubs or gallops Chest: clear to auscultation bilaterally, no wheezing rales or rhonchi Abdomen: soft nontender, nondistended, normal bowel sounds Extremities: no cyanosis, clubbing or edema noted bilaterally Neuro: Cranial nerves II-XII intact, no focal neurological deficits   The results of significant diagnostics from this hospitalization (including imaging, microbiology, ancillary and laboratory) are listed below for reference.    LAB RESULTS: Basic Metabolic Panel:  Recent Labs Lab 12/20/14 0609 12/20/14 1444 12/21/14 0536  NA 139  --  141  K 2.5* 3.1* 3.4*  CL 101  --  105  CO2 29  --  30  GLUCOSE 109*  --  95  BUN 12  --  16  CREATININE 0.87  --  1.12*  CALCIUM 8.6*  --  8.5*  MG 2.3  --  2.2   Liver Function Tests: No results for input(s): AST, ALT, ALKPHOS, BILITOT, PROT, ALBUMIN in the last 168 hours. No results for input(s): LIPASE, AMYLASE in the last 168 hours. No results for input(s): AMMONIA in the last 168 hours. CBC:  Recent Labs Lab  12/19/14 2231 12/20/14 0609  WBC 7.4 8.1  NEUTROABS 3.5  --   HGB 12.9 12.9  HCT 39.6 39.2  MCV 69.6* 69.8*  PLT 221 236   Cardiac Enzymes: No results for input(s): CKTOTAL, CKMB, CKMBINDEX, TROPONINI in the last 168 hours. BNP: Invalid input(s): POCBNP CBG: No results for input(s): GLUCAP in the last 168 hours.  Significant Diagnostic Studies:  No results found.  2D ECHO:   Disposition and Follow-up: Discharge Instructions    Diet - low sodium heart healthy    Complete by:  As directed      Discharge instructions    Complete by:  As directed   Please STOP hydrochlorothiazide     Increase activity slowly    Complete by:  As directed             DISPOSITION: Home   DISCHARGE FOLLOW-UP Follow-up Information    Go to [name], MD.   Specialty:  Internal Medicine   Why:  for hospital follow-up, obtain labs for renal function/potassium. //  Appointment will be with Dr. Jodi Mourning 01/02/15 at 2pm   Contact information:   [address] [place] [address] [phone]        Time spent on Discharge: 25 minutes  Signed:   RAI,RIPUDEEP M.D. [facility] 12/21/2014, 11:35 AM Pager: [phone]

## 2014-12-21 NOTE — Care Management Note (Signed)
Case Management Note  Patient Details  Name: MAYSOON SWAFFORD MRN: [id number] Date of Birth: 25-Oct-1955  Subjective/Objective:                 Patient discharged prior to being told when appointments are. Bedside RN said she would call patient and inform her. No other CM needs.   Action/Plan:  [place] in one week for labs, [facility] later this month for follow up, appointments made. Patient stated she wished to change to [facility] bc her last doctor was no longer practicing. Expected Discharge Date:                  Expected Discharge Plan:  Home/Self Care  In-House Referral:     Discharge planning Services  CM Consult  Post Acute Care Choice:    Choice offered to:     DME Arranged:    DME Agency:     HH Arranged:    [place] Agency:     Status of Service:  Completed, signed off  Medicare Important Message Given:    Date Medicare IM Given:    Medicare IM give by:    Date Additional Medicare IM Given:    Additional Medicare Important Message give by:     If discussed at [place] of Stay Meetings, dates discussed:    Additional Comments:  Carles Collet, RN 12/21/2014, 12:24 PM

## 2014-12-21 NOTE — Progress Notes (Signed)
This Case Manager received phone call from inpatient CM who indicated patient needing hospital follow-up appointment next week as well as labwork (K+, BMET). Appointment obtained on 12/28/14 at 0900 with Zettie Pho, [place] [facility]. Carles Collet, RN CM updated. She indicated patient had already discharged, but she would call patient and notify her of upcoming appointment.

## 2014-12-28 ENCOUNTER — Ambulatory Visit (HOSPITAL_BASED_OUTPATIENT_CLINIC_OR_DEPARTMENT_OTHER): Payer: Medicaid Other | Admitting: Physician Assistant

## 2014-12-28 ENCOUNTER — Ambulatory Visit: Payer: Medicaid Other | Attending: Physician Assistant

## 2014-12-28 VITALS — BP 179/88 | HR 58 | Temp 98.0°F | Resp 16 | Ht 63.0 in | Wt 188.0 lb

## 2014-12-28 DIAGNOSIS — Z6830 Body mass index (BMI) 30.0-30.9, adult: Secondary | ICD-10-CM | POA: Insufficient documentation

## 2014-12-28 DIAGNOSIS — E876 Hypokalemia: Secondary | ICD-10-CM | POA: Diagnosis present

## 2014-12-28 DIAGNOSIS — I10 Essential (primary) hypertension: Secondary | ICD-10-CM | POA: Diagnosis not present

## 2014-12-28 DIAGNOSIS — I509 Heart failure, unspecified: Secondary | ICD-10-CM | POA: Diagnosis not present

## 2014-12-28 DIAGNOSIS — R42 Dizziness and giddiness: Secondary | ICD-10-CM | POA: Insufficient documentation

## 2014-12-28 DIAGNOSIS — Z7982 Long term (current) use of aspirin: Secondary | ICD-10-CM | POA: Diagnosis not present

## 2014-12-28 DIAGNOSIS — Z888 Allergy status to other drugs, medicaments and biological substances status: Secondary | ICD-10-CM | POA: Diagnosis not present

## 2014-12-28 DIAGNOSIS — E785 Hyperlipidemia, unspecified: Secondary | ICD-10-CM | POA: Diagnosis not present

## 2014-12-28 DIAGNOSIS — Z79899 Other long term (current) drug therapy: Secondary | ICD-10-CM | POA: Insufficient documentation

## 2014-12-28 DIAGNOSIS — K219 Gastro-esophageal reflux disease without esophagitis: Secondary | ICD-10-CM | POA: Diagnosis not present

## 2014-12-28 DIAGNOSIS — I2510 Atherosclerotic heart disease of native coronary artery without angina pectoris: Secondary | ICD-10-CM | POA: Insufficient documentation

## 2014-12-28 DIAGNOSIS — I420 Dilated cardiomyopathy: Secondary | ICD-10-CM | POA: Diagnosis not present

## 2014-12-28 LAB — BASIC METABOLIC PANEL
BUN: 12 mg/dL (ref 7–25)
CO2: 28 mmol/L (ref 20–31)
Calcium: 8.9 mg/dL (ref 8.6–10.4)
Chloride: 105 mmol/L (ref 98–110)
Creat: 1.06 mg/dL — ABNORMAL HIGH (ref 0.50–0.99)
Glucose, Bld: 93 mg/dL (ref 65–99)
Potassium: 4.1 mmol/L (ref 3.5–5.3)
Sodium: 142 mmol/L (ref 135–146)

## 2014-12-28 LAB — MAGNESIUM: Magnesium: 2 mg/dL (ref 1.5–2.5)

## 2014-12-28 MED ORDER — ATORVASTATIN CALCIUM 40 MG PO TABS: 40.0000 mg | ORAL_TABLET | Freq: Every day | ORAL | Status: DC

## 2014-12-28 MED ORDER — FOLIC ACID 1 MG PO TABS: 1.0000 mg | ORAL_TABLET | Freq: Every day | ORAL | Status: DC

## 2014-12-28 NOTE — Progress Notes (Addendum)
Alexis Wheeler  [id number]  [id number]  DOB - 09-08-1955  Chief Complaint  Patient presents with  . Hospitalization Follow-up       Subjective:   Alexis Wheeler is a 59 y.o. female here today for establishment of care and hospital follow up. She was hospitalized from 12/8-12/9/16  for hypokalemia. Her past medical history is significant for dilated cardiomyopathy (now preserved EF),CHF, aortic dissection status post repair in 02/2014, hypertension, hyperlipidemia, and coronary artery disease. She presented with complaints of fatigue, weakness, and cramping in bilateral arms for several days. She was to be on multiple antihypertensives for blood pressure control. She ran out of her lisinopril. She continued taking hydrochlorothiazide and decreased the amount of potassium that she had been taking. Her potassium was 2.2 on presentation. Her systolic blood pressure was greater than 200. She was kept overnight for better blood pressure control and IV electrolyte resuscitation.  Her potassium was 3.1 at discharge. Hydrochlorothiazide was discontinued. She was placed back on lisinopril. She was to take 40 mEq of potassium daily. She has been compliant with her regimen. She wishes to get her medications filled here.  She has no further complaints of arm cramping. She's less weak. She is less fatigued. Overall she feels 100% better.    ROS: GEN: denies fever or chills, denies change in weight HEENT: denies headache, earache, epistaxis, sore throat, or neck pain LUNGS: denies SHOB, dyspnea, PND, orthopnea CV: denies CP or palpitations ABD: denies abd pain, N or V EXT: denies muscle spasms or swelling; no pain in lower ext, no weakness NEURO: denies numbness or tingling, denies sz, stroke or TIA  ALLERGIES: Allergies  Allergen Reactions  . Ibuprofen Other (See Comments)    REACTION: Upset GI (motrin brand causes the side effect)    PAST MEDICAL HISTORY: Past Medical  History  Diagnosis Date  . Morbid obesity ([place])   . HEMATURIA UNSPECIFIED   . GERD   . MYOSITIS   . HYPERTENSION   . HYPERLIPIDEMIA   . Vertigo   . Congestive dilated cardiomyopathy ([place]) 08/01/2014    PAST SURGICAL HISTORY: Past Surgical History  Procedure Laterality Date  . Cholecystectomy  2001  . Abdominal hysterectomy  1983  . Replacement ascending aorta N/A 03/03/2014    Procedure: REPAIR OF ASCENDING AORTIC DISSECTION;  Surgeon: Grace Isaac, MD;  Location: [facility];  Service: Open Heart Surgery;  Laterality: N/A;    MEDICATIONS AT HOME: Prior to Admission medications   Medication Sig Start Date End Date Taking? Authorizing Provider  aspirin EC 81 MG EC tablet Take 1 tablet (81 mg total) by mouth daily. 03/14/14  Yes Gina L Collins, PA-C  atenolol (TENORMIN) 50 MG tablet Take 1 tablet (50 mg total) by mouth daily. 12/21/14  Yes Ripudeep Krystal Eaton, MD  atorvastatin (LIPITOR) 40 MG tablet Take 1 tablet (40 mg total) by mouth daily at 6 PM. 12/28/14  Yes Jissel Slavens Daneil Dan, PA-C  folic acid (FOLVITE) 1 MG tablet Take 1 tablet (1 mg total) by mouth daily. 12/28/14  Yes [place]ons Daneil Dan, PA-C  hydrALAZINE (APRESOLINE) 50 MG tablet Take 1 tablet (50 mg total) by mouth 2 (two) times daily. 12/21/14  Yes Ripudeep Krystal Eaton, MD  lisinopril (PRINIVIL,ZESTRIL) 40 MG tablet Take 1 tablet (40 mg total) by mouth daily. 12/21/14  Yes Ripudeep Krystal Eaton, MD  potassium chloride SA (K-DUR,KLOR-CON) 20 MEQ tablet Take 2 tablets (40 mEq total) by mouth daily. 12/21/14  Yes Ripudeep Krystal Eaton, MD  Objective:   Filed Vitals:   12/28/14 0913  BP: 179/88  Pulse: 58  Temp: 98 F (36.7 C)  TempSrc: Oral  Resp: 16  Height: 5\' 3"  (1.6 m)  Weight: 188 lb (85.276 kg)  SpO2: 98%    Exam General appearance : Awake, alert, not in any distress. Speech Clear. Not toxic looking HEENT: Atraumatic and Normocephalic, pupils equally reactive to light and accomodation Neck: supple, no JVD. No cervical lymphadenopathy.    Chest:Good air entry bilaterally, no added sounds  CVS: S1 S2 regular, no murmurs.  Extremities: B/L Lower Ext shows no edema, both legs are warm to touch Neurology: Awake alert, and oriented X 3, CN II-XII intact, Non focal   Data Review Lab Results  Component Value Date   HGBA1C 5.8* 03/07/2014     Assessment & Plan  1. Hypokalemia, replaced  -Check BMP and magnesium level today  -Continue ACE inhibitor and potassium replacement  -Continue off hydrochlorothiazide 2. Hypertension, not adequately controlled  -Continue ACE inhibitor, and atenolol, hydralazine   -Considered increasing hydralazine, did not today. She will return in 2 weeks for repeat check. I  have asked her to keep a log and bring it with her to that appointment.  -DASH diet discussed 3. Coronary artery disease, stable-continue aspirin and antihypertensives; RF modification 4. History of aortic dissection, status post repair-continue aspirin and antihypertensives; RF modification 5. Hyperlipidemia-continue Lipitor   Return in about 2 weeks (around 01/11/2015).  The patient was given clear instructions to go to ER or return to medical center if symptoms don't improve, worsen or new problems develop. The patient verbalized understanding. The patient was told to call to get lab results if they haven't heard anything in the next week.   This note has been created with Surveyor, quantity. Any transcriptional errors are unintentional.    Zettie Pho, PA-C [facility], [place]   12/28/2014, 9:30 AM

## 2014-12-28 NOTE — Progress Notes (Signed)
Patients here for f/up Hypokalemia. Patient denies pain.  Patient has no further concerns.

## 2015-01-01 ENCOUNTER — Telehealth: Payer: Self-pay

## 2015-01-01 NOTE — Telephone Encounter (Signed)
-----   Message from Brayton Caves, [place] sent at 12/31/2014 11:31 AM EST ----- Please let her know that her labs look good. Specifically, her potassium level is normal at 4.1. Thanks.  ----- Message -----    From: Lab in [facility] Interface    Sent: 12/28/2014   6:29 PM      To: Brayton Caves, PA-C

## 2015-01-01 NOTE — Telephone Encounter (Signed)
Nurse called patient, patient verified date of birth. Patient aware of good labs, potassium level normal at 4.1. Patient voices understanding and has no further questions at this time.

## 2015-01-02 ENCOUNTER — Encounter: Payer: Self-pay | Admitting: Family

## 2015-01-02 ENCOUNTER — Ambulatory Visit (INDEPENDENT_AMBULATORY_CARE_PROVIDER_SITE_OTHER): Payer: Medicaid Other | Admitting: Family

## 2015-01-02 VITALS — BP 162/90 | HR 51 | Temp 97.8°F | Resp 14 | Ht 63.0 in | Wt 185.0 lb

## 2015-01-02 DIAGNOSIS — I10 Essential (primary) hypertension: Secondary | ICD-10-CM

## 2015-01-02 DIAGNOSIS — E876 Hypokalemia: Secondary | ICD-10-CM

## 2015-01-02 MED ORDER — HYDRALAZINE HCL 50 MG PO TABS: 50.0000 mg | ORAL_TABLET | Freq: Three times a day (TID) | ORAL | Status: DC

## 2015-01-02 NOTE — Patient Instructions (Signed)
Thank you for choosing [facility].  Summary/Instructions:  Please increase your hydralazine to 3x per day.   Follow up with [facility] as scheduled.  If your symptoms worsen or fail to improve, please contact our office for further instruction, or in case of emergency go directly to the emergency room at the closest medical facility.

## 2015-01-02 NOTE — Assessment & Plan Note (Signed)
Blood pressure remains above goal 140/90 with current regimen. Denies symptoms of end organ damage. Increase hydralazine to 50 mg 3 times daily. Continue current dosages of atenolol and lisinopril. Encouraged to monitor blood pressures at home. Follow-up with community health and wellness as scheduled.

## 2015-01-02 NOTE — Progress Notes (Signed)
Subjective:    Patient ID: Alexis Wheeler, female    DOB: 1955/02/27, 59 y.o.   MRN: [id number]  Chief Complaint  Patient presents with  . [facility] follow up, doing better now that she has her medication    HPI:  Alexis Wheeler is a 59 y.o. female who  has a past medical history of Morbid obesity ([place]); HEMATURIA UNSPECIFIED; GERD; MYOSITIS; HYPERTENSION; HYPERLIPIDEMIA; Vertigo; and Congestive dilated cardiomyopathy ([place]) (08/01/2014). and presents today for an office follow up.   Recently evaluated in the emergency department and admitted to the hospital for her symptoms of fatigue, weakness, and cramping that have been going on for approximately 2-3 weeks. Initial blood pressure was noted to be 201/108 and her potassium was 2.2 upon admission. She was treated with IV potassium chloride. It was determined hypokalemia likely due to HCTZ which was discontinued. Her potassium was 3.4 at discharge. Hypertension is maintained on atenolol, hydralazine, and lisinopril. All hospital records were reviewed in detail.   Since leaving the hospital she reports that she is doing better. Takes her medications as prescribed and denies adverse side effects. Her most recent potassium was 4.1 on 12/16. Denies cramps, fatigue or weakness at this time. Reports that her function is pretty close to normal and able to complete her activities of daily living. Has not been taking her blood pressures at home and continues to take the atenolol, hydralazine, and lisinopril as prescribed.   BP Readings from Last 3 Encounters:  01/02/15 162/90  12/28/14 179/88  12/21/14 121/61    Allergies  Allergen Reactions  . Ibuprofen Other (See Comments)    REACTION: Upset GI (motrin brand causes the side effect)     Current Outpatient Prescriptions on File Prior to Visit  Medication Sig Dispense Refill  . aspirin EC 81 MG EC tablet Take 1 tablet (81 mg total) by mouth daily.    [name] atenolol  (TENORMIN) 50 MG tablet Take 1 tablet (50 mg total) by mouth daily. 90 tablet 3  . atorvastatin (LIPITOR) 40 MG tablet Take 1 tablet (40 mg total) by mouth daily at 6 PM. 30 tablet 6  . folic acid (FOLVITE) 1 MG tablet Take 1 tablet (1 mg total) by mouth daily. 30 tablet 6  . lisinopril (PRINIVIL,ZESTRIL) 40 MG tablet Take 1 tablet (40 mg total) by mouth daily. 30 tablet 6  . potassium chloride SA (K-DUR,KLOR-CON) 20 MEQ tablet Take 2 tablets (40 mEq total) by mouth daily. 60 tablet 3   No current facility-administered medications on file prior to visit.     Past Surgical History  Procedure Laterality Date  . Cholecystectomy  2001  . Abdominal hysterectomy  1983  . Replacement ascending aorta N/A 03/03/2014    Procedure: REPAIR OF ASCENDING AORTIC DISSECTION;  Surgeon: Grace Isaac, MD;  Location: [facility];  Service: Open Heart Surgery;  Laterality: N/A;     Review of Systems  Constitutional: Negative for fever, chills and fatigue.  Eyes:       Negative for changes in vision.   Respiratory: Negative for cough, chest tightness and shortness of breath.   Cardiovascular: Negative for chest pain, palpitations and leg swelling.  Neurological: Negative for headaches.      Objective:    BP 162/90 mmHg  Pulse 51  Temp(Src) 97.8 F (36.6 C) (Oral)  Resp 14  Ht 5\' 3"  (1.6 m)  Wt 185 lb (83.915 kg)  BMI 32.78 kg/m2  SpO2 99%  Nursing note and vital signs reviewed.  Physical Exam  Constitutional: She is oriented to person, place, and time. She appears well-developed and well-nourished. No distress.  Cardiovascular: Normal rate, regular rhythm, normal heart sounds and intact distal pulses.   Pulmonary/Chest: Effort normal and breath sounds normal.  Neurological: She is alert and oriented to person, place, and time.  Skin: Skin is warm and dry.  Psychiatric: She has a normal mood and affect. Her behavior is normal. Judgment and thought content normal.       Assessment & Plan:    Problem List Items Addressed This Visit      Cardiovascular and Mediastinum   Essential hypertension - labile, long-standing    Blood pressure remains above goal 140/90 with current regimen. Denies symptoms of end organ damage. Increase hydralazine to 50 mg 3 times daily. Continue current dosages of atenolol and lisinopril. Encouraged to monitor blood pressures at home. Follow-up with community health and wellness as scheduled.      Relevant Medications   hydrALAZINE (APRESOLINE) 50 MG tablet     Other   Hypokalemia - Primary    Hypokalemia resolved with previous sig metabolic panel showing potassium of 4.1. Continue current dosages of potassium and lisinopril this time. Recommend follow-up potassium in one month to determine continued need of potassium supplementation.

## 2015-01-02 NOTE — Progress Notes (Signed)
Pre visit review using our clinic review tool, if applicable. No additional management support is needed unless otherwise documented below in the visit note. 

## 2015-01-02 NOTE — Assessment & Plan Note (Signed)
Hypokalemia resolved with previous sig metabolic panel showing potassium of 4.1. Continue current dosages of potassium and lisinopril this time. Recommend follow-up potassium in one month to determine continued need of potassium supplementation.

## 2015-01-09 ENCOUNTER — Ambulatory Visit: Payer: Self-pay | Admitting: Family Medicine

## 2015-01-14 MED FILL — ?ATENOLOL 50 MG TABLET: 50 | 30 days supply | Qty: 30 | Fill #1

## 2015-01-15 MED FILL — FOLIC ACID 1 MG TABLET: 1 | 30 days supply | Qty: 30 | Fill #0

## 2015-01-15 MED FILL — ?ATORVASTATIN 40MG TABLET: 40 | 30 days supply | Qty: 30 | Fill #0

## 2015-02-04 ENCOUNTER — Telehealth: Payer: Self-pay | Admitting: Cardiology

## 2015-02-04 NOTE — Telephone Encounter (Signed)
Walk in pt form-patients needs letter-Katy back Tuesday 1/24

## 2015-02-07 ENCOUNTER — Other Ambulatory Visit: Payer: Self-pay | Admitting: Cardiothoracic Surgery

## 2015-02-07 DIAGNOSIS — I7103 Dissection of thoracoabdominal aorta: Secondary | ICD-10-CM

## 2015-02-07 MED FILL — ?LISINOPRIL 20 MG TABLET: 20 | 30 days supply | Qty: 60 | Fill #1

## 2015-02-07 MED FILL — hydrALAZINE HCL 50 MG TABS: 50 | 30 days supply | Qty: 60 | Fill #1

## 2015-02-07 MED FILL — POTASSIUM CL ER 20 MEQ TAB: 20 | 30 days supply | Qty: 60 | Fill #1

## 2015-02-07 MED FILL — ?ATENOLOL 50 MG TABLET: 50 | 25 days supply | Qty: 25 | Fill #2

## 2015-02-07 MED FILL — FOLIC ACID 1 MG TABLET: 1 | 30 days supply | Qty: 30 | Fill #1

## 2015-02-07 MED FILL — ?ATORVASTATIN 40MG TABLET: 40 | 30 days supply | Qty: 30 | Fill #1

## 2015-02-07 NOTE — Telephone Encounter (Signed)
Received walk-in form stating: "Patient st she needs a letter stating that it's okay to have service dog with her as she moves into retirement home... Please call pt for ?'s."  Called patient and instructed her to call her PCP for this matter. Patient agrees with treatment plan.

## 2015-02-15 ENCOUNTER — Other Ambulatory Visit: Payer: Self-pay | Admitting: Cardiothoracic Surgery

## 2015-02-15 DIAGNOSIS — I7103 Dissection of thoracoabdominal aorta: Secondary | ICD-10-CM

## 2015-02-21 ENCOUNTER — Ambulatory Visit (INDEPENDENT_AMBULATORY_CARE_PROVIDER_SITE_OTHER): Payer: Medicaid Other | Admitting: Cardiothoracic Surgery

## 2015-02-21 ENCOUNTER — Encounter: Payer: Self-pay | Admitting: Cardiothoracic Surgery

## 2015-02-21 ENCOUNTER — Ambulatory Visit
Admission: RE | Admit: 2015-02-21 | Discharge: 2015-02-21 | Disposition: A | Discharge status: Home / Self Care | Payer: Medicaid Other | Source: Ambulatory Visit | Attending: Cardiothoracic Surgery | Admitting: Cardiothoracic Surgery

## 2015-02-21 VITALS — BP 190/97 | HR 56 | Resp 16 | Ht 63.0 in | Wt 185.0 lb

## 2015-02-21 DIAGNOSIS — I7101 Dissection of thoracic aorta: Secondary | ICD-10-CM | POA: Diagnosis not present

## 2015-02-21 DIAGNOSIS — Z09 Encounter for follow-up examination after completed treatment for conditions other than malignant neoplasm: Secondary | ICD-10-CM

## 2015-02-21 DIAGNOSIS — I7103 Dissection of thoracoabdominal aorta: Secondary | ICD-10-CM

## 2015-02-21 DIAGNOSIS — I71019 Dissection of thoracic aorta, unspecified: Secondary | ICD-10-CM

## 2015-02-21 LAB — CREATININE, ISTAT: Creatinine, IStat: 1.1 mg/dL (ref 0.6–1.3)

## 2015-02-21 MED ORDER — IOPAMIDOL (ISOVUE-370) INJECTION 76%
75.0000 mL | Freq: Once | INTRAVENOUS | Status: AC | PRN
  Administered 2015-02-21: 75 mL via INTRAVENOUS

## 2015-02-21 NOTE — Progress Notes (Signed)
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 SURGICAL PROCEDURES: 1. Replacement of ascending aorta. 2. Resuspension of aortic valve  3. Right axillary artery cannulation and hypothermic circulatory circulatory arrest. SURGEON: Lanelle Bal, MD.  History of Present Illness:     Patient returns to the office following replacement of ascending aorta and resuspension air valve for a type I aortic dissection. Initially postop she had significant problems with blood pressure control, this now seems much improved on current regimen. She has no specific neurologic complaints. She still does fatigue easily. She denies any shortness of breath or other symptoms of congestive heart failure. He has now applied to social refer disability which has been granted. She is moving to place that is easier to care for.    Past Medical History  Diagnosis Date  . Morbid obesity ([place])   . HEMATURIA UNSPECIFIED   . GERD   . MYOSITIS   . HYPERTENSION   . HYPERLIPIDEMIA   . Vertigo   . Congestive dilated cardiomyopathy ([place]) 08/01/2014     History  Smoking status  . Never Smoker   Smokeless tobacco  . 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 Ibuprofen Other (See Comments)    REACTION: Upset GI (motrin brand causes the side effect)    Current Outpatient Prescriptions  Medication Sig Dispense  Refill  . aspirin EC 81 MG EC tablet Take 1 tablet (81 mg total) by mouth daily.    [name] atenolol (TENORMIN) 50 MG tablet Take 1 tablet (50 mg total) by mouth daily. 90 tablet 3  . atorvastatin (LIPITOR) 40 MG tablet Take 1 tablet (40 mg total) by mouth daily at 6 PM. 30 tablet 6  . folic acid (FOLVITE) 1 MG tablet Take 1 tablet (1 mg total) by mouth daily. 30 tablet 6  . hydrALAZINE (APRESOLINE) 50 MG tablet Take 1 tablet (50 mg total) by mouth 3 (three) times daily. 180 tablet 3  . lisinopril (PRINIVIL,ZESTRIL) 40 MG tablet Take 1 tablet (40 mg total) by mouth daily. 30 tablet 6  . potassium chloride SA (K-DUR,KLOR-CON) 20 MEQ tablet Take 2 tablets (40 mEq total) by mouth daily. 60 tablet 3   No current facility-administered medications for this visit.       Physical Exam: BP 190/97 mmHg  Pulse 56  Resp 16  Ht 5\' 3"  (1.6 m)  Wt 185 lb (83.915 kg)  BMI 32.78 kg/m2  SpO2 99%  General appearance: alert and cooperative Neurologic: intact Heart: regular rate and rhythm, S1, S2 normal, no murmur, click, rub or gallop Lungs: clear to auscultation bilaterally Abdomen: soft, non-tender; bowel sounds normal; no masses,  no organomegaly Extremities: extremities normal, atraumatic, no cyanosis or edema and Homans sign is negative, no sign of DVT Wound: sternum stable right infraclavicular incision is also well healed No  carotid bruits   Diagnostic Studies & Laboratory data:     Recent Radiology Findings:   Ct Angio Chest Aorta W/cm &/or Wo/cm  02/21/2015  CLINICAL DATA:  History of aortic dissection with prior surgeon EXAM: CT ANGIOGRAPHY CHEST CT ABDOMEN AND PELVIS WITH CONTRAST TECHNIQUE: Multidetector CT imaging of the chest was performed using the standard protocol during bolus administration of intravenous contrast. Multiplanar CT image reconstructions and MIPs were obtained to evaluate the vascular anatomy. Multidetector CT imaging of the abdomen and pelvis was performed using the  standard protocol during bolus administration of intravenous contrast. CONTRAST:  75 mL Isovue 370 COMPARISON:  None. FINDINGS: CTA CHEST FINDINGS There are changes consistent with the patient's known history of type 1 aortic dissection with interval surgical repair of the ascending aorta. The ascending tube graft appears well placed without extravasation. Flow into both the false and true lumens is noted. The dissection flap extends into the proximal portion of the innominate artery on the right similar that seen on the prior exam. The false lumen again supplies flow to the left common carotid artery and left subclavian artery. The lungs are well aerated bilaterally. No sizable hilar or mediastinal adenopathy is noted. Bilateral thyroid nodules are again noted stable. No bony abnormality is noted. CT ABDOMEN and PELVIS FINDINGS The liver demonstrates some diffuse decreased attenuation consistent with fatty infiltration. The gallbladder has been surgically removed. The spleen, pancreas and right adrenal gland are within normal limits. A hypodense lesion is noted arising from the left adrenal gland which is stable in appearance likely representing an adenoma. The right kidney demonstrates a normal enhancement pattern. The left kidney demonstrates decreased enhancement similar to that seen on the prior exam. The dominant left renal artery is noted arising from the true lumen and occluding just beyond its origin. An accessory left renal artery is noted arising from the false lumen and remains patent. Relative sparing of the lower pole of the left kidney is noted. A large left renal pelvis stone as well as a smaller lower pole stone are again identified and stable. The bladder is partially distended. No pelvic mass lesion is noted. Scattered diverticular change of the colon is noted. No acute bony abnormality is noted. Vascular: The false lumen of the dissection again supplies the celiac axis, superior mesenteric  artery, right renal artery and inferior mesenteric artery. Flow to these vessels is widely patent. The dissection flap extends to just above the aortic bifurcation. Some fenestrations in the distal flap are noted. Flow via the false lumen into the iliac arteries is noted bilaterally. The previously seen extension of the dissection into the left common iliac artery is no longer identified. Review of the MIP images confirms the above findings. 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 The tube graft is widely patent with flow into both the true and false lumens. The dissection flap terminates just above the aortic bifurcation with improved flow into the left iliac artery when compared with the prior exam. No new focal dissection or abnormality is noted. Visceral flow is similar to that noted on the prior exam. Left renal calculi without obstructive change. 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 No acute abnormality is noted. Electronically Signed   By: Inez Catalina M.D.   On: 02/21/2015 13:39      Recent  Lab Findings: Lab Results  Component Value Date   WBC 8.1 12/20/2014   HGB 12.9 12/20/2014   HCT 39.2 12/20/2014   PLT 236 12/20/2014   GLUCOSE 93 12/28/2014   CHOL 87 03/06/2014   TRIG 108 03/06/2014   HDL 27* 03/06/2014   LDLCALC 38 03/06/2014   ALT 20 03/02/2014   AST 31 03/02/2014   NA 142 12/28/2014   K 4.1 12/28/2014   CL 105 12/28/2014   CREATININE 1.06* 12/28/2014   BUN 12 12/28/2014   CO2 28 12/28/2014   TSH 0.64 03/11/2012   INR 0.96 03/03/2014   HGBA1C 5.8* 03/07/2014   ECHO: Study Conclusions  - Left ventricle: The cavity size was normal. There was mild focal basal hypertrophy of the septum. Systolic function was normal. The estimated ejection fraction was in the range of 55% to 60%. Wall  motion was normal; there were no regional wall motion abnormalities. There was an increased relative contribution of atrial contraction to ventricular filling. Doppler parameters are consistent with abnormal left ventricular relaxation (grade 1 diastolic dysfunction). - Aortic valve: Trileaflet; normal thickness, mildly calcified leaflets. There was trivial regurgitation. - Aorta: Aortic root dimension: 42 mm (ED). - Aortic root: The aortic root was mildly dilated. - Mitral valve: There was trivial regurgitation.   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  According to the 2010 ACC/AHA guidelines, we recommend patients with thoracic aortic disease to maintain a LDL of less than 70 and a HDL of greater than 50. We recommend their blood pressure to remain less than 135/85.     Grace Isaac MD      [address] [place] [address] Office [phone]   Beeper [phone]  02/21/2015 2:54 PM

## 2015-02-21 NOTE — Progress Notes (Signed)
Cardiology Office Note:    Date:  02/22/2015   ID:  [name], [name] 1955-09-11, MRN [id number]  PCP:  [name], MD  Cardiologist:  Dr. Fransico Him  Truitt Merle, NP) Electrophysiologist:  n/a  Chief Complaint  Patient presents with  . Follow-up    HTN; hx of aortic dissection    History of Present Illness:     Alexis Wheeler is a 60 y.o. 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  She underwent replacement of the ascending aortawith resuspension of the AV with 30 mm Hemashield Platinum graft with R axillary artery cannulation.  EF was 40-45% post op.  Last seen by Dr. Fransico Him 7/16.  Seen by Dr. Servando Snare 02/21/15.  BP was uncontrolled.  Patient is seen back for management of BP.  According to the 2010 ACC/AHA guidelines, patients with thoracic aortic disease should maintain a LDL of less than 70 and a HDL of greater than 50. It is recommended their blood pressure remain less than 135/85.   She is here for FU on HTN.  She has AM Headaches. She notes some shortness of breath if she overdoes it.  This is stable. Denies chest pain, syncope, orthopnea, PND, edema.     Past Medical History  Diagnosis Date  . Morbid obesity ([place])   . HEMATURIA UNSPECIFIED   . GERD   . MYOSITIS   . HYPERTENSION   . HYPERLIPIDEMIA   . Vertigo   . Congestive dilated cardiomyopathy ([place]) 08/01/2014    Past Surgical History  Procedure Laterality Date  . Cholecystectomy  2001  . Abdominal hysterectomy  1983  . Replacement ascending aorta N/A 03/03/2014    Procedure: REPAIR OF ASCENDING AORTIC DISSECTION;  Surgeon: Grace Isaac, MD;  Location: [facility];  Service: Open Heart Surgery;  Laterality: N/A;    Current Medications:   Outpatient Prescriptions Prior to Visit  Medication Sig Dispense Refill  . aspirin EC 81 MG EC tablet Take 1 tablet (81 mg total) by mouth  daily.    [name] atorvastatin (LIPITOR) 40 MG tablet Take 1 tablet (40 mg total) by mouth daily at 6 PM. 30 tablet 6  . folic acid (FOLVITE) 1 MG tablet Take 1 tablet (1 mg total) by mouth daily. 30 tablet 6  . hydrALAZINE (APRESOLINE) 50 MG tablet Take 1 tablet (50 mg total) by mouth 3 (three) times daily. 180 tablet 3  . lisinopril (PRINIVIL,ZESTRIL) 40 MG tablet Take 1 tablet (40 mg total) by mouth daily. 30 tablet 6  . atenolol (TENORMIN) 50 MG tablet Take 1 tablet (50 mg total) by mouth daily. 90 tablet 3  . potassium chloride SA (K-DUR,KLOR-CON) 20 MEQ tablet Take 2 tablets (40 mEq total) by mouth daily. 60 tablet 3   No facility-administered medications prior to visit.     Allergies:   Ibuprofen   Social History   Social History  . Marital Status: Divorced    Spouse Name: N/A  . Number of Children: N/A  . Years of Education: N/A   Social History Main Topics  . Smoking status: Never Smoker   . 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 Alcohol Use: No  . Drug Use: No  . Sexual Activity: No  Other Topics Concern  . None   Social History Narrative     Family History:  The patient's family history includes Aortic dissection (age of onset: 19) in her mother; Breast cancer in her sister; Breast cancer (age of onset: 54) in her mother; Hypertension in her daughter, father, mother, and sister; Throat cancer in her brother. There is no history of Seizures.   ROS:   Please see the history of present illness.    Review of Systems  Constitution: Positive for decreased appetite.  HENT: Positive for headaches.   Cardiovascular: Positive for dyspnea on exertion.  Gastrointestinal: Positive for abdominal pain and diarrhea.  All other systems reviewed and are negative.   Physical Exam:    VS:  BP 180/104 mmHg  Pulse 55  Ht 5\' 3"  (1.6 m)  Wt 183 lb 1.9 oz (83.063 kg)  BMI 32.45 kg/m2   SpO2 99%   GEN: Well nourished, well developed, in no acute distress HEENT: normal Neck: no JVD, no masses Cardiac: Normal S1/S2, RRR; no murmurs,  no edema    Respiratory:  clear to auscultation bilaterally; no wheezing, rhonchi or rales GI: soft, nontender, nondistended, + BS MS: no deformity or atrophy Skin: warm and dry, no rash Neuro:  Bilateral strength equal, no focal deficits  Psych: Alert and oriented x 3, normal affect  Wt Readings from Last 3 Encounters:  02/22/15 183 lb 1.9 oz (83.063 kg)  02/21/15 185 lb (83.915 kg)  01/02/15 185 lb (83.915 kg)      Studies/Labs Reviewed:     EKG:  EKG is  ordered today.  The ekg ordered today demonstrates sinus brady, HR 55, normal axis, QTc 457 ms, no changes.   Recent Labs: 03/02/2014: ALT 20 12/20/2014: Hemoglobin 12.9; Platelets 236 12/28/2014: BUN 12; Creat 1.06*; Magnesium 2.0; Potassium 4.1; Sodium 142   Recent Lipid Panel    Component Value Date/Time   CHOL 87 03/06/2014 0512   TRIG 108 03/06/2014 0512   HDL 27* 03/06/2014 0512   CHOLHDL 3.2 03/06/2014 0512   VLDL 22 03/06/2014 0512   LDLCALC 38 03/06/2014 0512    Additional studies/ records that were reviewed today include:   Echo 7/16 Mild focal basal septal hypertrophy, EF 55-60%, no RWMA, Gr 1 DD, trivial AI, mildly dilated aortic root (42 mm), trivial MR  Echo 2/16 EF 40-45%, mod inf-lat HK, trivial AI, PASP 46 mmHg   ASSESSMENT:     1. Hypertensive heart disease without heart failure   2. Hyperlipidemia with target LDL less than 70   3. S/P aortic dissection repair   4. Congestive dilated cardiomyopathy ([place])     PLAN:     In order of problems listed above:  1. HTN Heart Disease - BP is markedly elevated.  We discussed the importance of good BP control in light of her prior aortic dissection.  We discussed the importance of limiting salt.  She had an admission in 12/16 with profound HyperK+ and is now on K+ replacement. She used to be on HCTZ.  -   DC K+  -  Start Spironolactone 25 mg QD  -  BMET today >> in 5 days >> in 12 days  -  DC Atenolol  -  Start Carvedilol 6.25 mg bid (non-selective beta-blocker will likely control BP better)  -  FU 2 weeks.  She has seen Truitt Merle, NP a lot in the past and will FU with her.    2. HL - Continue statin.  3. S/p Aortic Dissection Repair - FU with Dr. Servando Snare as planned.     4. DCM - Recent echo with improved LVF to normal and mild diastolic dysfunction.  Continue beta-blocker, ACE inhibitor.     Medication Adjustments/Labs and Tests Ordered: Current medicines are reviewed at length with the patient today.  Concerns regarding medicines are outlined above.  Medication changes, Labs and Tests ordered today are outlined in the Patient Instructions noted below. Patient Instructions  Medication Instructions:  1. STOP POTASSIUM  2. STOP ATENOLOL 3. START SPIRONOLACTONE 25 MG DAILY 4. START CARVEDILOL 6.25 MG TWICE DAILY  Labwork: 1. TODAY BMET 2. BMET 2/15.... NEW START SPIRONOLACTONE 3. BMET 2/22 ....Marland KitchenNEW START SPIRONOLACTONE  Testing/Procedures: NONE  Follow-Up: 2 WEEKS WITH Truitt Merle, NP  Any Other Special Instructions Will Be Listed Below (If Applicable).  If you need a refill on your cardiac medications before your next appointment, please call your pharmacy.   Signed, Richardson Dopp, PA-C  02/22/2015 1:13 PM    [facility] [address], [place], [place]  [address] Phone: [phone]; Fax: [phone]

## 2015-02-22 ENCOUNTER — Encounter: Payer: Self-pay | Admitting: Physician Assistant

## 2015-02-22 ENCOUNTER — Ambulatory Visit (INDEPENDENT_AMBULATORY_CARE_PROVIDER_SITE_OTHER): Payer: Medicaid Other | Admitting: Physician Assistant

## 2015-02-22 VITALS — BP 180/104 | HR 55 | Ht 63.0 in | Wt 183.1 lb

## 2015-02-22 DIAGNOSIS — Z9889 Other specified postprocedural states: Secondary | ICD-10-CM | POA: Diagnosis not present

## 2015-02-22 DIAGNOSIS — I420 Dilated cardiomyopathy: Secondary | ICD-10-CM | POA: Diagnosis not present

## 2015-02-22 DIAGNOSIS — I119 Hypertensive heart disease without heart failure: Secondary | ICD-10-CM | POA: Diagnosis not present

## 2015-02-22 DIAGNOSIS — E785 Hyperlipidemia, unspecified: Secondary | ICD-10-CM

## 2015-02-22 LAB — BASIC METABOLIC PANEL
BUN: 14 mg/dL (ref 7–25)
CO2: 28 mmol/L (ref 20–31)
Calcium: 9.2 mg/dL (ref 8.6–10.4)
Chloride: 105 mmol/L (ref 98–110)
Creat: 1.05 mg/dL — ABNORMAL HIGH (ref 0.50–0.99)
Glucose, Bld: 98 mg/dL (ref 65–99)
Potassium: 4 mmol/L (ref 3.5–5.3)
Sodium: 140 mmol/L (ref 135–146)

## 2015-02-22 MED ORDER — CARVEDILOL 6.25 MG PO TABS: 6.2500 mg | ORAL_TABLET | Freq: Two times a day (BID) | ORAL | Status: DC

## 2015-02-22 MED ORDER — SPIRONOLACTONE 25 MG PO TABS: 25.0000 mg | ORAL_TABLET | Freq: Every day | ORAL | Status: DC

## 2015-02-22 NOTE — Patient Instructions (Addendum)
Medication Instructions:  1. STOP POTASSIUM  2. STOP ATENOLOL 3. START SPIRONOLACTONE 25 MG DAILY 4. START CARVEDILOL 6.25 MG TWICE DAILY  Labwork: 1. TODAY BMET 2. BMET 2/15.... NEW START SPIRONOLACTONE 3. BMET 2/22 ....Marland KitchenNEW START SPIRONOLACTONE  Testing/Procedures: NONE  Follow-Up: 2 WEEKS WITH Truitt Merle, NP  Any Other Special Instructions Will Be Listed Below (If Applicable).  If you need a refill on your cardiac medications before your next appointment, please call your pharmacy.

## 2015-02-25 ENCOUNTER — Telehealth: Payer: Self-pay | Admitting: *Deleted

## 2015-02-25 NOTE — Telephone Encounter (Signed)
Pt has been notified of lab results by phone with verbal understanding. 

## 2015-02-25 NOTE — Telephone Encounter (Signed)
Lmptcb for lab results 

## 2015-02-26 ENCOUNTER — Telehealth: Payer: Self-pay | Admitting: Internal Medicine

## 2015-02-26 NOTE — Telephone Encounter (Signed)
Pt called stated she has to downside of her apartment and he wants to take her dog with. The manager said that she can but she has to have a letter from the doctor stated this dog is a service dog. Please advise  # [phone]

## 2015-03-04 ENCOUNTER — Telehealth: Payer: Self-pay | Admitting: Nurse Practitioner

## 2015-03-04 NOTE — Telephone Encounter (Signed)
DPR on file for daughter Ailene Ards who states pt is going into an appt for the "elderly" and is asking for Truitt Merle, NP if she would write a letter stating dog Zoe (poodle) can be used as a service dog since the dog helps pt get out and walk. I advised daughter Ailene Ards that I will express her request to Truitt Merle, NP. Shelica thanked me for my help. Pt has seen Truitt Merle, NP and Richardson Dopp, PAC in the past.

## 2015-03-04 NOTE — Telephone Encounter (Signed)
New message      Talk to the nurse about a letter she needs written.

## 2015-03-05 ENCOUNTER — Telehealth: Payer: Self-pay | Admitting: Nurse Practitioner

## 2015-03-05 NOTE — Telephone Encounter (Signed)
Returning a call from this morning.

## 2015-03-05 NOTE — Telephone Encounter (Signed)
I s/w pt this morning. I advised per Cecille Rubin gerhardt, NP, that she will not be able to write a letter in regards having poodle as a service dog for apartment that she is moving into. Pt advised to est w/PCP, also search internet about service dog protocol. Pt verbalized understanding. I did state she could try going to [facility] to see a PCP until established with a PCP. Pt said ok and thank you.

## 2015-03-05 NOTE — Telephone Encounter (Signed)
Alexis Wheeler, please advise

## 2015-03-05 NOTE — Telephone Encounter (Signed)
What is the reason the service dog is needed that is usually required as part of the letter.

## 2015-03-05 NOTE — Telephone Encounter (Signed)
I s/w pt this morning. I advised per Alexis Rubin gerhardt, NP, that she will not be able to write a letter in regards having poodle as a service dog for apartment that she is moving into. Pt advised to est w/PCP, also search internet about service dog protocol. Pt verbalized understanding. I did state she could try going to [facility] to see a PCP until established with a PCP. Pt said ok and thank you.

## 2015-03-05 NOTE — Telephone Encounter (Signed)
Agree Richardson Dopp, PA-C   03/05/2015 11:01 AM

## 2015-03-05 NOTE — Telephone Encounter (Signed)
I think she is going to need a more formal certification - I am not able to do this.

## 2015-03-05 NOTE — Telephone Encounter (Signed)
Pt called back regarding the encounter below Please give her a call at [phone]

## 2015-03-06 NOTE — Telephone Encounter (Signed)
Pt states that she needs the dog for instances like if she is asleep and she does not hear the door or someone is messing around her house the dog will bark and let her know. The dog is a protective dog.

## 2015-03-07 NOTE — Progress Notes (Signed)
Cardiology Office Note:    Date:  03/08/2015   ID:  Alexis Wheeler, Alexis Wheeler 1955-12-27, MRN [id number]  PCP:  [name], MD  Cardiologist:  Dr. Fransico Him  Truitt Merle, NP) Electrophysiologist:  n/a  Chief Complaint  Patient presents with  . Follow-up    Hypertension    History of Present Illness:     Alexis Wheeler is a 60 y.o. 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  She underwent replacement of the ascending aortawith resuspension of the AV with 30 mm Hemashield Platinum graft with R axillary artery cannulation.  EF was 40-45% post op.  Last seen by Dr. Fransico Him 7/16.  Seen by Dr. Servando Snare 02/21/15.  BP was uncontrolled.  She was referred back for management of BP. I saw her 02/22/15. I placed her on spironolactone and changed her atenolol to carvedilol.    According to the 2010 ACC/AHA guidelines, patients with thoracic aortic disease should maintain a LDL of less than 70 and a HDL of greater than 50. It is recommended their blood pressure remain less than 135/85.   She returns for FU.  She feels fatigued.  Also notes HAs.  Denies chest pain, syncope, orthopnea, PND, edema.     Past Medical History  Diagnosis Date  . Morbid obesity ([place])   . HEMATURIA UNSPECIFIED   . GERD   . MYOSITIS   . HYPERTENSION   . HYPERLIPIDEMIA   . Vertigo   . Congestive dilated cardiomyopathy ([place]) 08/01/2014    Past Surgical History  Procedure Laterality Date  . Cholecystectomy  2001  . Abdominal hysterectomy  1983  . Replacement ascending aorta N/A 03/03/2014    Procedure: REPAIR OF ASCENDING AORTIC DISSECTION;  Surgeon: Grace Isaac, MD;  Location: [place];  Service: Open Heart Surgery;  Laterality: N/A;    Current Medications:   Outpatient Prescriptions Prior to Visit  Medication Sig Dispense Refill  . aspirin EC 81 MG EC tablet Take 1 tablet (81 mg  total) by mouth daily.    [name] atorvastatin (LIPITOR) 40 MG tablet Take 1 tablet (40 mg total) by mouth daily at 6 PM. 30 tablet 6  . carvedilol (COREG) 6.25 MG tablet Take 1 tablet (6.25 mg total) by mouth 2 (two) times daily. 60 tablet 11  . folic acid (FOLVITE) 1 MG tablet Take 1 tablet (1 mg total) by mouth daily. 30 tablet 6  . lisinopril (PRINIVIL,ZESTRIL) 40 MG tablet Take 1 tablet (40 mg total) by mouth daily. 30 tablet 6  . spironolactone (ALDACTONE) 25 MG tablet Take 1 tablet (25 mg total) by mouth daily. 30 tablet 11  . hydrALAZINE (APRESOLINE) 50 MG tablet Take 1 tablet (50 mg total) by mouth 3 (three) times daily. 180 tablet 3   No facility-administered medications prior to visit.     Allergies:   Ibuprofen   Social History   Social History  . Marital Status: Divorced    Spouse Name: N/A  . Number of Children: N/A  . Years of Education: N/A   Social History Main Topics  . Smoking status: Never Smoker   . 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 Alcohol Use: No  . Drug Use: No  . Sexual Activity:  No   Other Topics Concern  . None   Social History Narrative     Family History:  The patient's family history includes Aortic dissection (age of onset: 2) in her mother; Breast cancer in her sister; Breast cancer (age of onset: 8) in her mother; Hypertension in her daughter, father, mother, and sister; Throat cancer in her brother. There is no history of Seizures.   ROS:   Please see the history of present illness.    Review of Systems  Constitution: Positive for decreased appetite and malaise/fatigue.  HENT: Positive for headaches.   Cardiovascular: Positive for dyspnea on exertion.  All other systems reviewed and are negative.   Physical Exam:    VS:  BP 160/80 mmHg  Pulse 62  Ht 5\' 3"  (1.6 m)  Wt 184 lb 1.9 oz (83.516 kg)  BMI 32.62 kg/m2   GEN: Well nourished,  well developed, in no acute distress HEENT: normal Neck: no JVD, no masses Cardiac: Normal S1/S2, RRR; no murmurs,  no edema    Respiratory:  clear to auscultation bilaterally; no wheezing, rhonchi or rales GI: soft, nontender, nondistended, + BS MS: no deformity or atrophy Skin: warm and dry, no rash Neuro:  Bilateral strength equal, no focal deficits  Psych: Alert and oriented x 3, normal affect  Wt Readings from Last 3 Encounters:  03/08/15 184 lb 1.9 oz (83.516 kg)  02/22/15 183 lb 1.9 oz (83.063 kg)  02/21/15 185 lb (83.915 kg)      Studies/Labs Reviewed:     EKG:  EKG is  ordered today.  The ekg ordered today demonstrates NSR, HR 61, normal axis, QTc 469 ms, no changes.  Recent Labs: 12/20/2014: Hemoglobin 12.9; Platelets 236 12/28/2014: Magnesium 2.0 02/22/2015: BUN 14; Creat 1.05*; Potassium 4.0; Sodium 140   Recent Lipid Panel    Component Value Date/Time   CHOL 87 03/06/2014 0512   TRIG 108 03/06/2014 0512   HDL 27* 03/06/2014 0512   CHOLHDL 3.2 03/06/2014 0512   VLDL 22 03/06/2014 0512   LDLCALC 38 03/06/2014 0512    Additional studies/ records that were reviewed today include:   Echo 7/16 Mild focal basal septal hypertrophy, EF 55-60%, no RWMA, Gr 1 DD, trivial AI, mildly dilated aortic root (42 mm), trivial MR  Echo 2/16 EF 40-45%, mod inf-lat HK, trivial AI, PASP 46 mmHg   ASSESSMENT:     1. Hypertensive heart disease without heart failure   2. Hyperlipidemia with target LDL less than 70   3. S/P aortic dissection repair   4. Dilated cardiomyopathy ([place])   5. Other fatigue     PLAN:     In order of problems listed above:  1. HTN Heart Disease - BP is improved.  She is not quite to goal.  She used to be on Amlodipine with well controlled BP. She was taken off of this due to cost. She now has Medicaid.  Continue beta-blocker, spironolactone, Lisinopril.  Increase Hydralazine to 75 mg TID.  If she does not get to goal with max dose Hydralazine,  will need to add Amlodipine back.   She has seen Truitt Merle, NP a lot in the past and will FU with her.  FU BMET today.   2. HL - Continue statin.    3. S/p Aortic Dissection Repair - FU with Dr. Servando Snare as planned.     4. DCM - Recent echo with improved LVF to normal and mild diastolic dysfunction.  Continue beta-blocker, ACE inhibitor.  5. Fatigue - Check TSH, CBC today.    Medication Adjustments/Labs and Tests Ordered: Current medicines are reviewed at length with the patient today.  Concerns regarding medicines are outlined above.  Medication changes, Labs and Tests ordered today are outlined in the Patient Instructions noted below. Patient Instructions  Medication Instructions:  INCREASE your Hydralazine to 75 mg Three times a day  You will now take 1 and 1/2 tablets of the 50 mg tablet to make 75 mg. A new prescription was sent to your pharmacy for 90 days.  Labwork: TODAY - BMET, TSH, CBC  Testing/Procedures: None   Follow-Up: Truitt Merle, NP in 3 weeks.  Any Other Special Instructions Will Be Listed Below (If Applicable).  If you need a refill on your cardiac medications before your next appointment, please call your pharmacy.    Signed, Richardson Dopp, PA-C  03/08/2015 9:26 AM    [facility] [place], [place], [place]  [address] Phone: [phone]; Fax: [phone]

## 2015-03-08 ENCOUNTER — Encounter: Payer: Self-pay | Admitting: Physician Assistant

## 2015-03-08 ENCOUNTER — Ambulatory Visit (INDEPENDENT_AMBULATORY_CARE_PROVIDER_SITE_OTHER): Payer: Medicaid Other | Admitting: Physician Assistant

## 2015-03-08 ENCOUNTER — Telehealth: Payer: Self-pay | Admitting: *Deleted

## 2015-03-08 VITALS — BP 160/80 | HR 62 | Ht 63.0 in | Wt 184.1 lb

## 2015-03-08 DIAGNOSIS — Z9889 Other specified postprocedural states: Secondary | ICD-10-CM | POA: Diagnosis not present

## 2015-03-08 DIAGNOSIS — I420 Dilated cardiomyopathy: Secondary | ICD-10-CM | POA: Diagnosis not present

## 2015-03-08 DIAGNOSIS — E785 Hyperlipidemia, unspecified: Secondary | ICD-10-CM | POA: Diagnosis not present

## 2015-03-08 DIAGNOSIS — I119 Hypertensive heart disease without heart failure: Secondary | ICD-10-CM

## 2015-03-08 DIAGNOSIS — R5383 Other fatigue: Secondary | ICD-10-CM

## 2015-03-08 LAB — CBC WITH DIFFERENTIAL/PLATELET
Basophils Absolute: 0 10*3/uL (ref 0.0–0.1)
Basophils Relative: 0 % (ref 0–1)
Eosinophils Absolute: 0.3 10*3/uL (ref 0.0–0.7)
Eosinophils Relative: 4 % (ref 0–5)
HCT: 39.9 % (ref 36.0–46.0)
Hemoglobin: 12.7 g/dL (ref 12.0–15.0)
Lymphocytes Relative: 32 % (ref 12–46)
Lymphs Abs: 2 10*3/uL (ref 0.7–4.0)
MCH: 22.4 pg — ABNORMAL LOW (ref 26.0–34.0)
MCHC: 31.8 g/dL (ref 30.0–36.0)
MCV: 70.5 fL — ABNORMAL LOW (ref 78.0–100.0)
MPV: 10.3 fL (ref 8.6–12.4)
Monocytes Absolute: 0.5 10*3/uL (ref 0.1–1.0)
Monocytes Relative: 8 % (ref 3–12)
Neutro Abs: 3.6 10*3/uL (ref 1.7–7.7)
Neutrophils Relative %: 56 % (ref 43–77)
Platelets: 227 10*3/uL (ref 150–400)
RBC: 5.66 MIL/uL — ABNORMAL HIGH (ref 3.87–5.11)
RDW: 16.4 % — ABNORMAL HIGH (ref 11.5–15.5)
WBC: 6.4 10*3/uL (ref 4.0–10.5)

## 2015-03-08 LAB — BASIC METABOLIC PANEL
BUN: 20 mg/dL (ref 7–25)
CO2: 26 mmol/L (ref 20–31)
Calcium: 9 mg/dL (ref 8.6–10.4)
Chloride: 107 mmol/L (ref 98–110)
Creat: 1.18 mg/dL — ABNORMAL HIGH (ref 0.50–0.99)
Glucose, Bld: 100 mg/dL — ABNORMAL HIGH (ref 65–99)
Potassium: 4 mmol/L (ref 3.5–5.3)
Sodium: 141 mmol/L (ref 135–146)

## 2015-03-08 LAB — TSH: TSH: 0.78 mIU/L

## 2015-03-08 MED ORDER — HYDRALAZINE HCL 50 MG PO TABS: 75.0000 mg | ORAL_TABLET | Freq: Three times a day (TID) | ORAL | Status: DC

## 2015-03-08 NOTE — Patient Instructions (Signed)
Medication Instructions:  INCREASE your Hydralazine to 75 mg Three times a day  You will now take 1 and 1/2 tablets of the 50 mg tablet to make 75 mg. A new prescription was sent to your pharmacy for 90 days.  Labwork: TODAY - BMET, TSH, CBC  Testing/Procedures: None   Follow-Up: Truitt Merle, NP in 3 weeks.  Any Other Special Instructions Will Be Listed Below (If Applicable).  If you need a refill on your cardiac medications before your next appointment, please call your pharmacy.

## 2015-03-08 NOTE — Telephone Encounter (Signed)
Pt notified of lab results by phone with verbal understanding.  

## 2015-03-11 ENCOUNTER — Encounter: Payer: Self-pay | Admitting: Family

## 2015-03-11 NOTE — Telephone Encounter (Signed)
Pt has called back regarding the encounters below

## 2015-03-11 NOTE — Telephone Encounter (Signed)
I will write a letter stating that the dog is a protective dog, but cannot use the term service dog.

## 2015-03-12 NOTE — Telephone Encounter (Signed)
Called pt to let her know a letter is ready for pick up.

## 2015-04-12 ENCOUNTER — Ambulatory Visit (INDEPENDENT_AMBULATORY_CARE_PROVIDER_SITE_OTHER): Payer: Medicaid Other | Admitting: Nurse Practitioner

## 2015-04-12 ENCOUNTER — Encounter: Payer: Self-pay | Admitting: Nurse Practitioner

## 2015-04-12 VITALS — BP 200/110 | HR 60 | Ht 63.0 in | Wt 181.8 lb

## 2015-04-12 DIAGNOSIS — R5383 Other fatigue: Secondary | ICD-10-CM

## 2015-04-12 DIAGNOSIS — E785 Hyperlipidemia, unspecified: Secondary | ICD-10-CM | POA: Diagnosis not present

## 2015-04-12 DIAGNOSIS — I119 Hypertensive heart disease without heart failure: Secondary | ICD-10-CM | POA: Diagnosis not present

## 2015-04-12 DIAGNOSIS — I71019 Dissection of thoracic aorta, unspecified: Secondary | ICD-10-CM

## 2015-04-12 DIAGNOSIS — I7101 Dissection of thoracic aorta: Secondary | ICD-10-CM | POA: Diagnosis not present

## 2015-04-12 LAB — BASIC METABOLIC PANEL
BUN: 15 mg/dL (ref 7–25)
CO2: 31 mmol/L (ref 20–31)
Calcium: 9 mg/dL (ref 8.6–10.4)
Chloride: 102 mmol/L (ref 98–110)
Creat: 1.14 mg/dL — ABNORMAL HIGH (ref 0.50–0.99)
Glucose, Bld: 93 mg/dL (ref 65–99)
Potassium: 3.7 mmol/L (ref 3.5–5.3)
Sodium: 141 mmol/L (ref 135–146)

## 2015-04-12 LAB — CBC
HCT: 40.8 % (ref 36.0–46.0)
Hemoglobin: 13.1 g/dL (ref 12.0–15.0)
MCH: 22.4 pg — ABNORMAL LOW (ref 26.0–34.0)
MCHC: 32.1 g/dL (ref 30.0–36.0)
MCV: 69.9 fL — ABNORMAL LOW (ref 78.0–100.0)
MPV: 10.3 fL (ref 8.6–12.4)
Platelets: 201 10*3/uL (ref 150–400)
RBC: 5.84 MIL/uL — ABNORMAL HIGH (ref 3.87–5.11)
RDW: 16.8 % — ABNORMAL HIGH (ref 11.5–15.5)
WBC: 5.6 10*3/uL (ref 4.0–10.5)

## 2015-04-12 MED ORDER — AMLODIPINE BESYLATE 10 MG PO TABS: 10.0000 mg | ORAL_TABLET | Freq: Every day | ORAL | Status: DC

## 2015-04-12 MED ORDER — LISINOPRIL 40 MG PO TABS: 40.0000 mg | ORAL_TABLET | Freq: Every day | ORAL | Status: DC

## 2015-04-12 MED FILL — ATENOLOL 50 MG TABLET: 50 | 25 days supply | Qty: 25 | Fill #3

## 2015-04-12 MED FILL — FOLIC ACID 1 MG TABLET: 1 | 30 days supply | Qty: 30 | Fill #2

## 2015-04-12 MED FILL — ATORVASTATIN 40 MG TABLET: 40 | 30 days supply | Qty: 30 | Fill #2

## 2015-04-12 MED FILL — AMLODIPINE BESYLATE 10 MG T: 10 | 30 days supply | Qty: 30 | Fill #0

## 2015-04-12 MED FILL — LISINOPRIL 40 MG TABLET: 40 | 30 days supply | Qty: 30 | Fill #0

## 2015-04-12 NOTE — Patient Instructions (Addendum)
We will be checking the following labs today - BMET, CBC   Medication Instructions:    Continue with your current medicines. BUT go by this new list  STOP Hydralazine  I refilled your Lisinopril with the 40 mg tablet a day  Add back Norvasc 10 mg daily  STOP Folic Acid  Get your other medicines refilled today.     Testing/Procedures To Be Arranged:  N/A  Follow-Up:   See me on Tuesday.     Other Special Instructions:   N/A    If you need a refill on your cardiac medications before your next appointment, please call your pharmacy.   Call the [place] office at [phone] if you have any questions, problems or concerns.

## 2015-04-12 NOTE — Progress Notes (Signed)
CARDIOLOGY OFFICE NOTE  Date:  04/12/2015    Alexis Wheeler Date of Birth: January 13, 1955 Medical Record [id number]  PCP:  No PCP Per Patient  Cardiologist:  Radford Pax    Chief Complaint  Patient presents with  . Hypertension  . Hyperlipidemia  . Cardiomyopathy    Follow up visit - seen for Dr. Radford Pax    History of Present Illness: Alexis Wheeler is a 61 y.o. female who presents today for a 1 month check. Seen for Dr. Radford Pax.   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 She underwent replacement of the ascending aortawith resuspension of the AV with 30 mm Hemashield Platinum graft with R axillary artery cannulation by Dr. Servando Snare. EF was 40-45% post op. Follow up echo from 07/2014 showed improvement of her EF - now at 55 to 60%.   Admitted back in December with uncontrolled HTN. Medicines were all changed. She had stopped some.  She was referred back here for management of BP. Did not have PCP.  She was placed her on spironolactone and changed her atenolol to carvedilol.   She was last seen here by Richardson Dopp, PA back in February.   Comes back today. Here alone. Has not had any medicines today. Out of "2 or 3" since yesterday. Fatigued. No chest pain. Breathing ok. She remains fatigued. She did get moved - has her dog with her. Wants to establish with Zettie Pho PA at [facility] [address]. She notes her Hydralazine "makes me sick" - upset stomach and diarrhea after taking "every time".   Past Medical History  Diagnosis Date  . Morbid obesity ([place])   . HEMATURIA UNSPECIFIED   . GERD   . MYOSITIS   . HYPERTENSION   . HYPERLIPIDEMIA   . Vertigo   . Congestive dilated cardiomyopathy ([place]) 08/01/2014    Past Surgical History  Procedure Laterality Date  . Cholecystectomy  2001  . Abdominal hysterectomy  1983  . Replacement ascending aorta N/A 03/03/2014   Procedure: REPAIR OF ASCENDING AORTIC DISSECTION;  Surgeon: Grace Isaac, MD;  Location: [facility];  Service: Open Heart Surgery;  Laterality: N/A;     Medications: Current Outpatient Prescriptions  Medication Sig Dispense Refill  . aspirin EC 81 MG EC tablet Take 1 tablet (81 mg total) by mouth daily.    [name] atorvastatin (LIPITOR) 40 MG tablet Take 1 tablet (40 mg total) by mouth daily at 6 PM. 30 tablet 6  . carvedilol (COREG) 6.25 MG tablet Take 1 tablet (6.25 mg total) by mouth 2 (two) times daily. 60 tablet 11  . lisinopril (PRINIVIL,ZESTRIL) 40 MG tablet Take 1 tablet (40 mg total) by mouth daily. 30 tablet 11  . spironolactone (ALDACTONE) 25 MG tablet Take 1 tablet (25 mg total) by mouth daily. 30 tablet 11  . amLODipine (NORVASC) 10 MG tablet Take 1 tablet (10 mg total) by mouth daily. 30 tablet 11   No current facility-administered medications for this visit.    Allergies: Allergies  Allergen Reactions  . Ibuprofen Other (See Comments)    REACTION: Upset GI (motrin brand causes the side effect)    Social History: The patient  reports that she has never smoked. She does not have any smokeless tobacco history on file. She reports that she does not drink alcohol or use illicit drugs.   Family History:  The patient's family history includes Aortic dissection (age of onset: 72) in her mother; Breast cancer in her sister; Breast cancer (age of onset: 34) in her mother; Hypertension in her daughter, father, mother, and sister; Throat cancer in her brother. There is no history of Seizures.   Review of Systems: Please see the history of present illness.   Otherwise, the review of systems is positive for none.   All other systems are reviewed and negative.   Physical Exam: VS:  BP 200/110 mmHg  Pulse 60  Ht 5\' 3"  (1.6 m)  Wt 181 lb 12.8 oz (82.464 kg)  BMI 32.21 kg/m2 .  BMI Body mass index is 32.21 kg/(m^2).  Wt Readings from Last 3 Encounters:  04/12/15 181 lb 12.8 oz (82.464  kg)  03/08/15 184 lb 1.9 oz (83.516 kg)  02/22/15 183 lb 1.9 oz (83.063 kg)    General: Pleasant. Well developed, well nourished and in no acute distress.  HEENT: Normal. Neck: Supple, no JVD, carotid bruits, or masses noted.  Cardiac: Regular rate and rhythm. No murmurs, rubs, or gallops. No edema.  Respiratory:  Lungs are clear to auscultation bilaterally with normal work of breathing.  GI: Soft and nontender.  MS: No deformity or atrophy. Gait and ROM intact. Skin: Warm and dry. Color is normal.  Neuro:  Strength and sensation are intact and no gross focal deficits noted.  Psych: Alert, appropriate and with normal affect.   LABORATORY DATA:  EKG:  EKG is not ordered today.  Lab Results  Component Value Date   WBC 6.4 03/08/2015   HGB 12.7 03/08/2015   HCT 39.9 03/08/2015   PLT 227 03/08/2015   GLUCOSE 100* 03/08/2015   CHOL 87 03/06/2014   TRIG 108 03/06/2014   HDL 27* 03/06/2014   LDLCALC 38 03/06/2014   ALT 20 03/02/2014   AST 31 03/02/2014   NA 141 03/08/2015   K 4.0 03/08/2015   CL 107 03/08/2015   CREATININE 1.18* 03/08/2015   BUN 20 03/08/2015   CO2 26 03/08/2015   TSH 0.78 03/08/2015   INR 0.96 03/03/2014   HGBA1C 5.8* 03/07/2014    BNP (last 3 results) No results for input(s): BNP in the last 8760 hours.  ProBNP (last 3 results) No results for input(s): PROBNP in the last 8760 hours.   Other Studies Reviewed Today:  Echo Study Conclusions from 07/2014  - Left ventricle: The cavity size was normal. There was mild focal  basal hypertrophy of the septum. Systolic function was normal.  The estimated ejection fraction was in the range of 55% to 60%.  Wall motion was normal; there were no regional wall motion  abnormalities. There was an increased relative contribution of  atrial contraction to ventricular filling. Doppler parameters are  consistent with abnormal left ventricular relaxation (grade 1  diastolic dysfunction). - Aortic valve:  Trileaflet; normal thickness, mildly calcified  leaflets. There was trivial regurgitation. - Aorta: Aortic root dimension: 42 mm (ED). - Aortic root: The aortic root was mildly dilated. - Mitral valve: There was trivial regurgitation.  Assessment/Plan:  1. HTN - uncontrolled. Not tolerating Hydralazine. She is back on her old dose of ACE. Not on Norvasc. I am adding that back today at 10 mg a day. She needs to get her other medicines refilled. I could not stress enough how important it is to take her medicines and not miss any. Needs to be seen in the afternoons as well - after she has had her medicines.  2. HLD - on statin  3. Prior aortic dissection repair - needs good BP control - this was stressed multiple times at the visit today.   4. Fatigue     Current medicines are reviewed with the patient today.  The patient does not have concerns regarding medicines other than what has been noted above.  The following changes have been made:  See above.  Labs/ tests ordered today include:    Orders Placed This Encounter  Procedures  . Basic metabolic panel  . CBC     Disposition:   FU with me on Tuesday.   Patient is agreeable to this plan and will call if any problems develop in the interim.   Signed: Burtis Junes, RN, ANP-C 04/12/2015 8:53 AM  [facility] [address] [place], [place]  [address] Phone: [phone] Fax: [phone]

## 2015-04-16 ENCOUNTER — Telehealth: Payer: Self-pay | Admitting: *Deleted

## 2015-04-16 ENCOUNTER — Encounter: Payer: Self-pay | Admitting: Nurse Practitioner

## 2015-04-16 ENCOUNTER — Ambulatory Visit (INDEPENDENT_AMBULATORY_CARE_PROVIDER_SITE_OTHER): Payer: Medicaid Other | Admitting: Nurse Practitioner

## 2015-04-16 VITALS — BP 160/86 | HR 52 | Ht 63.0 in | Wt 181.8 lb

## 2015-04-16 DIAGNOSIS — Z9889 Other specified postprocedural states: Secondary | ICD-10-CM

## 2015-04-16 DIAGNOSIS — I7101 Dissection of thoracic aorta: Secondary | ICD-10-CM

## 2015-04-16 DIAGNOSIS — I10 Essential (primary) hypertension: Secondary | ICD-10-CM

## 2015-04-16 DIAGNOSIS — R5383 Other fatigue: Secondary | ICD-10-CM | POA: Diagnosis not present

## 2015-04-16 DIAGNOSIS — I71019 Dissection of thoracic aorta, unspecified: Secondary | ICD-10-CM

## 2015-04-16 MED ORDER — SPIRONOLACTONE 50 MG PO TABS: 50.0000 mg | ORAL_TABLET | Freq: Every day | ORAL | Status: DC

## 2015-04-16 MED ORDER — FOLIC ACID 1 MG PO TABS: 1.0000 mg | ORAL_TABLET | Freq: Every day | ORAL | Status: DC

## 2015-04-16 NOTE — Progress Notes (Signed)
CARDIOLOGY OFFICE NOTE  Date:  04/16/2015    Alexis Wheeler Date of Birth: [birth year] Medical Record [id number]  PCP:  No PCP Per Patient  Cardiologist:  Radford Pax    Chief Complaint  Patient presents with  . FU for uncontrolled HTN    4 day check - seen for Dr. Radford Pax    History of Present Illness: Alexis Wheeler is a 60 y.o. female who presents today for a follow up visit. This is a 4 day check. Seen for Dr. Radford Pax.   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 She underwent replacement of the ascending aorta with resuspension of the AV with 30 mm Hemashield Platinum graft with R axillary artery cannulation by Dr. Servando Snare. EF was 40-45% post op. Follow up echo from 07/2014 showed improvement of her EF - now at 55 to 60%.   Admitted back in December with uncontrolled HTN. Medicines were all changed. She had stopped some.  She was referred back here for management of BP. Did not have PCP. She was placed her on spironolactone and changed her atenolol to carvedilol.   She was last seen here by Richardson Dopp, PA back in February.  I saw her last week - was out of her medicines - BP sky high. Not tolerating her Hydralazine. Had not taken the medicines that she did have due to the early appointment time.  Comes back today. Here alone. She says she is doing ok but continues to complain of this fatigue - notes it "just comes over me". Sounds exertional. Will last for an hour or so and then just go away. Very random. Feels like she had just started having this symptom right before her aortic dissection. No real chest pain. She is trying to stay active. Does not feel dizzy or like she will pass out. Other than this fatigue - she feels better. She is glad she is not on Hydralazine anymore.  Past Medical History  Diagnosis Date  . Morbid obesity ([place])   . HEMATURIA  UNSPECIFIED   . GERD   . MYOSITIS   . HYPERTENSION   . HYPERLIPIDEMIA   . Vertigo   . Congestive dilated cardiomyopathy ([place]) 08/01/2014    Past Surgical History  Procedure Laterality Date  . Cholecystectomy  2001  . Abdominal hysterectomy  1983  . Replacement ascending aorta N/A 03/03/2014    Procedure: REPAIR OF ASCENDING AORTIC DISSECTION;  Surgeon: Grace Isaac, MD;  Location: [facility];  Service: Open Heart Surgery;  Laterality: N/A;     Medications: Current Outpatient Prescriptions  Medication Sig Dispense Refill  . amLODipine (NORVASC) 10 MG tablet Take 1 tablet (10 mg total) by mouth daily. 30 tablet 11  . aspirin EC 81 MG EC tablet Take 1 tablet (81 mg total) by mouth daily.    [name] atorvastatin (LIPITOR) 40 MG tablet Take 1 tablet (40 mg total) by mouth daily at 6 PM. 30 tablet 6  . carvedilol (COREG) 6.25 MG tablet Take 1 tablet (6.25 mg total) by mouth 2 (two) times daily. 60 tablet 11  . lisinopril (PRINIVIL,ZESTRIL) 40 MG tablet Take 1 tablet (40 mg total) by mouth daily. 30 tablet 11  . folic acid (FOLVITE) 1 MG tablet Take 1 tablet (1 mg total) by mouth daily.    [name] spironolactone (ALDACTONE) 50 MG tablet Take 1  tablet (50 mg total) by mouth daily. 30 tablet 6   No current facility-administered medications for this visit.    Allergies: Allergies  Allergen Reactions  . Hydralazine Nausea Only  . Ibuprofen Other (See Comments)    REACTION: Upset GI (motrin brand causes the side effect)    Social History: The patient  reports that she has never smoked. She does not have any smokeless tobacco history on file. She reports that she does not drink alcohol or use illicit drugs.   Family History: The patient's family history includes Aortic dissection (age of onset: 54) in her mother; Breast cancer in her sister; Breast cancer (age of onset: 60) in her mother; Hypertension in her daughter, father, mother, and sister; Throat cancer in her brother. There is no history of  Seizures.   Review of Systems: Please see the history of present illness.   Otherwise, the review of systems is positive for none.   All other systems are reviewed and negative.   Physical Exam: VS:  BP 160/86 mmHg  Pulse 52  Ht 5\' 3"  (1.6 m)  Wt 181 lb 12.8 oz (82.464 kg)  BMI 32.21 kg/m2 .  BMI Body mass index is 32.21 kg/(m^2).  Wt Readings from Last 3 Encounters:  04/16/15 181 lb 12.8 oz (82.464 kg)  04/12/15 181 lb 12.8 oz (82.464 kg)  03/08/15 184 lb 1.9 oz (83.516 kg)   BP is 150/88 by me.   General: Pleasant. She is quite jovial today. She is alert and in no acute distress.  HEENT: Normal. Neck: Supple, no JVD, carotid bruits, or masses noted.  Cardiac: Regular rate and rhythm. No murmurs, rubs, or gallops. No edema.  Respiratory:  Lungs are clear to auscultation bilaterally with normal work of breathing.  GI: Soft and nontender.  MS: No deformity or atrophy. Gait and ROM intact. Skin: Warm and dry. Color is normal.  Neuro:  Strength and sensation are intact and no gross focal deficits noted.  Psych: Alert, appropriate and with normal affect.   LABORATORY DATA:  EKG:  EKG is not ordered today.  Lab Results  Component Value Date   WBC 5.6 04/12/2015   HGB 13.1 04/12/2015   HCT 40.8 04/12/2015   PLT 201 04/12/2015   GLUCOSE 93 04/12/2015   CHOL 87 03/06/2014   TRIG 108 03/06/2014   HDL 27* 03/06/2014   LDLCALC 38 03/06/2014   ALT 20 03/02/2014   AST 31 03/02/2014   NA 141 04/12/2015   K 3.7 04/12/2015   CL 102 04/12/2015   CREATININE 1.14* 04/12/2015   BUN 15 04/12/2015   CO2 31 04/12/2015   TSH 0.78 03/08/2015   INR 0.96 03/03/2014   HGBA1C 5.8* 03/07/2014    BNP (last 3 results) No results for input(s): BNP in the last 8760 hours.  ProBNP (last 3 results) No results for input(s): PROBNP in the last 8760 hours.   Other Studies Reviewed Today:  Echo Study Conclusions from 07/2014  - Left ventricle: The cavity size was normal. There was mild  focal  basal hypertrophy of the septum. Systolic function was normal.  The estimated ejection fraction was in the range of 55% to 60%.  Wall motion was normal; there were no regional wall motion  abnormalities. There was an increased relative contribution of  atrial contraction to ventricular filling. Doppler parameters are  consistent with abnormal left ventricular relaxation (grade 1  diastolic dysfunction). - Aortic valve: Trileaflet; normal thickness, mildly calcified  leaflets. There was  trivial regurgitation. - Aorta: Aortic root dimension: 42 mm (ED). - Aortic root: The aortic root was mildly dilated. - Mitral valve: There was trivial regurgitation.  Assessment/Plan:  1. HTN - have added back Norvasc at last visit - BP has improved. Will increase the aldactone to 50 mg a day. BMET in one week.    2. HLD - on statin  3. Prior aortic dissection repair - needs good BP control - this was stressed again at today's visit.  4. Fatigue - most likely multifactorial but I think we need to be worried about possible CAD - especially with exertional symptoms - will arrange for Lexiscan myoview - 2 day study.   5. Iron deficiency anemia - would restart her Folic acid.   6. Prior dilated CM - last echo showed EF had recovered.   Current medicines are reviewed with the patient today.  The patient does not have concerns regarding medicines other than what has been noted above.  The following changes have been made:  See above.  Labs/ tests ordered today include:    Orders Placed This Encounter  Procedures  . Basic metabolic panel  . Myocardial Perfusion Imaging     Disposition:   FU with me about one week after her Myoview.   Patient is agreeable to this plan and will call if any problems develop in the interim.   Signed: Burtis Junes, RN, ANP-C 04/16/2015 2:30 PM  [facility] [address] [place], [place]  [address] Phone:  [phone] Fax: [phone]

## 2015-04-16 NOTE — Patient Instructions (Addendum)
We will be checking the following labs today - NONE  BMET in one week   Medication Instructions:    Continue with your current medicines. BUT  Restart your Folic Acid   Increase the spironolactone to 50 mg a day - you may take 2 of your 25 mg tablets to use [place] - the RX for the 50 mg is at the drug store.   Testing/Procedures To Be Arranged:  Lexiscan Myoview - lets make sure this fatigue is not coming from your heart  Follow-Up:   See me a week after the Myoview.     Other Special Instructions:   N/A    If you need a refill on your cardiac medications before your next appointment, please call your pharmacy.   Call the [place] office at [phone] if you have any questions, problems or concerns.

## 2015-04-16 NOTE — Progress Notes (Deleted)
CARDIOLOGY OFFICE NOTE  Date:  04/16/2015    Alexis Wheeler Date of Birth: [birth year] Medical Record [id number]  PCP:  No PCP Per Patient  Cardiologist:  ***    No chief complaint on file.   History of Present Illness: Alexis Wheeler is a 60 y.o. female who presents today for a ***   Past Medical History  Diagnosis Date  . Morbid obesity ([place])   . HEMATURIA UNSPECIFIED   . GERD   . MYOSITIS   . HYPERTENSION   . HYPERLIPIDEMIA   . Vertigo   . Congestive dilated cardiomyopathy ([place]) 08/01/2014    Past Surgical History  Procedure Laterality Date  . Cholecystectomy  2001  . Abdominal hysterectomy  1983  . Replacement ascending aorta N/A 03/03/2014    Procedure: REPAIR OF ASCENDING AORTIC DISSECTION;  Surgeon: Grace Isaac, MD;  Location: [facility];  Service: Open Heart Surgery;  Laterality: N/A;     Medications: Current Outpatient Prescriptions  Medication Sig Dispense Refill  . amLODipine (NORVASC) 10 MG tablet Take 1 tablet (10 mg total) by mouth daily. 30 tablet 11  . aspirin EC 81 MG EC tablet Take 1 tablet (81 mg total) by mouth daily.    [name] atorvastatin (LIPITOR) 40 MG tablet Take 1 tablet (40 mg total) by mouth daily at 6 PM. 30 tablet 6  . carvedilol (COREG) 6.25 MG tablet Take 1 tablet (6.25 mg total) by mouth 2 (two) times daily. 60 tablet 11  . lisinopril (PRINIVIL,ZESTRIL) 40 MG tablet Take 1 tablet (40 mg total) by mouth daily. 30 tablet 11  . spironolactone (ALDACTONE) 25 MG tablet Take 1 tablet (25 mg total) by mouth daily. 30 tablet 11   No current facility-administered medications for this visit.    Allergies: Allergies  Allergen Reactions  . Ibuprofen Other (See Comments)    REACTION: Upset GI (motrin brand causes the side effect)    Social History: The patient  reports that she has never smoked. She does not have any smokeless tobacco history on file. She reports that she does not drink alcohol or use illicit drugs.   Family  History: The patient's ***family history includes Aortic dissection (age of onset: 40) in her mother; Breast cancer in her sister; Breast cancer (age of onset: 22) in her mother; Hypertension in her daughter, father, mother, and sister; Throat cancer in her brother. There is no history of Seizures.   Review of Systems: Please see the history of present illness.   Otherwise, the review of systems is positive for {NONE DEFAULTED:18576::"none"}.   All other systems are reviewed and negative.   Physical Exam: VS:  There were no vitals taken for this visit. [name]  BMI There is no weight on file to calculate BMI.  Wt Readings from Last 3 Encounters:  04/12/15 181 lb 12.8 oz (82.464 kg)  03/08/15 184 lb 1.9 oz (83.516 kg)  02/22/15 183 lb 1.9 oz (83.063 kg)    General: Pleasant. Well developed, well nourished and in no acute distress.  HEENT: Normal. Neck: Supple, no JVD, carotid bruits, or masses noted.  Cardiac: ***Regular rate and rhythm. No murmurs, rubs, or gallops. No edema.  Respiratory:  Lungs are clear to auscultation bilaterally with normal work of breathing.  GI: Soft and nontender.  MS: No deformity or atrophy. Gait and ROM intact. Skin: Warm and dry. Color is normal.  Neuro:  Strength and sensation are intact and no gross focal deficits noted.  Psych:  Alert, appropriate and with normal affect.   LABORATORY DATA:  EKG:  EKG {ACTION; IS/IS [id number] ordered today. This demonstrates ***.  Lab Results  Component Value Date   WBC 5.6 04/12/2015   HGB 13.1 04/12/2015   HCT 40.8 04/12/2015   PLT 201 04/12/2015   GLUCOSE 93 04/12/2015   CHOL 87 03/06/2014   TRIG 108 03/06/2014   HDL 27* 03/06/2014   LDLCALC 38 03/06/2014   ALT 20 03/02/2014   AST 31 03/02/2014   NA 141 04/12/2015   K 3.7 04/12/2015   CL 102 04/12/2015   CREATININE 1.14* 04/12/2015   BUN 15 04/12/2015   CO2 31 04/12/2015   TSH 0.78 03/08/2015   INR 0.96 03/03/2014   HGBA1C 5.8* 03/07/2014    BNP  (last 3 results) No results for input(s): BNP in the last 8760 hours.  ProBNP (last 3 results) No results for input(s): PROBNP in the last 8760 hours.   Other Studies Reviewed Today:   Assessment/Plan:   Current medicines are reviewed with the patient today.  The patient does not have concerns regarding medicines other than what has been noted above.  The following changes have been made:  See above.  Labs/ tests ordered today include:   No orders of the defined types were placed in this encounter.     Disposition:   FU with *** in {gen number [id number] {Days to years:10300}.   Patient is agreeable to this plan and will call if any problems develop in the interim.   Signed: Burtis Junes, RN, ANP-C 04/16/2015 2:03 PM  [facility] [address] [place], [place]  [address] Phone: [phone] Fax: [phone]

## 2015-04-16 NOTE — Telephone Encounter (Signed)
lvm on pt's machine I was advised by nuclear department that I needed to change pt's time on two day stress test, Monday, April 10 @ 2:00pm and Tuesday, April 11 2 7:30 am.

## 2015-04-17 ENCOUNTER — Telehealth (HOSPITAL_COMMUNITY): Payer: Self-pay | Admitting: *Deleted

## 2015-04-17 NOTE — Telephone Encounter (Signed)
Left message on voicemail per DPR in reference to upcoming appointment scheduled on 04/22/15 with detailed instructions given per Myocardial Perfusion Study Information Sheet for the test. LM to arrive 15 minutes early, and that it is imperative to arrive on time for appointment to keep from having the test rescheduled. If you need to cancel or reschedule your appointment, please call the office within 24 hours of your appointment. Failure to do so may result in a cancellation of your appointment, and a $50 no show fee. Phone number given for call back for any questions. Hubbard Robinson, RN

## 2015-04-17 NOTE — Telephone Encounter (Signed)
Crissie Figures, RN, from nuclear department stated would take care of this pt when pt came in Monday, April 10 for stress test.

## 2015-04-22 ENCOUNTER — Other Ambulatory Visit (INDEPENDENT_AMBULATORY_CARE_PROVIDER_SITE_OTHER): Payer: Medicaid Other | Admitting: *Deleted

## 2015-04-22 ENCOUNTER — Ambulatory Visit (HOSPITAL_COMMUNITY): Payer: Medicaid Other | Attending: Cardiovascular Disease

## 2015-04-22 VITALS — Ht 63.0 in | Wt 181.0 lb

## 2015-04-22 DIAGNOSIS — R0602 Shortness of breath: Secondary | ICD-10-CM

## 2015-04-22 DIAGNOSIS — I10 Essential (primary) hypertension: Secondary | ICD-10-CM | POA: Diagnosis not present

## 2015-04-22 DIAGNOSIS — R5383 Other fatigue: Secondary | ICD-10-CM

## 2015-04-22 DIAGNOSIS — I119 Hypertensive heart disease without heart failure: Secondary | ICD-10-CM | POA: Insufficient documentation

## 2015-04-22 DIAGNOSIS — Z9889 Other specified postprocedural states: Secondary | ICD-10-CM | POA: Diagnosis not present

## 2015-04-22 DIAGNOSIS — I7101 Dissection of thoracic aorta: Secondary | ICD-10-CM | POA: Insufficient documentation

## 2015-04-22 DIAGNOSIS — R9439 Abnormal result of other cardiovascular function study: Secondary | ICD-10-CM | POA: Insufficient documentation

## 2015-04-22 DIAGNOSIS — I71019 Dissection of thoracic aorta, unspecified: Secondary | ICD-10-CM

## 2015-04-22 LAB — BASIC METABOLIC PANEL
BUN: 14 mg/dL (ref 7–25)
CO2: 23 mmol/L (ref 20–31)
Calcium: 9.4 mg/dL (ref 8.6–10.4)
Chloride: 104 mmol/L (ref 98–110)
Creat: 0.85 mg/dL (ref 0.50–0.99)
Glucose, Bld: 94 mg/dL (ref 65–99)
Potassium: 5.5 mmol/L — ABNORMAL HIGH (ref 3.5–5.3)
Sodium: 137 mmol/L (ref 135–146)

## 2015-04-22 MED ORDER — REGADENOSON 0.4 MG/5ML IV SOLN
0.4000 mg | Freq: Once | INTRAVENOUS | Status: AC
  Administered 2015-04-22: 0.4 mg via INTRAVENOUS

## 2015-04-22 MED ORDER — TECHNETIUM TC 99M SESTAMIBI GENERIC - CARDIOLITE
32.7000 | Freq: Once | INTRAVENOUS | Status: AC | PRN
  Administered 2015-04-22: 32.7 via INTRAVENOUS

## 2015-04-22 MED ORDER — AMINOPHYLLINE 25 MG/ML IV SOLN
75.0000 mg | Freq: Once | INTRAVENOUS | Status: AC
  Administered 2015-04-22: 75 mg via INTRAVENOUS

## 2015-04-23 ENCOUNTER — Other Ambulatory Visit: Payer: Self-pay | Admitting: *Deleted

## 2015-04-23 ENCOUNTER — Other Ambulatory Visit (INDEPENDENT_AMBULATORY_CARE_PROVIDER_SITE_OTHER): Payer: Medicaid Other

## 2015-04-23 ENCOUNTER — Ambulatory Visit (HOSPITAL_COMMUNITY): Payer: Medicaid Other | Attending: Cardiology

## 2015-04-23 DIAGNOSIS — E875 Hyperkalemia: Secondary | ICD-10-CM

## 2015-04-23 DIAGNOSIS — E876 Hypokalemia: Secondary | ICD-10-CM | POA: Diagnosis not present

## 2015-04-23 LAB — MYOCARDIAL PERFUSION IMAGING
LV dias vol: 110 mL (ref 46–106)
LV sys vol: 64 mL
Peak HR: 58 {beats}/min
RATE: 0.45
Rest HR: 39 {beats}/min
SDS: 9
SRS: 5
SSS: 14
TID: 1.04

## 2015-04-23 LAB — BASIC METABOLIC PANEL
BUN: 13 mg/dL (ref 7–25)
CO2: 28 mmol/L (ref 20–31)
Calcium: 8.7 mg/dL (ref 8.6–10.4)
Chloride: 104 mmol/L (ref 98–110)
Creat: 1.01 mg/dL — ABNORMAL HIGH (ref 0.50–0.99)
Glucose, Bld: 81 mg/dL (ref 65–99)
Potassium: 4 mmol/L (ref 3.5–5.3)
Sodium: 140 mmol/L (ref 135–146)

## 2015-04-23 MED ORDER — TECHNETIUM TC 99M SESTAMIBI GENERIC - CARDIOLITE
32.5000 | Freq: Once | INTRAVENOUS | Status: AC | PRN
  Administered 2015-04-23: 33 via INTRAVENOUS

## 2015-04-23 NOTE — Addendum Note (Signed)
Addended by: Velna Ochs on: 04/23/2015 02:24 PM   Modules accepted: Orders

## 2015-04-24 ENCOUNTER — Telehealth: Payer: Self-pay | Admitting: Nurse Practitioner

## 2015-04-24 NOTE — Telephone Encounter (Signed)
New Message:    Pt wants to know if her stress test results are back from yesterday?

## 2015-04-26 ENCOUNTER — Ambulatory Visit (INDEPENDENT_AMBULATORY_CARE_PROVIDER_SITE_OTHER): Payer: Medicaid Other | Admitting: Nurse Practitioner

## 2015-04-26 ENCOUNTER — Encounter: Payer: Self-pay | Admitting: Nurse Practitioner

## 2015-04-26 ENCOUNTER — Other Ambulatory Visit: Payer: Self-pay | Admitting: Nurse Practitioner

## 2015-04-26 VITALS — BP 130/62 | HR 56 | Ht 64.0 in | Wt 180.1 lb

## 2015-04-26 DIAGNOSIS — Z01810 Encounter for preprocedural cardiovascular examination: Secondary | ICD-10-CM

## 2015-04-26 DIAGNOSIS — R9439 Abnormal result of other cardiovascular function study: Secondary | ICD-10-CM

## 2015-04-26 LAB — BASIC METABOLIC PANEL
BUN: 18 mg/dL (ref 7–25)
CO2: 25 mmol/L (ref 20–31)
Calcium: 9.3 mg/dL (ref 8.6–10.4)
Chloride: 105 mmol/L (ref 98–110)
Creat: 1.01 mg/dL — ABNORMAL HIGH (ref 0.50–0.99)
Glucose, Bld: 104 mg/dL — ABNORMAL HIGH (ref 65–99)
Potassium: 4.1 mmol/L (ref 3.5–5.3)
Sodium: 138 mmol/L (ref 135–146)

## 2015-04-26 LAB — PROTIME-INR
INR: 0.94 (ref <1.50)
Prothrombin Time: 12.7 seconds (ref 11.6–15.2)

## 2015-04-26 LAB — APTT: aPTT: 31 seconds (ref 24–37)

## 2015-04-26 LAB — CBC
HCT: 39.7 % (ref 35.0–45.0)
Hemoglobin: 12.8 g/dL (ref 11.7–15.5)
MCH: 22.7 pg — ABNORMAL LOW (ref 27.0–33.0)
MCHC: 32.2 g/dL (ref 32.0–36.0)
MCV: 70.4 fL — ABNORMAL LOW (ref 80.0–100.0)
MPV: 10.1 fL (ref 7.5–12.5)
Platelets: 197 10*3/uL (ref 140–400)
RBC: 5.64 MIL/uL — ABNORMAL HIGH (ref 3.80–5.10)
RDW: 16.7 % — ABNORMAL HIGH (ref 11.0–15.0)
WBC: 8.3 10*3/uL (ref 3.8–10.8)

## 2015-04-26 NOTE — Progress Notes (Signed)
CARDIOLOGY OFFICE NOTE  Date:  04/26/2015    Alexis Wheeler Date of Birth: 1955/02/11 Medical Record [id number]  PCP:  No PCP Per Patient  Cardiologist:  Radford Pax    Chief Complaint  Patient presents with  . FU after Myoview.    Seen for Dr. Radford Pax.    History of Present Illness: Alexis Wheeler is a 60 y.o. female who presents today for a follow up visit after her Myoview.   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 She underwent replacement of the ascending aorta with resuspension of the AV with 30 mm Hemashield Platinum graft with R axillary artery cannulation by Dr. Servando Snare. EF was 40-45% post op. Follow up echo from 07/2014 showed improvement of her EF - now at 55 to 60%.   Admitted back in December with uncontrolled HTN. Medicines were all changed. She had stopped some.  She was referred back here for management of BP. Did not have PCP. She was placed on spironolactone and changed her atenolol to carvedilol.   I have seen her back several times over the past few weeks - BP still too high. Did not tolerate Hydralazine. Complained of what sounded like exertional fatigue - she was not cathed prior to her AAA dissection and I was worried about CAD. Myoview was obtained - see below.   Comes back today. Here alone. Having some pain in her left side - has had kidney stones in the past. Using some Tramadol that she has on hand. No active chest pain but does continue to have this exertional fatigue - getting worse. This just started right before she had her dissection last year. BP looks much better.   Past Medical History  Diagnosis Date  . Morbid obesity ([place])   . HEMATURIA UNSPECIFIED   . GERD   . MYOSITIS   . HYPERTENSION   . HYPERLIPIDEMIA   . Vertigo   . Congestive dilated cardiomyopathy ([place]) 08/01/2014    Past Surgical History  Procedure  Laterality Date  . Cholecystectomy  2001  . Abdominal hysterectomy  1983  . Replacement ascending aorta N/A 03/03/2014    Procedure: REPAIR OF ASCENDING AORTIC DISSECTION;  Surgeon: Grace Isaac, MD;  Location: [facility];  Service: Open Heart Surgery;  Laterality: N/A;     Medications: Current Outpatient Prescriptions  Medication Sig Dispense Refill  . amLODipine (NORVASC) 10 MG tablet Take 1 tablet (10 mg total) by mouth daily. 30 tablet 11  . aspirin EC 81 MG EC tablet Take 1 tablet (81 mg total) by mouth daily.    [name] atorvastatin (LIPITOR) 40 MG tablet Take 1 tablet (40 mg total) by mouth daily at 6 PM. 30 tablet 6  . carvedilol (COREG) 6.25 MG tablet Take 1 tablet (6.25 mg total) by mouth 2 (two) times daily. 60 tablet 11  . folic acid (FOLVITE) 1 MG tablet Take 1 tablet (1 mg total) by mouth daily.    [name] lisinopril (PRINIVIL,ZESTRIL) 40 MG tablet Take 1 tablet (40 mg total) by mouth daily. 30 tablet 11  . spironolactone (ALDACTONE) 50 MG tablet Take 1 tablet (50 mg total) by mouth daily. 30 tablet 6   No current facility-administered medications for this visit.    Allergies: Allergies  Allergen Reactions  . Hydralazine Nausea Only  . Ibuprofen Other (See Comments)  REACTION: Upset GI (motrin brand causes the side effect)    Social History: The patient  reports that she has never smoked. She does not have any smokeless tobacco history on file. She reports that she does not drink alcohol or use illicit drugs.   Family History: The patient's family history includes Aortic dissection (age of onset: 64) in her mother; Breast cancer in her sister; Breast cancer (age of onset: 71) in her mother; Hypertension in her daughter, father, mother, and sister; Throat cancer in her brother. There is no history of Seizures.   Review of Systems: Please see the history of present illness.   Otherwise, the review of systems is positive for none.   All other systems are reviewed and negative.    Physical Exam: VS:  BP 130/62 mmHg  Pulse 56  Ht 5\' 4"  (1.626 m)  Wt 180 lb 1.9 oz (81.702 kg)  BMI 30.90 kg/m2 .  BMI Body mass index is 30.9 kg/(m^2).  Wt Readings from Last 3 Encounters:  04/26/15 180 lb 1.9 oz (81.702 kg)  04/22/15 181 lb (82.101 kg)  04/16/15 181 lb 12.8 oz (82.464 kg)    General: Pleasant obese black female who is alert and in no acute distress.  HEENT: Normal. Neck: Supple, no JVD, carotid bruits, or masses noted.  Cardiac: Regular rate and rhythm. No murmurs, rubs, or gallops. No edema.  Respiratory:  Lungs are clear to auscultation bilaterally with normal work of breathing.  GI: Soft and nontender.  MS: No deformity or atrophy. Gait and ROM intact. Skin: Warm and dry. Color is normal.  Neuro:  Strength and sensation are intact and no gross focal deficits noted.  Psych: Alert, appropriate and with normal affect.   LABORATORY DATA:  EKG:  EKG is not ordered today.  Lab Results  Component Value Date   WBC 5.6 04/12/2015   HGB 13.1 04/12/2015   HCT 40.8 04/12/2015   PLT 201 04/12/2015   GLUCOSE 81 04/23/2015   CHOL 87 03/06/2014   TRIG 108 03/06/2014   HDL 27* 03/06/2014   LDLCALC 38 03/06/2014   ALT 20 03/02/2014   AST 31 03/02/2014   NA 140 04/23/2015   K 4.0 04/23/2015   CL 104 04/23/2015   CREATININE 1.01* 04/23/2015   BUN 13 04/23/2015   CO2 28 04/23/2015   TSH 0.78 03/08/2015   INR 0.96 03/03/2014   HGBA1C 5.8* 03/07/2014    BNP (last 3 results) No results for input(s): BNP in the last 8760 hours.  ProBNP (last 3 results) No results for input(s): PROBNP in the last 8760 hours.   Other Studies Reviewed Today:  Myoview Study Highlights 04/2015    The left ventricular ejection fraction is moderately decreased (30-44%).  Nuclear stress EF: 42%.  Defect 1: There is a large defect of moderate severity.  Findings consistent with ischemia.  No T wave inversion was noted during stress.  There was no ST segment deviation  noted during stress.  Large size, moderate severity reversible (SDS 9) inferolateral perfusion defect suggestive of ischemia. LVEF 42% with inferolateral hypokinesis. This is a high risk study based on the extent of ischemia.   Echo Study Conclusions from 07/2014  - Left ventricle: The cavity size was normal. There was mild focal  basal hypertrophy of the septum. Systolic function was normal.  The estimated ejection fraction was in the range of 55% to 60%.  Wall motion was normal; there were no regional wall motion  abnormalities. There was  an increased relative contribution of  atrial contraction to ventricular filling. Doppler parameters are  consistent with abnormal left ventricular relaxation (grade 1  diastolic dysfunction). - Aortic valve: Trileaflet; normal thickness, mildly calcified  leaflets. There was trivial regurgitation. - Aorta: Aortic root dimension: 42 mm (ED). - Aortic root: The aortic root was mildly dilated. - Mitral valve: There was trivial regurgitation.  Assessment/Plan:  1. HTN - aldactone was increased at last visit - BP much better on her current regimen.   2. HLD - on statin  3. Prior aortic dissection repair - needs good BP control - this was stressed again at today's visit.  4. Exertional Fatigue - now with high risk Myoview - need to proceed on with cardiac catheterization - The patient understands that risks include but are not limited to stroke (1 in 1000), death (1 in 1000), kidney failure [usually temporary] (1 in 500), bleeding (1 in 200), allergic reaction [possibly serious] (1 in 200), and agrees to proceed. Arranged for next week with Dr. Martinique.   5. Iron deficiency anemia - told to restart Folic acid at last visit  6. Prior dilated CM - last echo showed EF had recovered but is lower by recent Myoview.  Current medicines are reviewed with the patient today.  The patient does not have concerns regarding medicines other than what has  been noted above.  The following changes have been made:  See above.  Labs/ tests ordered today include:    Orders Placed This Encounter  Procedures  . Basic metabolic panel  . CBC  . Protime-INR  . APTT     Disposition:   I will see back in one month. Cardiac cath is arranged for next week with Dr. Martinique.   Patient is agreeable to this plan and will call if any problems develop in the interim.   Signed: Burtis Junes, RN, ANP-C 04/26/2015 3:18 PM  [facility] [address] [place], [place]  [address] Phone: [phone] Fax: [phone]

## 2015-04-26 NOTE — Patient Instructions (Addendum)
We will be checking the following labs today - BMET, CBC, PT, PTT   Medication Instructions:    Continue with your current medicines.     Testing/Procedures To Be Arranged:  Cardiac catheterization   See me in one month  Other Special Instructions:  Your provider has recommended a cardiac catherization  You are scheduled for a cardiac catheterization on Tuesday, April 18th at 7:30 AM with Dr. Martinique or associate.  Go to [facility] 2nd Floor Short Stay on Tuesday, April 18th at 5:30Am.  Enter thru the [employer] entrance A No food or drink after midnight on Monday. You may take your medications with a sip of water on the day of your procedure.   Coronary Angiogram A coronary angiogram, also called coronary angiography, is an X-ray procedure used to look at the arteries in the heart. In this procedure, a dye (contrast dye) is injected through a long, hollow tube (catheter). The catheter is about the size of a piece of cooked spaghetti and is inserted through your groin, wrist, or arm. The dye is injected into each artery, and X-rays are then taken to show if there is a blockage in the arteries of your heart.  LET [facility] CARE PROVIDER KNOW ABOUT: Any allergies you have, including allergies to shellfish or contrast dye.  All medicines you are taking, including vitamins, herbs, eye drops, creams, and over-the-counter medicines.  Previous problems you or members of your family have had with the use of anesthetics.  Any blood disorders you have.  Previous surgeries you have had. History of kidney problems or failure.  Other medical conditions you have.  RISKS AND COMPLICATIONS  Generally, a coronary angiogram is a safe procedure. However, about 1 person out of 1000 can have problems that may include: Allergic reaction to the dye. Bleeding/bruising from the access site or other locations. Kidney injury, especially in people with impaired kidney function. Stroke  (rare). Heart attack (rare). Irregular rhythms (rare) Death (rare)  BEFORE THE PROCEDURE  Do not eat or drink anything after midnight the night before the procedure or as directed by your health care provider.  Ask your health care provider about changing or stopping your regular medicines. This is especially important if you are taking diabetes medicines or blood thinners.  PROCEDURE You may be given a medicine to help you relax (sedative) before the procedure. This medicine is given through an intravenous (IV) access tube that is inserted into one of your veins.  The area where the catheter will be inserted will be washed and shaved. This is usually done in the groin but may be done in the fold of your arm (near your elbow) or in the wrist.  A medicine will be given to numb the area where the catheter will be inserted (local anesthetic).  The health care provider will insert the catheter into an artery. The catheter will be guided by using a special type of X-ray (fluoroscopy) of the blood vessel being examined.  A special dye will then be injected into the catheter, and X-rays will be taken. The dye will help to show where any narrowing or blockages are located in the heart arteries.    AFTER THE PROCEDURE  If the procedure is done through the leg, you will be kept in bed lying flat for several hours. You will be instructed to not bend or cross your legs. The insertion site will be checked frequently.  The pulse in your feet or wrist will be  checked frequently.  Additional blood tests, X-rays, and an electrocardiogram may be done.      If you need a refill on your cardiac medications before your next appointment, please call your pharmacy.   Call the [place] office at [phone] if you have any questions, problems or concerns.

## 2015-04-30 ENCOUNTER — Encounter (HOSPITAL_COMMUNITY): Payer: Self-pay | Admitting: Cardiology

## 2015-04-30 ENCOUNTER — Encounter (HOSPITAL_COMMUNITY)
Admission: RE | Disposition: A | Discharge status: Home / Self Care | Payer: Self-pay | Source: Ambulatory Visit | Attending: Cardiology

## 2015-04-30 ENCOUNTER — Ambulatory Visit (HOSPITAL_COMMUNITY)
Admission: RE | Admit: 2015-04-30 | Discharge: 2015-04-30 | Disposition: A | Discharge status: Home / Self Care | Payer: Medicaid Other | Source: Ambulatory Visit | Attending: Cardiology | Admitting: Cardiology

## 2015-04-30 DIAGNOSIS — Z79899 Other long term (current) drug therapy: Secondary | ICD-10-CM | POA: Diagnosis not present

## 2015-04-30 DIAGNOSIS — Z7982 Long term (current) use of aspirin: Secondary | ICD-10-CM | POA: Insufficient documentation

## 2015-04-30 DIAGNOSIS — D509 Iron deficiency anemia, unspecified: Secondary | ICD-10-CM | POA: Diagnosis not present

## 2015-04-30 DIAGNOSIS — I420 Dilated cardiomyopathy: Secondary | ICD-10-CM | POA: Diagnosis not present

## 2015-04-30 DIAGNOSIS — R5383 Other fatigue: Secondary | ICD-10-CM | POA: Diagnosis not present

## 2015-04-30 DIAGNOSIS — Z6831 Body mass index (BMI) 31.0-31.9, adult: Secondary | ICD-10-CM | POA: Diagnosis not present

## 2015-04-30 DIAGNOSIS — E785 Hyperlipidemia, unspecified: Secondary | ICD-10-CM | POA: Insufficient documentation

## 2015-04-30 DIAGNOSIS — I2510 Atherosclerotic heart disease of native coronary artery without angina pectoris: Secondary | ICD-10-CM | POA: Insufficient documentation

## 2015-04-30 DIAGNOSIS — I119 Hypertensive heart disease without heart failure: Secondary | ICD-10-CM | POA: Diagnosis present

## 2015-04-30 DIAGNOSIS — I110 Hypertensive heart disease with heart failure: Secondary | ICD-10-CM | POA: Diagnosis not present

## 2015-04-30 DIAGNOSIS — R9439 Abnormal result of other cardiovascular function study: Secondary | ICD-10-CM | POA: Insufficient documentation

## 2015-04-30 DIAGNOSIS — Z9889 Other specified postprocedural states: Secondary | ICD-10-CM

## 2015-04-30 HISTORY — PX: CARDIAC CATHETERIZATION: SHX172

## 2015-04-30 SURGERY — LEFT HEART CATH AND CORONARY ANGIOGRAPHY
Anesthesia: LOCAL

## 2015-04-30 MED ORDER — ASPIRIN 81 MG PO CHEW
CHEWABLE_TABLET | ORAL | Status: AC
  Administered 2015-04-30: 81 mg via ORAL
  Filled 2015-04-30: qty 1

## 2015-04-30 MED ORDER — SODIUM CHLORIDE 0.9 % WEIGHT BASED INFUSION: 3.0000 mL/kg/h | INTRAVENOUS | Status: DC

## 2015-04-30 MED ORDER — LIDOCAINE HCL (PF) 1 % IJ SOLN
INTRAMUSCULAR | Status: AC
  Filled 2015-04-30: qty 30

## 2015-04-30 MED ORDER — DIAZEPAM 5 MG PO TABS: 10.0000 mg | ORAL_TABLET | ORAL | Status: DC

## 2015-04-30 MED ORDER — HEPARIN SODIUM (PORCINE) 1000 UNIT/ML IJ SOLN
INTRAMUSCULAR | Status: DC | PRN
  Administered 2015-04-30: 4000 [IU] via INTRAVENOUS

## 2015-04-30 MED ORDER — IOPAMIDOL (ISOVUE-370) INJECTION 76%
INTRAVENOUS | Status: AC
  Filled 2015-04-30: qty 100

## 2015-04-30 MED ORDER — FENTANYL CITRATE (PF) 100 MCG/2ML IJ SOLN
INTRAMUSCULAR | Status: DC | PRN
  Administered 2015-04-30: 25 ug via INTRAVENOUS

## 2015-04-30 MED ORDER — IOPAMIDOL (ISOVUE-370) INJECTION 76%
INTRAVENOUS | Status: DC | PRN
  Administered 2015-04-30: 85 mL via INTRAVENOUS

## 2015-04-30 MED ORDER — FENTANYL CITRATE (PF) 100 MCG/2ML IJ SOLN
INTRAMUSCULAR | Status: AC
  Filled 2015-04-30: qty 2

## 2015-04-30 MED ORDER — VERAPAMIL HCL 2.5 MG/ML IV SOLN
INTRAVENOUS | Status: AC
  Filled 2015-04-30: qty 2

## 2015-04-30 MED ORDER — MIDAZOLAM HCL 2 MG/2ML IJ SOLN
INTRAMUSCULAR | Status: AC
  Filled 2015-04-30: qty 2

## 2015-04-30 MED ORDER — HEPARIN (PORCINE) IN NACL 2-0.9 UNIT/ML-% IJ SOLN
INTRAMUSCULAR | Status: DC | PRN
  Administered 2015-04-30: 1000 mL

## 2015-04-30 MED ORDER — SODIUM CHLORIDE 0.9% FLUSH: 3.0000 mL | INTRAVENOUS | Status: DC | PRN

## 2015-04-30 MED ORDER — HEPARIN SODIUM (PORCINE) 1000 UNIT/ML IJ SOLN
INTRAMUSCULAR | Status: AC
  Filled 2015-04-30: qty 1

## 2015-04-30 MED ORDER — ASPIRIN 81 MG PO CHEW
81.0000 mg | CHEWABLE_TABLET | ORAL | Status: AC
  Administered 2015-04-30: 81 mg via ORAL

## 2015-04-30 MED ORDER — HEPARIN (PORCINE) IN NACL 2-0.9 UNIT/ML-% IJ SOLN
INTRAMUSCULAR | Status: AC
  Filled 2015-04-30: qty 1000

## 2015-04-30 MED ORDER — VERAPAMIL HCL 2.5 MG/ML IV SOLN
INTRAVENOUS | Status: DC | PRN
  Administered 2015-04-30: 09:00:00 via INTRA_ARTERIAL

## 2015-04-30 MED ORDER — SODIUM CHLORIDE 0.9 % IV SOLN: 250.0000 mL | INTRAVENOUS | Status: DC | PRN

## 2015-04-30 MED ORDER — SODIUM CHLORIDE 0.9 % IV SOLN
INTRAVENOUS | Status: DC
  Administered 2015-04-30: 07:00:00 via INTRAVENOUS

## 2015-04-30 MED ORDER — LIDOCAINE HCL (PF) 1 % IJ SOLN
INTRAMUSCULAR | Status: DC | PRN
  Administered 2015-04-30: 3 mL

## 2015-04-30 MED ORDER — MIDAZOLAM HCL 2 MG/2ML IJ SOLN
INTRAMUSCULAR | Status: DC | PRN
  Administered 2015-04-30: 2 mg via INTRAVENOUS

## 2015-04-30 MED ORDER — SODIUM CHLORIDE 0.9% FLUSH: 3.0000 mL | Freq: Two times a day (BID) | INTRAVENOUS | Status: DC

## 2015-04-30 SURGICAL SUPPLY — 13 items
CATH INFINITI 5 FR JL3.5 (CATHETERS) ×2 IMPLANT
CATH INFINITI 5FR ANG PIGTAIL (CATHETERS) ×2 IMPLANT
CATH INFINITI 5FR JL5 (CATHETERS) ×2 IMPLANT
CATH INFINITI JR4 5F (CATHETERS) ×2 IMPLANT
DEVICE RAD COMP TR BAND LRG (VASCULAR PRODUCTS) ×2 IMPLANT
GLIDESHEATH SLEND SS 6F .021 (SHEATH) ×2 IMPLANT
KIT HEART LEFT (KITS) ×2 IMPLANT
PACK CARDIAC CATHETERIZATION (CUSTOM PROCEDURE TRAY) ×2 IMPLANT
SYR MEDRAD MARK V 150ML (SYRINGE) ×2 IMPLANT
TRANSDUCER W/STOPCOCK (MISCELLANEOUS) ×2 IMPLANT
TUBING CIL FLEX 10 FLL-RA (TUBING) ×2 IMPLANT
WIRE HI TORQ VERSACORE-J 145CM (WIRE) ×2 IMPLANT
WIRE SAFE-T 1.5MM-J .035X260CM (WIRE) ×2 IMPLANT

## 2015-04-30 NOTE — Progress Notes (Addendum)
Right radial band was removed by nurse and she stated it was bleeding.  I held pressure for 20 minutes, dressing applied after hemostasis was obtained.  Right radial level 0, radial pulse 2+. VSS  Dr Martinique was called/ pt to stay for one hour of observation.

## 2015-04-30 NOTE — Interval H&P Note (Signed)
History and Physical Interval Note:  04/30/2015 8:24 AM  Alexis Wheeler  has presented today for surgery, with the diagnosis of abnormal myoview  The various methods of treatment have been discussed with the patient and family. After consideration of risks, benefits and other options for treatment, the patient has consented to  Procedure(s): Left Heart Cath and Coronary Angiography (N/A) as a surgical intervention .  The patient's history has been reviewed, patient examined, no change in status, stable for surgery.  I have reviewed the patient's chart and labs.  Questions were answered to the patient's satisfaction.    Cath Lab Visit (complete for each Cath Lab visit)  Clinical Evaluation Leading to the Procedure:   ACS: No.  Non-ACS:    Anginal Classification: CCS III  Anti-ischemic medical therapy: Maximal Therapy (2 or more classes of medications)  Non-Invasive Test Results: No non-invasive testing performed  Prior CABG: No previous CABG       Col[facility] 04/30/2015 8:24 AM

## 2015-04-30 NOTE — Discharge Instructions (Signed)
Radial Site Care °Refer to this sheet in the next few weeks. These instructions provide you with information about caring for yourself after your procedure. Your health care provider may also give you more specific instructions. Your treatment has been planned according to current medical practices, but problems sometimes occur. Call your health care provider if you have any problems or questions after your procedure. °WHAT TO EXPECT AFTER THE PROCEDURE °After your procedure, it is typical to have the following: °· Bruising at the radial site that usually fades within 1-2 weeks. °· Blood collecting in the tissue (hematoma) that may be painful to the touch. It should usually decrease in size and tenderness within 1-2 weeks. °HOME CARE INSTRUCTIONS °· Take medicines only as directed by your health care provider. °· You may shower 24-48 hours after the procedure or as directed by your health care provider. Remove the bandage (dressing) and gently wash the site with plain soap and water. Pat the area dry with a clean towel. Do not rub the site, because this may cause bleeding. °· Do not take baths, swim, or use a hot tub until your health care provider approves. °· Check your insertion site every day for redness, swelling, or drainage. °· Do not apply powder or lotion to the site. °· Do not flex or bend the affected arm for 24 hours or as directed by your health care provider. °· Do not push or pull heavy objects with the affected arm for 24 hours or as directed by your health care provider. °· Do not lift over 10 lb (4.5 kg) for 5 days after your procedure or as directed by your health care provider. °· Ask your health care provider when it is okay to: °¨ Return to work or school. °¨ Resume usual physical activities or sports. °¨ Resume sexual activity. °· Do not drive home if you are discharged the same day as the procedure. Have someone else drive you. °· You may drive 24 hours after the procedure unless otherwise  instructed by your health care provider. °· Do not operate machinery or power tools for 24 hours after the procedure. °· If your procedure was done as an outpatient procedure, which means that you went home the same day as your procedure, a responsible adult should be with you for the first 24 hours after you arrive home. °· Keep all follow-up visits as directed by your health care provider. This is important. °SEEK MEDICAL CARE IF: °· You have a fever. °· You have chills. °· You have increased bleeding from the radial site. Hold pressure on the site. °SEEK IMMEDIATE MEDICAL CARE IF: °· You have unusual pain at the radial site. °· You have redness, warmth, or swelling at the radial site. °· You have drainage (other than a small amount of blood on the dressing) from the radial site. °· The radial site is bleeding, and the bleeding does not stop after 30 minutes of holding steady pressure on the site. °· Your arm or hand becomes pale, cool, tingly, or numb. °  °This information is not intended to replace advice given to you by your health care provider. Make sure you discuss any questions you have with your health care provider. °  °Document Released: 01/31/2010 Document Revised: 01/19/2014 Document Reviewed: 07/17/2013 °Elsevier Interactive Patient Education ©2016 [employer] ° °

## 2015-04-30 NOTE — H&P (View-Only) (Signed)
CARDIOLOGY OFFICE NOTE  Date:  04/26/2015    Alexis Wheeler Date of Birth: 10-20-1955 Medical Record [id number]  PCP:  No PCP Per Patient  Cardiologist:  Radford Pax    Chief Complaint  Patient presents with  . FU after Myoview.    Seen for Dr. Radford Pax.    History of Present Illness: Alexis Wheeler is a 60 y.o. female who presents today for a follow up visit after her Myoview.   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 She underwent replacement of the ascending aorta with resuspension of the AV with 30 mm Hemashield Platinum graft with R axillary artery cannulation by Dr. Servando Snare. EF was 40-45% post op. Follow up echo from 07/2014 showed improvement of her EF - now at 55 to 60%.   Admitted back in December with uncontrolled HTN. Medicines were all changed. She had stopped some.  She was referred back here for management of BP. Did not have PCP. She was placed on spironolactone and changed her atenolol to carvedilol.   I have seen her back several times over the past few weeks - BP still too high. Did not tolerate Hydralazine. Complained of what sounded like exertional fatigue - she was not cathed prior to her AAA dissection and I was worried about CAD. Myoview was obtained - see below.   Comes back today. Here alone. Having some pain in her left side - has had kidney stones in the past. Using some Tramadol that she has on hand. No active chest pain but does continue to have this exertional fatigue - getting worse. This just started right before she had her dissection last year. BP looks much better.   Past Medical History  Diagnosis Date  . Morbid obesity ([place])   . HEMATURIA UNSPECIFIED   . GERD   . MYOSITIS   . HYPERTENSION   . HYPERLIPIDEMIA   . Vertigo   . Congestive dilated cardiomyopathy ([place]) 08/01/2014    Past Surgical History  Procedure  Laterality Date  . Cholecystectomy  2001  . Abdominal hysterectomy  1983  . Replacement ascending aorta N/A 03/03/2014    Procedure: REPAIR OF ASCENDING AORTIC DISSECTION;  Surgeon: Grace Isaac, MD;  Location: [facility];  Service: Open Heart Surgery;  Laterality: N/A;     Medications: Current Outpatient Prescriptions  Medication Sig Dispense Refill  . amLODipine (NORVASC) 10 MG tablet Take 1 tablet (10 mg total) by mouth daily. 30 tablet 11  . aspirin EC 81 MG EC tablet Take 1 tablet (81 mg total) by mouth daily.    [name] atorvastatin (LIPITOR) 40 MG tablet Take 1 tablet (40 mg total) by mouth daily at 6 PM. 30 tablet 6  . carvedilol (COREG) 6.25 MG tablet Take 1 tablet (6.25 mg total) by mouth 2 (two) times daily. 60 tablet 11  . folic acid (FOLVITE) 1 MG tablet Take 1 tablet (1 mg total) by mouth daily.    [name] lisinopril (PRINIVIL,ZESTRIL) 40 MG tablet Take 1 tablet (40 mg total) by mouth daily. 30 tablet 11  . spironolactone (ALDACTONE) 50 MG tablet Take 1 tablet (50 mg total) by mouth daily. 30 tablet 6   No current facility-administered medications for this visit.    Allergies: Allergies  Allergen Reactions  . Hydralazine Nausea Only  . Ibuprofen Other (See Comments)  REACTION: Upset GI (motrin brand causes the side effect)    Social History: The patient  reports that she has never smoked. She does not have any smokeless tobacco history on file. She reports that she does not drink alcohol or use illicit drugs.   Family History: The patient's family history includes Aortic dissection (age of onset: 72) in her mother; Breast cancer in her sister; Breast cancer (age of onset: 17) in her mother; Hypertension in her daughter, father, mother, and sister; Throat cancer in her brother. There is no history of Seizures.   Review of Systems: Please see the history of present illness.   Otherwise, the review of systems is positive for none.   All other systems are reviewed and negative.    Physical Exam: VS:  BP 130/62 mmHg  Pulse 56  Ht 5\' 4"  (1.626 m)  Wt 180 lb 1.9 oz (81.702 kg)  BMI 30.90 kg/m2 .  BMI Body mass index is 30.9 kg/(m^2).  Wt Readings from Last 3 Encounters:  04/26/15 180 lb 1.9 oz (81.702 kg)  04/22/15 181 lb (82.101 kg)  04/16/15 181 lb 12.8 oz (82.464 kg)    General: Pleasant obese black female who is alert and in no acute distress.  HEENT: Normal. Neck: Supple, no JVD, carotid bruits, or masses noted.  Cardiac: Regular rate and rhythm. No murmurs, rubs, or gallops. No edema.  Respiratory:  Lungs are clear to auscultation bilaterally with normal work of breathing.  GI: Soft and nontender.  MS: No deformity or atrophy. Gait and ROM intact. Skin: Warm and dry. Color is normal.  Neuro:  Strength and sensation are intact and no gross focal deficits noted.  Psych: Alert, appropriate and with normal affect.   LABORATORY DATA:  EKG:  EKG is not ordered today.  Lab Results  Component Value Date   WBC 5.6 04/12/2015   HGB 13.1 04/12/2015   HCT 40.8 04/12/2015   PLT 201 04/12/2015   GLUCOSE 81 04/23/2015   CHOL 87 03/06/2014   TRIG 108 03/06/2014   HDL 27* 03/06/2014   LDLCALC 38 03/06/2014   ALT 20 03/02/2014   AST 31 03/02/2014   NA 140 04/23/2015   K 4.0 04/23/2015   CL 104 04/23/2015   CREATININE 1.01* 04/23/2015   BUN 13 04/23/2015   CO2 28 04/23/2015   TSH 0.78 03/08/2015   INR 0.96 03/03/2014   HGBA1C 5.8* 03/07/2014    BNP (last 3 results) No results for input(s): BNP in the last 8760 hours.  ProBNP (last 3 results) No results for input(s): PROBNP in the last 8760 hours.   Other Studies Reviewed Today:  Myoview Study Highlights 04/2015    The left ventricular ejection fraction is moderately decreased (30-44%).  Nuclear stress EF: 42%.  Defect 1: There is a large defect of moderate severity.  Findings consistent with ischemia.  No T wave inversion was noted during stress.  There was no ST segment deviation  noted during stress.  Large size, moderate severity reversible (SDS 9) inferolateral perfusion defect suggestive of ischemia. LVEF 42% with inferolateral hypokinesis. This is a high risk study based on the extent of ischemia.   Echo Study Conclusions from 07/2014  - Left ventricle: The cavity size was normal. There was mild focal  basal hypertrophy of the septum. Systolic function was normal.  The estimated ejection fraction was in the range of 55% to 60%.  Wall motion was normal; there were no regional wall motion  abnormalities. There was  an increased relative contribution of  atrial contraction to ventricular filling. Doppler parameters are  consistent with abnormal left ventricular relaxation (grade 1  diastolic dysfunction). - Aortic valve: Trileaflet; normal thickness, mildly calcified  leaflets. There was trivial regurgitation. - Aorta: Aortic root dimension: 42 mm (ED). - Aortic root: The aortic root was mildly dilated. - Mitral valve: There was trivial regurgitation.  Assessment/Plan:  1. HTN - aldactone was increased at last visit - BP much better on her current regimen.   2. HLD - on statin  3. Prior aortic dissection repair - needs good BP control - this was stressed again at today's visit.  4. Exertional Fatigue - now with high risk Myoview - need to proceed on with cardiac catheterization - The patient understands that risks include but are not limited to stroke (1 in 1000), death (1 in 1000), kidney failure [usually temporary] (1 in 500), bleeding (1 in 200), allergic reaction [possibly serious] (1 in 200), and agrees to proceed. Arranged for next week with Dr. Martinique.   5. Iron deficiency anemia - told to restart Folic acid at last visit  6. Prior dilated CM - last echo showed EF had recovered but is lower by recent Myoview.  Current medicines are reviewed with the patient today.  The patient does not have concerns regarding medicines other than what has  been noted above.  The following changes have been made:  See above.  Labs/ tests ordered today include:    Orders Placed This Encounter  Procedures  . Basic metabolic panel  . CBC  . Protime-INR  . APTT     Disposition:   I will see back in one month. Cardiac cath is arranged for next week with Dr. Martinique.   Patient is agreeable to this plan and will call if any problems develop in the interim.   Signed: Burtis Junes, RN, ANP-C 04/26/2015 3:18 PM  [facility] [address] [place], [place]  [address] Phone: [phone] Fax: [phone]

## 2015-05-01 ENCOUNTER — Ambulatory Visit: Payer: Medicaid Other | Admitting: Nurse Practitioner

## 2015-05-02 NOTE — Research (Signed)
[facility] RESEARCH STUDY Informed Consent   Subject Name: Alexis Wheeler  Subject met inclusion and exclusion criteria.  The informed consent form, study requirements and expectations were reviewed with the subject and questions and concerns were addressed prior to the signing of the consent form.  The subject verbalized understanding of the trail requirements.  The subject agreed to participate in the [place] trial and signed the informed consent.  The informed consent was obtained prior to performance of any protocol-specific procedures for the subject.  A copy of the signed informed consent was given to the subject and a copy was placed in the subject's medical record.  Desmond Dike H 04/30/2015, 07:00 AM

## 2015-05-06 MED FILL — AMLODIPINE BESYLATE 10 MG T: 10 | 30 days supply | Qty: 30 | Fill #1

## 2015-05-06 MED FILL — ATENOLOL 50 MG TABLET: 50 | 25 days supply | Qty: 25 | Fill #4

## 2015-05-06 MED FILL — FOLIC ACID 1 MG TABLET: 1 | 30 days supply | Qty: 30 | Fill #3

## 2015-05-06 MED FILL — LISINOPRIL 40 MG TABLET: 40 | 30 days supply | Qty: 30 | Fill #1

## 2015-05-06 MED FILL — ATORVASTATIN 40 MG TABLET: 40 | 30 days supply | Qty: 30 | Fill #3

## 2015-05-29 ENCOUNTER — Encounter: Payer: Self-pay | Admitting: Obstetrics and Gynecology

## 2015-06-05 ENCOUNTER — Telehealth: Payer: Self-pay | Admitting: *Deleted

## 2015-06-05 NOTE — Telephone Encounter (Signed)
Moved pt's appointment from 3:30  To 3:00 on Friday May 26 due to provider leaving early.

## 2015-06-07 ENCOUNTER — Ambulatory Visit (INDEPENDENT_AMBULATORY_CARE_PROVIDER_SITE_OTHER): Payer: BLUE CROSS/BLUE SHIELD | Admitting: Nurse Practitioner

## 2015-06-07 ENCOUNTER — Ambulatory Visit: Payer: Medicaid Other | Admitting: Nurse Practitioner

## 2015-06-07 ENCOUNTER — Encounter: Payer: Self-pay | Admitting: Nurse Practitioner

## 2015-06-07 VITALS — BP 118/70 | HR 62 | Ht 63.0 in | Wt 184.8 lb

## 2015-06-07 DIAGNOSIS — E785 Hyperlipidemia, unspecified: Secondary | ICD-10-CM | POA: Diagnosis not present

## 2015-06-07 DIAGNOSIS — I71019 Dissection of thoracic aorta, unspecified: Secondary | ICD-10-CM

## 2015-06-07 DIAGNOSIS — I10 Essential (primary) hypertension: Secondary | ICD-10-CM

## 2015-06-07 DIAGNOSIS — I7101 Dissection of thoracic aorta: Secondary | ICD-10-CM | POA: Diagnosis not present

## 2015-06-07 DIAGNOSIS — Z9889 Other specified postprocedural states: Secondary | ICD-10-CM

## 2015-06-07 DIAGNOSIS — R109 Unspecified abdominal pain: Secondary | ICD-10-CM

## 2015-06-07 LAB — BASIC METABOLIC PANEL
BUN: 17 mg/dL (ref 7–25)
CO2: 27 mmol/L (ref 20–31)
Calcium: 9.2 mg/dL (ref 8.6–10.4)
Chloride: 107 mmol/L (ref 98–110)
Creat: 1.45 mg/dL — ABNORMAL HIGH (ref 0.50–0.99)
Glucose, Bld: 86 mg/dL (ref 65–99)
Potassium: 4 mmol/L (ref 3.5–5.3)
Sodium: 142 mmol/L (ref 135–146)

## 2015-06-07 LAB — CBC
HCT: 38 % (ref 35.0–45.0)
Hemoglobin: 12.3 g/dL (ref 11.7–15.5)
MCH: 22.7 pg — ABNORMAL LOW (ref 27.0–33.0)
MCHC: 32.4 g/dL (ref 32.0–36.0)
MCV: 70.2 fL — ABNORMAL LOW (ref 80.0–100.0)
MPV: 10.2 fL (ref 7.5–12.5)
Platelets: 215 10*3/uL (ref 140–400)
RBC: 5.41 MIL/uL — ABNORMAL HIGH (ref 3.80–5.10)
RDW: 17 % — ABNORMAL HIGH (ref 11.0–15.0)
WBC: 7.2 10*3/uL (ref 3.8–10.8)

## 2015-06-07 NOTE — Progress Notes (Signed)
CARDIOLOGY OFFICE NOTE  Date:  06/07/2015    Alexis Wheeler Date of Birth: 1955-09-18 Medical Record [id number]  PCP:  No PCP Per Patient  Cardiologist:  Radford Pax.     Chief Complaint  Patient presents with  . Hypertension    Follow up visit - seen for Dr. Radford Pax    History of Present Illness: Alexis Wheeler is a 60 y.o. female who presents today for a follow up visit. 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 She underwent replacement of the ascending aorta with resuspension of the AV with 30 mm Hemashield Platinum graft with R axillary artery cannulation by Dr. Servando Snare. EF was 40-45% post op. Follow up echo from 07/2014 showed improvement of her EF - now at 55 to 60%.   Admitted back in December with uncontrolled HTN. Medicines were all changed. She had stopped some.  She was referred back here for management of BP. Did not have PCP. She was placed on spironolactone and changed her atenolol to carvedilol.   I have seen her back several times over the past few weeks - BP still too high. Did not tolerate Hydralazine. Complained of what sounded like exertional fatigue - she was not cathed prior to her AAA dissection and I was worried about CAD. Myoview was obtained - turned out to be high risk - was referred for cath - this was reassuring - only with very mild CAD that is nonobstructive.   Comes back today. Here alone.  BP has improved. She is taking her medicines. Not dizzy. She still has the feelings of exertional fatigue - she just notes that she stops and sits and it will pass. Having some pain in her left side - has had what she thought was kidney stones in the past - never actually saw anyone. Really does not have primary care. Has used some Tramadol that she has on hand. No dysuria reported.   Past Medical History  Diagnosis Date  . Morbid obesity  ([place])   . HEMATURIA UNSPECIFIED   . GERD   . MYOSITIS   . HYPERTENSION   . HYPERLIPIDEMIA   . Vertigo   . Congestive dilated cardiomyopathy ([place]) 08/01/2014    Past Surgical History  Procedure Laterality Date  . Cholecystectomy  2001  . Abdominal hysterectomy  1983  . Replacement ascending aorta N/A 03/03/2014    Procedure: REPAIR OF ASCENDING AORTIC DISSECTION;  Surgeon: Grace Isaac, MD;  Location: [facility];  Service: Open Heart Surgery;  Laterality: N/A;  . Cardiac catheterization N/A 04/30/2015    Procedure: Left Heart Cath and Coronary Angiography;  Surgeon: Peter M Martinique, MD;  Location: [facility];  Service: Cardiovascular;  Laterality: N/A;     Medications: Current Outpatient Prescriptions  Medication Sig Dispense Refill  . amLODipine (NORVASC) 10 MG tablet Take 1 tablet (10 mg total) by mouth daily. 30 tablet 11  . aspirin EC 81 MG EC tablet Take 1 tablet (81 mg total) by mouth daily.    [name] atenolol (TENORMIN) 50 MG tablet Take 50 mg by mouth daily.  3  . atorvastatin (LIPITOR) 40 MG tablet Take 1 tablet (40 mg total) by mouth daily at 6 PM. 30 tablet 6  . carvedilol (COREG) 6.25 MG tablet Take 1 tablet (6.25 mg total) by mouth 2 (two) times daily.  60 tablet 11  . folic acid (FOLVITE) 1 MG tablet Take 1 tablet (1 mg total) by mouth daily.    [name] lisinopril (PRINIVIL,ZESTRIL) 40 MG tablet Take 1 tablet (40 mg total) by mouth daily. 30 tablet 11  . spironolactone (ALDACTONE) 50 MG tablet Take 1 tablet (50 mg total) by mouth daily. 30 tablet 6  . vitamin E 400 UNIT capsule Take 400 Units by mouth daily.     No current facility-administered medications for this visit.    Allergies: Allergies  Allergen Reactions  . Hydralazine Nausea Only  . Motrin [Ibuprofen] Other (See Comments)    Upset GI (motrin brand causes the side effect)    Social History: The patient  reports that she has never smoked. She does not have any smokeless tobacco history on file. She reports  that she does not drink alcohol or use illicit drugs.   Family History: The patient's family history includes Aortic dissection (age of onset: 48) in her mother; Breast cancer in her sister; Breast cancer (age of onset: 32) in her mother; Hypertension in her daughter, father, mother, and sister; Throat cancer in her brother. There is no history of Seizures.   Review of Systems: Please see the history of present illness.   Otherwise, the review of systems is positive for none.   All other systems are reviewed and negative.   Physical Exam: VS:  BP 118/70 mmHg  Pulse 62  Ht 5\' 3"  (1.6 m)  Wt 184 lb 12 oz (83.802 kg)  BMI 32.74 kg/m2 .  BMI Body mass index is 32.74 kg/(m^2).  Wt Readings from Last 3 Encounters:  06/07/15 184 lb 12 oz (83.802 kg)  04/30/15 180 lb (81.647 kg)  04/26/15 180 lb 1.9 oz (81.702 kg)    General: Pleasant. Well developed, well nourished and in no acute distress.  HEENT: Normal. Neck: Supple, no JVD, carotid bruits, or masses noted.  Cardiac: Regular rate and rhythm. No murmurs, rubs, or gallops. No edema.  Respiratory:  Lungs are clear to auscultation bilaterally with normal work of breathing.  GI: Soft and nontender.  MS: No deformity or atrophy. Gait and ROM intact. Skin: Warm and dry. Color is normal.  Neuro:  Strength and sensation are intact and no gross focal deficits noted.  Psych: Alert, appropriate and with normal affect.   LABORATORY DATA:  EKG:  EKG is not ordered today.  Lab Results  Component Value Date   WBC 8.3 04/26/2015   HGB 12.8 04/26/2015   HCT 39.7 04/26/2015   PLT 197 04/26/2015   GLUCOSE 104* 04/26/2015   CHOL 87 03/06/2014   TRIG 108 03/06/2014   HDL 27* 03/06/2014   LDLCALC 38 03/06/2014   ALT 20 03/02/2014   AST 31 03/02/2014   NA 138 04/26/2015   K 4.1 04/26/2015   CL 105 04/26/2015   CREATININE 1.01* 04/26/2015   BUN 18 04/26/2015   CO2 25 04/26/2015   TSH 0.78 03/08/2015   INR 0.94 04/26/2015   HGBA1C 5.8*  03/07/2014    BNP (last 3 results) No results for input(s): BNP in the last 8760 hours.  ProBNP (last 3 results) No results for input(s): PROBNP in the last 8760 hours.   Other Studies Reviewed Today:  Cardiac Cath Procedures 04/2015    Left Heart Cath and Coronary Angiography    Conclusion     Ost RCA to Prox RCA lesion, 30% stenosed.  Mid LAD lesion, 20% stenosed.  The left ventricular systolic function  is normal.  1. Mild nonobstructive CAD 2. Normal LV function.  Plan: medical management.      Myoview Study Highlights 04/2015    The left ventricular ejection fraction is moderately decreased (30-44%).  Nuclear stress EF: 42%.  Defect 1: There is a large defect of moderate severity.  Findings consistent with ischemia.  No T wave inversion was noted during stress.  There was no ST segment deviation noted during stress.  Large size, moderate severity reversible (SDS 9) inferolateral perfusion defect suggestive of ischemia. LVEF 42% with inferolateral hypokinesis. This is a high risk study based on the extent of ischemia.   Echo Study Conclusions from 07/2014  - Left ventricle: The cavity size was normal. There was mild focal  basal hypertrophy of the septum. Systolic function was normal.  The estimated ejection fraction was in the range of 55% to 60%.  Wall motion was normal; there were no regional wall motion  abnormalities. There was an increased relative contribution of  atrial contraction to ventricular filling. Doppler parameters are  consistent with abnormal left ventricular relaxation (grade 1  diastolic dysfunction). - Aortic valve: Trileaflet; normal thickness, mildly calcified  leaflets. There was trivial regurgitation. - Aorta: Aortic root dimension: 42 mm (ED). - Aortic root: The aortic root was mildly dilated. - Mitral valve: There was trivial regurgitation.  Assessment/Plan:  1. HTN - BP recheck by me is 140/80. She has had  considerable improvement and is now compliant with her current regimen. [place] lab today. See back in 3 months.   2. HLD - on statin - she needs labs on return.   3. Prior aortic dissection repair - to see Dr. Servando Snare next year in February.  4. Exertional Fatigue - had high risk Myoview - but with reassuring cardiac cath findings.   5. Iron deficiency anemia - back on Folic acid at last visit  6. Prior dilated CM - last echo showed EF had recovered but was lower by recent Myoview - yet normal at time of cardiac cath - she looks good clinically and I have left her on her current regimen.  7. Left flank pain - will send her for a renal ultrasound. May end up needing CT. Will check UA as well.        Current medicines are reviewed with the patient today.  The patient does not have concerns regarding medicines other than what has been noted above.  The following changes have been made:  See above.  Labs/ tests ordered today include:    Orders Placed This Encounter  Procedures  . Urine culture  . US Renal  . Urinalysis  . Basic metabolic panel  . CBC     Disposition:   FU with me in 3 months.   Patient is agreeable to this plan and will call if any problems develop in the interim.   Signed: Burtis Junes, RN, ANP-C 06/07/2015 3:35 PM  [facility] [address] [place], [place]  [address] Phone: [phone] Fax: [phone]

## 2015-06-07 NOTE — Patient Instructions (Addendum)
We will be checking the following labs today - UA, urine culture, BMET & CBC   Medication Instructions:    Continue with your current medicines.     Testing/Procedures To Be Arranged:  Renal ultrasound - [facility] - left flank pain  Follow-Up:   See me in 3 months    Other Special Instructions:   N/A    If you need a refill on your cardiac medications before your next appointment, please call your pharmacy.   Call the [facility] office at [phone] if you have any questions, problems or concerns.

## 2015-06-08 LAB — URINALYSIS
Bilirubin Urine: NEGATIVE
Glucose, UA: NEGATIVE
Hgb urine dipstick: NEGATIVE
Ketones, ur: NEGATIVE
Nitrite: NEGATIVE
Specific Gravity, Urine: 1.02 (ref 1.001–1.035)
pH: 5.5 (ref 5.0–8.0)

## 2015-06-10 LAB — URINE CULTURE
Colony Count: NO GROWTH
Organism ID, Bacteria: NO GROWTH

## 2015-06-11 ENCOUNTER — Other Ambulatory Visit: Payer: Self-pay | Admitting: *Deleted

## 2015-06-14 MED FILL — ATORVASTATIN 40 MG TABLET: 40 | 30 days supply | Qty: 30 | Fill #4

## 2015-06-14 MED FILL — hydrALAZINE HCL 50 MG TABS: 50 | 30 days supply | Qty: 135 | Fill #0

## 2015-06-14 MED FILL — ?ATENOLOL 50 MG TABLET: 50 | 25 days supply | Qty: 25 | Fill #5

## 2015-06-14 MED FILL — FOLIC ACID 1 MG TABLET: 1 | 30 days supply | Qty: 30 | Fill #4

## 2015-06-14 MED FILL — ?AMLODIPINE BESYLATE 10 MG: 10 | 30 days supply | Qty: 30 | Fill #2

## 2015-06-14 MED FILL — SPIRONOLACTONE 50 MG TABLET: 50 | 30 days supply | Qty: 30 | Fill #0

## 2015-06-14 MED FILL — LISINOPRIL 40 MG TABLET: 40 | 30 days supply | Qty: 30 | Fill #2

## 2015-06-18 ENCOUNTER — Other Ambulatory Visit: Payer: BLUE CROSS/BLUE SHIELD

## 2015-06-21 ENCOUNTER — Telehealth: Payer: Self-pay | Admitting: *Deleted

## 2015-06-21 ENCOUNTER — Other Ambulatory Visit: Payer: Self-pay | Admitting: *Deleted

## 2015-06-21 NOTE — Telephone Encounter (Signed)
Pt called in today to let us know pt no showed for renal US at [facility] for flank pain.  Would like [place] to reschedule. T/w Vashti Hey at check out and gave mrn number and name stated will call [facility] and re-schedule appointment.  Will also call pt to make them aware of appointment time. Neoma Laming will send message when done.

## 2015-06-28 ENCOUNTER — Ambulatory Visit
Admission: RE | Admit: 2015-06-28 | Discharge: 2015-06-28 | Disposition: A | Discharge status: Home / Self Care | Payer: BLUE CROSS/BLUE SHIELD | Source: Ambulatory Visit | Attending: Nurse Practitioner | Admitting: Nurse Practitioner

## 2015-06-28 DIAGNOSIS — Z9889 Other specified postprocedural states: Secondary | ICD-10-CM

## 2015-06-28 DIAGNOSIS — I10 Essential (primary) hypertension: Secondary | ICD-10-CM

## 2015-06-28 DIAGNOSIS — I7101 Dissection of thoracic aorta: Secondary | ICD-10-CM

## 2015-06-28 DIAGNOSIS — R109 Unspecified abdominal pain: Secondary | ICD-10-CM

## 2015-06-28 DIAGNOSIS — E785 Hyperlipidemia, unspecified: Secondary | ICD-10-CM

## 2015-06-28 DIAGNOSIS — I71019 Dissection of thoracic aorta, unspecified: Secondary | ICD-10-CM

## 2015-07-02 ENCOUNTER — Telehealth: Payer: Self-pay

## 2015-07-02 DIAGNOSIS — R93429 Abnormal radiologic findings on diagnostic imaging of unspecified kidney: Secondary | ICD-10-CM

## 2015-07-02 NOTE — Telephone Encounter (Signed)
Informed patient of results and verbal understanding expressed.  Referral for urology placed. Patient agrees with treatment plan.

## 2015-07-02 NOTE — Telephone Encounter (Signed)
-----   Message from Burtis Junes, NP sent at 06/30/2015  8:34 AM EDT ----- Ok to report. Her ultrasound does show a stone in the left kidney. We need to refer her to urology - [facility] - for further evaluation.

## 2015-07-04 ENCOUNTER — Telehealth: Payer: Self-pay

## 2015-07-04 NOTE — Telephone Encounter (Signed)
Message received from [facility] today:  urology referral      Armando Gang    Sent: Thu July 04, 2015 10:02 AM    To: Theodoro Parma, RN        Message     07-04-15 Mickel Baas with [facility] called to let us know that she spoke with Ms. Hamric who turn down the appointment to see the resident. She was offered the appointment with the resident vs MD due to the she is self pay and would need to pay @250 .00. The appointment with the resident would had been free.  She stated that she wanted to pass the stone herself.

## 2015-07-23 MED FILL — LISINOPRIL 40 MG TABLET: 40 | 30 days supply | Qty: 30 | Fill #3

## 2015-07-23 MED FILL — FOLIC ACID 1 MG TABLET: 1 | 30 days supply | Qty: 30 | Fill #5

## 2015-07-23 MED FILL — AMLODIPINE BESYLATE 10 MG T: 10 | 30 days supply | Qty: 30 | Fill #3

## 2015-07-29 ENCOUNTER — Other Ambulatory Visit: Payer: Self-pay | Admitting: Urology

## 2015-07-31 ENCOUNTER — Other Ambulatory Visit (HOSPITAL_COMMUNITY): Payer: Self-pay | Admitting: Anesthesiology

## 2015-07-31 MED FILL — ?ATENOLOL 25 MG TABLET: 25 | 30 days supply | Qty: 60 | Fill #0

## 2015-07-31 NOTE — Patient Instructions (Addendum)
Alexis Wheeler  07/31/2015   Your procedure is scheduled on: 08/02/15  Report to [facility]  Wheeler take [place]  elevators to 3rd floor to  [place] at [place] AM.  Call this number if you have problems the morning of surgery [phone]   Remember: ONLY 1 PERSON MAY GO WITH YOU TO SHORT STAY TO GET  READY MORNING OF YOUR SURGERY.  Do not eat food or drink liquids :After Midnight.     Take these medicines the morning of surgery with A SIP OF WATER: ATENOLOL,Carvedilol, Amlodipine DO NOT TAKE ANY DIABETIC MEDICATIONS DAY OF YOUR SURGERY           BRING CPAP MASK AND TUBING WITH YOU TO HOSPITAL                    You may not have any metal on your body including hair pins and              piercings  Do not wear jewelry, make-up, lotions, powders or perfumes, deodorant             Do not wear nail polish.  Do not shave  48 hours prior to surgery.              Men may shave face and neck.   Do not bring valuables to the hospital. [place].  Contacts, dentures or bridgework may not be worn into surgery.  Leave suitcase in the car. After surgery it may be brought to your room.     Patients discharged the day of surgery will not be allowed to drive home.  Name and phone number of your driver: daughter  Alexis Mc VI:[phone]  Special Instructions: N/A              Please read over the following fact sheets you were given: _____________________________________________________________________             [facility] - Preparing for Surgery Before surgery, you can play an important role.  Because skin is not sterile, your skin needs to be as free of germs as possible.  You can reduce the number of germs on your skin by washing with CHG (chlorahexidine gluconate) soap before surgery.  CHG is an antiseptic cleaner which kills germs and bonds with the skin to continue killing germs even  after washing. Please DO NOT use if you have an allergy to CHG or antibacterial soaps.  If your skin becomes reddened/irritated stop using the CHG and inform your nurse when you arrive at Short Stay. Do not shave (including legs and underarms) for at least 48 hours prior to the first CHG shower.  You may shave your face/neck. Please follow these instructions carefully:  1.  Shower with CHG Soap the night before surgery and the  morning of Surgery.  2.  If you choose to wash your hair, wash your hair first as usual with your  normal  shampoo.  3.  After you shampoo, rinse your hair and body thoroughly to remove the  shampoo.                           4.  Use CHG as you would any other  liquid soap.  You can apply chg directly  to the skin and wash                       Gently with a scrungie or clean washcloth.  5.  Apply the CHG Soap to your body ONLY FROM THE NECK DOWN.   Do not use on face/ open                           Wound or open sores. Avoid contact with eyes, ears mouth and genitals (private parts).                       Wash face,  Genitals (private parts) with your normal soap.             6.  Wash thoroughly, paying special attention to the area where your surgery  will be performed.  7.  Thoroughly rinse your body with warm water from the neck down.  8.  DO NOT shower/wash with your normal soap after using and rinsing off  the CHG Soap.                9.  Pat yourself dry with a clean towel.            10.  Wear clean pajamas.            11.  Place clean sheets on your bed the night of your first shower and do not  sleep with pets. Day of Surgery : Do not apply any lotions/deodorants the morning of surgery.  Please wear clean clothes to the hospital/surgery center.  FAILURE TO FOLLOW THESE INSTRUCTIONS MAY RESULT IN THE CANCELLATION OF YOUR SURGERY PATIENT SIGNATURE_________________________________  NURSE  SIGNATURE__________________________________  ________________________________________________________________________ [facility] - Preparing for Surgery Before surgery, you can play an important role.  Because skin is not sterile, your skin needs to be as free of germs as possible.  You can reduce the number of germs on your skin by washing with CHG (chlorahexidine gluconate) soap before surgery.  CHG is an antiseptic cleaner which kills germs and bonds with the skin to continue killing germs even after washing. Please DO NOT use if you have an allergy to CHG or antibacterial soaps.  If your skin becomes reddened/irritated stop using the CHG and inform your nurse when you arrive at Short Stay. Do not shave (including legs and underarms) for at least 48 hours prior to the first CHG shower.  You may shave your face/neck. Please follow these instructions carefully:  1.  Shower with CHG Soap the night before surgery and the  morning of Surgery.  2.  If you choose to wash your hair, wash your hair first as usual with your  normal  shampoo.  3.  After you shampoo, rinse your hair and body thoroughly to remove the  shampoo.                           4.  Use CHG as you would any other liquid soap.  You can apply chg directly  to the skin and wash                       Gently with a scrungie or clean washcloth.  5.  Apply the CHG Soap to your body ONLY FROM THE NECK DOWN.  Do not use on face/ open                           Wound or open sores. Avoid contact with eyes, ears mouth and genitals (private parts).                       Wash face,  Genitals (private parts) with your normal soap.             6.  Wash thoroughly, paying special attention to the area where your surgery  will be performed.  7.  Thoroughly rinse your body with warm water from the neck down.  8.  DO NOT shower/wash with your normal soap after using and rinsing off  the CHG Soap.                9.  Pat yourself dry with a clean  towel.            10.  Wear clean pajamas.            11.  Place clean sheets on your bed the night of your first shower and do not  sleep with pets. Day of Surgery : Do not apply any lotions/deodorants the morning of surgery.  Please wear clean clothes to the hospital/surgery center.  FAILURE TO FOLLOW THESE INSTRUCTIONS MAY RESULT IN THE CANCELLATION OF YOUR SURGERY PATIENT SIGNATURE_________________________________  NURSE SIGNATURE__________________________________  ________________________________________________________________________

## 2015-07-31 NOTE — Progress Notes (Signed)
Stress test 04/22/15 epic Ct angio chest/ aorta 2/17 Cath 04/30/15 eccho 4/16 ekg 4/17, 2/17  ALL IN EPIC

## 2015-08-01 ENCOUNTER — Encounter (HOSPITAL_COMMUNITY): Payer: Self-pay

## 2015-08-01 ENCOUNTER — Encounter (HOSPITAL_COMMUNITY)
Admission: RE | Admit: 2015-08-01 | Discharge: 2015-08-01 | Disposition: A | Discharge status: Home / Self Care | Payer: BLUE CROSS/BLUE SHIELD | Source: Ambulatory Visit | Attending: Urology | Admitting: Urology

## 2015-08-01 DIAGNOSIS — Z01812 Encounter for preprocedural laboratory examination: Secondary | ICD-10-CM | POA: Insufficient documentation

## 2015-08-01 HISTORY — DX: Sleep apnea, unspecified: G47.30

## 2015-08-01 HISTORY — DX: Personal history of urinary calculi: Z87.442

## 2015-08-01 HISTORY — DX: Unspecified osteoarthritis, unspecified site: M19.90

## 2015-08-01 LAB — BASIC METABOLIC PANEL
Anion gap: 6 (ref 5–15)
BUN: 16 mg/dL (ref 6–20)
CO2: 26 mmol/L (ref 22–32)
Calcium: 8.9 mg/dL (ref 8.9–10.3)
Chloride: 108 mmol/L (ref 101–111)
Creatinine, Ser: 1.29 mg/dL — ABNORMAL HIGH (ref 0.44–1.00)
GFR calc Af Amer: 51 mL/min — ABNORMAL LOW (ref >60)
GFR calc non Af Amer: 44 mL/min — ABNORMAL LOW (ref >60)
Glucose, Bld: 91 mg/dL (ref 65–99)
Potassium: 4.4 mmol/L (ref 3.5–5.1)
Sodium: 140 mmol/L (ref 135–145)

## 2015-08-01 LAB — CBC
HCT: 40.4 % (ref 36.0–46.0)
Hemoglobin: 12.5 g/dL (ref 12.0–15.0)
MCH: 22.7 pg — ABNORMAL LOW (ref 26.0–34.0)
MCHC: 30.9 g/dL (ref 30.0–36.0)
MCV: 73.5 fL — ABNORMAL LOW (ref 78.0–100.0)
Platelets: 224 10*3/uL (ref 150–400)
RBC: 5.5 MIL/uL — ABNORMAL HIGH (ref 3.87–5.11)
RDW: 15.7 % — ABNORMAL HIGH (ref 11.5–15.5)
WBC: 6.6 10*3/uL (ref 4.0–10.5)

## 2015-08-02 ENCOUNTER — Ambulatory Visit (HOSPITAL_COMMUNITY): Payer: Medicaid Other | Admitting: Anesthesiology

## 2015-08-02 ENCOUNTER — Encounter (HOSPITAL_COMMUNITY): Payer: Self-pay | Admitting: *Deleted

## 2015-08-02 ENCOUNTER — Ambulatory Visit (HOSPITAL_COMMUNITY)
Admission: RE | Admit: 2015-08-02 | Discharge: 2015-08-02 | Disposition: A | Discharge status: Home / Self Care | Payer: Medicaid Other | Source: Ambulatory Visit | Attending: Urology | Admitting: Urology

## 2015-08-02 ENCOUNTER — Encounter (HOSPITAL_COMMUNITY)
Admission: RE | Disposition: A | Discharge status: Home / Self Care | Payer: Self-pay | Source: Ambulatory Visit | Attending: Urology

## 2015-08-02 DIAGNOSIS — I10 Essential (primary) hypertension: Secondary | ICD-10-CM | POA: Diagnosis not present

## 2015-08-02 DIAGNOSIS — Z6831 Body mass index (BMI) 31.0-31.9, adult: Secondary | ICD-10-CM | POA: Diagnosis not present

## 2015-08-02 DIAGNOSIS — Z79899 Other long term (current) drug therapy: Secondary | ICD-10-CM | POA: Diagnosis not present

## 2015-08-02 DIAGNOSIS — M1990 Unspecified osteoarthritis, unspecified site: Secondary | ICD-10-CM | POA: Insufficient documentation

## 2015-08-02 DIAGNOSIS — N200 Calculus of kidney: Secondary | ICD-10-CM | POA: Insufficient documentation

## 2015-08-02 DIAGNOSIS — G4730 Sleep apnea, unspecified: Secondary | ICD-10-CM | POA: Diagnosis not present

## 2015-08-02 DIAGNOSIS — I2510 Atherosclerotic heart disease of native coronary artery without angina pectoris: Secondary | ICD-10-CM | POA: Diagnosis not present

## 2015-08-02 DIAGNOSIS — I739 Peripheral vascular disease, unspecified: Secondary | ICD-10-CM | POA: Diagnosis not present

## 2015-08-02 HISTORY — PX: CYSTOSCOPY/URETEROSCOPY/HOLMIUM LASER/STENT PLACEMENT: SHX6546

## 2015-08-02 HISTORY — PX: HOLMIUM LASER APPLICATION: SHX5852

## 2015-08-02 HISTORY — PX: CYSTOSCOPY W/ RETROGRADES: SHX1426

## 2015-08-02 HISTORY — PX: CYSTOSCOPY WITH URETEROSCOPY, STONE BASKETRY AND STENT PLACEMENT: SHX6378

## 2015-08-02 SURGERY — CYSTOSCOPY, WITH CALCULUS MANIPULATION OR REMOVAL
Anesthesia: General | Laterality: Left

## 2015-08-02 MED ORDER — SODIUM CHLORIDE 0.9 % IJ SOLN
INTRAMUSCULAR | Status: AC
  Filled 2015-08-02: qty 10

## 2015-08-02 MED ORDER — ONDANSETRON HCL 4 MG/2ML IJ SOLN
INTRAMUSCULAR | Status: AC
  Filled 2015-08-02: qty 2

## 2015-08-02 MED ORDER — SODIUM CHLORIDE 0.9 % IR SOLN
Status: DC | PRN
  Administered 2015-08-02: 8000 mL via INTRAVESICAL

## 2015-08-02 MED ORDER — CIPROFLOXACIN IN D5W 400 MG/200ML IV SOLN
400.0000 mg | INTRAVENOUS | Status: AC
  Administered 2015-08-02: 400 mg via INTRAVENOUS

## 2015-08-02 MED ORDER — DEXAMETHASONE SODIUM PHOSPHATE 10 MG/ML IJ SOLN
INTRAMUSCULAR | Status: AC
  Filled 2015-08-02: qty 1

## 2015-08-02 MED ORDER — PROPOFOL 10 MG/ML IV BOLUS
INTRAVENOUS | Status: DC | PRN
  Administered 2015-08-02: 150 mg via INTRAVENOUS

## 2015-08-02 MED ORDER — DEXAMETHASONE SODIUM PHOSPHATE 10 MG/ML IJ SOLN
INTRAMUSCULAR | Status: DC | PRN
  Administered 2015-08-02: 10 mg via INTRAVENOUS

## 2015-08-02 MED ORDER — FENTANYL CITRATE (PF) 100 MCG/2ML IJ SOLN
INTRAMUSCULAR | Status: AC
  Filled 2015-08-02: qty 2

## 2015-08-02 MED ORDER — MIDAZOLAM HCL 2 MG/2ML IJ SOLN
INTRAMUSCULAR | Status: AC
  Filled 2015-08-02: qty 2

## 2015-08-02 MED ORDER — HYDROCODONE-ACETAMINOPHEN 5-325 MG PO TABS: 1.0000 | ORAL_TABLET | ORAL | Status: DC | PRN

## 2015-08-02 MED ORDER — MIDAZOLAM HCL 5 MG/5ML IJ SOLN
INTRAMUSCULAR | Status: DC | PRN
  Administered 2015-08-02: 0.5 mg via INTRAVENOUS
  Administered 2015-08-02: 1 mg via INTRAVENOUS
  Administered 2015-08-02: 0.5 mg via INTRAVENOUS

## 2015-08-02 MED ORDER — LIDOCAINE HCL (CARDIAC) 20 MG/ML IV SOLN
INTRAVENOUS | Status: DC | PRN
  Administered 2015-08-02: 50 mg via INTRAVENOUS

## 2015-08-02 MED ORDER — CIPROFLOXACIN IN D5W 400 MG/200ML IV SOLN
INTRAVENOUS | Status: AC
  Filled 2015-08-02: qty 200

## 2015-08-02 MED ORDER — IOHEXOL 300 MG/ML  SOLN
INTRAMUSCULAR | Status: DC | PRN
  Administered 2015-08-02: 4 mL

## 2015-08-02 MED ORDER — SULFAMETHOXAZOLE-TRIMETHOPRIM 800-160 MG PO TABS: 1.0000 | ORAL_TABLET | Freq: Two times a day (BID) | ORAL | Status: DC

## 2015-08-02 MED ORDER — LIDOCAINE HCL (CARDIAC) 20 MG/ML IV SOLN
INTRAVENOUS | Status: AC
  Filled 2015-08-02: qty 5

## 2015-08-02 MED ORDER — LACTATED RINGERS IV SOLN
INTRAVENOUS | Status: DC
  Administered 2015-08-02: 08:00:00 via INTRAVENOUS

## 2015-08-02 MED ORDER — PROPOFOL 10 MG/ML IV BOLUS
INTRAVENOUS | Status: AC
  Filled 2015-08-02: qty 20

## 2015-08-02 MED ORDER — LACTATED RINGERS IV SOLN
INTRAVENOUS | Status: DC | PRN
  Administered 2015-08-02 (×2): via INTRAVENOUS

## 2015-08-02 MED ORDER — 0.9 % SODIUM CHLORIDE (POUR BTL) OPTIME
TOPICAL | Status: DC | PRN
  Administered 2015-08-02: 1000 mL

## 2015-08-02 MED ORDER — ALBUTEROL SULFATE HFA 108 (90 BASE) MCG/ACT IN AERS
INHALATION_SPRAY | RESPIRATORY_TRACT | Status: AC
  Filled 2015-08-02: qty 6.7

## 2015-08-02 MED ORDER — ONDANSETRON HCL 4 MG/2ML IJ SOLN
INTRAMUSCULAR | Status: DC | PRN
  Administered 2015-08-02 (×4): 1 mg via INTRAVENOUS

## 2015-08-02 MED ORDER — FENTANYL CITRATE (PF) 100 MCG/2ML IJ SOLN
INTRAMUSCULAR | Status: DC | PRN
  Administered 2015-08-02 (×2): 50 ug via INTRAVENOUS

## 2015-08-02 MED ORDER — EPHEDRINE SULFATE 50 MG/ML IJ SOLN
INTRAMUSCULAR | Status: DC | PRN
  Administered 2015-08-02 (×2): 5 mg via INTRAVENOUS

## 2015-08-02 MED FILL — SULFAMETHOXAZOLE/TMP DS TAB: 800-160 | 5 days supply | Qty: 10 | Fill #0

## 2015-08-02 SURGICAL SUPPLY — 22 items
BAG URO CATCHER STRL LF (MISCELLANEOUS) ×2 IMPLANT
BASKET ZERO TIP NITINOL 2.4FR (BASKET) ×2 IMPLANT
CATH INTERMIT  6FR 70CM (CATHETERS) ×2 IMPLANT
CATH URET 5FR 28IN CONE TIP (BALLOONS)
CATH URET 5FR 70CM CONE TIP (BALLOONS) IMPLANT
CATH URET WHISTLE 6FR (CATHETERS) IMPLANT
CLOTH BEACON ORANGE TIMEOUT ST (SAFETY) ×2 IMPLANT
FIBER LASER FLEXIVA 1000 (UROLOGICAL SUPPLIES) IMPLANT
FIBER LASER FLEXIVA 365 (UROLOGICAL SUPPLIES) IMPLANT
FIBER LASER FLEXIVA 550 (UROLOGICAL SUPPLIES) IMPLANT
FIBER LASER TRAC TIP (UROLOGICAL SUPPLIES) ×2 IMPLANT
GLOVE BIO SURGEON STRL SZ7.5 (GLOVE) ×2 IMPLANT
GOWN STRL REUS W/TWL XL LVL3 (GOWN DISPOSABLE) ×2 IMPLANT
GUIDEWIRE ANG ZIPWIRE 038X150 (WIRE) ×2 IMPLANT
GUIDEWIRE STR DUAL SENSOR (WIRE) ×2 IMPLANT
MANIFOLD NEPTUNE II (INSTRUMENTS) ×2 IMPLANT
PACK CYSTO (CUSTOM PROCEDURE TRAY) ×2 IMPLANT
SHEATH ACCESS URETERAL 24CM (SHEATH) IMPLANT
SHEATH ACCESS URETERAL 38CM (SHEATH) ×2 IMPLANT
STENT URET 6FRX24 CONTOUR (STENTS) ×2 IMPLANT
TUBING CONNECTING 10 (TUBING) ×2 IMPLANT
WIRE COONS/BENSON .038X145CM (WIRE) IMPLANT

## 2015-08-02 NOTE — Interval H&P Note (Signed)
History and Physical Interval Note:  08/02/2015 9:24 AM  Alexis Wheeler  has presented today for surgery, with the diagnosis of LEFT KIDNEY STONE  The various methods of treatment have been discussed with the patient and family. After consideration of risks, benefits and other options for treatment, the patient has consented to  Procedure(s): LEFT URETEROSCOPY, STONE BASKETRY  (Left) HOLMIUM LASER APPLICATION (N/A) and stent placment as a surgical intervention .  The patient's history has been reviewed, patient examined, no change in status, stable for surgery.  I have reviewed the patient's chart and labs. Discussed given stone size and location as well as LLP stone pt will almost certainly need a staged procedure -- possibly 2-3 procedures. Questions were answered to the patient's satisfaction. She elects to proceed.      Greydon Betke

## 2015-08-02 NOTE — Anesthesia Preprocedure Evaluation (Addendum)
Anesthesia Evaluation  Patient identified by MRN, date of birth, ID band Patient awake    Reviewed: Allergy & Precautions, NPO status , Patient's Chart, lab work & pertinent test resultsPreop documentation limited or incomplete due to emergent nature of procedure.  History of Anesthesia Complications Negative for: history of anesthetic complications  Airway Mallampati: II  TM Distance: >3 FB Neck ROM: Full    Dental  (+) Partial Lower, Edentulous Upper, Dental Advisory Given   Pulmonary sleep apnea ,           Cardiovascular hypertension, Pt. on medications + Peripheral Vascular Disease  + Valvular Problems/Murmurs AI  Rhythm:Regular Rate:Normal  2017 Cardiac catheterization   Narrative:    Ost RCA to Prox RCA lesion, 30% stenosed.  Mid LAD lesion, 20% stenosed.  The left ventricular systolic function is normal.  1. Mild nonobstructive CAD 2. Normal LV function.  Plan: medical management.      Neuro/Psych  Neuromuscular disease    GI/Hepatic GERD  ,  Endo/Other  Morbid obesity  Renal/GU Renal InsufficiencyRenal disease     Musculoskeletal  (+) Arthritis ,   Abdominal   Peds  Hematology   Anesthesia Other Findings Dissecting Aortic Aneurysm Type 1 w/ AI  Reproductive/Obstetrics                          Anesthesia Physical  Anesthesia Plan  ASA: III and emergent  Anesthesia Plan: General   Post-op Pain Management:    Induction: Intravenous  Airway Management Planned: LMA  Additional Equipment:   Intra-op Plan:   Post-operative Plan: Extubation in OR  Informed Consent: I have reviewed the patients History and Physical, chart, labs and discussed the procedure including the risks, benefits and alternatives for the proposed anesthesia with the patient or authorized representative who has indicated his/her understanding and acceptance.   Dental advisory given  Plan  Discussed with: Anesthesiologist and CRNA  Anesthesia Plan Comments:      Anesthesia Quick Evaluation

## 2015-08-02 NOTE — Transfer of Care (Signed)
Immediate Anesthesia Transfer of Care Note  Patient: Alexis Wheeler  Procedure(s) Performed: Procedure(s):  STONE BASKETRY  (Left) HOLMIUM LASER APPLICATION (Left) CYSTOSCOPY WITH LEFT RETROGRADE PYELOGRAM (Left) CYSTOSCOPY/URETEROSCOPY/HOLMIUM LASER/STENT PLACEMENT-LEFT (Left)  Patient Location: PACU  Anesthesia Type:General  Level of Consciousness: awake, patient cooperative, lethargic and responds to stimulation  Airway & Oxygen Therapy: Patient Spontanous Breathing and Patient connected to face mask oxygen  Post-op Assessment: Report given to RN, Post -op Vital signs reviewed and stable and Patient moving all extremities  Post vital signs: Reviewed and stable  Last Vitals:  Filed Vitals:   08/02/15 0705 08/02/15 1112  BP: 157/74 119/63  Pulse: 52   Temp: 36.6 C   Resp: 18     Last Pain: There were no vitals filed for this visit.    Patients Stated Pain Goal: 3 ([id number])  Complications: No apparent anesthesia complications

## 2015-08-02 NOTE — H&P (Addendum)
Office Visit Report     07/24/2015   --------------------------------------------------------------------------------   [name]  MRN: [id number]  PRIMARY CARE:    DOB: 1955/02/07, 60 year old Female  REFERRING:  Fransico Him, MD  SSN: -[id number]  PROVIDER:  Nicolette Bang, M.D.    SUPERVISING:  Festus Aloe, M.D.    TREATING:  Virginia Crews    LOCATION:  [facility] [phone]   --------------------------------------------------------------------------------   CC: I have kidney stones.  HPI: Alexis Wheeler is a 60 year-old female patient who was referred by Dr. Fransico Him, MD who is here for renal calculi.  The problem is on the left side. This is not her first kidney stone. She has had 2 stones prior to getting this one. She is currently having flank pain. She denies having back pain, groin pain, nausea, vomiting, fever, and chills. She has not caught a stone in her urine strainer since her symptoms began.   She has never had surgical treatment for calculi in the past.   She presents today for discussion of her left renal stone. She was actually seen in the ED in 2012 by Dr. Junious Silk for a 52mm x40mm left UPJ stone with small lower pole stone and elected for left ESWL at that time. Also had a stone prior to this in 2003. She recently started having left back pain and had a renal ultrasound performed which showed a left 1.6cm renal pelvis stone and a 8mm lower pole stone.   On review of her CT in February she does have a 1.5cm x1cm left UPJ stone with a tiny lower pole stone.   Of note her history is significant for an aortic dissection in Feb 2016 requiring surgery and graft placement. Also had exertional fatigue evaluated with Myoview and found to be high risk but cardiac cath in April 2017 showed only mild CAD.     ALLERGIES: Motrin    MEDICATIONS: Lipitor 1 PO Daily  Amlodipine Besilate  Atenolol  Folic Acid  Multiple Vitamin  Vitamin  E     GU PSH: Renal ESWL - 2012      [place] Notes: Lithotripsy , Heart surgery in 2016   NON-GU PSH: None   GU PMH: Calculus Ureter, Calculus of ureter - 2014      PMH Notes: Hx of Heart attack in 2016   NON-GU PMH: Encounter for general adult medical examination without abnormal findings, Encounter for preventive health examination Essential (primary) hypertension    FAMILY HISTORY: Breast Cancer - Mother Death - Father, Mother Essential Hypertension - Mother   SOCIAL HISTORY: Marital Status: Divorced Current Smoking Status: Patient has never smoked.  Has never drank.  Drinks 1 caffeinated drink per day. Patient's occupation is/was Disabled.    REVIEW OF SYSTEMS:    GU Review Female:   Patient denies frequent urination, hard to postpone urination, burning /pain with urination, get up at night to urinate, leakage of urine, stream starts and stops, trouble starting your stream, have to strain to urinate, and currently pregnant.  Gastrointestinal (Upper):   Patient denies nausea, vomiting, and indigestion/ heartburn.  Gastrointestinal (Lower):   Patient reports diarrhea. Patient denies constipation.  Constitutional:   Patient reports fatigue. Patient denies fever, night sweats, and weight loss.  Skin:   Patient denies skin rash/ lesion and itching.  Eyes:   Patient denies blurred vision and double vision.  Ears/ Nose/ Throat:   Patient denies sore throat and sinus problems.  Hematologic/Lymphatic:  Patient denies swollen glands and easy bruising.  Cardiovascular:   Patient denies leg swelling and chest pains.  Respiratory:   Patient denies cough and shortness of breath.  Endocrine:   Patient denies excessive thirst.  Musculoskeletal:   Patient denies back pain and joint pain.  Neurological:   Patient reports headaches. Patient denies dizziness.  Psychologic:   Patient denies depression and anxiety.   VITAL SIGNS:      07/24/2015 01:05 PM  Weight 180 lb / 81.65 kg  Height  63 in / 160.02 cm  BP 151/88 mmHg  Pulse 58 /min  Temperature 98.3 F / 37 C  BMI 31.9 kg/m   MULTI-SYSTEM PHYSICAL EXAMINATION:    Constitutional: Well-nourished. No physical deformities. Normally developed. Good grooming.  Neck: Neck symmetrical, not swollen. Normal tracheal position.  Respiratory: No labored breathing, no use of accessory muscles.   Cardiovascular: Normal temperature, normal extremity pulses, no swelling, no varicosities.  Lymphatic: No enlargement of neck, axillae, groin.  Skin: No paleness, no jaundice, no cyanosis. No lesion, no ulcer, no rash.  Neurologic / Psychiatric: Oriented to time, oriented to place, oriented to person. No depression, no anxiety, no agitation.  Gastrointestinal: No mass, no tenderness, no rigidity, non obese abdomen. No CVA tenderness bilaterally.  Eyes: Normal conjunctivae. Normal eyelids.  Ears, Nose, Mouth, and Throat: Left ear no scars, no lesions, no masses. Right ear no scars, no lesions, no masses. Nose no scars, no lesions, no masses. Normal hearing. Normal lips.     PAST DATA REVIEWED:  Source Of History:  Patient  Lab Test Review:   Creatinine  Records Review:   Previous Patient Records  Urine Test Review:   Urinalysis  X-Ray Review: Renal Ultrasound: Reviewed Films. Reviewed Report. Discussed With Patient.  C.T. Abdomen/Pelvis: Reviewed Films. Discussed With Patient. Showed 1cm x1.5cm left UPJ stone with tiny left lower pole stone. 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    PROCEDURES: None   ASSESSMENT:      ICD-10 Details  1 GU:   Kidney Stone - N20.0           Notes:   60 year old female with 1.6cm renal pelvis stone with minimal left hydronephrosis. Given the size of her stone we discussed intervention to prevent further growth or dilation of the left renal collecting system. We discussed the options of ESWL, ureteroscopy, or PCNL. After discussion of  risks and benefits, she has elected to proceed with left ureteroscopic stone extraction. This is reasonable given lack of success with past ESWL. She understands the need for a stent post-operatively and possibility of a staged procedure.    PLAN:           Orders Labs Urine Culture and Sensitivity, Urinalysis          Schedule Procedure: Approximately 1 Week at [facility] or [facility] Available - Cysto Uretero Lithotripsy - [phone], left          Document Letter(s):  Created for Patient: Clinical Summary         Notes:   - We will schedule her for left ureteroscopic stone extraction  - Urine to be sent for culture today prior to procedure  - Advised her to avoid any aspirin or NSAIDS for one week prior to procedure   Dr. Junious Silk was the supervising physician for this visit.    * Signed by Virginia Crews on 07/28/15 at 10:41  AM (EDT)*       APPENDED NOTES:  Discussed with Dr. Raynald Kemp and agree with his assessment and plan.     * Signed by Festus Aloe, M.D. on 07/30/15 at 5:29 PM (EDT)*       The information contained in this medical record document is considered private and confidential patient information. This information can only be used for the medical diagnosis and/or medical services that are being provided by the patient's selected caregivers. This information can only be distributed outside of the patient's care if the patient agrees and signs waivers of authorization for this information to be sent to an outside source or route.  Add: Urine Cx was negative.

## 2015-08-02 NOTE — Op Note (Signed)
Preoperative Diagnosis:         left renal stones   Postoperative Diagnosis:         left renal stones   Procedure(s) Performed:         1. Cystourethroscopy        2. Left ureteroscopy with laser lithotripsy and basket stone extraction       3. Left retrograde pyelograms       4. Left ureteral stent placement       5. Intraoperative fluoroscopy with interpretation less than 1 hour   Teaching Surgeon:   Festus Aloe, M.D.     Resident Surgeon:    Thomasenia Sales.   Assistants:         None.   Anesthesia:         General via endotracheal tube.     Specimens:          Renal stone for chemical analysis.     Drains:             29f x 24 double-J ureteral stent with string   Cultures:           None   Estimated Blood Loss: Minimal   IV Fluids:          See Anesthesia record   Complications:              None.   Indications for Surgery:    Alexis Wheeler  is a 60 y.o.-year-old female with a history of nephrolithiasis.  The patient was evaluated and noted with a 15 mm left renal pelvis stone and 20mm lower pole stone.  The patient presents today for ureteroscopic treatment of left renal stone. The risks and benefits of the procedure were discussed with the patient who wishes to proceed.   Operative Findings:         Normal cystourethroscopy. There was a large renal pelvis stone and a smaller lower pole stone noted.  All significantly sized stones successfully dusted into tiny fragments. The stone appeared consistent in appearance with calcium oxalate dihydrate stone. Successful placement of ureteral stent.  Radiologic interpretation of Retrograde pyelogram:  Left kidney demonstrated no significant hydronephrosis with normal appearing left ureter.  Procedure:          The patient was correctly identified in the preoperative holding area where written informed consent as well potential risks and complications were reviewed. The patient was brought back to the operative suite  where a preinduction timeout was performed. After correct information was verified, general anesthesia was induced via endotracheal tube.  The patient was then gently placed in dorsal lithotomy position.  Sequential compression devices were placed for VTE prophylaxis. The patient was prepped and draped in the usual sterile fashion and appropriate periprocedural antibiotics were administered. A second timeout was performed.   We began by inserting a 22 French rigid cystoscope per urethra with copious lubrication and normal saline irrigation running. The bladder and urethra appeared grossly normal.  We proceeded to cannulate the left ureteral orifice with a combination of a sensor wire and open-ended catheter and performed a retrograde pyelogram after wire removal.  This demonstrated  normal caliber of the ureter and renal collecting system with no evidence of hydroureteronephrosis,contrast extravasation or filling defects.  We then replaced our wire into the renal pelvis under fluoroscopic guidance. Next a 12/14 [place] ureteral access sheath was passed under fluoroscopic guidance without difficulty to the proximal ureter just distal to the UPJ and a second  glidewire was passed into the kidney. The sheath was then removed and passed over the sensor wire , leaving the glidewire as a safety wire. We then passed a flexible ureteroscope into the renal collecting system and performed pan pyeloscopy as described above.  Multiple renal stones were noted as described in the above findings and the patient was noted with few Randall plaque.  Using a combination of laser lithotripsy, all visualized stones were dusted and the patient was felt to be stone-free at the end of the procedure.  We performed retrograde ureteroscopy with no ureteral injury noted and emptied the patient's bladder, leaving our glide wire in place.   At this point, we proceeded with ureteral stent placement.  We initially placed a 6 x 24 double-J  ureteral stent with the assistance of a pusher over our sensor wire with a satisfactory stent curl proximally and distally within the bladder. We then refilled and emptied the patient's bladder to ensure all debris was removed and left the bladder empty and removed all instrumentation. The patient was awoken from anesthesia and taken to the recovery area.   Post-Op Plan/Instructions:         1)Discharge home when meets PACU criteria and voids x 1 2) patient to remove ureteral stent by pulling black strings in 5 days at home 3) return to clinic in 6 weeks with renal ultrasound beforehand.  I was present in the OR for the entire procedure and scrubbed for the key portions.

## 2015-08-02 NOTE — Discharge Instructions (Signed)

## 2015-08-02 NOTE — Anesthesia Postprocedure Evaluation (Signed)
Anesthesia Post Note  Patient: ROZITA TILMON  Procedure(s) Performed: Procedure(s) (LRB):  STONE BASKETRY  (Left) HOLMIUM LASER APPLICATION (Left) CYSTOSCOPY WITH LEFT RETROGRADE PYELOGRAM (Left) CYSTOSCOPY/URETEROSCOPY/HOLMIUM LASER/STENT PLACEMENT-LEFT (Left)  Patient location during evaluation: PACU Anesthesia Type: General Level of consciousness: sedated Pain management: pain level controlled Vital Signs Assessment: post-procedure vital signs reviewed and stable Respiratory status: spontaneous breathing and respiratory function stable Cardiovascular status: stable Anesthetic complications: no    Last Vitals:  Filed Vitals:   08/02/15 1202 08/02/15 1210  BP:  126/53  Pulse: 55   Temp: 36.8 C 36.7 C  Resp:      Last Pain: There were no vitals filed for this visit.               Viral Schramm DANIEL

## 2015-08-02 NOTE — Anesthesia Procedure Notes (Signed)
Procedure Name: LMA Insertion Date/Time: 08/02/2015 9:31 AM Performed by: Ofilia Neas Pre-anesthesia Checklist: Patient identified, Emergency Drugs available, Suction available, Patient being monitored and Timeout performed Patient Re-evaluated:Patient Re-evaluated prior to inductionOxygen Delivery Method: Circle system utilized Preoxygenation: Pre-oxygenation with 100% oxygen Intubation Type: IV induction LMA: LMA inserted LMA Size: 4.0 Number of attempts: 1 Placement Confirmation: positive ETCO2 Tube secured with: Tape Dental Injury: Teeth and Oropharynx as per pre-operative assessment

## 2015-08-23 MED FILL — ?ATENOLOL 25 MG TABLET: 25 | 30 days supply | Qty: 60 | Fill #0

## 2015-08-26 MED FILL — FOLIC ACID 1 MG TABLET: 1 | 30 days supply | Qty: 30 | Fill #6

## 2015-08-26 MED FILL — ?ATORVASTATIN 40MG TABLET: 40 | 30 days supply | Qty: 30 | Fill #5

## 2015-08-27 ENCOUNTER — Encounter: Payer: Self-pay | Admitting: Nurse Practitioner

## 2015-08-29 MED FILL — LISINOPRIL 40 MG TABLET: 40 | 30 days supply | Qty: 30 | Fill #4

## 2015-08-29 MED FILL — ?AMLODIPINE BESYLATE 10 MG: 10 | 30 days supply | Qty: 30 | Fill #4

## 2015-09-10 ENCOUNTER — Ambulatory Visit: Payer: BLUE CROSS/BLUE SHIELD | Admitting: Nurse Practitioner

## 2015-09-10 NOTE — Progress Notes (Deleted)
CARDIOLOGY OFFICE NOTE  Date:  09/10/2015    Alexis Wheeler Date of Birth: 17-Jul-1955 Medical Record [id number]  PCP:  Truitt Merle, NP  Cardiologist:  [facility]  No chief complaint on file.   History of Present Illness: Alexis Wheeler is a 60 y.o. female who presents today for a 3 month check. Seen for Dr. Radford Pax.   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 She underwent replacement of the ascending aorta with resuspension of the AV with 30 mm Hemashield Platinum graft with R axillary artery cannulation by Dr. Servando Snare. EF was 40-45% post op. Follow up echo from 07/2014 showed improvement of her EF - now at 55 to 60%.   Admitted back in December of 2016 with uncontrolled HTN. Medicines were all changed. She had stopped some.  She was referred back here for management of BP. Did not have PCP. She was placed on spironolactone and changed her atenolol to carvedilol.   I have seen her back several times since then - BP still too high. Did not tolerate Hydralazine. Complained of what sounded like exertional fatigue - she was not cathed prior to her AAA dissection and I was worried about CAD. Myoview was obtained - turned out to be high risk - was referred for cath - this was reassuring - only with very mild CAD that is nonobstructive. I last saw her in May - she was doing well from our standpoint but have left side pain - turned out to be a kidney stone and I sent her to [facility].   Comes back today. Here alone.    Past Medical History:  Diagnosis Date  . Arthritis    right knee  . Congestive dilated cardiomyopathy ([place]) 08/01/2014  . GERD   . HEMATURIA UNSPECIFIED   . History of kidney stones   . HYPERLIPIDEMIA   . HYPERTENSION   . Morbid obesity ([place])   . MYOSITIS   . Sleep apnea    cpap-  doesnt use it- last study years ago  . Vertigo      Past Surgical History:  Procedure Laterality Date  . ABDOMINAL HYSTERECTOMY  1983  . CARDIAC CATHETERIZATION N/A 04/30/2015   Procedure: Left Heart Cath and Coronary Angiography;  Surgeon: Peter M Martinique, MD;  Location: [facility];  Service: Cardiovascular;  Laterality: N/A;  . CHOLECYSTECTOMY  2001  . CYSTOSCOPY W/ RETROGRADES Left 08/02/2015   Procedure: CYSTOSCOPY WITH LEFT RETROGRADE PYELOGRAM;  Surgeon: Festus Aloe, MD;  Location[facility];  Service: Urology;  Laterality: Left;  . CYSTOSCOPY WITH URETEROSCOPY, STONE BASKETRY AND STENT PLACEMENT Left 08/02/2015   Procedure:  Mitzi Davenport ;  Surgeon: Festus Aloe, MD;  Location[facility];  Service: Urology;  Laterality: Left;  . CYSTOSCOPY/URETEROSCOPY/HOLMIUM LASER/STENT PLACEMENT Left 08/02/2015   Procedure: CYSTOSCOPY/URETEROSCOPY/HOLMIUM LASER/STENT PLACEMENT-LEFT;  Surgeon: Festus Aloe, MD;  [facility];  Service: Urology;  Laterality: Left;  . HOLMIUM LASER APPLICATION Left [id number]   Procedure: HOLMIUM LASER APPLICATION;  Surgeon: Festus Aloe, MD;  [facility];  Service: Urology;  Laterality: Left;  . REPLACEMENT ASCENDING AORTA N/A 03/03/2014   Procedure: REPAIR OF ASCENDING AORTIC DISSECTION;  Surgeon: Grace Isaac, MD;  Location: [facility];  Service: Open Heart Surgery;  Laterality: N/A;     Medications: Current Outpatient Prescriptions  Medication Sig  Dispense Refill  . amLODipine (NORVASC) 10 MG tablet Take 1 tablet (10 mg total) by mouth daily. 30 tablet 11  . aspirin EC 81 MG EC tablet Take 1 tablet (81 mg total) by mouth daily.    [name] atenolol (TENORMIN) 50 MG tablet Take 50 mg by mouth daily.  3  . atorvastatin (LIPITOR) 40 MG tablet Take 1 tablet (40 mg total) by mouth daily at 6 PM. 30 tablet 6  . carvedilol (COREG) 6.25 MG tablet Take 1 tablet (6.25 mg total) by mouth 2 (two) times daily. 60 tablet 11  . folic acid (FOLVITE) 1 MG tablet Take 1 tablet (1 mg total) by mouth daily.     [name] HYDROcodone-acetaminophen (NORCO) 5-325 MG tablet Take 1-2 tablets by mouth every 4 (four) hours as needed for moderate pain. 15 tablet 0  . lisinopril (PRINIVIL,ZESTRIL) 40 MG tablet Take 1 tablet (40 mg total) by mouth daily. 30 tablet 11  . spironolactone (ALDACTONE) 50 MG tablet Take 1 tablet (50 mg total) by mouth daily. 30 tablet 6  . sulfamethoxazole-trimethoprim (BACTRIM DS,SEPTRA DS) 800-160 MG tablet Take 1 tablet by mouth 2 (two) times daily. 10 tablet 0  . vitamin E 400 UNIT capsule Take 400 Units by mouth daily.     No current facility-administered medications for this visit.     Allergies: Allergies  Allergen Reactions  . Hydralazine Nausea Only  . Motrin [Ibuprofen] Other (See Comments)    Upset GI (motrin brand causes the side effect)    Social History: The patient  reports that she has never smoked. She has never used smokeless tobacco. She reports that she does not drink alcohol or use drugs.   Family History: The patient's family history includes Aortic dissection (age of onset: 62) in her mother; Breast cancer in her sister; Breast cancer (age of onset: 66) in her mother; Hypertension in her daughter, father, mother, and sister; Throat cancer in her brother.   Review of Systems: Please see the history of present illness.   Otherwise, the review of systems is positive for none.   All other systems are reviewed and negative.   Physical Exam: VS:  There were no vitals taken for this visit. [name]  BMI There is no height or weight on file to calculate BMI.  Wt Readings from Last 3 Encounters:  08/02/15 183 lb (83 kg)  08/01/15 183 lb (83 kg)  06/07/15 184 lb 12 oz (83.8 kg)    General: Pleasant. Well developed, well nourished and in no acute distress.   HEENT: Normal.  Neck: Supple, no JVD, carotid bruits, or masses noted.  Cardiac: Regular rate and rhythm. No murmurs, rubs, or gallops. No edema.  Respiratory:  Lungs are clear to auscultation bilaterally with  normal work of breathing.  GI: Soft and nontender.  MS: No deformity or atrophy. Gait and ROM intact.  Skin: Warm and dry. Color is normal.  Neuro:  Strength and sensation are intact and no gross focal deficits noted.  Psych: Alert, appropriate and with normal affect.   LABORATORY DATA:  EKG:  EKG is not ordered today.  Lab Results  Component Value Date   WBC 6.6 08/01/2015   HGB 12.5 08/01/2015   HCT 40.4 08/01/2015   PLT 224 08/01/2015   GLUCOSE 91 08/01/2015   CHOL 87 03/06/2014   TRIG 108 03/06/2014   HDL 27 (L) 03/06/2014   LDLCALC 38 03/06/2014   ALT 20 03/02/2014   AST 31 03/02/2014   NA  140 08/01/2015   K 4.4 08/01/2015   CL 108 08/01/2015   CREATININE 1.29 (H) 08/01/2015   BUN 16 08/01/2015   CO2 26 08/01/2015   TSH 0.78 03/08/2015   INR 0.94 04/26/2015   HGBA1C 5.8 (H) 03/07/2014    BNP (last 3 results) No results for input(s): BNP in the last 8760 hours.  ProBNP (last 3 results) No results for input(s): PROBNP in the last 8760 hours.   Other Studies Reviewed Today:  Cardiac Cath Procedures 04/2015    Left Heart Cath and Coronary Angiography    Conclusion     Ost RCA to Prox RCA lesion, 30% stenosed.  Mid LAD lesion, 20% stenosed.  The left ventricular systolic function is normal.  1. Mild nonobstructive CAD 2. Normal LV function.  Plan: medical management.         Myoview Study Highlights 04/2015    The left ventricular ejection fraction is moderately decreased (30-44%).  Nuclear stress EF: 42%.  Defect 1: There is a large defect of moderate severity.  Findings consistent with ischemia.  No T wave inversion was noted during stress.  There was no ST segment deviation noted during stress.  Large size, moderate severity reversible (SDS 9) inferolateral perfusion defect suggestive of ischemia. LVEF 42% with inferolateral hypokinesis. This is a high risk study based on the extent of ischemia.  Echo Study Conclusions  from 07/2014  - Left ventricle: The cavity size was normal. There was mild focal  basal hypertrophy of the septum. Systolic function was normal.  The estimated ejection fraction was in the range of 55% to 60%.  Wall motion was normal; there were no regional wall motion  abnormalities. There was an increased relative contribution of  atrial contraction to ventricular filling. Doppler parameters are  consistent with abnormal left ventricular relaxation (grade 1  diastolic dysfunction). - Aortic valve: Trileaflet; normal thickness, mildly calcified  leaflets. There was trivial regurgitation. - Aorta: Aortic root dimension: 42 mm (ED). - Aortic root: The aortic root was mildly dilated. - Mitral valve: There was trivial regurgitation.  Assessment/Plan:  1. HTN - BP recheck by me is 140/80. She has had considerable improvement and is now compliant with her current regimen. [place] lab today. See back in 3 months.   2. HLD - on statin - she needs labs on return.   3. Prior aortic dissection repair - to see Dr. Servando Snare next year in February.  4. Exertional Fatigue - had high risk Myoview - but with reassuring cardiac cath findings.   5. Iron deficiency anemia - back on Folic acid at last visit  6. Prior dilated CM - last echo showed EF had recovered but was lower by recent Myoview - yet normal at time of cardiac cath - she looks good clinically and I have left her on her current regimen.  7. Left flank pain - will send her for a renal ultrasound. May end up needing CT. Will check UA as well.         Current medicines are reviewed with the patient today.  The patient does not have concerns regarding medicines other than what has been noted above.  The following changes have been made:  See above.  Labs/ tests ordered today include:   No orders of the defined types were placed in this encounter.    Disposition:   FU with me in 4 months.   Patient is agreeable to  this plan and will call if any problems develop  in the interim.   Signed: Burtis Junes, RN, ANP-C 09/10/2015 2:09 PM  [facility] [address] [place], [place]  [address] Phone: [phone] Fax: [phone]

## 2015-09-11 ENCOUNTER — Encounter: Payer: Self-pay | Admitting: Nurse Practitioner

## 2015-10-08 ENCOUNTER — Other Ambulatory Visit: Payer: Self-pay | Admitting: Physician Assistant

## 2015-10-08 MED FILL — ATORVASTATIN 40 MG TABLET: 40 | 30 days supply | Qty: 30 | Fill #6

## 2015-10-08 MED FILL — LISINOPRIL 40 MG TABLET: 40 | 30 days supply | Qty: 30 | Fill #5

## 2015-10-08 MED FILL — AMLODIPINE BESYLATE 10 MG T: 10 | 30 days supply | Qty: 30 | Fill #5

## 2015-10-21 ENCOUNTER — Other Ambulatory Visit: Payer: Self-pay | Admitting: Physician Assistant

## 2015-10-21 MED FILL — FOLIC ACID 1 MG TABLET: 1 | 30 days supply | Qty: 30 | Fill #0

## 2015-10-23 ENCOUNTER — Other Ambulatory Visit: Payer: Self-pay | Admitting: Internal Medicine

## 2015-11-14 MED FILL — ?AMLODIPINE BESYLATE 10 MG: 10 | 30 days supply | Qty: 30 | Fill #6

## 2015-11-14 MED FILL — LISINOPRIL 40 MG TABLET: 40 | 30 days supply | Qty: 30 | Fill #6

## 2015-11-18 ENCOUNTER — Other Ambulatory Visit: Payer: Self-pay | Admitting: Physician Assistant

## 2015-11-18 MED FILL — FOLIC ACID 1 MG TABLET: 1 | 30 days supply | Qty: 30 | Fill #1

## 2015-12-03 ENCOUNTER — Ambulatory Visit: Payer: BLUE CROSS/BLUE SHIELD | Admitting: Family Medicine

## 2015-12-10 ENCOUNTER — Other Ambulatory Visit: Payer: Self-pay | Admitting: Thoracic Surgery (Cardiothoracic Vascular Surgery)

## 2015-12-10 ENCOUNTER — Other Ambulatory Visit: Payer: Self-pay | Admitting: Physician Assistant

## 2015-12-11 MED FILL — ATENOLOL 50 MG TABLET: 50 | 30 days supply | Qty: 60 | Fill #0

## 2015-12-12 NOTE — Progress Notes (Signed)
[facility] [address] [place], [place] [address] Phone: [phone] Subjective:    I'm seeing this patient by the request  of:  Truitt Merle, NP   CC: Heel pain  [id number]  Alexis Wheeler is a 60 y.o. female coming in with complaint of heel pain. Right-sided. Patient is had this in the past. Has been diagnosed with plantar fasciitis previously. Patient had been doing very well but is not having worsening pain. Was seen by another provider previously and was given an injection in the foot. Patient states that that seem to be helpful. States though that it is starting to hurt her hip again. Only on the lateral aspect. Denies any new groin pain. Affecting some of the daily activities. Pain is worse after sitting for a while and does seem to get somewhat better with activity. Has noticed that certain shoes make improvement.     Past Medical History:  Diagnosis Date  . Arthritis    right knee  . Congestive dilated cardiomyopathy ([place]) 08/01/2014  . GERD   . HEMATURIA UNSPECIFIED   . History of kidney stones   . HYPERLIPIDEMIA   . HYPERTENSION   . Morbid obesity ([place])   . MYOSITIS   . Sleep apnea    cpap-  doesnt use it- last study years ago  . Vertigo    Past Surgical History:  Procedure Laterality Date  . ABDOMINAL HYSTERECTOMY  1983  . CARDIAC CATHETERIZATION N/A 04/30/2015   Procedure: Left Heart Cath and Coronary Angiography;  Surgeon: Peter M Martinique, MD;  Location: [facility];  Service: Cardiovascular;  Laterality: N/A;  . CHOLECYSTECTOMY  2001  . CYSTOSCOPY W/ RETROGRADES Left 08/02/2015   Procedure: CYSTOSCOPY WITH LEFT RETROGRADE PYELOGRAM;  Surgeon: Festus Aloe, MD;  [facility];  Service: Urology;  Laterality: Left;  . CYSTOSCOPY WITH URETEROSCOPY, STONE BASKETRY AND STENT PLACEMENT Left 08/02/2015   Procedure:  Mitzi Davenport ;  Surgeon: Festus Aloe, MD;  [facility];  Service: Urology;  Laterality:  Left;  . CYSTOSCOPY/URETEROSCOPY/HOLMIUM LASER/STENT PLACEMENT Left 08/02/2015   Procedure: CYSTOSCOPY/URETEROSCOPY/HOLMIUM LASER/STENT PLACEMENT-LEFT;  Surgeon: Festus Aloe, MD;  [facility];  Service: Urology;  Laterality: Left;  . HOLMIUM LASER APPLICATION Left [id number]   Procedure: HOLMIUM LASER APPLICATION;  Surgeon: Festus Aloe, MD;  [facility];  Service: Urology;  Laterality: Left;  . REPLACEMENT ASCENDING AORTA N/A 03/03/2014   Procedure: REPAIR OF ASCENDING AORTIC DISSECTION;  Surgeon: Grace Isaac, MD;  Location: [facility];  Service: Open Heart Surgery;  Laterality: N/A;   Social History   Social History  . Marital status: Divorced    Spouse name: N/A  . Number of children: N/A  . Years of education: N/A   Social History Main Topics  . Smoking status: Never Smoker  . 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 Alcohol use No  . Drug use: No  . Sexual activity: No   Other Topics Concern  . None   Social History Narrative  . None   Allergies  Allergen Reactions  . Hydralazine Nausea Only  . Motrin [Ibuprofen] Other (See Comments)    Upset GI (motrin brand causes the side effect)   Family History  Problem Relation Age of Onset  . Breast cancer Mother 69  . Aortic dissection Mother 82  . Hypertension Mother   .  Throat cancer Brother     1 bro living  . Hypertension Father   . Breast cancer Sister   . Hypertension Sister   . Hypertension Daughter   . Seizures Neg Hx     Past medical history, social, surgical and family history all reviewed in electronic medical record.  No pertanent information unless stated regarding to the chief complaint.   Review of Systems:Review of systems updated and as accurate as of 12/13/15  No headache, visual changes, nausea, vomiting, diarrhea, constipation, dizziness, abdominal pain, skin rash, fevers, chills,  night sweats, weight loss, swollen lymph nodes chest pain, shortness of breath, mood changes.   Objective  Blood pressure (!) 158/92, pulse 72, height 5\' 3"  (1.6 m), weight 192 lb (87.1 kg). Systems examined below as of 12/13/15   General: No apparent distress alert and oriented x3 mood and affect normal, dressed appropriately. Obese HEENT: Pupils equal, extraocular movements intact  Respiratory: Patient's speak in full sentences and does not appear short of breath  Cardiovascular: No lower extremity edema, non tender, no erythema  Skin: Warm dry intact with no signs of infection or rash on extremities or on axial skeleton.  Abdomen: Soft nontender  Neuro: Cranial nerves II through XII are intact, neurovascularly intact in all extremities with 2+ DTRs and 2+ pulses.  Lymph: No lymphadenopathy of posterior or anterior cervical chain or axillae bilaterally.  Gait mild antalgic gait.  MSK:  Non tender with full range of motion and good stability and symmetric strength and tone of shoulders, elbows, wrist, hip, knee and ankles bilaterally. Mild to moderate arthritic changes of multiple joints0. foot exam shows the patient does have pes planus with mild marrow foot. Patient does have overpronation of the hindfoot. Mild breakdown of the transverse arch right greater than left. Mild tenderness to palpation over the medial calcaneal region on the right foot.  Limited muscular skeletal ultrasound was performed and interpreted by Lyndal Pulley  Limited ultrasound patient's plantar aspect of the foot shows the patient's plantar fascial is 1.08 cm in width. Patient does have hypoechoic changes as well as degenerative changes of the plantar fascia itself. Mild heel spur noted.   impression: Chronic scarring of the plantar fascia but no new inflammation.  Procedure note [id number]; 15 minutes spent for Therapeutic exercises as stated in above notes.  This included exercises focusing on stretching,  strengthening, with significant focus on eccentric aspects. Exercises for the foot include:  Stretches to help lengthen the lower leg and plantar fascia areas Theraband exercises for the lower leg and ankle to help strengthen the surrounding area- dorsiflexion, plantarflexion, inversion, eversion Massage rolling on the plantar surface of the foot with a frozen bottle, tennis ball or golf ball Towel or marble pick-ups to strengthen the plantar surface of the foot Weight bearing exercises to increase balance and overall stability    Proper technique shown and discussed handout in great detail with ATC.  All questions were discussed and answered.        Impression and Recommendations:     This case required medical decision making of moderate complexity.      Note: This dictation was prepared with Dragon dictation along with smaller phrase technology. Any transcriptional errors that result from this process are unintentional.

## 2015-12-13 ENCOUNTER — Ambulatory Visit (INDEPENDENT_AMBULATORY_CARE_PROVIDER_SITE_OTHER): Payer: Medicaid Other | Admitting: Family Medicine

## 2015-12-13 ENCOUNTER — Encounter: Payer: Self-pay | Admitting: Family Medicine

## 2015-12-13 VITALS — BP 158/92 | HR 72 | Ht 63.0 in | Wt 192.0 lb

## 2015-12-13 DIAGNOSIS — G8929 Other chronic pain: Secondary | ICD-10-CM | POA: Diagnosis not present

## 2015-12-13 DIAGNOSIS — M722 Plantar fascial fibromatosis: Secondary | ICD-10-CM

## 2015-12-13 DIAGNOSIS — M79671 Pain in right foot: Secondary | ICD-10-CM | POA: Diagnosis not present

## 2015-12-13 NOTE — Assessment & Plan Note (Signed)
Worsening problem. Patient work with Product/process development scientist for an additional time today. This opened will be beneficial and she will do the home exercises. Patient given trial of topical anti-inflammatories and warned of potential side effects. We will avoid oral anti-inflammatories secondary to patient's heart attack. Encourage patient to lose weight. Discussed proper shoes and over-the-counter orthotics. We discussed icing regimen. Follow-up again in 4-6 weeks. Continuing have pain we'll consider formal physical therapy or injection.

## 2015-12-13 NOTE — Patient Instructions (Signed)
Good to see you  Alexis Wheeler is your friend. Ice bath 10 minutes at night  pennsaid pinkie amount topically 2 times daily as needed.  Can use on hip as well. If easy bruising or stomach pain then stop Rigid sole shoes.  Spenco orthotics "total support" online would be great  Stay active though  Exercises 3 times a week.  See me again in 4-6 weeks!

## 2015-12-13 NOTE — Assessment & Plan Note (Signed)
Encourage weight loss. 

## 2015-12-24 ENCOUNTER — Ambulatory Visit (INDEPENDENT_AMBULATORY_CARE_PROVIDER_SITE_OTHER): Payer: Medicaid Other | Admitting: Nurse Practitioner

## 2015-12-24 ENCOUNTER — Encounter: Payer: Self-pay | Admitting: Nurse Practitioner

## 2015-12-24 VITALS — BP 140/66 | HR 66 | Ht 64.0 in | Wt 195.1 lb

## 2015-12-24 DIAGNOSIS — I10 Essential (primary) hypertension: Secondary | ICD-10-CM | POA: Diagnosis not present

## 2015-12-24 DIAGNOSIS — R5383 Other fatigue: Secondary | ICD-10-CM

## 2015-12-24 DIAGNOSIS — Z9889 Other specified postprocedural states: Secondary | ICD-10-CM | POA: Diagnosis not present

## 2015-12-24 LAB — HEPATIC FUNCTION PANEL
ALT: 12 U/L (ref 6–29)
AST: 12 U/L (ref 10–35)
Albumin: 4 g/dL (ref 3.6–5.1)
Alkaline Phosphatase: 77 U/L (ref 33–130)
Bilirubin, Direct: 0.1 mg/dL (ref <=0.2)
Indirect Bilirubin: 0.3 mg/dL (ref 0.2–1.2)
Total Bilirubin: 0.4 mg/dL (ref 0.2–1.2)
Total Protein: 6.3 g/dL (ref 6.1–8.1)

## 2015-12-24 LAB — BASIC METABOLIC PANEL
BUN: 16 mg/dL (ref 7–25)
CO2: 28 mmol/L (ref 20–31)
Calcium: 9.1 mg/dL (ref 8.6–10.4)
Chloride: 107 mmol/L (ref 98–110)
Creat: 1.24 mg/dL — ABNORMAL HIGH (ref 0.50–0.99)
Glucose, Bld: 82 mg/dL (ref 65–99)
Potassium: 3.6 mmol/L (ref 3.5–5.3)
Sodium: 142 mmol/L (ref 135–146)

## 2015-12-24 LAB — CBC
HCT: 39.3 % (ref 35.0–45.0)
Hemoglobin: 12.6 g/dL (ref 11.7–15.5)
MCH: 23.5 pg — ABNORMAL LOW (ref 27.0–33.0)
MCHC: 32.1 g/dL (ref 32.0–36.0)
MCV: 73.3 fL — ABNORMAL LOW (ref 80.0–100.0)
MPV: 10.2 fL (ref 7.5–12.5)
Platelets: 218 10*3/uL (ref 140–400)
RBC: 5.36 MIL/uL — ABNORMAL HIGH (ref 3.80–5.10)
RDW: 15.8 % — ABNORMAL HIGH (ref 11.0–15.0)
WBC: 5.6 10*3/uL (ref 3.8–10.8)

## 2015-12-24 LAB — LIPID PANEL
Cholesterol: 155 mg/dL (ref <200)
HDL: 53 mg/dL (ref >50)
LDL Cholesterol: 75 mg/dL (ref <100)
Total CHOL/HDL Ratio: 2.9 Ratio (ref <5.0)
Triglycerides: 133 mg/dL (ref <150)
VLDL: 27 mg/dL (ref <30)

## 2015-12-24 MED ORDER — CARVEDILOL 12.5 MG PO TABS
12.5000 mg | ORAL_TABLET | Freq: Two times a day (BID) | ORAL | 3 refills | Status: DC
Start: 2015-12-24 — End: 2016-07-14

## 2015-12-24 MED ORDER — ATORVASTATIN CALCIUM 40 MG PO TABS: 40.0000 mg | ORAL_TABLET | Freq: Every day | ORAL | 3 refills | Status: DC

## 2015-12-24 NOTE — Patient Instructions (Addendum)
We will be checking the following labs today - BMET, CBC, HPF and Lipids   Medication Instructions:    Continue with your current medicines. BUT go by this current list.  I am increasing the Coreg to 12.5 mg to take twice a day - this has been sent to [facility]    Testing/Procedures To Be Arranged:  N/A  Follow-Up:   See me in about 6 weeks.     Other Special Instructions:   I want you to try and be a little more active - walking is good!    If you need a refill on your cardiac medications before your next appointment, please call your pharmacy.   Call the [place] office at [phone] if you have any questions, problems or concerns.

## 2015-12-24 NOTE — Progress Notes (Signed)
CARDIOLOGY OFFICE NOTE  Date:  12/24/2015    Alexis Wheeler Date of Birth: Sep 13, 1955 Medical Record [id number]  PCP:  Truitt Merle, NP  Cardiologist:  Rosanne Sack    Chief Complaint  Patient presents with  . Hypertension    Follow up visit - seen for Dr. Radford Pax    History of Present Illness: Alexis Wheeler is a 60 y.o. female who presents today for a 7 month check. Seen for Dr. Radford Pax.   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 She underwent replacement of the ascending aorta with resuspension of the AV with 30 mm Hemashield Platinum graft with R axillary artery cannulation by Dr. Servando Snare. EF was 40-45% post op. Follow up echo from 07/2014 showed improvement of her EF - now at 55 to 60%.   Admitted back in December with uncontrolled HTN. Medicines were all changed. She had stopped some.  She was referred back here for management of BP. Did not have PCP. She was placed on spironolactone and changed her atenolol to carvedilol.   I then saw her back several times - BP still too high. Did not tolerate Hydralazine. Complained of what sounded like exertional fatigue - she was not cathed prior to her AAA dissection and I was worried about CAD. Myoview was obtained - turned out to be high risk - was referred for cath - this was reassuring - only with very mild CAD that is nonobstructive. Last seen by me back in May - cardiac status ok. Was having what sounded like pain from kidney stones. Ended up seeing GU.   Comes back today. Here alone. Seems to be doing well. 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 Trying to be more active.  Her food stamps have been cut to 16 dollars a month. She has gone back to Medicaid - can't afford the [employer] premium (over 1000 dollars). Has seen Dr. Charlann Boxer for some heel pain. Can't go back there due to being on  Medicaid. BP seems to be up. Not really clear about her medicines. For some reason she is on 2 beta blockers. Not clear to me if she is taking both. Needs labs today. No chest pain. Overall, she feels like she is doing ok.   Past Medical History:  Diagnosis Date  . Arthritis    right knee  . Congestive dilated cardiomyopathy ([place]) 08/01/2014  . GERD   . HEMATURIA UNSPECIFIED   . History of kidney stones   . HYPERLIPIDEMIA   . HYPERTENSION   . Morbid obesity ([place])   . MYOSITIS   . Sleep apnea    cpap-  doesnt use it- last study years ago  . Vertigo     Past Surgical History:  Procedure Laterality Date  . ABDOMINAL HYSTERECTOMY  1983  . CARDIAC CATHETERIZATION N/A 04/30/2015   Procedure: Left Heart Cath and Coronary Angiography;  Surgeon: Peter M Martinique, MD;  Location: [facility];  Service: Cardiovascular;  Laterality: N/A;  . CHOLECYSTECTOMY  2001  . CYSTOSCOPY W/ RETROGRADES Left 08/02/2015   Procedure: CYSTOSCOPY WITH LEFT RETROGRADE PYELOGRAM;  Surgeon: Festus Aloe, MD;  Location: [facility];  Service: Urology;  Laterality: Left;  . CYSTOSCOPY WITH URETEROSCOPY, STONE BASKETRY AND STENT PLACEMENT Left 08/02/2015   Procedure:  Mitzi Davenport ;  Surgeon: Festus Aloe, MD;  [facility];  Service: Urology;  Laterality: Left;  . CYSTOSCOPY/URETEROSCOPY/HOLMIUM LASER/STENT PLACEMENT Left 08/02/2015   Procedure: CYSTOSCOPY/URETEROSCOPY/HOLMIUM LASER/STENT PLACEMENT-LEFT;  Surgeon: Festus Aloe, MD;  [facility];  Service: Urology;  Laterality: Left;  . HOLMIUM LASER APPLICATION Left [id number]   Procedure: HOLMIUM LASER APPLICATION;  Surgeon: Festus Aloe, MD;  [facility];  Service: Urology;  Laterality: Left;  . REPLACEMENT ASCENDING AORTA N/A 03/03/2014   Procedure: REPAIR OF ASCENDING AORTIC DISSECTION;  Surgeon: Grace Isaac, MD;  Location: [facility];  Service: Open Heart Surgery;  Laterality: N/A;     Medications: Current Outpatient Prescriptions    Medication Sig Dispense Refill  . amLODipine (NORVASC) 10 MG tablet Take 1 tablet (10 mg total) by mouth daily. 30 tablet 11  . aspirin EC 81 MG EC tablet Take 1 tablet (81 mg total) by mouth daily.    [name] atorvastatin (LIPITOR) 40 MG tablet Take 1 tablet (40 mg total) by mouth daily at 6 PM. 30 tablet 6  . folic acid (FOLVITE) 1 MG tablet TAKE 1 TABLET BY MOUTH DAILY. 30 tablet 6  . lisinopril (PRINIVIL,ZESTRIL) 40 MG tablet Take 1 tablet (40 mg total) by mouth daily. 30 tablet 11  . spironolactone (ALDACTONE) 50 MG tablet Take 1 tablet (50 mg total) by mouth daily. 30 tablet 6  . vitamin E 400 UNIT capsule Take 400 Units by mouth daily.    . carvedilol (COREG) 12.5 MG tablet Take 1 tablet (12.5 mg total) by mouth 2 (two) times daily. 180 tablet 3   No current facility-administered medications for this visit.     Allergies: Allergies  Allergen Reactions  . Hydralazine Nausea Only  . Motrin [Ibuprofen] Other (See Comments)    Upset GI (motrin brand causes the side effect)    Social History: The patient  reports that she has never smoked. She has never used smokeless tobacco. She reports that she does not drink alcohol or use drugs.   Family History: The patient's family history includes Aortic dissection (age of onset: 88) in her mother; Breast cancer in her sister; Breast cancer (age of onset: 56) in her mother; Hypertension in her daughter, father, mother, and sister; Throat cancer in her brother.   Review of Systems: Please see the history of present illness.   Otherwise, the review of systems is positive for none.   All other systems are reviewed and negative.   Physical Exam: VS:  BP 140/66   Pulse 66   Ht 5\' 4"  (1.626 m)   Wt 195 lb 1.9 oz (88.5 kg)   SpO2 96%   BMI 33.49 kg/m  .  BMI Body mass index is 33.49 kg/m.  Wt Readings from Last 3 Encounters:  12/24/15 195 lb 1.9 oz (88.5 kg)  12/13/15 192 lb (87.1 kg)  08/02/15 183 lb (83 kg)   BP by me is  165/80  General: Pleasant. Well developed, well nourished and in no acute distress.   HEENT: Normal.  Neck: Supple, no JVD, carotid bruits, or masses noted.  Cardiac: Regular rate and rhythm. No murmurs, rubs, or gallops. No edema.  Respiratory:  Lungs are clear to auscultation bilaterally with normal work of breathing.  GI: Soft and nontender.  MS: No deformity or atrophy. Gait and ROM intact.  Skin: Warm and dry. Color is normal.  Neuro:  Strength and sensation are intact and no gross focal deficits noted.  Psych: Alert, appropriate and with normal affect.   LABORATORY DATA:  EKG:  EKG is not ordered today.  Lab Results  Component Value Date   WBC 6.6 08/01/2015   HGB 12.5 08/01/2015   HCT 40.4 08/01/2015   PLT 224 08/01/2015   GLUCOSE 91 08/01/2015   CHOL 87 03/06/2014   TRIG 108 03/06/2014   HDL 27 (L) 03/06/2014   LDLCALC 38 03/06/2014   ALT 20 03/02/2014   AST 31 03/02/2014   NA 140 08/01/2015   K 4.4 08/01/2015   CL 108 08/01/2015   CREATININE 1.29 (H) 08/01/2015   BUN 16 08/01/2015   CO2 26 08/01/2015   TSH 0.78 03/08/2015   INR 0.94 04/26/2015   HGBA1C 5.8 (H) 03/07/2014    BNP (last 3 results) No results for input(s): BNP in the last 8760 hours.  ProBNP (last 3 results) No results for input(s): PROBNP in the last 8760 hours.   Other Studies Reviewed Today:  Cardiac Cath Procedures 04/2015    Left Heart Cath and Coronary Angiography    Conclusion     Ost RCA to Prox RCA lesion, 30% stenosed.  Mid LAD lesion, 20% stenosed.  The left ventricular systolic function is normal.  1. Mild nonobstructive CAD 2. Normal LV function.  Plan: medical management.       Myoview Study Highlights 04/2015    The left ventricular ejection fraction is moderately decreased (30-44%).  Nuclear stress EF: 42%.  Defect 1: There is a large defect of moderate severity.  Findings consistent with ischemia.  No T wave inversion was noted during  stress.  There was no ST segment deviation noted during stress.  Large size, moderate severity reversible (SDS 9) inferolateral perfusion defect suggestive of ischemia. LVEF 42% with inferolateral hypokinesis. This is a high risk study based on the extent of ischemia.    Echo Study Conclusions from 07/2014  - Left ventricle: The cavity size was normal. There was mild focal  basal hypertrophy of the septum. Systolic function was normal.  The estimated ejection fraction was in the range of 55% to 60%.  Wall motion was normal; there were no regional wall motion  abnormalities. There was an increased relative contribution of  atrial contraction to ventricular filling. Doppler parameters are  consistent with abnormal left ventricular relaxation (grade 1  diastolic dysfunction). - Aortic valve: Trileaflet; normal thickness, mildly calcified  leaflets. There was trivial regurgitation. - Aorta: Aortic root dimension: 42 mm (ED). - Aortic root: The aortic root was mildly dilated. - Mitral valve: There was trivial regurgitation.  Assessment/Plan:  1. HTN - BP recheck by me is up.  Will increase her Coreg - I think that is the easiest option for her. Lab today. Medicines are not quite clear - hopefully have gotten this sorted out for her today. Will call daughter and give update per her request.   2. HLD - on statin - she needs labs today  3. Prior aortic dissection repair - to see Dr. Servando Snare next year in February.  4. Exertional Fatigue - had high risk Myoview - but with reassuring cardiac cath findings.   5. Iron deficiency anemia - back on Folic acid at last visit - rechecking CBC today.   6. Prior dilated CM - last echo showed EF had recovered but was lower by recent Myoview - yet normal at time of cardiac cath - she looks good clinically and I have left her on her current regimen.  7. Poor social situation          Current medicines are reviewed  with the  patient today.  The patient does not have concerns regarding medicines other than what has been noted above.  The following changes have been made:  See above.  Labs/ tests ordered today include:    Orders Placed This Encounter  Procedures  . Basic metabolic panel  . CBC  . Hepatic function panel  . Lipid panel     Disposition:   FU with me in 6 weeks.    Patient is agreeable to this plan and will call if any problems develop in the interim.   Signed: Burtis Junes, RN, ANP-C 12/24/2015 10:53 AM  [facility] [address] [place], [place]  [address] Phone: [phone] Fax: [phone]

## 2016-01-08 IMAGING — CR DG CHEST 2V
2 series · 2 of 2 positions shown · non-contrast
Comparison: 03/08/2014, 03/07/2014

CLINICAL DATA: 58-year-old female with a history of chest pain.
Status post aortic dissection repair.

EXAM:
CHEST - 2 VIEW

[chest pa]
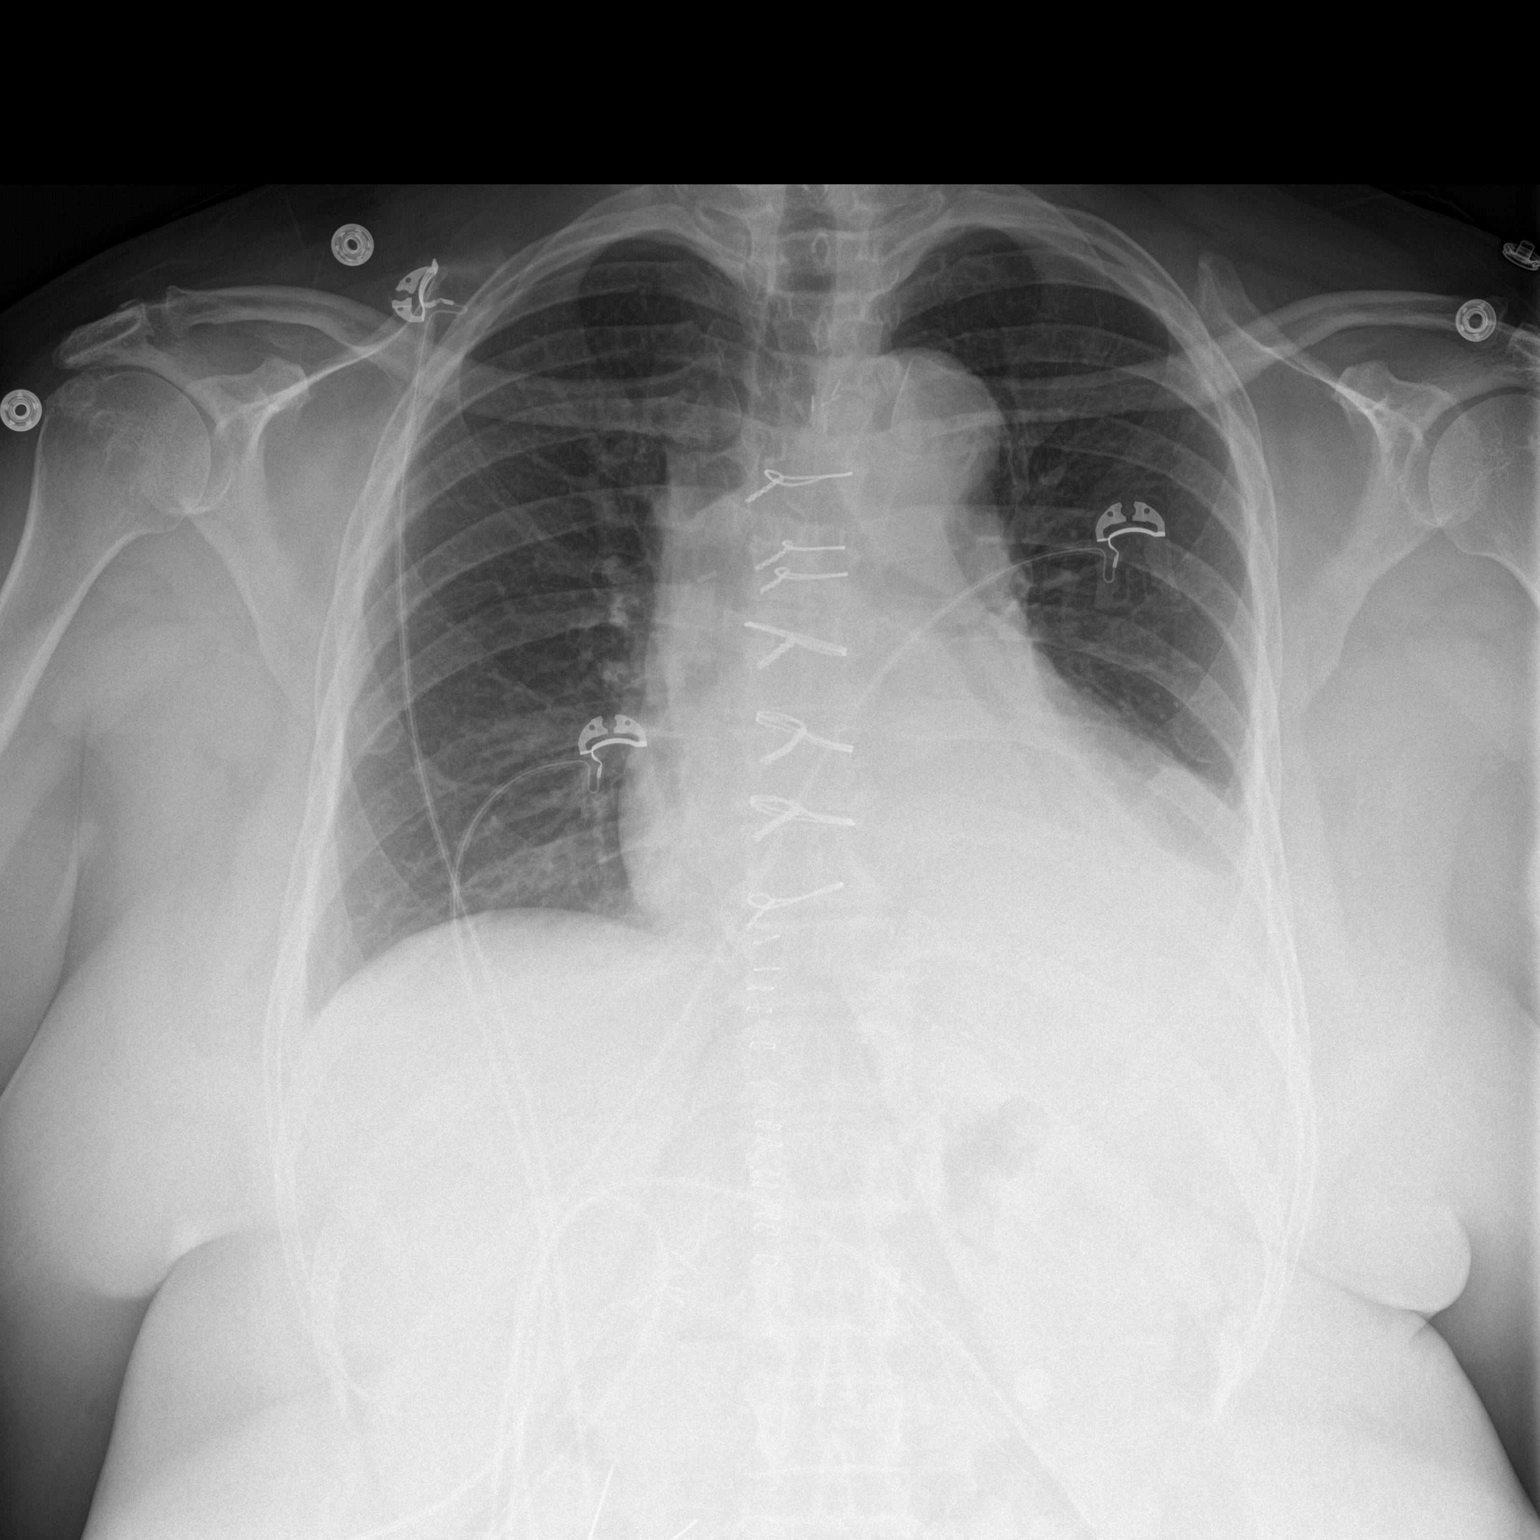

[chest lat]
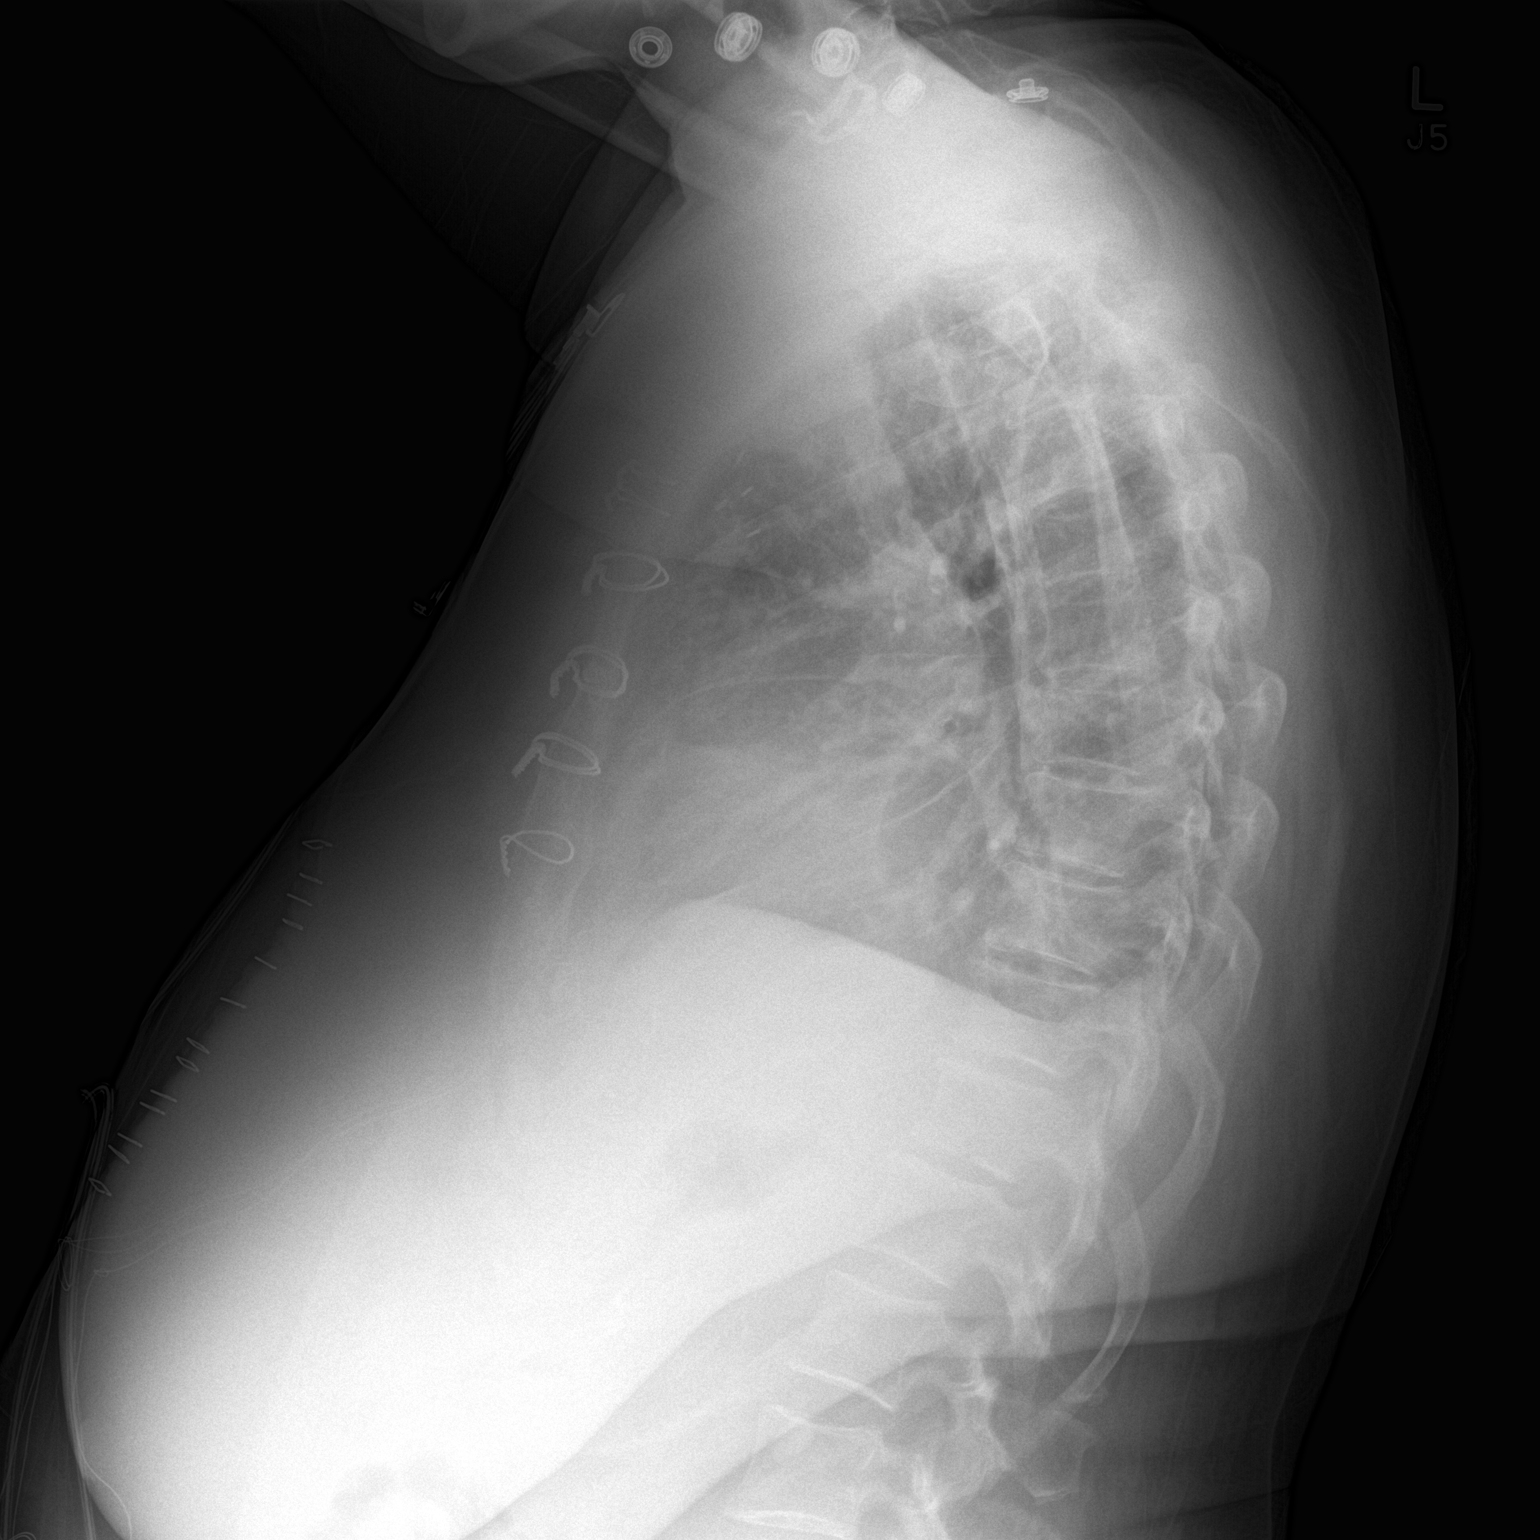

[2 of 2 positions shown; findings below may reference images not displayed]

FINDINGS: Cardiomediastinal silhouette unchanged in size and contour, with no
evidence of pulmonary vascular congestion.

Postoperative changes of median sternotomy, in this patient is
status post type a dissection repair.

Persisting left basilar opacity with obscuration of the left
hemidiaphragm and blunting of the cardiophrenic/costophrenic angles.
Lateral view demonstrates blunting of the costophrenic sulcus.
Overall aeration and fluid is improved from the comparison.

No visualized pneumothorax.

Degenerative changes of the shoulders.

No displaced fracture.

Surgical clips at the inferior liver margin and along the midline of
the upper abdomen/epigastric region.
IMPRESSION: Improving aeration at the left base, with persisting pleural fluid
and atelectasis/consolidation.

Status post median sternotomy and history of type a dissection
repair.

## 2016-01-16 ENCOUNTER — Other Ambulatory Visit: Payer: Self-pay | Admitting: Physician Assistant

## 2016-01-16 MED FILL — ?AMLODIPINE BESYLATE 10 MG: 10 | 30 days supply | Qty: 30 | Fill #7

## 2016-01-16 MED FILL — FOLIC ACID 1 MG TABLET: 1 | 30 days supply | Qty: 30 | Fill #2

## 2016-01-16 MED FILL — ATORVASTATIN 40 MG TABLET: 40 | 30 days supply | Qty: 30 | Fill #0

## 2016-01-16 MED FILL — LISINOPRIL 40 MG TABLET: 40 | 30 days supply | Qty: 30 | Fill #7

## 2016-01-21 ENCOUNTER — Ambulatory Visit: Payer: Medicaid Other | Admitting: Family Medicine

## 2016-02-03 NOTE — Progress Notes (Signed)
CARDIOLOGY OFFICE NOTE  Date:  02/04/2016    Alexis Wheeler Date of Birth: May 03, 1955 Medical Record [id number]  PCP:  Truitt Merle, NP  Cardiologist:  Rosanne Sack   Chief Complaint  Patient presents with  . Hypertension    5 week check - seen for Dr. Radford Pax.     History of Present Illness: Alexis Wheeler is a 61 y.o. female who presents today for a 5 week check. Seen for Dr. Radford Pax.   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 She underwent replacement of the ascending aorta with resuspension of the AV with 30 mm Hemashield Platinum graft with R axillary artery cannulation by Dr. Servando Snare. EF was 40-45% post op. Follow up echo from 07/2014 showed improvement of her EF - now at 55 to 60%.   Admitted back in December of 2016 with uncontrolled HTN. Medicines were all changed. She had stopped some.  She was referred back here for management of BP. Did not have PCP. She was placed on spironolactone and changed her atenolol to carvedilol.   I then saw her back several times - BP still too high. Did not tolerate Hydralazine. Complained of what sounded like exertional fatigue - she was not cathed prior to her AAA dissection and I was worried about CAD. Myoview was obtained - turned out to be high risk - was referred for cath - this was reassuring - only with very mild CAD that is nonobstructive. When seen back in May of 2017 was having what sounded like pain from kidney stones. Ended up seeing GU.   I last saw her a month ago - money was a big issue. Medicines a little mixed up - on 2 beta blockers and not clear why - got that straightened out and increased the dose of the Coreg for better BP control. She was otherwise felt to be doing ok.   Comes back today. Here alone.  She has not taken any of her medicines today - says she forgot. BP high here  today. She remains fatigued - this is chronic. She attributes this to her weight. Not very active. BP at home about 130/80 consistently. Wanting to get a new PCP. Thinking about going back to see Dr. Marlou Sa. No chest pain. Breathing is stable. Still trying to find her a "little job".    Past Medical History:  Diagnosis Date  . Arthritis    right knee  . Congestive dilated cardiomyopathy ([place]) 08/01/2014  . GERD   . HEMATURIA UNSPECIFIED   . History of kidney stones   . HYPERLIPIDEMIA   . HYPERTENSION   . Morbid obesity ([place])   . MYOSITIS   . Sleep apnea    cpap-  doesnt use it- last study years ago  . Vertigo     Past Surgical History:  Procedure Laterality Date  . ABDOMINAL HYSTERECTOMY  1983  . CARDIAC CATHETERIZATION N/A 04/30/2015   Procedure: Left Heart Cath and Coronary Angiography;  Surgeon: Peter M Martinique, MD;  Location: [facility];  Service: Cardiovascular;  Laterality: N/A;  . CHOLECYSTECTOMY  2001  . CYSTOSCOPY W/ RETROGRADES Left 08/02/2015   Procedure: CYSTOSCOPY WITH LEFT RETROGRADE PYELOGRAM;  Surgeon: Festus Aloe, MD;  [facility];  Service: Urology;  Laterality: Left;  . CYSTOSCOPY WITH URETEROSCOPY, STONE BASKETRY AND STENT PLACEMENT  Left 08/02/2015   Procedure:  Mitzi Davenport ;  Surgeon: Festus Aloe, MD;  [facility];  Service: Urology;  Laterality: Left;  . CYSTOSCOPY/URETEROSCOPY/HOLMIUM LASER/STENT PLACEMENT Left 08/02/2015   Procedure: CYSTOSCOPY/URETEROSCOPY/HOLMIUM LASER/STENT PLACEMENT-LEFT;  Surgeon: Festus Aloe, MD;  [facility];  Service: Urology;  Laterality: Left;  . HOLMIUM LASER APPLICATION Left [id number]   Procedure: HOLMIUM LASER APPLICATION;  Surgeon: Festus Aloe, MD;  [facility];  Service: Urology;  Laterality: Left;  . REPLACEMENT ASCENDING AORTA N/A 03/03/2014   Procedure: REPAIR OF ASCENDING AORTIC DISSECTION;  Surgeon: Grace Isaac, MD;  Location: [facility];  Service: Open Heart Surgery;  Laterality:  N/A;     Medications: Current Outpatient Prescriptions  Medication Sig Dispense Refill  . amLODipine (NORVASC) 10 MG tablet Take 1 tablet (10 mg total) by mouth daily. 30 tablet 11  . aspirin EC 81 MG EC tablet Take 1 tablet (81 mg total) by mouth daily.    [name] atorvastatin (LIPITOR) 40 MG tablet Take 1 tablet (40 mg total) by mouth daily at 6 PM. 90 tablet 3  . carvedilol (COREG) 12.5 MG tablet Take 1 tablet (12.5 mg total) by mouth 2 (two) times daily. 99991111 tablet 3  . folic acid (FOLVITE) 1 MG tablet TAKE 1 TABLET BY MOUTH DAILY. 30 tablet 6  . lisinopril (PRINIVIL,ZESTRIL) 40 MG tablet Take 1 tablet (40 mg total) by mouth daily. 30 tablet 11  . spironolactone (ALDACTONE) 50 MG tablet Take 1 tablet (50 mg total) by mouth daily. 30 tablet 6  . vitamin E 400 UNIT capsule Take 400 Units by mouth daily.     No current facility-administered medications for this visit.     Allergies: Allergies  Allergen Reactions  . Hydralazine Nausea Only  . Motrin [Ibuprofen] Other (See Comments)    Upset GI (motrin brand causes the side effect)    Social History: The patient  reports that she has never smoked. She has never used smokeless tobacco. She reports that she does not drink alcohol or use drugs.   Family History: The patient's family history includes Aortic dissection (age of onset: 79) in her mother; Breast cancer in her sister; Breast cancer (age of onset: 76) in her mother; Hypertension in her daughter, father, mother, and sister; Throat cancer in her brother.   Review of Systems: Please see the history of present illness.   Otherwise, the review of systems is positive for none.   All other systems are reviewed and negative.   Physical Exam: VS:  BP (!) 176/90   Pulse 76   Ht 5\' 3"  (1.6 m)   Wt 197 lb (89.4 kg)   SpO2 98% Comment: at rest  BMI 34.90 kg/m  .  BMI Body mass index is 34.9 kg/m.  Wt Readings from Last 3 Encounters:  02/04/16 197 lb (89.4 kg)  12/24/15 195 lb 1.9  oz (88.5 kg)  12/13/15 192 lb (87.1 kg)    General: Pleasant. Well developed, well nourished and in no acute distress.   HEENT: Normal.  Neck: Supple, no JVD, carotid bruits, or masses noted.  Cardiac: Regular rate and rhythm. No murmurs, rubs, or gallops. No edema.  Respiratory:  Lungs are clear to auscultation bilaterally with normal work of breathing.  GI: Soft and nontender.  MS: No deformity or atrophy. Gait and ROM intact.  Skin: Warm and dry. Color is normal.  Neuro:  Strength and sensation are intact and no gross focal deficits noted.  Psych: Alert, appropriate  and with normal affect.   LABORATORY DATA:  EKG:  EKG is not ordered today.  Lab Results  Component Value Date   WBC 5.6 12/24/2015   HGB 12.6 12/24/2015   HCT 39.3 12/24/2015   PLT 218 12/24/2015   GLUCOSE 82 12/24/2015   CHOL 155 12/24/2015   TRIG 133 12/24/2015   HDL 53 12/24/2015   LDLCALC 75 12/24/2015   ALT 12 12/24/2015   AST 12 12/24/2015   NA 142 12/24/2015   K 3.6 12/24/2015   CL 107 12/24/2015   CREATININE 1.24 (H) 12/24/2015   BUN 16 12/24/2015   CO2 28 12/24/2015   TSH 0.78 03/08/2015   INR 0.94 04/26/2015   HGBA1C 5.8 (H) 03/07/2014    BNP (last 3 results) No results for input(s): BNP in the last 8760 hours.  ProBNP (last 3 results) No results for input(s): PROBNP in the last 8760 hours.   Other Studies Reviewed Today:  Cardiac Cath Procedures 04/2015   Left Heart Cath and Coronary Angiography    Conclusion    Ost RCA to Prox RCA lesion, 30% stenosed.  Mid LAD lesion, 20% stenosed.  The left ventricular systolic function is normal.  1. Mild nonobstructive CAD 2. Normal LV function.  Plan: medical management.       Myoview Study Highlights4/2017    The left ventricular ejection fraction is moderately decreased (30-44%).  Nuclear stress EF: 42%.  Defect 1: There is a large defect of moderate severity.  Findings consistent with ischemia.  No T  wave inversion was noted during stress.  There was no ST segment deviation noted during stress.  Large size, moderate severity reversible (SDS 9) inferolateral perfusion defect suggestive of ischemia. LVEF 42% with inferolateral hypokinesis. This is a high risk study based on the extent of ischemia.    Echo Study Conclusions from 07/2014  - Left ventricle: The cavity size was normal. There was mild focal  basal hypertrophy of the septum. Systolic function was normal.  The estimated ejection fraction was in the range of 55% to 60%.  Wall motion was normal; there were no regional wall motion  abnormalities. There was an increased relative contribution of  atrial contraction to ventricular filling. Doppler parameters are  consistent with abnormal left ventricular relaxation (grade 1  diastolic dysfunction). - Aortic valve: Trileaflet; normal thickness, mildly calcified  leaflets. There was trivial regurgitation. - Aorta: Aortic root dimension: 42 mm (ED). - Aortic root: The aortic root was mildly dilated. - Mitral valve: There was trivial regurgitation.  Assessment/Plan:  1. HTN - she has not had her medicines today. Now reports better BP control at home. I have left her on her current regimen. See back in 3 months.   2. HLD - on statin - labs from December noted.   3. Prior aortic dissection repair - to see Dr. Servando Snare in March  4. Exertional Fatigue - had high risk Myoview - but with reassuring cardiac cath findings.   5. Iron deficiency anemia - back on Folic acid.   6. Prior dilated CM - last echo showed EF had recovered but was lower by recent Myoview - yet normal at time of cardiac cath - she looks good clinically and I have left her on her current regimen.  7. Poor social situation         Current medicines are reviewed with the patient today.  The patient does not have concerns regarding medicines other than what has been noted above.  The  following changes have been made:  See above.  Labs/ tests ordered today include:   No orders of the defined types were placed in this encounter.    Disposition:   FU with me in 3 months. She is going to try and see if Dr. Marlou Sa will accept her for primary care. Seeing EBG in early March.   Patient is agreeable to this plan and will call if any problems develop in the interim.   SignedTruitt Merle, NP  02/04/2016 11:25 AM  [facility] [address] [place], [place]  [address] Phone: [phone] Fax: [phone]

## 2016-02-04 ENCOUNTER — Ambulatory Visit (INDEPENDENT_AMBULATORY_CARE_PROVIDER_SITE_OTHER): Payer: Medicaid Other | Admitting: Nurse Practitioner

## 2016-02-04 ENCOUNTER — Ambulatory Visit: Payer: Medicaid Other | Admitting: Nurse Practitioner

## 2016-02-04 ENCOUNTER — Encounter: Payer: Self-pay | Admitting: Nurse Practitioner

## 2016-02-04 VITALS — BP 176/90 | HR 76 | Ht 63.0 in | Wt 197.0 lb

## 2016-02-04 DIAGNOSIS — I10 Essential (primary) hypertension: Secondary | ICD-10-CM

## 2016-02-04 DIAGNOSIS — Z9889 Other specified postprocedural states: Secondary | ICD-10-CM | POA: Diagnosis not present

## 2016-02-04 NOTE — Patient Instructions (Addendum)
We will be checking the following labs today - NONE   Medication Instructions:    Continue with your current medicines.     Testing/Procedures To Be Arranged:  N/A  Follow-Up:   See me in 3 months.     Other Special Instructions:   Keep a check on your BP for me  Go by and see if Dr. Marlou Sa will do your primary care.     If you need a refill on your cardiac medications before your next appointment, please call your pharmacy.   Call the [facility] office at [phone] if you have any questions, problems or concerns.

## 2016-02-07 ENCOUNTER — Other Ambulatory Visit: Payer: Self-pay | Admitting: *Deleted

## 2016-02-07 DIAGNOSIS — I71019 Dissection of thoracic aorta, unspecified: Secondary | ICD-10-CM

## 2016-02-07 DIAGNOSIS — I7101 Dissection of thoracic aorta: Secondary | ICD-10-CM

## 2016-02-13 MED FILL — ?LISINOPRIL 40 MG TABLET: 40 MG | 30 days supply | Qty: 30 | Fill #8

## 2016-02-13 MED FILL — FOLIC ACID 1 MG TABLET: 1 | 30 days supply | Qty: 30 | Fill #3

## 2016-02-13 MED FILL — ATORVASTATIN 40 MG TABLET: 40 | 30 days supply | Qty: 30 | Fill #1

## 2016-02-13 MED FILL — ?AMLODIPINE BESYLATE 10 MG: 10 | 30 days supply | Qty: 30 | Fill #8

## 2016-03-07 ENCOUNTER — Observation Stay (HOSPITAL_COMMUNITY)
Admission: EM | Admit: 2016-03-07 | Discharge: 2016-03-08 | Disposition: A | Discharge status: Home / Self Care | Payer: Medicaid Other | Attending: Internal Medicine | Admitting: Internal Medicine

## 2016-03-07 ENCOUNTER — Emergency Department (HOSPITAL_COMMUNITY): Payer: Medicaid Other

## 2016-03-07 ENCOUNTER — Encounter (HOSPITAL_COMMUNITY): Payer: Self-pay

## 2016-03-07 DIAGNOSIS — R0789 Other chest pain: Secondary | ICD-10-CM | POA: Diagnosis not present

## 2016-03-07 DIAGNOSIS — Z9889 Other specified postprocedural states: Secondary | ICD-10-CM

## 2016-03-07 DIAGNOSIS — R0602 Shortness of breath: Secondary | ICD-10-CM | POA: Insufficient documentation

## 2016-03-07 DIAGNOSIS — I10 Essential (primary) hypertension: Secondary | ICD-10-CM | POA: Insufficient documentation

## 2016-03-07 DIAGNOSIS — E785 Hyperlipidemia, unspecified: Secondary | ICD-10-CM | POA: Diagnosis present

## 2016-03-07 DIAGNOSIS — I2510 Atherosclerotic heart disease of native coronary artery without angina pectoris: Secondary | ICD-10-CM | POA: Diagnosis present

## 2016-03-07 DIAGNOSIS — K219 Gastro-esophageal reflux disease without esophagitis: Secondary | ICD-10-CM | POA: Diagnosis present

## 2016-03-07 DIAGNOSIS — Z79899 Other long term (current) drug therapy: Secondary | ICD-10-CM | POA: Insufficient documentation

## 2016-03-07 DIAGNOSIS — Z7982 Long term (current) use of aspirin: Secondary | ICD-10-CM | POA: Insufficient documentation

## 2016-03-07 DIAGNOSIS — R079 Chest pain, unspecified: Secondary | ICD-10-CM

## 2016-03-07 DIAGNOSIS — I252 Old myocardial infarction: Secondary | ICD-10-CM | POA: Diagnosis not present

## 2016-03-07 DIAGNOSIS — M545 Low back pain, unspecified: Secondary | ICD-10-CM | POA: Diagnosis present

## 2016-03-07 DIAGNOSIS — G8929 Other chronic pain: Secondary | ICD-10-CM | POA: Diagnosis present

## 2016-03-07 DIAGNOSIS — I5189 Other ill-defined heart diseases: Secondary | ICD-10-CM | POA: Diagnosis present

## 2016-03-07 HISTORY — DX: Atherosclerotic heart disease of native coronary artery without angina pectoris: I25.10

## 2016-03-07 LAB — BASIC METABOLIC PANEL
Anion gap: 10 (ref 5–15)
BUN: 11 mg/dL (ref 6–20)
CO2: 24 mmol/L (ref 22–32)
Calcium: 9.1 mg/dL (ref 8.9–10.3)
Chloride: 105 mmol/L (ref 101–111)
Creatinine, Ser: 1.01 mg/dL — ABNORMAL HIGH (ref 0.44–1.00)
GFR calc Af Amer: >60 mL/min (ref >60)
GFR calc non Af Amer: 59 mL/min — ABNORMAL LOW (ref >60)
Glucose, Bld: 105 mg/dL — ABNORMAL HIGH (ref 65–99)
Potassium: 3.7 mmol/L (ref 3.5–5.1)
Sodium: 139 mmol/L (ref 135–145)

## 2016-03-07 LAB — I-STAT TROPONIN, ED
Troponin i, poc: 0 ng/mL (ref 0.00–0.08)
Troponin i, poc: 0 ng/mL (ref 0.00–0.08)

## 2016-03-07 LAB — CBC
HCT: 39.7 % (ref 36.0–46.0)
Hemoglobin: 12.8 g/dL (ref 12.0–15.0)
MCH: 22.9 pg — ABNORMAL LOW (ref 26.0–34.0)
MCHC: 32.2 g/dL (ref 30.0–36.0)
MCV: 70.9 fL — ABNORMAL LOW (ref 78.0–100.0)
Platelets: 200 10*3/uL (ref 150–400)
RBC: 5.6 MIL/uL — ABNORMAL HIGH (ref 3.87–5.11)
RDW: 14.6 % (ref 11.5–15.5)
WBC: 6.9 10*3/uL (ref 4.0–10.5)

## 2016-03-07 MED ORDER — ENOXAPARIN SODIUM 40 MG/0.4ML ~~LOC~~ SOLN: 40.0000 mg | SUBCUTANEOUS | Status: DC

## 2016-03-07 MED ORDER — SODIUM CHLORIDE 0.9% FLUSH
3.0000 mL | Freq: Two times a day (BID) | INTRAVENOUS | Status: DC
  Administered 2016-03-07: 3 mL via INTRAVENOUS

## 2016-03-07 MED ORDER — NITROGLYCERIN 0.4 MG SL SUBL
0.4000 mg | SUBLINGUAL_TABLET | Freq: Once | SUBLINGUAL | Status: AC
Start: 2016-03-07 — End: 2016-03-07
  Administered 2016-03-07: 0.4 mg via SUBLINGUAL
  Filled 2016-03-07: qty 1

## 2016-03-07 MED ORDER — ONDANSETRON HCL 4 MG/2ML IJ SOLN
4.0000 mg | Freq: Four times a day (QID) | INTRAMUSCULAR | Status: DC | PRN
Start: 2016-03-07 — End: 2016-03-08

## 2016-03-07 MED ORDER — ACETAMINOPHEN 325 MG PO TABS
650.0000 mg | ORAL_TABLET | ORAL | Status: DC | PRN
  Administered 2016-03-08: 650 mg via ORAL
  Filled 2016-03-07: qty 2

## 2016-03-07 MED ORDER — ASPIRIN 81 MG PO CHEW
324.0000 mg | CHEWABLE_TABLET | Freq: Once | ORAL | Status: AC
  Administered 2016-03-07: 324 mg via ORAL
  Filled 2016-03-07: qty 4

## 2016-03-07 MED ORDER — NITROGLYCERIN 0.4 MG SL SUBL
0.4000 mg | SUBLINGUAL_TABLET | Freq: Once | SUBLINGUAL | Status: AC
  Administered 2016-03-07: 0.4 mg via SUBLINGUAL
  Filled 2016-03-07: qty 1

## 2016-03-07 NOTE — ED Triage Notes (Signed)
Pt complaining of tightness in mid chest. Pt denies any SOB or cough. Pt states MI in 2016. Pt denies any chest pain.

## 2016-03-07 NOTE — ED Notes (Signed)
ED Provider at bedside. 

## 2016-03-07 NOTE — H&P (Signed)
History and Physical    Alexis Wheeler [id number] DOB: [birth year] DOA: 03/07/2016  PCP: Truitt Merle, NP   Patient coming from: Home.  Chief Complaint: Chest pain.  HPI: Alexis Wheeler is a 61 y.o. female with medical history significant of osteoarthritis of the right knee, CHF, GERD, hyperlipidemia, hypertension, morbid obesity, sleep apnea not using CPAP, urolithiasis who is coming to the emergency department for evaluation no chest pain off chest tightness episodes yesterday and today.  Per patient, yesterday evening while she was sitting down, she developed a chest pressure, associated with right arm cramping and mild dyspnea. She mentions that she took  aspirin and went to sleep. She woke up without significant discomfort. Today around 1500, she began having similar symptoms, but denies nausea, dizziness, diaphoresis, palpitations, recent pitting edema lower extremities, PND or orthopnea. She denies headache, sore throat, productive cough, abdominal pain, emesis, diarrhea, melena or hematochezia. She states that sometimes she gets constipated. She denies dysuria, frequency or hematuria. She denies appetite changes, has chronic difficulty sleeping after she had her MI.  ED Course: She was given nitroglycerin in the emergency department which relieved her pressure. She states currently is 1/10 in intensity. Troponin levels 2 have been negative. 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 Her chest radiograph is normal. WBC 6.9, hemoglobin 12.8 and platelets 200. Her BMP shows a nonfasting glucose level of 105 mg/dL, but is otherwise unremarkable  Review of Systems: As per HPI otherwise 10 point review of systems negative.    Past Medical History:  Diagnosis Date  . Arthritis    right knee  . Congestive dilated cardiomyopathy ([place]) 08/01/2014  . GERD   . HEMATURIA UNSPECIFIED   . History of kidney stones   . HYPERLIPIDEMIA   .  HYPERTENSION   . Morbid obesity ([place])   . MYOSITIS   . Sleep apnea    cpap-  doesnt use it- last study years ago  . Vertigo     Past Surgical History:  Procedure Laterality Date  . ABDOMINAL HYSTERECTOMY  1983  . CARDIAC CATHETERIZATION N/A 04/30/2015   Procedure: Left Heart Cath and Coronary Angiography;  Surgeon: Peter M Martinique, MD;  Location: [place] [facility];  Service: Cardiovascular;  Laterality: N/A;  . CHOLECYSTECTOMY  2001  . CYSTOSCOPY W/ RETROGRADES Left 08/02/2015   Procedure: CYSTOSCOPY WITH LEFT RETROGRADE PYELOGRAM;  Surgeon: Festus Aloe, MD;  Location: [facility];  Service: Urology;  Laterality: Left;  . CYSTOSCOPY WITH URETEROSCOPY, STONE BASKETRY AND STENT PLACEMENT Left 08/02/2015   Procedure:  Mitzi Davenport ;  Surgeon: Festus Aloe, MD;  Location: [facility];  Service: Urology;  Laterality: Left;  . CYSTOSCOPY/URETEROSCOPY/HOLMIUM LASER/STENT PLACEMENT Left 08/02/2015   Procedure: CYSTOSCOPY/URETEROSCOPY/HOLMIUM LASER/STENT PLACEMENT-LEFT;  Surgeon: Festus Aloe, MD;  Location: [facility];  Service: Urology;  Laterality: Left;  . HOLMIUM LASER APPLICATION Left [id number]   Procedure: HOLMIUM LASER APPLICATION;  Surgeon: Festus Aloe, MD;  Location: [facility];  Service: Urology;  Laterality: Left;  . REPLACEMENT ASCENDING AORTA N/A 03/03/2014   Procedure: REPAIR OF ASCENDING AORTIC DISSECTION;  Surgeon: Grace Isaac, MD;  Location: [place];  Service: Open Heart Surgery;  Laterality: N/A;     reports that she has never smoked. She has never used smokeless tobacco. She reports that she does not drink alcohol or use drugs.  Allergies  Allergen Reactions  . Hydralazine Nausea Only  . Motrin [Ibuprofen] Other (See Comments)    Upset GI (  motrin brand causes the side effect)    Family History  Problem Relation Age of Onset  . Breast cancer Mother 51  . Aortic dissection Mother 47  . Hypertension Mother   . Throat cancer Brother     1 bro living  . Hypertension  Father   . Breast cancer Sister   . Hypertension Sister   . Hypertension Daughter   . Seizures Neg Hx     Prior to Admission medications   Medication Sig Start Date End Date Taking? Authorizing Provider  amLODipine (NORVASC) 10 MG tablet Take 1 tablet (10 mg total) by mouth daily. 04/12/15   Burtis Junes, NP  aspirin EC 81 MG EC tablet Take 1 tablet (81 mg total) by mouth daily. 03/14/14   Coolidge Breeze, PA-C  atorvastatin (LIPITOR) 40 MG tablet Take 1 tablet (40 mg total) by mouth daily at 6 PM. 12/24/15   Burtis Junes, NP  carvedilol (COREG) 12.5 MG tablet Take 1 tablet (12.5 mg total) by mouth 2 (two) times daily. 12/24/15 03/23/16  Burtis Junes, NP  folic acid (FOLVITE) 1 MG tablet TAKE 1 TABLET BY MOUTH DAILY. 10/21/15   Burtis Junes, NP  lisinopril (PRINIVIL,ZESTRIL) 40 MG tablet Take 1 tablet (40 mg total) by mouth daily. 04/12/15   Burtis Junes, NP  spironolactone (ALDACTONE) 50 MG tablet Take 1 tablet (50 mg total) by mouth daily. 04/16/15   Burtis Junes, NP  vitamin E 400 UNIT capsule Take 400 Units by mouth daily.    Historical Provider, MD    Physical Exam:  Constitutional: NAD, calm, comfortable Vitals:   03/07/16 2146 03/07/16 2200 03/07/16 2230 03/07/16 2300  BP:  113/69 128/68 124/70  Pulse:  66 80 (!) 55  Resp:  15 16 17   Temp:      TempSrc:      SpO2:  93% 98% 99%  Weight: 88.5 kg (195 lb)     Height: 5\' 3"  (1.6 m)      Eyes: PERRL, lids and conjunctivae normal ENMT: Mucous membranes are moist. Posterior pharynx clear of any exudate or lesions.  Neck: normal, supple, no masses, no thyromegaly Respiratory: clear to auscultation bilaterally, no wheezing, no crackles. Normal respiratory effort. No accessory muscle use.  Cardiovascular: Regular rate and rhythm, no murmurs / rubs / gallops. No extremity edema. 2+ pedal pulses. No carotid bruits.  Abdomen: Soft, no tenderness, no masses palpated. No hepatosplenomegaly. Bowel sounds positive.    Musculoskeletal: no clubbing / cyanosis. Good ROM, no contractures. Normal muscle tone.  Skin: no rashes, lesions, ulcers on limited skin exam Neurologic: CN 2-12 grossly intact. Sensation intact, DTR normal. Strength 5/5 in all 4.  Psychiatric: Normal judgment and insight. Alert and oriented x 3. Normal mood.    Labs on Admission: I have personally reviewed following labs and imaging studies  CBC:  Recent Labs Lab 03/07/16 1853  WBC 6.9  HGB 12.8  HCT 39.7  MCV 70.9*  PLT [id number]   Basic Metabolic Panel:  Recent Labs Lab 03/07/16 1853  NA 139  K 3.7  CL 105  CO2 24  GLUCOSE 105*  BUN 11  CREATININE 1.01*  CALCIUM 9.1   GFR: Estimated Creatinine Clearance: 61.7 mL/min (by C-G formula based on SCr of 1.01 mg/dL (H)). Liver Function Tests: No results for input(s): AST, ALT, ALKPHOS, BILITOT, PROT, ALBUMIN in the last 168 hours. No results for input(s): LIPASE, AMYLASE in the last 168 hours. No results for input(s):  AMMONIA in the last 168 hours. Coagulation Profile: No results for input(s): INR, PROTIME in the last 168 hours. Cardiac Enzymes: No results for input(s): CKTOTAL, CKMB, CKMBINDEX, TROPONINI in the last 168 hours. BNP (last 3 results) No results for input(s): PROBNP in the last 8760 hours. HbA1C: No results for input(s): HGBA1C in the last 72 hours. CBG: No results for input(s): GLUCAP in the last 168 hours. Lipid Profile: No results for input(s): CHOL, HDL, LDLCALC, TRIG, CHOLHDL, LDLDIRECT in the last 72 hours. Thyroid Function Tests: No results for input(s): TSH, T4TOTAL, FREET4, T3FREE, THYROIDAB in the last 72 hours. Anemia Panel: No results for input(s): VITAMINB12, FOLATE, FERRITIN, TIBC, IRON, RETICCTPCT in the last 72 hours. Urine analysis:    Component Value Date/Time   COLORURINE YELLOW 06/07/2015 [place] 06/07/2015 1526   LABSPEC 1.020 06/07/2015 1526   PHURINE 5.5 06/07/2015 1526   GLUCOSEU NEGATIVE 06/07/2015 1526    GLUCOSEU NEGATIVE 07/09/2011 0743   HGBUR NEGATIVE 06/07/2015 1526   BILIRUBINUR NEGATIVE 06/07/2015 1526   BILIRUBINUR neg 09/30/2012 1438   KETONESUR NEGATIVE 06/07/2015 1526   PROTEINUR 1+ (A) 06/07/2015 1526   UROBILINOGEN negative 09/30/2012 1438   UROBILINOGEN 0.2 04/12/2012 0720   NITRITE NEGATIVE 06/07/2015 1526   LEUKOCYTESUR 3+ (A) 06/07/2015 1526    Radiological Exams on Admission: Dg Chest 2 View  Result Date: 03/07/2016 CLINICAL DATA:  Chest pain with slight shortness of breath since yesterday. Repair of ascending aorta 2016. EXAM: CHEST  2 VIEW COMPARISON:  07/12/2014 and chest CT 02/21/2015 FINDINGS: Sternotomy wires unchanged. Lungs are adequately inflated without focal consolidation or effusion. Cardiomediastinal silhouette and remainder of the exam is unchanged. IMPRESSION: No active cardiopulmonary disease. Electronically Signed   By: Marin Olp M.D.   On: 03/07/2016 20:09     04/30/2015 Left Heart Cath and Coronary Angiography  Conclusion    Ost RCA to Prox RCA lesion, 30% stenosed.  Mid LAD lesion, 20% stenosed.  The left ventricular systolic function is normal.   1. Mild nonobstructive CAD 2. Normal LV function.  Plan: medical management.    08/03/2014 echocardiogram ------------------------------------------------------------------- LV EF: 55% -   60%  ------------------------------------------------------------------- Indications:      (I42.0).  ------------------------------------------------------------------- History:   PMH:  Congestive dilated cardiomyopathy. Thoracoabdominal aortic dissection. Acquired from the patient and from the patient&'s chart.  Risk factors:  Hypertension. Morbidly obese. Dyslipidemia.  ------------------------------------------------------------------- Study Conclusions  - Left ventricle: The cavity size was normal. There was mild focal   basal hypertrophy of the septum. Systolic function was normal.    The estimated ejection fraction was in the range of 55% to 60%.   Wall motion was normal; there were no regional wall motion   abnormalities. There was an increased relative contribution of   atrial contraction to ventricular filling. Doppler parameters are   consistent with abnormal left ventricular relaxation (grade 1   diastolic dysfunction). - Aortic valve: Trileaflet; normal thickness, mildly calcified   leaflets. There was trivial regurgitation. - Aorta: Aortic root dimension: 42 mm (ED). - Aortic root: The aortic root was mildly dilated. - Mitral valve: There was trivial regurgitation.   EKG: Independently reviewed.  Vent. rate 63 BPM PR interval 188 ms QRS duration 82 ms QT/QTc 448/458 ms P-R-T axes 66 47 79 Normal sinus rhythm Cannot rule out Anterior infarct , age undetermined Abnormal ECG  Assessment/Plan Principal Problem:   Chest pain   S/P aortic dissection repair The patient is scheduled for CT chest aorta  with and without contrast for Thursday. Will go ahead and get this done while she is in the hospital given symptoms. Telemetry monitoring. Trend troponin levels.  Nitroglycerin when necessary. Analgesics as needed. Continue aspirin, statin and beta blocker.  Active Problems:   Coronary artery disease Continue aspirin, atorvastatin and carvedilol. Trend troponin level and cardiac monitoring.    Diastolic dysfunction without heart failure Continue carvedilol 12.5 mg by mouth daily. Continue Aldactone 50 mg by mouth daily. Continue lisinopril 40 mg by mouth daily.    Hyperlipidemia with target LDL less than 70 Continue atorvastatin    GERD Pantoprazole 40 mg by mouth daily.    Chronic low back pain No significant symptoms at this time. Analgesics and muscle relaxants as needed.      Unspecified essential hypertension Continue amlodipine 10 mg by mouth daily. Continue carvedilol 12.5 mg by mouth twice a day. Continue lisinopril 40 mg by mouth  daily. Monitor blood pressure.    DVT prophylaxis: SCDs. Code Status: Full code. Family Communication:  Disposition Plan: Admit for cardiac monitoring and troponin levels trending. Consults called: Cardiology was consulted by EDP, recommended hospitalist service admission.  Admission status: Observation/SDU.   Reubin Milan MD [facility] Pager [phone].  If 7PM-7AM, please contact night-coverage [email or web address] Password [facility]  03/07/2016, 11:42 PM

## 2016-03-07 NOTE — ED Provider Notes (Signed)
[place] DEPT Provider Note   CSN: [id number] Arrival date & time: 03/07/16 1846     History    Chief Complaint  Patient presents with  . Chest Pain     HPI Alexis Wheeler is a 61 y.o. female.  61yo F w/ PMH including CAD, aortic dissection s/p repair, HTN, HLD who p/w chest pain. Around 8 PM last night while at rest, the patient began having central chest tightness associated with right arm cramping and mild shortness of breath. She took an aspirin and fell asleep. Today around 3 PM, she began having the same chest tightness began. The tightness is still present currently but is mild. She denies any associated nausea, vomiting, diaphoresis. She denies any fevers or cold symptoms. No recent illness. She states she is compliant with her medications. She states she has not had chest tightness like this before and states this is very different from the severe pain she had during aortic dissection. No recent travel, leg swelling/pain, history of blood clots, or history of cancer.   Past Medical History:  Diagnosis Date  . Arthritis    right knee  . Congestive dilated cardiomyopathy ([place]) 08/01/2014  . GERD   . HEMATURIA UNSPECIFIED   . History of kidney stones   . HYPERLIPIDEMIA   . HYPERTENSION   . Morbid obesity ([place])   . Myocardial infarction 2016  . MYOSITIS   . Sleep apnea    cpap-  doesnt use it- last study years ago  . Vertigo      Patient Active Problem List   Diagnosis Date Noted  . Fatigue 04/30/2015  . Hypokalemia 12/20/2014  . Prolonged Q-T interval on ECG 12/20/2014  . 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 Dilated cardiomyopathy ([place]) 08/01/2014  . Plantar fasciitis of left foot 05/02/2014  . Non-compliant behavior 03/27/2014  . S/P aortic dissection repair 03/03/2014  . Lumbosacral spondylosis without myelopathy 01/26/2014  . Greater trochanteric bursitis of right hip 10/10/2013  . Chronic low back pain 09/26/2013  . Chronic  right hip pain 09/26/2013  . Allergic rhinitis 08/29/2012  . Kidney stone 12/31/2010  . Morbid obesity ([place]) 12/18/2009  . GERD 12/18/2009  . HEMATURIA UNSPECIFIED 12/17/2009  . Hyperlipidemia with target LDL less than 70 12/16/2009  . Hypertensive heart disease 12/16/2009    Past Surgical History:  Procedure Laterality Date  . ABDOMINAL HYSTERECTOMY  1983  . CARDIAC CATHETERIZATION N/A 04/30/2015   Procedure: Left Heart Cath and Coronary Angiography;  Surgeon: Peter M Martinique, MD;  Location: [place] [facility];  Service: Cardiovascular;  Laterality: N/A;  . CHOLECYSTECTOMY  2001  . CYSTOSCOPY W/ RETROGRADES Left 08/02/2015   Procedure: CYSTOSCOPY WITH LEFT RETROGRADE PYELOGRAM;  Surgeon: Festus Aloe, MD;  [facility];  Service: Urology;  Laterality: Left;  . CYSTOSCOPY WITH URETEROSCOPY, STONE BASKETRY AND STENT PLACEMENT Left 08/02/2015   Procedure:  Mitzi Davenport ;  Surgeon: Festus Aloe, MD;  [facility];  Service: Urology;  Laterality: Left;  . CYSTOSCOPY/URETEROSCOPY/HOLMIUM LASER/STENT PLACEMENT Left 08/02/2015   Procedure: CYSTOSCOPY/URETEROSCOPY/HOLMIUM LASER/STENT PLACEMENT-LEFT;  Surgeon: Festus Aloe, MD;  [facility];  Service: Urology;  Laterality: Left;  . HOLMIUM LASER APPLICATION Left [id number]   Procedure: HOLMIUM LASER APPLICATION;  Surgeon: Festus Aloe, MD;  [facility];  Service: Urology;  Laterality: Left;  . REPLACEMENT ASCENDING AORTA N/A 03/03/2014   Procedure: REPAIR OF ASCENDING AORTIC DISSECTION;  Surgeon: Grace Isaac, MD;  Location: [place];  Service: Open Heart Surgery;  Laterality: N/A;    OB History    No data available        Home Medications    Prior to Admission medications   Medication Sig Start Date End Date Taking? Authorizing Provider  amLODipine (NORVASC) 10 MG tablet Take 1 tablet (10 mg total) by mouth daily. 04/12/15   Burtis Junes, NP  aspirin EC 81 MG EC tablet Take 1 tablet (81 mg total) by  mouth daily. 03/14/14   Coolidge Breeze, PA-C  atorvastatin (LIPITOR) 40 MG tablet Take 1 tablet (40 mg total) by mouth daily at 6 PM. 12/24/15   Burtis Junes, NP  carvedilol (COREG) 12.5 MG tablet Take 1 tablet (12.5 mg total) by mouth 2 (two) times daily. 12/24/15 03/23/16  Burtis Junes, NP  folic acid (FOLVITE) 1 MG tablet TAKE 1 TABLET BY MOUTH DAILY. 10/21/15   Burtis Junes, NP  lisinopril (PRINIVIL,ZESTRIL) 40 MG tablet Take 1 tablet (40 mg total) by mouth daily. 04/12/15   Burtis Junes, NP  spironolactone (ALDACTONE) 50 MG tablet Take 1 tablet (50 mg total) by mouth daily. 04/16/15   Burtis Junes, NP  vitamin E 400 UNIT capsule Take 400 Units by mouth daily.    Historical Provider, MD      Family History  Problem Relation Age of Onset  . Breast cancer Mother 7  . Aortic dissection Mother 26  . Hypertension Mother   . Throat cancer Brother     1 bro living  . Hypertension Father   . Breast cancer Sister   . Hypertension Sister   . Hypertension Daughter   . Seizures Neg Hx      Social History  Substance Use Topics  . Smoking status: Never Smoker  . 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 Alcohol use No     Allergies     Hydralazine and Motrin [ibuprofen]    Review of Systems  10 Systems reviewed and are negative for acute change except as noted in the HPI.   Physical Exam Updated Vital Signs BP 124/70   Pulse (!) 55   Temp 98 F (36.7 C) (Oral)   Resp 17   Ht 5\' 3"  (1.6 m)   Wt 195 lb (88.5 kg)   SpO2 99%   BMI 34.54 kg/m   Physical Exam  Constitutional: She is oriented to person, place, and time. She appears well-developed and well-nourished. No distress.  HENT:  Head: Normocephalic and atraumatic.  Moist mucous membranes  Eyes: Conjunctivae are normal. Pupils are equal, round, and reactive to light.  Neck: Neck supple.  Cardiovascular:  Normal rate, regular rhythm and normal heart sounds.   No murmur heard. Pulmonary/Chest: Effort normal and breath sounds normal.  Abdominal: Soft. Bowel sounds are normal. She exhibits no distension. There is no tenderness.  Musculoskeletal: She exhibits no edema.  Neurological: She is alert and oriented to person, place, and time.  Fluent speech  Skin: Skin is warm and dry.  Psychiatric: She has a normal mood and affect. Judgment normal.  Nursing note and vitals reviewed.     ED Treatments / Results  Labs (all labs ordered are listed, but only abnormal results are displayed) Labs Reviewed  BASIC METABOLIC PANEL - Abnormal; Notable for the following:       Result Value   Glucose, Bld 105 (*)  Creatinine, Ser 1.01 (*)    GFR calc non Af Amer 59 (*)    All other components within normal limits  CBC - Abnormal; Notable for the following:    RBC 5.60 (*)    MCV 70.9 (*)    MCH 22.9 (*)    All other components within normal limits  I-STAT TROPOININ, ED  I-STAT TROPOININ, ED     EKG  EKG Interpretation  Date/Time:  Saturday March 07 2016 18:53:03 EST Ventricular Rate:  63 PR Interval:  188 QRS Duration: 82 QT Interval:  448 QTC Calculation: 458 R Axis:   47 Text Interpretation:  Normal sinus rhythm Cannot rule out Anterior infarct , age undetermined Abnormal ECG No significant change since last tracing Confirmed by Sunaina Ferrando MD, Ruby Dilone [phone]) on 03/07/2016 9:18:39 PM         Radiology Dg Chest 2 View  Result Date: 03/07/2016 CLINICAL DATA:  Chest pain with slight shortness of breath since yesterday. Repair of ascending aorta 2016. EXAM: CHEST  2 VIEW COMPARISON:  07/12/2014 and chest CT 02/21/2015 FINDINGS: Sternotomy wires unchanged. Lungs are adequately inflated without focal consolidation or effusion. Cardiomediastinal silhouette and remainder of the exam is unchanged. IMPRESSION: No active cardiopulmonary disease. Electronically Signed   By: Marin Olp M.D.    On: 03/07/2016 20:09    Procedures Procedures (including critical care time) Procedures  Medications Ordered in ED  Medications  aspirin chewable tablet 324 mg (324 mg Oral Given 03/07/16 2150)  nitroGLYCERIN (NITROSTAT) SL tablet 0.4 mg (0.4 mg Sublingual Given 03/07/16 2151)  nitroGLYCERIN (NITROSTAT) SL tablet 0.4 mg (0.4 mg Sublingual Given 03/07/16 2233)     Initial Impression / Assessment and Plan / ED Course  I have reviewed the triage vital signs and the nursing notes.  Pertinent labs & imaging results that were available during my care of the patient were reviewed by me and considered in my medical decision making (see chart for details).     Pt w/ cardiac hx p/w chest tightness that occurred last night and recurred again this afternoon. She was comfortable on exam, vital signs notable for mild hypertension. EKG without acute ischemic changes. Initial troponin negative. Reassuring basic lab work. Chest x-ray negative acute. Gave the patient aspirin and nitroglycerin. Pt reported improvement in CP after NTG.   I am concerned about her recurrent chest tightness given her known CAD. Discussed with cardiology, Dr. Jeannine Boga, who agreed with plan to admit to medicine for chest pain rule out. Discussed w/ [facility], Dr. Olevia Bowens, appreciate his assistance. Pt admitted for further w/u of chest pain. Final Clinical Impressions(s) / ED Diagnoses   Final diagnoses:  Chest pain, unspecified type     New Prescriptions   No medications on file       Sharlett Iles, MD 03/07/16 2337

## 2016-03-08 ENCOUNTER — Encounter (HOSPITAL_COMMUNITY): Payer: Self-pay | Admitting: Radiology

## 2016-03-08 ENCOUNTER — Observation Stay (HOSPITAL_BASED_OUTPATIENT_CLINIC_OR_DEPARTMENT_OTHER): Payer: Medicaid Other

## 2016-03-08 ENCOUNTER — Observation Stay (HOSPITAL_COMMUNITY): Payer: Medicaid Other

## 2016-03-08 DIAGNOSIS — I2510 Atherosclerotic heart disease of native coronary artery without angina pectoris: Secondary | ICD-10-CM | POA: Diagnosis present

## 2016-03-08 DIAGNOSIS — R072 Precordial pain: Secondary | ICD-10-CM | POA: Diagnosis not present

## 2016-03-08 DIAGNOSIS — I5189 Other ill-defined heart diseases: Secondary | ICD-10-CM | POA: Diagnosis present

## 2016-03-08 DIAGNOSIS — M79609 Pain in unspecified limb: Secondary | ICD-10-CM

## 2016-03-08 DIAGNOSIS — K219 Gastro-esophageal reflux disease without esophagitis: Secondary | ICD-10-CM

## 2016-03-08 DIAGNOSIS — I25118 Atherosclerotic heart disease of native coronary artery with other forms of angina pectoris: Secondary | ICD-10-CM

## 2016-03-08 DIAGNOSIS — R079 Chest pain, unspecified: Secondary | ICD-10-CM | POA: Diagnosis not present

## 2016-03-08 LAB — CREATININE, SERUM
Creatinine, Ser: 0.94 mg/dL (ref 0.44–1.00)
GFR calc Af Amer: >60 mL/min (ref >60)
GFR calc non Af Amer: >60 mL/min (ref >60)

## 2016-03-08 LAB — TYPE AND SCREEN
ABO/RH(D): A POS
Antibody Screen: NEGATIVE

## 2016-03-08 LAB — BRAIN NATRIURETIC PEPTIDE: B Natriuretic Peptide: 16.9 pg/mL (ref 0.0–100.0)

## 2016-03-08 LAB — HIV ANTIBODY (ROUTINE TESTING W REFLEX): HIV Screen 4th Generation wRfx: NONREACTIVE

## 2016-03-08 LAB — MRSA PCR SCREENING: MRSA by PCR: NEGATIVE

## 2016-03-08 LAB — TROPONIN I: Troponin I: <0.03 ng/mL (ref <0.03)

## 2016-03-08 MED ORDER — SODIUM CHLORIDE 0.9 % IV SOLN
INTRAVENOUS | Status: AC
  Administered 2016-03-08: 04:00:00 via INTRAVENOUS

## 2016-03-08 MED ORDER — SPIRONOLACTONE 25 MG PO TABS
50.0000 mg | ORAL_TABLET | Freq: Every day | ORAL | Status: DC
  Administered 2016-03-08: 50 mg via ORAL
  Filled 2016-03-08 (×2): qty 2

## 2016-03-08 MED ORDER — NITROGLYCERIN 0.4 MG SL SUBL: 0.4000 mg | SUBLINGUAL_TABLET | SUBLINGUAL | Status: DC | PRN

## 2016-03-08 MED ORDER — ATORVASTATIN CALCIUM 40 MG PO TABS: 40.0000 mg | ORAL_TABLET | Freq: Every day | ORAL | Status: DC

## 2016-03-08 MED ORDER — AMLODIPINE BESYLATE 10 MG PO TABS
10.0000 mg | ORAL_TABLET | Freq: Every day | ORAL | Status: DC
  Administered 2016-03-08: 10 mg via ORAL
  Filled 2016-03-08: qty 1

## 2016-03-08 MED ORDER — ASPIRIN EC 81 MG PO TBEC
81.0000 mg | DELAYED_RELEASE_TABLET | Freq: Every day | ORAL | Status: DC
  Administered 2016-03-08: 81 mg via ORAL
  Filled 2016-03-08: qty 1

## 2016-03-08 MED ORDER — VITAMIN E 180 MG (400 UNIT) PO CAPS
400.0000 [IU] | ORAL_CAPSULE | Freq: Every day | ORAL | Status: DC
  Administered 2016-03-08: 400 [IU] via ORAL
  Filled 2016-03-08: qty 1

## 2016-03-08 MED ORDER — CARVEDILOL 12.5 MG PO TABS
12.5000 mg | ORAL_TABLET | Freq: Two times a day (BID) | ORAL | Status: DC
Start: 2016-03-08 — End: 2016-03-08
  Administered 2016-03-08: 12.5 mg via ORAL
  Filled 2016-03-08: qty 1

## 2016-03-08 MED ORDER — IOPAMIDOL (ISOVUE-370) INJECTION 76%
INTRAVENOUS | Status: AC
  Administered 2016-03-08: 80 mL
  Filled 2016-03-08: qty 100

## 2016-03-08 MED ORDER — LISINOPRIL 40 MG PO TABS
40.0000 mg | ORAL_TABLET | Freq: Every day | ORAL | Status: DC
  Administered 2016-03-08: 40 mg via ORAL
  Filled 2016-03-08 (×2): qty 1

## 2016-03-08 MED ORDER — FOLIC ACID 1 MG PO TABS
1.0000 mg | ORAL_TABLET | Freq: Every day | ORAL | Status: DC
  Administered 2016-03-08: 1 mg via ORAL
  Filled 2016-03-08: qty 1

## 2016-03-08 MED ORDER — PANTOPRAZOLE SODIUM 40 MG PO TBEC
40.0000 mg | DELAYED_RELEASE_TABLET | Freq: Every day | ORAL | Status: DC
  Administered 2016-03-08: 40 mg via ORAL
  Filled 2016-03-08: qty 1

## 2016-03-08 NOTE — ED Notes (Signed)
Patient transported to CT 

## 2016-03-08 NOTE — Consult Note (Signed)
Cardiology Consult    Patient ID: Alexis Wheeler MRN: [id number], DOB/AGE: [birth year]   Admit date: 03/07/2016 Date of Consult: 03/08/2016  Primary Physician: Truitt Merle, NP Reason for Consult: Chest Pain Primary Cardiologist: Dr. Radford Pax Requesting Provider: Dr. Reesa Chew   History of Present Illness    Alexis Wheeler is a 61 y.o. female with past medical history of Type 1 Aortic Dissection (s/p repair in 02/2014), HTN, HLD, and mild nonobstructive CAD by cath in 04/2015 who presented to [facility] ED on 03/07/2016 for evaluation of chest pain.   She was recently seen by Truitt Merle on 02/04/2016. BP elevated to 176/90 at that time but reported not taking any of her medications prior to her appointment. Denied any chest discomfort or dyspnea with exertion. Was continued on Amlodipine 10mg  daily, ASA, Lipitor 40mg  daily, Coreg 12.5mg  BID, Lisinopril 40mg  daily, and Spironolactone 50mg  daily.   In talking with the patient today, she initially developed chest discomfort on Thursday (03/05/2016) while at rest. She took an ASA and her pain resolved within 30 minutes. She was doing well but developed recurrent pain on the day of arrival to the ED. She awoke from her nap and then developed chest tightness. This did not resolve with ASA therefore she called EMS. Was given 2 SL NTG with resolution of her symptoms. She denies any associated nausea, vomiting, diaphoresis, or dyspnea with exertion. No orthopnea, PND, or lower extremity edema.   Initial labs show a WBC of 6.9, Hgb 12.8, and platelets 200. K+ 3.7. Creatinine 1.01. Cyclic troponin values have been negative. BNP 16.9.CXR showed no evidence of active cardiopulmonary disease. CTA obtained and showed stable appearance of Type A Aortic dissection s/p repair when compared to prior studies. A small PE involving the subsegmental branch of the RLL was noted which is thought to represent an acute nonocclusive PE vs. chronic scarring.     Past Medical History   Past Medical History:  Diagnosis Date  . Arthritis    right knee  . CAD (coronary artery disease)    a. 04/2015: cath showed 30% Ost RCA stenosis, 20% mid-LAD stenosis, and normal LV function.   . Congestive dilated cardiomyopathy ([place]) 08/01/2014  . GERD   . HEMATURIA UNSPECIFIED   . History of kidney stones   . HYPERLIPIDEMIA   . HYPERTENSION   . Morbid obesity ([place])   . MYOSITIS   . Sleep apnea    cpap-  doesnt use it- last study years ago  . Vertigo     Past Surgical History:  Procedure Laterality Date  . ABDOMINAL HYSTERECTOMY  1983  . CARDIAC CATHETERIZATION N/A 04/30/2015   Procedure: Left Heart Cath and Coronary Angiography;  Surgeon: Peter M Martinique, MD;  Location: [facility];  Service: Cardiovascular;  Laterality: N/A;  . CHOLECYSTECTOMY  2001  . CYSTOSCOPY W/ RETROGRADES Left 08/02/2015   Procedure: CYSTOSCOPY WITH LEFT RETROGRADE PYELOGRAM;  Surgeon: Festus Aloe, MD;  Location: [facility];  Service: Urology;  Laterality: Left;  . CYSTOSCOPY WITH URETEROSCOPY, STONE BASKETRY AND STENT PLACEMENT Left 08/02/2015   Procedure:  Mitzi Davenport ;  Surgeon: Festus Aloe, MD;  Location: [facility];  Service: Urology;  Laterality: Left;  . CYSTOSCOPY/URETEROSCOPY/HOLMIUM LASER/STENT PLACEMENT Left 08/02/2015   Procedure: CYSTOSCOPY/URETEROSCOPY/HOLMIUM LASER/STENT PLACEMENT-LEFT;  Surgeon: Festus Aloe, MD;  Location: [facility];  Service: Urology;  Laterality: Left;  . HOLMIUM LASER APPLICATION Left [id number]   Procedure: HOLMIUM LASER APPLICATION;  Surgeon: Festus Aloe, MD;  Location: [facility];  Service: Urology;  Laterality: Left;  . REPLACEMENT ASCENDING AORTA N/A 03/03/2014   Procedure: REPAIR OF ASCENDING AORTIC DISSECTION;  Surgeon: Grace Isaac, MD;  Location: [place];  Service: Open Heart Surgery;  Laterality: N/A;     Allergies  Allergies  Allergen Reactions  . Hydralazine Nausea Only  . Motrin [Ibuprofen] Nausea Only     Upset GI (motrin brand causes the side effect)    Inpatient Medications    . amLODipine  10 mg Oral Daily  . aspirin EC  81 mg Oral Daily  . atorvastatin  40 mg Oral q1800  . carvedilol  12.5 mg Oral BID WC  . folic acid  1 mg Oral Daily  . lisinopril  40 mg Oral Daily  . pantoprazole  40 mg Oral Daily  . sodium chloride flush  3 mL Intravenous Q12H  . spironolactone  50 mg Oral Daily  . vitamin E  400 Units Oral Daily    Family History    Family History  Problem Relation Age of Onset  . Breast cancer Mother 56  . Aortic dissection Mother 64  . Hypertension Mother   . Throat cancer Brother     1 bro living  . Hypertension Father   . Breast cancer Sister   . Hypertension Sister   . Hypertension Daughter   . Seizures Neg Hx     Social History    Social History   Social History  . Marital status: Divorced    Spouse name: N/A  . Number of children: N/A  . Years of education: N/A   Occupational History  . Not on file.   Social History Main Topics  . Smoking status: Never Smoker  . 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 Alcohol use No  . Drug use: No  . Sexual activity: No   Other Topics Concern  . Not on file   Social History Narrative  . No narrative on file     Review of Systems    General:  No chills, fever, night sweats or weight changes.  Cardiovascular:  No dyspnea on exertion, edema, orthopnea, palpitations, paroxysmal nocturnal dyspnea. Positive for chest pain.  Dermatological: No rash, lesions/masses Respiratory: No cough, dyspnea Urologic: No hematuria, dysuria Abdominal:   No nausea, vomiting, diarrhea, bright red blood per rectum, melena, or hematemesis Neurologic:  No visual changes, wkns, changes in mental status. All other systems reviewed and are otherwise negative except as noted above.  Physical Exam    Blood pressure (!) 147/72,  pulse 69, temperature 98.4 F (36.9 C), temperature source Oral, resp. rate 16, height 5\' 3"  (1.6 m), weight 193 lb 6.4 oz (87.7 kg), SpO2 97 %.  General: Pleasant, African American female appearing in NAD Psych: Normal affect. Neuro: Alert and oriented X 3. Moves all extremities spontaneously. HEENT: Normal  Neck: Supple without bruits or JVD. Lungs:  Resp regular and unlabored, CTA without wheezing or rales. Heart: RRR no s3, s4, or murmurs. Abdomen: Soft, non-tender, non-distended, BS + x 4.  Extremities: No clubbing, cyanosis or edema. DP/PT/Radials 2+ and equal bilaterally.  Labs    Troponin [facility] Test)  Recent Labs  03/07/16 2240  TROPIPOC 0.00    Recent Labs  03/08/16 0404  TROPONINI <0.03   Lab Results  Component Value Date   WBC 6.9 03/07/2016  HGB 12.8 03/07/2016   HCT 39.7 03/07/2016   MCV 70.9 (L) 03/07/2016   PLT 200 03/07/2016     Recent Labs Lab 03/07/16 1853 03/08/16 0148  NA 139  --   K 3.7  --   CL 105  --   CO2 24  --   BUN 11  --   CREATININE 1.01* 0.94  CALCIUM 9.1  --   GLUCOSE 105*  --    Lab Results  Component Value Date   CHOL 155 12/24/2015   HDL 53 12/24/2015   LDLCALC 75 12/24/2015   TRIG 133 12/24/2015   No results found for: [facility]   Radiology Studies    Dg Chest 2 View  Result Date: 03/07/2016 CLINICAL DATA:  Chest pain with slight shortness of breath since yesterday. Repair of ascending aorta 2016. EXAM: CHEST  2 VIEW COMPARISON:  07/12/2014 and chest CT 02/21/2015 FINDINGS: Sternotomy wires unchanged. Lungs are adequately inflated without focal consolidation or effusion. Cardiomediastinal silhouette and remainder of the exam is unchanged. IMPRESSION: No active cardiopulmonary disease. Electronically Signed   By: Marin Olp M.D.   On: 03/07/2016 20:09   Ct Angio Chest/abd/pel For Dissection W And/or W/wo  Result Date: 03/08/2016 CLINICAL DATA:  61 year old female with chest tightness. History of type 1  aortic dissection repair. EXAM: CT ANGIOGRAPHY CHEST, ABDOMEN AND PELVIS TECHNIQUE: Multidetector CT imaging through the chest, abdomen and pelvis was performed using the standard protocol during bolus administration of intravenous contrast. Multiplanar reconstructed images and MIPs were obtained and reviewed to evaluate the vascular anatomy. CONTRAST:  80 cc Isovue 370 COMPARISON:  Chest radiograph dated 03/07/2016 and CT dated 02/21/2015 FINDINGS: CTA CHEST FINDINGS Cardiovascular: There is a stable moderate cardiomegaly. No pericardial effusion. Type A aortic dissection again noted with postsurgical changes of repair of the root of the aorta. The ascending aortic graft appears unremarkable and similar to the prior radiograph. There is no extravasation of contrast. There is contrast in both true and false lumen. There is extension of the dissection flap into the proximal portion of the right innominate artery. This findings are similar to prior CT. The origins of the great vessels of the aortic arch appear to arise from the true lumen. The central pulmonary arteries appear unremarkable and patent. There is however a small pulmonary embolus in the subsegmental branch point of the right lower lobe (series 601 image 83 and coronal series 602, image 105). This clot appears nonocclusive and may be chronic in nature and represent scarring. Correlation with clinical exam and history of prior TS recommended. Mediastinum/Nodes: There is no hilar or mediastinal adenopathy. The esophagus is grossly unremarkable. Lungs/Pleura: The lungs are clear. There is no pleural effusion or pneumothorax. The central airways are patent. Musculoskeletal: Median sternotomy wires. No acute osseous pathology. Review of the MIP images confirms the above findings. CTA ABDOMEN AND PELVIS FINDINGS VASCULAR Aorta: Type A dissection extends just above the aortic bifurcation. There is contrast in the false lumen although appears less dense compared  to the contrast within the true lumen. Celiac: The origin of the celiac axis arises from the true lumen and appears patent. SMA: The origin of the SMA appears patent and arises from the true lumen. Renals: The right renal artery appears patent and arises from the true lumen. The left renal artery is patent as well. The origin of the left renal artery is not seen with certainty. IMA: The IMA is patent and arises from the true lumen. Inflow: Mild atherosclerotic  calcification of the common iliac arteries. The common iliac external and internal iliac arteries are patent. Veins: No obvious venous abnormality within the limitations of this arterial phase study. Review of the MIP images confirms the above findings. No intra-abdominal free air.  Trace fluid within the pelvis NON-VASCULAR Hepatobiliary: Cholecystectomy. The liver is unremarkable. No intrahepatic biliary ductal dilatation. Pancreas: Unremarkable. No pancreatic ductal dilatation or surrounding inflammatory changes. Spleen: Normal in size without focal abnormality. Adrenals/Urinary Tract: Stable 2.2 cm left adrenal adenoma. The right adrenal gland is unremarkable. There is left renal lower pole atrophy and scarring. The right kidney is unremarkable. There is no hydronephrosis on either side. The visualized ureters and urinary bladder appear unremarkable. Stomach/Bowel: There is colonic diverticulosis without active inflammatory changes. Moderate amount of stool noted throughout the colon. There is no evidence of bowel obstruction or active inflammation. The appendix is not visualized with certainty. No inflammatory changes identified in the right lower quadrant. Lymphatic: No adenopathy. Reproductive: Hysterectomy. Other: None Musculoskeletal: Degenerate changes of the spine. No acute fracture. Review of the MIP images confirms the above findings. IMPRESSION: 1. Stable appearing type A aortic dissection status post prior repair of the aortic root. Overall the  appearance of the aorta and dissection is similar to the prior study. No periaortic inflammation or extravasation of contrast. 2. Small pulmonary embolism involving the subsegmental branch point of the right lower lobe. This may represent an acute nonocclusive PE versus chronic scarring. Correlation with clinical exam and history of prior PE recommended. 3. Moderate cardiomegaly similar to prior CT. 4. Colonic diverticulosis without active inflammatory changes. No bowel obstruction. 5. Trace free fluid within the pelvis of indeterminate etiology. These results were called by telephone at the time of interpretation on 03/08/2016 at 3:01 am to Dr. Tennis Must , who verbally acknowledged these results. Electronically Signed   By: Anner Crete M.D.   On: 03/08/2016 03:08    EKG & Cardiac Imaging    EKG: NSR, HR 63, with Q3 pattern noted. - Personally Reviewed  Echocardiogram: 07/2014 Study Conclusions  - Left ventricle: The cavity size was normal. There was mild focal   basal hypertrophy of the septum. Systolic function was normal.   The estimated ejection fraction was in the range of 55% to 60%.   Wall motion was normal; there were no regional wall motion   abnormalities. There was an increased relative contribution of   atrial contraction to ventricular filling. Doppler parameters are   consistent with abnormal left ventricular relaxation (grade 1   diastolic dysfunction). - Aortic valve: Trileaflet; normal thickness, mildly calcified   leaflets. There was trivial regurgitation. - Aorta: Aortic root dimension: 42 mm (ED). - Aortic root: The aortic root was mildly dilated. - Mitral valve: There was trivial regurgitation.   Cardiac Catheterization: 04/30/2015  Ost RCA to Prox RCA lesion, 30% stenosed.  Mid LAD lesion, 20% stenosed.  The left ventricular systolic function is normal.   1. Mild nonobstructive CAD 2. Normal LV function.  Plan: medical management.  Assessment & Plan     1. Atypical Chest Pain - has developed episodes of chest discomfort while at rest with no associated symptoms. No exertional chest discomfort or dyspnea. Pain has been relieved with ASA and SL NTG.  - cyclic troponin values have been negative. EKG is without acute ST or T-wave changes. - had an abnormal NST in 04/2015. Cath was performed and showed 30% Ost RCA stenosis, 20% mid-LAD stenosis, and normal LV function.  -  CTA this admission does show possible nonocclusive PE. She denies any recent calf pain, lower extremity edema, or dyspnea. No recent travel. With her cardiac workup being negative thus far and minimal CAD by cath less than 1 year ago, would not pursue further ischemic evaluation.  2. Type 1 Aortic Dissection - s/p repair in 02/2014 - CTA this admission shows a stable appearance of this when compared to prior imaging.   3. HTN - BP at 113/67 - 151/79 in the past 24 hours. - continue Amlodipine 10mg  daily, Coreg 12.5mg  BID (would not titrate secondary to HR in mid-50's on telemetry), Lisinopril 40mg  daily, and Spironolactone 50mg  daily. Unable to tolerate Hydralazine in the past.   4. HLD - Lipid Panel in 12/2015 showed total cholesterol 155, HDL 53, and LDL 75. - continue Atorvastatin 40mg  daily.   5. Questionable PE  - CTA this admission read as showing a small PE involving the subsegmental branch of the RLL was noted which is thought to represent an acute nonocclusive PE vs. chronic scarring.  - consider initiation of anticoagulation pending review of images.    Signed, Erma Heritage, PA-C 03/08/2016, 8:03 AM Pager: [phone] As above, patient seen and examined. Briefly she is a 61 year old female with past medical history of type I aortic dissection status post repair, hypertension, hyperlipidemia for evaluation of chest pain. Patient had a cardiac catheterization in April 2017 that showed mild nonobstructive coronary disease. 2 days ago she had an hour of chest  tightness that was not pleuritic or positional. No radiation and no associated symptoms. Resolved spontaneously. She had a second episode yesterday. She has been admitted and cardiology asked to evaluate. Electrocardiogram shows sinus rhythm with no ST changes. Enzymes are negative. BNP normal. Chest CT shows stable aortic dissection repair. It has been interpreted as small pulmonary embolus in right lower lobe.  1 chest pain-symptoms are atypical. She does not typically have exertional chest pain. Electrocardiogram shows no ST changes and enzymes negative. Previous catheterization revealed mild nonobstructive coronary disease. Would not pursue further ischemia evaluation. CT shows stable aortic dissection repair. Possible small pulmonary embolus. She does not have risk factors for pulmonary embolus including no recent travel, surgery or injury to her lower extremities. Would review her films again with radiology. If they feel pulmonary embolus is definitive then she would need 3 months of anticoagulation.  2 Possible pulmonary embolus-plan as outlined above; would be reasonable to check lower ext dopplers.  3 Hypertension-blood pressure controlled. Continue present medications.   Kirk Ruths, MD

## 2016-03-08 NOTE — Progress Notes (Signed)
Pt in stable condition and d/c'd via wheelchair to private vehicle in stable condtion

## 2016-03-08 NOTE — Progress Notes (Signed)
PROGRESS NOTE    Alexis Wheeler  [id number] DOB: Dec 10, 1955 DOA: 03/07/2016 PCP: Truitt Merle, NP   Brief Narrative:  Alexis Wheeler is a 62 y.o. female with medical history significant of osteoarthritis of the right knee, CHF, GERD, hyperlipidemia, hypertension, morbid obesity, sleep apnea not using CPAP, urolithiasis who came to the ed with complaints of atypical chest pain. Patient's chest pain was noted to be atypical in nature, her CE remained negative and EKG did not show any acute changes. It was determined she doesn't need any further cardiac work up per cardiology at this time. Her CTA showed possible small non occ PE vs chronic scarring with stable typa A aortic dissection with repair.   Assessment & Plan:   Principal Problem:   Chest pain Active Problems:   Hyperlipidemia with target LDL less than 70   GERD   Chronic low back pain   S/P aortic dissection repair   Unspecified essential hypertension   Coronary artery disease   Diastolic dysfunction without heart failure  1. Atypical Chest Pain- possible from Pe? -CE x 3 neg at this time, EKG is no acute changes  -seen by the cardiologist, - no further work up recommended at this time  -cont home ASA, statin and BB -check A1c -Lipid panel done back in Dec 2017 - ok   2. Acute Nonocclusive PE vs chronic scarring  -will confirm with the radiologist, if this is PE she will need to be on anticoagulation. Patient is reluctant at this time to start on [facility] -spoke with Dr Kris Hartmann from radiology - he think this is likely a subacute PE vs resolving PE vs chronic scarring. Difficult to determine at this time  -will order LE [place] for now  3. Diastolic CHF, compensated -cont her home medicaitons   4. 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 CAD - stable   6. GERD - cont PPI  7. HTN -cont home meds - Norvasc, coreg and Lisinopril     DVT prophylaxis: SCDs Code  Status: Full Family Communication:  Non needed, patient can comprehend well  Disposition Plan: Obs for now, may get discharged later today   Consultants:   Cardiology   Procedures:   None     Subjective: States she has very little chest discomfort in the middle of her chest at this time, otherwise much improved. No other complaints.  I discussed the need for possible [facility] for her non occlusive Pe, but she is very reluctant to get on it at this time. I have discussed the risk and benefits of it.   Objective: Vitals:   03/08/16 0200 03/08/16 0230 03/08/16 0300 03/08/16 0740  BP: 116/71 119/68 (!) 147/72 119/72  Pulse: 64 64 69 63  Resp: 16 16  20   Temp:   98.4 F (36.9 C) 98.3 F (36.8 C)  TempSrc:   Oral Oral  SpO2: 97% 97%  95%  Weight:   87.7 kg (193 lb 6.4 oz)   Height:   5\' 3"  (1.6 m)     Intake/Output Summary (Last 24 hours) at 03/08/16 0914 Last data filed at 03/08/16 0600  Gross per 24 hour  Intake              440 ml  Output                0 ml  Net  440 ml   Filed Weights   03/07/16 2146 03/08/16 0300  Weight: 88.5 kg (195 lb) 87.7 kg (193 lb 6.4 oz)    Examination:  General exam: Appears calm and comfortable  Respiratory system: Clear to auscultation. Respiratory effort normal. Cardiovascular system: S1 & S2 heard, RRR. No JVD, murmurs, rubs, gallops or clicks. No pedal edema. Gastrointestinal system: Abdomen is nondistended, soft and nontender. No organomegaly or masses felt. Normal bowel sounds heard. Central nervous system: Alert and oriented. No focal neurological deficits. Extremities: Symmetric 5 x 5 power. Skin: No rashes, lesions or ulcers Psychiatry: Judgement and insight appear normal. Mood & affect appropriate.     Data Reviewed:   CBC:  Recent Labs Lab 03/07/16 1853  WBC 6.9  HGB 12.8  HCT 39.7  MCV 70.9*  PLT [id number]   Basic Metabolic Panel:  Recent Labs Lab 03/07/16 1853 03/08/16 0148  NA 139  --   K 3.7  --     CL 105  --   CO2 24  --   GLUCOSE 105*  --   BUN 11  --   CREATININE 1.01* 0.94  CALCIUM 9.1  --    GFR: Estimated Creatinine Clearance: 66 mL/min (by C-G formula based on SCr of 0.94 mg/dL). Liver Function Tests: No results for input(s): AST, ALT, ALKPHOS, BILITOT, PROT, ALBUMIN in the last 168 hours. No results for input(s): LIPASE, AMYLASE in the last 168 hours. No results for input(s): AMMONIA in the last 168 hours. Coagulation Profile: No results for input(s): INR, PROTIME in the last 168 hours. Cardiac Enzymes:  Recent Labs Lab 03/08/16 0404  TROPONINI <0.03   BNP (last 3 results) No results for input(s): PROBNP in the last 8760 hours. HbA1C: No results for input(s): HGBA1C in the last 72 hours. CBG: No results for input(s): GLUCAP in the last 168 hours. Lipid Profile: No results for input(s): CHOL, HDL, LDLCALC, TRIG, CHOLHDL, LDLDIRECT in the last 72 hours. Thyroid Function Tests: No results for input(s): TSH, T4TOTAL, FREET4, T3FREE, THYROIDAB in the last 72 hours. Anemia Panel: No results for input(s): VITAMINB12, FOLATE, FERRITIN, TIBC, IRON, RETICCTPCT in the last 72 hours. Sepsis Labs: No results for input(s): PROCALCITON, LATICACIDVEN in the last 168 hours.  Recent Results (from the past 240 hour(s))  MRSA PCR Screening     Status: None   Collection Time: 03/08/16  3:29 AM  Result Value Ref Range Status   MRSA by PCR NEGATIVE NEGATIVE Final    Comment:        The GeneXpert MRSA Assay (FDA approved for NASAL specimens only), is one component of a comprehensive MRSA colonization surveillance program. It is not intended to diagnose MRSA infection nor to guide or monitor treatment for MRSA infections.          Radiology Studies: Dg Chest 2 View  Result Date: 03/07/2016 CLINICAL DATA:  Chest pain with slight shortness of breath since yesterday. Repair of ascending aorta 2016. EXAM: CHEST  2 VIEW COMPARISON:  07/12/2014 and chest CT 02/21/2015  FINDINGS: Sternotomy wires unchanged. Lungs are adequately inflated without focal consolidation or effusion. Cardiomediastinal silhouette and remainder of the exam is unchanged. IMPRESSION: No active cardiopulmonary disease. Electronically Signed   By: Marin Olp M.D.   On: 03/07/2016 20:09   Ct Angio Chest/abd/pel For Dissection W And/or W/wo  Result Date: 03/08/2016 CLINICAL DATA:  61 year old female with chest tightness. History of type 1 aortic dissection repair. EXAM: CT ANGIOGRAPHY CHEST, ABDOMEN AND PELVIS TECHNIQUE: Multidetector CT  imaging through the chest, abdomen and pelvis was performed using the standard protocol during bolus administration of intravenous contrast. Multiplanar reconstructed images and MIPs were obtained and reviewed to evaluate the vascular anatomy. CONTRAST:  80 cc Isovue 370 COMPARISON:  Chest radiograph dated 03/07/2016 and CT dated 02/21/2015 FINDINGS: CTA CHEST FINDINGS Cardiovascular: There is a stable moderate cardiomegaly. No pericardial effusion. Type A aortic dissection again noted with postsurgical changes of repair of the root of the aorta. The ascending aortic graft appears unremarkable and similar to the prior radiograph. There is no extravasation of contrast. There is contrast in both true and false lumen. There is extension of the dissection flap into the proximal portion of the right innominate artery. This findings are similar to prior CT. The origins of the great vessels of the aortic arch appear to arise from the true lumen. The central pulmonary arteries appear unremarkable and patent. There is however a small pulmonary embolus in the subsegmental branch point of the right lower lobe (series 601 image 83 and coronal series 602, image 105). This clot appears nonocclusive and may be chronic in nature and represent scarring. Correlation with clinical exam and history of prior TS recommended. Mediastinum/Nodes: There is no hilar or mediastinal adenopathy. The  esophagus is grossly unremarkable. Lungs/Pleura: The lungs are clear. There is no pleural effusion or pneumothorax. The central airways are patent. Musculoskeletal: Median sternotomy wires. No acute osseous pathology. Review of the MIP images confirms the above findings. CTA ABDOMEN AND PELVIS FINDINGS VASCULAR Aorta: Type A dissection extends just above the aortic bifurcation. There is contrast in the false lumen although appears less dense compared to the contrast within the true lumen. Celiac: The origin of the celiac axis arises from the true lumen and appears patent. SMA: The origin of the SMA appears patent and arises from the true lumen. Renals: The right renal artery appears patent and arises from the true lumen. The left renal artery is patent as well. The origin of the left renal artery is not seen with certainty. IMA: The IMA is patent and arises from the true lumen. Inflow: Mild atherosclerotic calcification of the common iliac arteries. The common iliac external and internal iliac arteries are patent. Veins: No obvious venous abnormality within the limitations of this arterial phase study. Review of the MIP images confirms the above findings. No intra-abdominal free air.  Trace fluid within the pelvis NON-VASCULAR Hepatobiliary: Cholecystectomy. The liver is unremarkable. No intrahepatic biliary ductal dilatation. Pancreas: Unremarkable. No pancreatic ductal dilatation or surrounding inflammatory changes. Spleen: Normal in size without focal abnormality. Adrenals/Urinary Tract: Stable 2.2 cm left adrenal adenoma. The right adrenal gland is unremarkable. There is left renal lower pole atrophy and scarring. The right kidney is unremarkable. There is no hydronephrosis on either side. The visualized ureters and urinary bladder appear unremarkable. Stomach/Bowel: There is colonic diverticulosis without active inflammatory changes. Moderate amount of stool noted throughout the colon. There is no evidence of  bowel obstruction or active inflammation. The appendix is not visualized with certainty. No inflammatory changes identified in the right lower quadrant. Lymphatic: No adenopathy. Reproductive: Hysterectomy. Other: None Musculoskeletal: Degenerate changes of the spine. No acute fracture. Review of the MIP images confirms the above findings. IMPRESSION: 1. Stable appearing type A aortic dissection status post prior repair of the aortic root. Overall the appearance of the aorta and dissection is similar to the prior study. No periaortic inflammation or extravasation of contrast. 2. Small pulmonary embolism involving the subsegmental branch point of  the right lower lobe. This may represent an acute nonocclusive PE versus chronic scarring. Correlation with clinical exam and history of prior PE recommended. 3. Moderate cardiomegaly similar to prior CT. 4. Colonic diverticulosis without active inflammatory changes. No bowel obstruction. 5. Trace free fluid within the pelvis of indeterminate etiology. These results were called by telephone at the time of interpretation on 03/08/2016 at 3:01 am to Dr. Tennis Must , who verbally acknowledged these results. Electronically Signed   By: Anner Crete M.D.   On: 03/08/2016 03:08        Scheduled Meds: . amLODipine  10 mg Oral Daily  . aspirin EC  81 mg Oral Daily  . atorvastatin  40 mg Oral q1800  . carvedilol  12.5 mg Oral BID WC  . folic acid  1 mg Oral Daily  . lisinopril  40 mg Oral Daily  . pantoprazole  40 mg Oral Daily  . sodium chloride flush  3 mL Intravenous Q12H  . spironolactone  50 mg Oral Daily  . vitamin E  400 Units Oral Daily   Continuous Infusions: . sodium chloride 100 mL/hr at 03/08/16 0354     LOS: 0 days    Time spent: 40 mins     Ankit Arsenio Loader, MD [facility] Pager [phone]   If 7PM-7AM, please contact night-coverage [email or web address] Password [facility] 03/08/2016, 9:14 AM

## 2016-03-08 NOTE — Progress Notes (Signed)
Patient ID: Alexis Wheeler, female   DOB: March 24, 1955, 61 y.o.   MRN: [id number]  1. Stable appearing type A aortic dissection status post prior repair of the aortic root. Overall the appearance of the aorta and dissection is similar to the prior study. No periaortic inflammation or extravasation of contrast. 2. Small pulmonary embolism involving the subsegmental branch point of the right lower lobe. This may represent an acute nonocclusive PE versus chronic scarring. Correlation with clinical exam and history of prior PE recommended.  Please see full radiology report for further detail. The patient's chest pain improved with sublingual nitroglycerin. Not sure if this might represent pulmonary embolism or chronic scaring. I will defer the use of anticoagulation at this time, given the small size, nonocclusive pattern and questionable chronic scarring. However, continue clinical monitoring. Consider cardiology evaluation in the morning. The case was presented to cardiology on-call by Dr. Theotis Burrow last night, while the patient was in the emergency department.  Tennis Must, M.D.

## 2016-03-08 NOTE — Progress Notes (Signed)
*  PRELIMINARY RESULTS* Vascular Ultrasound Bilateral lower extremity venous duplex has been completed.  Preliminary findings: No evidence of deep vein thrombosis in the visualized veins of the lower extremities. Negative for baker's cysts bilaterally.  Preliminary results given to doctor at bedside, 14:50   Alexis Wheeler 03/08/2016, 2:55 PM

## 2016-03-08 NOTE — Discharge Summary (Signed)
Physician Discharge Summary  Alexis Wheeler [id number] DOB: 23-Nov-1955 DOA: 03/07/2016  PCP: Truitt Merle, NP  Admit date: 03/07/2016 Discharge date: 03/08/2016  Admitted From: Home Disposition:  Home  Recommendations for Outpatient Follow-up:  1. Follow up with PCP in 1-2 weeks   Home Health:No Equipment/Devices: None  Discharge Condition: Stable CODE STATUS: Full Diet recommendation: 2 g sodium diet  Brief/Interim Summary: Alexis Wheeler is a 61 y.o. female with medical history significant of osteoarthritis of the right knee, CHF, GERD, hyperlipidemia, hypertension, morbid obesity, sleep apnea not using CPAP, urolithiasis who is coming to the emergency department for evaluation no chest pain off chest tightness episodes yesterday and today. Upon admission she was noted to have normal EKG with old anterior infarct and negative cardiac enzymes 3. She was evaluated by by a cardiologist and it was determined she does not need further inpatient cardiac workup as she had a left heart cath done about 10 months ago which showed minimal coronary artery disease. CTA of the chest was done which showed possible small nonocclusive pulmonary embolism versus chronic scarring. I discussed the read per the radiologist who stated as could be a resolving pulmonary embolism versus some sort of scarring. Lower extremity Dopplers were done which was negative for DVT per prelim read. Patient is very reluctant to be on anticoagulation at this time therefore will discharge her without any anticoagulation and have her continue her home hypertension medications. I have discussed with her in detail to look for signs and symptoms of pulmonary embolism such as persistent or worsening chest pain, shortness of breath, dizziness, lightheadedness and lower extremity swelling. If she expresses any of that she needs to notify her primary care doctor. She can follow-up with her PCP in 2 weeks. At this time she is  medically stable to be discharged home with outpatient follow-up.  Discharge Diagnoses:  Principal Problem:   Chest pain Active Problems:   Hyperlipidemia with target LDL less than 70   GERD   Chronic low back pain   S/P aortic dissection repair   Unspecified essential hypertension   Coronary artery disease   Diastolic dysfunction without heart failure  1. Atypical Chest Pain- possible from Pe? - RESOLVED  -CE x 3 neg at this time, EKG is no acute changes  -seen by the cardiologist, - no further work up recommended at this time  -cont home ASA, statin and BB -Lipid panel done back in Dec 2017 - ok   2. Acute Nonocclusive PE vs chronic scarring  -will confirm with the radiologist, if this is PE she will need to be on anticoagulation. Patient is reluctant at this time to start on [facility] -spoke with Dr Kris Hartmann from radiology - he think this is likely a subacute PE vs resolving PE vs chronic scarring. Difficult to determine at this time  -LE dopplers neg for DVT - prelim read.   3. Diastolic CHF, compensated -cont her home medicaitons   4. 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 CAD - stable   6. GERD - cont PPI  7. HTN -cont home meds - Norvasc, coreg and Lisinopril   Discharge Instructions   Allergies as of 03/08/2016      Reactions   Hydralazine Nausea Only   Motrin [ibuprofen] Nausea Only   Upset GI (motrin brand causes the side effect)      Medication List    TAKE these medications   amLODipine 10  MG tablet Commonly known as:  NORVASC Take 1 tablet (10 mg total) by mouth daily.   aspirin 81 MG EC tablet Take 1 tablet (81 mg total) by mouth daily.   atorvastatin 40 MG tablet Commonly known as:  LIPITOR Take 1 tablet (40 mg total) by mouth daily at 6 PM.   carvedilol 12.5 MG tablet Commonly known as:  COREG Take 1 tablet (12.5 mg total) by mouth 2 (two) times daily.   Cholecalciferol 1000 units  tablet Take 1,000 Units by mouth daily.   folic acid 1 MG tablet Commonly known as:  FOLVITE TAKE 1 TABLET BY MOUTH DAILY.   lisinopril 40 MG tablet Commonly known as:  PRINIVIL,ZESTRIL Take 1 tablet (40 mg total) by mouth daily.   spironolactone 50 MG tablet Commonly known as:  ALDACTONE Take 1 tablet (50 mg total) by mouth daily.   vitamin E 400 UNIT capsule Take 400 Units by mouth daily.       Allergies  Allergen Reactions  . Hydralazine Nausea Only  . Motrin [Ibuprofen] Nausea Only    Upset GI (motrin brand causes the side effect)    Consultations:     Procedures/Studies: Dg Chest 2 View  Result Date: 03/07/2016 CLINICAL DATA:  Chest pain with slight shortness of breath since yesterday. Repair of ascending aorta 2016. EXAM: CHEST  2 VIEW COMPARISON:  07/12/2014 and chest CT 02/21/2015 FINDINGS: Sternotomy wires unchanged. Lungs are adequately inflated without focal consolidation or effusion. Cardiomediastinal silhouette and remainder of the exam is unchanged. IMPRESSION: No active cardiopulmonary disease. Electronically Signed   By: Marin Olp M.D.   On: 03/07/2016 20:09   Ct Angio Chest/abd/pel For Dissection W And/or W/wo  Result Date: 03/08/2016 CLINICAL DATA:  61 year old female with chest tightness. History of type 1 aortic dissection repair. EXAM: CT ANGIOGRAPHY CHEST, ABDOMEN AND PELVIS TECHNIQUE: Multidetector CT imaging through the chest, abdomen and pelvis was performed using the standard protocol during bolus administration of intravenous contrast. Multiplanar reconstructed images and MIPs were obtained and reviewed to evaluate the vascular anatomy. CONTRAST:  80 cc Isovue 370 COMPARISON:  Chest radiograph dated 03/07/2016 and CT dated 02/21/2015 FINDINGS: CTA CHEST FINDINGS Cardiovascular: There is a stable moderate cardiomegaly. No pericardial effusion. Type A aortic dissection again noted with postsurgical changes of repair of the root of the aorta. The  ascending aortic graft appears unremarkable and similar to the prior radiograph. There is no extravasation of contrast. There is contrast in both true and false lumen. There is extension of the dissection flap into the proximal portion of the right innominate artery. This findings are similar to prior CT. The origins of the great vessels of the aortic arch appear to arise from the true lumen. The central pulmonary arteries appear unremarkable and patent. There is however a small pulmonary embolus in the subsegmental branch point of the right lower lobe (series 601 image 83 and coronal series 602, image 105). This clot appears nonocclusive and may be chronic in nature and represent scarring. Correlation with clinical exam and history of prior TS recommended. Mediastinum/Nodes: There is no hilar or mediastinal adenopathy. The esophagus is grossly unremarkable. Lungs/Pleura: The lungs are clear. There is no pleural effusion or pneumothorax. The central airways are patent. Musculoskeletal: Median sternotomy wires. No acute osseous pathology. Review of the MIP images confirms the above findings. CTA ABDOMEN AND PELVIS FINDINGS VASCULAR Aorta: Type A dissection extends just above the aortic bifurcation. There is contrast in the false lumen although appears less dense  compared to the contrast within the true lumen. Celiac: The origin of the celiac axis arises from the true lumen and appears patent. SMA: The origin of the SMA appears patent and arises from the true lumen. Renals: The right renal artery appears patent and arises from the true lumen. The left renal artery is patent as well. The origin of the left renal artery is not seen with certainty. IMA: The IMA is patent and arises from the true lumen. Inflow: Mild atherosclerotic calcification of the common iliac arteries. The common iliac external and internal iliac arteries are patent. Veins: No obvious venous abnormality within the limitations of this arterial phase  study. Review of the MIP images confirms the above findings. No intra-abdominal free air.  Trace fluid within the pelvis NON-VASCULAR Hepatobiliary: Cholecystectomy. The liver is unremarkable. No intrahepatic biliary ductal dilatation. Pancreas: Unremarkable. No pancreatic ductal dilatation or surrounding inflammatory changes. Spleen: Normal in size without focal abnormality. Adrenals/Urinary Tract: Stable 2.2 cm left adrenal adenoma. The right adrenal gland is unremarkable. There is left renal lower pole atrophy and scarring. The right kidney is unremarkable. There is no hydronephrosis on either side. The visualized ureters and urinary bladder appear unremarkable. Stomach/Bowel: There is colonic diverticulosis without active inflammatory changes. Moderate amount of stool noted throughout the colon. There is no evidence of bowel obstruction or active inflammation. The appendix is not visualized with certainty. No inflammatory changes identified in the right lower quadrant. Lymphatic: No adenopathy. Reproductive: Hysterectomy. Other: None Musculoskeletal: Degenerate changes of the spine. No acute fracture. Review of the MIP images confirms the above findings. IMPRESSION: 1. Stable appearing type A aortic dissection status post prior repair of the aortic root. Overall the appearance of the aorta and dissection is similar to the prior study. No periaortic inflammation or extravasation of contrast. 2. Small pulmonary embolism involving the subsegmental branch point of the right lower lobe. This may represent an acute nonocclusive PE versus chronic scarring. Correlation with clinical exam and history of prior PE recommended. 3. Moderate cardiomegaly similar to prior CT. 4. Colonic diverticulosis without active inflammatory changes. No bowel obstruction. 5. Trace free fluid within the pelvis of indeterminate etiology. These results were called by telephone at the time of interpretation on 03/08/2016 at 3:01 am to Dr. Tennis Must , who verbally acknowledged these results. Electronically Signed   By: Anner Crete M.D.   On: 03/08/2016 03:08       Subjective:   Discharge Exam: Vitals:   03/08/16 0740 03/08/16 1116  BP: 119/72 (!) 120/57  Pulse: 63 (!) 53  Resp: 20 15  Temp: 98.3 F (36.8 C)    Vitals:   03/08/16 0230 03/08/16 0300 03/08/16 0740 03/08/16 1116  BP: 119/68 (!) 147/72 119/72 (!) 120/57  Pulse: 64 69 63 (!) 53  Resp: 16  20 15   Temp:  98.4 F (36.9 C) 98.3 F (36.8 C)   TempSrc:  Oral Oral   SpO2: 97%  95% 100%  Weight:  87.7 kg (193 lb 6.4 oz)    Height:  5\' 3"  (1.6 m)      General: Pt is alert, awake, not in acute distress Cardiovascular: RRR, S1/S2 +, no rubs, no gallops Respiratory: CTA bilaterally, no wheezing, no rhonchi Abdominal: Soft, NT, ND, bowel sounds + Extremities: no edema, no cyanosis    The results of significant diagnostics from this hospitalization (including imaging, microbiology, ancillary and laboratory) are listed below for reference.     Microbiology: Recent Results (from the past  240 hour(s))  MRSA PCR Screening     Status: None   Collection Time: 03/08/16  3:29 AM  Result Value Ref Range Status   MRSA by PCR NEGATIVE NEGATIVE Final    Comment:        The GeneXpert MRSA Assay (FDA approved for NASAL specimens only), is one component of a comprehensive MRSA colonization surveillance program. It is not intended to diagnose MRSA infection nor to guide or monitor treatment for MRSA infections.      Labs: BNP (last 3 results)  Recent Labs  03/08/16 0405  BNP [id number]   Basic Metabolic Panel:  Recent Labs Lab 03/07/16 1853 03/08/16 0148  NA 139  --   K 3.7  --   CL 105  --   CO2 24  --   GLUCOSE 105*  --   BUN 11  --   CREATININE 1.01* 0.94  CALCIUM 9.1  --    Liver Function Tests: No results for input(s): AST, ALT, ALKPHOS, BILITOT, PROT, ALBUMIN in the last 168 hours. No results for input(s): LIPASE, AMYLASE in the last  168 hours. No results for input(s): AMMONIA in the last 168 hours. CBC:  Recent Labs Lab 03/07/16 1853  WBC 6.9  HGB 12.8  HCT 39.7  MCV 70.9*  PLT 200   Cardiac Enzymes:  Recent Labs Lab 03/08/16 0404  TROPONINI <0.03   BNP: Invalid input(s): POCBNP CBG: No results for input(s): GLUCAP in the last 168 hours. D-Dimer No results for input(s): DDIMER in the last 72 hours. Hgb A1c No results for input(s): HGBA1C in the last 72 hours. Lipid Profile No results for input(s): CHOL, HDL, LDLCALC, TRIG, CHOLHDL, LDLDIRECT in the last 72 hours. Thyroid function studies No results for input(s): TSH, T4TOTAL, T3FREE, THYROIDAB in the last 72 hours.  Invalid input(s): FREET3 Anemia work up No results for input(s): VITAMINB12, FOLATE, FERRITIN, TIBC, IRON, RETICCTPCT in the last 72 hours. Urinalysis    Component Value Date/Time   COLORURINE YELLOW 06/07/2015 [place] 06/07/2015 1526   LABSPEC 1.020 06/07/2015 1526   PHURINE 5.5 06/07/2015 1526   GLUCOSEU NEGATIVE 06/07/2015 1526   GLUCOSEU NEGATIVE 07/09/2011 0743   HGBUR NEGATIVE 06/07/2015 1526   [place] 06/07/2015 1526   BILIRUBINUR neg 09/30/2012 1438   [place] 06/07/2015 1526   PROTEINUR 1+ (A) 06/07/2015 1526   UROBILINOGEN negative 09/30/2012 1438   UROBILINOGEN 0.2 04/12/2012 0720   NITRITE NEGATIVE 06/07/2015 1526   LEUKOCYTESUR 3+ (A) 06/07/2015 1526   Sepsis Labs Invalid input(s): PROCALCITONIN,  WBC,  LACTICIDVEN Microbiology Recent Results (from the past 240 hour(s))  MRSA PCR Screening     Status: None   Collection Time: 03/08/16  3:29 AM  Result Value Ref Range Status   MRSA by PCR NEGATIVE NEGATIVE Final    Comment:        The GeneXpert MRSA Assay (FDA approved for NASAL specimens only), is one component of a comprehensive MRSA colonization surveillance program. It is not intended to diagnose MRSA infection nor to guide or monitor treatment for MRSA  infections.      Time coordinating discharge: Over 30 minutes  SIGNED:   Damita Lack, MD  [facility] 03/08/2016, 2:56 PM Pager   If 7PM-7AM, please contact night-coverage [email or web address] Password [email or web address]

## 2016-03-12 ENCOUNTER — Ambulatory Visit (INDEPENDENT_AMBULATORY_CARE_PROVIDER_SITE_OTHER): Payer: Medicaid Other | Admitting: Cardiothoracic Surgery

## 2016-03-12 ENCOUNTER — Encounter: Payer: Self-pay | Admitting: Cardiothoracic Surgery

## 2016-03-12 ENCOUNTER — Other Ambulatory Visit: Payer: Medicaid Other

## 2016-03-12 VITALS — BP 140/76 | HR 64 | Resp 20 | Ht 63.0 in | Wt 195.0 lb

## 2016-03-12 DIAGNOSIS — I71019 Dissection of thoracic aorta, unspecified: Secondary | ICD-10-CM

## 2016-03-12 DIAGNOSIS — I7101 Dissection of thoracic aorta: Secondary | ICD-10-CM | POA: Diagnosis not present

## 2016-03-12 NOTE — Progress Notes (Signed)
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 SURGICAL PROCEDURES: 1. Replacement of ascending aorta. 2. Resuspension of aortic valve  3. Right axillary artery cannulation and hypothermic circulatory circulatory arrest. SURGEON: Lanelle Bal, MD.  History of Present Illness:     Patient returns to the office following replacement of ascending aorta and resuspension air valve for a type I aortic dissection. Initially postop she had significant problems with blood pressure control. The patient had a recent admission [facility] for shortness of breath and chest discomfort. She was admitted and ultimately discharged home after 24 hours . There was some discussion about whether the patient had a pulmonary embolus based on CT scan but evidently this was not definitely diagnostic and ultimately she was discharged home not on anticoagulation. She returns today for her regular yearly CT of the chest to evaluate her aortic dissection.   Since she had a CT scan last week in the hospital we did not repeat it.     Past Medical History:  Diagnosis Date  . Arthritis    right knee  . CAD (coronary artery disease)    a. 04/2015: cath showed 30% Ost RCA stenosis, 20% mid-LAD stenosis, and normal LV function.   . Congestive dilated cardiomyopathy ([place]) 08/01/2014  . GERD   . HEMATURIA UNSPECIFIED   . History of kidney stones   . HYPERLIPIDEMIA   . HYPERTENSION   . Morbid obesity ([place])   . MYOSITIS   . Sleep apnea    cpap-  doesnt  use it- last study years ago  . Vertigo      History  Smoking Status  . Never Smoker  Smokeless Tobacco  . 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 Hydralazine Nausea Only  . Motrin [Ibuprofen] Nausea Only    Upset GI (motrin brand causes the side effect)    Current Outpatient Prescriptions  Medication Sig Dispense Refill  . amLODipine (NORVASC) 10 MG tablet Take 1 tablet (10 mg total) by mouth daily. 30 tablet 11  . aspirin EC 81 MG EC tablet Take 1 tablet (81 mg total) by mouth daily.    [name] atorvastatin (LIPITOR) 40 MG tablet Take 1 tablet (40 mg total) by mouth daily at 6 PM. 90 tablet 3  . carvedilol (COREG) 12.5 MG tablet Take 1 tablet (12.5 mg total) by mouth 2 (two) times daily. 180 tablet 3  . Cholecalciferol 1000 units tablet Take 1,000 Units by mouth daily.    . folic acid (FOLVITE) 1 MG tablet TAKE 1 TABLET BY MOUTH DAILY. 30 tablet 6  . lisinopril (PRINIVIL,ZESTRIL) 40 MG tablet Take 1 tablet (40 mg total) by mouth daily. 30 tablet 11  . spironolactone (ALDACTONE) 50 MG tablet Take 1 tablet (50 mg total) by mouth daily. 30 tablet 6  . vitamin  E 400 UNIT capsule Take 400 Units by mouth daily.     No current facility-administered medications for this visit.        Physical Exam: BP 140/76   Pulse 64   Resp 20   Ht 5\' 3"  (1.6 m)   Wt 195 lb (88.5 kg)   SpO2 99% Comment: RA  BMI 34.54 kg/m   General appearance: alert and cooperative Neurologic: intact Heart: regular rate and rhythm, S1, S2 normal, no murmur, click, rub or gallop, no AI Lungs: clear to auscultation bilaterally Abdomen: soft, non-tender; bowel sounds normal; no masses,  no organomegaly Extremities: extremities normal, atraumatic, no cyanosis or edema and Homans sign is negative, no sign of DVT Wound: sternum stable right infraclavicular incision is  also well healed No carotid bruits   Diagnostic Studies & Laboratory data:     Recent Radiology Findings:   Dg Chest 2 View  Result Date: 03/07/2016 CLINICAL DATA:  Chest pain with slight shortness of breath since yesterday. Repair of ascending aorta 2016. EXAM: CHEST  2 VIEW COMPARISON:  07/12/2014 and chest CT 02/21/2015 FINDINGS: Sternotomy wires unchanged. Lungs are adequately inflated without focal consolidation or effusion. Cardiomediastinal silhouette and remainder of the exam is unchanged. IMPRESSION: No active cardiopulmonary disease. Electronically Signed   By: Marin Olp M.D.   On: 03/07/2016 20:09   Ct Angio Chest/abd/pel For Dissection W And/or W/wo  Result Date: 03/08/2016 CLINICAL DATA:  61 year old female with chest tightness. History of type 1 aortic dissection repair. EXAM: CT ANGIOGRAPHY CHEST, ABDOMEN AND PELVIS TECHNIQUE: Multidetector CT imaging through the chest, abdomen and pelvis was performed using the standard protocol during bolus administration of intravenous contrast. Multiplanar reconstructed images and MIPs were obtained and reviewed to evaluate the vascular anatomy. CONTRAST:  80 cc Isovue 370 COMPARISON:  Chest radiograph dated 03/07/2016 and CT dated 02/21/2015 FINDINGS: CTA CHEST FINDINGS Cardiovascular: There is a stable moderate cardiomegaly. No pericardial effusion. Type A aortic dissection again noted with postsurgical changes of repair of the root of the aorta. The ascending aortic graft appears unremarkable and similar to the prior radiograph. There is no extravasation of contrast. There is contrast in both true and false lumen. There is extension of the dissection flap into the proximal portion of the right innominate artery. This findings are similar to prior CT. The origins of the great vessels of the aortic arch appear to arise from the true lumen. The central pulmonary arteries appear unremarkable and patent. There is however a small pulmonary embolus  in the subsegmental branch point of the right lower lobe (series 601 image 83 and coronal series 602, image 105). This clot appears nonocclusive and may be chronic in nature and represent scarring. Correlation with clinical exam and history of prior TS recommended. Mediastinum/Nodes: There is no hilar or mediastinal adenopathy. The esophagus is grossly unremarkable. Lungs/Pleura: The lungs are clear. There is no pleural effusion or pneumothorax. The central airways are patent. Musculoskeletal: Median sternotomy wires. No acute osseous pathology. Review of the MIP images confirms the above findings. CTA ABDOMEN AND PELVIS FINDINGS VASCULAR Aorta: Type A dissection extends just above the aortic bifurcation. There is contrast in the false lumen although appears less dense compared to the contrast within the true lumen. Celiac: The origin of the celiac axis arises from the true lumen and appears patent. SMA: The origin of the SMA appears patent and arises from the true lumen. Renals: The right renal artery appears patent and arises from the true lumen. The  left renal artery is patent as well. The origin of the left renal artery is not seen with certainty. IMA: The IMA is patent and arises from the true lumen. Inflow: Mild atherosclerotic calcification of the common iliac arteries. The common iliac external and internal iliac arteries are patent. Veins: No obvious venous abnormality within the limitations of this arterial phase study. Review of the MIP images confirms the above findings. No intra-abdominal free air.  Trace fluid within the pelvis NON-VASCULAR Hepatobiliary: Cholecystectomy. The liver is unremarkable. No intrahepatic biliary ductal dilatation. Pancreas: Unremarkable. No pancreatic ductal dilatation or surrounding inflammatory changes. Spleen: Normal in size without focal abnormality. Adrenals/Urinary Tract: Stable 2.2 cm left adrenal adenoma. The right adrenal gland is unremarkable. There is left renal  lower pole atrophy and scarring. The right kidney is unremarkable. There is no hydronephrosis on either side. The visualized ureters and urinary bladder appear unremarkable. Stomach/Bowel: There is colonic diverticulosis without active inflammatory changes. Moderate amount of stool noted throughout the colon. There is no evidence of bowel obstruction or active inflammation. The appendix is not visualized with certainty. No inflammatory changes identified in the right lower quadrant. Lymphatic: No adenopathy. Reproductive: Hysterectomy. Other: None Musculoskeletal: Degenerate changes of the spine. No acute fracture. Review of the MIP images confirms the above findings. IMPRESSION: 1. Stable appearing type A aortic dissection status post prior repair of the aortic root. Overall the appearance of the aorta and dissection is similar to the prior study. No periaortic inflammation or extravasation of contrast. 2. Small pulmonary embolism involving the subsegmental branch point of the right lower lobe. This may represent an acute nonocclusive PE versus chronic scarring. Correlation with clinical exam and history of prior PE recommended. 3. Moderate cardiomegaly similar to prior CT. 4. Colonic diverticulosis without active inflammatory changes. No bowel obstruction. 5. Trace free fluid within the pelvis of indeterminate etiology. These results were called by telephone at the time of interpretation on 03/08/2016 at 3:01 am to Dr. Tennis Must , who verbally acknowledged these results. Electronically Signed   By: Anner Crete M.D.   On: 03/08/2016 03:08    Ct Angio Chest Aorta W/cm &/or Wo/cm  02/21/2015  CLINICAL DATA:  History of aortic dissection with prior surgeon EXAM: CT ANGIOGRAPHY CHEST CT ABDOMEN AND PELVIS WITH CONTRAST TECHNIQUE: Multidetector CT imaging of the chest was performed using the standard protocol during bolus administration of intravenous contrast. Multiplanar CT image reconstructions and MIPs were  obtained to evaluate the vascular anatomy. Multidetector CT imaging of the abdomen and pelvis was performed using the standard protocol during bolus administration of intravenous contrast. CONTRAST:  75 mL Isovue 370 COMPARISON:  None. FINDINGS: CTA CHEST FINDINGS There are changes consistent with the patient's known history of type 1 aortic dissection with interval surgical repair of the ascending aorta. The ascending tube graft appears well placed without extravasation. Flow into both the false and true lumens is noted. The dissection flap extends into the proximal portion of the innominate artery on the right similar that seen on the prior exam. The false lumen again supplies flow to the left common carotid artery and left subclavian artery. The lungs are well aerated bilaterally. No sizable hilar or mediastinal adenopathy is noted. Bilateral thyroid nodules are again noted stable. No bony abnormality is noted. CT ABDOMEN and PELVIS FINDINGS The liver demonstrates some diffuse decreased attenuation consistent with fatty infiltration. The gallbladder has been surgically removed. The spleen, pancreas and right adrenal gland are within normal limits. A hypodense lesion  is noted arising from the left adrenal gland which is stable in appearance likely representing an adenoma. The right kidney demonstrates a normal enhancement pattern. The left kidney demonstrates decreased enhancement similar to that seen on the prior exam. The dominant left renal artery is noted arising from the true lumen and occluding just beyond its origin. An accessory left renal artery is noted arising from the false lumen and remains patent. Relative sparing of the lower pole of the left kidney is noted. A large left renal pelvis stone as well as a smaller lower pole stone are again identified and stable. The bladder is partially distended. No pelvic mass lesion is noted. Scattered diverticular change of the colon is noted. No acute bony  abnormality is noted. Vascular: The false lumen of the dissection again supplies the celiac axis, superior mesenteric artery, right renal artery and inferior mesenteric artery. Flow to these vessels is widely patent. The dissection flap extends to just above the aortic bifurcation. Some fenestrations in the distal flap are noted. Flow via the false lumen into the iliac arteries is noted bilaterally. The previously seen extension of the dissection into the left common iliac artery is no longer identified. Review of the MIP images confirms the above findings. 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 The tube graft is widely patent with flow into both the true and false lumens. The dissection flap terminates just above the aortic bifurcation with improved flow into the left iliac artery when compared with the prior exam. No new focal dissection or abnormality is noted. Visceral flow is similar to that noted on the prior exam. Left renal calculi without obstructive change. 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 No acute abnormality is noted. Electronically Signed   By: Inez Catalina M.D.   On: 02/21/2015 13:39      Recent Lab Findings: Lab Results  Component Value Date   WBC 6.9 03/07/2016   HGB 12.8 03/07/2016   HCT 39.7 03/07/2016   PLT 200 03/07/2016   GLUCOSE 105 (H) 03/07/2016   CHOL 155 12/24/2015   TRIG 133 12/24/2015   HDL 53 12/24/2015   LDLCALC 75 12/24/2015   ALT 12 12/24/2015   AST 12 12/24/2015   NA 139 03/07/2016   K 3.7 03/07/2016   CL 105 03/07/2016   CREATININE 0.94 03/08/2016   BUN 11 03/07/2016   CO2 24 03/07/2016   TSH 0.78 03/08/2015   INR 0.94 04/26/2015   HGBA1C 5.8 (H) 03/07/2014   ECHO: Study Conclusions  - Left ventricle: The cavity size was normal. There was mild focal basal  hypertrophy of the septum. Systolic function was normal. The estimated ejection fraction was in the range of 55% to 60%. Wall motion was normal; there were no regional wall motion abnormalities. There was an increased relative contribution of atrial contraction to ventricular filling. Doppler parameters are consistent with abnormal left ventricular relaxation (grade 1 diastolic dysfunction). - Aortic valve: Trileaflet; normal thickness, mildly calcified leaflets. There was trivial regurgitation. - Aorta: Aortic root dimension: 42 mm (ED). - Aortic root: The aortic root was mildly dilated. - Mitral valve: There was trivial regurgitation.   Assessment / Plan:   1/Stable following acute aortic1 dissection repair, with resuspension of the aortic valve, no murmur 2/Severe hypertension remains better controlled, now has no primary care ,no  see new primary care in March 3/Plan to see back in February 2018  approximate 1 year postop with a follow-up CTA of the chest abdomen pelvis.  According to the 2010 ACC/AHA guidelines, we recommend patients with thoracic aortic disease to maintain a LDL of less than 70 and a HDL of greater than 50. We recommend their blood pressure to remain less than 135/85.     Grace Isaac MD      [address] [place] [address] Office [phone]   Beeper [phone]  03/12/2016 11:39 AM

## 2016-03-24 ENCOUNTER — Other Ambulatory Visit: Payer: Self-pay | Admitting: Physician Assistant

## 2016-03-24 DIAGNOSIS — I420 Dilated cardiomyopathy: Secondary | ICD-10-CM

## 2016-03-24 MED FILL — FOLIC ACID 1 MG TABLET: 1 | 30 days supply | Qty: 30 | Fill #4

## 2016-03-24 MED FILL — LISINOPRIL 40 MG TABLET: 40 | 30 days supply | Qty: 30 | Fill #9

## 2016-03-24 MED FILL — ?AMLODIPINE BESYLATE 10 MG: 10 | 30 days supply | Qty: 30 | Fill #9

## 2016-03-26 ENCOUNTER — Ambulatory Visit: Payer: Medicaid Other | Admitting: Nurse Practitioner

## 2016-04-15 ENCOUNTER — Telehealth: Payer: Self-pay | Admitting: Nurse Practitioner

## 2016-04-15 MED ORDER — OMEPRAZOLE 20 MG PO TBEC: 20.0000 mg | DELAYED_RELEASE_TABLET | Freq: Every day | ORAL | 9 refills | Status: DC

## 2016-04-15 NOTE — Telephone Encounter (Signed)
s/w pt stated has bloating, diahrrea, and gas for 3 weeks.  Was put on omeprazole by a stomach doctor,  pt does not remember the name of the doctor. Felt better, symptoms stopped, pt went off medication.  Pt wanted script for omeprazole stated would have to call the stomach doctor to reorder medication. Stated it was OTC and pt will get some and start taking again. Confirmed with Cecille Rubin that pt was ok to restart this medication.  Pt also tried to get in with Dr. Marlou Sa for PCP, not excepting new medicaid pt's.  Will mail list to pt with other PCP's that are excepting pt's, in the mail today. Confirmed pt's address.

## 2016-04-15 NOTE — Telephone Encounter (Signed)
New Message     She is having problems with her stomach, what can she take that wont interfere with her medication,

## 2016-04-17 ENCOUNTER — Encounter: Payer: Self-pay | Admitting: Nurse Practitioner

## 2016-04-29 ENCOUNTER — Other Ambulatory Visit: Payer: Self-pay | Admitting: Nurse Practitioner

## 2016-04-29 MED FILL — LISINOPRIL 40 MG TABLET: 40 | 30 days supply | Qty: 30 | Fill #0

## 2016-04-29 MED FILL — AMLODIPINE BESYLATE 10 MG T: 10 | 30 days supply | Qty: 30 | Fill #0

## 2016-04-29 MED FILL — FOLIC ACID 1 MG TABLET: 1 | 30 days supply | Qty: 30 | Fill #5

## 2016-04-29 MED FILL — ?ATORVASTATIN 40MG TABLET: 40 | 30 days supply | Qty: 30 | Fill #2

## 2016-05-05 ENCOUNTER — Ambulatory Visit (INDEPENDENT_AMBULATORY_CARE_PROVIDER_SITE_OTHER): Payer: Medicaid Other | Admitting: Nurse Practitioner

## 2016-05-05 ENCOUNTER — Encounter: Payer: Self-pay | Admitting: Nurse Practitioner

## 2016-05-05 ENCOUNTER — Encounter (INDEPENDENT_AMBULATORY_CARE_PROVIDER_SITE_OTHER): Payer: Self-pay

## 2016-05-05 VITALS — BP 140/90 | HR 63 | Ht 63.0 in | Wt 199.0 lb

## 2016-05-05 DIAGNOSIS — I10 Essential (primary) hypertension: Secondary | ICD-10-CM | POA: Diagnosis not present

## 2016-05-05 DIAGNOSIS — Z9889 Other specified postprocedural states: Secondary | ICD-10-CM

## 2016-05-05 NOTE — Patient Instructions (Addendum)
We will be checking the following labs today - NONE   Medication Instructions:    Continue with your current medicines.     Testing/Procedures To Be Arranged:  N/A  Follow-Up:   See me in 4 months    Other Special Instructions:   N/A    If you need a refill on your cardiac medications before your next appointment, please call your pharmacy.   Call the [facility] office at [phone] if you have any questions, problems or concerns.      

## 2016-05-05 NOTE — Progress Notes (Signed)
CARDIOLOGY OFFICE NOTE  Date:  05/05/2016    Alexis Wheeler Date of Birth: 05-Dec-1955 Medical Record #[id number]  PCP:  Truitt Merle, NP  Cardiologist:  Rosanne Sack  Chief Complaint  Patient presents with  . Hypertension    Follow up visit - seen for Dr. Radford Pax    History of Present Illness: Alexis Wheeler is a 61 y.o. female who presents today for a follow up visit. Seen for Dr. Radford Pax.   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 She underwent replacement of the ascending aorta with resuspension of the AV with 30 mm Hemashield Platinum graft with R axillary artery cannulation by Dr. Servando Snare. EF was 40-45% post op. Follow up echo from 07/2014 showed improvement of her EF - now at 55 to 60%.   Admitted back in December of 2016 with uncontrolled HTN. Medicines were all changed. She had stopped some.  She was referred back here for management of BP. Did not have PCP. She was placed on spironolactone and changed her atenolol to carvedilol.   I then sawher back several times - BP still too high. Did not tolerate Hydralazine. Complained of what sounded like exertional fatigue - she was not cathed prior to her AAA dissection and I was worried about CAD. Myoview was obtained - turned out to be high risk - was referred for cath - this was reassuring - only with very mild CAD that is nonobstructive. When seen back in May of 2017 was having what sounded like pain from kidney stones. Ended up seeing GU.   I last saw her in December - money was a big issue. Medicines a little mixed up - on 2 beta blockers and not clear why - got that straightened out and increased the dose of the Coreg for better BP control. She was otherwise felt to be doing ok. On follow up in January she was doing ok. Trying to get a new PCP but having difficulty with this.   Back in  February she got a CT angio when admitted with chest pain -- possible small PE versus scarring noted - she did not wish to be on blood thinner. No DVT on doppler study. She was not short of breath and clinically did not fit the picture for a PE. She has not been short of breath.   Comes back today. Here alone. Pretty jovial but upset - almost had a wreck before getting her. BP better at home - down in the 130's/80's. Not dizzy or lightheaded. She admits she is not doing much - weight is up. Not dizzy or lightheaded. She still has these weak spells - not clear what the etiology this is. She says she is planning on trying to diet. Overall, she feels like she is ok.   Past Medical History:  Diagnosis Date  . Arthritis    right knee  . CAD (coronary artery disease)    a. 04/2015: cath showed 30% Ost RCA stenosis, 20% mid-LAD stenosis, and normal LV function.   . Congestive dilated cardiomyopathy ([place]) 08/01/2014  . GERD   . HEMATURIA UNSPECIFIED   . History of kidney stones   . HYPERLIPIDEMIA   . HYPERTENSION   . Morbid obesity ([place])   . MYOSITIS   . Sleep apnea    cpap-  doesnt use it-  last study years ago  . Vertigo     Past Surgical History:  Procedure Laterality Date  . ABDOMINAL HYSTERECTOMY  1983  . CARDIAC CATHETERIZATION N/A 04/30/2015   Procedure: Left Heart Cath and Coronary Angiography;  Surgeon: Peter M Martinique, MD;  Location: [facility];  Service: Cardiovascular;  Laterality: N/A;  . CHOLECYSTECTOMY  2001  . CYSTOSCOPY W/ RETROGRADES Left 08/02/2015   Procedure: CYSTOSCOPY WITH LEFT RETROGRADE PYELOGRAM;  Surgeon: Festus Aloe, MD;  Location: [facility];  Service: Urology;  Laterality: Left;  . CYSTOSCOPY WITH URETEROSCOPY, STONE BASKETRY AND STENT PLACEMENT Left 08/02/2015   Procedure:  Mitzi Davenport ;  Surgeon: Festus Aloe, MD;  Location: [facility];  Service: Urology;  Laterality: Left;  . CYSTOSCOPY/URETEROSCOPY/HOLMIUM LASER/STENT PLACEMENT Left 08/02/2015    Procedure: CYSTOSCOPY/URETEROSCOPY/HOLMIUM LASER/STENT PLACEMENT-LEFT;  Surgeon: Festus Aloe, MD;  Location: [facility];  Service: Urology;  Laterality: Left;  . HOLMIUM LASER APPLICATION Left 3/50/0938   Procedure: HOLMIUM LASER APPLICATION;  Surgeon: Festus Aloe, MD;  Location: [facility];  Service: Urology;  Laterality: Left;  . REPLACEMENT ASCENDING AORTA N/A 03/03/2014   Procedure: REPAIR OF ASCENDING AORTIC DISSECTION;  Surgeon: Grace Isaac, MD;  Location: [facility];  Service: Open Heart Surgery;  Laterality: N/A;     Medications: Current Outpatient Prescriptions  Medication Sig Dispense Refill  . amLODipine (NORVASC) 10 MG tablet TAKE 1 TABLET BY MOUTH DAILY 30 tablet 8  . aspirin EC 81 MG EC tablet Take 1 tablet (81 mg total) by mouth daily.    [name] atorvastatin (LIPITOR) 40 MG tablet Take 1 tablet (40 mg total) by mouth daily at 6 PM. 90 tablet 3  . carvedilol (COREG) 12.5 MG tablet Take 1 tablet (12.5 mg total) by mouth 2 (two) times daily. 60 tablet 6  . Cholecalciferol 1000 units tablet Take 1,000 Units by mouth daily.    . folic acid (FOLVITE) 1 MG tablet TAKE 1 TABLET BY MOUTH DAILY. 30 tablet 6  . lisinopril (PRINIVIL,ZESTRIL) 40 MG tablet TAKE 1 TABLET BY MOUTH DAILY. 30 tablet 8  . Omeprazole 20 MG TBEC Take 1 tablet (20 mg total) by mouth daily. 30 each 9  . spironolactone (ALDACTONE) 50 MG tablet Take 1 tablet (50 mg total) by mouth daily. 30 tablet 5  . vitamin E 400 UNIT capsule Take 400 Units by mouth daily.    . carvedilol (COREG) 12.5 MG tablet Take 1 tablet (12.5 mg total) by mouth 2 (two) times daily. 180 tablet 3   No current facility-administered medications for this visit.     Allergies: Allergies  Allergen Reactions  . Hydralazine Nausea Only  . Motrin [Ibuprofen] Nausea Only    Upset GI (motrin brand causes the side effect)    Social History: The patient  reports that she has never smoked. She has never used smokeless tobacco. She reports that she  does not drink alcohol or use drugs.   Family History: The patient's family history includes Aortic dissection (age of onset: 45) in her mother; Breast cancer in her sister; Breast cancer (age of onset: 59) in her mother; Hypertension in her daughter, father, mother, and sister; Throat cancer in her brother.   Review of Systems: Please see the history of present illness.   Otherwise, the review of systems is positive for none.   All other systems are reviewed and negative.   Physical Exam: VS:  BP 140/90 (BP Location: Left Arm, Patient Position: Sitting, Cuff Size: Large)   Pulse 63  Ht 5\' 3"  (1.6 m)   Wt 199 lb (90.3 kg)   SpO2 99% Comment: at rest  BMI 35.25 kg/m  .  BMI Body mass index is 35.25 kg/m.  Wt Readings from Last 3 Encounters:  05/05/16 199 lb (90.3 kg)  03/12/16 195 lb (88.5 kg)  03/08/16 193 lb 6.4 oz (87.7 kg)    General: Pleasant. Well developed, well nourished and in no acute distress.   HEENT: Normal.  Neck: Supple, no JVD, carotid bruits, or masses noted.  Cardiac: Regular rate and rhythm. No murmurs, rubs, or gallops. No edema.  Respiratory:  Lungs are clear to auscultation bilaterally with normal work of breathing.  GI: Soft and nontender.  MS: No deformity or atrophy. Gait and ROM intact.  Skin: Warm and dry. Color is normal.  Neuro:  Strength and sensation are intact and no gross focal deficits noted.  Psych: Alert, appropriate and with normal affect.   LABORATORY DATA:  EKG:  EKG is not ordered today.  Lab Results  Component Value Date   WBC 6.9 03/07/2016   HGB 12.8 03/07/2016   HCT 39.7 03/07/2016   PLT 200 03/07/2016   GLUCOSE 105 (H) 03/07/2016   CHOL 155 12/24/2015   TRIG 133 12/24/2015   HDL 53 12/24/2015   LDLCALC 75 12/24/2015   ALT 12 12/24/2015   AST 12 12/24/2015   NA 139 03/07/2016   K 3.7 03/07/2016   CL 105 03/07/2016   CREATININE 0.94 03/08/2016   BUN 11 03/07/2016   CO2 24 03/07/2016   TSH 0.78 03/08/2015   INR 0.94  04/26/2015   HGBA1C 5.8 (H) 03/07/2014    BNP (last 3 results)  Recent Labs  03/08/16 0405  BNP 16.9    ProBNP (last 3 results) No results for input(s): PROBNP in the last 8760 hours.   Other Studies Reviewed Today:  Cardiac Cath Procedures 04/2015   Left Heart Cath and Coronary Angiography    Conclusion    Ost RCA to Prox RCA lesion, 30% stenosed.  Mid LAD lesion, 20% stenosed.  The left ventricular systolic function is normal.  1. Mild nonobstructive CAD 2. Normal LV function.  Plan: medical management.       Myoview Study Highlights4/2017    The left ventricular ejection fraction is moderately decreased (30-44%).  Nuclear stress EF: 42%.  Defect 1: There is a large defect of moderate severity.  Findings consistent with ischemia.  No T wave inversion was noted during stress.  There was no ST segment deviation noted during stress.  Large size, moderate severity reversible (SDS 9) inferolateral perfusion defect suggestive of ischemia. LVEF 42% with inferolateral hypokinesis. This is a high risk study based on the extent of ischemia.    Echo Study Conclusions from 07/2014  - Left ventricle: The cavity size was normal. There was mild focal  basal hypertrophy of the septum. Systolic function was normal.  The estimated ejection fraction was in the range of 55% to 60%.  Wall motion was normal; there were no regional wall motion  abnormalities. There was an increased relative contribution of  atrial contraction to ventricular filling. Doppler parameters are  consistent with abnormal left ventricular relaxation (grade 1  diastolic dysfunction). - Aortic valve: Trileaflet; normal thickness, mildly calcified  leaflets. There was trivial regurgitation. - Aorta: Aortic root dimension: 42 mm (ED). - Aortic root: The aortic root was mildly dilated. - Mitral valve: There was trivial regurgitation.  Assessment/Plan:  1. HTN - Has  better  BP control at home. I have left her on her current regimen. See back in 4 months.   2. HLD - on statin - labs from December noted.   3. Prior aortic dissection repair - she sees Dr. Servando Snare annually  4. Exertional Fatigue - had high risk Myoview - but with reassuring cardiac cath findings. I suspect some of this is deconditioning.   5. Iron deficiency anemia - back on Folic acid.   6. Prior dilated CM - last echo showed EF had recovered but was lower by recent Myoview - yet normal at time of cardiac cath - she looks good clinically and I have left her on her current regimen.  7. Poor social situation - having lots of difficulty getting PCP. Going on Medicare in August - maybe then she will have better luck.          Current medicines are reviewed with the patient today.  The patient does not have concerns regarding medicines other than what has been noted above.  The following changes have been made:  See above.  Labs/ tests ordered today include:   No orders of the defined types were placed in this encounter.    Disposition:   FU with me in 4 months.   Patient is agreeable to this plan and will call if any problems develop in the interim.   SignedTruitt Merle, NP  05/05/2016 4:09 PM  [facility] [address] [place], [place]  [address] Phone: [phone] Fax: [phone]

## 2016-06-09 ENCOUNTER — Other Ambulatory Visit: Payer: Self-pay | Admitting: Nurse Practitioner

## 2016-06-09 MED FILL — FOLIC ACID 1 MG TABLET: 1 | 30 days supply | Qty: 30 | Fill #6

## 2016-06-09 MED FILL — LISINOPRIL 40 MG TABLET: 40 | 30 days supply | Qty: 30 | Fill #1

## 2016-06-09 MED FILL — ?AMLODIPINE BESYLATE 10 MG: 10 | 30 days supply | Qty: 30 | Fill #1

## 2016-06-09 MED FILL — FOLIC ACID 1 MG TABLET: 1 | 30 days supply | Qty: 30 | Fill #0

## 2016-06-09 MED FILL — ?ATORVASTATIN 40MG TABLET: 40 | 30 days supply | Qty: 30 | Fill #3

## 2016-07-09 ENCOUNTER — Telehealth: Payer: Self-pay | Admitting: *Deleted

## 2016-07-09 ENCOUNTER — Encounter (HOSPITAL_COMMUNITY): Payer: Self-pay

## 2016-07-09 ENCOUNTER — Emergency Department (HOSPITAL_COMMUNITY): Payer: Medicaid Other

## 2016-07-09 ENCOUNTER — Emergency Department (HOSPITAL_COMMUNITY)
Admission: EM | Admit: 2016-07-09 | Discharge: 2016-07-09 | Disposition: A | Discharge status: Home / Self Care | Payer: Medicaid Other | Attending: Emergency Medicine | Admitting: Emergency Medicine

## 2016-07-09 DIAGNOSIS — Z79899 Other long term (current) drug therapy: Secondary | ICD-10-CM | POA: Insufficient documentation

## 2016-07-09 DIAGNOSIS — I10 Essential (primary) hypertension: Secondary | ICD-10-CM | POA: Insufficient documentation

## 2016-07-09 DIAGNOSIS — Z7982 Long term (current) use of aspirin: Secondary | ICD-10-CM | POA: Diagnosis not present

## 2016-07-09 DIAGNOSIS — I2510 Atherosclerotic heart disease of native coronary artery without angina pectoris: Secondary | ICD-10-CM | POA: Diagnosis not present

## 2016-07-09 DIAGNOSIS — R079 Chest pain, unspecified: Secondary | ICD-10-CM | POA: Diagnosis present

## 2016-07-09 DIAGNOSIS — R072 Precordial pain: Secondary | ICD-10-CM | POA: Diagnosis not present

## 2016-07-09 LAB — CBC
HCT: 38.1 % (ref 36.0–46.0)
Hemoglobin: 11.8 g/dL — ABNORMAL LOW (ref 12.0–15.0)
MCH: 22.3 pg — ABNORMAL LOW (ref 26.0–34.0)
MCHC: 31 g/dL (ref 30.0–36.0)
MCV: 72.2 fL — ABNORMAL LOW (ref 78.0–100.0)
Platelets: 178 10*3/uL (ref 150–400)
RBC: 5.28 MIL/uL — ABNORMAL HIGH (ref 3.87–5.11)
RDW: 15.3 % (ref 11.5–15.5)
WBC: 5.5 10*3/uL (ref 4.0–10.5)

## 2016-07-09 LAB — BASIC METABOLIC PANEL
Anion gap: 5 (ref 5–15)
BUN: 17 mg/dL (ref 6–20)
CO2: 26 mmol/L (ref 22–32)
Calcium: 8.6 mg/dL — ABNORMAL LOW (ref 8.9–10.3)
Chloride: 106 mmol/L (ref 101–111)
Creatinine, Ser: 1.25 mg/dL — ABNORMAL HIGH (ref 0.44–1.00)
GFR calc Af Amer: 53 mL/min — ABNORMAL LOW (ref >60)
GFR calc non Af Amer: 45 mL/min — ABNORMAL LOW (ref >60)
Glucose, Bld: 105 mg/dL — ABNORMAL HIGH (ref 65–99)
Potassium: 3.8 mmol/L (ref 3.5–5.1)
Sodium: 137 mmol/L (ref 135–145)

## 2016-07-09 LAB — I-STAT TROPONIN, ED
Troponin i, poc: 0 ng/mL (ref 0.00–0.08)
Troponin i, poc: 0 ng/mL (ref 0.00–0.08)

## 2016-07-09 MED ORDER — CYCLOBENZAPRINE HCL 5 MG PO TABS: 5.0000 mg | ORAL_TABLET | Freq: Two times a day (BID) | ORAL | 0 refills | Status: DC | PRN

## 2016-07-09 MED ORDER — SODIUM CHLORIDE 0.9 % IV BOLUS (SEPSIS)
1000.0000 mL | Freq: Once | INTRAVENOUS | Status: AC
  Administered 2016-07-09: 1000 mL via INTRAVENOUS

## 2016-07-09 MED ORDER — IOPAMIDOL (ISOVUE-370) INJECTION 76%
INTRAVENOUS | Status: AC
  Administered 2016-07-09: 100 mL via INTRAVENOUS
  Filled 2016-07-09: qty 100

## 2016-07-09 MED ORDER — FENTANYL CITRATE (PF) 100 MCG/2ML IJ SOLN
50.0000 ug | Freq: Once | INTRAMUSCULAR | Status: AC
  Administered 2016-07-09: 50 ug via INTRAVENOUS
  Filled 2016-07-09: qty 2

## 2016-07-09 NOTE — ED Notes (Signed)
EMT student attempted x1 for IV access without success

## 2016-07-09 NOTE — Discharge Instructions (Signed)
Your workup today did not show evidence of problems with your heart, lungs, or aorta like they have in the past. Since your pain has improved any of had no further pain here, we feel comfortable letting you be discharged. Please schedule a follow-up appointment with your primary care physician as well as her cardiologist. Please try the medication to help with your discomfort if returns. Please try to rest and stay hydrated. If any symptoms change or worsen, please return to the nearest emergency department.

## 2016-07-09 NOTE — ED Triage Notes (Signed)
Per pt, Pt is coming from home with complaints of right chest pain that radiates to her right shoulder starting three days ago. Hx of MI. Denies any SOB, N/V/D

## 2016-07-09 NOTE — Telephone Encounter (Signed)
lvm will call pt Monday to schedule appointment next week for post hospital.

## 2016-07-09 NOTE — ED Provider Notes (Signed)
[facility] DEPT Provider Note   CSN: [id number] Arrival date & time: 07/09/16  0703     History   Chief Complaint Chief Complaint  Patient presents with  . Chest Pain    HPI Alexis Wheeler is a 61 y.o. female.  The history is provided by the patient and medical records.  Chest Pain   This is a new problem. The current episode started more than 2 days ago. The problem occurs daily. The problem has been gradually improving. The pain is associated with movement. The pain is present in the lateral region. The pain is at a severity of 5/10. The pain is moderate. The quality of the pain is described as sharp and pressure-like. The pain radiates to the upper back and right shoulder. Duration of episode(s) is 3 days. Pertinent negatives include no abdominal pain, no back pain, no cough, no diaphoresis, no dizziness, no exertional chest pressure, no fever, no headaches, no nausea, no near-syncope, no palpitations, no shortness of breath, no sputum production, no syncope and no vomiting. She has tried nothing for the symptoms. The treatment provided no relief.  Her past medical history is significant for aortic dissection, CAD and PE.  Pertinent negatives for past medical history include no pacemaker.    Past Medical History:  Diagnosis Date  . Arthritis    right knee  . CAD (coronary artery disease)    a. 04/2015: cath showed 30% Ost RCA stenosis, 20% mid-LAD stenosis, and normal LV function.   . Congestive dilated cardiomyopathy ([place]) 08/01/2014  . GERD   . HEMATURIA UNSPECIFIED   . History of kidney stones   . HYPERLIPIDEMIA   . HYPERTENSION   . Morbid obesity ([place])   . MYOSITIS   . Sleep apnea    cpap-  doesnt use it- last study years ago  . Vertigo     Patient Active Problem List   Diagnosis Date Noted  . Coronary artery disease 03/08/2016  . Diastolic dysfunction without heart failure 03/08/2016  . Chest pain 03/07/2016  . Unspecified essential hypertension  03/07/2016  . Fatigue 04/30/2015  . Hypokalemia 12/20/2014  . 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 Dilated cardiomyopathy ([place]) 08/01/2014  . Plantar fasciitis of left foot 05/02/2014  . Non-compliant behavior 03/27/2014  . S/P aortic dissection repair 03/03/2014  . Lumbosacral spondylosis without myelopathy 01/26/2014  . Greater trochanteric bursitis of right hip 10/10/2013  . Chronic low back pain 09/26/2013  . Chronic right hip pain 09/26/2013  . Allergic rhinitis 08/29/2012  . Kidney stone 12/31/2010  . Morbid obesity ([place]) 12/18/2009  . GERD 12/18/2009  . HEMATURIA UNSPECIFIED 12/17/2009  . Hyperlipidemia with target LDL less than 70 12/16/2009  . Hypertensive heart disease 12/16/2009    Past Surgical History:  Procedure Laterality Date  . ABDOMINAL HYSTERECTOMY  1983  . CARDIAC CATHETERIZATION N/A 04/30/2015   Procedure: Left Heart Cath and Coronary Angiography;  Surgeon: Peter M Martinique, MD;  Location: [facility];  Service: Cardiovascular;  Laterality: N/A;  . CHOLECYSTECTOMY  2001  . CYSTOSCOPY W/ RETROGRADES Left 08/02/2015   Procedure: CYSTOSCOPY WITH LEFT RETROGRADE PYELOGRAM;  Surgeon: Festus Aloe, MD;  Location: [facility];  Service: Urology;  Laterality: Left;  . CYSTOSCOPY WITH URETEROSCOPY, STONE BASKETRY AND STENT PLACEMENT Left 08/02/2015   Procedure:  Mitzi Davenport ;  Surgeon: Festus Aloe, MD;  Location: [facility];  Service: Urology;  Laterality: Left;  . CYSTOSCOPY/URETEROSCOPY/HOLMIUM LASER/STENT PLACEMENT Left 08/02/2015   Procedure: CYSTOSCOPY/URETEROSCOPY/HOLMIUM LASER/STENT PLACEMENT-LEFT;  Surgeon: Festus Aloe, MD;  Location: [facility];  Service: Urology;  Laterality: Left;  . HOLMIUM LASER APPLICATION Left 9/67/5916   Procedure: HOLMIUM LASER APPLICATION;  Surgeon: Festus Aloe, MD;  Location: [facility];  Service: Urology;  Laterality: Left;  . REPLACEMENT ASCENDING AORTA N/A 03/03/2014   Procedure: REPAIR OF ASCENDING AORTIC  DISSECTION;  Surgeon: Grace Isaac, MD;  Location: [facility];  Service: Open Heart Surgery;  Laterality: N/A;    OB History    No data available       Home Medications    Prior to Admission medications   Medication Sig Start Date End Date Taking? Authorizing Provider  amLODipine (NORVASC) 10 MG tablet TAKE 1 TABLET BY MOUTH DAILY 04/29/16   Burtis Junes, NP  aspirin EC 81 MG EC tablet Take 1 tablet (81 mg total) by mouth daily. 03/14/14   Collins, Arlyn Leak, PA-C  atorvastatin (LIPITOR) 40 MG tablet Take 1 tablet (40 mg total) by mouth daily at 6 PM. 12/24/15   Burtis Junes, NP  carvedilol (COREG) 12.5 MG tablet Take 1 tablet (12.5 mg total) by mouth 2 (two) times daily. 12/24/15 03/23/16  Burtis Junes, NP  carvedilol (COREG) 12.5 MG tablet Take 1 tablet (12.5 mg total) by mouth 2 (two) times daily. 03/25/16   Burtis Junes, NP  Cholecalciferol 1000 units tablet Take 1,000 Units by mouth daily.    [provider]  folic acid (FOLVITE) 1 MG tablet TAKE 1 TABLET BY MOUTH DAILY. 06/09/16   Burtis Junes, NP  lisinopril (PRINIVIL,ZESTRIL) 40 MG tablet TAKE 1 TABLET BY MOUTH DAILY. 04/29/16   Burtis Junes, NP  Omeprazole 20 MG TBEC Take 1 tablet (20 mg total) by mouth daily. 04/15/16   Burtis Junes, NP  spironolactone (ALDACTONE) 50 MG tablet Take 1 tablet (50 mg total) by mouth daily. 03/25/16   Burtis Junes, NP  vitamin E 400 UNIT capsule Take 400 Units by mouth daily.    [provider]    Family History Family History  Problem Relation Age of Onset  . Breast cancer Mother 69  . Aortic dissection Mother 18  . Hypertension Mother   . Throat cancer Brother        1 bro living  . Hypertension Father   . Breast cancer Sister   . Hypertension Sister   . Hypertension Daughter   . Seizures Neg Hx     Social History Social History  Substance Use Topics  . Smoking status: Never Smoker  . 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 Alcohol use No     Allergies   Hydralazine and Motrin [ibuprofen]   Review of Systems Review of Systems  Constitutional: Negative for chills, diaphoresis, fatigue and fever.  HENT: Negative for congestion.   Eyes: Negative for visual disturbance.  Respiratory: Negative for cough, sputum production, chest tightness, shortness of breath, wheezing and stridor.   Cardiovascular: Positive for chest pain. Negative for palpitations, syncope and near-syncope.  Gastrointestinal: Negative for abdominal pain, diarrhea, nausea and vomiting.  Genitourinary: Negative for dysuria and flank pain.  Musculoskeletal: Negative for back pain, neck pain and neck stiffness.  Skin: Negative for rash and wound.  Neurological: Negative for dizziness, light-headedness and headaches.  Psychiatric/Behavioral: Negative for agitation.  All other systems reviewed and are negative.    Physical  Exam Updated Vital Signs BP 132/69   Pulse 60   Temp 97.9 F (36.6 C) (Oral)   Resp 14   Ht 5\' 3"  (1.6 m)   Wt 91.2 kg (201 lb)   SpO2 99%   BMI 35.61 kg/m   Physical Exam  Constitutional: She is oriented to person, place, and time. She appears well-developed and well-nourished. No distress.  HENT:  Head: Normocephalic and atraumatic.  Right Ear: External ear normal.  Left Ear: External ear normal.  Nose: Nose normal.  Mouth/Throat: Oropharynx is clear and moist. No oropharyngeal exudate.  Eyes: Conjunctivae and EOM are normal. Pupils are equal, round, and reactive to light.  Neck: Normal range of motion. Neck supple.  Cardiovascular: Normal rate and intact distal pulses.   Murmur heard. Pulmonary/Chest: Effort normal and breath sounds normal. No stridor. No respiratory distress. She has no wheezes. She has no rales. She exhibits no tenderness.  Abdominal: She exhibits no distension. There is no tenderness. There is no rebound.   Musculoskeletal: She exhibits no edema, tenderness or deformity.  Neurological: She is alert and oriented to person, place, and time. She has normal reflexes. No cranial nerve deficit or sensory deficit. She exhibits normal muscle tone.  Skin: Skin is warm. Capillary refill takes less than 2 seconds. No rash noted. She is not diaphoretic. No erythema.  Psychiatric: She has a normal mood and affect.  Nursing note and vitals reviewed.    ED Treatments / Results  Labs (all labs ordered are listed, but only abnormal results are displayed) Labs Reviewed  BASIC METABOLIC PANEL - Abnormal; Notable for the following:       Result Value   Glucose, Bld 105 (*)    Creatinine, Ser 1.25 (*)    Calcium 8.6 (*)    GFR calc non Af Amer 45 (*)    GFR calc Af Amer 53 (*)    All other components within normal limits  CBC - Abnormal; Notable for the following:    RBC 5.28 (*)    Hemoglobin 11.8 (*)    MCV 72.2 (*)    MCH 22.3 (*)    All other components within normal limits  I-STAT TROPOININ, ED  I-STAT TROPOININ, ED    EKG  EKG Interpretation  Date/Time:  Thursday July 09 2016 07:10:06 EDT Ventricular Rate:  63 PR Interval:  196 QRS Duration: 82 QT Interval:  426 QTC Calculation: 435 R Axis:   42 Text Interpretation:  Normal sinus rhythm Normal ECG When compared to prior, no significant changes.  no STEMI Confirmed by Antony Blackbird [phone]) on 07/09/2016 10:37:40 AM       Radiology Dg Chest 2 View  Result Date: 07/09/2016 CLINICAL DATA:  Chest pain. EXAM: CHEST  2 VIEW COMPARISON:  CT 03/08/2016.  Chest x-ray 03/07/2016 . FINDINGS: Prior median sternotomy. Stable cardiomegaly. Stable tortuosity thoracic aorta. No focal infiltrate. Mild left base subsegmental atelectasis. No pleural effusion or pneumothorax. IMPRESSION: Prior median sternotomy. Stable cardiomegaly in stable tortuosity of the thoracic aorta. No acute cardiopulmonary disease. Electronically Signed   By: Marcello Moores  Register    On: 07/09/2016 07:53   Ct Angio Chest/abd/pel For Dissection W And/or Wo Contrast  Result Date: 07/09/2016 CLINICAL DATA:  Chest pain, right back and shoulder pain. History of aortic dissection. EXAM: CT ANGIOGRAPHY CHEST, ABDOMEN AND PELVIS TECHNIQUE: Multidetector CT imaging through the chest, abdomen and pelvis was performed using the standard protocol during bolus administration of intravenous contrast. Multiplanar reconstructed images and MIPs were  obtained and reviewed to evaluate the vascular anatomy. CONTRAST:  100 cc Isovue 370 IV COMPARISON:  03/08/2016 FINDINGS: CTA CHEST FINDINGS Cardiovascular: There is mild cardiomegaly. Stable aortic dissection with prior repair of the ascending thoracic aorta. The dissection extends from the distal aspect of the tube graft through the aortic arch, its extending into the brachiocephalic artery and through the descending thoracic aorta. This has a stable appearance when compared to prior study. No evidence of aortic arch remains mildly dilated at 3.9 cm, stable. Proximal descending thoracic aorta stable at 3.5 cm. Mediastinum/Nodes: No mediastinal, hilar, or axillary adenopathy. Small hiatal hernia. Lungs/Pleura: Lungs are clear. No focal airspace opacities or suspicious nodules. No effusions. Musculoskeletal: No acute bony abnormality. Review of the MIP images confirms the above findings. CTA ABDOMEN AND PELVIS FINDINGS VASCULAR Aorta: Dissection continues into the distal abdominal aorta just above the bifurcation, stable. No evidence of aortic aneurysm. Celiac: Patent, arising from the true lumen. SMA: Patent, arising from the true lumen. Renals: Right renal artery is patent, arising from the true lumen. There is a small accessory left renal artery supplying the lower pole of the left kidney which arises from the true lumen and as needed. I suspect the main left pulmonary artery arises from the false lumen in appears occluded or nearly occluded, with decreased  perfusion noted in the mid and upper poles of the left kidney. IMA: Patent, from the true lumen. Iliac artery is are widely patent without dissection or aneurysm. Inflow: Veins: Grossly patent. Review of the MIP images confirms the above findings. NON-VASCULAR Hepatobiliary: No focal hepatic abnormality. Gallbladder unremarkable. Pancreas: No focal abnormality or ductal dilatation. Spleen: No focal abnormality.  Normal size. Adrenals/Urinary Tract: Left adrenal nodule again noted, measuring 2 cm, stable, likely adenoma. Right adrenal gland and right kidney unremarkable. Again noted is decreased perfusion to the upper and mid poles of the left kidney. No hydronephrosis. Urinary bladder unremarkable. Stomach/Bowel: Stomach, large and small bowel grossly unremarkable. Lymphatic: No adenopathy. Reproductive: Prior hysterectomy.  No adnexal masses. Other: Trace free fluid in the pelvis. Musculoskeletal: No acute bony abnormality. Review of the MIP images confirms the above findings. IMPRESSION: Stable aortic dissection involving the aortic arch, descending thoracic aorta and abdominal aorta. The dissection begins just beyond the tube graft repair site in the ascending thoracic aorta and involves the brachiocephalic artery which remains patent. Dissection is unchanged. Mesenteric vessels are patent, arising from the true lumen. 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 Decreased profusion to the mid and upper pole of the left kidney. This is a stable finding when compared to study from 03/08/2016. No significant change since prior study. No acute findings in the chest, abdomen or pelvis. Electronically Signed   By: Rolm Baptise M.D.   On: 07/09/2016 11:22    Procedures Procedures (including critical care time)  Medications Ordered in ED Medications  fentaNYL (SUBLIMAZE) injection 50 mcg (50 mcg Intravenous Given 07/09/16 1015)    iopamidol (ISOVUE-370) 76 % injection (100 mLs Intravenous Contrast Given 07/09/16 1036)  sodium chloride 0.9 % bolus 1,000 mL (0 mLs Intravenous Stopped 07/09/16 1309)     Initial Impression / Assessment and Plan / ED Course  I have reviewed the triage vital signs and the nursing notes.  Pertinent labs & imaging results that were available during my care of the patient were reviewed by me and considered in my medical decision making (see chart  for details).       ROCHANDA HARPHAM is a 61 y.o. female with a past medical history significant for aortic dissection status post aortic valve repair, CAD, hyperlipidemia, hypertension, pulmonary embolism, and GERD who presents with chest pain. Patient reports that for the last 3 days, she has had chest pain in her right central chest. She reports that radiates to her right shoulder blade and back. She describes it as a 5 out of 10 severity and constant. It is nonpleuritic and nonexertional. She denies any tenderness to palpation in her chest or shoulder. Patient denies shortness of breath, nausea, vomiting, diaphoresis. She does report lightheadedness but no syncopal episodes. She tried to take some Pepto-Bismol because she thought her chest pain was secondary to [employer] intake several days ago however did not help. She denies any fevers, chills, cough, urinary symptoms or bowel symptoms.  History and exam are seen above. Patient's chest is nontender. Back is nontender. Lungs are clear on exam. Patient has no lower extremity edema. Legs are nontender. No focal neurologic deficits.  Based on patient's history of CAD, PE, aortic dissection, patient will need imaging to rule out worrisome pathology as cause of her chest pain radiating to her back. Patient will have CTA of the aorta to look for injuries. Patient EKG showed no evidence of acute ischemia. No changes from prior. No STEMI. Initial troponin was negative, this will be  repeated.  Anticipate reassessment following imaging studies. If no evidence of dissection or pulmonary embolism is seen on CT scan, patient will likely be stable for discharge for atypical chest pain.       Patient's diagnostic testing results are seen above. BNP showed similar creatinine to prior 1.25. Electrolytes were grossly unremarkable. CBC was also similar to prior. Troponin nonelevated.  CTA showed no evidence of change in dissection as well as no evidence of pneumonia, pulmonary embolism, or other intrathoracic problem.  Given resolution of patient's symptoms and reassuring workup, feel patient is stable for discharge. Shared decision-making conversation held to discuss disposition. As patient reports a history of MI, admission for further chest pain workup was discussed however, patient feels comfortable going home for outpatient workup with her cardiologist and PCP. Patient was given prescription for muscle relaxant as her symptoms seem to be muscular skeletal and reassessment in the right chest and right shoulder. Patient had no further pain in the ED.  Patient understood follow-up instructions and return precautions. Patient had no other questions or concerns and patient was discharged in good condition.    Final Clinical Impressions(s) / ED Diagnoses   Final diagnoses:  Precordial chest pain    New Prescriptions Discharge Medication List as of 07/09/2016  1:04 PM    START taking these medications   Details  cyclobenzaprine (FLEXERIL) 5 MG tablet Take 1 tablet (5 mg total) by mouth 2 (two) times daily as needed for muscle spasms., Starting Thu 07/09/2016, Print        Clinical Impression: 1. Precordial chest pain     Disposition: Discharge  Condition: Good  I have discussed the results, Dx and Tx plan with the pt(& family if present). He/she/they expressed understanding and agree(s) with the plan. Discharge instructions discussed at great length. Strict return  precautions discussed and pt &/or family have verbalized understanding of the instructions. No further questions at time of discharge.    Discharge Medication List as of 07/09/2016  1:04 PM    START taking these medications   Details  cyclobenzaprine (FLEXERIL) 5 MG tablet Take 1 tablet (5 mg total) by mouth 2 (two) times daily as needed for muscle spasms., Starting Thu 07/09/2016, Print        Follow Up: Burtis Junes, NP [facility] [place] [address] [phone]  Schedule an appointment as soon as possible for a visit    [facility] [address] [id number] [place] [phone]  If symptoms worsen     Tegeler, Gwenyth Allegra, MD 07/10/16 [phone]

## 2016-07-09 NOTE — Telephone Encounter (Signed)
Pt will come in next week for appointment with Truitt Merle.NP.

## 2016-07-13 ENCOUNTER — Telehealth: Payer: Self-pay | Admitting: *Deleted

## 2016-07-13 NOTE — Telephone Encounter (Signed)
S/w pt will come in on July 3 for post hospital appt with Cecille Rubin.

## 2016-07-14 ENCOUNTER — Ambulatory Visit (INDEPENDENT_AMBULATORY_CARE_PROVIDER_SITE_OTHER): Payer: Medicaid Other | Admitting: Nurse Practitioner

## 2016-07-14 ENCOUNTER — Encounter: Payer: Self-pay | Admitting: Nurse Practitioner

## 2016-07-14 VITALS — BP 110/80 | HR 69 | Ht 63.0 in | Wt 201.1 lb

## 2016-07-14 DIAGNOSIS — I420 Dilated cardiomyopathy: Secondary | ICD-10-CM

## 2016-07-14 DIAGNOSIS — I7101 Dissection of thoracic aorta: Secondary | ICD-10-CM | POA: Diagnosis not present

## 2016-07-14 DIAGNOSIS — R0789 Other chest pain: Secondary | ICD-10-CM

## 2016-07-14 DIAGNOSIS — I10 Essential (primary) hypertension: Secondary | ICD-10-CM | POA: Diagnosis not present

## 2016-07-14 DIAGNOSIS — I71019 Dissection of thoracic aorta, unspecified: Secondary | ICD-10-CM

## 2016-07-14 MED ORDER — LISINOPRIL 40 MG PO TABS: 40.0000 mg | ORAL_TABLET | Freq: Every day | ORAL | 6 refills | Status: DC

## 2016-07-14 MED ORDER — ATORVASTATIN CALCIUM 40 MG PO TABS: 40.0000 mg | ORAL_TABLET | Freq: Every day | ORAL | 6 refills | Status: DC

## 2016-07-14 MED ORDER — CARVEDILOL 12.5 MG PO TABS: 12.5000 mg | ORAL_TABLET | Freq: Two times a day (BID) | ORAL | 6 refills | Status: DC

## 2016-07-14 MED ORDER — AMLODIPINE BESYLATE 10 MG PO TABS: 10.0000 mg | ORAL_TABLET | Freq: Every day | ORAL | 6 refills | Status: DC

## 2016-07-14 MED ORDER — SPIRONOLACTONE 50 MG PO TABS: 50.0000 mg | ORAL_TABLET | Freq: Every day | ORAL | 6 refills | Status: DC

## 2016-07-14 MED ORDER — OMEPRAZOLE 20 MG PO TBEC: 20.0000 mg | DELAYED_RELEASE_TABLET | Freq: Every day | ORAL | 9 refills | Status: DC

## 2016-07-14 MED FILL — ?ATORVASTATIN 40MG TABLET: 40 | 30 days supply | Qty: 30 | Fill #0

## 2016-07-14 MED FILL — ?AMLODIPINE BESYLATE 10 MG: 10 | 30 days supply | Qty: 30 | Fill #0

## 2016-07-14 MED FILL — LISINOPRIL 40 MG TABLET: 40 | 30 days supply | Qty: 30 | Fill #0

## 2016-07-14 NOTE — Patient Instructions (Addendum)
We will be checking the following labs today - NONE   Medication Instructions:    Continue with your current medicines.   I sent in your refills to [facility] and to [facility]    Testing/Procedures To Be Arranged:  N/A  Follow-Up:   See me back as planned in August    Other Special Instructions:   N/A    If you need a refill on your cardiac medications before your next appointment, please call your pharmacy.   Call the [place] office at [phone] if you have any questions, problems or concerns.

## 2016-07-14 NOTE — Progress Notes (Signed)
CARDIOLOGY OFFICE NOTE  Date:  07/14/2016    Alexis Wheeler Date of Birth: 06/27/1955 Medical Record #[id number]  PCP:  Burtis Junes, NP  Cardiologist:  Rosanne Sack    Chief Complaint  Patient presents with  . Chest Pain    Post ER visit - seen for Dr. Radford Pax    History of Present Illness: Alexis Wheeler is a 61 y.o. female who presents today for a work in visit. Seen for Dr. Radford Pax.   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 She underwent replacement of the ascending aorta with resuspension of the AV with 30 mm Hemashield Platinum graft with R axillary artery cannulation by Dr. Servando Snare. EF was 40-45% post op. Follow up echo from 07/2014 showed improvement of her EF - now at 55 to 60%.   Admitted back in December of 2016 with uncontrolled HTN. Medicines were all changed. She had stopped some. She was referred back here for management of BP. Did not have PCP. She was placed on spironolactone and changed her atenolol to carvedilol.   I then sawher back several times - BP still too high. Did not tolerate Hydralazine. Complained of what sounded like exertional fatigue - she was not cathed prior to her AAA dissection and I was worried about CAD. Myoview was obtained - turned out to be high risk - was referred for cath - this was reassuring - only with very mild CAD that is nonobstructive. When seen back in May of 2017 was having what sounded like pain from kidney stones. Ended up seeing GU.   I  saw her back in December of 2017 - money was a big issue. Medicines a little mixed up - on 2 beta blockers and not clear why - got that straightened out and increased the dose of the Coreg for better BP control. She was otherwise felt to be doing ok. On follow up in January she was doing ok. Trying to get a new PCP but having difficulty with this.    Back in February of 2018 she got a CT angio when admitted with chest pain -- possible small PE versus scarring noted - she did not wish to be on blood thinner. No DVT on doppler study. She was not short of breath and clinically did not fit the picture for a PE. She has not been short of breath.   I last saw her in April - she was doing ok. BP stable. Still with these "weak spells" that the etiology is unknown. Having lots of trouble trying to get a PCP. To go on Medicare this August.   In the ER last week with chest pain - negative evaluation. CTA of the chest noted. No PE on this study noted.   Comes back today. Here alone.  Tells me that her chest pain started out as heartburn but then moved to her right chest and then to her right shoulder. Tried to tough it out. Woke up the next day and her balance was "off". She has had prior cholecystectomy. Her visit there to the ER was unremarkable. She has been taking her medicines. BP not too bad. Feels fine now. No recurrence of her pain. She does have PCP now and is on Medicare. [facility] She was previously  walking a mile a day until it got so hot.   Past Medical History:  Diagnosis Date  . Arthritis    right knee  . CAD (coronary artery disease)    a. 04/2015: cath showed 30% Ost RCA stenosis, 20% mid-LAD stenosis, and normal LV function.   . Congestive dilated cardiomyopathy ([place]) 08/01/2014  . GERD   . HEMATURIA UNSPECIFIED   . History of kidney stones   . HYPERLIPIDEMIA   . HYPERTENSION   . Morbid obesity ([place])   . MYOSITIS   . Sleep apnea    cpap-  doesnt use it- last study years ago  . Vertigo     Past Surgical History:  Procedure Laterality Date  . ABDOMINAL HYSTERECTOMY  1983  . CARDIAC CATHETERIZATION N/A 04/30/2015   Procedure: Left Heart Cath and Coronary Angiography;  Surgeon: Peter M Martinique, MD;  Location: [facility];  Service: Cardiovascular;  Laterality: N/A;  . CHOLECYSTECTOMY   2001  . CYSTOSCOPY W/ RETROGRADES Left 08/02/2015   Procedure: CYSTOSCOPY WITH LEFT RETROGRADE PYELOGRAM;  Surgeon: Festus Aloe, MD;  Location: [facility];  Service: Urology;  Laterality: Left;  . CYSTOSCOPY WITH URETEROSCOPY, STONE BASKETRY AND STENT PLACEMENT Left 08/02/2015   Procedure:  Mitzi Davenport ;  Surgeon: Festus Aloe, MD;  Location: [facility];  Service: Urology;  Laterality: Left;  . CYSTOSCOPY/URETEROSCOPY/HOLMIUM LASER/STENT PLACEMENT Left 08/02/2015   Procedure: CYSTOSCOPY/URETEROSCOPY/HOLMIUM LASER/STENT PLACEMENT-LEFT;  Surgeon: Festus Aloe, MD;  Location: [facility];  Service: Urology;  Laterality: Left;  . HOLMIUM LASER APPLICATION Left 7/85/8850   Procedure: HOLMIUM LASER APPLICATION;  Surgeon: Festus Aloe, MD;  Location: [facility];  Service: Urology;  Laterality: Left;  . REPLACEMENT ASCENDING AORTA N/A 03/03/2014   Procedure: REPAIR OF ASCENDING AORTIC DISSECTION;  Surgeon: Grace Isaac, MD;  Location: [facility];  Service: Open Heart Surgery;  Laterality: N/A;     Medications: Current Meds  Medication Sig  . amLODipine (NORVASC) 10 MG tablet Take 1 tablet (10 mg total) by mouth daily.  [name] aspirin EC 81 MG EC tablet Take 1 tablet (81 mg total) by mouth daily.  [name] atorvastatin (LIPITOR) 40 MG tablet Take 1 tablet (40 mg total) by mouth daily at 6 PM.  . carvedilol (COREG) 12.5 MG tablet Take 1 tablet (12.5 mg total) by mouth 2 (two) times daily.  . Cholecalciferol 1000 units tablet Take 1,000 Units by mouth daily.  . folic acid (FOLVITE) 1 MG tablet Take 1 mg by mouth daily.  [name] lisinopril (PRINIVIL,ZESTRIL) 40 MG tablet Take 1 tablet (40 mg total) by mouth daily.  . Omeprazole 20 MG TBEC Take 1 tablet (20 mg total) by mouth daily.  [name] spironolactone (ALDACTONE) 50 MG tablet Take 1 tablet (50 mg total) by mouth daily.  . vitamin E 400 UNIT capsule Take 400 Units by mouth daily.  . [DISCONTINUED] amLODipine (NORVASC) 10 MG tablet Take 10 mg by mouth daily.  .  [DISCONTINUED] atorvastatin (LIPITOR) 40 MG tablet Take 1 tablet (40 mg total) by mouth daily at 6 PM.  . [DISCONTINUED] carvedilol (COREG) 12.5 MG tablet Take 1 tablet (12.5 mg total) by mouth 2 (two) times daily.  . [DISCONTINUED] lisinopril (PRINIVIL,ZESTRIL) 40 MG tablet Take 40 mg by mouth daily.  . [DISCONTINUED] Omeprazole 20 MG TBEC Take 1 tablet (20 mg total) by mouth daily.  . [DISCONTINUED] spironolactone (ALDACTONE) 50 MG tablet Take 1 tablet (50 mg total) by mouth daily.     Allergies: Allergies  Allergen Reactions  .  Hydralazine Nausea Only  . Motrin [Ibuprofen] Nausea Only    Upset GI (motrin brand causes the side effect)    Social History: The patient  reports that she has never smoked. She has never used smokeless tobacco. She reports that she does not drink alcohol or use drugs.   Family History: The patient's family history includes Aortic dissection (age of onset: 20) in her mother; Breast cancer in her sister; Breast cancer (age of onset: 101) in her mother; Hypertension in her daughter, father, mother, and sister; Throat cancer in her brother.   Review of Systems: Please see the history of present illness.   Otherwise, the review of systems is positive for none.   All other systems are reviewed and negative.   Physical Exam: VS:  BP 110/80 (BP Location: Left Arm, Patient Position: Sitting, Cuff Size: Large)   Pulse 69   Ht 5\' 3"  (1.6 m)   Wt 201 lb 1.9 oz (91.2 kg)   SpO2 97% Comment: at rest  BMI 35.63 kg/m  .  BMI Body mass index is 35.63 kg/m.  Wt Readings from Last 3 Encounters:  07/14/16 201 lb 1.9 oz (91.2 kg)  07/09/16 201 lb (91.2 kg)  05/05/16 199 lb (90.3 kg)   BP is 140/80 by me.   General: Pleasant. She remains obese but alert and in no acute distress.   HEENT: Normal.  Neck: Supple, no JVD, carotid bruits, or masses noted.  Cardiac: Regular rate and rhythm. No murmurs, rubs, or gallops. No edema.  Respiratory:  Lungs are clear to  auscultation bilaterally with normal work of breathing.  GI: Soft and nontender.  MS: No deformity or atrophy. Gait and ROM intact.  Skin: Warm and dry. Color is normal.  Neuro:  Strength and sensation are intact and no gross focal deficits noted.  Psych: Alert, appropriate and with normal affect.   LABORATORY DATA:  EKG:  EKG is not ordered today.  Lab Results  Component Value Date   WBC 5.5 07/09/2016   HGB 11.8 (L) 07/09/2016   HCT 38.1 07/09/2016   PLT 178 07/09/2016   GLUCOSE 105 (H) 07/09/2016   CHOL 155 12/24/2015   TRIG 133 12/24/2015   HDL 53 12/24/2015   LDLCALC 75 12/24/2015   ALT 12 12/24/2015   AST 12 12/24/2015   NA 137 07/09/2016   K 3.8 07/09/2016   CL 106 07/09/2016   CREATININE 1.25 (H) 07/09/2016   BUN 17 07/09/2016   CO2 26 07/09/2016   TSH 0.78 03/08/2015   INR 0.94 04/26/2015   HGBA1C 5.8 (H) 03/07/2014     BNP (last 3 results)  Recent Labs  03/08/16 0405  BNP 16.9    ProBNP (last 3 results) No results for input(s): PROBNP in the last 8760 hours.   Other Studies Reviewed Today:  CTA CHEST IMPRESSION 06/2016: Stable aortic dissection involving the aortic arch, descending thoracic aorta and abdominal aorta. The dissection begins just beyond the tube graft repair site in the ascending thoracic aorta and involves the brachiocephalic artery which remains patent. Dissection is unchanged.  Mesenteric vessels are patent, arising from the true lumen. 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 Decreased profusion to the mid and upper pole of the left kidney. This is a stable finding when compared to study from 03/08/2016.  No significant change since prior study.  No acute findings in the chest, abdomen or pelvis.  Electronically Signed   By: Rolm Baptise M.D.   On: 07/09/2016 11:22   Cardiac Cath Procedures 04/2015   Left Heart Cath and  Coronary Angiography    Conclusion    Ost RCA to Prox RCA lesion, 30% stenosed.  Mid LAD lesion, 20% stenosed.  The left ventricular systolic function is normal.  1. Mild nonobstructive CAD 2. Normal LV function.  Plan: medical management.       Myoview Study Highlights4/2017    The left ventricular ejection fraction is moderately decreased (30-44%).  Nuclear stress EF: 42%.  Defect 1: There is a large defect of moderate severity.  Findings consistent with ischemia.  No T wave inversion was noted during stress.  There was no ST segment deviation noted during stress.  Large size, moderate severity reversible (SDS 9) inferolateral perfusion defect suggestive of ischemia. LVEF 42% with inferolateral hypokinesis. This is a high risk study based on the extent of ischemia.    Echo Study Conclusions from 07/2014  - Left ventricle: The cavity size was normal. There was mild focal  basal hypertrophy of the septum. Systolic function was normal.  The estimated ejection fraction was in the range of 55% to 60%.  Wall motion was normal; there were no regional wall motion  abnormalities. There was an increased relative contribution of  atrial contraction to ventricular filling. Doppler parameters are  consistent with abnormal left ventricular relaxation (grade 1  diastolic dysfunction). - Aortic valve: Trileaflet; normal thickness, mildly calcified  leaflets. There was trivial regurgitation. - Aorta: Aortic root dimension: 42 mm (ED). - Aortic root: The aortic root was mildly dilated. - Mitral valve: There was trivial regurgitation.  Assessment/Plan:  1. Chest pain - has nonobstructive coronary disease. Does not sound like this current episode was cardiac related. This is resolved and has not recurred. Will follow for now.   2. HTN - BP ok on her current regimen. Concerning about the left renal artery finding on her recent CT scan. For now, no change  in her BP medicines.    3. HLD - on statin - labs from December noted.   4. Prior aortic dissection repair - she sees Dr. Servando Snare annually. Most recent scan noted - concern for the left renal artery - this is a new finding it appears. Will ask EBG to review as well.   5. Exertional Fatigue - had high risk Myoview - but with reassuring cardiac cath findings. I have suspected some of this is deconditioning.    6. Iron deficiency anemia - back on Folic acid. Most recent HGB is stable.  7. Prior dilated CM - last echo showed EF had recovered but was lower by recent Myoview - yet normal at time of cardiac cath - she looks good clinically and I have left her on her current regimen.  8. Poor social situation - having lots of difficulty getting PCP. Going on Medicare in August - maybe then she will have better luck.          Current medicines are reviewed with the patient today.  The patient does not have concerns regarding medicines other than what has been noted above.  The following changes have been made:  See above.  Labs/ tests ordered today include:   No orders of the defined types were placed in this encounter.    Disposition:   FU with me in August as planned.    Patient is agreeable to this plan and will call if any problems develop  in the interim.   SignedTruitt Merle, NP  07/14/2016 9:48 AM  [facility] [address] [place], [place]  [address] Phone: [phone] Fax: [phone]

## 2016-08-17 MED FILL — FOLIC ACID 1 MG TABLET: 1 | 30 days supply | Qty: 30 | Fill #1

## 2016-08-17 MED FILL — LISINOPRIL 40 MG TABLET: 40 | 30 days supply | Qty: 30 | Fill #1

## 2016-08-17 MED FILL — AMLODIPINE BESYLATE 10 MG T: 10 | 30 days supply | Qty: 30 | Fill #1

## 2016-08-17 MED FILL — ATORVASTATIN 40 MG TABLET: 40 | 30 days supply | Qty: 30 | Fill #1

## 2016-08-31 ENCOUNTER — Encounter: Payer: Self-pay | Admitting: Nurse Practitioner

## 2016-08-31 ENCOUNTER — Ambulatory Visit (INDEPENDENT_AMBULATORY_CARE_PROVIDER_SITE_OTHER): Payer: Medicare Other | Admitting: Nurse Practitioner

## 2016-08-31 VITALS — BP 118/64 | HR 56 | Ht 63.0 in | Wt 202.8 lb

## 2016-08-31 DIAGNOSIS — E7800 Pure hypercholesterolemia, unspecified: Secondary | ICD-10-CM

## 2016-08-31 DIAGNOSIS — R739 Hyperglycemia, unspecified: Secondary | ICD-10-CM

## 2016-08-31 DIAGNOSIS — I10 Essential (primary) hypertension: Secondary | ICD-10-CM | POA: Diagnosis not present

## 2016-08-31 DIAGNOSIS — R5383 Other fatigue: Secondary | ICD-10-CM | POA: Diagnosis not present

## 2016-08-31 DIAGNOSIS — I71019 Dissection of thoracic aorta, unspecified: Secondary | ICD-10-CM

## 2016-08-31 DIAGNOSIS — I7101 Dissection of thoracic aorta: Secondary | ICD-10-CM | POA: Diagnosis not present

## 2016-08-31 NOTE — Patient Instructions (Addendum)
We will be checking the following labs today - BMET, TSH, CBC, A1C, HPF and lipids   Medication Instructions:    Continue with your current medicines.     Testing/Procedures To Be Arranged:  N/A  Follow-Up:   See me in about 4 months    Other Special Instructions:   Call your PCP about getting the colonoscopy set up.   Will see how we can get you back on your CPAP    If you need a refill on your cardiac medications before your next appointment, please call your pharmacy.   Call the [place] office at [phone] if you have any questions, problems or concerns.

## 2016-08-31 NOTE — Progress Notes (Signed)
CARDIOLOGY OFFICE NOTE  Date:  08/31/2016    Alexis Wheeler Date of Birth: [birth year] Medical Record #[id number]  PCP:  Burtis Junes, NP  Cardiologist:  Rosanne Sack   Chief Complaint  Patient presents with  . Hypertension    Follow up visit - seen for Dr. Radford Pax    History of Present Illness: Alexis Wheeler is a 61 y.o. female who presents today for a follow up visit. Seen for Dr. Radford Pax.   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 She underwent replacement of the ascending aorta with resuspension of the AV with 30 mm Hemashield Platinum graft with R axillary artery cannulation by Dr. Servando Snare. EF was 40-45% post op. Follow up echo from 07/2014 showed improvement of her EF - now at 55 to 60%.   Admitted back in December of 2016 with uncontrolled HTN. Medicines were all changed. She had stopped some. She was referred back here for management of BP. Did not have PCP. She was placed on spironolactone and changed her atenolol to carvedilol.   I then sawher back several times - BP still too high. Did not tolerate Hydralazine. Complained of what sounded like exertional fatigue - she was not cathed prior to her AAA dissection and I was worried about CAD. Myoview was obtained - turned out to be high risk - was referred for cath - this was reassuring - only with very mild CAD that is nonobstructive. When seen back in May of 2017 was having what sounded like pain from kidney stones. Ended up seeing GU.   I  saw her back in December of 2017- money was a big issue. Medicines a little mixed up - on 2 beta blockers and not clear why - got that straightened out and increased the dose of the Coreg for better BP control. She was otherwise felt to be doing ok. On follow up in January she was doing ok. Trying to get a new PCP but having difficulty with this.     Back in February of 2018 she got a CT angio when admitted with chest pain -- possible small PE versus scarring noted - she did not wish to be on blood thinner. No DVT on doppler study. She was not short of breath and clinically did not fit the picture for a PE. She has not been short of breath.   I last saw her in April - she was doing ok. BP stable. Still with these "weak spells" that the etiology is unknown. Having lots of trouble trying to get a PCP. To go on Medicare this August.   In the ER at the end of June with chest pain - negative evaluation. CTA of the chest noted. No PE on that study noted but did have abnormality of the renal artery - I asked EBG to review. I then saw her following this last ER visit - she was doing better. Did have a PCP, had gotten on Medicare.   Comes back today. Here alone.  Says she is having a weak spell today while here - typically lasts about 30 minutes - always comes on after exerting. When she is out - walking - something "just comes over her" and she gets weak. Seems like she has to "push" herself to walk. Seems like this is getting worse -  certainly not better. She has had this sensation ever since her dissection surgery - this is what led to getting a Myoview and subsequent cath. She is now seeing Dr. Jeanie Cooks for her PCP out on [facility]. He has advised getting her colonoscopy, sleep study and some other test that she does not remember. She admits she eats poorly. Skips breakfasts. Does snore. Does not feel refreshed when waking up each am. Lots of GI issues noted. She has had 2 past colonoscopies.   Past Medical History:  Diagnosis Date  . Arthritis    right knee  . CAD (coronary artery disease)    a. 04/2015: cath showed 30% Ost RCA stenosis, 20% mid-LAD stenosis, and normal LV function.   . Congestive dilated cardiomyopathy ([place]) 08/01/2014  . GERD   . HEMATURIA UNSPECIFIED   . History of kidney stones   . HYPERLIPIDEMIA   . HYPERTENSION    . Morbid obesity ([place])   . MYOSITIS   . Sleep apnea    cpap-  doesnt use it- last study years ago  . Vertigo     Past Surgical History:  Procedure Laterality Date  . ABDOMINAL HYSTERECTOMY  1983  . CARDIAC CATHETERIZATION N/A 04/30/2015   Procedure: Left Heart Cath and Coronary Angiography;  Surgeon: Peter M Martinique, MD;  Location: [facility];  Service: Cardiovascular;  Laterality: N/A;  . CHOLECYSTECTOMY  2001  . CYSTOSCOPY W/ RETROGRADES Left 08/02/2015   Procedure: CYSTOSCOPY WITH LEFT RETROGRADE PYELOGRAM;  Surgeon: Festus Aloe, MD;  Location: [facility];  Service: Urology;  Laterality: Left;  . CYSTOSCOPY WITH URETEROSCOPY, STONE BASKETRY AND STENT PLACEMENT Left 08/02/2015   Procedure:  Mitzi Davenport ;  Surgeon: Festus Aloe, MD;  Location: [facility];  Service: Urology;  Laterality: Left;  . CYSTOSCOPY/URETEROSCOPY/HOLMIUM LASER/STENT PLACEMENT Left 08/02/2015   Procedure: CYSTOSCOPY/URETEROSCOPY/HOLMIUM LASER/STENT PLACEMENT-LEFT;  Surgeon: Festus Aloe, MD;  Location: [facility];  Service: Urology;  Laterality: Left;  . HOLMIUM LASER APPLICATION Left 08/22/9145   Procedure: HOLMIUM LASER APPLICATION;  Surgeon: Festus Aloe, MD;  Location: [facility];  Service: Urology;  Laterality: Left;  . REPLACEMENT ASCENDING AORTA N/A 03/03/2014   Procedure: REPAIR OF ASCENDING AORTIC DISSECTION;  Surgeon: Grace Isaac, MD;  Location: [place];  Service: Open Heart Surgery;  Laterality: N/A;     Medications: Current Meds  Medication Sig  . amLODipine (NORVASC) 10 MG tablet Take 1 tablet (10 mg total) by mouth daily.  [name] aspirin EC 81 MG EC tablet Take 1 tablet (81 mg total) by mouth daily.  [name] atorvastatin (LIPITOR) 40 MG tablet Take 1 tablet (40 mg total) by mouth daily at 6 PM.  . carvedilol (COREG) 12.5 MG tablet Take 1 tablet (12.5 mg total) by mouth 2 (two) times daily.  . Cholecalciferol 1000 units tablet Take 1,000 Units by mouth daily.  . folic acid (FOLVITE) 1 MG tablet  Take 1 mg by mouth daily.  [name] lisinopril (PRINIVIL,ZESTRIL) 40 MG tablet Take 1 tablet (40 mg total) by mouth daily.  . Omeprazole 20 MG TBEC Take 1 tablet (20 mg total) by mouth daily.  [name] spironolactone (ALDACTONE) 50 MG tablet Take 1 tablet (50 mg total) by mouth daily.  . vitamin E 400 UNIT capsule Take 400 Units by mouth daily.     Allergies: Allergies  Allergen Reactions  . Hydralazine Nausea Only  . Motrin [Ibuprofen] Nausea Only    Upset GI (motrin brand causes the side effect)    Social History: The patient  reports that she has never smoked. She has never used smokeless tobacco. She reports that she does not drink alcohol or use drugs.   Family History: The patient's family history includes Aortic dissection (age of onset: 34) in her mother; Breast cancer in her sister; Breast cancer (age of onset: 57) in her mother; Hypertension in her daughter, father, mother, and sister; Throat cancer in her brother.   Review of Systems: Please see the history of present illness.   Otherwise, the review of systems is positive for none.   All other systems are reviewed and negative.   Physical Exam: VS:  BP 118/64 (BP Location: Left Arm, Patient Position: Sitting, Cuff Size: Large)   Pulse (!) 56   Ht 5\' 3"  (1.6 m)   Wt 202 lb 12.8 oz (92 kg)   BMI 35.92 kg/m  .  BMI Body mass index is 35.92 kg/m.  Wt Readings from Last 3 Encounters:  08/31/16 202 lb 12.8 oz (92 kg)  07/14/16 201 lb 1.9 oz (91.2 kg)  07/09/16 201 lb (91.2 kg)   BP is 122/70 by me in right arm and 130/90 in the left.  General: Pleasant. Well developed, well nourished and in no acute distress.  She remains obese.  HEENT: Normal.  Neck: Supple, no JVD, carotid bruits, or masses noted.  Cardiac: Regular rate and rhythm. No murmurs, rubs, or gallops. No edema.  Respiratory:  Lungs are clear to auscultation bilaterally with normal work of breathing.  GI: Soft and nontender.  MS: No deformity or atrophy. Gait and ROM  intact.  Skin: Warm and dry. Color is normal.  Neuro:  Strength and sensation are intact and no gross focal deficits noted.  Psych: Alert, appropriate and with normal affect.   LABORATORY DATA:  EKG:  EKG is not ordered today.  Lab Results  Component Value Date   WBC 5.5 07/09/2016   HGB 11.8 (L) 07/09/2016   HCT 38.1 07/09/2016   PLT 178 07/09/2016   GLUCOSE 105 (H) 07/09/2016   CHOL 155 12/24/2015   TRIG 133 12/24/2015   HDL 53 12/24/2015   LDLCALC 75 12/24/2015   ALT 12 12/24/2015   AST 12 12/24/2015   NA 137 07/09/2016   K 3.8 07/09/2016   CL 106 07/09/2016   CREATININE 1.25 (H) 07/09/2016   BUN 17 07/09/2016   CO2 26 07/09/2016   TSH 0.78 03/08/2015   INR 0.94 04/26/2015   HGBA1C 5.8 (H) 03/07/2014     BNP (last 3 results)  Recent Labs  03/08/16 0405  BNP 16.9    ProBNP (last 3 results) No results for input(s): PROBNP in the last 8760 hours.   Other Studies Reviewed Today:  CTA CHEST IMPRESSION 06/2016: Stable aortic dissection involving the aortic arch, descending thoracic aorta and abdominal aorta. The dissection begins just beyond the tube graft repair site in the ascending thoracic aorta and involves the brachiocephalic artery which remains patent. Dissection is unchanged.  Mesenteric vessels are patent, arising from the true lumen. 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 Decreased profusion to the mid and upper pole of the left kidney. This is a stable finding when compared to study from 03/08/2016.  No significant change since prior study.  No acute findings in the chest, abdomen or pelvis.   Electronically Signed By: Rolm Baptise M.D. On: 07/09/2016 11:22      Cardiac Cath Procedures 04/2015  Left Heart Cath and Coronary Angiography    Conclusion    Ost RCA to Prox RCA lesion, 30% stenosed.  Mid LAD lesion, 20%  stenosed.  The left ventricular systolic function is normal.  1. Mild nonobstructive CAD 2. Normal LV function.  Plan: medical management.       Myoview Study Highlights4/2017    The left ventricular ejection fraction is moderately decreased (30-44%).  Nuclear stress EF: 42%.  Defect 1: There is a large defect of moderate severity.  Findings consistent with ischemia.  No T wave inversion was noted during stress.  There was no ST segment deviation noted during stress.  Large size, moderate severity reversible (SDS 9) inferolateral perfusion defect suggestive of ischemia. LVEF 42% with inferolateral hypokinesis. This is a high risk study based on the extent of ischemia.    Echo Study Conclusions from 07/2014  - Left ventricle: The cavity size was normal. There was mild focal  basal hypertrophy of the septum. Systolic function was normal.  The estimated ejection fraction was in the range of 55% to 60%.  Wall motion was normal; there were no regional wall motion  abnormalities. There was an increased relative contribution of  atrial contraction to ventricular filling. Doppler parameters are  consistent with abnormal left ventricular relaxation (grade 1  diastolic dysfunction). - Aortic valve: Trileaflet; normal thickness, mildly calcified  leaflets. There was trivial regurgitation. - Aorta: Aortic root dimension: 42 mm (ED). - Aortic root: The aortic root was mildly dilated. - Mitral valve: There was trivial regurgitation.  Assessment/Plan:  1. Prior episode of chest pain - has nonobstructive coronary disease by prior cath - has not recurred - no further work up needed.   2. HTN - BP looks great on her current regimen. Concerning about the left renal artery finding on her recent CT scan. Will ask Dr. Servando Snare for his opinion. For now, no change in her BP medicines.    3. HLD - on statin - labs from December noted. Rechecking labs today.   4.  Prior aortic dissection repair - she sees Dr. Servando Snare annually. Most recent scan noted - concern for the left renal artery - this is a new finding it appears. Will ask EBG to review as well.   5. Exertional Fatigue - had high risk Myoview - but with reassuring cardiac cath findings. I have suspected some of this is deconditioning.  She does not eat properly. Sugar today is 100. Lots of GI issues. Then tells me that she has had OSA and used to use a CPAP - she turned it back in - will see how we can get this restarted for her. Lab today as well.   6. Iron deficiency anemia - back on Folic acid. Most recent HGB is stable.  7. Prior dilated CM - last echo showed EF had recovered but was lower by recent Myoview - yet normal at time of cardiac cath - she looks good clinically and I have left her on her current regimen.          Current medicines are reviewed with the patient today.  The patient does not have concerns regarding medicines other than what has been noted above.  The following changes have been made:  See above.  Labs/ tests ordered today include:    Orders Placed This Encounter  Procedures  . Basic metabolic panel  . CBC  . Hepatic function panel  . Lipid panel  . Hemoglobin A1c  . TSH  Disposition:   FU with me in 4 months.   Patient is agreeable to this plan and will call if any problems develop in the interim.   SignedTruitt Merle, NP  08/31/2016 4:00 PM  [facility] [address] [place], [place]  [address] Phone: [phone] Fax: [phone]

## 2016-09-01 LAB — HEPATIC FUNCTION PANEL
ALT: 11 IU/L (ref 0–32)
AST: 13 IU/L (ref 0–40)
Albumin: 4.3 g/dL (ref 3.6–4.8)
Alkaline Phosphatase: 89 IU/L (ref 39–117)
Bilirubin Total: 0.2 mg/dL (ref 0.0–1.2)
Bilirubin, Direct: 0.08 mg/dL (ref 0.00–0.40)
Total Protein: 7.1 g/dL (ref 6.0–8.5)

## 2016-09-01 LAB — LIPID PANEL
Chol/HDL Ratio: 2.3 ratio (ref 0.0–4.4)
Cholesterol, Total: 125 mg/dL (ref 100–199)
HDL: 54 mg/dL (ref >39)
LDL Calculated: 54 mg/dL (ref 0–99)
Triglycerides: 83 mg/dL (ref 0–149)
VLDL Cholesterol Cal: 17 mg/dL (ref 5–40)

## 2016-09-01 LAB — TSH: TSH: 0.968 u[IU]/mL (ref 0.450–4.500)

## 2016-09-01 LAB — BASIC METABOLIC PANEL
BUN/Creatinine Ratio: 11 — ABNORMAL LOW (ref 12–28)
BUN: 11 mg/dL (ref 8–27)
CO2: 23 mmol/L (ref 20–29)
Calcium: 9.4 mg/dL (ref 8.7–10.3)
Chloride: 103 mmol/L (ref 96–106)
Creatinine, Ser: 1.01 mg/dL — ABNORMAL HIGH (ref 0.57–1.00)
GFR calc Af Amer: 69 mL/min/{1.73_m2} (ref >59)
GFR calc non Af Amer: 60 mL/min/{1.73_m2} (ref >59)
Glucose: 92 mg/dL (ref 65–99)
Potassium: 4.4 mmol/L (ref 3.5–5.2)
Sodium: 140 mmol/L (ref 134–144)

## 2016-09-01 LAB — CBC
Hematocrit: 39.5 % (ref 34.0–46.6)
Hemoglobin: 13.1 g/dL (ref 11.1–15.9)
MCH: 23.6 pg — ABNORMAL LOW (ref 26.6–33.0)
MCHC: 33.2 g/dL (ref 31.5–35.7)
MCV: 71 fL — ABNORMAL LOW (ref 79–97)
Platelets: 236 10*3/uL (ref 150–379)
RBC: 5.56 x10E6/uL — ABNORMAL HIGH (ref 3.77–5.28)
RDW: 16.3 % — ABNORMAL HIGH (ref 12.3–15.4)
WBC: 6.9 10*3/uL (ref 3.4–10.8)

## 2016-09-01 LAB — HEMOGLOBIN A1C
Est. average glucose Bld gHb Est-mCnc: 126 mg/dL
Hgb A1c MFr Bld: 6 % — ABNORMAL HIGH (ref 4.8–5.6)

## 2016-09-02 ENCOUNTER — Telehealth: Payer: Self-pay | Admitting: Nurse Practitioner

## 2016-09-02 NOTE — Telephone Encounter (Signed)
New message   Pt is returning a call from [facility]

## 2016-09-10 ENCOUNTER — Other Ambulatory Visit: Payer: Self-pay | Admitting: Nurse Practitioner

## 2016-09-10 DIAGNOSIS — I420 Dilated cardiomyopathy: Secondary | ICD-10-CM

## 2016-09-10 MED ORDER — LISINOPRIL 40 MG PO TABS: 40.0000 mg | ORAL_TABLET | Freq: Every day | ORAL | 3 refills | Status: DC

## 2016-09-10 MED ORDER — AMLODIPINE BESYLATE 10 MG PO TABS: 10.0000 mg | ORAL_TABLET | Freq: Every day | ORAL | 3 refills | Status: DC

## 2016-09-10 MED ORDER — ATORVASTATIN CALCIUM 40 MG PO TABS: 40.0000 mg | ORAL_TABLET | Freq: Every day | ORAL | 3 refills | Status: DC

## 2016-09-10 MED ORDER — CARVEDILOL 12.5 MG PO TABS: 12.5000 mg | ORAL_TABLET | Freq: Two times a day (BID) | ORAL | 3 refills | Status: DC

## 2016-09-10 MED ORDER — SPIRONOLACTONE 50 MG PO TABS: 50.0000 mg | ORAL_TABLET | Freq: Every day | ORAL | 3 refills | Status: DC

## 2016-09-10 NOTE — Telephone Encounter (Signed)
Pt's medications were sent to pt's pharmacy as requested. Pt wanted a 90 day supply. Confirmation received.

## 2016-09-16 ENCOUNTER — Telehealth: Payer: Self-pay | Admitting: *Deleted

## 2016-09-16 DIAGNOSIS — G4733 Obstructive sleep apnea (adult) (pediatric): Secondary | ICD-10-CM

## 2016-09-16 NOTE — Telephone Encounter (Signed)
Informed patient of upcoming sleep study and patient understanding was verbalized. Patient understands her sleep study is scheduled for Tuesday October 30. Patient understands her sleep study will be done at [facility] sleep lab. Patient understands she will receive a sleep packet in a week or so. Patient understands to call if she does not receive the sleep packet in a timely manner. Patient agrees with treatment and thanked me for call.

## 2016-09-16 NOTE — Telephone Encounter (Signed)
-----   Message from Sueanne Margarita, MD sent at 09/13/2016 10:52 PM EDT ----- Regarding: RE: New sleep study? She will need a split night PSG  Traci Turner ----- Message ----- From: Freada Bergeron, CMA Sent: 09/10/2016   4:02 PM To: Sueanne Margarita, MD Subject: New sleep study?                               I have looked every where she said she had a study done but I can not find one. ----- Message ----- From: Burtis Junes, NP Sent: 08/31/2016   4:10 PM To: Freada Bergeron, CMA  Cancel that last message - she tells me she was already diagnosed with OSA - used to have a CPAP - turned it back in due to money.   Now on Medicare  How do we go about getting this restarted? She is a patient of Dr. Richard Miu

## 2016-09-28 MED FILL — FOLIC ACID 1 MG TABLET: 1 | 30 days supply | Qty: 30 | Fill #2

## 2016-09-28 MED FILL — LISINOPRIL 40 MG TABLET: 40 | 30 days supply | Qty: 30 | Fill #2

## 2016-09-28 MED FILL — AMLODIPINE BESYLATE 10 MG T: 10 | 30 days supply | Qty: 30 | Fill #2

## 2016-10-12 ENCOUNTER — Other Ambulatory Visit: Payer: Self-pay | Admitting: Internal Medicine

## 2016-10-12 DIAGNOSIS — Z1239 Encounter for other screening for malignant neoplasm of breast: Secondary | ICD-10-CM

## 2016-10-13 ENCOUNTER — Ambulatory Visit
Admission: RE | Admit: 2016-10-13 | Discharge: 2016-10-13 | Disposition: A | Discharge status: Home / Self Care | Payer: Medicare Other | Source: Ambulatory Visit | Attending: Internal Medicine | Admitting: Internal Medicine

## 2016-10-13 DIAGNOSIS — Z1239 Encounter for other screening for malignant neoplasm of breast: Secondary | ICD-10-CM

## 2016-10-21 ENCOUNTER — Other Ambulatory Visit: Payer: Self-pay | Admitting: Internal Medicine

## 2016-10-21 DIAGNOSIS — E2839 Other primary ovarian failure: Secondary | ICD-10-CM

## 2016-10-26 ENCOUNTER — Encounter: Payer: Self-pay | Admitting: Gastroenterology

## 2016-11-06 ENCOUNTER — Ambulatory Visit
Admission: RE | Admit: 2016-11-06 | Discharge: 2016-11-06 | Disposition: A | Discharge status: Home / Self Care | Payer: Medicare Other | Source: Ambulatory Visit | Attending: Internal Medicine | Admitting: Internal Medicine

## 2016-11-06 DIAGNOSIS — E2839 Other primary ovarian failure: Secondary | ICD-10-CM

## 2016-11-10 ENCOUNTER — Ambulatory Visit (HOSPITAL_BASED_OUTPATIENT_CLINIC_OR_DEPARTMENT_OTHER): Payer: Medicare Other | Attending: Cardiology | Admitting: Cardiology

## 2016-11-10 VITALS — Ht 63.0 in | Wt 209.0 lb

## 2016-11-10 DIAGNOSIS — G4733 Obstructive sleep apnea (adult) (pediatric): Secondary | ICD-10-CM | POA: Diagnosis not present

## 2016-11-14 NOTE — Procedures (Signed)
   Patient Name: Alexis Wheeler, [place] Date: 11/10/2016 Gender: Female D.O.B: [birth year] Age (years): 81 Referring Provider: Fransico Him MD, ABSM Height (inches): 63 Interpreting Physician: Fransico Him MD, ABSM Weight (lbs): 209 RPSGT: Carolin Coy BMI: 37 MRN: [id number] Neck Size: 13.50  CLINICAL INFORMATION Sleep Study Type: NPSG  Indication for sleep study: Fatigue, Hypertension, Obesity, OSA  Epworth Sleepiness Score: 10  SLEEP STUDY TECHNIQUE As per the AASM Manual for the Scoring of Sleep and Associated Events v2.3 (April 2016) with a hypopnea requiring 4% desaturations.  The channels recorded and monitored were frontal, central and occipital EEG, electrooculogram (EOG), submentalis EMG (chin), nasal and oral airflow, thoracic and abdominal wall motion, anterior tibialis EMG, snore microphone, electrocardiogram, and pulse oximetry.  MEDICATIONS Medications self-administered by patient taken the night of the study : N/A  SLEEP ARCHITECTURE The study was initiated at 9:55:15 PM and ended at 4:33:22 AM.  Sleep onset time was 31.6 minutes and the sleep efficiency was 80.9%. The total sleep time was 322.0 minutes.  Stage REM latency was 144.0 minutes.  The patient spent 9.47% of the night in stage N1 sleep, 79.19% in stage N2 sleep, 0.00% in stage N3 and 11.34% in REM.  Alpha intrusion was absent.  Supine sleep was 66.46%.  RESPIRATORY PARAMETERS The overall apnea/hypopnea index (AHI) was 5.2 per hour. There were 6 total apneas, including 6 obstructive, 0 central and 0 mixed apneas. There were 22 hypopneas and 13 RERAs.  The AHI during Stage REM sleep was 41.1 per hour.  AHI while supine was 7.6 per hour.  The mean oxygen saturation was 96.21%. The minimum SpO2 during sleep was 85.00%.  moderate snoring was noted during this study.  CARDIAC DATA The 2 lead EKG demonstrated sinus rhythm. The mean heart rate was 60.58 beats per minute. Other EKG  findings include: None  LEG MOVEMENT DATA The total PLMS were 31 with a resulting PLMS index of 5.78. Associated arousal with leg movement index was 1.7 .  IMPRESSIONS - Mild obstructive sleep apnea occurred during this study (AHI = 5.2/h) overall but severe during REM sleep with an AHI of 41.1/hr. - No significant central sleep apnea occurred during this study (CAI = 0.0/h). - Moderate oxygen desaturation was noted during this study (Min O2 = 85.00%). - The patient snored with moderate snoring volume. - Mild periodic limb movements of sleep occurred during the study. No significant associated arousals.  DIAGNOSIS - Obstructive Sleep Apnea (327.23 [G47.33 ICD-10])  RECOMMENDATIONS - Positional therapy avoiding supine position during sleep. - Although patient has only mild obstructive sleep apnea overall, during REM sleep it is severe with oxygen desaturations as low as 85%.  Recommend CPAP titration.  - Avoid alcohol, sedatives and other CNS depressants that may worsen sleep apnea and disrupt normal sleep architecture. - Sleep hygiene should be reviewed to assess factors that may improve sleep quality. - Weight management and regular exercise should be initiated or continued if appropriate.  [place], American Board of Sleep Medicine  ELECTRONICALLY SIGNED ON:  11/14/2016, 8:57 PM [place] PH: [phone]   FX: [phone] [place]

## 2016-11-18 ENCOUNTER — Telehealth: Payer: Self-pay | Admitting: *Deleted

## 2016-11-18 DIAGNOSIS — I119 Hypertensive heart disease without heart failure: Secondary | ICD-10-CM

## 2016-11-18 NOTE — Telephone Encounter (Signed)
-----   Message from Sueanne Margarita, MD sent at 11/14/2016  8:59 PM EDT ----- Please let patient know that they have sleep apnea and recommend CPAP titration. Please set up titration in the sleep lab.

## 2016-11-18 NOTE — Telephone Encounter (Signed)
Informed patient of sleep study results and patient understanding was verbalized. Patient understands she has mild sleep apnea and Dr Radford Pax recommends a CPAP titration.   Patient understands she stopped breathing 5.2/hr and during deep sleep she quit breathing 41.1/hr. Patient agrees with treatment and thanked me for calling.

## 2016-11-18 NOTE — Telephone Encounter (Deleted)
-----   Message from Sueanne Margarita, MD sent at 11/14/2016  8:59 PM EDT ----- Please let patient know that they have sleep apnea and recommend CPAP titration. Please set up titration in the sleep lab.

## 2016-12-16 ENCOUNTER — Telehealth: Payer: Self-pay | Admitting: Nurse Practitioner

## 2016-12-16 ENCOUNTER — Encounter: Payer: Self-pay | Admitting: Gastroenterology

## 2016-12-16 ENCOUNTER — Ambulatory Visit (INDEPENDENT_AMBULATORY_CARE_PROVIDER_SITE_OTHER): Payer: Medicare Other | Admitting: Gastroenterology

## 2016-12-16 ENCOUNTER — Telehealth: Payer: Self-pay | Admitting: Gastroenterology

## 2016-12-16 VITALS — BP 132/84 | HR 68 | Ht 63.0 in | Wt 202.0 lb

## 2016-12-16 DIAGNOSIS — R109 Unspecified abdominal pain: Secondary | ICD-10-CM

## 2016-12-16 DIAGNOSIS — R152 Fecal urgency: Secondary | ICD-10-CM | POA: Diagnosis not present

## 2016-12-16 DIAGNOSIS — Z800 Family history of malignant neoplasm of digestive organs: Secondary | ICD-10-CM | POA: Diagnosis not present

## 2016-12-16 DIAGNOSIS — R197 Diarrhea, unspecified: Secondary | ICD-10-CM

## 2016-12-16 MED ORDER — DICYCLOMINE HCL 20 MG PO TABS: 20.0000 mg | ORAL_TABLET | Freq: Three times a day (TID) | ORAL | 2 refills | Status: DC

## 2016-12-16 MED ORDER — COLESTIPOL HCL 1 G PO TABS: 1.0000 g | ORAL_TABLET | Freq: Two times a day (BID) | ORAL | 3 refills | Status: DC

## 2016-12-16 MED ORDER — HYOSCYAMINE SULFATE 0.125 MG SL SUBL: 0.1250 mg | SUBLINGUAL_TABLET | Freq: Two times a day (BID) | SUBLINGUAL | 3 refills | Status: DC

## 2016-12-16 MED ORDER — NA SULFATE-K SULFATE-MG SULF 17.5-3.13-1.6 GM/177ML PO SOLN: 1.0000 | Freq: Once | ORAL | 0 refills | Status: AC

## 2016-12-16 NOTE — Telephone Encounter (Signed)
Please advise on cheaper antispasmodic Robinul??

## 2016-12-16 NOTE — Telephone Encounter (Signed)
Pt aware recall was for the combination pill not  the Amlodipine script .Alexis Wheeler

## 2016-12-16 NOTE — Telephone Encounter (Signed)
Called pt to inform new med sent to pharmacy

## 2016-12-16 NOTE — Addendum Note (Signed)
Addended by: Oda Kilts on: 12/16/2016 04:42 PM   Modules accepted: Orders

## 2016-12-16 NOTE — Progress Notes (Signed)
Alexis Wheeler    [id number]    [birth year]  Primary Care Physician:Gerhardt, Marlane Hatcher, NP  Referring Physician: Burtis Junes, NP [address] [place], [place] [address]  Chief complaint: Fecal urgency HPI:  61 year old female with history of type I aortic dissection February 2016 status post repair with replacement of ascending aorta and resuspension of aortic valve, hypertension hyperlipidemia, obesity here for evaluation with complaints of worsening fecal urgency and diarrhea after every meal .  She has had chronic symptoms, getting worse in the past 1-2 years.  She has 2-3 bowel movements daily with semi-formed to formed stool. Patient denies any abdominal pain but she has cramping and gurgling after meals associated with fecal urgency and diarrhea .  Denies any postprandial fullness or abdominal pain.  No dysphagia, odynophagia or heartburn.  Complains of bloating and excessive gas with flatulence Her brother passed away from colon cancer in his 54s .  Last colonoscopy in 2009 , report is not available to review during this visit .  It was normal per patient .  Denies any nausea, vomiting, abdominal pain, melena or bright red blood per rectum    Outpatient Encounter Medications as of 12/16/2016  Medication Sig  . amLODipine (NORVASC) 10 MG tablet Take 1 tablet (10 mg total) by mouth daily.  [name] aspirin EC 81 MG EC tablet Take 1 tablet (81 mg total) by mouth daily.  [name] atorvastatin (LIPITOR) 40 MG tablet Take 1 tablet (40 mg total) by mouth daily at 6 PM.  . carvedilol (COREG) 12.5 MG tablet Take 1 tablet (12.5 mg total) by mouth 2 (two) times daily.  . Cholecalciferol 1000 units tablet Take 1,000 Units by mouth daily.  . folic acid (FOLVITE) 1 MG tablet Take 1 mg by mouth daily.  [name] lisinopril (PRINIVIL,ZESTRIL) 40 MG tablet Take 1 tablet (40 mg total) by mouth daily.  . Omeprazole 20 MG TBEC Take 1 tablet (20 mg total) by mouth daily.  [name] spironolactone  (ALDACTONE) 50 MG tablet Take 1 tablet (50 mg total) by mouth daily.  . vitamin E 400 UNIT capsule Take 400 Units by mouth daily.   No facility-administered encounter medications on file as of 12/16/2016.     Allergies as of 12/16/2016 - Review Complete 10/20/2016  Allergen Reaction Noted  . Hydralazine Nausea Only 04/16/2015  . Motrin [ibuprofen] Nausea Only     Past Medical History:  Diagnosis Date  . Arthritis    right knee  . CAD (coronary artery disease)    a. 04/2015: cath showed 30% Ost RCA stenosis, 20% mid-LAD stenosis, and normal LV function.   . Congestive dilated cardiomyopathy ([place]) 08/01/2014  . GERD   . HEMATURIA UNSPECIFIED   . History of kidney stones   . HYPERLIPIDEMIA   . HYPERTENSION   . Morbid obesity ([place])   . MYOSITIS   . Sleep apnea    cpap-  doesnt use it- last study years ago  . Vertigo     Past Surgical History:  Procedure Laterality Date  . ABDOMINAL HYSTERECTOMY  1983  . CARDIAC CATHETERIZATION N/A 04/30/2015   Procedure: Left Heart Cath and Coronary Angiography;  Surgeon: Peter M Martinique, MD;  Location: [facility];  Service: Cardiovascular;  Laterality: N/A;  . CHOLECYSTECTOMY  2001  . CYSTOSCOPY W/ RETROGRADES Left 08/02/2015   Procedure: CYSTOSCOPY WITH LEFT RETROGRADE PYELOGRAM;  Surgeon: Festus Aloe, MD;  Location: [facility];  Service: Urology;  Laterality: Left;  . CYSTOSCOPY WITH URETEROSCOPY, STONE BASKETRY AND STENT PLACEMENT Left 08/02/2015   Procedure:  Mitzi Davenport ;  Surgeon: Festus Aloe, MD;  Location: [facility];  Service: Urology;  Laterality: Left;  . CYSTOSCOPY/URETEROSCOPY/HOLMIUM LASER/STENT PLACEMENT Left 08/02/2015   Procedure: CYSTOSCOPY/URETEROSCOPY/HOLMIUM LASER/STENT PLACEMENT-LEFT;  Surgeon: Festus Aloe, MD;  Location: [facility];  Service: Urology;  Laterality: Left;  . HOLMIUM LASER APPLICATION Left 4/80/1655   Procedure: HOLMIUM LASER APPLICATION;  Surgeon: Festus Aloe, MD;  Location: [facility];  Service:  Urology;  Laterality: Left;  . REPLACEMENT ASCENDING AORTA N/A 03/03/2014   Procedure: REPAIR OF ASCENDING AORTIC DISSECTION;  Surgeon: Grace Isaac, MD;  Location: [facility];  Service: Open Heart Surgery;  Laterality: N/A;    Family History  Problem Relation Age of Onset  . Breast cancer Mother 40  . Aortic dissection Mother 32  . Hypertension Mother   . Throat cancer Brother        1 bro living  . Hypertension Father   . Breast cancer Sister   . Hypertension Sister   . Hypertension Daughter   . Seizures Neg Hx     Social History   Socioeconomic History  . Marital status: Divorced    Spouse name: Not on file  . Number of children: Not on file  . Years of education: Not on file  . Highest education level: Not on file  Social Needs  . Financial resource strain: Not on file  . Food insecurity - worry: Not on file  . Food insecurity - inability: Not on file  . Transportation needs - medical: Not on file  . Transportation needs - non-medical: Not on file  Occupational History  . Not on file  Tobacco Use  . Smoking status: Never Smoker  . Smokeless tobacco: Never Used  . 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 Alcohol use: No  . Drug use: No  . Sexual activity: No  Other Topics Concern  . Not on file  Social History Narrative  . Not on file      Review of systems: Review of Systems  Constitutional: Negative for fever and chills.  HENT: Negative.   Eyes: Negative for blurred vision.  Respiratory: Negative for cough, shortness of breath and wheezing.   Cardiovascular: Negative for chest pain and palpitations.  Gastrointestinal: as per HPI Genitourinary: Negative for dysuria, urgency, frequency and hematuria.  Musculoskeletal: Positive for myalgias, back pain and joint pain.  Skin: Negative for itching and rash.  Neurological: Negative for dizziness,  tremors, focal weakness, seizures and loss of consciousness. Positive for headaches Endo/Heme/Allergies: Positive for seasonal allergies.  Psychiatric/Behavioral: Negative for depression, suicidal ideas and hallucinations.  All other systems reviewed and are negative.   Physical Exam: Vitals:   12/16/16 0920  BP: 132/84  Pulse: 68   Body mass index is 35.78 kg/m. Gen:      No acute distress HEENT:  EOMI, sclera anicteric Neck:     No masses; no thyromegaly Lungs:    Clear to auscultation bilaterally; normal respiratory effort CV:         Regular rate and rhythm; no murmurs Abd:      + bowel sounds; soft, non-tender; no palpable masses, no distension Ext:    No edema; adequate peripheral perfusion Skin:      Warm and dry; no rash Neuro:  alert and oriented x 3 Psych: normal mood and affect  Data Reviewed:  Reviewed labs, radiology imaging, old records and pertinent past GI work up  Cardiac catheterization and coronary angiography April 30, 2015  Ost RCA to Prox RCA lesion, 30% stenosed.  Mid LAD lesion, 20% stenosed.  The left ventricular systolic function is normal.   1. Mild nonobstructive CAD 2. Normal LV function.  CT angios chest abdomen and pelvis July 09, 2016 Stable aortic dissection involving the aortic arch, descending thoracic aorta and abdominal aorta. The dissection begins just beyond the tube graft repair site in the ascending thoracic aorta and involves the brachiocephalic artery which remains patent. Dissection is unchanged.  Mesenteric vessels are patent, arising from the true lumen. 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 Decreased profusion to the mid and upper pole of the left kidney. This is a stable finding when compared to study from 03/08/2016.  No significant change since prior study.  No acute findings in the chest, abdomen or pelvis.  Assessment  and Plan/Recommendations:  61 year old female with history of hypertension, obesity, type I aortic dissection status post replacement of ascending aorta and resuspension of aortic valve, family history of colon cancer here with complaints of fecal urgency and diarrhea Benefiber 1 tablespoon 3 times daily with meals Will do a trial of Colestid 2 g twice daily She was prescribed dicyclomine 10 mg twice daily, reports some improvement but continues to have lower abdominal cramps. Advised patient to try Levsin, if no significant difference can continue dicyclomine Schedule for colonoscopy, past due for colorectal cancer screening We will also obtain random colon biopsies to rule out microscopic colitis The risks and benefits as well as alternatives of endoscopic procedure(s) have been discussed and reviewed. All questions answered. The patient agrees to proceed.   Damaris Hippo , MD [phone] Mon-Fri 8a-5p [phone] after 5p, weekends, holidays  CC: Burtis Junes, NP

## 2016-12-16 NOTE — Telephone Encounter (Signed)
She can continue Dicyclomine , increase dose to 20mg  Q8h PRN X 30 days with 3 refills. Thanks

## 2016-12-16 NOTE — Telephone Encounter (Signed)
New Message  Pt c/o medication issue:  1. Name of Medication: Amlodipine   2. How are you currently taking this medication (dosage and times per day)? 10mg    3. Are you having a reaction (difficulty breathing--STAT)? no  4. What is your medication issue? recall

## 2016-12-16 NOTE — Patient Instructions (Addendum)
You have been scheduled for a colonoscopy. Please follow written instructions given to you at your visit today.  Please pick up your prep supplies at the pharmacy within the next 1-3 days. If you use inhalers (even only as needed), please bring them with you on the day of your procedure.   We will send in Levsin and Colestid to your pharmacy  Benifiber 1 tablespoon three times a day with meals   If you are age 62 or older, your body mass index should be between 23-30. Your Body mass index is 35.78 kg/m. If this is out of the aforementioned range listed, please consider follow up with your Primary Care Provider.  If you are age 8 or younger, your body mass index should be between 19-25. Your Body mass index is 35.78 kg/m. If this is out of the aformentioned range listed, please consider follow up with your Primary Care Provider.

## 2016-12-28 ENCOUNTER — Telehealth: Payer: Self-pay | Admitting: Nurse Practitioner

## 2016-12-28 NOTE — Telephone Encounter (Signed)
New message    Pt came in today asking for a call from nurse. She said she had a question about a sleep study. Please call.

## 2016-12-28 NOTE — Telephone Encounter (Signed)
Returned patient call to [phone] @ 4:22 p but no voicemail was set up.

## 2016-12-29 ENCOUNTER — Encounter: Payer: Self-pay | Admitting: *Deleted

## 2016-12-29 NOTE — Telephone Encounter (Signed)
Informed patient of Titration and patient understanding was verbalized. Patient understands her Titration is scheduled for Sunday January 31 2017. Patient understands her Titration will be done at [facility] sleep lab. Patient understands she will receive a sleep packet in a week or so. Patient understands to call if she does not receive the sleep packet in a timely manner. Patient agrees with treatment and thanked me for call.

## 2016-12-29 NOTE — Telephone Encounter (Signed)
Informed patient of Titration and patient understanding was verbalized. Patient understands her Titration is scheduled for Sunday January 31 2017. Patient understands her Titration will be done at [facility] sleep lab. Patient understands she will receive a sleep packet in a week or so. Patient understands to call if she does not receive the sleep packet in a timely manner. Patient agrees with treatment and thanked me for call

## 2016-12-30 ENCOUNTER — Ambulatory Visit: Payer: Medicare Other | Admitting: Nurse Practitioner

## 2017-01-20 ENCOUNTER — Ambulatory Visit (AMBULATORY_SURGERY_CENTER): Payer: Medicare Other | Admitting: Gastroenterology

## 2017-01-20 ENCOUNTER — Other Ambulatory Visit: Payer: Self-pay

## 2017-01-20 ENCOUNTER — Encounter: Payer: Self-pay | Admitting: Gastroenterology

## 2017-01-20 VITALS — BP 121/73 | HR 56 | Temp 97.5°F | Resp 10 | Ht 62.0 in | Wt 202.0 lb

## 2017-01-20 DIAGNOSIS — Z800 Family history of malignant neoplasm of digestive organs: Secondary | ICD-10-CM | POA: Diagnosis not present

## 2017-01-20 DIAGNOSIS — D123 Benign neoplasm of transverse colon: Secondary | ICD-10-CM

## 2017-01-20 DIAGNOSIS — R197 Diarrhea, unspecified: Secondary | ICD-10-CM

## 2017-01-20 MED ORDER — POLYETHYLENE GLYCOL 3350 17 GM/SCOOP PO POWD: ORAL | 11 refills | Status: DC

## 2017-01-20 MED ORDER — SODIUM CHLORIDE 0.9 % IV SOLN: 500.0000 mL | INTRAVENOUS | Status: DC

## 2017-01-20 NOTE — Progress Notes (Signed)
Called to room to assist during endoscopic procedure.  Patient ID and intended procedure confirmed with present staff. Received instructions for my participation in the procedure from the performing physician.  

## 2017-01-20 NOTE — Progress Notes (Signed)
A and O x3. Report to RN. Tolerated MAC anesthesia well.

## 2017-01-20 NOTE — Op Note (Signed)
[facility] Patient Name: Alexis Wheeler Procedure Date: 01/20/2017 8:08 AM MRN: [id number] Endoscopist: Mauri Pole , MD Age: 62 Referring MD:  Date of Birth: 10-22-1955 Gender: Female Account #: [id number] Procedure:                Colonoscopy Indications:              Clinically significant diarrhea of unexplained                            origin. Family history of colon cancer in Brother Medicines:                Monitored Anesthesia Care Procedure:                Pre-Anesthesia Assessment:                           - Prior to the procedure, a History and Physical                            was performed, and patient medications and                            allergies were reviewed. The patient's tolerance of                            previous anesthesia was also reviewed. The risks                            and benefits of the procedure and the sedation                            options and risks were discussed with the patient.                            All questions were answered, and informed consent                            was obtained. Prior Anticoagulants: The patient has                            taken no previous anticoagulant or antiplatelet                            agents. ASA Grade Assessment: III - A patient with                            severe systemic disease. After reviewing the risks                            and benefits, the patient was deemed in                            satisfactory condition to undergo the procedure.  After obtaining informed consent, the colonoscope                            was passed under direct vision. Throughout the                            procedure, the patient's blood pressure, pulse, and                            oxygen saturations were monitored continuously. The                            Colonoscope was introduced through the anus and   advanced to the the cecum, identified by                            appendiceal orifice and ileocecal valve. The                            colonoscopy was performed without difficulty. The                            patient tolerated the procedure well. The quality                            of the bowel preparation was poor. The ileocecal                            valve, appendiceal orifice, and rectum were                            photographed. Scope In: 8:10:04 AM Scope Out: 8:23:12 AM Scope Withdrawal Time: 0 hours 8 minutes 16 seconds  Total Procedure Duration: 0 hours 13 minutes 8 seconds  Findings:                 The perianal and digital rectal examinations were                            normal.                           A large amount of stool was found in the entire                            colon, interfering with visualization.                           Normal mucosa was found in the entire colon where                            visualized. Biopsies for histology were taken with                            a cold forceps from the right colon and left colon  for evaluation of microscopic colitis.                           A 7 mm polyp was found in the transverse colon. The                            polyp was sessile. The polyp was removed with a                            cold snare. Resection and retrieval were complete.                           Non-bleeding internal hemorrhoids were found during                            retroflexion. The hemorrhoids were small. Complications:            No immediate complications. Estimated Blood Loss:     Estimated blood loss was minimal. Impression:               - Preparation of the colon was poor.                           - Stool in the entire examined colon.                           - Normal mucosa in the entire examined colon.                            Biopsied.                           - One 7  mm polyp in the transverse colon, removed                            with a cold snare. Resected and retrieved.                           - Non-bleeding internal hemorrhoids. Recommendation:           - Patient has a contact number available for                            emergencies. The signs and symptoms of potential                            delayed complications were discussed with the                            patient. Return to normal activities tomorrow.                            Written discharge instructions were provided to the  patient.                           - Resume previous diet.                           - Continue present medications.                           - Await pathology results.                           - Repeat colonoscopy at the next available                            appointment because the bowel preparation was                            suboptimal.                           - For future colonoscopy the patient will require                            an extended preparation. If there are any                            questions, please contact the gastroenterologist. Mauri Pole, MD 01/20/2017 8:38:01 AM This report has been signed electronically.

## 2017-01-20 NOTE — Patient Instructions (Addendum)
YOU HAD AN ENDOSCOPIC PROCEDURE TODAY AT [facility]:   Refer to the procedure report that was given to you for any specific questions about what was found during the examination.  If the procedure report does not answer your questions, please call your gastroenterologist to clarify.  If you requested that your care partner not be given the details of your procedure findings, then the procedure report has been included in a sealed envelope for you to review at your convenience later.  YOU SHOULD EXPECT: Some feelings of bloating in the abdomen. Passage of more gas than usual.  Walking can help get rid of the air that was put into your GI tract during the procedure and reduce the bloating. If you had a lower endoscopy (such as a colonoscopy or flexible sigmoidoscopy) you may notice spotting of blood in your stool or on the toilet paper. If you underwent a bowel prep for your procedure, you may not have a normal bowel movement for a few days.  Please Note:  You might notice some irritation and congestion in your nose or some drainage.  This is from the oxygen used during your procedure.  There is no need for concern and it should clear up in a day or so.  SYMPTOMS TO REPORT IMMEDIATELY:   Following lower endoscopy (colonoscopy or flexible sigmoidoscopy):  Excessive amounts of blood in the stool  Significant tenderness or worsening of abdominal pains  Swelling of the abdomen that is new, acute  Fever of 100F or higher  For urgent or emergent issues, a gastroenterologist can be reached at any hour by calling [phone].   DIET:  We do recommend a small meal at first, but then you may proceed to your regular diet.  Drink plenty of fluids but you should avoid alcoholic beverages for 24 hours.  ACTIVITY:  You should plan to take it easy for the rest of today and you should NOT DRIVE or use heavy machinery until tomorrow (because of the sedation medicines used during the test).     FOLLOW UP: Our staff will call the number listed on your records the next business day following your procedure to check on you and address any questions or concerns that you may have regarding the information given to you following your procedure. If we do not reach you, we will leave a message.  However, if you are feeling well and you are not experiencing any problems, there is no need to return our call.  We will assume that you have returned to your regular daily activities without incident.  If any biopsies were taken you will be contacted by phone or by letter within the next 1-3 weeks.  Please call us at [phone] if you have not heard about the biopsies in 3 weeks.   Await for biopsy results to determine next repeat Colonoscopy Polyps (handout given) Hemorrhoids (handout given)  SIGNATURES/CONFIDENTIALITY: You and/or your care partner have signed paperwork which will be entered into your electronic medical record.  These signatures attest to the fact that that the information above on your After Visit Summary has been reviewed and is understood.  Full responsibility of the confidentiality of this discharge information lies with you and/or your care-partner.

## 2017-01-21 ENCOUNTER — Telehealth: Payer: Self-pay | Admitting: *Deleted

## 2017-01-21 NOTE — Telephone Encounter (Signed)
  Follow up Call-  Call back number 01/20/2017  Post procedure Call Back phone  # [phone]  Permission to leave phone message Yes  Some recent data might be hidden     Patient questions:  Do you have a fever, pain , or abdominal swelling? No. Pain Score  0 *  Have you tolerated food without any problems? Yes.    Have you been able to return to your normal activities? Yes.    Do you have any questions about your discharge instructions: Diet   No. Medications  No. Follow up visit  No.  Do you have questions or concerns about your Care? No.  Actions: * If pain score is 4 or above: No action needed, pain <4.

## 2017-01-26 ENCOUNTER — Other Ambulatory Visit: Payer: Self-pay

## 2017-01-26 ENCOUNTER — Ambulatory Visit (AMBULATORY_SURGERY_CENTER): Payer: Self-pay

## 2017-01-26 VITALS — Ht 63.0 in | Wt 203.0 lb

## 2017-01-26 DIAGNOSIS — R197 Diarrhea, unspecified: Secondary | ICD-10-CM

## 2017-01-26 MED ORDER — NA SULFATE-K SULFATE-MG SULF 17.5-3.13-1.6 GM/177ML PO SOLN
1.0000 | Freq: Once | ORAL | 0 refills | Status: AC
Start: 2017-01-26 — End: 2017-01-26

## 2017-01-26 NOTE — Progress Notes (Signed)
Denies allergies to eggs or soy products. Denies complication of anesthesia or sedation. Denies use of weight loss medication. Denies use of O2.   Emmi instructions declined.  

## 2017-01-27 ENCOUNTER — Encounter: Payer: Self-pay | Admitting: Gastroenterology

## 2017-01-31 ENCOUNTER — Ambulatory Visit (HOSPITAL_BASED_OUTPATIENT_CLINIC_OR_DEPARTMENT_OTHER): Payer: Medicare Other | Attending: Cardiology | Admitting: Cardiology

## 2017-01-31 VITALS — Ht 63.0 in | Wt 209.0 lb

## 2017-01-31 DIAGNOSIS — G4733 Obstructive sleep apnea (adult) (pediatric): Secondary | ICD-10-CM | POA: Insufficient documentation

## 2017-01-31 DIAGNOSIS — I119 Hypertensive heart disease without heart failure: Secondary | ICD-10-CM

## 2017-02-01 NOTE — Procedures (Signed)
   NAME: Alexis Wheeler DATE OF BIRTH:  April 22, 1955 MEDICAL RECORD NUMBER [id number]  LOCATION: [facility]  PHYSICIAN: Naeem Quillin  DATE OF STUDY: 01/31/2017  SLEEP STUDY TYPE: Positive Airway Pressure Titration               REFERRING PHYSICIAN: Sueanne Margarita, MD   CLINICAL INFORMATION The patient is referred for a CPAP titration to treat sleep apnea.  SLEEP STUDY TECHNIQUE As per the AASM Manual for the Scoring of Sleep and Associated Events v2.3 (April 2016) with a hypopnea requiring 4% desaturations.  The channels recorded and monitored were frontal, central and occipital EEG, electrooculogram (EOG), submentalis EMG (chin), nasal and oral airflow, thoracic and abdominal wall motion, anterior tibialis EMG, snore microphone, electrocardiogram, and pulse oximetry. Continuous positive airway pressure (CPAP) was initiated at the beginning of the study and titrated to treat sleep-disordered breathing.  MEDICATIONS Medications self-administered by patient taken the night of the study : N/A  TECHNICIAN COMMENTS Comments added by technician: NO RESTROOM VISTED. Patient was restless all through the night. Comments added by scorer: N/A  RESPIRATORY PARAMETERS Optimal PAP Pressure (cm):12  AHI at Optimal Pressure (/hr):0.0 Overall Minimal O2 (%):91.00  Supine % at Optimal Pressure (%):100 Minimal O2 at Optimal Pressure (%): 95.0   SLEEP ARCHITECTURE The study was initiated at 9:52:12 PM and ended at 4:32:26 AM.  Sleep onset time was 11.8 minutes and the sleep efficiency was 69.6%. The total sleep time was 278.4 minutes.  The patient spent 6.46% of the night in stage N1 sleep, 79.56% in stage N2 sleep, 0.00% in stage N3 and 13.98% in REM.Stage REM latency was 171.5 minutes  Wake after sleep onset was 110.0. Alpha intrusion was absent. Supine sleep was 97.49%.  CARDIAC DATA The 2 lead EKG demonstrated sinus rhythm. The mean heart rate was 60.60 beats per  minute. Other EKG findings include: None.  LEG MOVEMENT DATA The total Periodic Limb Movements of Sleep (PLMS) were 0. The PLMS index was 0.00. A PLMS index of <15 is considered normal in adults.  IMPRESSIONS - The optimal PAP pressure was 12 cm of water. - Central sleep apnea was not noted during this titration (CAI = 0.0/h). - Significant oxygen desaturations were not observed during this titration (min O2 = 91.00%). - No snoring was audible during this study. - No cardiac abnormalities were observed during this study. - Clinically significant periodic limb movements were not noted during this study. Arousals associated with PLMs were rare.  DIAGNOSIS - Obstructive Sleep Apnea (327.23 [G47.33 ICD-10])  RECOMMENDATIONS - Trial of CPAP therapy on 12 cm H2O with a Small size Resmed Full Face Mask AirFit F20 mask and heated humidification. - Avoid alcohol, sedatives and other CNS depressants that may worsen sleep apnea and disrupt normal sleep architecture. - Sleep hygiene should be reviewed to assess factors that may improve sleep quality. - Weight management and regular exercise should be initiated or continued. - Return to Sleep Center for re-evaluation after 10 weeks of therapy  [place], [facility]  ELECTRONICALLY SIGNED ON:  02/01/2017, 3:53 PM [place] PH: [phone]   FX: [phone] [place]

## 2017-02-08 ENCOUNTER — Encounter: Payer: Self-pay | Admitting: Gastroenterology

## 2017-02-08 ENCOUNTER — Ambulatory Visit (AMBULATORY_SURGERY_CENTER): Payer: Medicare Other | Admitting: Gastroenterology

## 2017-02-08 VITALS — BP 128/56 | HR 56 | Temp 97.1°F | Resp 12

## 2017-02-08 DIAGNOSIS — K635 Polyp of colon: Secondary | ICD-10-CM

## 2017-02-08 DIAGNOSIS — Z86010 Personal history of colonic polyps: Secondary | ICD-10-CM

## 2017-02-08 DIAGNOSIS — Z1211 Encounter for screening for malignant neoplasm of colon: Secondary | ICD-10-CM

## 2017-02-08 DIAGNOSIS — D125 Benign neoplasm of sigmoid colon: Secondary | ICD-10-CM

## 2017-02-08 MED ORDER — SODIUM CHLORIDE 0.9 % IV SOLN: 500.0000 mL | Freq: Once | INTRAVENOUS | Status: DC

## 2017-02-08 NOTE — Progress Notes (Signed)
Called to room to assist during endoscopic procedure.  Patient ID and intended procedure confirmed with present staff. Received instructions for my participation in the procedure from the performing physician.  

## 2017-02-08 NOTE — Op Note (Signed)
[facility] Patient Name: Alexis Wheeler Procedure Date: 02/08/2017 1:14 PM MRN: [id number] Endoscopist: Mauri Pole , MD Age: 62 Referring MD:  Date of Birth: 09/05/1955 Gender: Female Account #: [id number] Procedure:                Colonoscopy Indications:              High risk colon cancer surveillance: Personal                            history of colonic polyps, High risk colon cancer                            surveillance: Personal history of adenoma less than                            10 mm in size Medicines:                Monitored Anesthesia Care Procedure:                Pre-Anesthesia Assessment:                           - Prior to the procedure, a History and Physical                            was performed, and patient medications and                            allergies were reviewed. The patient's tolerance of                            previous anesthesia was also reviewed. The risks                            and benefits of the procedure and the sedation                            options and risks were discussed with the patient.                            All questions were answered, and informed consent                            was obtained. Prior Anticoagulants: The patient has                            taken no previous anticoagulant or antiplatelet                            agents. ASA Grade Assessment: III - A patient with                            severe systemic disease. After reviewing the risks  and benefits, the patient was deemed in                            satisfactory condition to undergo the procedure.                           After obtaining informed consent, the colonoscope                            was passed under direct vision. Throughout the                            procedure, the patient's blood pressure, pulse, and                            oxygen saturations were monitored  continuously. The                            Colonoscope was introduced through the anus and                            advanced to the the cecum, identified by                            appendiceal orifice and ileocecal valve. The                            colonoscopy was performed without difficulty. The                            patient tolerated the procedure well. The quality                            of the bowel preparation was excellent. The                            ileocecal valve, appendiceal orifice, and rectum                            were photographed. Scope In: 1:22:31 PM Scope Out: 1:36:36 PM Scope Withdrawal Time: 0 hours 9 minutes 59 seconds  Total Procedure Duration: 0 hours 14 minutes 5 seconds  Findings:                 The perianal and digital rectal examinations were                            normal.                           A 4 mm polyp was found in the sigmoid colon. The                            polyp was sessile. The polyp was removed with a  cold snare. Resection and retrieval were complete.                           Non-bleeding internal hemorrhoids were found during                            retroflexion. The hemorrhoids were small.                           The exam was otherwise without abnormality. Complications:            No immediate complications. Estimated Blood Loss:     Estimated blood loss was minimal. Estimated blood                            loss was minimal. Impression:               - One 4 mm polyp in the sigmoid colon, removed with                            a cold snare. Resected and retrieved.                           - Non-bleeding internal hemorrhoids.                           - The examination was otherwise normal. Recommendation:           - Patient has a contact number available for                            emergencies. The signs and symptoms of potential                            delayed  complications were discussed with the                            patient. Return to normal activities tomorrow.                            Written discharge instructions were provided to the                            patient.                           - Resume previous diet.                           - Continue present medications.                           - Await pathology results.                           - Repeat colonoscopy in 5 years for surveillance  based on pathology results. Mauri Pole, MD 02/08/2017 1:42:43 PM This report has been signed electronically.

## 2017-02-08 NOTE — Progress Notes (Signed)
Report given to PACU, vss 

## 2017-02-08 NOTE — Progress Notes (Signed)
I have reviewed the patient's medical history in detail and updated the computerized patient record.

## 2017-02-08 NOTE — Patient Instructions (Signed)
*  Handouts given to patient on hemorrhoids and polyps*  YOU HAD AN ENDOSCOPIC PROCEDURE TODAY AT [facility]:   Refer to the procedure report that was given to you for any specific questions about what was found during the examination.  If the procedure report does not answer your questions, please call your gastroenterologist to clarify.  If you requested that your care partner not be given the details of your procedure findings, then the procedure report has been included in a sealed envelope for you to review at your convenience later.  YOU SHOULD EXPECT: Some feelings of bloating in the abdomen. Passage of more gas than usual.  Walking can help get rid of the air that was put into your GI tract during the procedure and reduce the bloating. If you had a lower endoscopy (such as a colonoscopy or flexible sigmoidoscopy) you may notice spotting of blood in your stool or on the toilet paper. If you underwent a bowel prep for your procedure, you may not have a normal bowel movement for a few days.  Please Note:  You might notice some irritation and congestion in your nose or some drainage.  This is from the oxygen used during your procedure.  There is no need for concern and it should clear up in a day or so.  SYMPTOMS TO REPORT IMMEDIATELY:   Following lower endoscopy (colonoscopy or flexible sigmoidoscopy):  Excessive amounts of blood in the stool  Significant tenderness or worsening of abdominal pains  Swelling of the abdomen that is new, acute  Fever of 100F or higher   For urgent or emergent issues, a gastroenterologist can be reached at any hour by calling [phone].   DIET:  We do recommend a small meal at first, but then you may proceed to your regular diet.  Drink plenty of fluids but you should avoid alcoholic beverages for 24 hours.  ACTIVITY:  You should plan to take it easy for the rest of today and you should NOT DRIVE or use heavy machinery until tomorrow  (because of the sedation medicines used during the test).    FOLLOW UP: Our staff will call the number listed on your records the next business day following your procedure to check on you and address any questions or concerns that you may have regarding the information given to you following your procedure. If we do not reach you, we will leave a message.  However, if you are feeling well and you are not experiencing any problems, there is no need to return our call.  We will assume that you have returned to your regular daily activities without incident.  If any biopsies were taken you will be contacted by phone or by letter within the next 1-3 weeks.  Please call us at [phone] if you have not heard about the biopsies in 3 weeks.    SIGNATURES/CONFIDENTIALITY: You and/or your care partner have signed paperwork which will be entered into your electronic medical record.  These signatures attest to the fact that that the information above on your After Visit Summary has been reviewed and is understood.  Full responsibility of the confidentiality of this discharge information lies with you and/or your care-partner.

## 2017-02-09 ENCOUNTER — Telehealth: Payer: Self-pay

## 2017-02-09 NOTE — Telephone Encounter (Signed)
  Follow up Call-  Call back number 01/20/2017  Post procedure Call Back phone  # [phone]  Permission to leave phone message Yes  Some recent data might be hidden     Patient questions:  Do you have a fever, pain , or abdominal swelling? No. Pain Score  0 *  Have you tolerated food without any problems? Yes.    Have you been able to return to your normal activities? Yes.    Do you have any questions about your discharge instructions: Diet   No. Medications  No. Follow up visit  No.  Do you have questions or concerns about your Care? No.  Actions: * If pain score is 4 or above: No action needed, pain <4.

## 2017-02-15 ENCOUNTER — Encounter: Payer: Self-pay | Admitting: Nurse Practitioner

## 2017-02-15 ENCOUNTER — Encounter: Payer: Self-pay | Admitting: Gastroenterology

## 2017-02-15 ENCOUNTER — Ambulatory Visit (INDEPENDENT_AMBULATORY_CARE_PROVIDER_SITE_OTHER): Payer: Medicare Other | Admitting: Nurse Practitioner

## 2017-02-15 VITALS — BP 130/68 | HR 56 | Ht 63.0 in | Wt 202.0 lb

## 2017-02-15 DIAGNOSIS — E7849 Other hyperlipidemia: Secondary | ICD-10-CM | POA: Diagnosis not present

## 2017-02-15 LAB — BASIC METABOLIC PANEL
BUN/Creatinine Ratio: 12 (ref 12–28)
BUN: 13 mg/dL (ref 8–27)
CO2: 23 mmol/L (ref 20–29)
Calcium: 9.3 mg/dL (ref 8.7–10.3)
Chloride: 106 mmol/L (ref 96–106)
Creatinine, Ser: 1.05 mg/dL — ABNORMAL HIGH (ref 0.57–1.00)
GFR calc Af Amer: 66 mL/min/{1.73_m2} (ref >59)
GFR calc non Af Amer: 57 mL/min/{1.73_m2} — ABNORMAL LOW (ref >59)
Glucose: 96 mg/dL (ref 65–99)
Potassium: 4.4 mmol/L (ref 3.5–5.2)
Sodium: 141 mmol/L (ref 134–144)

## 2017-02-15 LAB — LIPID PANEL
Chol/HDL Ratio: 2.2 ratio (ref 0.0–4.4)
Cholesterol, Total: 104 mg/dL (ref 100–199)
HDL: 48 mg/dL (ref >39)
LDL Calculated: 38 mg/dL (ref 0–99)
Triglycerides: 88 mg/dL (ref 0–149)
VLDL Cholesterol Cal: 18 mg/dL (ref 5–40)

## 2017-02-15 LAB — HEPATIC FUNCTION PANEL
ALT: 13 IU/L (ref 0–32)
AST: 11 IU/L (ref 0–40)
Albumin: 4.1 g/dL (ref 3.6–4.8)
Alkaline Phosphatase: 80 IU/L (ref 39–117)
Bilirubin Total: 0.3 mg/dL (ref 0.0–1.2)
Bilirubin, Direct: 0.11 mg/dL (ref 0.00–0.40)
Total Protein: 6.4 g/dL (ref 6.0–8.5)

## 2017-02-15 NOTE — Progress Notes (Addendum)
CARDIOLOGY OFFICE NOTE  Date:  02/15/2017    Alexis Wheeler Date of Birth: 16-Jan-1955 Medical Record #[id number]  PCP:  Nolene Ebbs, MD  Cardiologist:  Rosanne Sack   Chief Complaint  Patient presents with  . Hypertension  . Hyperlipidemia    Follow up visit - seen for Dr. Radford Pax    History of Present Illness: Alexis Wheeler is a 62 y.o. female who presents today for a 6 month check. Seen for Dr. Radford Pax.   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 She underwent replacement of the ascending aorta with resuspension of the AV with 30 mm Hemashield Platinum graft with R axillary artery cannulation by Dr. Servando Snare. EF was 40-45% post op. Follow up echo from 07/2014 showed improvement of her EF - now at 55 to 60%.   Admitted back in December of 2016 with uncontrolled HTN. Medicines were all changed. She had stopped some. She was referred back here for management of BP. Did not have PCP. She was placed on spironolactone and changed her atenolol to carvedilol.   I then sawher back several times - BP still too high. Did not tolerate Hydralazine. Complained of what sounded like exertional fatigue - she was not cathed prior to her AAA dissection and I was worried about CAD. Myoview was obtained - turned out to be high risk - was referred for cath - this was reassuring - only with very mild CAD that is nonobstructive. When seen back in May of 2017 was having what sounded like pain from kidney stones. Ended up seeing GU.   I saw her back in December of 2017- money was a big issue. Medicines a little mixed up - on 2 beta blockers and not clear why - got that straightened out and increased the dose of the Coreg for better BP control. She was otherwise felt to be doing ok. On follow up in January she was doing ok. Trying to get a new PCP but having  difficulty with this.   Back in February of 2018 she got a CT angio when admitted with chest pain -- possible small PE versus scarring noted - she did not wish to be on blood thinner. No DVT on doppler study. She was not short of breath and clinically did not fit the picture for a PE. She has not been short of breath.   I saw her in April of 2018 - she was doing ok. BPstable. Still with these "weak spells" that the etiology is unknown. Having lots of trouble trying to get a PCP. To go on Medicare this August.   In the ER at the end of June with chest pain - negative evaluation. CTA of the chest noted. No PE on that study noted but did have abnormality of the renal artery - I asked EBG to review. I then saw her following this last ER visit - she was doing better. Did have a PCP, had gotten on Medicare. Last visit with me was back in August - still having these "undefined" weak spells but otherwise felt to be doing ok - PCP advised better eating/exercise and good habits.   Comes back today. Here alone. She notes that she is doing ok but continues to have these "weak spells". She is convinced that this is from her heart. She has  profound fatigue with exertion. She is getting more limited in her ability to "keep up" and do activities of daily living. She does not feel that there is a correlation with her diet. She does not know if it is related to her BP. She does not endorse palpitations. She says she is "just going to have to accept" her limitations. No chest pain. BP doing ok. Tolerating her medicines pretty well. Notes she is to see EBG soon.   Past Medical History:  Diagnosis Date  . Anemia    childhood  . Arthritis    right knee  . Blood transfusion without reported diagnosis    with heart surgery-repair aorta  . CAD (coronary artery disease)    a. 04/2015: cath showed 30% Ost RCA stenosis, 20% mid-LAD stenosis, and normal LV function.   . Cataract    bilateral eyes, per pt.  .  Congestive dilated cardiomyopathy ([place]) 08/01/2014  . GERD   . HEMATURIA UNSPECIFIED   . History of kidney stones   . HYPERLIPIDEMIA   . HYPERTENSION   . Morbid obesity ([place])   . Myocardial infarction ([place])    01/30/2014  . MYOSITIS   . Sleep apnea    cpap-  doesnt use it- last study years ago  . Vertigo     Past Surgical History:  Procedure Laterality Date  . ABDOMINAL HYSTERECTOMY  1983  . CARDIAC CATHETERIZATION N/A 04/30/2015   Procedure: Left Heart Cath and Coronary Angiography;  Surgeon: Peter M Martinique, MD;  Location: [facility];  Service: Cardiovascular;  Laterality: N/A;  . CHOLECYSTECTOMY  2001  . CYSTOSCOPY W/ RETROGRADES Left 08/02/2015   Procedure: CYSTOSCOPY WITH LEFT RETROGRADE PYELOGRAM;  Surgeon: Festus Aloe, MD;  Location: [facility];  Service: Urology;  Laterality: Left;  . CYSTOSCOPY WITH URETEROSCOPY, STONE BASKETRY AND STENT PLACEMENT Left 08/02/2015   Procedure:  Mitzi Davenport ;  Surgeon: Festus Aloe, MD;  Location: [facility];  Service: Urology;  Laterality: Left;  . CYSTOSCOPY/URETEROSCOPY/HOLMIUM LASER/STENT PLACEMENT Left 08/02/2015   Procedure: CYSTOSCOPY/URETEROSCOPY/HOLMIUM LASER/STENT PLACEMENT-LEFT;  Surgeon: Festus Aloe, MD;  Location: [facility];  Service: Urology;  Laterality: Left;  . HOLMIUM LASER APPLICATION Left 0/27/2536   Procedure: HOLMIUM LASER APPLICATION;  Surgeon: Festus Aloe, MD;  Location: [facility];  Service: Urology;  Laterality: Left;  . REPLACEMENT ASCENDING AORTA N/A 03/03/2014   Procedure: REPAIR OF ASCENDING AORTIC DISSECTION;  Surgeon: Grace Isaac, MD;  Location: [facility];  Service: Open Heart Surgery;  Laterality: N/A;     Medications: Current Meds  Medication Sig  . amLODipine (NORVASC) 10 MG tablet Take 1 tablet (10 mg total) by mouth daily.  [name] aspirin EC 81 MG EC tablet Take 1 tablet (81 mg total) by mouth daily.  [name] atorvastatin (LIPITOR) 40 MG tablet Take 1 tablet (40 mg total) by mouth daily at 6 PM.  .  carvedilol (COREG) 12.5 MG tablet Take 1 tablet (12.5 mg total) by mouth 2 (two) times daily.  . Cholecalciferol 1000 units tablet Take 1,000 Units by mouth daily.  . colestipol (COLESTID) 1 g tablet Take 1 tablet (1 g total) by mouth 2 (two) times daily.  [name] dicyclomine (BENTYL) 20 MG tablet Take 1 tablet (20 mg total) by mouth every 8 (eight) hours.  . folic acid (FOLVITE) 1 MG tablet Take 1 mg by mouth daily.  [name] lisinopril (PRINIVIL,ZESTRIL) 40 MG tablet Take 1 tablet (40 mg total) by mouth daily.  [name] spironolactone (ALDACTONE) 50 MG tablet Take 1  tablet (50 mg total) by mouth daily.  . vitamin E 400 UNIT capsule Take 400 Units by mouth daily.     Allergies: Allergies  Allergen Reactions  . Hydralazine Nausea Only  . Motrin [Ibuprofen] Nausea Only    Upset GI (motrin brand causes the side effect)    Social History: The patient  reports that  has never smoked. she has never used smokeless tobacco. She reports that she does not drink alcohol or use drugs.   Family History: The patient's family history includes Aortic dissection (age of onset: 30) in her mother; Breast cancer in her sister; Breast cancer (age of onset: 53) in her mother; Colon cancer in her brother; Esophageal cancer in her brother; Hypertension in her daughter, father, mother, and sister; Throat cancer in her brother.   Review of Systems: Please see the history of present illness.   Otherwise, the review of systems is positive for none.   All other systems are reviewed and negative.   Physical Exam: VS:  BP 130/68 (BP Location: Left Arm, Patient Position: Sitting, Cuff Size: Large)   Pulse (!) 56   Ht 5\' 3"  (1.6 m)   Wt 202 lb (91.6 kg)   SpO2 98% Comment: at rest  BMI 35.78 kg/m  .  BMI Body mass index is 35.78 kg/m.  Wt Readings from Last 3 Encounters:  02/15/17 202 lb (91.6 kg)  01/31/17 209 lb (94.8 kg)  01/26/17 203 lb (92.1 kg)    General: Pleasant. Well developed, well nourished and in no acute  distress.   HEENT: Normal.  Neck: Supple, no JVD, carotid bruits, or masses noted.  Cardiac: Regular rate and rhythm. No murmurs, rubs, or gallops. No edema.  Respiratory:  Lungs are clear to auscultation bilaterally with normal work of breathing.  GI: Soft and nontender.  MS: No deformity or atrophy. Gait and ROM intact.  Skin: Warm and dry. Color is normal.  Neuro:  Strength and sensation are intact and no gross focal deficits noted.  Psych: Alert, appropriate and with normal affect.   LABORATORY DATA:  EKG:  EKG is not ordered today.  Lab Results  Component Value Date   WBC 6.9 08/31/2016   HGB 13.1 08/31/2016   HCT 39.5 08/31/2016   PLT 236 08/31/2016   GLUCOSE 92 08/31/2016   CHOL 125 08/31/2016   TRIG 83 08/31/2016   HDL 54 08/31/2016   LDLCALC 54 08/31/2016   ALT 11 08/31/2016   AST 13 08/31/2016   NA 140 08/31/2016   K 4.4 08/31/2016   CL 103 08/31/2016   CREATININE 1.01 (H) 08/31/2016   BUN 11 08/31/2016   CO2 23 08/31/2016   TSH 0.968 08/31/2016   INR 0.94 04/26/2015   HGBA1C 6.0 (H) 08/31/2016     BNP (last 3 results) Recent Labs    03/08/16 0405  BNP 16.9    ProBNP (last 3 results) No results for input(s): PROBNP in the last 8760 hours.   Other Studies Reviewed Today:  CTA CHEST IMPRESSION 06/2016: Stable aortic dissection involving the aortic arch, descending thoracic aorta and abdominal aorta. The dissection begins just beyond the tube graft repair site in the ascending thoracic aorta and involves the brachiocephalic artery which remains patent. Dissection is unchanged.  Mesenteric vessels are patent, arising from the true lumen. 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 Decreased profusion to the mid and upper pole of the  left kidney. This is a stable finding when compared to study from 03/08/2016.  No significant change since prior study.  No  acute findings in the chest, abdomen or pelvis.   Electronically Signed By: Rolm Baptise M.D. On: 07/09/2016 11:22      Cardiac Cath Procedures 04/2015   Left Heart Cath and Coronary Angiography    Conclusion    Ost RCA to Prox RCA lesion, 30% stenosed.  Mid LAD lesion, 20% stenosed.  The left ventricular systolic function is normal.  1. Mild nonobstructive CAD 2. Normal LV function.  Plan: medical management.       Myoview Study Highlights4/2017    The left ventricular ejection fraction is moderately decreased (30-44%).  Nuclear stress EF: 42%.  Defect 1: There is a large defect of moderate severity.  Findings consistent with ischemia.  No T wave inversion was noted during stress.  There was no ST segment deviation noted during stress.  Large size, moderate severity reversible (SDS 9) inferolateral perfusion defect suggestive of ischemia. LVEF 42% with inferolateral hypokinesis. This is a high risk study based on the extent of ischemia.    Echo Study Conclusions from 07/2014  - Left ventricle: The cavity size was normal. There was mild focal  basal hypertrophy of the septum. Systolic function was normal.  The estimated ejection fraction was in the range of 55% to 60%.  Wall motion was normal; there were no regional wall motion  abnormalities. There was an increased relative contribution of  atrial contraction to ventricular filling. Doppler parameters are  consistent with abnormal left ventricular relaxation (grade 1  diastolic dysfunction). - Aortic valve: Trileaflet; normal thickness, mildly calcified  leaflets. There was trivial regurgitation. - Aorta: Aortic root dimension: 42 mm (ED). - Aortic root: The aortic root was mildly dilated. - Mitral valve: There was trivial regurgitation.  Assessment/Plan:  1. History of aortic dissection - she is doing ok. To see EBG in the next few weeks.   2. "Weak spells" -  unclear etiology - she is not wanting to wear a monitor just quite yet - she is agreeable to checking her BP pretty diligently over the next 30 days and keeping a diary of how she feels. I have completed a form for her in regards to her housing and to try and get her some supportive care. She is limited by these symptoms and is not able to engage in heavy/strenuous activities.   3. HTN - BP ok on current regimen.   4. HLD - on statin - lab today  5. Prior chest pain - she had nonobstructive disease by cath from 04/2015 - risk factor modification recommended.   6. Iron deficiency anemia - back on Folic acid. Most recent HGB was stable.  7. Prior dilated CM - last echo showed EF had recovered but was lower by recent Myoview - yet normal at time of cardiac cath - she looks good clinically and I have left her on her current regimen.          Current medicines are reviewed with the patient today.  The patient does not have concerns regarding medicines other than what has been noted above.  The following changes have been made:  See above.  Labs/ tests ordered today include:    Orders Placed This Encounter  Procedures  . Basic metabolic panel  . Lipid panel  . Hepatic function panel     Disposition:   FU with me in 1 months.   Patient  is agreeable to this plan and will call if any problems develop in the interim.   SignedTruitt Merle, NP  02/15/2017 9:21 AM  [place] [address] [place], [place]  [address] Phone: [phone] Fax: [phone]

## 2017-02-15 NOTE — Patient Instructions (Addendum)
We will be checking the following labs today - BMET, HPF and Lipids  Medication Instructions:    Continue with your current medicines.     Testing/Procedures To Be Arranged:  N/A  Follow-Up:   See me in about a month    Other Special Instructions:   I want you to keep a detailed log of your BP and how you are feeling over the next 30 days.     If you need a refill on your cardiac medications before your next appointment, please call your pharmacy.   Call the [place] office at [phone] if you have any questions, problems or concerns.

## 2017-02-17 ENCOUNTER — Telehealth: Payer: Self-pay | Admitting: *Deleted

## 2017-02-17 DIAGNOSIS — G4733 Obstructive sleep apnea (adult) (pediatric): Secondary | ICD-10-CM

## 2017-02-17 NOTE — Telephone Encounter (Signed)
Informed patient of titration results and verbalized understanding was indicated. Patient understands she had a successful PAP titration and Dr Radford Pax has ordered her a CPAP. Patient understands she will be contacted by [facility] to set up her cpap. She understands to call if [facility] does not contact her with new setup in a timely manner. She understands she will be called once confirmation has been received from [facility] that she has received her new machine to schedule 10 week follow up appointment.  [facility] notified of new cpap order  Please add to airview She was grateful for the call and thanked me.

## 2017-02-25 NOTE — Telephone Encounter (Signed)
Patient has a 10 week follow up appointment scheduled for May 10 2017. 2018. Patient understands she needs to keep this appointment for insurance compliance. Patient was grateful for the call and thanked me.

## 2017-03-01 ENCOUNTER — Other Ambulatory Visit: Payer: Self-pay | Admitting: Cardiothoracic Surgery

## 2017-03-01 DIAGNOSIS — I7103 Dissection of thoracoabdominal aorta: Secondary | ICD-10-CM

## 2017-03-04 ENCOUNTER — Other Ambulatory Visit: Payer: Self-pay | Admitting: Cardiothoracic Surgery

## 2017-03-04 DIAGNOSIS — I7103 Dissection of thoracoabdominal aorta: Secondary | ICD-10-CM

## 2017-03-05 ENCOUNTER — Telehealth: Payer: Self-pay | Admitting: Nurse Practitioner

## 2017-03-05 NOTE — Telephone Encounter (Signed)
Patient calling,  States that she had a persistent cough this whole week. Patient states that she has a new patient  appointment with PCP on Tuesday and did not want to go the whole weekend with cough.   Patient would like to know if she can have a prescription called in, since this office is familiar with her medical history.

## 2017-03-05 NOTE — Telephone Encounter (Signed)
close

## 2017-03-05 NOTE — Telephone Encounter (Signed)
Called patient and informed her that Truitt Merle NP is not in the office today. Informed patient she might need to go to urgent care if she feels like she needs to see someone for a cough, or have something prescribed for a cough. Patient verbalized understanding.

## 2017-03-30 ENCOUNTER — Ambulatory Visit: Payer: Medicare Other | Admitting: Nurse Practitioner

## 2017-03-30 DIAGNOSIS — R0989 Other specified symptoms and signs involving the circulatory and respiratory systems: Secondary | ICD-10-CM

## 2017-03-30 NOTE — Progress Notes (Deleted)
CARDIOLOGY OFFICE NOTE  Date:  03/30/2017    Alexis Wheeler Date of Birth: Jun 19, 1955 Medical Record #[id number]  PCP:  Nolene Ebbs, MD  Cardiologist:  Servando Snare & ***    No chief complaint on file.   History of Present Illness: Alexis Wheeler is a 62 y.o. female who presents today for a ***  Seen for Dr. Radford Pax.   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 She underwent replacement of the ascending aorta with resuspension of the AV with 30 mm Hemashield Platinum graft with R axillary artery cannulation by Dr. Servando Snare. EF was 40-45% post op. Follow up echo from 07/2014 showed improvement of her EF - now at 55 to 60%.   Admitted back in December of 2016 with uncontrolled HTN. Medicines were all changed. She had stopped some. She was referred back here for management of BP. Did not have PCP. She was placed on spironolactone and changed her atenolol to carvedilol.   I then sawher back several times - BP still too high. Did not tolerate Hydralazine. Complained of what sounded like exertional fatigue - she was not cathed prior to her AAA dissection and I was worried about CAD. Myoview was obtained - turned out to be high risk - was referred for cath - this was reassuring - only with very mild CAD that is nonobstructive. When seen back in May of 2017 was having what sounded like pain from kidney stones. Ended up seeing GU.   I saw her back in December of 2017- money was a big issue. Medicines a little mixed up - on 2 beta blockers and not clear why - got that straightened out and increased the dose of the Coreg for better BP control. She was otherwise felt to be doing ok. On follow up in January she was doing ok. Trying to get a new PCP but having difficulty with this.   Back in February of 2018 she got a CT angio when admitted with chest pain --  possible small PE versus scarring noted - she did not wish to be on blood thinner. No DVT on doppler study. She was not short of breath and clinically did not fit the picture for a PE. She has not been short of breath.   I saw her in April of 2018 - she was doing ok. BPstable. Still with these "weak spells" that the etiology is unknown. Having lots of trouble trying to get a PCP. To go on Medicare this August.   In the ERat the end of June withchest pain - negative evaluation. CTA of the chest noted. No PE on thatstudy noted but did have abnormality of the renal artery - I asked EBG to review.I then saw her following this last ER visit - she was doing better. Did have a PCP, had gotten on Medicare.Last visit with me was back in August - still having these "undefined" weak spells but otherwise felt to be doing ok - PCP advised better eating/exercise and good habits.   Comes back today. Here alone.She notes that she is doing ok but continues to have these "weak spells". She is convinced that this is from her heart. She has profound fatigue with exertion. She is getting more limited in her ability to "keep up" and do activities of daily living. She does not feel  that there is a correlation with her diet. She does not know if it is related to her BP. She does not endorse palpitations. She says she is "just going to have to accept" her limitations. No chest pain. BP doing ok. Tolerating her medicines pretty well. Notes she is to see EBG soon.  Comes in today. Here with   Past Medical History:  Diagnosis Date  . Anemia    childhood  . Arthritis    right knee  . Blood transfusion without reported diagnosis    with heart surgery-repair aorta  . CAD (coronary artery disease)    a. 04/2015: cath showed 30% Ost RCA stenosis, 20% mid-LAD stenosis, and normal LV function.   . Cataract    bilateral eyes, per pt.  . Congestive dilated cardiomyopathy ([place]) 08/01/2014  . GERD   . HEMATURIA UNSPECIFIED    . History of kidney stones   . HYPERLIPIDEMIA   . HYPERTENSION   . Morbid obesity ([place])   . Myocardial infarction ([place])    01/30/2014  . MYOSITIS   . Sleep apnea    cpap-  doesnt use it- last study years ago  . Vertigo     Past Surgical History:  Procedure Laterality Date  . ABDOMINAL HYSTERECTOMY  1983  . CARDIAC CATHETERIZATION N/A 04/30/2015   Procedure: Left Heart Cath and Coronary Angiography;  Surgeon: Peter M Martinique, MD;  Location: [facility];  Service: Cardiovascular;  Laterality: N/A;  . CHOLECYSTECTOMY  2001  . CYSTOSCOPY W/ RETROGRADES Left 08/02/2015   Procedure: CYSTOSCOPY WITH LEFT RETROGRADE PYELOGRAM;  Surgeon: Festus Aloe, MD;  Location: [facility];  Service: Urology;  Laterality: Left;  . CYSTOSCOPY WITH URETEROSCOPY, STONE BASKETRY AND STENT PLACEMENT Left 08/02/2015   Procedure:  Mitzi Davenport ;  Surgeon: Festus Aloe, MD;  Location: [facility];  Service: Urology;  Laterality: Left;  . CYSTOSCOPY/URETEROSCOPY/HOLMIUM LASER/STENT PLACEMENT Left 08/02/2015   Procedure: CYSTOSCOPY/URETEROSCOPY/HOLMIUM LASER/STENT PLACEMENT-LEFT;  Surgeon: Festus Aloe, MD;  Location: [facility];  Service: Urology;  Laterality: Left;  . HOLMIUM LASER APPLICATION Left 0/86/7619   Procedure: HOLMIUM LASER APPLICATION;  Surgeon: Festus Aloe, MD;  Location: [facility];  Service: Urology;  Laterality: Left;  . REPLACEMENT ASCENDING AORTA N/A 03/03/2014   Procedure: REPAIR OF ASCENDING AORTIC DISSECTION;  Surgeon: Grace Isaac, MD;  Location: [facility];  Service: Open Heart Surgery;  Laterality: N/A;     Medications: No outpatient medications have been marked as taking for the 03/30/17 encounter (Appointment) with Burtis Junes, NP.     Allergies: Allergies  Allergen Reactions  . Hydralazine Nausea Only  . Motrin [Ibuprofen] Nausea Only    Upset GI (motrin brand causes the side effect)    Social History: The patient  reports that  has never smoked. she has never used  smokeless tobacco. She reports that she does not drink alcohol or use drugs.   Family History: The patient's ***family history includes Aortic dissection (age of onset: 74) in her mother; Breast cancer in her sister; Breast cancer (age of onset: 65) in her mother; Colon cancer in her brother; Esophageal cancer in her brother; Hypertension in her daughter, father, mother, and sister; Throat cancer in her brother.   Review of Systems: Please see the history of present illness.   Otherwise, the review of systems is positive for {NONE DEFAULTED:18576::"none"}.   All other systems are reviewed and negative.   Physical Exam: VS:  There were no vitals taken for this visit. [name]  BMI There is no height or weight on file to calculate BMI.  Wt Readings from Last 3 Encounters:  02/15/17 202 lb (91.6 kg)  01/31/17 209 lb (94.8 kg)  01/26/17 203 lb (92.1 kg)    General: Pleasant. Well developed, well nourished and in no acute distress.   HEENT: Normal.  Neck: Supple, no JVD, carotid bruits, or masses noted.  Cardiac: ***Regular rate and rhythm. No murmurs, rubs, or gallops. No edema.  Respiratory:  Lungs are clear to auscultation bilaterally with normal work of breathing.  GI: Soft and nontender.  MS: No deformity or atrophy. Gait and ROM intact.  Skin: Warm and dry. Color is normal.  Neuro:  Strength and sensation are intact and no gross focal deficits noted.  Psych: Alert, appropriate and with normal affect.   LABORATORY DATA:  EKG:  EKG {ACTION; IS/IS IWP:[id number]} ordered today. This demonstrates ***.  Lab Results  Component Value Date   WBC 6.9 08/31/2016   HGB 13.1 08/31/2016   HCT 39.5 08/31/2016   PLT 236 08/31/2016   GLUCOSE 96 02/15/2017   CHOL 104 02/15/2017   TRIG 88 02/15/2017   HDL 48 02/15/2017   LDLCALC 38 02/15/2017   ALT 13 02/15/2017   AST 11 02/15/2017   NA 141 02/15/2017   K 4.4 02/15/2017   CL 106 02/15/2017   CREATININE 1.05 (H) 02/15/2017   BUN 13 02/15/2017     CO2 23 02/15/2017   TSH 0.968 08/31/2016   INR 0.94 04/26/2015   HGBA1C 6.0 (H) 08/31/2016     BNP (last 3 results) No results for input(s): BNP in the last 8760 hours.  ProBNP (last 3 results) No results for input(s): PROBNP in the last 8760 hours.   Other Studies Reviewed Today:  CTA CHEST IMPRESSION 06/2016: Stable aortic dissection involving the aortic arch, descending thoracic aorta and abdominal aorta. The dissection begins just beyond the tube graft repair site in the ascending thoracic aorta and involves the brachiocephalic artery which remains patent. Dissection is unchanged.  Mesenteric vessels are patent, arising from the true lumen. 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 Decreased profusion to the mid and upper pole of the left kidney. This is a stable finding when compared to study from 03/08/2016.  No significant change since prior study.  No acute findings in the chest, abdomen or pelvis.   Electronically Signed By: Rolm Baptise M.D. On: 07/09/2016 11:22      Cardiac Cath Procedures 04/2015   Left Heart Cath and Coronary Angiography    Conclusion    Ost RCA to Prox RCA lesion, 30% stenosed.  Mid LAD lesion, 20% stenosed.  The left ventricular systolic function is normal.  1. Mild nonobstructive CAD 2. Normal LV function.  Plan: medical management.       Myoview Study Highlights4/2017    The left ventricular ejection fraction is moderately decreased (30-44%).  Nuclear stress EF: 42%.  Defect 1: There is a large defect of moderate severity.  Findings consistent with ischemia.  No T wave inversion was noted during stress.  There was no ST segment deviation noted during stress.  Large size, moderate severity reversible (SDS 9) inferolateral perfusion defect suggestive of ischemia. LVEF 42% with inferolateral  hypokinesis. This is a high risk study based on the extent of ischemia.    Echo Study Conclusions from 07/2014  - Left ventricle: The cavity size was normal. There was  mild focal  basal hypertrophy of the septum. Systolic function was normal.  The estimated ejection fraction was in the range of 55% to 60%.  Wall motion was normal; there were no regional wall motion  abnormalities. There was an increased relative contribution of  atrial contraction to ventricular filling. Doppler parameters are  consistent with abnormal left ventricular relaxation (grade 1  diastolic dysfunction). - Aortic valve: Trileaflet; normal thickness, mildly calcified  leaflets. There was trivial regurgitation. - Aorta: Aortic root dimension: 42 mm (ED). - Aortic root: The aortic root was mildly dilated. - Mitral valve: There was trivial regurgitation.  Assessment/Plan:  1.History of aortic dissection - she is doing ok. To see EBG in the next few weeks.   2. "Weak spells" - unclear etiology - she is not wanting to wear a monitor just quite yet - she is agreeable to checking her BP pretty diligently over the next 30 days and keeping a diary of how she feels. I have completed a form for her in regards to her housing and to try and get her some supportive care. She is limited by these symptoms and is not able to engage in heavy/strenuous activities.   3. HTN - BP ok on current regimen.   4. HLD - on statin - lab today  5. Prior chest pain - she had nonobstructive disease by cath from 04/2015 - risk factor modification recommended.   6. Iron deficiency anemia - back on Folic acid. Most recent HGB was stable.  7. Prior dilated CM - last echo showed EF had recovered but was lower by recent Myoview - yet normal at time of cardiac cath - she looks good clinically and I have left her on her current regimen.          Current medicines are reviewed with the patient today.  The patient does not  have concerns regarding medicines other than what has been noted above.  The following changes have been made:  See above.  Labs/ tests ordered today include:   No orders of the defined types were placed in this encounter.    Disposition:   FU with *** in {gen number 4-58:099833} {Days to years:10300}.   Patient is agreeable to this plan and will call if any problems develop in the interim.   SignedTruitt Merle, NP  03/30/2017 7:41 AM  [place] [address] [place], [place]  [address] Phone: [phone] Fax: [phone]

## 2017-03-31 ENCOUNTER — Encounter: Payer: Self-pay | Admitting: Nurse Practitioner

## 2017-04-08 ENCOUNTER — Ambulatory Visit (INDEPENDENT_AMBULATORY_CARE_PROVIDER_SITE_OTHER): Payer: Medicare Other | Admitting: Cardiothoracic Surgery

## 2017-04-08 ENCOUNTER — Encounter: Payer: Self-pay | Admitting: Cardiothoracic Surgery

## 2017-04-08 ENCOUNTER — Other Ambulatory Visit: Payer: Self-pay

## 2017-04-08 ENCOUNTER — Ambulatory Visit
Admission: RE | Admit: 2017-04-08 | Discharge: 2017-04-08 | Disposition: A | Discharge status: Home / Self Care | Payer: Medicare Other | Source: Ambulatory Visit | Attending: Cardiothoracic Surgery | Admitting: Cardiothoracic Surgery

## 2017-04-08 VITALS — BP 158/78 | HR 56 | Resp 18 | Ht 63.0 in | Wt 202.8 lb

## 2017-04-08 DIAGNOSIS — I7103 Dissection of thoracoabdominal aorta: Secondary | ICD-10-CM

## 2017-04-08 MED ORDER — IOPAMIDOL (ISOVUE-370) INJECTION 76%
75.0000 mL | Freq: Once | INTRAVENOUS | Status: AC | PRN
  Administered 2017-04-08: 75 mL via INTRAVENOUS

## 2017-04-08 NOTE — Patient Instructions (Signed)

## 2017-04-08 NOTE — Progress Notes (Signed)
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 SURGICAL PROCEDURES: 1. Replacement of ascending aorta. 2. Resuspension of aortic valve  3. Right axillary artery cannulation and hypothermic circulatory circulatory arrest. SURGEON: Lanelle Bal, MD.  History of Present Illness:   Patient returns to the office today in follow-up after resuspension of her aortic valve and ascending aorta for type I aortic dissection done March 02, 2014.  Early postoperative she had significant problems with blood pressure control.  This seems to be improved on her current medications.  She returns today with a yearly CT scan.  She has not had an echocardiogram since July 2016.  Her major complaint now is fatigue , she is enrolled in the [employer] exercise program .  She denies any specific shortness of breath or chest discomfort but does fatigue with increased activity .    Past Medical History:  Diagnosis Date  . Anemia    childhood  . Arthritis    right knee  . Blood transfusion without reported diagnosis    with heart surgery-repair aorta  . CAD (coronary artery disease)    a. 04/2015: cath showed 30% Ost RCA stenosis, 20% mid-LAD stenosis, and normal LV function.   . Cataract    bilateral eyes, per pt.  . Congestive dilated cardiomyopathy ([facility]) 08/01/2014  . GERD   . HEMATURIA UNSPECIFIED   . History of kidney stones   . HYPERLIPIDEMIA   . HYPERTENSION   . Morbid obesity ([facility])   . Myocardial infarction ([facility])    01/30/2014  .  MYOSITIS   . Sleep apnea    cpap-  doesnt use it- last study years ago  . 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 Hydralazine Nausea Only  . Motrin [Ibuprofen] Nausea Only    Upset GI (motrin brand causes the side effect)    Current Outpatient Medications  Medication Sig Dispense Refill  . amLODipine (NORVASC) 10 MG tablet Take 1 tablet (10 mg total) by mouth daily. 90 tablet 3  . aspirin EC 81 MG EC tablet Take 1 tablet (81 mg total) by mouth daily.    [name] atorvastatin (LIPITOR) 40 MG tablet Take 1 tablet (40 mg total) by mouth daily at 6 PM. 90 tablet 3  . carvedilol (COREG) 12.5 MG tablet Take 1 tablet (12.5 mg total) by mouth 2 (two) times daily. 180 tablet 3  . Cholecalciferol 1000 units tablet Take 1,000 Units by mouth daily.    . colestipol (COLESTID) 1 g tablet Take 1 tablet (1 g total) by mouth 2 (two) times daily. 60 tablet 3  . dicyclomine (BENTYL) 20 MG tablet Take 1 tablet (20 mg total)  by mouth every 8 (eight) hours. 60 tablet 2  . folic acid (FOLVITE) 1 MG tablet Take 1 mg by mouth daily.    [name] lisinopril (PRINIVIL,ZESTRIL) 40 MG tablet Take 1 tablet (40 mg total) by mouth daily. 90 tablet 3  . spironolactone (ALDACTONE) 50 MG tablet Take 1 tablet (50 mg total) by mouth daily. 90 tablet 3  . vitamin E 400 UNIT capsule Take 400 Units by mouth daily.     No current facility-administered medications for this visit.    Review of Systems  Constitutional: Negative.   HENT: Negative.   Eyes: Negative.   Respiratory: Positive for shortness of breath. Negative for cough, hemoptysis, sputum production and wheezing.   Cardiovascular: Negative for chest pain, palpitations, orthopnea,  claudication, leg swelling and PND.  Gastrointestinal: Negative.   Genitourinary: Negative.   Musculoskeletal: Positive for joint pain.  Skin: Negative.   Neurological: Negative.   Endo/Heme/Allergies: Negative.   Psychiatric/Behavioral: Negative.        Physical Exam: BP (!) 158/78 (BP Location: Right Arm, Patient Position: Sitting, Cuff Size: Large)   Pulse (!) 56   Resp 18   Ht 5\' 3"  (1.6 m)   Wt 202 lb 12.8 oz (92 kg)   SpO2 97% Comment: RA  BMI 35.92 kg/m  General appearance: alert, cooperative and no distress Head: Normocephalic, without obvious abnormality, atraumatic Neck: no adenopathy, no carotid bruit, no JVD, supple, symmetrical, trachea midline and thyroid not enlarged, symmetric, no tenderness/mass/nodules Lymph nodes: Cervical, supraclavicular, and axillary nodes normal. Resp: clear to auscultation bilaterally Back: symmetric, no curvature. ROM normal. No CVA tenderness. Cardio: regular rate and rhythm, S1, S2 normal, no murmur, click, rub or gallop GI: soft, non-tender; bowel sounds normal; no masses,  no organomegaly Extremities: extremities normal, atraumatic, no cyanosis or edema and Homans sign is negative, no sign of DVT Neurologic: Grossly normal Sternum is stable and well-healed she has some keloid formation along the incision.  Diagnostic Studies & Laboratory data:     Recent Radiology Findings:  Ct Angio Chest Aorta W/cm &/or Wo/cm  Addendum Date: 04/08/2017   ADDENDUM REPORT: 04/08/2017 11:29 ADDENDUM: Study was compared from a CTA on 07/09/2016. The size of the descending thoracic aorta has not significantly changed since 07/09/2016. Atrophy in the left kidney upper pole is also similar to the exam on 07/09/2016. Electronically Signed   By: Markus Daft M.D.   On: 04/08/2017 11:29   Result Date: 04/08/2017 CLINICAL DATA:  62 year old with history of a type 1 aortic dissection and repair of the ascending thoracic aorta. Follow-up aortic dissection.  EXAM: CT ANGIOGRAPHY CHEST, ABDOMEN AND PELVIS TECHNIQUE: Multidetector CT imaging through the chest, abdomen and pelvis was performed using the standard protocol during bolus administration of intravenous contrast. Multiplanar reconstructed images and MIPs were obtained and reviewed to evaluate the vascular anatomy. CONTRAST:  22mL ISOVUE-370 IOPAMIDOL (ISOVUE-370) INJECTION 76% COMPARISON:  02/21/2015 FINDINGS: CTA CHEST FINDINGS Cardiovascular: Replacement of the ascending thoracic aorta. The aortic graft is patent. There is dilatation of the native aortic root measuring roughly 4.2 cm at the sinuses of Valsalva and minimally changed from the previous examination. Again noted is aortic dissection just beyond the ascending aortic graft. Dissection extends into the proximal right brachiocephalic artery and terminates proximal to the right subclavian artery. Great vessels are patent. Typical arch configuration. The left common carotid artery and left subclavian artery appear to be arising from the true lumen. True lumen is along the lateral aspect of the descending thoracic  aorta. Dissection extends throughout the descending thoracic aorta into the abdominal aorta. Distal aortic arch measures roughly 3.3 cm in diameter and stable. Proximal descending thoracic aorta measures 3.8 cm and previously measured 3.6 cm. Mid descending thoracic aorta measures 3.1 cm previously measured 3.0 cm. The aorta at the hiatus measures 3.2 cm and previously measured 3.0 cm. Overall, minimal enlargement of the descending thoracic aorta. Contrast was injected through the left arm. There is filling of multiple small paraspinal veins around the upper thoracic spine. SVC and central veins are patent. Mediastinum/Nodes: Thyroid tissue is mildly heterogeneous. No lymph node enlargement in the mediastinum or hila. No axillary lymph node enlargement. Lungs/Pleura: Trachea and mainstem bronchi are patent. No large pleural effusions. Lungs are  clear without airspace disease or consolidation. Musculoskeletal: Median sternotomy wires. No acute bone abnormality. Review of the MIP images confirms the above findings. CTA ABDOMEN AND PELVIS FINDINGS VASCULAR Aorta: Aortic dissection extends throughout the abdominal aorta and terminates just above the bifurcation. Extent of the dissection is unchanged. Aorta at the level of the celiac trunk measures 3.3 cm and previously measured 2.9 cm. Infrarenal abdominal aorta measures 2.2 cm and previously measured 2.1 cm. Incomplete contrast opacification of the false lumen. Celiac: Celiac trunk originates from the true lumen. Celiac trunk is widely patent without significant atherosclerotic disease or stenosis. SMA: SMA region originates from the true lumen. SMA is patent without significant atherosclerotic disease or stenosis. Renals: Right renal artery originates from the true lumen without significant stenosis or plaque. The main left renal artery originates from the false lumen and there is very little contrast in the main left renal artery but this could be related to the timing of the study. Accessory left renal artery originate near the dissection flap and there may be flow from the true lumen. IMA: IMA originates from the true lumen. IMA is patent without significant stenosis or plaque. Inflow: Dissection does not extend into the iliac arteries. The iliac arteries are patent bilaterally without stenosis or dilatation. Mild atherosclerotic disease in the common iliac arteries. Proximal femoral arteries are patent bilaterally. Veins: No obvious venous abnormality within the limitations of this arterial phase study. Review of the MIP images confirms the above findings. NON-VASCULAR Hepatobiliary: Gallbladder appears to be surgically absent. No gross abnormality to the liver on this arterial phase of imaging. Pancreas: Normal appearance of the pancreas without inflammation or duct dilatation. Spleen: Normal appearance  of spleen without enlargement. Adrenals/Urinary Tract: Stable low-density nodule in left adrenal gland that measures 10 Hounsfield units on the noncontrast images. Nodule measures roughly 2.3 cm and most compatible with a benign adenoma. Right adrenal gland is normal. Normal perfusion to the right kidney without hydronephrosis. No suspicious renal lesions. Delayed filling of the left kidney upper pole related to the flow from the false lumen. In addition, there has been interval atrophy of the left upper pole since the prior examination. Previously, there was a large stone in the left renal pelvis which is no longer present. Negative for left hydronephrosis. Urinary bladder is unremarkable. Stomach/Bowel: Evidence for small hiatal hernia. Normal appearance of the small bowel without obstruction. Few colonic diverticula without acute inflammatory changes. Lymphatic: No lymph node enlargement in the abdomen or pelvis. Reproductive: Uterus has been removed. Evidence for normal ovarian tissue on sequence 22, image 226 and 234. This ovarian tissue appears stable. Other: Trace fluid in the dependent aspect of the pelvis is similar to the previous examination. No free air. Musculoskeletal: No acute bone abnormality.  Review of the MIP images confirms the above findings. IMPRESSION: Stable appearance of the surgically repaired ascending thoracic aorta. Stable extent and configuration of the aortic dissection extending from the thoracic aortic graft to the distal abdominal aorta. Mild enlargement of the descending thoracic aorta and abdominal aorta. Stable enlargement of the native aortic root measuring roughly 4.2 cm. Recommend attention on follow up imaging. Increased atrophy of the left kidney upper pole secondary to flow from the false lumen. No acute abnormality in the chest, abdomen or pelvis. Electronically Signed: By: Markus Daft M.D. On: 04/08/2017 10:44   Ct Angio Abd/pel W/ And/or W/o  Addendum Date: 04/08/2017     ADDENDUM REPORT: 04/08/2017 11:29 ADDENDUM: Study was compared from a CTA on 07/09/2016. The size of the descending thoracic aorta has not significantly changed since 07/09/2016. Atrophy in the left kidney upper pole is also similar to the exam on 07/09/2016. Electronically Signed   By: Markus Daft M.D.   On: 04/08/2017 11:29   Result Date: 04/08/2017 CLINICAL DATA:  62 year old with history of a type 1 aortic dissection and repair of the ascending thoracic aorta. Follow-up aortic dissection. EXAM: CT ANGIOGRAPHY CHEST, ABDOMEN AND PELVIS TECHNIQUE: Multidetector CT imaging through the chest, abdomen and pelvis was performed using the standard protocol during bolus administration of intravenous contrast. Multiplanar reconstructed images and MIPs were obtained and reviewed to evaluate the vascular anatomy. CONTRAST:  57mL ISOVUE-370 IOPAMIDOL (ISOVUE-370) INJECTION 76% COMPARISON:  02/21/2015 FINDINGS: CTA CHEST FINDINGS Cardiovascular: Replacement of the ascending thoracic aorta. The aortic graft is patent. There is dilatation of the native aortic root measuring roughly 4.2 cm at the sinuses of Valsalva and minimally changed from the previous examination. Again noted is aortic dissection just beyond the ascending aortic graft. Dissection extends into the proximal right brachiocephalic artery and terminates proximal to the right subclavian artery. Great vessels are patent. Typical arch configuration. The left common carotid artery and left subclavian artery appear to be arising from the true lumen. True lumen is along the lateral aspect of the descending thoracic aorta. Dissection extends throughout the descending thoracic aorta into the abdominal aorta. Distal aortic arch measures roughly 3.3 cm in diameter and stable. Proximal descending thoracic aorta measures 3.8 cm and previously measured 3.6 cm. Mid descending thoracic aorta measures 3.1 cm previously measured 3.0 cm. The aorta at the hiatus measures 3.2 cm  and previously measured 3.0 cm. Overall, minimal enlargement of the descending thoracic aorta. Contrast was injected through the left arm. There is filling of multiple small paraspinal veins around the upper thoracic spine. SVC and central veins are patent. Mediastinum/Nodes: Thyroid tissue is mildly heterogeneous. No lymph node enlargement in the mediastinum or hila. No axillary lymph node enlargement. Lungs/Pleura: Trachea and mainstem bronchi are patent. No large pleural effusions. Lungs are clear without airspace disease or consolidation. Musculoskeletal: Median sternotomy wires. No acute bone abnormality. Review of the MIP images confirms the above findings. CTA ABDOMEN AND PELVIS FINDINGS VASCULAR Aorta: Aortic dissection extends throughout the abdominal aorta and terminates just above the bifurcation. Extent of the dissection is unchanged. Aorta at the level of the celiac trunk measures 3.3 cm and previously measured 2.9 cm. Infrarenal abdominal aorta measures 2.2 cm and previously measured 2.1 cm. Incomplete contrast opacification of the false lumen. Celiac: Celiac trunk originates from the true lumen. Celiac trunk is widely patent without significant atherosclerotic disease or stenosis. SMA: SMA region originates from the true lumen. SMA is patent without significant atherosclerotic disease or stenosis. Renals:  Right renal artery originates from the true lumen without significant stenosis or plaque. The main left renal artery originates from the false lumen and there is very little contrast in the main left renal artery but this could be related to the timing of the study. Accessory left renal artery originate near the dissection flap and there may be flow from the true lumen. IMA: IMA originates from the true lumen. IMA is patent without significant stenosis or plaque. Inflow: Dissection does not extend into the iliac arteries. The iliac arteries are patent bilaterally without stenosis or dilatation. Mild  atherosclerotic disease in the common iliac arteries. Proximal femoral arteries are patent bilaterally. Veins: No obvious venous abnormality within the limitations of this arterial phase study. Review of the MIP images confirms the above findings. NON-VASCULAR Hepatobiliary: Gallbladder appears to be surgically absent. No gross abnormality to the liver on this arterial phase of imaging. Pancreas: Normal appearance of the pancreas without inflammation or duct dilatation. Spleen: Normal appearance of spleen without enlargement. Adrenals/Urinary Tract: Stable low-density nodule in left adrenal gland that measures 10 Hounsfield units on the noncontrast images. Nodule measures roughly 2.3 cm and most compatible with a benign adenoma. Right adrenal gland is normal. Normal perfusion to the right kidney without hydronephrosis. No suspicious renal lesions. Delayed filling of the left kidney upper pole related to the flow from the false lumen. In addition, there has been interval atrophy of the left upper pole since the prior examination. Previously, there was a large stone in the left renal pelvis which is no longer present. Negative for left hydronephrosis. Urinary bladder is unremarkable. Stomach/Bowel: Evidence for small hiatal hernia. Normal appearance of the small bowel without obstruction. Few colonic diverticula without acute inflammatory changes. Lymphatic: No lymph node enlargement in the abdomen or pelvis. Reproductive: Uterus has been removed. Evidence for normal ovarian tissue on sequence 22, image 226 and 234. This ovarian tissue appears stable. Other: Trace fluid in the dependent aspect of the pelvis is similar to the previous examination. No free air. Musculoskeletal: No acute bone abnormality. Review of the MIP images confirms the above findings. IMPRESSION: Stable appearance of the surgically repaired ascending thoracic aorta. Stable extent and configuration of the aortic dissection extending from the  thoracic aortic graft to the distal abdominal aorta. Mild enlargement of the descending thoracic aorta and abdominal aorta. Stable enlargement of the native aortic root measuring roughly 4.2 cm. Recommend attention on follow up imaging. Increased atrophy of the left kidney upper pole secondary to flow from the false lumen. No acute abnormality in the chest, abdomen or pelvis. Electronically Signed: By: Markus Daft M.D. On: 04/08/2017 10:44    Ct Angio Chest/abd/pel For Dissection W And/or W/wo  Result Date: 03/08/2016 CLINICAL DATA:  62 year old female with chest tightness. History of type 1 aortic dissection repair. EXAM: CT ANGIOGRAPHY CHEST, ABDOMEN AND PELVIS TECHNIQUE: Multidetector CT imaging through the chest, abdomen and pelvis was performed using the standard protocol during bolus administration of intravenous contrast. Multiplanar reconstructed images and MIPs were obtained and reviewed to evaluate the vascular anatomy. CONTRAST:  80 cc Isovue 370 COMPARISON:  Chest radiograph dated 03/07/2016 and CT dated 02/21/2015 FINDINGS: CTA CHEST FINDINGS Cardiovascular: There is a stable moderate cardiomegaly. No pericardial effusion. Type A aortic dissection again noted with postsurgical changes of repair of the root of the aorta. The ascending aortic graft appears unremarkable and similar to the prior radiograph. There is no extravasation of contrast. There is contrast in both true and false lumen.  There is extension of the dissection flap into the proximal portion of the right innominate artery. This findings are similar to prior CT. The origins of the great vessels of the aortic arch appear to arise from the true lumen. The central pulmonary arteries appear unremarkable and patent. There is however a small pulmonary embolus in the subsegmental branch point of the right lower lobe (series 601 image 83 and coronal series 602, image 105). This clot appears nonocclusive and may be chronic in nature and  represent scarring. Correlation with clinical exam and history of prior TS recommended. Mediastinum/Nodes: There is no hilar or mediastinal adenopathy. The esophagus is grossly unremarkable. Lungs/Pleura: The lungs are clear. There is no pleural effusion or pneumothorax. The central airways are patent. Musculoskeletal: Median sternotomy wires. No acute osseous pathology. Review of the MIP images confirms the above findings. CTA ABDOMEN AND PELVIS FINDINGS VASCULAR Aorta: Type A dissection extends just above the aortic bifurcation. There is contrast in the false lumen although appears less dense compared to the contrast within the true lumen. Celiac: The origin of the celiac axis arises from the true lumen and appears patent. SMA: The origin of the SMA appears patent and arises from the true lumen. Renals: The right renal artery appears patent and arises from the true lumen. The left renal artery is patent as well. The origin of the left renal artery is not seen with certainty. IMA: The IMA is patent and arises from the true lumen. Inflow: Mild atherosclerotic calcification of the common iliac arteries. The common iliac external and internal iliac arteries are patent. Veins: No obvious venous abnormality within the limitations of this arterial phase study. Review of the MIP images confirms the above findings. No intra-abdominal free air.  Trace fluid within the pelvis NON-VASCULAR Hepatobiliary: Cholecystectomy. The liver is unremarkable. No intrahepatic biliary ductal dilatation. Pancreas: Unremarkable. No pancreatic ductal dilatation or surrounding inflammatory changes. Spleen: Normal in size without focal abnormality. Adrenals/Urinary Tract: Stable 2.2 cm left adrenal adenoma. The right adrenal gland is unremarkable. There is left renal lower pole atrophy and scarring. The right kidney is unremarkable. There is no hydronephrosis on either side. The visualized ureters and urinary bladder appear unremarkable.  Stomach/Bowel: There is colonic diverticulosis without active inflammatory changes. Moderate amount of stool noted throughout the colon. There is no evidence of bowel obstruction or active inflammation. The appendix is not visualized with certainty. No inflammatory changes identified in the right lower quadrant. Lymphatic: No adenopathy. Reproductive: Hysterectomy. Other: None Musculoskeletal: Degenerate changes of the spine. No acute fracture. Review of the MIP images confirms the above findings. IMPRESSION: 1. Stable appearing type A aortic dissection status post prior repair of the aortic root. Overall the appearance of the aorta and dissection is similar to the prior study. No periaortic inflammation or extravasation of contrast. 2. Small pulmonary embolism involving the subsegmental branch point of the right lower lobe. This may represent an acute nonocclusive PE versus chronic scarring. Correlation with clinical exam and history of prior PE recommended. 3. Moderate cardiomegaly similar to prior CT. 4. Colonic diverticulosis without active inflammatory changes. No bowel obstruction. 5. Trace free fluid within the pelvis of indeterminate etiology. These results were called by telephone at the time of interpretation on 03/08/2016 at 3:01 am to Dr. Tennis Must , who verbally acknowledged these results. Electronically Signed   By: Anner Crete M.D.   On: 03/08/2016 03:08    Ct Angio Chest Aorta W/cm &/or Wo/cm  02/21/2015  CLINICAL DATA:  History of aortic dissection with prior surgeon EXAM: CT ANGIOGRAPHY CHEST CT ABDOMEN AND PELVIS WITH CONTRAST TECHNIQUE: Multidetector CT imaging of the chest was performed using the standard protocol during bolus administration of intravenous contrast. Multiplanar CT image reconstructions and MIPs were obtained to evaluate the vascular anatomy. Multidetector CT imaging of the abdomen and pelvis was performed using the standard protocol during bolus administration of  intravenous contrast. CONTRAST:  75 mL Isovue 370 COMPARISON:  None. FINDINGS: CTA CHEST FINDINGS There are changes consistent with the patient's known history of type 1 aortic dissection with interval surgical repair of the ascending aorta. The ascending tube graft appears well placed without extravasation. Flow into both the false and true lumens is noted. The dissection flap extends into the proximal portion of the innominate artery on the right similar that seen on the prior exam. The false lumen again supplies flow to the left common carotid artery and left subclavian artery. The lungs are well aerated bilaterally. No sizable hilar or mediastinal adenopathy is noted. Bilateral thyroid nodules are again noted stable. No bony abnormality is noted. CT ABDOMEN and PELVIS FINDINGS The liver demonstrates some diffuse decreased attenuation consistent with fatty infiltration. The gallbladder has been surgically removed. The spleen, pancreas and right adrenal gland are within normal limits. A hypodense lesion is noted arising from the left adrenal gland which is stable in appearance likely representing an adenoma. The right kidney demonstrates a normal enhancement pattern. The left kidney demonstrates decreased enhancement similar to that seen on the prior exam. The dominant left renal artery is noted arising from the true lumen and occluding just beyond its origin. An accessory left renal artery is noted arising from the false lumen and remains patent. Relative sparing of the lower pole of the left kidney is noted. A large left renal pelvis stone as well as a smaller lower pole stone are again identified and stable. The bladder is partially distended. No pelvic mass lesion is noted. Scattered diverticular change of the colon is noted. No acute bony abnormality is noted. Vascular: The false lumen of the dissection again supplies the celiac axis, superior mesenteric artery, right renal artery and inferior mesenteric  artery. Flow to these vessels is widely patent. The dissection flap extends to just above the aortic bifurcation. Some fenestrations in the distal flap are noted. Flow via the false lumen into the iliac arteries is noted bilaterally. The previously seen extension of the dissection into the left common iliac artery is no longer identified. Review of the MIP images confirms the above findings. 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 The tube graft is widely patent with flow into both the true and false lumens. The dissection flap terminates just above the aortic bifurcation with improved flow into the left iliac artery when compared with the prior exam. No new focal dissection or abnormality is noted. Visceral flow is similar to that noted on the prior exam. Left renal calculi without obstructive change. 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 No acute abnormality is noted. Electronically Signed   By: Inez Catalina M.D.   On: 02/21/2015 13:39      Recent Lab Findings: Lab Results  Component Value Date   WBC 6.9 08/31/2016   HGB 13.1 08/31/2016   HCT 39.5 08/31/2016   PLT 236 08/31/2016   GLUCOSE  96 02/15/2017   CHOL 104 02/15/2017   TRIG 88 02/15/2017   HDL 48 02/15/2017   LDLCALC 38 02/15/2017   ALT 13 02/15/2017   AST 11 02/15/2017   NA 141 02/15/2017   K 4.4 02/15/2017   CL 106 02/15/2017   CREATININE 1.05 (H) 02/15/2017   BUN 13 02/15/2017   CO2 23 02/15/2017   TSH 0.968 08/31/2016   INR 0.94 04/26/2015   HGBA1C 6.0 (H) 08/31/2016   ECHO:08/07/2014 Study Conclusions  - Left ventricle: The cavity size was normal. There was mild focal basal hypertrophy of the septum. Systolic function was normal. The estimated ejection fraction was in the range of 55% to 60%. Wall motion was normal; there were no  regional wall motion abnormalities. There was an increased relative contribution of atrial contraction to ventricular filling. Doppler parameters are consistent with abnormal left ventricular relaxation (grade 1 diastolic dysfunction). - Aortic valve: Trileaflet; normal thickness, mildly calcified leaflets. There was trivial regurgitation. - Aorta: Aortic root dimension: 42 mm (ED). - Aortic root: The aortic root was mildly dilated. - Mitral valve: There was trivial regurgitation.   Assessment / Plan:   Patient status post replacement of the ascending aorta and resuspension of aortic valve for type I aortic dissection in 2016.  The patient's blood pressure has been under better control with current management.  She has no murmur of aortic insufficiency noted. Stable appearance of the surgically repaired ascending thoracic aorta. Stable extent and configuration of the aortic dissection extending from the thoracic aortic graft to the distal abdominal aorta.  Consider echocardiogram on her next follow-up with cardiology to evaluate the resuspended aortic valve and aortic root  Plan to see her back in 1 year with a follow-up CTA of the chest and abdomen  Grace Isaac MD      [address] [place] [address] Office [phone]   Beeper [phone]  04/08/2017 7:45 PM

## 2017-05-10 ENCOUNTER — Ambulatory Visit (INDEPENDENT_AMBULATORY_CARE_PROVIDER_SITE_OTHER): Payer: Medicare Other | Admitting: Cardiology

## 2017-05-10 ENCOUNTER — Encounter: Payer: Self-pay | Admitting: Cardiology

## 2017-05-10 VITALS — BP 158/70 | HR 67 | Ht 63.0 in | Wt 200.0 lb

## 2017-05-10 DIAGNOSIS — Z9989 Dependence on other enabling machines and devices: Secondary | ICD-10-CM | POA: Diagnosis not present

## 2017-05-10 DIAGNOSIS — I10 Essential (primary) hypertension: Secondary | ICD-10-CM

## 2017-05-10 DIAGNOSIS — G4733 Obstructive sleep apnea (adult) (pediatric): Secondary | ICD-10-CM | POA: Diagnosis not present

## 2017-05-10 HISTORY — DX: Obstructive sleep apnea (adult) (pediatric): G47.33

## 2017-05-10 MED ORDER — DOXAZOSIN MESYLATE 2 MG PO TABS: 2.0000 mg | ORAL_TABLET | Freq: Every day | ORAL | 11 refills | Status: DC

## 2017-05-10 NOTE — Progress Notes (Signed)
Cardiology Office Note:    Date:  05/10/2017   ID:  Alexis Wheeler, DOB Mar 18, 1955, MRN [id number]  PCP:  Alexis Ebbs, MD  Cardiologist:  No primary care provider on file.    Referring MD: Alexis Ebbs, MD   Chief Complaint  Patient presents with  . Sleep Apnea  . Hypertension    History of Present Illness:    Alexis Wheeler is a 62 y.o. female with a hx of CAD, HTN, morbid obesity who was referred by Alexis Merle, NP with elevated Epworth sleepiness score of 10.  She was found to have mild OSA with an AHI of 5.2/hr overall but severe during REM sleep with an AHI of 41.4/hr and underwent CPAP titration to 12cm H2O.  She is doing well with her CPAP device but has not been very compliant with it.  She does not like the way the masks feel on her face.  She has tried a nasal pillow mask which she did not like in her nose.  She has tried and nasal mask and full face mask but it leaks.  Right now she is using the full face mask. When she uses her CPAP she feels rested in the am and has no significant daytime sleepiness.  She denies any significant mouth or nasal dryness or nasal congestion.    Past Medical History:  Diagnosis Date  . Anemia    childhood  . Arthritis    right knee  . Blood transfusion without reported diagnosis    with heart surgery-repair aorta  . CAD (coronary artery disease)    a. 04/2015: cath showed 30% Ost RCA stenosis, 20% mid-LAD stenosis, and normal LV function.   . Cataract    bilateral eyes, per pt.  . Congestive dilated cardiomyopathy ([place]) 08/01/2014  . GERD   . HEMATURIA UNSPECIFIED   . History of kidney stones   . HYPERLIPIDEMIA   . HYPERTENSION   . Morbid obesity ([place])   . Myocardial infarction ([place])    01/30/2014  . MYOSITIS   . OSA on CPAP 05/10/2017   mild OSA with an AHI of 5.2/hr overall but severe during REM sleep with an AHI of 41.4/hr and underwent CPAP titration to 12cm H2O  . Sleep apnea    cpap-  doesnt use it- last study  years ago  . Vertigo     Past Surgical History:  Procedure Laterality Date  . ABDOMINAL HYSTERECTOMY  1983  . CARDIAC CATHETERIZATION N/A 04/30/2015   Procedure: Left Heart Cath and Coronary Angiography;  Surgeon: Alexis M Martinique, MD;  Location: [facility];  Service: Cardiovascular;  Laterality: N/A;  . CHOLECYSTECTOMY  2001  . CYSTOSCOPY W/ RETROGRADES Left 08/02/2015   Procedure: CYSTOSCOPY WITH LEFT RETROGRADE PYELOGRAM;  Surgeon: Alexis Aloe, MD;  Location: [facility];  Service: Urology;  Laterality: Left;  . CYSTOSCOPY WITH URETEROSCOPY, STONE BASKETRY AND STENT PLACEMENT Left 08/02/2015   Procedure:  Alexis Wheeler ;  Surgeon: Alexis Aloe, MD;  Location: [facility];  Service: Urology;  Laterality: Left;  . CYSTOSCOPY/URETEROSCOPY/HOLMIUM LASER/STENT PLACEMENT Left 08/02/2015   Procedure: CYSTOSCOPY/URETEROSCOPY/HOLMIUM LASER/STENT PLACEMENT-LEFT;  Surgeon: Alexis Aloe, MD;  Location: [facility];  Service: Urology;  Laterality: Left;  . HOLMIUM LASER APPLICATION Left 03/27/4006   Procedure: HOLMIUM LASER APPLICATION;  Surgeon: Alexis Aloe, MD;  Location: [facility];  Service: Urology;  Laterality: Left;  . REPLACEMENT ASCENDING AORTA N/A 03/03/2014   Procedure: REPAIR OF ASCENDING AORTIC DISSECTION;  Surgeon: Alexis Isaac, MD;  Location: [facility];  Service: Open Heart Surgery;  Laterality: N/A;    Current Medications: Current Meds  Medication Sig  . amLODipine (NORVASC) 10 MG tablet Take 1 tablet (10 mg total) by mouth daily.  [name] aspirin EC 81 MG EC tablet Take 1 tablet (81 mg total) by mouth daily.  [name] atorvastatin (LIPITOR) 40 MG tablet Take 1 tablet (40 mg total) by mouth daily at 6 PM.  . carvedilol (COREG) 12.5 MG tablet Take 1 tablet (12.5 mg total) by mouth 2 (two) times daily.  . Cholecalciferol 1000 units tablet Take 1,000 Units by mouth daily.  . colestipol (COLESTID) 1 g tablet Take 1 tablet (1 g total) by mouth 2 (two) times daily.  [name] dicyclomine (BENTYL) 20 MG  tablet Take 1 tablet (20 mg total) by mouth every 8 (eight) hours.  . folic acid (FOLVITE) 1 MG tablet Take 1 mg by mouth daily.  [name] lisinopril (PRINIVIL,ZESTRIL) 40 MG tablet Take 1 tablet (40 mg total) by mouth daily.  [name] spironolactone (ALDACTONE) 50 MG tablet Take 1 tablet (50 mg total) by mouth daily.  . vitamin E 400 UNIT capsule Take 400 Units by mouth daily.     Allergies:   Hydralazine and Motrin [ibuprofen]   Social History   Socioeconomic History  . Marital status: Divorced    Spouse name: Not on file  . Number of children: 1  . Years of education: Not on file  . Highest education level: Not on file  Occupational History  . Not on file  Social Needs  . Financial resource strain: Not on file  . Food insecurity:    Worry: Not on file    Inability: Not on file  . Transportation needs:    Medical: Not on file    Non-medical: Not on file  Tobacco Use  . Smoking status: Never Smoker  . Smokeless tobacco: Never Used  . 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 Alcohol use: No  . Drug use: No  . Sexual activity: Never  Lifestyle  . Physical activity:    Days per week: Not on file    Minutes per session: Not on file  . Stress: Not on file  Relationships  . Social connections:    Talks on phone: Not on file    Gets together: Not on file    Attends religious service: Not on file    Active member of club or organization: Not on file    Attends meetings of clubs or organizations: Not on file    Relationship status: Not on file  Other Topics Concern  . Not on file  Social History Narrative  . Not on file     Family History: The patient's family history includes Aortic dissection (age of onset: 110) in her mother; Breast cancer in her sister; Breast cancer (age of onset: 33) in her mother; Colon cancer in her brother; Esophageal cancer in her brother; Hypertension  in her daughter, father, mother, and sister; Throat cancer in her brother. There is no history of Seizures, Liver cancer, Pancreatic cancer, Rectal cancer, or Stomach cancer.  ROS:   Please see the history of present illness.    ROS  All other systems reviewed and negative.   EKGs/Labs/Other Studies Reviewed:    The following studies were reviewed today: PAP download  EKG:  EKG is not ordered today.  Recent Labs: 08/31/2016: Hemoglobin 13.1; Platelets 236; TSH 0.968 02/15/2017: ALT 13; BUN 13; Creatinine, Ser 1.05; Potassium 4.4; Sodium 141   Recent Lipid Panel    Component Value Date/Time   CHOL 104 02/15/2017 0927   TRIG 88 02/15/2017 0927   HDL 48 02/15/2017 0927   CHOLHDL 2.2 02/15/2017 0927   CHOLHDL 2.9 12/24/2015 1059   VLDL 27 12/24/2015 1059   LDLCALC 38 02/15/2017 0927    Physical Exam:    VS:  BP (!) 158/70   Pulse 67   Ht 5\' 3"  (1.6 m)   Wt 200 lb (90.7 kg)   BMI 35.43 kg/m     Wt Readings from Last 3 Encounters:  05/10/17 200 lb (90.7 kg)  04/08/17 202 lb 12.8 oz (92 kg)  02/15/17 202 lb (91.6 kg)     GEN:  Well nourished, well developed in no acute distress HEENT: Normal NECK: No JVD; No carotid bruits LYMPHATICS: No lymphadenopathy CARDIAC: RRR, no murmurs, rubs, gallops RESPIRATORY:  Clear to auscultation without rales, wheezing or rhonchi  ABDOMEN: Soft, non-tender, non-distended MUSCULOSKELETAL:  No edema; No deformity  SKIN: Warm and dry NEUROLOGIC:  Alert and oriented x 3 PSYCHIATRIC:  Normal affect   ASSESSMENT:    1. OSA on CPAP   2. Benign essential HTN   3. Morbid obesity ([place])    PLAN:    In order of problems listed above:  1.  OSA - the patient is tolerating PAP therapy well without any problems. The PAP download was reviewed today and showed an AHI of 1.3/hr on 12 cm H2O with 30% compliance in using more than 4 hours nightly.  The patient has been using and benefiting from PAP use and will continue to benefit from therapy. I  have encouraged her to be more compliant with her device.  She understands that insurance will take her device back if she does not show at least 70% compliance in using more than 4 hours nigntly.  I have recommended that she make an appt with her DME to have a mask refit to see if they have suggestions on cutting back on the leakage.  2.  HTN - Bp is borderline controlled on exam today.  She will continue on amlodipine 10mg  daily, carvedilol 12.5mg  BID, Lisinopril 40mg  daily and spironolactone 50mg  daily.  I have asked her to check her BP daily for a week and call with results.   3.  Morbid Obesity - I have encouraged she to get into a routine exercise program and cut back on carbs and portions.    Medication Adjustments/Labs and Tests Ordered: Current medicines are reviewed at length with the patient today.  Concerns regarding medicines are outlined above.  No orders of the defined types were placed in this encounter.  No orders of the defined types were placed in this encounter.   Signed, Fransico Him, MD  05/10/2017 12:01 PM    [facility] [place]

## 2017-05-10 NOTE — Patient Instructions (Addendum)
Medication Instructions:  Your physician has recommended you make the following change in your medication:  START: Doxazosin 2 mg once a day at bedtime   If you need a refill on your cardiac medications, please contact your pharmacy first.  Labwork: None ordered   Testing/Procedures: None ordered   Follow-Up: Your physician recommends that you schedule a follow-up appointment in: 2 weeks at the hypertension clinic   Your physician recommends that you schedule a follow-up appointment in: 3 months with Dr. Radford Pax   Any Other Special Instructions Will Be Listed Below (If Applicable). Two Gram Sodium Diet 2000 mg  What is Sodium? Sodium is a mineral found naturally in many foods. The most significant source of sodium in the diet is table salt, which is about 40% sodium.  Processed, convenience, and preserved foods also contain a large amount of sodium.  The body needs only 500 mg of sodium daily to function,  A normal diet provides more than enough sodium even if you do not use salt.  Why Limit Sodium? A build up of sodium in the body can cause thirst, increased blood pressure, shortness of breath, and water retention.  Decreasing sodium in the diet can reduce edema and risk of heart attack or stroke associated with high blood pressure.  Keep in mind that there are many other factors involved in these health problems.  Heredity, obesity, lack of exercise, cigarette smoking, stress and what you eat all play a role.  General Guidelines:  Do not add salt at the table or in cooking.  One teaspoon of salt contains over 2 grams of sodium.  Read food labels  Avoid processed and convenience foods  Ask your dietitian before eating any foods not dicussed in the menu planning guidelines  Consult your physician if you wish to use a salt substitute or a sodium containing medication such as antacids.  Limit milk and milk products to 16 oz (2 cups) per day.  Shopping Hints:  READ LABELS!!  "Dietetic" does not necessarily mean low sodium.  Salt and other sodium ingredients are often added to foods during processing.   Menu Planning Guidelines Food Group Choose More Often Avoid  Beverages (see also the milk group All fruit juices, low-sodium, salt-free vegetables juices, low-sodium carbonated beverages Regular vegetable or tomato juices, commercially softened water used for drinking or cooking  Breads and Cereals Enriched white, wheat, rye and pumpernickel bread, hard rolls and dinner rolls; muffins, cornbread and waffles; most dry cereals, cooked cereal without added salt; unsalted crackers and breadsticks; low sodium or homemade bread crumbs Bread, rolls and crackers with salted tops; quick breads; instant hot cereals; pancakes; commercial bread stuffing; self-rising flower and biscuit mixes; regular bread crumbs or cracker crumbs  Desserts and Sweets Desserts and sweets mad with mild should be within allowance Instant pudding mixes and cake mixes  Fats Butter or margarine; vegetable oils; unsalted salad dressings, regular salad dressings limited to 1 Tbs; light, sour and heavy cream Regular salad dressings containing bacon fat, bacon bits, and salt pork; snack dips made with instant soup mixes or processed cheese; salted nuts  Fruits Most fresh, frozen and canned fruits Fruits processed with salt or sodium-containing ingredient (some dried fruits are processed with sodium sulfites        Vegetables Fresh, frozen vegetables and low- sodium canned vegetables Regular canned vegetables, sauerkraut, pickled vegetables, and others prepared in brine; frozen vegetables in sauces; vegetables seasoned with ham, bacon or salt pork  Condiments, Sauces, Miscellaneous  Salt substitute with physician's approval; pepper, herbs, spices; vinegar, lemon or lime juice; hot pepper sauce; garlic powder, onion powder, low sodium soy sauce (1 Tbs.); low sodium condiments (ketchup, chili sauce, mustard)  in limited amounts (1 tsp.) fresh ground horseradish; unsalted tortilla chips, pretzels, potato chips, popcorn, salsa (1/4 cup) Any seasoning made with salt including garlic salt, celery salt, onion salt, and seasoned salt; sea salt, rock salt, kosher salt; meat tenderizers; monosodium glutamate; mustard, regular soy sauce, barbecue, sauce, chili sauce, teriyaki sauce, steak sauce, Worcestershire sauce, and most flavored vinegars; canned gravy and mixes; regular condiments; salted snack foods, olives, picles, relish, horseradish sauce, catsup   Food preparation: Try these seasonings Meats:    Pork Sage, onion Serve with applesauce  Chicken Poultry seasoning, thyme, parsley Serve with cranberry sauce  Lamb Curry powder, rosemary, garlic, thyme Serve with mint sauce or jelly  Veal Marjoram, basil Serve with current jelly, cranberry sauce  Beef Pepper, bay leaf Serve with dry mustard, unsalted chive butter  Fish [place] leaf, dill Serve with unsalted lemon butter, unsalted parsley butter  Vegetables:    Asparagus Lemon juice   Broccoli Lemon juice   Carrots Mustard dressing parsley, mint, nutmeg, glazed with unsalted butter and sugar   Green beans Marjoram, lemon juice, nutmeg,dill seed   Tomatoes Basil, marjoram, onion   Spice /blend for [employer]" 4 tsp ground thyme 1 tsp ground sage 3 tsp ground rosemary 4 tsp ground marjoram   Test your knowledge 1. A product that says "Salt Free" may still contain sodium. True or False 2. Garlic Powder and Hot Pepper Sauce an be used as alternative seasonings.True or False 3. Processed foods have more sodium than fresh foods.  True or False 4. Canned Vegetables have less sodium than froze True or False  WAYS TO DECREASE YOUR SODIUM INTAKE 1. Avoid the use of added salt in cooking and at the table.  Table salt (and other prepared seasonings which contain salt) is probably one of the greatest sources of sodium in the diet.  Unsalted foods can gain flavor  from the sweet, sour, and butter taste sensations of herbs and spices.  Instead of using salt for seasoning, try the following seasonings with the foods listed.  Remember: how you use them to enhance natural food flavors is limited only by your creativity... Allspice-Meat, fish, eggs, fruit, peas, red and yellow vegetables Almond Extract-Fruit baked goods Anise Seed-Sweet breads, fruit, carrots, beets, cottage cheese, cookies (tastes like licorice) Basil-Meat, fish, eggs, vegetables, rice, vegetables salads, soups, sauces Bay Leaf-Meat, fish, stews, poultry Burnet-Salad, vegetables (cucumber-like flavor) Caraway Seed-Bread, cookies, cottage cheese, meat, vegetables, cheese, rice Cardamon-Baked goods, fruit, soups Celery Powder or seed-Salads, salad dressings, sauces, meatloaf, soup, bread.Do not use  celery salt Chervil-Meats, salads, fish, eggs, vegetables, cottage cheese (parsley-like flavor) Chili Power-Meatloaf, chicken cheese, corn, eggplant, egg dishes Chives-Salads cottage cheese, egg dishes, soups, vegetables, sauces Cilantro-Salsa, casseroles Cinnamon-Baked goods, fruit, pork, lamb, chicken, carrots Cloves-Fruit, baked goods, fish, pot roast, green beans, beets, carrots Coriander-Pastry, cookies, meat, salads, cheese (lemon-orange flavor) Cumin-Meatloaf, fish,cheese, eggs, cabbage,fruit pie (caraway flavor) [employer], fruit, eggs, fish, poultry, cottage cheese, vegetables Dill Seed-Meat, cottage cheese, poultry, vegetables, fish, salads, bread Fennel Seed-Bread, cookies, apples, pork, eggs, fish, beets, cabbage, cheese, Licorice-like flavor Garlic-(buds or powder) Salads, meat, poultry, fish, bread, butter, vegetables, potatoes.Do not  use garlic salt Ginger-Fruit, vegetables, baked goods, meat, fish, poultry Horseradish Root-Meet, vegetables, butter Lemon Juice or Extract-Vegetables, fruit, tea, baked goods, fish salads Mace-Baked goods fruit,  vegetables, fish, poultry  (taste like nutmeg) Maple Extract-Syrups Marjoram-Meat, chicken, fish, vegetables, breads, green salads (taste like Sage) Mint-Tea, lamb, sherbet, vegetables, desserts, carrots, cabbage Mustard, Dry or Seed-Cheese, eggs, meats, vegetables, poultry Nutmeg-Baked goods, fruit, chicken, eggs, vegetables, desserts Onion Powder-Meat, fish, poultry, vegetables, cheese, eggs, bread, rice salads (Do not use   Onion salt) Orange Extract-Desserts, baked goods Oregano-Pasta, eggs, cheese, onions, pork, lamb, fish, chicken, vegetables, green salads Paprika-Meat, fish, poultry, eggs, cheese, vegetables Parsley Flakes-Butter, vegetables, meat fish, poultry, eggs, bread, salads (certain forms may   Contain sodium Pepper-Meat fish, poultry, vegetables, eggs Peppermint Extract-Desserts, baked goods Poppy Seed-Eggs, bread, cheese, fruit dressings, baked goods, noodles, vegetables, cottage  [employer], poultry, meat, fish, cauliflower, turnips,eggs bread Saffron-Rice, bread, veal, chicken, fish, eggs Sage-Meat, fish, poultry, onions, eggplant, tomateos, pork, stews Savory-Eggs, salads, poultry, meat, rice, vegetables, soups, pork Tarragon-Meat, poultry, fish, eggs, butter, vegetables (licorice-like flavor)  Thyme-Meat, poultry, fish, eggs, vegetables, (clover-like flavor), sauces, soups Tumeric-Salads, butter, eggs, fish, rice, vegetables (saffron-like flavor) Vanilla Extract-Baked goods, candy Vinegar-Salads, vegetables, meat marinades Walnut Extract-baked goods, candy  2. Choose your Foods Wisely   The following is a list of foods to avoid which are high in sodium:  Meats-Avoid all smoked, canned, salt cured, dried and kosher meat and fish as well as Anchovies   [employer] meats:Bologna, Liverwurst, Pastrami Canned meat or fish  Marinated herring Caviar    Pepperoni Corned Beef   Pizza Dried chipped beef  Salami Frozen breaded fish or meat Salt  pork Frankfurters or hot dogs  Sardines Gefilte fish   Sausage Ham (boiled ham, Proscuitto Smoked butt    spiced ham)   Spam      TV Dinners Vegetables Canned vegetables (Regular) Relish Canned mushrooms  Sauerkraut Olives    Tomato juice Pickles  Bakery and Dessert Products Canned puddings  Cream pies Cheesecake   Decorated cakes Cookies  Beverages/Juices Tomato juice, regular  Gatorade   V-8 vegetable juice, regular  Breads and Cereals Biscuit mixes   Salted potato chips, corn chips, pretzels Bread stuffing mixes  Salted crackers and rolls Pancake and waffle mixes Self-rising flour  Seasonings Accent    Meat sauces Barbecue sauce  Meat tenderizer Catsup    Monosodium glutamate (MSG) Celery salt   Onion salt Chili sauce   Prepared mustard Garlic salt   Salt, seasoned salt, sea salt Gravy mixes   Soy sauce Horseradish   Steak sauce Ketchup   Tartar sauce Lite salt    Teriyaki sauce Marinade mixes   Worcestershire sauce  Others Baking powder   Cocoa and cocoa mixes Baking soda   Commercial casserole mixes Candy-caramels, chocolate  Dehydrated soups    Bars, fudge,nougats  Instant rice and pasta mixes Canned broth or soup  Maraschino cherries Cheese, aged and processed cheese and cheese spreads  Learning Assessment Quiz  Indicated T (for True) or F (for False) for each of the following statements:  1. _____ Fresh fruits and vegetables and unprocessed grains are generally low in sodium 2. _____ Water may contain a considerable amount of sodium, depending on the source 3. _____ You can always tell if a food is high in sodium by tasting it 4. _____ Certain laxatives my be high in sodium and should be avoided unless prescribed   by a physician or pharmacist 5. _____ Salt substitutes may be used freely by anyone on a sodium restricted diet 6. _____ Sodium is present in table salt, food additives  and as a natural component of   most foods 7. _____ Table salt is  approximately 90% sodium 8. _____ Limiting sodium intake may help prevent excess fluid accumulation in the body 9. _____ On a sodium-restricted diet, seasonings such as bouillon soy sauce, and    cooking wine should be used in place of table salt 10. _____ On an ingredient list, a product which lists monosodium glutamate as the first   ingredient is an appropriate food to include on a low sodium diet  Circle the best answer(s) to the following statements (Hint: there may be more than one correct answer)  11. On a low-sodium diet, some acceptable snack items are:    A. Olives  F. Bean dip   K. Grapefruit juice    B. Salted Pretzels G. Commercial Popcorn   L. Canned peaches    C. Carrot Sticks  H. Bouillon   M. Unsalted nuts   D. [place] fries  I. Peanut butter crackers N. Salami   E. Sweet pickles J. Tomato Juice   O. Pizza  12.  Seasonings that may be used freely on a reduced - sodium diet include   A. Lemon wedges F.Monosodium glutamate K. Celery seed    B.Soysauce   G. Pepper   L. Mustard powder   C. Sea salt  H. Cooking wine  M. Onion flakes   D. Vinegar  E. Prepared horseradish N. Salsa   E. Sage   J. Worcestershire sauce  O. Chutney   Thank you for choosing [facility], RN  [phone]  If you need a refill on your cardiac medications before your next appointment, please call your pharmacy.  Doxazosin tablets What is this medicine? DOXAZOSIN (dox AY zoe sin) is an antihypertensive. It works by relaxing the blood vessels. It is used to treat benign prostatic hyperplasia (BPH) in men and to treat high blood pressure in both men and women. This medicine may be used for other purposes; ask your health care provider or pharmacist if you have questions. COMMON BRAND NAME(S): Cardura What should I tell my health care provider before I take this medicine? They need to know if you have any of the following conditions: -kidney or liver disease -low blood  pressure -an unusual or allergic reaction to doxazosin, other medicines, foods, dyes, or preservatives -pregnant or trying to get pregnant -breast-feeding How should I use this medicine? Take this medicine by mouth with a glass of water. Follow the directions on the prescription label. Take your doses at regular intervals. Do not take your medicine more often than directed. Do not stop taking except on the advice of your doctor or health care professional. Talk to your pediatrician regarding the use of this medicine in children. Special care may be needed. Overdosage: If you think you have taken too much of this medicine contact a poison control center or emergency room at once. NOTE: This medicine is only for you. Do not share this medicine with others. What if I miss a dose? If you miss a dose, take it as soon as you can. If it is almost time for your next dose, take only that dose. Do not take double or extra doses. What may interact with this medicine? -cimetidine -medicines for colds or hay fever -medicines for overactive bladder -sildenafil -tadalafil -vardenafil This list may not describe all possible interactions. Give your health care provider a list of all the medicines, herbs, non-prescription drugs, or dietary supplements  you use. Also tell them if you smoke, drink alcohol, or use illegal drugs. Some items may interact with your medicine. What should I watch for while using this medicine? Visit your doctor or health care professional for regular checks on your progress. Check your blood pressure regularly. Ask your doctor or health care professional what your blood pressure should be and when you should contact him or her. Drowsiness and dizziness are more likely to occur after the first dose, after an increase in dose, or during hot weather or exercise. These effects can decrease once your body adjusts to this medicine. Do not drive, use machinery, or do anything that needs mental  alertness until you know how this drug affects you. Do not stand or sit up quickly, especially if you are an older patient. This reduces the risk of dizzy or fainting spells. Alcohol can make you more drowsy and dizzy. Avoid alcoholic drinks. Do not treat yourself for coughs, colds, or pain while you are taking this medicine without asking your doctor or health care professional for advice. Some ingredients may increase your blood pressure. Your mouth may get dry. Chewing sugarless gum or sucking hard candy, and drinking plenty of water may help. Contact your doctor if the problem does not go away or is severe. If you are going to have eye surgery for cataracts, tell your doctor or health care professional you are taking this medicine. A condition called Intraoperative Floppy Iris Syndrome (IFIS) can happen if you have taken this medicine. For males, contact your doctor or health care professional right away if you have an erection that lasts longer than 4 hours or if it becomes painful. This may be a sign of a serious problem and must be treated right away to prevent permanent damage. What side effects may I notice from receiving this medicine? Side effects that you should report to your doctor or health care professional as soon as possible: -allergic reactions like skin rash, itching or hives, swelling of the face, lips, or tongue -breathing problems -changes in vision -chest pain -fast or irregular heartbeat -feeling faint or lightheaded, falls -males: prolonged or painful erection -numbness in hands or feet -swelling of the legs or ankles -unusually weak or tired Side effects that usually do not require medical attention (report to your doctor or health care professional if they continue or are bothersome): -headache -nausea This list may not describe all possible side effects. Call your doctor for medical advice about side effects. You may report side effects to FDA at  1-800-FDA-1088. Where should I keep my medicine? Keep out of the reach of children. Store at room temperature below 30 degrees C (86 degrees F). Throw away any unused medicine after the expiration date. NOTE: This sheet is a summary. It may not cover all possible information. If you have questions about this medicine, talk to your doctor, pharmacist, or health care provider.  2018 Elsevier/Gold Standard (2011-12-30 14:02:30)

## 2017-06-01 ENCOUNTER — Encounter: Payer: Self-pay | Admitting: Pharmacist

## 2017-06-01 ENCOUNTER — Ambulatory Visit (INDEPENDENT_AMBULATORY_CARE_PROVIDER_SITE_OTHER): Payer: Medicare Other | Admitting: Pharmacist

## 2017-06-01 VITALS — BP 134/72 | HR 65

## 2017-06-01 DIAGNOSIS — I10 Essential (primary) hypertension: Secondary | ICD-10-CM

## 2017-06-01 NOTE — Progress Notes (Signed)
Patient ID: Alexis Wheeler                 DOB: 07/28/1955                      MRN: [id number]     HPI: Alexis Wheeler is a 62 y.o. female patient of Dr. Radford Wheeler who presents today for hypertension evaluation. 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 At her most recent OV with Dr. Radford Wheeler no medication changes were made, but she was asked to keep log and follow up in HTN clinic.   She presents today for additional BP management. She denies dizziness. She states that she is SOB when exerting herself, but not at rest. She states she has some heartburn but not chest pain. She states headaches have improved. She endorses that in a week she misses her all of her medications about 1 time. She did take her medications this morning.   Current HTN meds:  Carvedilol 12.5mg  BID - has been taking both tablets in the morning (25mg  once daily) Spironolactone 50mg  daily in the morning Amlodipine 10mg  daily in morning Doxazosin 2mg  daily at bedtime Lisinopril 40mg  daily in the morning  Previously tried: hydralazine (makes her tired and irritated stomach),   BP goal: <130/80  Family History: Aortic dissection (age of onset: 55) in her mother; Breast cancer in her sister; Breast cancer (age of onset: 83) in her mother; Colon cancer in her brother; Esophageal cancer in her brother; Hypertension in her daughter, father, mother, and sister; Throat cancer in her brother.   Social History: The patient  reports that  has never smoked. she has never used smokeless tobacco. She reports that she does not drink alcohol or use drugs  Diet: Most meals prepared from home. She has been trying to just cook with salt and not use at the table. She has tried Ms. DASH to help. She does eat vegetables like broccoli, collards and spinach. She drinks water and Koolaid mostly. She does have  1 cup of coffee per day. She has one can of soda per day.   Exercise: She joined silver sneakers, but has missed a couple of days. She tries to go 3 days a week.   Home BP readings: 147/87 and 150/86 recently.   Wt Readings from Last 3 Encounters:  05/10/17 200 lb (90.7 kg)  04/08/17 202 lb 12.8 oz (92 kg)  02/15/17 202 lb (91.6 kg)   BP Readings from Last 3 Encounters:  06/01/17 134/72  05/10/17 (!) 158/70  04/08/17 (!) 158/78   Pulse Readings from Last 3 Encounters:  06/01/17 65  05/10/17 67  04/08/17 (!) 56    Renal function: CrCl cannot be calculated (Patient's most recent lab result is older than the maximum 21 days allowed.).  Past Medical History:  Diagnosis Date  . Anemia    childhood  . Arthritis    right knee  . Blood transfusion without reported diagnosis    with heart surgery-repair aorta  . CAD (coronary artery disease)    a. 04/2015: cath showed 30% Ost RCA stenosis, 20% mid-LAD stenosis, and normal LV function.   . Cataract    bilateral eyes, per pt.  . Congestive dilated cardiomyopathy ([place]) 08/01/2014  . GERD   . HEMATURIA UNSPECIFIED   . History of kidney stones   .  HYPERLIPIDEMIA   . HYPERTENSION   . Morbid obesity ([place])   . Myocardial infarction ([place])    01/30/2014  . MYOSITIS   . OSA on CPAP 05/10/2017   mild OSA with an AHI of 5.2/hr overall but severe during REM sleep with an AHI of 41.4/hr and underwent CPAP titration to 12cm H2O  . Sleep apnea    cpap-  doesnt use it- last study years ago  . Vertigo     Current Outpatient Medications on File Prior to Visit  Medication Sig Dispense Refill  . amLODipine (NORVASC) 10 MG tablet Take 1 tablet (10 mg total) by mouth daily. 90 tablet 3  . aspirin EC 81 MG EC tablet Take 1 tablet (81 mg total) by mouth daily.    [name] atorvastatin (LIPITOR) 40 MG tablet Take 1 tablet (40 mg total) by mouth daily at 6 PM. 90 tablet 3  . carvedilol (COREG) 12.5 MG tablet Take 1 tablet (12.5 mg total) by mouth 2 (two)  times daily. 180 tablet 3  . Cholecalciferol 1000 units tablet Take 1,000 Units by mouth daily.    . colestipol (COLESTID) 1 g tablet Take 1 tablet (1 g total) by mouth 2 (two) times daily. 60 tablet 3  . dicyclomine (BENTYL) 20 MG tablet Take 1 tablet (20 mg total) by mouth every 8 (eight) hours. 60 tablet 2  . doxazosin (CARDURA) 2 MG tablet Take 1 tablet (2 mg total) by mouth at bedtime. 30 tablet 11  . folic acid (FOLVITE) 1 MG tablet Take 1 mg by mouth daily.    [name] lisinopril (PRINIVIL,ZESTRIL) 40 MG tablet Take 1 tablet (40 mg total) by mouth daily. 90 tablet 3  . spironolactone (ALDACTONE) 50 MG tablet Take 1 tablet (50 mg total) by mouth daily. 90 tablet 3  . vitamin E 400 UNIT capsule Take 400 Units by mouth daily.     No current facility-administered medications on file prior to visit.     Allergies  Allergen Reactions  . Hydralazine Nausea Only  . Motrin [Ibuprofen] Nausea Only    Upset GI (motrin brand causes the side effect)    Blood pressure 134/72, pulse 65.   Assessment/Plan: Hypertension: BP is borderline at goal. She has been taking carvedilol 25mg  once daily (both 12.5mg  tablets at once rather than 12.5mg  BID). Will separate her doses to 12.5mg  BID. Also had lengthy conversation about adherence and lifestyle modifications as it applies to blood pressure control. Encouraged her to restart her workout program and be compliant with medications. If she is unable to take her carvedilol BID we may need to consider change to Toprol to help with compliance (though this will be less potent on pressures). Have asked her to keep log and follow up in HTN clinic in 4-6 weeks for additional management.    Thank you, Lelan Pons. Patterson Hamm[place], [facility]  06/01/2017 10:25 AM

## 2017-06-01 NOTE — Patient Instructions (Addendum)
Return for a follow up appointment in 4-6 weeks.   Your blood pressure goal is less than 130/80  Check your blood pressure at home daily (if able) and keep record of the readings.  Take your BP meds as follows: START taking carvedilol 12.5mg  (1 tablet) TWICE daily about 12 hours apart  CONTINUE all other medications as prescribed.   BE SURE NOT TO MISS doses of your medications. This will help your blood pressure and heart.   Bring all of your meds, your BP cuff and your record of home blood pressures to your next appointment.  Exercise as you're able, try to walk approximately 30 minutes per day.  Keep salt intake to a minimum, especially watch canned and prepared boxed foods.  Eat more fresh fruits and vegetables and fewer canned items.  Avoid eating in fast food restaurants.    HOW TO TAKE YOUR BLOOD PRESSURE: . Rest 5 minutes before taking your blood pressure. .  Don't smoke or drink caffeinated beverages for at least 30 minutes before. . Take your blood pressure before (not after) you eat. . Sit comfortably with your back supported and both feet on the floor (don't cross your legs). . Elevate your arm to heart level on a table or a desk. . Use the proper sized cuff. It should fit smoothly and snugly around your bare upper arm. There should be enough room to slip a fingertip under the cuff. The bottom edge of the cuff should be 1 inch above the crease of the elbow. . Ideally, take 3 measurements at one sitting and record the average.

## 2017-06-10 ENCOUNTER — Encounter

## 2017-06-10 ENCOUNTER — Encounter: Payer: Self-pay | Admitting: Gastroenterology

## 2017-06-10 ENCOUNTER — Ambulatory Visit (INDEPENDENT_AMBULATORY_CARE_PROVIDER_SITE_OTHER): Payer: Medicare Other | Admitting: Gastroenterology

## 2017-06-10 VITALS — BP 130/80 | HR 72 | Ht 63.0 in | Wt 201.2 lb

## 2017-06-10 DIAGNOSIS — R1030 Lower abdominal pain, unspecified: Secondary | ICD-10-CM

## 2017-06-10 DIAGNOSIS — R140 Abdominal distension (gaseous): Secondary | ICD-10-CM | POA: Diagnosis not present

## 2017-06-10 DIAGNOSIS — K582 Mixed irritable bowel syndrome: Secondary | ICD-10-CM

## 2017-06-10 MED ORDER — COLESEVELAM HCL 625 MG PO TABS: 625.0000 mg | ORAL_TABLET | Freq: Two times a day (BID) | ORAL | 2 refills | Status: DC

## 2017-06-10 NOTE — Patient Instructions (Signed)
STOP Colestid   We have sent Welchol to your pharmacy  Take IBGard over the counter 1 capsule three times a day  Continue Miralax daily as needed   Follow up in 6 months   If you are age 63 or older, your body mass index should be between 23-30. Your Body mass index is 35.65 kg/m. If this is out of the aforementioned range listed, please consider follow up with your Primary Care Provider.  If you are age 74 or younger, your body mass index should be between 19-25. Your Body mass index is 35.65 kg/m. If this is out of the aformentioned range listed, please consider follow up with your Primary Care Provider.

## 2017-06-10 NOTE — Progress Notes (Signed)
Alexis Wheeler    [id number]    October 15, 1955  Primary Care Physician:Avbuere, Christean Grief, MD  Referring Physician: Nolene Ebbs, [place], [place] [address]  Chief complaint: Irritable bowel syndrome, constipation, diarrhea HPI:  62 year old female with history of aortic dissection status post repair in 2016, hypertension, hyperlipidemia chronic irritable bowel syndrome with alternating constipation and diarrhea here for follow-up visit. Diarrhea has improved significantly and is having more issue with constipation.  She stopped taking Colestid routinely as it was making her more constipated and is taking it only as needed.  She is using dicyclomine as needed for abdominal cramps and discomfort with improvement. Colonoscopy February 08, 2017: 4 mm polyp removed from sigmoid colon, small internal hemorrhoids otherwise normal exam.  Pathology polyp removed was benign colorectal mucosa with no adenomatous tissue.  Outpatient Encounter Medications as of 06/10/2017  Medication Sig  . amLODipine (NORVASC) 10 MG tablet Take 1 tablet (10 mg total) by mouth daily.  [name] aspirin EC 81 MG EC tablet Take 1 tablet (81 mg total) by mouth daily.  [name] atorvastatin (LIPITOR) 40 MG tablet Take 1 tablet (40 mg total) by mouth daily at 6 PM.  . carvedilol (COREG) 12.5 MG tablet Take 1 tablet (12.5 mg total) by mouth 2 (two) times daily.  . Cholecalciferol 1000 units tablet Take 1,000 Units by mouth daily.  . colestipol (COLESTID) 1 g tablet Take 1 tablet (1 g total) by mouth 2 (two) times daily.  [name] dicyclomine (BENTYL) 20 MG tablet Take 1 tablet (20 mg total) by mouth every 8 (eight) hours.  [name] doxazosin (CARDURA) 2 MG tablet Take 1 tablet (2 mg total) by mouth at bedtime.  . folic acid (FOLVITE) 1 MG tablet Take 1 mg by mouth daily.  [name] lisinopril (PRINIVIL,ZESTRIL) 40 MG tablet Take 1 tablet (40 mg total) by mouth daily.  [name] spironolactone (ALDACTONE) 50 MG tablet Take 1 tablet (50 mg  total) by mouth daily.  . vitamin E 400 UNIT capsule Take 400 Units by mouth daily.  . colesevelam (WELCHOL) 625 MG tablet Take 1 tablet (625 mg total) by mouth 2 (two) times daily with a meal.   No facility-administered encounter medications on file as of 06/10/2017.     Allergies as of 06/10/2017 - Review Complete 06/10/2017  Allergen Reaction Noted  . Hydralazine Nausea Only 04/16/2015  . Motrin [ibuprofen] Nausea Only     Past Medical History:  Diagnosis Date  . Anemia    childhood  . Arthritis    right knee  . Blood transfusion without reported diagnosis    with heart surgery-repair aorta  . CAD (coronary artery disease)    a. 04/2015: cath showed 30% Ost RCA stenosis, 20% mid-LAD stenosis, and normal LV function.   . Cataract    bilateral eyes, per pt.  . Congestive dilated cardiomyopathy ([place]) 08/01/2014  . GERD   . HEMATURIA UNSPECIFIED   . History of kidney stones   . HYPERLIPIDEMIA   . HYPERTENSION   . Morbid obesity ([place])   . Myocardial infarction ([place])    01/30/2014  . MYOSITIS   . OSA on CPAP 05/10/2017   mild OSA with an AHI of 5.2/hr overall but severe during REM sleep with an AHI of 41.4/hr and underwent CPAP titration to 12cm H2O  . Sleep apnea    cpap-  doesnt use it- last study years ago  . Vertigo     Past Surgical  History:  Procedure Laterality Date  . ABDOMINAL HYSTERECTOMY  1983  . CARDIAC CATHETERIZATION N/A 04/30/2015   Procedure: Left Heart Cath and Coronary Angiography;  Surgeon: Peter M Martinique, MD;  Location: [facility];  Service: Cardiovascular;  Laterality: N/A;  . CHOLECYSTECTOMY  2001  . CYSTOSCOPY W/ RETROGRADES Left 08/02/2015   Procedure: CYSTOSCOPY WITH LEFT RETROGRADE PYELOGRAM;  Surgeon: Festus Aloe, MD;  Location: [facility];  Service: Urology;  Laterality: Left;  . CYSTOSCOPY WITH URETEROSCOPY, STONE BASKETRY AND STENT PLACEMENT Left 08/02/2015   Procedure:  Mitzi Davenport ;  Surgeon: Festus Aloe, MD;  Location: [facility];   Service: Urology;  Laterality: Left;  . CYSTOSCOPY/URETEROSCOPY/HOLMIUM LASER/STENT PLACEMENT Left 08/02/2015   Procedure: CYSTOSCOPY/URETEROSCOPY/HOLMIUM LASER/STENT PLACEMENT-LEFT;  Surgeon: Festus Aloe, MD;  Location: [facility];  Service: Urology;  Laterality: Left;  . HOLMIUM LASER APPLICATION Left 9/52/8413   Procedure: HOLMIUM LASER APPLICATION;  Surgeon: Festus Aloe, MD;  Location: [facility];  Service: Urology;  Laterality: Left;  . REPLACEMENT ASCENDING AORTA N/A 03/03/2014   Procedure: REPAIR OF ASCENDING AORTIC DISSECTION;  Surgeon: Grace Isaac, MD;  Location: [facility];  Service: Open Heart Surgery;  Laterality: N/A;    Family History  Problem Relation Age of Onset  . Breast cancer Mother 63  . Aortic dissection Mother 30  . Hypertension Mother   . Throat cancer Brother        1 bro living  . Colon cancer Brother   . Esophageal cancer Brother   . Hypertension Father   . Breast cancer Sister   . Hypertension Sister   . Hypertension Daughter   . Seizures Neg Hx   . Liver cancer Neg Hx   . Pancreatic cancer Neg Hx   . Rectal cancer Neg Hx   . Stomach cancer Neg Hx     Social History   Socioeconomic History  . Marital status: Divorced    Spouse name: Not on file  . Number of children: 1  . Years of education: Not on file  . Highest education level: Not on file  Occupational History  . Not on file  Social Needs  . Financial resource strain: Not on file  . Food insecurity:    Worry: Not on file    Inability: Not on file  . Transportation needs:    Medical: Not on file    Non-medical: Not on file  Tobacco Use  . Smoking status: Never Smoker  . Smokeless tobacco: Never Used  . 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 Alcohol use: No  . Drug use: No  . Sexual activity: Never  Lifestyle  . Physical activity:    Days per week: Not on file    Minutes  per session: Not on file  . Stress: Not on file  Relationships  . Social connections:    Talks on phone: Not on file    Gets together: Not on file    Attends religious service: Not on file    Active member of club or organization: Not on file    Attends meetings of clubs or organizations: Not on file    Relationship status: Not on file  . Intimate partner violence:    Fear of current or ex partner: Not on file    Emotionally abused: Not on file    Physically abused: Not on file  Forced sexual activity: Not on file  Other Topics Concern  . Not on file  Social History Narrative  . Not on file      Review of systems: Review of Systems  Constitutional: Negative for fever and chills.  Positive for lack of energy HENT: Positive for sinus problems Eyes: Negative for blurred vision.  Respiratory: Negative for cough, shortness of breath and wheezing.   Cardiovascular: Negative for chest pain and palpitations.  Gastrointestinal: as per HPI Genitourinary: Negative for dysuria, urgency, frequency and hematuria.  Musculoskeletal: Positive for myalgias, back pain and joint pain.  Skin: Negative for itching and rash.  Neurological: Negative for dizziness, tremors, focal weakness, seizures and loss of consciousness.  Endo/Heme/Allergies: Positive for seasonal allergies.  Psychiatric/Behavioral: Negative for depression, suicidal ideas and hallucinations.  All other systems reviewed and are negative.   Physical Exam: Vitals:   06/10/17 1023  BP: 130/80  Pulse: 72   Body mass index is 35.65 kg/m. Gen:      No acute distress HEENT:  EOMI, sclera anicteric Neck:     No masses; no thyromegaly Lungs:    Clear to auscultation bilaterally; normal respiratory effort CV:         Regular rate and rhythm; no murmurs Abd:      + bowel sounds; soft, non-tender; no palpable masses, no distension Ext:    No edema; adequate peripheral perfusion Skin:      Warm and dry; no rash Neuro: alert  and oriented x 3 Psych: normal mood and affect  Data Reviewed:  Reviewed labs, radiology imaging, old records and pertinent past GI work up   Assessment and Plan/Recommendations:  62 year old female with obesity, hypertension, hyperlipidemia, aortic dissection status post repair with chronic irritable bowel syndrome alternating constipation and diarrhea Will switch to WelChol 1 tablet daily instead of Colestid Advised patient to continue to avoid soda, drinks with high fructose corn syrup for artificial sweeteners Continue MiraLAX 1 capful daily as needed to improve constipation Continue dicyclomine 20 mg up to 3 times daily as needed for lower abdominal cramps  We will do a trial of IBgard 1 capsule 3 times daily as needed for IBS symptoms  Colorectal cancer screening: Average risk For recall colonoscopy January 2029  Return in 6 months or sooner if needed  25 minutes was spent face-to-face with the patient. Greater than 50% of the time used for counseling as well as treatment plan and follow-up. She had multiple questions which were answered to her satisfaction  K. Denzil Magnuson , MD [phone]    CC: Nolene Ebbs, MD

## 2017-06-11 ENCOUNTER — Encounter: Payer: Self-pay | Admitting: Gastroenterology

## 2017-06-29 ENCOUNTER — Encounter: Payer: Self-pay | Admitting: Pharmacist

## 2017-06-29 ENCOUNTER — Ambulatory Visit (INDEPENDENT_AMBULATORY_CARE_PROVIDER_SITE_OTHER): Payer: Medicare Other | Admitting: Pharmacist

## 2017-06-29 VITALS — BP 132/78 | HR 57

## 2017-06-29 DIAGNOSIS — I10 Essential (primary) hypertension: Secondary | ICD-10-CM

## 2017-06-29 NOTE — Progress Notes (Signed)
Patient ID: Alexis Wheeler                 DOB: March 27, 1955                      MRN: [id number]     HPI: Alexis Wheeler is a 62 y.o. female patient of Dr. Radford Pax who presents today for hypertension evaluation. 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 At her most recent OV with HTN clinic it was uncovered that she was taking full dose of carvedilol once daily rather than twice daily. Adherence was emphasized and she was scheduled for follow up in 4-6 weeks. Consider change to once daily betablocker if needed today.   She presents today for additional BP management. She had a little dizziness last week but other wise has done well. She says that she still gets short of breath when she exerts herself, but this is the same as always. She has been getting her medications in twice daily after her last visit.   She does have some pain in her shoulders. She occasionally takes ibuprofen for this, but not every day.   Current HTN meds:  Carvedilol 12.5mg  BID  Spironolactone 50mg  daily in the morning Amlodipine 10mg  daily in morning Doxazosin 2mg  daily at bedtime Lisinopril 40mg  daily in the morning  Previously tried: hydralazine (makes her tired and irritated stomach),   BP goal: <130/80  Family History: Aortic dissection (age of onset: 19) in her mother; Breast cancer in her sister; Breast cancer (age of onset: 47) in her mother; Colon cancer in her brother; Esophageal cancer in her brother; Hypertension in her daughter, father, mother, and sister; Throat cancer in her brother.   Social History: The patient  reports that  has never smoked. she has never used smokeless tobacco. She reports that she does not drink alcohol or use drugs  Diet: Most meals prepared from home. She has been trying to just cook with salt and not use at the table. She has tried Ms.  DASH to help. She does eat vegetables like broccoli, collards and spinach. She drinks water and Koolaid mostly. She does have 1 cup of coffee per day. She has one can of soda per day.   Exercise: She joined silver sneakers, but has missed a couple of days. She tries to go 3 days a week.   Home BP readings:  130s/80-90s HR 70s  Wt Readings from Last 3 Encounters:  06/10/17 201 lb 4 oz (91.3 kg)  05/10/17 200 lb (90.7 kg)  04/08/17 202 lb 12.8 oz (92 kg)   BP Readings from Last 3 Encounters:  06/29/17 132/78  06/10/17 130/80  06/01/17 134/72   Pulse Readings from Last 3 Encounters:  06/29/17 (!) 57  06/10/17 72  06/01/17 65    Renal function: CrCl cannot be calculated (Patient's most recent lab result is older than the maximum 21 days allowed.).  Past Medical History:  Diagnosis Date  . Anemia    childhood  . Arthritis    right knee  . Blood transfusion without reported diagnosis    with heart surgery-repair aorta  . CAD (coronary artery disease)    a. 04/2015: cath showed 30% Ost RCA stenosis, 20% mid-LAD stenosis, and normal LV function.   . Cataract    bilateral eyes, per pt.  [name]  Congestive dilated cardiomyopathy ([place]) 08/01/2014  . GERD   . HEMATURIA UNSPECIFIED   . History of kidney stones   . HYPERLIPIDEMIA   . HYPERTENSION   . Morbid obesity ([place])   . Myocardial infarction ([place])    01/30/2014  . MYOSITIS   . OSA on CPAP 05/10/2017   mild OSA with an AHI of 5.2/hr overall but severe during REM sleep with an AHI of 41.4/hr and underwent CPAP titration to 12cm H2O  . Sleep apnea    cpap-  doesnt use it- last study years ago  . Vertigo     Current Outpatient Medications on File Prior to Visit  Medication Sig Dispense Refill  . amLODipine (NORVASC) 10 MG tablet Take 1 tablet (10 mg total) by mouth daily. 90 tablet 3  . aspirin EC 81 MG EC tablet Take 1 tablet (81 mg total) by mouth daily.    [name] atorvastatin (LIPITOR) 40 MG tablet Take 1 tablet (40 mg total) by  mouth daily at 6 PM. 90 tablet 3  . carvedilol (COREG) 12.5 MG tablet Take 1 tablet (12.5 mg total) by mouth 2 (two) times daily. 180 tablet 3  . Cholecalciferol 1000 units tablet Take 1,000 Units by mouth daily.    . colesevelam (WELCHOL) 625 MG tablet Take 1 tablet (625 mg total) by mouth 2 (two) times daily with a meal. 30 tablet 2  . colestipol (COLESTID) 1 g tablet Take 1 tablet (1 g total) by mouth 2 (two) times daily. 60 tablet 3  . dicyclomine (BENTYL) 20 MG tablet Take 1 tablet (20 mg total) by mouth every 8 (eight) hours. 60 tablet 2  . doxazosin (CARDURA) 2 MG tablet Take 1 tablet (2 mg total) by mouth at bedtime. 30 tablet 11  . folic acid (FOLVITE) 1 MG tablet Take 1 mg by mouth daily.    [name] lisinopril (PRINIVIL,ZESTRIL) 40 MG tablet Take 1 tablet (40 mg total) by mouth daily. 90 tablet 3  . spironolactone (ALDACTONE) 50 MG tablet Take 1 tablet (50 mg total) by mouth daily. 90 tablet 3  . vitamin E 400 UNIT capsule Take 400 Units by mouth daily.     No current facility-administered medications on file prior to visit.     Allergies  Allergen Reactions  . Hydralazine Nausea Only  . Motrin [Ibuprofen] Nausea Only    Upset GI (motrin brand causes the side effect)    Blood pressure 132/78, pulse (!) 57.   Assessment/Plan: Hypertension: BP is borderline at goal <130/80 with medication compliance. Continue all medications as prescribed. Have asked she monitor HR and blood pressure and bring log to follow up with Dr. Radford Pax in July. Follow up in HTN clinic as needed.    Thank you, Lelan Pons. Patterson Hammersmith, [facility]  06/29/2017 4:45 PM

## 2017-06-29 NOTE — Patient Instructions (Addendum)
CONTINUE taking your medications as prescribed. Keep up the good work with getting all of your medications in each day!  Record your blood pressures and heart rate and bring to your appointment with Dr. Radford Pax in July.

## 2017-08-05 ENCOUNTER — Other Ambulatory Visit: Payer: Self-pay | Admitting: Gastroenterology

## 2017-08-05 ENCOUNTER — Encounter: Payer: Self-pay | Admitting: Cardiology

## 2017-08-10 ENCOUNTER — Encounter (INDEPENDENT_AMBULATORY_CARE_PROVIDER_SITE_OTHER): Payer: Self-pay

## 2017-08-10 ENCOUNTER — Encounter: Payer: Self-pay | Admitting: Cardiology

## 2017-08-10 ENCOUNTER — Ambulatory Visit (INDEPENDENT_AMBULATORY_CARE_PROVIDER_SITE_OTHER): Payer: Medicare Other | Admitting: Cardiology

## 2017-08-10 VITALS — BP 130/70 | HR 60 | Ht 63.0 in | Wt 204.0 lb

## 2017-08-10 DIAGNOSIS — Z9989 Dependence on other enabling machines and devices: Secondary | ICD-10-CM

## 2017-08-10 DIAGNOSIS — G4733 Obstructive sleep apnea (adult) (pediatric): Secondary | ICD-10-CM | POA: Diagnosis not present

## 2017-08-10 DIAGNOSIS — I10 Essential (primary) hypertension: Secondary | ICD-10-CM | POA: Diagnosis not present

## 2017-08-10 NOTE — Progress Notes (Signed)
Cardiology Office Note:    Date:  08/10/2017   ID:  Alexis Wheeler, DOB 06-11-1955, MRN [id number]  PCP:  Nolene Ebbs, MD  Cardiologist:  No primary care provider on file.    Referring MD: Nolene Ebbs, MD   Chief Complaint  Patient presents with  . Sleep Apnea  . Hypertension    History of Present Illness:    Alexis Wheeler is a 62 y.o. female with a hx of CAD, HTN, morbid obesity who was referred by Truitt Merle, NP with elevated Epworth sleepiness score of 10.  She was found to have mild OSA with an AHI of 5.2/hr overall but severe during REM sleep with an AHI of 41.4/hr and underwent CPAP titration to 12cm H2O.  She is doing well with her CPAP device and thinks that she has gotten used to it.  She tolerates the mask and feels the pressure is adequate.  Since going on CPAP she feels rested in the am and has no significant daytime sleepiness.  She denies any significant mouth or nasal dryness or nasal congestion.  She does not think that he snores.     Past Medical History:  Diagnosis Date  . Anemia    childhood  . Arthritis    right knee  . Blood transfusion without reported diagnosis    with heart surgery-repair aorta  . CAD (coronary artery disease)    a. 04/2015: cath showed 30% Ost RCA stenosis, 20% mid-LAD stenosis, and normal LV function.   . Cataract    bilateral eyes, per pt.  . Congestive dilated cardiomyopathy ([place]) 08/01/2014  . GERD   . HEMATURIA UNSPECIFIED   . History of kidney stones   . HYPERLIPIDEMIA   . HYPERTENSION   . Morbid obesity ([place])   . Myocardial infarction ([place])    01/30/2014  . MYOSITIS   . OSA on CPAP 05/10/2017   mild OSA with an AHI of 5.2/hr overall but severe during REM sleep with an AHI of 41.4/hr and underwent CPAP titration to 12cm H2O  . Sleep apnea    cpap-  doesnt use it- last study years ago  . Vertigo     Past Surgical History:  Procedure Laterality Date  . ABDOMINAL HYSTERECTOMY  1983  . CARDIAC  CATHETERIZATION N/A 04/30/2015   Procedure: Left Heart Cath and Coronary Angiography;  Surgeon: Peter M Martinique, MD;  Location: [facility];  Service: Cardiovascular;  Laterality: N/A;  . CHOLECYSTECTOMY  2001  . CYSTOSCOPY W/ RETROGRADES Left 08/02/2015   Procedure: CYSTOSCOPY WITH LEFT RETROGRADE PYELOGRAM;  Surgeon: Festus Aloe, MD;  Location: [facility];  Service: Urology;  Laterality: Left;  . CYSTOSCOPY WITH URETEROSCOPY, STONE BASKETRY AND STENT PLACEMENT Left 08/02/2015   Procedure:  Mitzi Davenport ;  Surgeon: Festus Aloe, MD;  Location: [facility];  Service: Urology;  Laterality: Left;  . CYSTOSCOPY/URETEROSCOPY/HOLMIUM LASER/STENT PLACEMENT Left 08/02/2015   Procedure: CYSTOSCOPY/URETEROSCOPY/HOLMIUM LASER/STENT PLACEMENT-LEFT;  Surgeon: Festus Aloe, MD;  Location: [facility];  Service: Urology;  Laterality: Left;  . HOLMIUM LASER APPLICATION Left 07/09/3660   Procedure: HOLMIUM LASER APPLICATION;  Surgeon: Festus Aloe, MD;  Location: [facility];  Service: Urology;  Laterality: Left;  . REPLACEMENT ASCENDING AORTA N/A 03/03/2014   Procedure: REPAIR OF ASCENDING AORTIC DISSECTION;  Surgeon: Grace Isaac, MD;  Location: [facility];  Service: Open Heart Surgery;  Laterality: N/A;    Current Medications: Current Meds  Medication Sig  . amLODipine (NORVASC) 10 MG tablet Take 1 tablet (  10 mg total) by mouth daily.  [name] aspirin EC 81 MG EC tablet Take 1 tablet (81 mg total) by mouth daily.  [name] atorvastatin (LIPITOR) 40 MG tablet Take 1 tablet (40 mg total) by mouth daily at 6 PM.  . carvedilol (COREG) 12.5 MG tablet Take 1 tablet (12.5 mg total) by mouth 2 (two) times daily.  . Cholecalciferol 1000 units tablet Take 1,000 Units by mouth daily.  . colesevelam (WELCHOL) 625 MG tablet Take 1 tablet (625 mg total) by mouth 2 (two) times daily with a meal.  . colestipol (COLESTID) 1 g tablet Take 1 tablet (1 g total) by mouth 2 (two) times daily.  [name] dicyclomine (BENTYL) 20 MG tablet TAKE 1  TABLET BY MOUTH EVERY 8 HOURS  . doxazosin (CARDURA) 2 MG tablet Take 1 tablet (2 mg total) by mouth at bedtime.  . folic acid (FOLVITE) 1 MG tablet Take 1 mg by mouth daily.  [name] lisinopril (PRINIVIL,ZESTRIL) 40 MG tablet Take 1 tablet (40 mg total) by mouth daily.  [name] spironolactone (ALDACTONE) 50 MG tablet Take 1 tablet (50 mg total) by mouth daily.  . vitamin E 400 UNIT capsule Take 400 Units by mouth daily.     Allergies:   Hydralazine and Motrin [ibuprofen]   Social History   Socioeconomic History  . Marital status: Divorced    Spouse name: Not on file  . Number of children: 1  . Years of education: Not on file  . Highest education level: Not on file  Occupational History  . Not on file  Social Needs  . Financial resource strain: Not on file  . Food insecurity:    Worry: Not on file    Inability: Not on file  . Transportation needs:    Medical: Not on file    Non-medical: Not on file  Tobacco Use  . Smoking status: Never Smoker  . Smokeless tobacco: Never Used  . 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 Alcohol use: No  . Drug use: No  . Sexual activity: Never  Lifestyle  . Physical activity:    Days per week: Not on file    Minutes per session: Not on file  . Stress: Not on file  Relationships  . Social connections:    Talks on phone: Not on file    Gets together: Not on file    Attends religious service: Not on file    Active member of club or organization: Not on file    Attends meetings of clubs or organizations: Not on file    Relationship status: Not on file  Other Topics Concern  . Not on file  Social History Narrative  . Not on file     Family History: The patient's family history includes Aortic dissection (age of onset: 56) in her mother; Breast cancer in her sister; Breast cancer (age of onset: 84) in her mother; Colon cancer in her brother;  Esophageal cancer in her brother; Hypertension in her daughter, father, mother, and sister; Throat cancer in her brother. There is no history of Seizures, Liver cancer, Pancreatic cancer, Rectal cancer, or Stomach cancer.  ROS:   Please see the history of present illness.    ROS  All other systems reviewed and negative.   EKGs/Labs/Other Studies Reviewed:    The following studies were reviewed today: PAP download  EKG:  EKG is  not ordered today.    Recent Labs: 08/31/2016: Hemoglobin 13.1; Platelets 236; TSH 0.968 02/15/2017: ALT 13; BUN 13; Creatinine, Ser 1.05; Potassium 4.4; Sodium 141   Recent Lipid Panel    Component Value Date/Time   CHOL 104 02/15/2017 0927   TRIG 88 02/15/2017 0927   HDL 48 02/15/2017 0927   CHOLHDL 2.2 02/15/2017 0927   CHOLHDL 2.9 12/24/2015 1059   VLDL 27 12/24/2015 1059   LDLCALC 38 02/15/2017 0927    Physical Exam:    VS:  BP 130/70 (BP Location: Right Arm, Patient Position: Sitting, Cuff Size: Large)   Pulse 60   Ht 5\' 3"  (1.6 m)   Wt 204 lb (92.5 kg)   SpO2 97%   BMI 36.14 kg/m     Wt Readings from Last 3 Encounters:  08/10/17 204 lb (92.5 kg)  06/10/17 201 lb 4 oz (91.3 kg)  05/10/17 200 lb (90.7 kg)     GEN:  Well nourished, well developed in no acute distress HEENT: Normal NECK: No JVD; No carotid bruits LYMPHATICS: No lymphadenopathy CARDIAC: RRR, no murmurs, rubs, gallops RESPIRATORY:  Clear to auscultation without rales, wheezing or rhonchi  ABDOMEN: Soft, non-tender, non-distended MUSCULOSKELETAL:  No edema; No deformity  SKIN: Warm and dry NEUROLOGIC:  Alert and oriented x 3 PSYCHIATRIC:  Normal affect   ASSESSMENT:    1. OSA on CPAP   2. Benign essential HTN   3. Morbid obesity ([place])    PLAN:    In order of problems listed above:  1.  OSA - the patient is tolerating PAP therapy well without any problems. The PAP download was reviewed today and showed an AHI of 3.6/hr on 12 cm H2O with 50% compliance in using  more than 4 hours nightly.  The patient has been using and benefiting from PAP use and will continue to benefit from therapy.  I have encouraged her to be more compliant with her device  2.  Hypertension -BP is well controlled on exam today.  She will continue on amlodipine 10 mg daily, carvedilol 12.5 mg twice daily, doxazosin 2 mg daily, lisinopril 40 mg daily, spironolactone 50 mg daily  3.  Obesity - I have encouraged her to get into a routine exercise program and cut back on carbs and portions.    Medication Adjustments/Labs and Tests Ordered: Current medicines are reviewed at length with the patient today.  Concerns regarding medicines are outlined above.  No orders of the defined types were placed in this encounter.  No orders of the defined types were placed in this encounter.   Signed, Fransico Him, MD  08/10/2017 9:11 AM    [place]

## 2017-08-10 NOTE — Patient Instructions (Signed)

## 2017-09-19 ENCOUNTER — Other Ambulatory Visit: Payer: Self-pay | Admitting: Nurse Practitioner

## 2017-09-19 DIAGNOSIS — I420 Dilated cardiomyopathy: Secondary | ICD-10-CM

## 2017-10-08 ENCOUNTER — Telehealth: Payer: Self-pay | Admitting: Nurse Practitioner

## 2017-10-08 NOTE — Telephone Encounter (Signed)
New Message:     Patient is requesting a call back  Patient stated she would like to discuss her issue with only the nurse.

## 2017-10-08 NOTE — Telephone Encounter (Signed)
Spoke with the patient, she asked if she could take a B vitamin supplement. Her PCP 2 weeks ago started her on B12 1071mcg. I advised her that she should contact her primary care office since they are working with her low energy. Advised if she had further cardiac concerns to call the office.

## 2017-11-02 ENCOUNTER — Other Ambulatory Visit: Payer: Self-pay | Admitting: Internal Medicine

## 2017-11-02 DIAGNOSIS — Z1231 Encounter for screening mammogram for malignant neoplasm of breast: Secondary | ICD-10-CM

## 2017-11-06 ENCOUNTER — Other Ambulatory Visit: Payer: Self-pay | Admitting: Nurse Practitioner

## 2017-11-06 DIAGNOSIS — I420 Dilated cardiomyopathy: Secondary | ICD-10-CM

## 2017-12-03 ENCOUNTER — Telehealth: Payer: Self-pay

## 2017-12-03 ENCOUNTER — Telehealth: Payer: Self-pay | Admitting: Nurse Practitioner

## 2017-12-03 NOTE — Telephone Encounter (Signed)
New message   Patient states that she has no energy and does not feel well.

## 2017-12-03 NOTE — Telephone Encounter (Signed)
Pt called to report that she still has been experiencing decreased fatigue and having very low energy.. Her pmd has been treating her for anemia and she has had trouble getting the right dose of her B12 from the pharmacy.. She denies sob, dizziness, chest pain... Her BP has been running 130's/70's and she says her HR in the 70's... I requested that she call her PMD and talk with him about her B12 and make an appt to see him for follow up.. I also asked if she was wearing her CPAP nightly and she reported that she was not using it every night.. I asked her to try to use it more regularly and see if that helps to improve her energy during the day.. Pt agrees and will forward to Dr.Turner for any further recommendations. Will forward message to Dr. Jeanie Cooks.

## 2017-12-03 NOTE — Telephone Encounter (Signed)
Ms. Bawa contacted the office with concerns about having no energy.  She stated she felt like she was "on too many blood pressure medications".  I advised her to contact her Cardiologist regarding her medications and adjustments.  She acknowledged receipt.  I aslo advised her to continue to use her CPAP at night which would help in her feeling better.  She acknowledged receipt, however, she did state she could not feel any difference when she wore it and when she did not.

## 2017-12-04 NOTE — Telephone Encounter (Signed)
Agree with recommendations.  

## 2017-12-06 NOTE — Telephone Encounter (Signed)
Spoke with the patient and let her know that Dr. Radford Pax agreed with the plan.

## 2017-12-14 ENCOUNTER — Ambulatory Visit
Admission: RE | Admit: 2017-12-14 | Discharge: 2017-12-14 | Disposition: A | Discharge status: Home / Self Care | Payer: Medicare Other | Source: Ambulatory Visit | Attending: Internal Medicine | Admitting: Internal Medicine

## 2017-12-14 DIAGNOSIS — Z1231 Encounter for screening mammogram for malignant neoplasm of breast: Secondary | ICD-10-CM

## 2018-01-04 IMAGING — CT CT ANGIO CHEST-ABD-PELV FOR DISSECTION W/ AND WO/W CM
1 of 12 series · 1 of 38 positions shown, 3 images · IV contrast (Iohexol (Omnipaque 350))
Comparison: Chest radiograph dated 03/07/2016 and CT dated
02/21/2015

CLINICAL DATA: 60-year-old female with chest tightness. History of
type 1 aortic dissection repair.

EXAM:
CT ANGIOGRAPHY CHEST, ABDOMEN AND PELVIS
TECHNIQUE: Multidetector CT imaging through the chest, abdomen and pelvis was
performed using the standard protocol during bolus administration of
intravenous contrast. Multiplanar reconstructed images and MIPs were
obtained and reviewed to evaluate the vascular anatomy.
CONTRAST:  80 cc Isovue 370

[Series 300: locator · axial · 0.38mm/px · z∈[-86,-86]mm · 1 of 1 slices shown, 3 images]
[im 1/1  mediastinal]
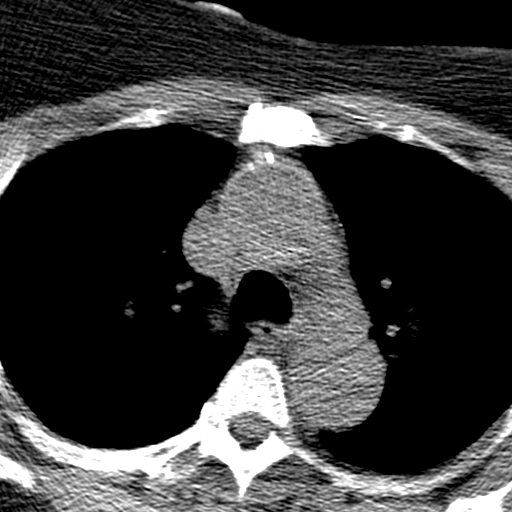
[im 1/1  lung]
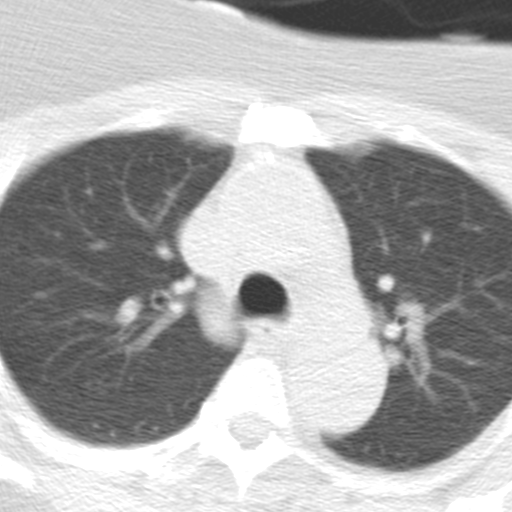
[im 1/1  bone]
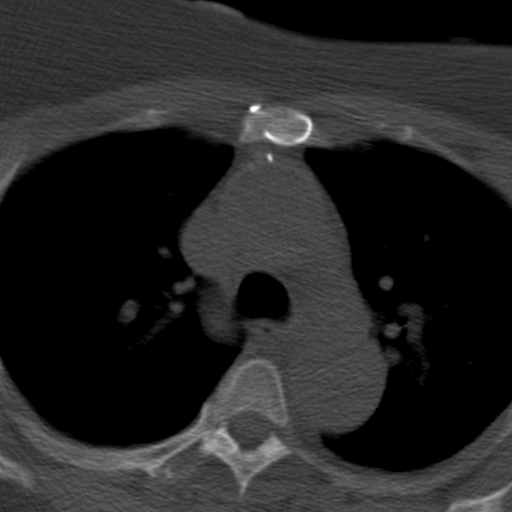

[1 of 38 positions shown; findings below may reference images not displayed]

FINDINGS: CTA CHEST FINDINGS

Cardiovascular: There is a stable moderate cardiomegaly. No
pericardial effusion. Type A aortic dissection again noted with
postsurgical changes of repair of the root of the aorta. The
ascending aortic graft appears unremarkable and similar to the prior
radiograph. There is no extravasation of contrast. There is contrast
in both true and false lumen. There is extension of the dissection
flap into the proximal portion of the right innominate artery. This
findings are similar to prior CT. The origins of the great vessels
of the aortic arch appear to arise from the true lumen. The central
pulmonary arteries appear unremarkable and patent. There is however
a small pulmonary embolus in the subsegmental branch point of the
right lower lobe (series 601 image 83 and coronal series 602, image
105). This clot appears nonocclusive and may be chronic in nature
and represent scarring. Correlation with clinical exam and history
of prior TS recommended.

Mediastinum/Nodes: There is no hilar or mediastinal adenopathy. The
esophagus is grossly unremarkable.

Lungs/Pleura: The lungs are clear. There is no pleural effusion or
pneumothorax. The central airways are patent.

Musculoskeletal: Median sternotomy wires. No acute osseous
pathology.

Review of the MIP images confirms the above findings.

CTA ABDOMEN AND PELVIS FINDINGS

VASCULAR

Aorta: Type A dissection extends just above the aortic bifurcation.
There is contrast in the false lumen although appears less dense
compared to the contrast within the true lumen.

Celiac: The origin of the celiac axis arises from the true lumen and
appears patent.

SMA: The origin of the SMA appears patent and arises from the true
lumen.

Renals: The right renal artery appears patent and arises from the
true lumen. The left renal artery is patent as well. The origin of
the left renal artery is not seen with certainty.

IMA: The IMA is patent and arises from the true lumen.

Inflow: Mild atherosclerotic calcification of the common iliac
arteries. The common iliac external and internal iliac arteries are
patent.

Veins: No obvious venous abnormality within the limitations of this
arterial phase study.

Review of the MIP images confirms the above findings.

No intra-abdominal free air.  Trace fluid within the pelvis

NON-VASCULAR

Hepatobiliary: Cholecystectomy. The liver is unremarkable. No
intrahepatic biliary ductal dilatation.

Pancreas: Unremarkable. No pancreatic ductal dilatation or
surrounding inflammatory changes.

Spleen: Normal in size without focal abnormality.

Adrenals/Urinary Tract: Stable 2.2 cm left adrenal adenoma. The
right adrenal gland is unremarkable. There is left renal lower pole
atrophy and scarring. The right kidney is unremarkable. There is no
hydronephrosis on either side. The visualized ureters and urinary
bladder appear unremarkable.

Stomach/Bowel: There is colonic diverticulosis without active
inflammatory changes. Moderate amount of stool noted throughout the
colon. There is no evidence of bowel obstruction or active
inflammation. The appendix is not visualized with certainty. No
inflammatory changes identified in the right lower quadrant.

Lymphatic: No adenopathy.

Reproductive: Hysterectomy.

Other: None

Musculoskeletal: Degenerate changes of the spine. No acute fracture.

Review of the MIP images confirms the above findings.
IMPRESSION: 1. Stable appearing type A aortic dissection status post prior
repair of the aortic root. Overall the appearance of the aorta and
dissection is similar to the prior study. No periaortic inflammation
or extravasation of contrast.
2. Small pulmonary embolism involving the subsegmental branch point
of the right lower lobe. This may represent an acute nonocclusive PE
versus chronic scarring. Correlation with clinical exam and history
of prior PE recommended.
3. Moderate cardiomegaly similar to prior CT.
4. Colonic diverticulosis without active inflammatory changes. No
bowel obstruction.
5. Trace free fluid within the pelvis of indeterminate etiology.
These results were called by telephone at the time of interpretation
on 03/08/2016 at [DATE] to Dr. MD YEASIN MIA MAGNAYE , who verbally
acknowledged these results.

## 2018-02-10 ENCOUNTER — Other Ambulatory Visit: Payer: Self-pay | Admitting: *Deleted

## 2018-02-10 DIAGNOSIS — I7103 Dissection of thoracoabdominal aorta: Secondary | ICD-10-CM

## 2018-03-09 ENCOUNTER — Other Ambulatory Visit: Payer: Self-pay

## 2018-03-09 ENCOUNTER — Emergency Department (HOSPITAL_COMMUNITY): Payer: Medicare Other

## 2018-03-09 ENCOUNTER — Emergency Department (HOSPITAL_COMMUNITY)
Admission: EM | Admit: 2018-03-09 | Discharge: 2018-03-09 | Disposition: A | Discharge status: Home / Self Care | Payer: Medicare Other | Attending: Emergency Medicine | Admitting: Emergency Medicine

## 2018-03-09 ENCOUNTER — Encounter (HOSPITAL_COMMUNITY): Payer: Self-pay | Admitting: *Deleted

## 2018-03-09 DIAGNOSIS — R079 Chest pain, unspecified: Secondary | ICD-10-CM | POA: Insufficient documentation

## 2018-03-09 DIAGNOSIS — Z5321 Procedure and treatment not carried out due to patient leaving prior to being seen by health care provider: Secondary | ICD-10-CM | POA: Insufficient documentation

## 2018-03-09 LAB — CBC
HCT: 41.8 % (ref 36.0–46.0)
Hemoglobin: 12.5 g/dL (ref 12.0–15.0)
MCH: 22.3 pg — ABNORMAL LOW (ref 26.0–34.0)
MCHC: 29.9 g/dL — ABNORMAL LOW (ref 30.0–36.0)
MCV: 74.6 fL — ABNORMAL LOW (ref 80.0–100.0)
Platelets: 236 10*3/uL (ref 150–400)
RBC: 5.6 MIL/uL — ABNORMAL HIGH (ref 3.87–5.11)
RDW: 15.1 % (ref 11.5–15.5)
WBC: 6.3 10*3/uL (ref 4.0–10.5)
nRBC: 0 % (ref 0.0–0.2)

## 2018-03-09 LAB — I-STAT TROPONIN, ED: Troponin i, poc: 0.01 ng/mL (ref 0.00–0.08)

## 2018-03-09 LAB — BASIC METABOLIC PANEL
Anion gap: 7 (ref 5–15)
BUN: 14 mg/dL (ref 8–23)
CO2: 25 mmol/L (ref 22–32)
Calcium: 9 mg/dL (ref 8.9–10.3)
Chloride: 106 mmol/L (ref 98–111)
Creatinine, Ser: 1.33 mg/dL — ABNORMAL HIGH (ref 0.44–1.00)
GFR calc Af Amer: 49 mL/min — ABNORMAL LOW (ref >60)
GFR calc non Af Amer: 42 mL/min — ABNORMAL LOW (ref >60)
Glucose, Bld: 96 mg/dL (ref 70–99)
Potassium: 4.3 mmol/L (ref 3.5–5.1)
Sodium: 138 mmol/L (ref 135–145)

## 2018-03-09 MED ORDER — SODIUM CHLORIDE 0.9% FLUSH: 3.0000 mL | Freq: Once | INTRAVENOUS | Status: DC

## 2018-03-09 NOTE — ED Triage Notes (Signed)
Pt states L sided chest pain since yesterday and r sided chest pain today.  Denies sob, nausea or dizziness.  States some blurred vision in both eyes starting at 1500.  Denies headache.  Neuro intact.

## 2018-03-10 ENCOUNTER — Encounter: Payer: Self-pay | Admitting: Cardiology

## 2018-03-10 ENCOUNTER — Ambulatory Visit (INDEPENDENT_AMBULATORY_CARE_PROVIDER_SITE_OTHER)
Admission: RE | Admit: 2018-03-10 | Discharge: 2018-03-10 | Disposition: A | Discharge status: Home / Self Care | Payer: Medicare Other | Source: Ambulatory Visit | Attending: Cardiology | Admitting: Cardiology

## 2018-03-10 ENCOUNTER — Telehealth: Payer: Self-pay

## 2018-03-10 ENCOUNTER — Ambulatory Visit (INDEPENDENT_AMBULATORY_CARE_PROVIDER_SITE_OTHER): Payer: Medicare Other | Admitting: Cardiology

## 2018-03-10 VITALS — BP 112/61 | HR 66 | Ht 63.0 in | Wt 204.2 lb

## 2018-03-10 DIAGNOSIS — I7101 Dissection of ascending aorta: Secondary | ICD-10-CM

## 2018-03-10 DIAGNOSIS — I420 Dilated cardiomyopathy: Secondary | ICD-10-CM

## 2018-03-10 DIAGNOSIS — I10 Essential (primary) hypertension: Secondary | ICD-10-CM

## 2018-03-10 DIAGNOSIS — I2510 Atherosclerotic heart disease of native coronary artery without angina pectoris: Secondary | ICD-10-CM | POA: Diagnosis not present

## 2018-03-10 DIAGNOSIS — R7989 Other specified abnormal findings of blood chemistry: Secondary | ICD-10-CM

## 2018-03-10 DIAGNOSIS — I2583 Coronary atherosclerosis due to lipid rich plaque: Secondary | ICD-10-CM

## 2018-03-10 DIAGNOSIS — R079 Chest pain, unspecified: Secondary | ICD-10-CM | POA: Diagnosis not present

## 2018-03-10 DIAGNOSIS — G4733 Obstructive sleep apnea (adult) (pediatric): Secondary | ICD-10-CM

## 2018-03-10 DIAGNOSIS — E785 Hyperlipidemia, unspecified: Secondary | ICD-10-CM

## 2018-03-10 DIAGNOSIS — Z9989 Dependence on other enabling machines and devices: Secondary | ICD-10-CM

## 2018-03-10 LAB — TROPONIN T: Troponin T TROPT: <0.011 ng/mL (ref <0.011)

## 2018-03-10 LAB — D-DIMER, QUANTITATIVE: D-DIMER: 3.33 mg/L FEU — ABNORMAL HIGH (ref 0.00–0.49)

## 2018-03-10 MED ORDER — IOPAMIDOL (ISOVUE-370) INJECTION 76%
100.0000 mL | Freq: Once | INTRAVENOUS | Status: AC | PRN
  Administered 2018-03-10: 100 mL via INTRAVENOUS

## 2018-03-10 NOTE — Progress Notes (Signed)
Cardiology Office Note:    Date:  03/10/2018   ID:  Alexis Wheeler, DOB 1955/03/23, MRN [id number]  PCP:  Nolene Ebbs, MD  Cardiologist:  No primary care provider on file.    Referring MD: Nolene Ebbs, MD   Chief Complaint  Patient presents with  . Coronary Artery Disease  . Hypertension  . Hyperlipidemia    History of Present Illness:    Alexis Wheeler is a 63 y.o. female with a hx of ASCAD with cath 2017 showing 30% Ost RCA stenosis, 20% mid-LAD stenosis, and normal LV function.  She also has a history of hypertension, morbid obesity and OSA on CPAP.    She was seen yesterday in the emergency room with left-sided chest pain as well as right-sided chest pain with no associated shortness of breath, nausea or diaphoresis.  She did have some blurred vision in her eyes.  Troponin was 0.01 and the rest of her labs were unremarkable except for a mildly elevated creatinine 1.33.  She was referred back to our office to be seen today.  Says she continues to have the chest discomfort.  She tells me that it sharp and initially started in the left side but now is on the right side.  It has been constant for 3 to 4 days without any improvement.  She is been taking ibuprofen with little effect.  She denies any associated shortness of breath, diaphoresis or nausea.  The pain does not radiate into her arms neck or back.  EKG was nonischemic in the ER.  Past Medical History:  Diagnosis Date  . Anemia    childhood  . Arthritis    right knee  . Ascending aortic dissection [facility])    s/p repair by Dr. Servando Snare  . Blood transfusion without reported diagnosis    with heart surgery-repair aorta  . CAD (coronary artery disease)    a. 04/2015: cath showed 30% Ost RCA stenosis, 20% mid-LAD stenosis, and normal LV function.   . Cataract    bilateral eyes, per pt.  . Congestive dilated cardiomyopathy ([place]) 08/01/2014  . GERD   . HEMATURIA UNSPECIFIED   . History of kidney stones   .  HYPERLIPIDEMIA   . HYPERTENSION   . Morbid obesity ([place])   . Myocardial infarction ([place])    01/30/2014  . MYOSITIS   . OSA on CPAP 05/10/2017   mild OSA with an AHI of 5.2/hr overall but severe during REM sleep with an AHI of 41.4/hr and underwent CPAP titration to 12cm H2O  . Sleep apnea    cpap-  doesnt use it- last study years ago  . Vertigo     Past Surgical History:  Procedure Laterality Date  . ABDOMINAL HYSTERECTOMY  1983  . CARDIAC CATHETERIZATION N/A 04/30/2015   Procedure: Left Heart Cath and Coronary Angiography;  Surgeon: Peter M Martinique, MD;  Location: [place] [facility];  Service: Cardiovascular;  Laterality: N/A;  . CHOLECYSTECTOMY  2001  . CYSTOSCOPY W/ RETROGRADES Left 08/02/2015   Procedure: CYSTOSCOPY WITH LEFT RETROGRADE PYELOGRAM;  Surgeon: Festus Aloe, MD;  [facility];  Service: Urology;  Laterality: Left;  . CYSTOSCOPY WITH URETEROSCOPY, STONE BASKETRY AND STENT PLACEMENT Left 08/02/2015   Procedure:  Mitzi Davenport ;  Surgeon: Festus Aloe, MD;  [facility];  Service: Urology;  Laterality: Left;  . CYSTOSCOPY/URETEROSCOPY/HOLMIUM LASER/STENT PLACEMENT Left 08/02/2015   Procedure: CYSTOSCOPY/URETEROSCOPY/HOLMIUM LASER/STENT PLACEMENT-LEFT;  Surgeon: Festus Aloe, MD;  [facility];  Service: Urology;  Laterality: Left;  . HOLMIUM LASER APPLICATION Left 6/37/8588   Procedure: HOLMIUM LASER APPLICATION;  Surgeon: Festus Aloe, MD;  [facility];  Service: Urology;  Laterality: Left;  . REPLACEMENT ASCENDING AORTA N/A 03/03/2014   Procedure: REPAIR OF ASCENDING AORTIC DISSECTION;  Surgeon: Grace Isaac, MD;  Location: [place];  Service: Open Heart Surgery;  Laterality: N/A;    Current Medications: Current Meds  Medication Sig  . amLODipine (NORVASC) 10 MG tablet TAKE 1 TABLET BY MOUTH ONCE DAILY  . aspirin EC 81 MG EC tablet Take 1 tablet (81 mg total) by mouth daily.  [name] atorvastatin (LIPITOR) 40 MG tablet TAKE 1 TABLET BY MOUTH  ONCE DAILY AT 6 PM  . carvedilol (COREG) 12.5 MG tablet TAKE 1 TABLET BY MOUTH TWICE DAILY  . Cholecalciferol 1000 units tablet Take 1,000 Units by mouth daily.  . colesevelam (WELCHOL) 625 MG tablet Take 1 tablet (625 mg total) by mouth 2 (two) times daily with a meal.  . colestipol (COLESTID) 1 g tablet Take 1 tablet (1 g total) by mouth 2 (two) times daily.  [name] dicyclomine (BENTYL) 20 MG tablet TAKE 1 TABLET BY MOUTH EVERY 8 HOURS  . doxazosin (CARDURA) 2 MG tablet Take 1 tablet (2 mg total) by mouth at bedtime.  . folic acid (FOLVITE) 1 MG tablet Take 1 mg by mouth daily.  [name] lisinopril (PRINIVIL,ZESTRIL) 40 MG tablet TAKE 1 TABLET BY MOUTH ONCE DAILY  . spironolactone (ALDACTONE) 50 MG tablet TAKE 1 TABLET BY MOUTH ONCE DAILY  . vitamin E 400 UNIT capsule Take 400 Units by mouth daily.     Allergies:   Hydralazine and Motrin [ibuprofen]   Social History   Socioeconomic History  . Marital status: Divorced    Spouse name: Not on file  . Number of children: 1  . Years of education: Not on file  . Highest education level: Not on file  Occupational History  . Not on file  Social Needs  . Financial resource strain: Not on file  . Food insecurity:    Worry: Not on file    Inability: Not on file  . Transportation needs:    Medical: Not on file    Non-medical: Not on file  Tobacco Use  . Smoking status: Never Smoker  . Smokeless tobacco: Never Used  . 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 Alcohol use: No  . Drug use: No  . Sexual activity: Never  Lifestyle  . Physical activity:    Days per week: Not on file    Minutes per session: Not on file  . Stress: Not on file  Relationships  . Social connections:    Talks on phone: Not on file    Gets together: Not on file    Attends religious service: Not on file    Active member of club or organization: Not on file     Attends meetings of clubs or organizations: Not on file    Relationship status: Not on file  Other Topics Concern  . Not on file  Social History Narrative  . Not on file     Family History: The patient's family history includes Aortic dissection (age of onset: 78) in her mother; Breast cancer in her sister; Breast cancer (age of onset: 67) in her mother; Colon cancer in her brother; Esophageal cancer in her brother; Hypertension  in her daughter, father, mother, and sister; Throat cancer in her brother. There is no history of Seizures, Liver cancer, Pancreatic cancer, Rectal cancer, or Stomach cancer.  ROS:   Please see the history of present illness.    ROS  All other systems reviewed and negative.   EKGs/Labs/Other Studies Reviewed:    The following studies were reviewed today: Labs from ER and EKG  EKG:  EKG is not ordered today.    Recent Labs: 03/09/2018: BUN 14; Creatinine, Ser 1.33; Hemoglobin 12.5; Platelets 236; Potassium 4.3; Sodium 138   Recent Lipid Panel    Component Value Date/Time   CHOL 104 02/15/2017 0927   TRIG 88 02/15/2017 0927   HDL 48 02/15/2017 0927   CHOLHDL 2.2 02/15/2017 0927   CHOLHDL 2.9 12/24/2015 1059   VLDL 27 12/24/2015 1059   LDLCALC 38 02/15/2017 0927    Physical Exam:    VS:  BP 112/61 (BP Location: Left Arm, Patient Position: Sitting, Cuff Size: Large)   Pulse 66   Ht 5\' 3"  (1.6 m)   Wt 204 lb 3.2 oz (92.6 kg)   SpO2 97%   BMI 36.17 kg/m     Wt Readings from Last 3 Encounters:  03/10/18 204 lb 3.2 oz (92.6 kg)  03/09/18 205 lb (93 kg)  08/10/17 204 lb (92.5 kg)     GEN:  Well nourished, well developed in no acute distress HEENT: Normal NECK: No JVD; No carotid bruits LYMPHATICS: No lymphadenopathy CARDIAC: RRR, no murmurs, rubs, gallops RESPIRATORY:  Clear to auscultation without rales, wheezing or rhonchi  ABDOMEN: Soft, non-tender, non-distended MUSCULOSKELETAL:  No edema; No deformity  SKIN: Warm and dry NEUROLOGIC:   Alert and oriented x 3 PSYCHIATRIC:  Normal affect   ASSESSMENT:    1. Chest pain, unspecified type   2. Coronary artery disease due to lipid rich plaque   3. Benign essential HTN   4. Dilated cardiomyopathy ([place])   5. OSA on CPAP   6. Hyperlipidemia with target LDL less than 70   7. Ascending aortic dissection ([facility])    PLAN:    In order of problems listed above:  1.  Chest pain - this is very atypical.  She describes it as sharp across both sides of her chest but not really worsened with movement although her chest wall is sore to palpation.  There is no associated shortness of breath, nausea or diaphoresis.  The pain is not worse with lying down versus sitting up.  She denies any significant shortness of breath.  She does have a history of aortic dissection in the past.  She is supposed to have a repeat chest and abdomen CT next month prior to seeing Dr. Servando Snare.  I am going to go ahead and order that CT today to make sure her prior dissection is stable.  I will repeat a stat troponin today as well as a d-dimer.  If these are normal then we will proceed with a scan Myoview to rule out ischemia.  I will also check a 2D echocardiogram to rule out pericarditis.  2.  ASCAD - nonobstructive by cardiac cath in 2017 showing 30% Ost RCA stenosis, 20% mid-LAD stenosis.  She will continue on aspirin 81 mg daily, beta-blocker and statin.  3.  Hypertension -BP is well controlled on exam today.  She will continue on amlodipine 10 mg daily, carvedilol 12.5 mg twice daily, doxazosin 2 mg nightly, lisinopril 40 mg daily and Spironolactone 50 mg daily.  Her creatinine  was slightly elevated from baseline at 1.33 on 03/09/2018.  4.  Dilated cardiomyopathy -resolved on echo 08/01/2014 with normal LV function.  5.  Hyperlipidemia with LDL goal less than 70 -her LDL was 38 by labs a year ago.  I will repeat an FLP and ALT.  She will continue on atorvastatin 40 mg daily and WelChol 625 mg twice daily.  6.   Ascending aortic dissection - post repair followed by Dr. Servando Snare.  She has an appointment for a follow-up CT of the chest and abdomen next month as well as a follow-up appoint with Dr. Servando Snare.  Given her chest pain I am going to go ahead and get the chest abdominal CT angio today to make sure her dissection is stable.  Continue on statin therapy.   Medication Adjustments/Labs and Tests Ordered: Current medicines are reviewed at length with the patient today.  Concerns regarding medicines are outlined above.  Orders Placed This Encounter  Procedures  . CT ANGIO CHEST AORTA W &/OR WO CONTRAST   No orders of the defined types were placed in this encounter.   Signed, Fransico Him, MD  03/10/2018 11:13 AM    [place]

## 2018-03-10 NOTE — Telephone Encounter (Signed)
-----   Message from Sueanne Margarita, MD sent at 03/10/2018  1:57 PM EST ----- Troponin normal but d-dimer elevated.  Please find out from radiology whether they can assess PE on chest CT the patient just had done

## 2018-03-10 NOTE — Patient Instructions (Signed)
Medication Instructions:  Your physician recommends that you continue on your current medications as directed. Please refer to the Current Medication list given to you today.  If you need a refill on your cardiac medications before your next appointment, please call your pharmacy.   Lab work: Today: STAT: D-Dimer and Troponin   If you have labs (blood work) drawn today and your tests are completely normal, you will receive your results only by: [name] MyChart Message (if you have MyChart) OR . A paper copy in the mail If you have any lab test that is abnormal or we need to change your treatment, we will call you to review the results.  Testing/Procedures: Non-Cardiac CT Angiography (CTA), is a special type of CT scan that uses a computer to produce multi-dimensional views of major blood vessels throughout the body. In CT angiography, a contrast material is injected through an IV to help visualize the blood vessels  Your physician has requested that you have a lexiscan myoview. For further information please visit [email or web address]. Please follow instruction sheet, as given.  Your physician has requested that you have an echocardiogram. Echocardiography is a painless test that uses sound waves to create images of your heart. It provides your doctor with information about the size and shape of your heart and how well your heart's chambers and valves are working. This procedure takes approximately one hour. There are no restrictions for this procedure.  Follow-Up: At [facility], you and your health needs are our priority.  As part of our continuing mission to provide you with exceptional heart care, we have created designated Provider Care Teams.  These Care Teams include your primary Cardiologist (physician) and Advanced Practice Providers (APPs -  Physician Assistants and Nurse Practitioners) who all work together to provide you with the care you need, when you need it. You will need a follow  up appointment in 1 years.  Please call our office 2 months in advance to schedule this appointment.  You may see Dr. Radford Pax or one of the following Advanced Practice Providers on your designated Care Team:   [place], PA-C Melina Copa, PA-C . Ermalinda Barrios, PA-C

## 2018-03-10 NOTE — Telephone Encounter (Signed)
Spoke with the patient, she expressed understanding about doing a lower extremity venous doppler. She had no further questions.

## 2018-03-14 ENCOUNTER — Other Ambulatory Visit: Payer: Medicare Other

## 2018-03-15 ENCOUNTER — Telehealth: Payer: Self-pay

## 2018-03-15 ENCOUNTER — Encounter: Payer: Self-pay | Admitting: Cardiology

## 2018-03-15 ENCOUNTER — Ambulatory Visit (HOSPITAL_COMMUNITY)
Admission: RE | Admit: 2018-03-15 | Discharge: 2018-03-15 | Disposition: A | Discharge status: Home / Self Care | Payer: Medicare Other | Source: Ambulatory Visit | Attending: Cardiology | Admitting: Cardiology

## 2018-03-15 ENCOUNTER — Ambulatory Visit (INDEPENDENT_AMBULATORY_CARE_PROVIDER_SITE_OTHER): Payer: Medicare Other | Admitting: Cardiology

## 2018-03-15 VITALS — BP 150/70 | HR 63 | Ht 63.0 in | Wt 204.0 lb

## 2018-03-15 DIAGNOSIS — I2510 Atherosclerotic heart disease of native coronary artery without angina pectoris: Secondary | ICD-10-CM

## 2018-03-15 DIAGNOSIS — I7101 Dissection of ascending aorta: Secondary | ICD-10-CM

## 2018-03-15 DIAGNOSIS — I824Y9 Acute embolism and thrombosis of unspecified deep veins of unspecified proximal lower extremity: Secondary | ICD-10-CM

## 2018-03-15 DIAGNOSIS — R7989 Other specified abnormal findings of blood chemistry: Secondary | ICD-10-CM | POA: Insufficient documentation

## 2018-03-15 DIAGNOSIS — I10 Essential (primary) hypertension: Secondary | ICD-10-CM

## 2018-03-15 MED ORDER — IOPAMIDOL (ISOVUE-370) INJECTION 76%
INTRAVENOUS | Status: AC
  Administered 2018-03-15: 16:00:00
  Filled 2018-03-15: qty 100

## 2018-03-15 NOTE — Patient Instructions (Addendum)
Medication Instructions:  NO CHANGE If you need a refill on your cardiac medications before your next appointment, please call your pharmacy.   Lab work: Your physician recommends that you return for lab work STAT PRIOR TO CT If you have labs (blood work) drawn today and your tests are completely normal, you will receive your results only by: [name] MyChart Message (if you have MyChart) OR . A paper copy in the mail If you have any lab test that is abnormal or we need to change your treatment, we will call you to review the results.  Testing/Procedures: CTA OF CHEST TO R/O PE=ARRIVE AT [place] AT 3 PM =ONLY LIQUIDS UNTIL THEN  Follow-Up: At [facility], you and your health needs are our priority.  As part of our continuing mission to provide you with exceptional heart care, we have created designated Provider Care Teams.  These Care Teams include your primary Cardiologist (physician) and Advanced Practice Providers (APPs -  Physician Assistants and Nurse Practitioners) who all work together to provide you with the care you need, when you need it. Your physician recommends that you schedule a follow-up appointment in: 1-2 WEEKS WITH DR Radford Pax

## 2018-03-15 NOTE — Telephone Encounter (Signed)
LMTCB

## 2018-03-15 NOTE — Progress Notes (Addendum)
HPI: Evaluate DVT.  Patient is a 63 year old female patient of Dr. Theodosia Blender with past medical history of mild coronary disease, hypertension, prior repair of a sending aortic dissection, obstructive sleep apnea, morbid obesity added to my schedule for DVT.  Patient was seen in the emergency room February 26 with complaints of chest pain.  Troponins were negative.  She saw Dr. Radford Pax back in follow-up on February 27.  CTA was ordered and performed on February 27 to evaluate prior aortic dissection repair.  The previous thoracic and abdominal aortic dissection repair was stable.  There was mild dilatation of the aortic root at 4.3 cm.  The left renal artery emanated from the false lumen and there was atrophy of the left kidney.  D-dimer was elevated at 3.33.  Therefore Dr. Radford Pax ordered lower extremity venous Dopplers.  These were performed today and showed DVT in the peroneal vein in the right lower extremity. Pt subsequently added to my schedule today. Patient denies dyspnea.  She has had vague aching pain in her chest intermittently for approximately 1 week.  It is not pleuritic, positional or exertional.  She has not traveled recently nor injured her legs.  She denies any pain in her lower extremities and there is no pedal edema.  Current Outpatient Medications  Medication Sig Dispense Refill  . amLODipine (NORVASC) 10 MG tablet TAKE 1 TABLET BY MOUTH ONCE DAILY 90 tablet 3  . aspirin EC 81 MG EC tablet Take 1 tablet (81 mg total) by mouth daily.    [name] atorvastatin (LIPITOR) 40 MG tablet TAKE 1 TABLET BY MOUTH ONCE DAILY AT 6 PM 90 tablet 3  . carvedilol (COREG) 12.5 MG tablet TAKE 1 TABLET BY MOUTH TWICE DAILY 180 tablet 3  . Cholecalciferol 1000 units tablet Take 1,000 Units by mouth daily.    . colesevelam (WELCHOL) 625 MG tablet Take 1 tablet (625 mg total) by mouth 2 (two) times daily with a meal. 30 tablet 2  . colestipol (COLESTID) 1 g tablet Take 1 tablet (1 g total) by mouth 2 (two)  times daily. 60 tablet 3  . dicyclomine (BENTYL) 20 MG tablet TAKE 1 TABLET BY MOUTH EVERY 8 HOURS 60 tablet 2  . doxazosin (CARDURA) 2 MG tablet Take 1 tablet (2 mg total) by mouth at bedtime. 30 tablet 11  . folic acid (FOLVITE) 1 MG tablet Take 1 mg by mouth daily.    [name] lisinopril (PRINIVIL,ZESTRIL) 40 MG tablet TAKE 1 TABLET BY MOUTH ONCE DAILY 90 tablet 3  . spironolactone (ALDACTONE) 50 MG tablet TAKE 1 TABLET BY MOUTH ONCE DAILY 90 tablet 2  . vitamin E 400 UNIT capsule Take 400 Units by mouth daily.     No current facility-administered medications for this visit.      Past Medical History:  Diagnosis Date  . Anemia    childhood  . Arthritis    right knee  . Ascending aortic dissection [facility])    s/p repair by Dr. Servando Snare  . Blood transfusion without reported diagnosis    with heart surgery-repair aorta  . CAD (coronary artery disease)    a. 04/2015: cath showed 30% Ost RCA stenosis, 20% mid-LAD stenosis, and normal LV function.   . Cataract    bilateral eyes, per pt.  . Congestive dilated cardiomyopathy ([place]) 08/01/2014  . GERD   . HEMATURIA UNSPECIFIED   . History of kidney stones   . HYPERLIPIDEMIA   . HYPERTENSION   . Morbid obesity (  [place])   . Myocardial infarction ([place])    01/30/2014  . MYOSITIS   . OSA on CPAP 05/10/2017   mild OSA with an AHI of 5.2/hr overall but severe during REM sleep with an AHI of 41.4/hr and underwent CPAP titration to 12cm H2O  . Sleep apnea    cpap-  doesnt use it- last study years ago  . Vertigo     Past Surgical History:  Procedure Laterality Date  . ABDOMINAL HYSTERECTOMY  1983  . CARDIAC CATHETERIZATION N/A 04/30/2015   Procedure: Left Heart Cath and Coronary Angiography;  Surgeon: Peter M Martinique, MD;  Location: [place] [facility];  Service: Cardiovascular;  Laterality: N/A;  . CHOLECYSTECTOMY  2001  . CYSTOSCOPY W/ RETROGRADES Left 08/02/2015   Procedure: CYSTOSCOPY WITH LEFT RETROGRADE PYELOGRAM;  Surgeon: Festus Aloe, MD;   [facility];  Service: Urology;  Laterality: Left;  . CYSTOSCOPY WITH URETEROSCOPY, STONE BASKETRY AND STENT PLACEMENT Left 08/02/2015   Procedure:  Mitzi Davenport ;  Surgeon: Festus Aloe, MD;  [facility];  Service: Urology;  Laterality: Left;  . CYSTOSCOPY/URETEROSCOPY/HOLMIUM LASER/STENT PLACEMENT Left 08/02/2015   Procedure: CYSTOSCOPY/URETEROSCOPY/HOLMIUM LASER/STENT PLACEMENT-LEFT;  Surgeon: Festus Aloe, MD;  [facility];  Service: Urology;  Laterality: Left;  . HOLMIUM LASER APPLICATION Left 7/61/6073   Procedure: HOLMIUM LASER APPLICATION;  Surgeon: Festus Aloe, MD;  [facility];  Service: Urology;  Laterality: Left;  . REPLACEMENT ASCENDING AORTA N/A 03/03/2014   Procedure: REPAIR OF ASCENDING AORTIC DISSECTION;  Surgeon: Grace Isaac, MD;  Location: [place];  Service: Open Heart Surgery;  Laterality: N/A;    Social History   Socioeconomic History  . Marital status: Divorced    Spouse name: Not on file  . Number of children: 1  . Years of education: Not on file  . Highest education level: Not on file  Occupational History  . Not on file  Social Needs  . Financial resource strain: Not on file  . Food insecurity:    Worry: Not on file    Inability: Not on file  . Transportation needs:    Medical: Not on file    Non-medical: Not on file  Tobacco Use  . Smoking status: Never Smoker  . Smokeless tobacco: Never Used  . 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 Alcohol use: No  . Drug use: No  . Sexual activity: Never  Lifestyle  . Physical activity:    Days per week: Not on file    Minutes per session: Not on file  . Stress: Not on file  Relationships  . Social connections:    Talks on phone: Not on file    Gets together: Not on file    Attends religious service: Not on file    Active member of club or organization: Not on file      Attends meetings of clubs or organizations: Not on file    Relationship status: Not on file  . Intimate partner violence:    Fear of current or ex partner: Not on file    Emotionally abused: Not on file    Physically abused: Not on file    Forced sexual activity: Not on file  Other Topics Concern  . Not on file  Social History Narrative  . Not on file    Family History  Problem Relation Age of Onset  .  Breast cancer Mother 62  . Aortic dissection Mother 36  . Hypertension Mother   . Throat cancer Brother        1 bro living  . Colon cancer Brother   . Esophageal cancer Brother   . Hypertension Father   . Breast cancer Sister   . Hypertension Sister   . Hypertension Daughter   . Seizures Neg Hx   . Liver cancer Neg Hx   . Pancreatic cancer Neg Hx   . Rectal cancer Neg Hx   . Stomach cancer Neg Hx     ROS: no fevers or chills, productive cough, hemoptysis, dysphasia, odynophagia, melena, hematochezia, dysuria, hematuria, rash, seizure activity, orthopnea, PND, pedal edema, claudication. Remaining systems are negative.  Physical Exam: Well-developed well-nourished in no acute distress.  Skin is warm and dry.  HEENT is normal.  Neck is supple.  Chest is clear to auscultation with normal expansion.  Cardiovascular exam is regular rate and rhythm.  Abdominal exam nontender or distended. No masses palpated. Extremities show no edema.  There is no tenderness to palpation in either lower extremity and no chords noted. neuro grossly intact   A/P  1 DVT-patient with DVT in right peroneal vein.  Her recent d-dimer was elevated.  I have reviewed her recent CTA (performed to evaluate her aortic dissection repair) and there does not appear to be large proximal pulmonary emboli.  However the study was not timed to rule out pulmonary embolus and distal pulmonary emboli cannot be excluded.  We will check her renal function.  If stable I will plan to repeat CTA to exclude pulmonary  embolus.  If negative we will not anticoagulate given the DVT is below her knee.  She is also asymptomatic with no lower extremity edema or calf pain.[name]  She would need to have repeat venous Dopplers in 2 weeks to exclude progression of her DVT.  If her CTA shows pulmonary embolus then she obviously would need to be anticoagulated.  I have discussed the patient with Dr. Radford Pax and she is in agreement.  We will arrange follow-up in the near future.  2 status post aortic dissection repair-recent CTA showed stable repair.  3 hypertension-blood pressure is controlled.  Continue present medications and follow.  4 history of nonobstructive coronary disease-continue aspirin and statin.  5 hyperlipidemia-continue statin.  Kirk Ruths, MD

## 2018-03-15 NOTE — Telephone Encounter (Signed)
-----   Message from Sueanne Margarita, MD sent at 03/15/2018  3:19 PM EST ----- LE dopplers showed mid right peroneal vein on right but nothing in the left.  There is a structure in the soleal muscle ? AVM.  Please have her followup with PCP later this week for further workup.  Dr. Stanford Breed saw her in office today and felt that since below the knee DVT in small vein no need for anticoagulation.  He is ordering repeat dedicated Chest CTA to rule out PE

## 2018-03-16 ENCOUNTER — Telehealth (HOSPITAL_COMMUNITY): Payer: Self-pay | Admitting: *Deleted

## 2018-03-16 NOTE — Telephone Encounter (Signed)
Patient given detailed instructions per Myocardial Perfusion Study Information Sheet for the test on 03/21/18. Patient notified to arrive 15 minutes early and that it is imperative to arrive on time for appointment to keep from having the test rescheduled.  If you need to cancel or reschedule your appointment, please call the office within 24 hours of your appointment. . Patient verbalized understanding. Kirstie Peri, RN

## 2018-03-21 ENCOUNTER — Ambulatory Visit (HOSPITAL_BASED_OUTPATIENT_CLINIC_OR_DEPARTMENT_OTHER): Payer: Medicare Other

## 2018-03-21 ENCOUNTER — Ambulatory Visit (HOSPITAL_COMMUNITY): Payer: Medicare Other | Attending: Cardiovascular Disease

## 2018-03-21 DIAGNOSIS — R079 Chest pain, unspecified: Secondary | ICD-10-CM | POA: Insufficient documentation

## 2018-03-21 LAB — ECHOCARDIOGRAM COMPLETE
Height: 63 in
Weight: 3264 oz

## 2018-03-21 MED ORDER — TECHNETIUM TC 99M TETROFOSMIN IV KIT
31.2000 | PACK | Freq: Once | INTRAVENOUS | Status: AC | PRN
  Administered 2018-03-21: 31.2 via INTRAVENOUS
  Filled 2018-03-21: qty 32

## 2018-03-21 MED ORDER — PERFLUTREN LIPID MICROSPHERE
1.0000 mL | INTRAVENOUS | Status: AC | PRN
  Administered 2018-03-21: 2 mL via INTRAVENOUS

## 2018-03-21 MED ORDER — REGADENOSON 0.4 MG/5ML IV SOLN
0.4000 mg | Freq: Once | INTRAVENOUS | Status: AC
  Administered 2018-03-21: 0.4 mg via INTRAVENOUS

## 2018-03-23 ENCOUNTER — Ambulatory Visit (HOSPITAL_COMMUNITY): Payer: Medicare Other | Attending: Cardiovascular Disease

## 2018-03-23 ENCOUNTER — Other Ambulatory Visit: Payer: Self-pay

## 2018-03-23 LAB — MYOCARDIAL PERFUSION IMAGING
LV dias vol: 76 mL (ref 46–106)
LV sys vol: 33 mL
Peak HR: 79 {beats}/min
Rest HR: 58 {beats}/min
SDS: 0
SRS: 0
SSS: 0
TID: 1.12

## 2018-03-23 MED ORDER — TECHNETIUM TC 99M TETROFOSMIN IV KIT
30.6000 | PACK | Freq: Once | INTRAVENOUS | Status: AC | PRN
  Administered 2018-03-23: 30.6 via INTRAVENOUS
  Filled 2018-03-23: qty 31

## 2018-03-24 ENCOUNTER — Ambulatory Visit (INDEPENDENT_AMBULATORY_CARE_PROVIDER_SITE_OTHER): Payer: Medicare Other | Admitting: Cardiothoracic Surgery

## 2018-03-24 ENCOUNTER — Other Ambulatory Visit: Payer: Medicare Other

## 2018-03-24 ENCOUNTER — Inpatient Hospital Stay: Admission: RE | Admit: 2018-03-24 | Payer: Medicare Other | Source: Ambulatory Visit

## 2018-03-24 ENCOUNTER — Other Ambulatory Visit: Payer: Self-pay

## 2018-03-24 VITALS — BP 124/64 | HR 67 | Resp 20 | Ht 63.0 in | Wt 204.0 lb

## 2018-03-24 DIAGNOSIS — I7103 Dissection of thoracoabdominal aorta: Secondary | ICD-10-CM

## 2018-03-24 NOTE — Progress Notes (Signed)
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 SURGICAL PROCEDURES: 1. Replacement of ascending aorta. 2. Resuspension of aortic valve  3. Right axillary artery cannulation and hypothermic circulatory circulatory arrest. SURGEON: Lanelle Bal, MD.  History of Present Illness:   Patient returns to the office today in follow-up after resuspension of her aortic valve and ascending aorta for type I aortic dissection done March 02, 2014.  Early postoperative she had significant problems with blood pressure control.  She notes that her blood pressures much better controlled now.   Her major complaint now is fatigue She denies any specific shortness of breath or chest discomfort but does fatigue with increased activity .   Recently she is seeing Dr. Radford Pax was found to have a superficial venous thrombosis in the right leg a stress test that has not been resulted yet, follow-up CTA of the abdomen and pelvis and also a CTA of the chest to rule out PE   Past Medical History:  Diagnosis Date  . Anemia    childhood  . Arthritis    right knee  . Ascending aortic dissection [facility])    s/p repair by Dr. Servando Snare  . Blood transfusion without reported diagnosis    with heart surgery-repair aorta  . CAD (coronary artery disease)    a. 04/2015: cath showed 30% Ost RCA stenosis, 20% mid-LAD stenosis, and normal LV function.   . Cataract    bilateral eyes, per pt.  . Congestive dilated cardiomyopathy ([place]) 08/01/2014  . GERD   . HEMATURIA  UNSPECIFIED   . History of kidney stones   . HYPERLIPIDEMIA   . HYPERTENSION   . Morbid obesity ([place])   . Myocardial infarction ([place])    01/30/2014  . MYOSITIS   . OSA on CPAP 05/10/2017   mild OSA with an AHI of 5.2/hr overall but severe during REM sleep with an AHI of 41.4/hr and underwent CPAP titration to 12cm H2O  . Sleep apnea    cpap-  doesnt use it- last study years ago  . 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 Hydralazine Nausea Only  . Motrin [Ibuprofen] Nausea Only    Upset GI (motrin brand causes the side effect)    Current Outpatient Medications  Medication Sig Dispense Refill  . amLODipine (NORVASC) 10 MG tablet TAKE 1 TABLET BY MOUTH ONCE DAILY 90 tablet 3  . aspirin EC 81 MG EC tablet Take 1 tablet (81 mg total) by mouth daily.    [name] atorvastatin (LIPITOR) 40 MG tablet TAKE 1 TABLET BY MOUTH ONCE DAILY AT 6 PM 90 tablet 3  . carvedilol (COREG) 12.5 MG tablet TAKE 1 TABLET BY  MOUTH TWICE DAILY 180 tablet 3  . Cholecalciferol 1000 units tablet Take 1,000 Units by mouth daily.    . colesevelam (WELCHOL) 625 MG tablet Take 1 tablet (625 mg total) by mouth 2 (two) times daily with a meal. 30 tablet 2  . colestipol (COLESTID) 1 g tablet Take 1 tablet (1 g total) by mouth 2 (two) times daily. 60 tablet 3  . dicyclomine (BENTYL) 20 MG tablet TAKE 1 TABLET BY MOUTH EVERY 8 HOURS 60 tablet 2  . doxazosin (CARDURA) 2 MG tablet Take 1 tablet (2 mg total) by mouth at bedtime. 30 tablet 11  . folic acid (FOLVITE) 1 MG tablet Take 1 mg by mouth daily.    [name] lisinopril (PRINIVIL,ZESTRIL) 40 MG tablet TAKE 1 TABLET BY MOUTH ONCE DAILY 90 tablet 3  . spironolactone (ALDACTONE) 50 MG tablet TAKE 1  TABLET BY MOUTH ONCE DAILY 90 tablet 2  . vitamin E 400 UNIT capsule Take 400 Units by mouth daily.     No current facility-administered medications for this visit.     Review of Systems  Constitutional: Positive for malaise/fatigue. Negative for chills, diaphoresis, fever and weight loss.  HENT: Negative.   Eyes: Negative.   Respiratory: Negative.   Cardiovascular: Positive for leg swelling. Negative for chest pain, palpitations, orthopnea, claudication and PND.  Gastrointestinal: Negative.   Genitourinary: Negative.   Musculoskeletal: Negative.   Skin: Negative.   Neurological: Negative.   Endo/Heme/Allergies: Negative.   Psychiatric/Behavioral: Negative.     Physical Exam: BP 124/64   Pulse 67   Resp 20   Ht 5\' 3"  (1.6 m)   Wt 204 lb (92.5 kg)   SpO2 97% Comment: RA  BMI 36.14 kg/m  General appearance: alert and cooperative Head: Normocephalic, without obvious abnormality, atraumatic Neck: no adenopathy, no carotid bruit, no JVD, supple, symmetrical, trachea midline and thyroid not enlarged, symmetric, no tenderness/mass/nodules Lymph nodes: Cervical, supraclavicular, and axillary nodes normal. Resp: clear to auscultation bilaterally Back: symmetric, no curvature. ROM normal. No CVA tenderness. Cardio: regular rate and rhythm, S1, S2 normal, no murmur, click, rub or gallop GI: soft, non-tender; bowel sounds normal; no masses,  no organomegaly Extremities: extremities normal, atraumatic, no cyanosis or edema and Homans sign is negative, no sign of DVT Neurologic: Grossly normal Sternum is stable and well-healed she has some keloid formation along the incision.  Diagnostic Studies & Laboratory data:     Recent Radiology Findings:   Dg Chest 2 View  Result Date: 03/09/2018 CLINICAL DATA:  Chest pain EXAM: CHEST - 2 VIEW COMPARISON:  07/09/2016 FINDINGS: The heart is normal in size status post median sternotomy. Both lungs are clear. The visualized skeletal structures  are unremarkable. IMPRESSION: No acute abnormality of the lungs.  No focal airspace opacity. Electronically Signed   By: Eddie Candle M.D.   On: 03/09/2018 16:57   Ct Angio Chest Pe W Or Wo Contrast  Result Date: 03/15/2018 CLINICAL DATA:  Lower extremity venous duplex ultrasound today demonstrated right peroneal vein DVT. Recent complaint of chest pain and history of prior aortic dissection with replacement of ascending thoracic aorta in 2016. Recent assessment the thoracic aorta by CTA on 03/10/2018. That study was not timed for pulmonary artery evaluation. EXAM: CT ANGIOGRAPHY CHEST WITH CONTRAST TECHNIQUE: Multidetector CT imaging of the chest was performed using the standard protocol during bolus administration of intravenous contrast. Multiplanar CT image reconstructions and MIPs were obtained to evaluate the vascular anatomy. CONTRAST:  70 mL ISOVUE-370 IV COMPARISON:  CTA of the chest on 03/10/2018 FINDINGS: Cardiovascular: The current study demonstrates excellent opacification of the right heart and pulmonary arteries. There is no evidence of pulmonary embolism. The heart size is stable and at the upper limits of normal. No evidence of pericardial fluid. The thoracic aorta is not opacified and therefore the residual dissection and replaced ascending thoracic aorta can not be assessed. Central pulmonary arteries are normal in caliber. Mediastinum/Nodes: No enlarged mediastinal, hilar, or axillary lymph nodes. Thyroid gland, trachea, and esophagus demonstrate no significant findings. Lungs/Pleura: There is no evidence of pulmonary edema, consolidation, pneumothorax, nodule or pleural fluid. Upper Abdomen: Differential density in the abdominal aorta with higher density in the anterior true lumen of the abdominal aorta at the level of chronic dissection. Musculoskeletal: No chest wall abnormality. No acute or significant osseous findings. Review of the MIP images confirms the above findings. IMPRESSION: No  evidence of pulmonary embolism or other acute findings in the chest. The current CTA was timed strictly for pulmonary arterial evaluation and is not adequate for aortic evaluation. The recent CTA on 03/10/2018 was timed for aortic evaluation. Electronically Signed   By: Aletta Edouard M.D.   On: 03/15/2018 16:37   Ct Angio Chest/abd/pel For Dissection W And/or W/wo  Result Date: 03/10/2018 CLINICAL DATA:  Chest pain.  History of aortic dissection. EXAM: CT ANGIOGRAPHY CHEST, ABDOMEN AND PELVIS TECHNIQUE: Multidetector CT imaging through the chest, abdomen and pelvis was performed using the standard protocol during bolus administration of intravenous contrast. Multiplanar reconstructed images and MIPs were obtained and reviewed to evaluate the vascular anatomy. CONTRAST:  176mL ISOVUE-370 IOPAMIDOL (ISOVUE-370) INJECTION 76% COMPARISON:  04/08/2017 FINDINGS: CTA CHEST FINDINGS Cardiovascular: Graft repair of the ascending aorta, beginning just above the valve to the proximal arch is stable in appearance. There is no evidence of pseudoaneurysm formation. Maximal diameter of the aortic root is 4.3 cm. This is not significantly changed. The dissection begins in the proximal aortic arch and extends into the abdominal aorta. There is no evidence of acute intramural hematoma. Stable extension of of the dissection into the innominate artery which terminates before the bifurcation. Left common carotid artery and left subclavian artery emanates from the true lumen. The true and false lumen within the aorta are patent. Right vertebral artery is patent. Left vertebral artery is obscured by venous collaterals. No evidence of significant coronary artery calcifications. Maximal diameter of the aorta in the arch and proximal descending thoracic aorta are 3.5 and 3.8 cm respectively. These measurements are not significantly changed. Mediastinum/Nodes: No abnormal mediastinal adenopathy or pericardial effusion. Unremarkable  esophagus. Multiple thyroid hypodensities are not significantly changed. Lungs/Pleura: No pneumothorax.  No pleural effusion.  Clear lungs. Musculoskeletal: No vertebral compression deformity. Review of the MIP images confirms the above findings. CTA ABDOMEN AND PELVIS FINDINGS VASCULAR Aorta: The aortic dissection continues throughout the abdominal aorta, terminating just above the bifurcation. This is stable. Maximal diameter of the abdominal aorta in the upper abdomen is 2.9 cm compared with 3.3 cm on the prior study. The true and false lumen both opacified with contrast. Mixing artifact in the false lumen is present. Celiac: Patent.  Emanating from the true lumen. SMA: Patent.  Emanating from the true lumen. Renals: Single right renal artery emanating from the true lumen is patent. Single left renal artery emanates from the false lumen and is patent. IMA: Patent.  Emanates from the true lumen. Inflow: Bilateral common, internal, and external iliac arteries are patent. There is no evidence of extension  of the dissection into the iliac arterial system. Mild atherosclerotic calcifications are noted. Review of the MIP images confirms the above findings. NON-VASCULAR Hepatobiliary: Diffuse hepatic steatosis.  Postcholecystectomy. Pancreas: Unremarkable. Spleen: Unremarkable Adrenals/Urinary Tract: There is poor parenchymal enhancement of the upper half of the left kidney on arterial phase imaging. This is associated with atrophy of the left kidney. These findings suggest an element of arterial insufficiency to the left kidney and these findings are not significantly changed. Right kidney and adrenal glands are within normal limits. 1.9 cm left adrenal nodule is stable. Bladder is within normal limits. Stomach/Bowel: Small and large bowel are decompressed. Small hiatal hernia is present. No obvious mass in the colon. Lymphatic: No abnormal retroperitoneal adenopathy. Reproductive: Uterus is absent.  Adnexa are within  normal limits. Other: No free fluid. Musculoskeletal: No vertebral compression deformity. Review of the MIP images confirms the above findings. IMPRESSION: Vascular: Stable appearance of the thoracic and abdominal aortic dissection after graft repair in the ascending aorta. Stable extension of the dissection into the innominate artery. Stable mild dilatation of the aortic root at 4.3 cm. Stable dilatation of the proximal thoracic aorta at 3.8 cm. Left renal artery emanates from the false lumen and there is arterial insufficiency and atrophy in the left kidney. Nonvascular: Stable left adrenal nodule supporting benign etiology. Electronically Signed   By: Marybelle Killings M.D.   On: 03/10/2018 13:18   Vas [place] Lower Extremity Venous (dvt)  Result Date: 03/15/2018  Lower Venous Study Other Indications: Elevated D-dimer and chest pain x 1 week. She c/o left knee                    pain and denies SOB. Anticoagulation: 81mg  Aspirin.  Performing Technologist: Sharlett Iles RVT  Examination Guidelines: A complete evaluation includes B-mode imaging, spectral Doppler, color Doppler, and power Doppler as needed of all accessible portions of each vessel. Bilateral testing is considered an integral part of a complete examination. Limited examinations for reoccurring indications may be performed as noted.  Right Venous Findings: +---------+---------------+---------+-----------+----------------+-------+          CompressibilityPhasicitySpontaneityProperties      Summary +---------+---------------+---------+-----------+----------------+-------+ CFV      Full           Yes      Yes                                +---------+---------------+---------+-----------+----------------+-------+ SFJ      Full           Yes      Yes                                +---------+---------------+---------+-----------+----------------+-------+ FV Prox  Full           Yes      Yes                                 +---------+---------------+---------+-----------+----------------+-------+ FV Mid   Full                                                       +---------+---------------+---------+-----------+----------------+-------+ FV DistalFull  Yes      Yes                                +---------+---------------+---------+-----------+----------------+-------+ PFV      Full           Yes      Yes                                +---------+---------------+---------+-----------+----------------+-------+ POP      Full           Yes      Yes                                +---------+---------------+---------+-----------+----------------+-------+ PTV      Full           Yes      Yes                                +---------+---------------+---------+-----------+----------------+-------+ PERO     Full           No       No         softly echogenicAcute   +---------+---------------+---------+-----------+----------------+-------+ Gastroc  Full                                                       +---------+---------------+---------+-----------+----------------+-------+ GSV      Full           Yes      Yes                                +---------+---------------+---------+-----------+----------------+-------+ Sluggish flow noted in the right CFV and proximal GSV at thigh level. Acute, occlusive DVT in one of the paired peroneal veins.  Left Venous Findings: +---------+---------------+---------+-----------+----------+-------+          CompressibilityPhasicitySpontaneityPropertiesSummary +---------+---------------+---------+-----------+----------+-------+ CFV      Full           Yes      Yes                          +---------+---------------+---------+-----------+----------+-------+ SFJ      Full           Yes      Yes                          +---------+---------------+---------+-----------+----------+-------+ FV Prox  Full           Yes      Yes                           +---------+---------------+---------+-----------+----------+-------+ FV Mid   Full                                                 +---------+---------------+---------+-----------+----------+-------+ FV  DistalFull           Yes      Yes                          +---------+---------------+---------+-----------+----------+-------+ PFV      Full           Yes      Yes                          +---------+---------------+---------+-----------+----------+-------+ POP      Full           Yes      Yes                          +---------+---------------+---------+-----------+----------+-------+ PTV      Full           Yes      Yes                          +---------+---------------+---------+-----------+----------+-------+ PERO     Full           Yes      Yes                          +---------+---------------+---------+-----------+----------+-------+ Gastroc  Full                                                 +---------+---------------+---------+-----------+----------+-------+ GSV      Full           Yes      Yes                          +---------+---------------+---------+-----------+----------+-------+ Sluggish flow in the left popliteal vein and proximal GSV at thigh level.  Summary: Right: Findings consistent with acute deep vein thrombosis involving the mid right peroneal vein.  Left: No evidence of deep vein thrombosis in the lower extremity. No indirect evidence of obstruction proximal to the inguinal ligament. There is a inhomogeneous structure with increase vascularity in the soleal muscle, cannot exclude AV Fistula. This inhomogeneous structure measures 1.0 x 68 x .86 cm.  *See table(s) above for measurements and observations. Electronically signed by Quay Burow MD on 03/15/2018 at 4:42:37 PM.    Final    Ct Angio Chest Aorta W/cm &/or Wo/cm  Addendum Date: 04/08/2017   ADDENDUM REPORT: 04/08/2017 11:29 ADDENDUM: Study was  compared from a CTA on 07/09/2016. The size of the descending thoracic aorta has not significantly changed since 07/09/2016. Atrophy in the left kidney upper pole is also similar to the exam on 07/09/2016. Electronically Signed   By: Markus Daft M.D.   On: 04/08/2017 11:29   Result Date: 04/08/2017 CLINICAL DATA:  63 year old with history of a type 1 aortic dissection and repair of the ascending thoracic aorta. Follow-up aortic dissection. EXAM: CT ANGIOGRAPHY CHEST, ABDOMEN AND PELVIS TECHNIQUE: Multidetector CT imaging through the chest, abdomen and pelvis was performed using the standard protocol during bolus administration of intravenous contrast. Multiplanar reconstructed images and MIPs were obtained and reviewed to evaluate the vascular anatomy. CONTRAST:  58mL ISOVUE-370 IOPAMIDOL (ISOVUE-370) INJECTION 76% COMPARISON:  02/21/2015 FINDINGS: CTA  CHEST FINDINGS Cardiovascular: Replacement of the ascending thoracic aorta. The aortic graft is patent. There is dilatation of the native aortic root measuring roughly 4.2 cm at the sinuses of Valsalva and minimally changed from the previous examination. Again noted is aortic dissection just beyond the ascending aortic graft. Dissection extends into the proximal right brachiocephalic artery and terminates proximal to the right subclavian artery. Great vessels are patent. Typical arch configuration. The left common carotid artery and left subclavian artery appear to be arising from the true lumen. True lumen is along the lateral aspect of the descending thoracic aorta. Dissection extends throughout the descending thoracic aorta into the abdominal aorta. Distal aortic arch measures roughly 3.3 cm in diameter and stable. Proximal descending thoracic aorta measures 3.8 cm and previously measured 3.6 cm. Mid descending thoracic aorta measures 3.1 cm previously measured 3.0 cm. The aorta at the hiatus measures 3.2 cm and previously measured 3.0 cm. Overall, minimal  enlargement of the descending thoracic aorta. Contrast was injected through the left arm. There is filling of multiple small paraspinal veins around the upper thoracic spine. SVC and central veins are patent. Mediastinum/Nodes: Thyroid tissue is mildly heterogeneous. No lymph node enlargement in the mediastinum or hila. No axillary lymph node enlargement. Lungs/Pleura: Trachea and mainstem bronchi are patent. No large pleural effusions. Lungs are clear without airspace disease or consolidation. Musculoskeletal: Median sternotomy wires. No acute bone abnormality. Review of the MIP images confirms the above findings. CTA ABDOMEN AND PELVIS FINDINGS VASCULAR Aorta: Aortic dissection extends throughout the abdominal aorta and terminates just above the bifurcation. Extent of the dissection is unchanged. Aorta at the level of the celiac trunk measures 3.3 cm and previously measured 2.9 cm. Infrarenal abdominal aorta measures 2.2 cm and previously measured 2.1 cm. Incomplete contrast opacification of the false lumen. Celiac: Celiac trunk originates from the true lumen. Celiac trunk is widely patent without significant atherosclerotic disease or stenosis. SMA: SMA region originates from the true lumen. SMA is patent without significant atherosclerotic disease or stenosis. Renals: Right renal artery originates from the true lumen without significant stenosis or plaque. The main left renal artery originates from the false lumen and there is very little contrast in the main left renal artery but this could be related to the timing of the study. Accessory left renal artery originate near the dissection flap and there may be flow from the true lumen. IMA: IMA originates from the true lumen. IMA is patent without significant stenosis or plaque. Inflow: Dissection does not extend into the iliac arteries. The iliac arteries are patent bilaterally without stenosis or dilatation. Mild atherosclerotic disease in the common iliac  arteries. Proximal femoral arteries are patent bilaterally. Veins: No obvious venous abnormality within the limitations of this arterial phase study. Review of the MIP images confirms the above findings. NON-VASCULAR Hepatobiliary: Gallbladder appears to be surgically absent. No gross abnormality to the liver on this arterial phase of imaging. Pancreas: Normal appearance of the pancreas without inflammation or duct dilatation. Spleen: Normal appearance of spleen without enlargement. Adrenals/Urinary Tract: Stable low-density nodule in left adrenal gland that measures 10 Hounsfield units on the noncontrast images. Nodule measures roughly 2.3 cm and most compatible with a benign adenoma. Right adrenal gland is normal. Normal perfusion to the right kidney without hydronephrosis. No suspicious renal lesions. Delayed filling of the left kidney upper pole related to the flow from the false lumen. In addition, there has been interval atrophy of the left upper pole since the prior examination. Previously,  there was a large stone in the left renal pelvis which is no longer present. Negative for left hydronephrosis. Urinary bladder is unremarkable. Stomach/Bowel: Evidence for small hiatal hernia. Normal appearance of the small bowel without obstruction. Few colonic diverticula without acute inflammatory changes. Lymphatic: No lymph node enlargement in the abdomen or pelvis. Reproductive: Uterus has been removed. Evidence for normal ovarian tissue on sequence 22, image 226 and 234. This ovarian tissue appears stable. Other: Trace fluid in the dependent aspect of the pelvis is similar to the previous examination. No free air. Musculoskeletal: No acute bone abnormality. Review of the MIP images confirms the above findings. IMPRESSION: Stable appearance of the surgically repaired ascending thoracic aorta. Stable extent and configuration of the aortic dissection extending from the thoracic aortic graft to the distal abdominal  aorta. Mild enlargement of the descending thoracic aorta and abdominal aorta. Stable enlargement of the native aortic root measuring roughly 4.2 cm. Recommend attention on follow up imaging. Increased atrophy of the left kidney upper pole secondary to flow from the false lumen. No acute abnormality in the chest, abdomen or pelvis. Electronically Signed: By: Markus Daft M.D. On: 04/08/2017 10:44   Ct Angio Abd/pel W/ And/or W/o  Addendum Date: 04/08/2017   ADDENDUM REPORT: 04/08/2017 11:29 ADDENDUM: Study was compared from a CTA on 07/09/2016. The size of the descending thoracic aorta has not significantly changed since 07/09/2016. Atrophy in the left kidney upper pole is also similar to the exam on 07/09/2016. Electronically Signed   By: Markus Daft M.D.   On: 04/08/2017 11:29   Result Date: 04/08/2017 CLINICAL DATA:  63 year old with history of a type 1 aortic dissection and repair of the ascending thoracic aorta. Follow-up aortic dissection. EXAM: CT ANGIOGRAPHY CHEST, ABDOMEN AND PELVIS TECHNIQUE: Multidetector CT imaging through the chest, abdomen and pelvis was performed using the standard protocol during bolus administration of intravenous contrast. Multiplanar reconstructed images and MIPs were obtained and reviewed to evaluate the vascular anatomy. CONTRAST:  47mL ISOVUE-370 IOPAMIDOL (ISOVUE-370) INJECTION 76% COMPARISON:  02/21/2015 FINDINGS: CTA CHEST FINDINGS Cardiovascular: Replacement of the ascending thoracic aorta. The aortic graft is patent. There is dilatation of the native aortic root measuring roughly 4.2 cm at the sinuses of Valsalva and minimally changed from the previous examination. Again noted is aortic dissection just beyond the ascending aortic graft. Dissection extends into the proximal right brachiocephalic artery and terminates proximal to the right subclavian artery. Great vessels are patent. Typical arch configuration. The left common carotid artery and left subclavian artery  appear to be arising from the true lumen. True lumen is along the lateral aspect of the descending thoracic aorta. Dissection extends throughout the descending thoracic aorta into the abdominal aorta. Distal aortic arch measures roughly 3.3 cm in diameter and stable. Proximal descending thoracic aorta measures 3.8 cm and previously measured 3.6 cm. Mid descending thoracic aorta measures 3.1 cm previously measured 3.0 cm. The aorta at the hiatus measures 3.2 cm and previously measured 3.0 cm. Overall, minimal enlargement of the descending thoracic aorta. Contrast was injected through the left arm. There is filling of multiple small paraspinal veins around the upper thoracic spine. SVC and central veins are patent. Mediastinum/Nodes: Thyroid tissue is mildly heterogeneous. No lymph node enlargement in the mediastinum or hila. No axillary lymph node enlargement. Lungs/Pleura: Trachea and mainstem bronchi are patent. No large pleural effusions. Lungs are clear without airspace disease or consolidation. Musculoskeletal: Median sternotomy wires. No acute bone abnormality. Review of the MIP images confirms the  above findings. CTA ABDOMEN AND PELVIS FINDINGS VASCULAR Aorta: Aortic dissection extends throughout the abdominal aorta and terminates just above the bifurcation. Extent of the dissection is unchanged. Aorta at the level of the celiac trunk measures 3.3 cm and previously measured 2.9 cm. Infrarenal abdominal aorta measures 2.2 cm and previously measured 2.1 cm. Incomplete contrast opacification of the false lumen. Celiac: Celiac trunk originates from the true lumen. Celiac trunk is widely patent without significant atherosclerotic disease or stenosis. SMA: SMA region originates from the true lumen. SMA is patent without significant atherosclerotic disease or stenosis. Renals: Right renal artery originates from the true lumen without significant stenosis or plaque. The main left renal artery originates from the false  lumen and there is very little contrast in the main left renal artery but this could be related to the timing of the study. Accessory left renal artery originate near the dissection flap and there may be flow from the true lumen. IMA: IMA originates from the true lumen. IMA is patent without significant stenosis or plaque. Inflow: Dissection does not extend into the iliac arteries. The iliac arteries are patent bilaterally without stenosis or dilatation. Mild atherosclerotic disease in the common iliac arteries. Proximal femoral arteries are patent bilaterally. Veins: No obvious venous abnormality within the limitations of this arterial phase study. Review of the MIP images confirms the above findings. NON-VASCULAR Hepatobiliary: Gallbladder appears to be surgically absent. No gross abnormality to the liver on this arterial phase of imaging. Pancreas: Normal appearance of the pancreas without inflammation or duct dilatation. Spleen: Normal appearance of spleen without enlargement. Adrenals/Urinary Tract: Stable low-density nodule in left adrenal gland that measures 10 Hounsfield units on the noncontrast images. Nodule measures roughly 2.3 cm and most compatible with a benign adenoma. Right adrenal gland is normal. Normal perfusion to the right kidney without hydronephrosis. No suspicious renal lesions. Delayed filling of the left kidney upper pole related to the flow from the false lumen. In addition, there has been interval atrophy of the left upper pole since the prior examination. Previously, there was a large stone in the left renal pelvis which is no longer present. Negative for left hydronephrosis. Urinary bladder is unremarkable. Stomach/Bowel: Evidence for small hiatal hernia. Normal appearance of the small bowel without obstruction. Few colonic diverticula without acute inflammatory changes. Lymphatic: No lymph node enlargement in the abdomen or pelvis. Reproductive: Uterus has been removed. Evidence for  normal ovarian tissue on sequence 22, image 226 and 234. This ovarian tissue appears stable. Other: Trace fluid in the dependent aspect of the pelvis is similar to the previous examination. No free air. Musculoskeletal: No acute bone abnormality. Review of the MIP images confirms the above findings. IMPRESSION: Stable appearance of the surgically repaired ascending thoracic aorta. Stable extent and configuration of the aortic dissection extending from the thoracic aortic graft to the distal abdominal aorta. Mild enlargement of the descending thoracic aorta and abdominal aorta. Stable enlargement of the native aortic root measuring roughly 4.2 cm. Recommend attention on follow up imaging. Increased atrophy of the left kidney upper pole secondary to flow from the false lumen. No acute abnormality in the chest, abdomen or pelvis. Electronically Signed: By: Markus Daft M.D. On: 04/08/2017 10:44    Ct Angio Chest/abd/pel For Dissection W And/or W/wo  Result Date: 03/08/2016 CLINICAL DATA:  63 year old female with chest tightness. History of type 1 aortic dissection repair. EXAM: CT ANGIOGRAPHY CHEST, ABDOMEN AND PELVIS TECHNIQUE: Multidetector CT imaging through the chest, abdomen and pelvis was  performed using the standard protocol during bolus administration of intravenous contrast. Multiplanar reconstructed images and MIPs were obtained and reviewed to evaluate the vascular anatomy. CONTRAST:  80 cc Isovue 370 COMPARISON:  Chest radiograph dated 03/07/2016 and CT dated 02/21/2015 FINDINGS: CTA CHEST FINDINGS Cardiovascular: There is a stable moderate cardiomegaly. No pericardial effusion. Type A aortic dissection again noted with postsurgical changes of repair of the root of the aorta. The ascending aortic graft appears unremarkable and similar to the prior radiograph. There is no extravasation of contrast. There is contrast in both true and false lumen. There is extension of the dissection flap into the proximal  portion of the right innominate artery. This findings are similar to prior CT. The origins of the great vessels of the aortic arch appear to arise from the true lumen. The central pulmonary arteries appear unremarkable and patent. There is however a small pulmonary embolus in the subsegmental branch point of the right lower lobe (series 601 image 83 and coronal series 602, image 105). This clot appears nonocclusive and may be chronic in nature and represent scarring. Correlation with clinical exam and history of prior TS recommended. Mediastinum/Nodes: There is no hilar or mediastinal adenopathy. The esophagus is grossly unremarkable. Lungs/Pleura: The lungs are clear. There is no pleural effusion or pneumothorax. The central airways are patent. Musculoskeletal: Median sternotomy wires. No acute osseous pathology. Review of the MIP images confirms the above findings. CTA ABDOMEN AND PELVIS FINDINGS VASCULAR Aorta: Type A dissection extends just above the aortic bifurcation. There is contrast in the false lumen although appears less dense compared to the contrast within the true lumen. Celiac: The origin of the celiac axis arises from the true lumen and appears patent. SMA: The origin of the SMA appears patent and arises from the true lumen. Renals: The right renal artery appears patent and arises from the true lumen. The left renal artery is patent as well. The origin of the left renal artery is not seen with certainty. IMA: The IMA is patent and arises from the true lumen. Inflow: Mild atherosclerotic calcification of the common iliac arteries. The common iliac external and internal iliac arteries are patent. Veins: No obvious venous abnormality within the limitations of this arterial phase study. Review of the MIP images confirms the above findings. No intra-abdominal free air.  Trace fluid within the pelvis NON-VASCULAR Hepatobiliary: Cholecystectomy. The liver is unremarkable. No intrahepatic biliary ductal  dilatation. Pancreas: Unremarkable. No pancreatic ductal dilatation or surrounding inflammatory changes. Spleen: Normal in size without focal abnormality. Adrenals/Urinary Tract: Stable 2.2 cm left adrenal adenoma. The right adrenal gland is unremarkable. There is left renal lower pole atrophy and scarring. The right kidney is unremarkable. There is no hydronephrosis on either side. The visualized ureters and urinary bladder appear unremarkable. Stomach/Bowel: There is colonic diverticulosis without active inflammatory changes. Moderate amount of stool noted throughout the colon. There is no evidence of bowel obstruction or active inflammation. The appendix is not visualized with certainty. No inflammatory changes identified in the right lower quadrant. Lymphatic: No adenopathy. Reproductive: Hysterectomy. Other: None Musculoskeletal: Degenerate changes of the spine. No acute fracture. Review of the MIP images confirms the above findings. IMPRESSION: 1. Stable appearing type A aortic dissection status post prior repair of the aortic root. Overall the appearance of the aorta and dissection is similar to the prior study. No periaortic inflammation or extravasation of contrast. 2. Small pulmonary embolism involving the subsegmental branch point of the right lower lobe. This may represent an  acute nonocclusive PE versus chronic scarring. Correlation with clinical exam and history of prior PE recommended. 3. Moderate cardiomegaly similar to prior CT. 4. Colonic diverticulosis without active inflammatory changes. No bowel obstruction. 5. Trace free fluid within the pelvis of indeterminate etiology. These results were called by telephone at the time of interpretation on 03/08/2016 at 3:01 am to Dr. Tennis Must , who verbally acknowledged these results. Electronically Signed   By: Anner Crete M.D.   On: 03/08/2016 03:08    Ct Angio Chest Aorta W/cm &/or Wo/cm  02/21/2015  CLINICAL DATA:  History of aortic dissection  with prior surgeon EXAM: CT ANGIOGRAPHY CHEST CT ABDOMEN AND PELVIS WITH CONTRAST TECHNIQUE: Multidetector CT imaging of the chest was performed using the standard protocol during bolus administration of intravenous contrast. Multiplanar CT image reconstructions and MIPs were obtained to evaluate the vascular anatomy. Multidetector CT imaging of the abdomen and pelvis was performed using the standard protocol during bolus administration of intravenous contrast. CONTRAST:  75 mL Isovue 370 COMPARISON:  None. FINDINGS: CTA CHEST FINDINGS There are changes consistent with the patient's known history of type 1 aortic dissection with interval surgical repair of the ascending aorta. The ascending tube graft appears well placed without extravasation. Flow into both the false and true lumens is noted. The dissection flap extends into the proximal portion of the innominate artery on the right similar that seen on the prior exam. The false lumen again supplies flow to the left common carotid artery and left subclavian artery. The lungs are well aerated bilaterally. No sizable hilar or mediastinal adenopathy is noted. Bilateral thyroid nodules are again noted stable. No bony abnormality is noted. CT ABDOMEN and PELVIS FINDINGS The liver demonstrates some diffuse decreased attenuation consistent with fatty infiltration. The gallbladder has been surgically removed. The spleen, pancreas and right adrenal gland are within normal limits. A hypodense lesion is noted arising from the left adrenal gland which is stable in appearance likely representing an adenoma. The right kidney demonstrates a normal enhancement pattern. The left kidney demonstrates decreased enhancement similar to that seen on the prior exam. The dominant left renal artery is noted arising from the true lumen and occluding just beyond its origin. An accessory left renal artery is noted arising from the false lumen and remains patent. Relative sparing of the lower  pole of the left kidney is noted. A large left renal pelvis stone as well as a smaller lower pole stone are again identified and stable. The bladder is partially distended. No pelvic mass lesion is noted. Scattered diverticular change of the colon is noted. No acute bony abnormality is noted. Vascular: The false lumen of the dissection again supplies the celiac axis, superior mesenteric artery, right renal artery and inferior mesenteric artery. Flow to these vessels is widely patent. The dissection flap extends to just above the aortic bifurcation. Some fenestrations in the distal flap are noted. Flow via the false lumen into the iliac arteries is noted bilaterally. The previously seen extension of the dissection into the left common iliac artery is no longer identified. Review of the MIP images confirms the above findings. 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 The tube graft is widely patent with flow into both the true and false lumens. The dissection flap terminates just above the aortic bifurcation with improved flow into the left iliac artery when compared with the prior exam. No new focal dissection or abnormality is noted. Visceral flow is similar  to that noted on the prior exam. Left renal calculi without obstructive change. 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 No acute abnormality is noted. Electronically Signed   By: Inez Catalina M.D.   On: 02/21/2015 13:39      Recent Lab Findings: Lab Results  Component Value Date   WBC 6.3 03/09/2018   HGB 12.5 03/09/2018   HCT 41.8 03/09/2018   PLT 236 03/09/2018   GLUCOSE 96 03/09/2018   CHOL 104 02/15/2017   TRIG 88 02/15/2017   HDL 48 02/15/2017   LDLCALC 38 02/15/2017   ALT 13 02/15/2017   AST 11 02/15/2017   NA 138 03/09/2018   K 4.3 03/09/2018   CL 106  03/09/2018   CREATININE 1.33 (H) 03/09/2018   BUN 14 03/09/2018   CO2 25 03/09/2018   TSH 0.968 08/31/2016   INR 0.94 04/26/2015   HGBA1C 6.0 (H) 08/31/2016   Result status: Final result    ECHOCARDIOGRAM REPORT       Patient Name:   Alexis Wheeler Date of Exam: 03/21/2018 Medical Rec #:  [id number]           Height:       63.0 in Accession #:    [id number]          Weight:       204.0 lb Date of Birth:  02/06/1955            BSA:          1.95 m Patient Age:    20 years            BP:           120/62 mmHg Patient Gender: F                   HR:           60 bpm. Exam Location:  Outpatient    Procedure: 2D Echo, Cardiac Doppler, Color Doppler and Intracardiac            Opacification Agent  Indications:    R07.9 Chest pain.   History:        Patient has prior history of Echocardiogram examinations, most                 recent 08/03/2014. Previous Myocardial Infarction; Risk Factors:                 Hypertension and Dyslipidemia. Mild coronary artery disease.                 Prior repair of ascending aortic dissection. Obstructive sleep                 apnea. Obesity.   Sonographer:    Diamond Nickel RCS Referring Phys: [place]    1. The left ventricle has normal systolic function with an ejection fraction of 60-65%. The cavity size was normal. There is mildly increased left ventricular wall thickness. Left ventricular diastolic Doppler parameters are consistent with impaired  relaxation.  2. The right ventricle has normal systolic function. The cavity was normal. There is no increase in right ventricular wall thickness.  3. The tricuspid valve is grossly normal.  4. The aortic valve is tricuspid Mild thickening of the aortic valve Aortic valve regurgitation is mild by color flow  Doppler.  5. Definity used; normal LV systolic function; mild diastolic dysfunction; mild LVH; mildly dilated aortic root; sclerotic aortic valve with mild AI.  6.  The interatrial septum is aneurysmal.  FINDINGS  Left Ventricle: The left ventricle has normal systolic function, with an ejection fraction of 60-65%. The cavity size was normal. There is mildly increased left ventricular wall thickness. Left ventricular diastolic Doppler parameters are consistent  with impaired relaxation. Definity contrast agent was given IV to delineate the left ventricular endocardial borders. Right Ventricle: The right ventricle has normal systolic function. The cavity was normal. There is no increase in right ventricular wall thickness. Left Atrium: left atrial size was normal in size Right Atrium: right atrial size was normal in size. Interatrial Septum: No atrial level shunt detected by color flow Doppler. An The interatrial septum is aneurysmal is seen. Pericardium: There is no evidence of pericardial effusion. Mitral Valve: The mitral valve is normal in structure. Mitral valve regurgitation is not visualized by color flow Doppler. Tricuspid Valve: The tricuspid valve is grossly normal. Tricuspid valve regurgitation is trivial by color flow Doppler. Aortic Valve: The aortic valve is tricuspid Mild thickening of the aortic valve. Aortic valve regurgitation is mild by color flow Doppler. Pulmonic Valve: The pulmonic valve was grossly normal. Pulmonic valve regurgitation is trivial by color flow Doppler.   LEFT VENTRICLE PLAX 2D (Teich) LV EF:          78.8 %   Diastology LVIDd:          4.27 cm  LV e' lateral:   12.90 cm/s LVIDs:          2.26 cm  LV E/e' lateral: 4.8 LV PW:          1.12 cm  LV e' medial:    8.16 cm/s LV IVS:         1.36 cm  LV E/e' medial:  7.6 LVOT diam:      2.00 cm LV SV:          64 ml LVOT Area:      3.14 cm  RIGHT VENTRICLE RV S prime:     7.51 cm/s TAPSE (M-mode): 1.3 cm  LEFT ATRIUM             Index       RIGHT ATRIUM           Index LA diam:        3.60 cm 1.85 cm/m  RA Area:     16.90 cm LA Vol (A2C):   75.8 ml 38.87 ml/m  RA Volume:   39.80 ml  20.41 ml/m LA Vol (A4C):   48.6 ml 24.92 ml/m LA Biplane Vol: 61.0 ml 31.28 ml/m  AORTIC VALVE LVOT Vmax:   113.00 cm/s LVOT Vmean:  69.900 cm/s LVOT VTI:    0.270 m AR PHT:      411 msec   AORTA Ao Root diam: 4.04 cm  MITRAL VALVE MV Area (PHT): cm MV PHT         msec MV Decel Time: 243 msec MV E velocity: 62.10 cm/s MV A velocity: 86.80 cm/s MV E/A ratio:  0.72    Kirk Ruths MD Electronically signed by Kirk Ruths MD Signature Date/Time: 03/21/2018/3:27:13 PM       Assessment / Plan:   Patient is status post replacement of a sending aorta and resuspension of her aortic valve for type I aortic dissection in 2016.  CTA of the chest shows stable  surgical repair of the ascending aorta with persistent dissection flap extending into the distal abdominal aortia but without evidence of progressive enlargement.   Echocardiogram done recently shows functioning aortic valve without insufficiency     Plan to see her back in 18 months  with a follow-up CTA of the chest and abdomen  Grace Isaac MD      [address] [place] [address] Office [phone]   Beeper [phone]  03/24/2018 1:19 PM

## 2018-03-25 ENCOUNTER — Telehealth: Payer: Self-pay

## 2018-03-25 DIAGNOSIS — I7101 Dissection of ascending aorta: Secondary | ICD-10-CM

## 2018-03-25 DIAGNOSIS — Z9889 Other specified postprocedural states: Secondary | ICD-10-CM

## 2018-03-25 NOTE — Telephone Encounter (Signed)
-----   Message from Sueanne Margarita, MD sent at 03/21/2018  3:31 PM EDT ----- Please let patient know that echo showed normal LV function with increased deafness of the heart muscle mild thickening of the aortic valve with mild leakiness of the aortic valve, mildly dilated aortic root 4 cm.  Please repeat 2D echo in 1 year.

## 2018-03-25 NOTE — Telephone Encounter (Signed)
Spoke with the patient, she expressed understanding about her results. She accepted the repeat echo in 1 year. She had no further questions.

## 2018-03-29 ENCOUNTER — Telehealth: Payer: Self-pay

## 2018-03-29 DIAGNOSIS — I824Y9 Acute embolism and thrombosis of unspecified deep veins of unspecified proximal lower extremity: Secondary | ICD-10-CM

## 2018-03-29 NOTE — Telephone Encounter (Signed)
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

## 2018-03-29 NOTE — Telephone Encounter (Signed)
Spoke with the patient, she agreed to having a repeat doppler. Attempted to schedule, vascular had left for the day. A message sent to see availability for tomorrow.

## 2018-03-29 NOTE — Telephone Encounter (Signed)
LMTCB

## 2018-03-29 NOTE — Telephone Encounter (Signed)
-----   Message from Sueanne Margarita, MD sent at 03/27/2018 10:56 PM EDT ----- Please let patient know that stress test was fine

## 2018-03-30 ENCOUNTER — Ambulatory Visit (HOSPITAL_COMMUNITY)
Admission: RE | Admit: 2018-03-30 | Discharge: 2018-03-30 | Disposition: A | Discharge status: Home / Self Care | Payer: Medicare Other | Source: Ambulatory Visit | Attending: Internal Medicine | Admitting: Internal Medicine

## 2018-03-30 DIAGNOSIS — I824Y9 Acute embolism and thrombosis of unspecified deep veins of unspecified proximal lower extremity: Secondary | ICD-10-CM | POA: Diagnosis present

## 2018-03-30 DIAGNOSIS — I824Y1 Acute embolism and thrombosis of unspecified deep veins of right proximal lower extremity: Secondary | ICD-10-CM

## 2018-03-30 NOTE — Telephone Encounter (Signed)
Stress test and echo are stable.  Patient has not had any further CP. Followup in 6 months in office

## 2018-03-30 NOTE — Telephone Encounter (Signed)
Spoke with the patient, she expressed understanding and had no further questions.

## 2018-03-30 NOTE — Telephone Encounter (Signed)
Spoke with the patient, she had been feeling good, she has not had any more chest pain. I advised her that if she notices the chest pain to return or worsen to give [place] a call.

## 2018-03-31 ENCOUNTER — Ambulatory Visit: Payer: Medicare Other | Admitting: Physician Assistant

## 2018-03-31 NOTE — Telephone Encounter (Signed)
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 Please repeat scan in 2 months.

## 2018-03-31 NOTE — Telephone Encounter (Signed)
Spoke with the patient, she expressed understanding about a repeat LE doppler in 2 months. She had no further questions.

## 2018-04-12 ENCOUNTER — Other Ambulatory Visit: Payer: Self-pay

## 2018-04-12 ENCOUNTER — Other Ambulatory Visit: Payer: Self-pay | Admitting: Sports Medicine

## 2018-04-12 ENCOUNTER — Encounter: Payer: Self-pay | Admitting: Sports Medicine

## 2018-04-12 ENCOUNTER — Ambulatory Visit (INDEPENDENT_AMBULATORY_CARE_PROVIDER_SITE_OTHER): Payer: Medicare Other

## 2018-04-12 ENCOUNTER — Ambulatory Visit (INDEPENDENT_AMBULATORY_CARE_PROVIDER_SITE_OTHER): Payer: Medicare Other | Admitting: Sports Medicine

## 2018-04-12 VITALS — BP 131/60 | HR 53 | Temp 98.2°F | Resp 16

## 2018-04-12 DIAGNOSIS — M2140 Flat foot [pes planus] (acquired), unspecified foot: Secondary | ICD-10-CM | POA: Diagnosis not present

## 2018-04-12 DIAGNOSIS — M722 Plantar fascial fibromatosis: Secondary | ICD-10-CM

## 2018-04-12 DIAGNOSIS — M79671 Pain in right foot: Secondary | ICD-10-CM

## 2018-04-12 MED ORDER — TRIAMCINOLONE ACETONIDE 10 MG/ML IJ SUSP
10.0000 mg | Freq: Once | INTRAMUSCULAR | Status: AC
  Administered 2018-04-12: 10 mg

## 2018-04-12 NOTE — Patient Instructions (Signed)

## 2018-04-12 NOTE — Progress Notes (Signed)
Subjective: Alexis Wheeler is a 63 y.o. female patient presents to office with complaint of moderate heel pain on the right. Patient admits to post static dyskinesia for 1 week in duration as a achy type of pain. Patient has treated this problem with pain patch with no relief. Denies any other pedal complaints.   Review of Systems  All other systems reviewed and are negative.    Patient Active Problem List   Diagnosis Date Noted  . Ascending aortic dissection ([place])   . OSA on CPAP 05/10/2017  . Benign essential HTN 05/10/2017  . Coronary artery disease 03/08/2016  . Diastolic dysfunction without heart failure 03/08/2016  . Chest pain 03/07/2016  . Fatigue 04/30/2015  . Hypokalemia 12/20/2014  . 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 Dilated cardiomyopathy ([place]) 08/01/2014  . Plantar fasciitis of left foot 05/02/2014  . Non-compliant behavior 03/27/2014  . S/P aortic dissection repair 03/03/2014  . Lumbosacral spondylosis without myelopathy 01/26/2014  . Greater trochanteric bursitis of right hip 10/10/2013  . Chronic low back pain 09/26/2013  . Chronic right hip pain 09/26/2013  . Allergic rhinitis 08/29/2012  . Kidney stone 12/31/2010  . Morbid obesity ([place]) 12/18/2009  . GERD 12/18/2009  . HEMATURIA UNSPECIFIED 12/17/2009  . Hyperlipidemia with target LDL less than 70 12/16/2009  . Hypertensive heart disease 12/16/2009    Current Outpatient Medications on File Prior to Visit  Medication Sig Dispense Refill  . amLODipine (NORVASC) 10 MG tablet TAKE 1 TABLET BY MOUTH ONCE DAILY 90 tablet 3  . aspirin EC 81 MG EC tablet Take 1 tablet (81 mg total) by mouth daily.    [name] atorvastatin (LIPITOR) 40 MG tablet TAKE 1 TABLET BY MOUTH ONCE DAILY AT 6 PM 90 tablet 3  . carvedilol (COREG) 12.5 MG tablet TAKE 1 TABLET BY MOUTH TWICE DAILY 180 tablet 3  . Cholecalciferol 1000 units tablet Take 1,000 Units by mouth daily.    . colesevelam (WELCHOL) 625 MG tablet  Take 1 tablet (625 mg total) by mouth 2 (two) times daily with a meal. 30 tablet 2  . colestipol (COLESTID) 1 g tablet Take 1 tablet (1 g total) by mouth 2 (two) times daily. 60 tablet 3  . dicyclomine (BENTYL) 20 MG tablet TAKE 1 TABLET BY MOUTH EVERY 8 HOURS 60 tablet 2  . doxazosin (CARDURA) 2 MG tablet Take 1 tablet (2 mg total) by mouth at bedtime. 30 tablet 11  . folic acid (FOLVITE) 1 MG tablet Take 1 mg by mouth daily.    [name] lisinopril (PRINIVIL,ZESTRIL) 40 MG tablet TAKE 1 TABLET BY MOUTH ONCE DAILY 90 tablet 3  . spironolactone (ALDACTONE) 50 MG tablet TAKE 1 TABLET BY MOUTH ONCE DAILY 90 tablet 2  . vitamin E 400 UNIT capsule Take 400 Units by mouth daily.     No current facility-administered medications on file prior to visit.     Allergies  Allergen Reactions  . Hydralazine Nausea Only  . Motrin [Ibuprofen] Nausea Only    Upset GI (motrin brand causes the side effect)    Objective: Physical Exam General: The patient is alert and oriented x3 in no acute distress.  Dermatology: Skin is warm, dry and supple bilateral lower extremities. Nails 1-10 are normal. There is no erythema, edema, no eccymosis, no open lesions present. Integument is otherwise unremarkable.  Vascular: Dorsalis Pedis pulse and Posterior Tibial pulse are 2/4 bilateral. Capillary fill time is immediate to all digits.  Neurological: Grossly intact to light touch  with an achilles reflex of +2/5 and a  negative Tinel's sign bilateral.  Musculoskeletal: Tenderness to palpation at the medial calcaneal tubercale and through the insertion of the plantar fascia on the right foot. No pain with compression of calcaneus bilateral. No pain with tuning fork to calcaneus bilateral. No pain with calf compression bilateral. There is decreased Ankle joint range of motion bilateral. All other joints range of motion within normal limits bilateral. +Pes planus. Strength 5/5 in all groups bilateral.   Gait: Unassisted, Antalgic  avoid weight on right heel  Xray, Right foot:  Normal osseous mineralization. Joint spaces preserved. No fracture/dislocation/boney destruction. Calcaneal spur present with mild thickening of plantar fascia. No other soft tissue abnormalities or radiopaque foreign bodies.   Assessment and Plan: Problem List Items Addressed This Visit    None    Visit Diagnoses    Plantar fasciitis, right    -  Primary   Relevant Medications   triamcinolone acetonide (KENALOG) 10 MG/ML injection 10 mg (Start on 04/12/2018  3:45 PM)   Other Relevant Orders   DG Foot Complete Right   Inflammatory heel pain, right       Pes planus, unspecified laterality         -Complete examination performed.  -Xrays reviewed -Discussed with patient in detail the condition of plantar fasciitis, how this occurs and general treatment options. Explained both conservative and surgical treatments.  -After oral consent and aseptic prep, injected a mixture containing 1 ml of 2%  plain lidocaine, 1 ml 0.5% plain marcaine, 0.5 ml of kenalog 10 and 0.5 ml of dexamethasone phosphate into right heel. Post-injection care discussed with patient.  -Recommended good supportive shoes -Explained in detail the use of the fascial brace for right which was dispensed at today's visit. -Explained and dispensed to patient daily stretching exercises. -Recommend patient to ice affected area 1-2x daily. -Patient to return to office as needed or sooner if problems or questions arise.  Landis Martins, DPM

## 2018-06-02 ENCOUNTER — Encounter (HOSPITAL_COMMUNITY): Payer: Medicare Other

## 2018-06-07 ENCOUNTER — Other Ambulatory Visit: Payer: Self-pay

## 2018-06-07 ENCOUNTER — Ambulatory Visit (HOSPITAL_COMMUNITY)
Admission: RE | Admit: 2018-06-07 | Discharge: 2018-06-07 | Disposition: A | Discharge status: Home / Self Care | Payer: Medicare Other | Source: Ambulatory Visit | Attending: Cardiology | Admitting: Cardiology

## 2018-06-07 DIAGNOSIS — I824Y1 Acute embolism and thrombosis of unspecified deep veins of right proximal lower extremity: Secondary | ICD-10-CM

## 2018-06-07 DIAGNOSIS — I824Y9 Acute embolism and thrombosis of unspecified deep veins of unspecified proximal lower extremity: Secondary | ICD-10-CM | POA: Insufficient documentation

## 2018-06-09 ENCOUNTER — Other Ambulatory Visit: Payer: Self-pay | Admitting: Cardiology

## 2018-06-11 ENCOUNTER — Other Ambulatory Visit: Payer: Self-pay | Admitting: Gastroenterology

## 2018-08-29 ENCOUNTER — Other Ambulatory Visit: Payer: Self-pay | Admitting: Nurse Practitioner

## 2018-08-29 DIAGNOSIS — I420 Dilated cardiomyopathy: Secondary | ICD-10-CM

## 2018-09-03 NOTE — Progress Notes (Signed)
CARDIOLOGY OFFICE NOTE  Date:  09/06/2018    Alexis Wheeler Date of Birth: [birth year] Medical Record [id number]  PCP:  Nolene Ebbs, MD  Cardiologist:  Rosanne Sack   Chief Complaint  Patient presents with  . Follow-up    History of Present Illness: Alexis Wheeler is a 63 y.o. female who presents today for a follow up visit. Seen for Dr. Radford Pax.   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 She underwent replacement of the ascending aorta with resuspension of the AV with 30 mm Hemashield Platinum graft with R axillary artery cannulation by Dr. Servando Snare. EF was 40-45% post op. Follow up echo from 07/2014 showed improvement of her EF - now at 55 to 60%.   Admitted back in December of 2016 with uncontrolled HTN. Medicines were all changed. She had stopped some. She was referred back here for management of BP. Did not have PCP. She was placed on spironolactone and changed her atenolol to carvedilol.   I then sawher back several times - BP still too high. Did not tolerate Hydralazine. Complained of what sounded like exertional fatigue - she was not cathed prior to her AAA dissection and I was worried about CAD. Myoview was obtained - turned out to be high risk - was referred for cath - this was reassuring - only with very mild CAD that is nonobstructive. When seen back in May of 2017 was having what sounded like pain from kidney stones. Ended up seeing GU.   I saw her back in December of 2017- money was a big issue. Medicines a little mixed up - on 2 beta blockers and not clear why - got that straightened out and increased the dose of the Coreg for better BP control. She was otherwise felt to be doing ok. On follow up in January she was doing ok. Trying to get a new PCP but having difficulty with this.   Back in February of 2018 she got a CT angio  when admitted with chest pain -- possible small PE versus scarring noted - she did not wish to be on blood thinner. No DVT on doppler study. She was not short of breath and clinically did not fit the picture for a PE. She had not been short of breath.   I saw her in April of 2018 - she was doing ok. BPstable. Still with these "weak spells" that the etiology is unknown. Having lots of trouble trying to get a PCP.   In the ERat the end of June 2018 withchest pain - negative evaluation. CTA of the chest noted. No PE on thatstudy noted but did have abnormality of the renal artery - I asked EBG to review.I then saw her following this last ER visit - she was doing better. Did have a PCP, had gotten on Medicare.  Last seen by me back in February of 2019. Still with undefined "weak spells".She saw Dr. Radford Pax this past February. Then seen by Dr. Stanford Breed as a work in this past March. She had had lower extremity dopplers due to a prior elevated d dimer - this showed DVT in the peroneal vein in the right lower extremity. This has persisted - since it was below the knee and no PE - she was not started on anticoagulation. She has been asymptomatic. She has  had follow up scans which have continued to show this - was to see her PCP.   The patient does not have symptoms concerning for COVID-19 infection (fever, chills, cough, or new shortness of breath).   Comes in today. Here alone. She is doing really well. No chest pain. Not short of breath. She is having less "weak" spells. She notes she stopped taking the Coreg twice a day and stopped the Cardura altogether. This has helped her considerably. BP is great. Seeing her PCP tomorrow. No recent labs. She is fasting today. She is quite happy overall. She has seen her PCP about her DVT - he has agreed with recommendations from Dr. Radford Pax & Stanford Breed - no anticoagulation. She has no symptoms with this.   Past Medical History:  Diagnosis Date  . Anemia    childhood   . Arthritis    right knee  . Ascending aortic dissection [facility])    s/p repair by Dr. Servando Snare  . Blood transfusion without reported diagnosis    with heart surgery-repair aorta  . CAD (coronary artery disease)    a. 04/2015: cath showed 30% Ost RCA stenosis, 20% mid-LAD stenosis, and normal LV function.   . Cataract    bilateral eyes, per pt.  . Congestive dilated cardiomyopathy ([place]) 08/01/2014  . GERD   . HEMATURIA UNSPECIFIED   . History of kidney stones   . HYPERLIPIDEMIA   . HYPERTENSION   . Morbid obesity ([place])   . Myocardial infarction ([place])    01/30/2014  . MYOSITIS   . OSA on CPAP 05/10/2017   mild OSA with an AHI of 5.2/hr overall but severe during REM sleep with an AHI of 41.4/hr and underwent CPAP titration to 12cm H2O  . Sleep apnea    cpap-  doesnt use it- last study years ago  . Vertigo     Past Surgical History:  Procedure Laterality Date  . ABDOMINAL HYSTERECTOMY  1983  . CARDIAC CATHETERIZATION N/A 04/30/2015   Procedure: Left Heart Cath and Coronary Angiography;  Surgeon: Peter M Martinique, MD;  Location: [facility];  Service: Cardiovascular;  Laterality: N/A;  . CHOLECYSTECTOMY  2001  . CYSTOSCOPY W/ RETROGRADES Left 08/02/2015   Procedure: CYSTOSCOPY WITH LEFT RETROGRADE PYELOGRAM;  Surgeon: Festus Aloe, MD;  Location: [facility];  Service: Urology;  Laterality: Left;  . CYSTOSCOPY WITH URETEROSCOPY, STONE BASKETRY AND STENT PLACEMENT Left 08/02/2015   Procedure:  Mitzi Davenport ;  Surgeon: Festus Aloe, MD;  Location: [facility];  Service: Urology;  Laterality: Left;  . CYSTOSCOPY/URETEROSCOPY/HOLMIUM LASER/STENT PLACEMENT Left 08/02/2015   Procedure: CYSTOSCOPY/URETEROSCOPY/HOLMIUM LASER/STENT PLACEMENT-LEFT;  Surgeon: Festus Aloe, MD;  Location: [facility];  Service: Urology;  Laterality: Left;  . HOLMIUM LASER APPLICATION Left [id number]   Procedure: HOLMIUM LASER APPLICATION;  Surgeon: Festus Aloe, MD;  Location: [facility];  Service: Urology;   Laterality: Left;  . REPLACEMENT ASCENDING AORTA N/A 03/03/2014   Procedure: REPAIR OF ASCENDING AORTIC DISSECTION;  Surgeon: Grace Isaac, MD;  Location: [facility];  Service: Open Heart Surgery;  Laterality: N/A;     Medications: Current Meds  Medication Sig  . amLODipine (NORVASC) 10 MG tablet TAKE 1 TABLET BY MOUTH ONCE DAILY  . aspirin EC 81 MG EC tablet Take 1 tablet (81 mg total) by mouth daily.  [name] atorvastatin (LIPITOR) 40 MG tablet TAKE 1 TABLET BY MOUTH ONCE DAILY AT 6 PM  . carvedilol (COREG) 12.5 MG tablet TAKE 1 TABLET BY MOUTH TWICE DAILY (Patient taking  differently: Only taking once a day)  . Cholecalciferol 1000 units tablet Take 1,000 Units by mouth daily.  . folic acid (FOLVITE) 1 MG tablet Take 1 mg by mouth daily.  [name] lisinopril (PRINIVIL,ZESTRIL) 40 MG tablet TAKE 1 TABLET BY MOUTH ONCE DAILY  . spironolactone (ALDACTONE) 50 MG tablet Take 1 tablet by mouth once daily  . vitamin E 400 UNIT capsule Take 400 Units by mouth daily.     Allergies: Allergies  Allergen Reactions  . Hydralazine Nausea Only  . Motrin [Ibuprofen] Nausea Only    Upset GI (motrin brand causes the side effect)    Social History: The patient  reports that she has never smoked. She has never used smokeless tobacco. She reports that she does not drink alcohol or use drugs.   Family History: The patient's family history includes Aortic dissection (age of onset: 35) in her mother; Breast cancer in her sister; Breast cancer (age of onset: 55) in her mother; Colon cancer in her brother; Esophageal cancer in her brother; Hypertension in her daughter, father, mother, and sister; Throat cancer in her brother.   Review of Systems: Please see the history of present illness.   All other systems are reviewed and negative.   Physical Exam: VS:  BP 124/62   Pulse 62   Ht 5\' 3"  (1.6 m)   Wt 199 lb 12.8 oz (90.6 kg)   SpO2 99%   BMI 35.39 kg/m  .  BMI Body mass index is 35.39 kg/m.  Wt Readings  from Last 3 Encounters:  09/06/18 199 lb 12.8 oz (90.6 kg)  03/24/18 204 lb (92.5 kg)  03/21/18 204 lb (92.5 kg)    General: Pleasant. Alert and in no acute distress. Weight is down a few pounds. Remains obese. She looks really good today.  HEENT: Normal.  Neck: Supple, no JVD, carotid bruits, or masses noted.  Cardiac: Regular rate and rhythm. No murmurs, rubs, or gallops. No edema.  Respiratory:  Lungs are clear to auscultation bilaterally with normal work of breathing.  GI: Soft and nontender.  MS: No deformity or atrophy. Gait and ROM intact.  Skin: Warm and dry. Color is normal.  Neuro:  Strength and sensation are intact and no gross focal deficits noted.  Psych: Alert, appropriate and with normal affect.   LABORATORY DATA:  EKG:  EKG is not ordered today.  Lab Results  Component Value Date   WBC 6.3 03/09/2018   HGB 12.5 03/09/2018   HCT 41.8 03/09/2018   PLT 236 03/09/2018   GLUCOSE 96 03/09/2018   CHOL 104 02/15/2017   TRIG 88 02/15/2017   HDL 48 02/15/2017   LDLCALC 38 02/15/2017   ALT 13 02/15/2017   AST 11 02/15/2017   NA 138 03/09/2018   K 4.3 03/09/2018   CL 106 03/09/2018   CREATININE 1.33 (H) 03/09/2018   BUN 14 03/09/2018   CO2 25 03/09/2018   TSH 0.968 08/31/2016   INR 0.94 04/26/2015   HGBA1C 6.0 (H) 08/31/2016     BNP (last 3 results) No results for input(s): BNP in the last 8760 hours.  ProBNP (last 3 results) No results for input(s): PROBNP in the last 8760 hours.   Other Studies Reviewed Today:  CTA CHEST IMPRESSION 03/2018: No evidence of pulmonary embolism or other acute findings in the chest. The current CTA was timed strictly for pulmonary arterial evaluation and is not adequate for aortic evaluation. The recent CTA on 03/10/2018 was timed for aortic evaluation.  Electronically Signed   By: Aletta Edouard M.D.   On: 03/15/2018 16:37  Lower Extremity Doppler Summary 05/2018:   Right: Findings consistent with continued  deep vein thrombosis involving the right peroneal vein. Findings appear essentially unchanged compared to previous examination. No cystic structure found in the popliteal fossa. All other veins visualized appear  fully compressible and demonstrate appropriate Doppler characteristics.   Left: No evidence of common femoral vein obstruction.   *See table(s) above for measurements and observations.  Electronically signed by Larae Grooms MD on 06/09/2018 at 9:34:25 AM.      Myoview Study Highlights 03/2018    Nuclear stress EF: 57%. No wall motion abnormalities  The left ventricular ejection fraction is normal (55-65%).  There was no ST segment deviation noted during stress.  This is a low risk study. No perfusion defects identified.  The study is normal.    Candee Furbish, MD     ECHO IMPRESSIONS 03/2018   1. The left ventricle has normal systolic function with an ejection fraction of 60-65%. The cavity size was normal. There is mildly increased left ventricular wall thickness. Left ventricular diastolic Doppler parameters are consistent with impaired  relaxation.  2. The right ventricle has normal systolic function. The cavity was normal. There is no increase in right ventricular wall thickness.  3. The tricuspid valve is grossly normal.  4. The aortic valve is tricuspid Mild thickening of the aortic valve Aortic valve regurgitation is mild by color flow Doppler.  5. Definity used; normal LV systolic function; mild diastolic dysfunction; mild LVH; mildly dilated aortic root; sclerotic aortic valve with mild AI.  6. The interatrial septum is aneurysmal.   Cardiac Cath Procedures 04/2015   Left Heart Cath and Coronary Angiography    Conclusion    Ost RCA to Prox RCA lesion, 30% stenosed.  Mid LAD lesion, 20% stenosed.  The left ventricular systolic function is normal.  1. Mild nonobstructive CAD 2. Normal LV function.  Plan: medical management.         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 She remains asymptomatic.   2. Prior history of aortic dissection - last scan from February - stable. Sees EBG next year.   3. Chronic "weak spells" - improved with her cutting her medicines back.   4. HTN - BP recheck by me is 120/60. I did ask her to split her Coreg dose and take BID. Would leave off Cardura.   5. HLD - lab today.   6. Chronic chest pain - no real CAD by prior cath and reassuring Myoview. Not endorsed today.   7. Iron deficiency anemia - per PCP - lab today.   8. Prior dilated CM - EF has recovered.   9. COVID-19 Education: The signs and symptoms of COVID-19 were discussed with the patient and how to seek care for testing (follow up with PCP or arrange E-visit).  The importance of social distancing, staying at home, hand hygiene and wearing a mask when out in public were discussed today.         Current medicines are reviewed with the patient today.  The patient does not have concerns regarding medicines other than what has been noted above.  The following changes have been made:  See above.  Labs/ tests ordered today include:    Orders Placed This Encounter  Procedures  . Basic metabolic panel  . CBC  . Hepatic function panel  . Lipid panel  Disposition:   FU with [place] in about 4 months.   Patient is agreeable to this plan and will call if any problems develop in the interim.   SignedTruitt Merle, NP  09/06/2018 10:39 AM  [facility] [address] [place], [place]  [address] Phone: [phone] Fax: [phone]

## 2018-09-06 ENCOUNTER — Ambulatory Visit (INDEPENDENT_AMBULATORY_CARE_PROVIDER_SITE_OTHER): Payer: Medicare Other | Admitting: Nurse Practitioner

## 2018-09-06 ENCOUNTER — Encounter: Payer: Self-pay | Admitting: Nurse Practitioner

## 2018-09-06 ENCOUNTER — Other Ambulatory Visit: Payer: Self-pay

## 2018-09-06 VITALS — BP 124/62 | HR 62 | Ht 63.0 in | Wt 199.8 lb

## 2018-09-06 DIAGNOSIS — E782 Mixed hyperlipidemia: Secondary | ICD-10-CM

## 2018-09-06 DIAGNOSIS — I10 Essential (primary) hypertension: Secondary | ICD-10-CM | POA: Diagnosis not present

## 2018-09-06 DIAGNOSIS — I7101 Dissection of ascending aorta: Secondary | ICD-10-CM

## 2018-09-06 DIAGNOSIS — Z9889 Other specified postprocedural states: Secondary | ICD-10-CM | POA: Diagnosis not present

## 2018-09-06 DIAGNOSIS — Z7189 Other specified counseling: Secondary | ICD-10-CM

## 2018-09-06 DIAGNOSIS — I420 Dilated cardiomyopathy: Secondary | ICD-10-CM

## 2018-09-06 DIAGNOSIS — Z9989 Dependence on other enabling machines and devices: Secondary | ICD-10-CM

## 2018-09-06 DIAGNOSIS — I2510 Atherosclerotic heart disease of native coronary artery without angina pectoris: Secondary | ICD-10-CM

## 2018-09-06 DIAGNOSIS — I824Y9 Acute embolism and thrombosis of unspecified deep veins of unspecified proximal lower extremity: Secondary | ICD-10-CM | POA: Diagnosis not present

## 2018-09-06 DIAGNOSIS — G4733 Obstructive sleep apnea (adult) (pediatric): Secondary | ICD-10-CM

## 2018-09-06 LAB — BASIC METABOLIC PANEL
BUN/Creatinine Ratio: 14 (ref 12–28)
BUN: 18 mg/dL (ref 8–27)
CO2: 22 mmol/L (ref 20–29)
Calcium: 9.4 mg/dL (ref 8.7–10.3)
Chloride: 103 mmol/L (ref 96–106)
Creatinine, Ser: 1.31 mg/dL — ABNORMAL HIGH (ref 0.57–1.00)
GFR calc Af Amer: 50 mL/min/{1.73_m2} — ABNORMAL LOW (ref >59)
GFR calc non Af Amer: 43 mL/min/{1.73_m2} — ABNORMAL LOW (ref >59)
Glucose: 101 mg/dL — ABNORMAL HIGH (ref 65–99)
Potassium: 4.7 mmol/L (ref 3.5–5.2)
Sodium: 140 mmol/L (ref 134–144)

## 2018-09-06 LAB — LIPID PANEL
Chol/HDL Ratio: 2.8 ratio (ref 0.0–4.4)
Cholesterol, Total: 146 mg/dL (ref 100–199)
HDL: 53 mg/dL (ref >39)
LDL Calculated: 81 mg/dL (ref 0–99)
Triglycerides: 61 mg/dL (ref 0–149)
VLDL Cholesterol Cal: 12 mg/dL (ref 5–40)

## 2018-09-06 LAB — HEPATIC FUNCTION PANEL
ALT: 14 IU/L (ref 0–32)
AST: 12 IU/L (ref 0–40)
Albumin: 4.3 g/dL (ref 3.8–4.8)
Alkaline Phosphatase: 78 IU/L (ref 39–117)
Bilirubin Total: 0.4 mg/dL (ref 0.0–1.2)
Bilirubin, Direct: 0.14 mg/dL (ref 0.00–0.40)
Total Protein: 6.5 g/dL (ref 6.0–8.5)

## 2018-09-06 LAB — CBC
Hematocrit: 39.6 % (ref 34.0–46.6)
Hemoglobin: 12.7 g/dL (ref 11.1–15.9)
MCH: 23.3 pg — ABNORMAL LOW (ref 26.6–33.0)
MCHC: 32.1 g/dL (ref 31.5–35.7)
MCV: 73 fL — ABNORMAL LOW (ref 79–97)
Platelets: 215 10*3/uL (ref 150–450)
RBC: 5.44 x10E6/uL — ABNORMAL HIGH (ref 3.77–5.28)
RDW: 14.4 % (ref 11.7–15.4)
WBC: 6 10*3/uL (ref 3.4–10.8)

## 2018-09-06 NOTE — Patient Instructions (Addendum)
After Visit Summary:  We will be checking the following labs today - BMET, CBC, HPF and lipids   Medication Instructions:    Continue with your current medicines.   I would prefer you to split the Coreg in half and use half a tablet twice a day   If you need a refill on your cardiac medications before your next appointment, please call your pharmacy.     Testing/Procedures To Be Arranged:  N/A  Follow-Up:   See me in 4 months.     At [facility], you and your health needs are our priority.  As part of our continuing mission to provide you with exceptional heart care, we have created designated Provider Care Teams.  These Care Teams include your primary Cardiologist (physician) and Advanced Practice Providers (APPs -  Physician Assistants and Nurse Practitioners) who all work together to provide you with the care you need, when you need it.  Special Instructions:  . Stay safe, stay home, wash your hands for at least 20 seconds and wear a mask when out in public.  . It was good to talk with you today.    Call the [place] office at [phone] if you have any questions, problems or concerns.

## 2018-09-28 ENCOUNTER — Other Ambulatory Visit: Payer: Self-pay | Admitting: Internal Medicine

## 2018-09-28 DIAGNOSIS — Z1231 Encounter for screening mammogram for malignant neoplasm of breast: Secondary | ICD-10-CM

## 2018-10-01 ENCOUNTER — Other Ambulatory Visit: Payer: Self-pay | Admitting: Nurse Practitioner

## 2018-11-07 ENCOUNTER — Telehealth (HOSPITAL_COMMUNITY): Payer: Self-pay | Admitting: *Deleted

## 2018-11-07 NOTE — Telephone Encounter (Signed)
Left msg asking for return call to schedule appointment with vascular lab.

## 2018-11-08 ENCOUNTER — Telehealth (HOSPITAL_COMMUNITY): Payer: Self-pay | Admitting: *Deleted

## 2018-11-08 ENCOUNTER — Other Ambulatory Visit (HOSPITAL_COMMUNITY): Payer: Self-pay | Admitting: Internal Medicine

## 2018-11-08 DIAGNOSIS — I82451 Acute embolism and thrombosis of right peroneal vein: Secondary | ICD-10-CM

## 2018-11-08 NOTE — Telephone Encounter (Signed)
The above patient or their representative was contacted and gave the following answers to these questions:         Do you have any of the following symptoms?    NO  Fever                    Cough                   Shortness of breath  Do  you have any of the following other symptoms?    muscle pain         vomiting,        diarrhea        rash         weakness        red eye        abdominal pain         bruising          bruising or bleeding              joint pain           severe headache    Have you been in contact with someone who was or has been sick in the past 2 weeks?  NO  Yes                 Unsure                         Unable to assess   Does the person that you were in contact with have any of the following symptoms?   Cough         shortness of breath           muscle pain         vomiting,            diarrhea            rash            weakness           fever            red eye           abdominal pain           bruising  or  bleeding                joint pain                severe headache                 COMMENTS OR ACTION PLAN FOR THIS PATIENT:   NO- negative test two weeks ago.

## 2018-11-09 ENCOUNTER — Ambulatory Visit (HOSPITAL_COMMUNITY)
Admission: RE | Admit: 2018-11-09 | Discharge: 2018-11-09 | Disposition: A | Discharge status: Home / Self Care | Payer: Medicare Other | Source: Ambulatory Visit | Attending: Family | Admitting: Family

## 2018-11-09 ENCOUNTER — Other Ambulatory Visit: Payer: Self-pay

## 2018-11-09 DIAGNOSIS — I82451 Acute embolism and thrombosis of right peroneal vein: Secondary | ICD-10-CM | POA: Diagnosis present

## 2018-11-09 NOTE — Progress Notes (Addendum)
  Cardiovascular Imaging at [facility]  (Vascular Lab)      Lower extremity venous duplex has been completed and is positive for age-indeterminate DVT.  Preliminary results given to [facility] at Dr. Santiago Bur office at 11:33 am. Technologist left a message for the nurse. Call was not returned. Patient was encouraged to follow up with Dr. Jeanie Cooks.   Please see the preliminary report in the CV proc tab in Chart Review.      Vinie Sill, RDMS, RVT

## 2018-11-17 ENCOUNTER — Other Ambulatory Visit: Payer: Self-pay | Admitting: Gastroenterology

## 2018-11-28 ENCOUNTER — Other Ambulatory Visit: Payer: Self-pay | Admitting: Gastroenterology

## 2018-12-16 ENCOUNTER — Other Ambulatory Visit: Payer: Self-pay

## 2018-12-16 ENCOUNTER — Ambulatory Visit
Admission: RE | Admit: 2018-12-16 | Discharge: 2018-12-16 | Disposition: A | Discharge status: Home / Self Care | Payer: Medicare Other | Source: Ambulatory Visit | Attending: Internal Medicine | Admitting: Internal Medicine

## 2018-12-16 DIAGNOSIS — Z1231 Encounter for screening mammogram for malignant neoplasm of breast: Secondary | ICD-10-CM

## 2018-12-24 NOTE — Progress Notes (Signed)
CARDIOLOGY OFFICE NOTE  Date:  12/27/2018    Alexis Wheeler Date of Birth: July 05, 1955 Medical Record [id number]  PCP:  Nolene Ebbs, MD  Cardiologist:  Rosanne Sack   Chief Complaint  Patient presents with  . Follow-up    Seen for Dr. Radford Pax    History of Present Illness: Alexis Wheeler is a 63 y.o. female who presents today for a follow up visit.  Seen for Dr. Radford Pax.   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 She underwent replacement of the ascending aorta with resuspension of the AV with 30 mm Hemashield Platinum graft with R axillary artery cannulation by Dr. Servando Snare. EF was 40-45% post op. Follow up echo from 07/2014 showed improvement of her EF - now at 55 to 60%.   I have followed her over the past several years - she has had issues with her BP, stopping medicines/mixing up medicines/trouble affording medicines, trouble finding PCP, chronic fatigue/"weak spells" - ended up having Myoview that led to cath - only has very mild CAD. She has had kidney stones. In 2018 she had CTA when admitted with chest pain -- possible small PE versus scarring noted - she did not wish to be on blood thinner. No DVT on doppler study. She was not short of breath and clinically did not fit the picture for a PE. Earlier this year in 2020 she had lower extremity dopplers due to prior elevated d dimer and has DVT in the peroneal vein on the right. This has persisted and since below the knee and no PE - not on anticoagulation.   I last saw her in August - she was doing very well - less "weak spells" - she was only taking Coreg once a day and had stopped Cardura - this helped her feel better.   The patient does not have symptoms concerning for COVID-19 infection (fever, chills, cough, or new shortness of breath).   Comes in today. Here alone.  She is doing well.  No chest pain. Breathing is good. She is not really having any more of the weak spells. Still may miss some of her Coreg 2nd dose. For repeat scan with EBG in March of 2021. Saw her PCP yesterday. She has gained some weight. She has lots of "screen" time instead of physical activity. Some minor swelling in her legs - now in support stockings -notes some brawny stasis changes. Overall, she feels like she is doing well .  Past Medical History:  Diagnosis Date  . Anemia    childhood  . Arthritis    right knee  . Ascending aortic dissection [facility])    s/p repair by Dr. Servando Snare  . Blood transfusion without reported diagnosis    with heart surgery-repair aorta  . CAD (coronary artery disease)    a. 04/2015: cath showed 30% Ost RCA stenosis, 20% mid-LAD stenosis, and normal LV function.   . Cataract    bilateral eyes, per pt.  . Congestive dilated cardiomyopathy ([place]) 08/01/2014  . GERD   . HEMATURIA UNSPECIFIED   . History of kidney stones   . HYPERLIPIDEMIA   . HYPERTENSION   . Morbid obesity ([place])   . Myocardial infarction ([place])    01/30/2014  . MYOSITIS   . OSA on CPAP 05/10/2017   mild OSA with an AHI of  5.2/hr overall but severe during REM sleep with an AHI of 41.4/hr and underwent CPAP titration to 12cm H2O  . Sleep apnea    cpap-  doesnt use it- last study years ago  . Vertigo     Past Surgical History:  Procedure Laterality Date  . ABDOMINAL HYSTERECTOMY  1983  . CARDIAC CATHETERIZATION N/A 04/30/2015   Procedure: Left Heart Cath and Coronary Angiography;  Surgeon: Peter M Martinique, MD;  Location: [place] [facility];  Service: Cardiovascular;  Laterality: N/A;  . CHOLECYSTECTOMY  2001  . CYSTOSCOPY W/ RETROGRADES Left 08/02/2015   Procedure: CYSTOSCOPY WITH LEFT RETROGRADE PYELOGRAM;  Surgeon: Festus Aloe, MD;  Location: [facility];  Service: Urology;  Laterality: Left;  . CYSTOSCOPY WITH URETEROSCOPY, STONE BASKETRY AND STENT PLACEMENT Left 08/02/2015   Procedure:  Mitzi Davenport  ;  Surgeon: Festus Aloe, MD;  Location: [facility];  Service: Urology;  Laterality: Left;  . CYSTOSCOPY/URETEROSCOPY/HOLMIUM LASER/STENT PLACEMENT Left 08/02/2015   Procedure: CYSTOSCOPY/URETEROSCOPY/HOLMIUM LASER/STENT PLACEMENT-LEFT;  Surgeon: Festus Aloe, MD;  Location: [facility];  Service: Urology;  Laterality: Left;  . HOLMIUM LASER APPLICATION Left [id number]   Procedure: HOLMIUM LASER APPLICATION;  Surgeon: Festus Aloe, MD;  Location: [facility];  Service: Urology;  Laterality: Left;  . REPLACEMENT ASCENDING AORTA N/A 03/03/2014   Procedure: REPAIR OF ASCENDING AORTIC DISSECTION;  Surgeon: Grace Isaac, MD;  Location: [place];  Service: Open Heart Surgery;  Laterality: N/A;     Medications: Current Meds  Medication Sig  . amLODipine (NORVASC) 10 MG tablet Take 1 tablet by mouth once daily  . aspirin EC 81 MG EC tablet Take 1 tablet (81 mg total) by mouth daily.  [name] atorvastatin (LIPITOR) 40 MG tablet TAKE 1 TABLET BY MOUTH ONCE DAILY AT  6  PM  . carvedilol (COREG) 12.5 MG tablet TAKE 1 TABLET BY MOUTH TWICE DAILY  . Cholecalciferol 1000 units tablet Take 1,000 Units by mouth daily.  . folic acid (FOLVITE) 1 MG tablet Take 1 mg by mouth daily.  [name] lisinopril (ZESTRIL) 40 MG tablet Take 1 tablet by mouth once daily  . spironolactone (ALDACTONE) 50 MG tablet Take 1 tablet by mouth once daily  . vitamin E 400 UNIT capsule Take 400 Units by mouth daily.     Allergies: Allergies  Allergen Reactions  . Hydralazine Nausea Only  . Motrin [Ibuprofen] Nausea Only    Upset GI (motrin brand causes the side effect)    Social History: The patient  reports that she has never smoked. She has never used smokeless tobacco. She reports that she does not drink alcohol or use drugs.   Family History: The patient's family history includes Aortic dissection (age of onset: 3) in her mother; Breast cancer in her sister; Breast cancer (age of onset: 20) in her mother; Colon cancer in her  brother; Esophageal cancer in her brother; Hypertension in her daughter, father, mother, and sister; Throat cancer in her brother.   Review of Systems: Please see the history of present illness.   All other systems are reviewed and negative.   Physical Exam: VS:  BP 122/60   Pulse (!) 55   Ht 5\' 3"  (1.6 m)   Wt 205 lb (93 kg)   SpO2 100%   BMI 36.31 kg/m  .  BMI Body mass index is 36.31 kg/m.  Wt Readings from Last 3 Encounters:  12/27/18 205 lb (93 kg)  09/06/18 199 lb 12.8 oz (90.6 kg)  03/24/18 204 lb (92.5 kg)  General: Pleasant. Alert and in no acute distress.  She is obese.  HEENT: Normal.  Neck: Supple, no JVD, carotid bruits, or masses noted.  Cardiac: Regular rate and rhythm. No murmurs, rubs, or gallops. No edema.  Respiratory:  Lungs are clear to auscultation bilaterally with normal work of breathing.  GI: Soft and nontender.  MS: No deformity or atrophy. Gait and ROM intact.  Skin: Warm and dry. Color is normal.  Neuro:  Strength and sensation are intact and no gross focal deficits noted.  Psych: Alert, appropriate and with normal affect.   LABORATORY DATA:  EKG:  EKG is not ordered today.  Lab Results  Component Value Date   WBC 6.0 09/06/2018   HGB 12.7 09/06/2018   HCT 39.6 09/06/2018   PLT 215 09/06/2018   GLUCOSE 101 (H) 09/06/2018   CHOL 146 09/06/2018   TRIG 61 09/06/2018   HDL 53 09/06/2018   LDLCALC 81 09/06/2018   ALT 14 09/06/2018   AST 12 09/06/2018   NA 140 09/06/2018   K 4.7 09/06/2018   CL 103 09/06/2018   CREATININE 1.31 (H) 09/06/2018   BUN 18 09/06/2018   CO2 22 09/06/2018   TSH 0.968 08/31/2016   INR 0.94 04/26/2015   HGBA1C 6.0 (H) 08/31/2016     BNP (last 3 results) No results for input(s): BNP in the last 8760 hours.  ProBNP (last 3 results) No results for input(s): PROBNP in the last 8760 hours.   Other Studies Reviewed Today:  CTA CHEST IMPRESSION 03/2018: No evidence of pulmonary embolism or other acute  findings in the chest. The current CTA was timed strictly for pulmonary arterial evaluation and is not adequate for aortic evaluation. The recent CTA on 03/10/2018 was timed for aortic evaluation.   Electronically Signed By: Aletta Edouard M.D. On: 03/15/2018 16:37  Lower Extremity Doppler Summary 05/2018:  Right: Findings consistent with continued deep vein thrombosis involving the right peroneal vein. Findings appear essentially unchanged compared to previous examination. No cystic structure found in the popliteal fossa. All other veins visualized appear  fully compressible and demonstrate appropriate Doppler characteristics.  Left: No evidence of common femoral vein obstruction.  *See table(s) above for measurements and observations.  Electronically signed by Larae Grooms MD on 06/09/2018 at 9:34:25 AM.     Myoview Study Highlights 03/2018    Nuclear stress EF: 57%. No wall motion abnormalities  The left ventricular ejection fraction is normal (55-65%).  There was no ST segment deviation noted during stress.  This is a low risk study. No perfusion defects identified.  The study is normal.   Candee Furbish, MD    ECHO IMPRESSIONS 03/2018  1. The left ventricle has normal systolic function with an ejection fraction of 60-65%. The cavity size was normal. There is mildly increased left ventricular wall thickness. Left ventricular diastolic Doppler parameters are consistent with impaired  relaxation. 2. The right ventricle has normal systolic function. The cavity was normal. There is no increase in right ventricular wall thickness. 3. The tricuspid valve is grossly normal. 4. The aortic valve is tricuspid Mild thickening of the aortic valve Aortic valve regurgitation is mild by color flow Doppler. 5. Definity used; normal LV systolic function; mild diastolic dysfunction; mild LVH; mildly dilated aortic root; sclerotic aortic valve with mild  AI. 6. The interatrial septum is aneurysmal.      Cardiac Cath Procedures 04/2015   Left Heart Cath and Coronary Angiography    Conclusion    Ost RCA  to Prox RCA lesion, 30% stenosed.  Mid LAD lesion, 20% stenosed.  The left ventricular systolic function is normal.  1. Mild nonobstructive CAD 2. Normal LV function.  Plan: medical management.    Assessment & Plan:  1. Peroneal DVT - chronic - wearing support stockings - trying to work on her weight.   2. Prior aortic dissection - for repeat scan in March of 2021 with EBG.   3. HTN - BP is great. No changes made. She is checking at home as well.   4. HLD - on statin - labs from August noted.   5. Prior dilated cardiomyopathy - resolved.   6. Non obstructive CAD - managed medically - needs to work on CV risk factor modification - discussed at length with her.   7. Iron deficiency anemia - lab from August noted.   8. Borderline DM - per PCP  9. COVID-19 Education: The signs and symptoms of COVID-19 were discussed with the patient and how to seek care for testing (follow up with PCP or arrange E-visit).  The importance of social distancing, staying at home, hand hygiene and wearing a mask when out in public were discussed today.  Current medicines are reviewed with the patient today.  The patient does not have concerns regarding medicines other than what has been noted above.  The following changes have been made:  See above.  Labs/ tests ordered today include:   No orders of the defined types were placed in this encounter.    Disposition:   FU with me in 4 months - will check lab on return if she has not had in the interim.   Patient is agreeable to this plan and will call if any problems develop in the interim.   SignedTruitt Merle, NP  12/27/2018 11:26 AM  [facility] [address] [place], [place]  [address] Phone: [phone] Fax: [phone]

## 2018-12-27 ENCOUNTER — Ambulatory Visit (INDEPENDENT_AMBULATORY_CARE_PROVIDER_SITE_OTHER): Payer: Medicare Other | Admitting: Nurse Practitioner

## 2018-12-27 ENCOUNTER — Encounter: Payer: Self-pay | Admitting: Nurse Practitioner

## 2018-12-27 ENCOUNTER — Other Ambulatory Visit: Payer: Self-pay

## 2018-12-27 VITALS — BP 122/60 | HR 55 | Ht 63.0 in | Wt 205.0 lb

## 2018-12-27 DIAGNOSIS — Z7189 Other specified counseling: Secondary | ICD-10-CM

## 2018-12-27 DIAGNOSIS — E782 Mixed hyperlipidemia: Secondary | ICD-10-CM | POA: Diagnosis not present

## 2018-12-27 DIAGNOSIS — Z9889 Other specified postprocedural states: Secondary | ICD-10-CM | POA: Diagnosis not present

## 2018-12-27 DIAGNOSIS — I10 Essential (primary) hypertension: Secondary | ICD-10-CM

## 2018-12-27 DIAGNOSIS — I2510 Atherosclerotic heart disease of native coronary artery without angina pectoris: Secondary | ICD-10-CM

## 2018-12-27 NOTE — Patient Instructions (Addendum)
After Visit Summary:  We will be checking the following labs today - NONE   Medication Instructions:    Continue with your current medicines.    If you need a refill on your cardiac medications before your next appointment, please call your pharmacy.     Testing/Procedures To Be Arranged:  N/A  Follow-Up:   See me in 4 months with labs    At [facility], you and your health needs are our priority.  As part of our continuing mission to provide you with exceptional heart care, we have created designated Provider Care Teams.  These Care Teams include your primary Cardiologist (physician) and Advanced Practice Providers (APPs -  Physician Assistants and Nurse Practitioners) who all work together to provide you with the care you need, when you need it.  Special Instructions:  . Stay safe, stay home, wash your hands for at least 20 seconds and wear a mask when out in public.  . It was good to talk with you today.    Call the [place] office at [phone] if you have any questions, problems or concerns.

## 2019-01-11 ENCOUNTER — Other Ambulatory Visit: Payer: Self-pay | Admitting: Nurse Practitioner

## 2019-01-11 DIAGNOSIS — I420 Dilated cardiomyopathy: Secondary | ICD-10-CM

## 2019-01-22 ENCOUNTER — Other Ambulatory Visit: Payer: Self-pay | Admitting: Nurse Practitioner

## 2019-01-22 DIAGNOSIS — I420 Dilated cardiomyopathy: Secondary | ICD-10-CM

## 2019-02-02 ENCOUNTER — Other Ambulatory Visit: Payer: Self-pay

## 2019-02-02 ENCOUNTER — Emergency Department (HOSPITAL_COMMUNITY)
Admission: EM | Admit: 2019-02-02 | Discharge: 2019-02-02 | Disposition: A | Discharge status: Home / Self Care | Payer: Medicare Other | Attending: Emergency Medicine | Admitting: Emergency Medicine

## 2019-02-02 ENCOUNTER — Encounter (HOSPITAL_COMMUNITY): Payer: Self-pay | Admitting: Emergency Medicine

## 2019-02-02 ENCOUNTER — Emergency Department (HOSPITAL_COMMUNITY): Payer: Medicare Other

## 2019-02-02 DIAGNOSIS — Z5321 Procedure and treatment not carried out due to patient leaving prior to being seen by health care provider: Secondary | ICD-10-CM | POA: Insufficient documentation

## 2019-02-02 DIAGNOSIS — R0789 Other chest pain: Secondary | ICD-10-CM | POA: Diagnosis not present

## 2019-02-02 LAB — BASIC METABOLIC PANEL
Anion gap: 8 (ref 5–15)
BUN: 16 mg/dL (ref 8–23)
CO2: 26 mmol/L (ref 22–32)
Calcium: 9.3 mg/dL (ref 8.9–10.3)
Chloride: 103 mmol/L (ref 98–111)
Creatinine, Ser: 1.33 mg/dL — ABNORMAL HIGH (ref 0.44–1.00)
GFR calc Af Amer: 49 mL/min — ABNORMAL LOW (ref >60)
GFR calc non Af Amer: 42 mL/min — ABNORMAL LOW (ref >60)
Glucose, Bld: 104 mg/dL — ABNORMAL HIGH (ref 70–99)
Potassium: 4.6 mmol/L (ref 3.5–5.1)
Sodium: 137 mmol/L (ref 135–145)

## 2019-02-02 LAB — CBC
HCT: 42.2 % (ref 36.0–46.0)
Hemoglobin: 12.8 g/dL (ref 12.0–15.0)
MCH: 23 pg — ABNORMAL LOW (ref 26.0–34.0)
MCHC: 30.3 g/dL (ref 30.0–36.0)
MCV: 75.8 fL — ABNORMAL LOW (ref 80.0–100.0)
Platelets: 229 10*3/uL (ref 150–400)
RBC: 5.57 MIL/uL — ABNORMAL HIGH (ref 3.87–5.11)
RDW: 15.5 % (ref 11.5–15.5)
WBC: 6.5 10*3/uL (ref 4.0–10.5)
nRBC: 0 % (ref 0.0–0.2)

## 2019-02-02 LAB — TROPONIN I (HIGH SENSITIVITY): Troponin I (High Sensitivity): 3 ng/L (ref <18)

## 2019-02-02 NOTE — ED Notes (Signed)
Pt stated she is going home and will follow up with PCP.

## 2019-02-02 NOTE — ED Triage Notes (Signed)
Pt reports a nagging left sided chest pain since yesterday. Pt took 81 ASA today with no relief. Hx of MI in 2016. Denies any SOB, N/V or back pain.

## 2019-02-03 ENCOUNTER — Telehealth: Payer: Self-pay | Admitting: Nurse Practitioner

## 2019-02-03 ENCOUNTER — Telehealth: Payer: Self-pay

## 2019-02-03 NOTE — Telephone Encounter (Signed)
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   Thanks lori

## 2019-02-03 NOTE — Progress Notes (Signed)
CARDIOLOGY OFFICE NOTE  Date:  02/07/2019    Alexis Wheeler Date of Birth: 1955/03/18 Medical Record [id number]  PCP:  Nolene Ebbs, MD  Cardiologist:  Rosanne Sack   Chief Complaint  Patient presents with  . Chest Pain    History of Present Illness: Alexis Wheeler is a 64 y.o. female who presents today for a work in visit.  Seen for Dr. Radford Pax.  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 She underwent replacement of the ascending aorta with resuspension of the AV with 30 mm Hemashield Platinum graft with R axillary artery cannulation by Dr. Servando Snare. EF was 40-45% post op. Follow up echo from 07/2014 showed improvement of her EF - now at 55 to 60%.   I have followed her over the past several years - she has had issues with her BP, stopping medicines/mixing up medicines/trouble affording medicines, trouble finding PCP, chronic fatigue/"weak spells" - ended up having Myoview that led to cath - only has very mild CAD. She has had kidney stones. In 2018 she had CTA when admitted with chest pain -- possible small PE versus scarring noted - she did not wish to be on blood thinner. No DVT on doppler study. She was not short of breath and clinically did not fit the picture for a PE. Earlier in 2020 she had lower extremity dopplers due to prior elevated d dimer and has DVT in the peroneal vein on the right. This has persisted and since below the knee and no PE - not on anticoagulation.   When I saw her in August - she was doing very well - less "weak spells" - she was only taking Coreg once a day and had stopped Cardura - this helped her feel better. My last visit was in December - she was continuing to do well but had gained some weight - lots of "screen time".   Called last week with chest pain - did not wish to go to the ER - thus added to my schedule  for today.   The patient does not have symptoms concerning for COVID-19 infection (fever, chills, cough, or new shortness of breath).   Comes in today. Here alone. She did end up going to the ER last week - had EKG and CXR with labs - negative troponin. She dismissed herself due to other patients and the wait time - not formally seen. Her chest pain comes and goes - definitively worse with stress - it is her roommate that is the issue - he is leaving later this week - she has had papers served on him - very difficult situation. Her chest pain has improved some but still not resolved. Not short of breath. Not dizzy. BP and HR have been fine. She is tolerating her medicines.    Past Medical History:  Diagnosis Date  . Anemia    childhood  . Arthritis    right knee  . Ascending aortic dissection [facility])    s/p repair by Dr. Servando Snare  . Blood transfusion without reported diagnosis    with heart surgery-repair aorta  . CAD (coronary artery disease)    a. 04/2015: cath showed 30% Ost RCA stenosis, 20% mid-LAD stenosis, and normal LV function.   . Cataract    bilateral eyes, per pt.  . Congestive dilated cardiomyopathy ([place])  08/01/2014  . GERD   . HEMATURIA UNSPECIFIED   . History of kidney stones   . HYPERLIPIDEMIA   . HYPERTENSION   . Morbid obesity ([place])   . Myocardial infarction ([place])    01/30/2014  . MYOSITIS   . OSA on CPAP 05/10/2017   mild OSA with an AHI of 5.2/hr overall but severe during REM sleep with an AHI of 41.4/hr and underwent CPAP titration to 12cm H2O  . Sleep apnea    cpap-  doesnt use it- last study years ago  . Vertigo     Past Surgical History:  Procedure Laterality Date  . ABDOMINAL HYSTERECTOMY  1983  . CARDIAC CATHETERIZATION N/A 04/30/2015   Procedure: Left Heart Cath and Coronary Angiography;  Surgeon: Peter M Martinique, MD;  Location: [place] [facility];  Service: Cardiovascular;  Laterality: N/A;  . CHOLECYSTECTOMY  2001  . CYSTOSCOPY W/ RETROGRADES Left  08/02/2015   Procedure: CYSTOSCOPY WITH LEFT RETROGRADE PYELOGRAM;  Surgeon: Festus Aloe, MD;  Location: [facility];  Service: Urology;  Laterality: Left;  . CYSTOSCOPY WITH URETEROSCOPY, STONE BASKETRY AND STENT PLACEMENT Left 08/02/2015   Procedure:  Mitzi Davenport ;  Surgeon: Festus Aloe, MD;  Location: [facility];  Service: Urology;  Laterality: Left;  . CYSTOSCOPY/URETEROSCOPY/HOLMIUM LASER/STENT PLACEMENT Left 08/02/2015   Procedure: CYSTOSCOPY/URETEROSCOPY/HOLMIUM LASER/STENT PLACEMENT-LEFT;  Surgeon: Festus Aloe, MD;  Location: [facility];  Service: Urology;  Laterality: Left;  . HOLMIUM LASER APPLICATION Left [id number]   Procedure: HOLMIUM LASER APPLICATION;  Surgeon: Festus Aloe, MD;  Location: [facility];  Service: Urology;  Laterality: Left;  . REPLACEMENT ASCENDING AORTA N/A 03/03/2014   Procedure: REPAIR OF ASCENDING AORTIC DISSECTION;  Surgeon: Grace Isaac, MD;  Location: [facility];  Service: Open Heart Surgery;  Laterality: N/A;     Medications: Current Meds  Medication Sig  . amLODipine (NORVASC) 10 MG tablet Take 1 tablet by mouth once daily  . aspirin EC 81 MG EC tablet Take 1 tablet (81 mg total) by mouth daily.  [name] atorvastatin (LIPITOR) 40 MG tablet TAKE 1 TABLET BY MOUTH ONCE DAILY AT  6  PM  . carvedilol (COREG) 12.5 MG tablet Take 1 tablet by mouth twice daily  . Cholecalciferol 1000 units tablet Take 1,000 Units by mouth daily.  . diclofenac Sodium (VOLTAREN) 1 % GEL SMARTSIG:4 Gram(s) Topical 4 Times Daily PRN  . folic acid (FOLVITE) 1 MG tablet Take 1 mg by mouth daily.  [name] lisinopril (ZESTRIL) 40 MG tablet Take 1 tablet by mouth once daily  . spironolactone (ALDACTONE) 50 MG tablet Take 1 tablet by mouth once daily  . vitamin E 400 UNIT capsule Take 400 Units by mouth daily.     Allergies: Allergies  Allergen Reactions  . Hydralazine Nausea Only  . Motrin [Ibuprofen] Nausea Only    Upset GI (motrin brand causes the side effect)    Social History: The  patient  reports that she has never smoked. She has never used smokeless tobacco. She reports that she does not drink alcohol or use drugs.   Family History: The patient's family history includes Aortic dissection (age of onset: 68) in her mother; Breast cancer in her sister; Breast cancer (age of onset: 60) in her mother; Colon cancer in her brother; Esophageal cancer in her brother; Hypertension in her daughter, father, mother, and sister; Throat cancer in her brother.   Review of Systems: Please see the history of present illness.   All other systems are reviewed and negative.  Physical Exam: VS:  BP 122/68   Pulse (!) 55   Ht 5\' 3"  (1.6 m)   Wt 200 lb (90.7 kg)   BMI 35.43 kg/m  .  BMI Body mass index is 35.43 kg/m.  Wt Readings from Last 3 Encounters:  02/07/19 200 lb (90.7 kg)  02/02/19 201 lb (91.2 kg)  12/27/18 205 lb (93 kg)    General: Pleasant. Alert and in no acute distress. She has lost some weight.   HEENT: Normal.  Neck: Supple, no JVD, carotid bruits, or masses noted.  Cardiac: Regular rate and rhythm. No murmurs, rubs, or gallops. No edema.  Respiratory:  Lungs are clear to auscultation bilaterally with normal work of breathing.  GI: Soft and nontender.  MS: No deformity or atrophy. Gait and ROM intact.  Skin: Warm and dry. Color is normal.  Neuro:  Strength and sensation are intact and no gross focal deficits noted.  Psych: Alert, appropriate and with normal affect.   LABORATORY DATA:  EKG:  EKG is ordered today. This demonstrates sinus bradycardia - HR is 55. EKG from last week - sinus brady.   Lab Results  Component Value Date   WBC 6.5 02/02/2019   HGB 12.8 02/02/2019   HCT 42.2 02/02/2019   PLT 229 02/02/2019   GLUCOSE 104 (H) 02/02/2019   CHOL 146 09/06/2018   TRIG 61 09/06/2018   HDL 53 09/06/2018   LDLCALC 81 09/06/2018   ALT 14 09/06/2018   AST 12 09/06/2018   NA 137 02/02/2019   K 4.6 02/02/2019   CL 103 02/02/2019   CREATININE 1.33  (H) 02/02/2019   BUN 16 02/02/2019   CO2 26 02/02/2019   TSH 0.968 08/31/2016   INR 0.94 04/26/2015   HGBA1C 6.0 (H) 08/31/2016     BNP (last 3 results) No results for input(s): BNP in the last 8760 hours.  ProBNP (last 3 results) No results for input(s): PROBNP in the last 8760 hours.   Other Studies Reviewed Today:  CTA CHESTIMPRESSION3/2020: No evidence of pulmonary embolism or other acute findings in the chest. The current CTA was timed strictly for pulmonary arterial evaluation and is not adequate for aortic evaluation. The recent CTA on 03/10/2018 was timed for aortic evaluation.   Electronically Signed By: Aletta Edouard M.D. On: 03/15/2018 16:37  Lower Extremity DopplerSummary5/2020:  Right: Findings consistent with continued deep vein thrombosis involving the right peroneal vein. Findings appear essentially unchanged compared to previous examination. No cystic structure found in the popliteal fossa. All other veins visualized appear  fully compressible and demonstrate appropriate Doppler characteristics.  Left: No evidence of common femoral vein obstruction.  *See table(s) above for measurements and observations.  Electronically signed by Larae Grooms MD on 06/09/2018 at 9:34:25 AM.    MyoviewStudy Highlights3/2020    Nuclear stress EF: 57%. No wall motion abnormalities  The left ventricular ejection fraction is normal (55-65%).  There was no ST segment deviation noted during stress.  This is a low risk study. No perfusion defects identified.  The study is normal.   Candee Furbish, MD    ECHOIMPRESSIONS3/2020  1. The left ventricle has normal systolic function with an ejection fraction of 60-65%. The cavity size was normal. There is mildly increased left ventricular wall thickness. Left ventricular diastolic Doppler parameters are consistent with impaired  relaxation. 2. The right ventricle has normal systolic  function. The cavity was normal. There is no increase in right ventricular wall thickness. 3. The tricuspid valve is grossly  normal. 4. The aortic valve is tricuspid Mild thickening of the aortic valve Aortic valve regurgitation is mild by color flow Doppler. 5. Definity used; normal LV systolic function; mild diastolic dysfunction; mild LVH; mildly dilated aortic root; sclerotic aortic valve with mild AI. 6. The interatrial septum is aneurysmal.           Cardiac Cath Procedures 04/2015   Left Heart Cath and Coronary Angiography     Conclusion    Ost RCA to Prox RCA lesion, 30% stenosed.  Mid LAD lesion, 20% stenosed.  The left ventricular systolic function is normal.  1. Mild nonobstructive CAD 2. Normal LV function.  Plan: medical management.     Assessment & Plan:  1. Chest pain - most likely stress related but has had known thoracoabdominal aortic dissection - almost 5 years out  - will update her scan. Further disposition to follow.   2. Peroneal DVT - chronic - not discussed.   3. Significant situational stress - discussed at length - has good family support in place with upcoming events involving the roommate.    4. HTN - BP is fine.   5. HLD - on statin  6. Prior dilated CM - resolved.   7. Non obstructive CAD from cath in 2017 - recent negative troponin - chest pain seems more stress related but given known aortic dissection, will update her scan - further disposition to follow.    8. Iron deficiency anemia - not discussed.  9. Borderline DM - per PCP - not discussed but she has lost a few pounds.   10. COVID-19 Education: The signs and symptoms of COVID-19 were discussed with the patient and how to seek care for testing (follow up with PCP or arrange E-visit).  The importance of social distancing, staying at home, hand hygiene and wearing a mask when out in public were discussed today.  Current medicines are reviewed with the patient  today.  The patient does not have concerns regarding medicines other than what has been noted above.  The following changes have been made:  See above.  Labs/ tests ordered today include:    Orders Placed This Encounter  Procedures  . CT ANGIO CHEST AORTA W/CM & OR WO/CM  . CT Angio Abd/Pel w/ and/or w/o  . EKG 12-Lead     Disposition:   Further disposition to follow.   Patient is agreeable to this plan and will call if any problems develop in the interim.   SignedTruitt Merle, NP  02/07/2019 11:44 AM  [facility] [address] [place], [place]  [address] Phone: [phone] Fax: [phone]

## 2019-02-03 NOTE — Telephone Encounter (Signed)
Patient is calling because she went to the ER yesterday with chest pain. She states that it started on Wednesday but she just took an aspirin and laid down. Yesterday chest pain was still present so she went to the ER and they did a chest x-ray and lab work. Patient left before she was able to see a provider. She denies shortness of breath, nausea, or dizziness. She states that she has been really stressed out lately. She has been taking all of her medications as prescribed. She states that her chest feels tight and when she stands up it is a nagging pressure. She reports that today the pain has improved but she can still feel it a bit. I advised her that if chest pain worsens she needs to go back to the ER and stay until she is seen. Patient is asking if she needs an appointment with Truitt Merle, PA-C who she last saw in December 2020.

## 2019-02-03 NOTE — Telephone Encounter (Signed)
New message:    Patient calling stating that she was in the ER on yesterday and did blood work EKG and x-rays. Patient would like to see the doctor soon. Please call patient back.

## 2019-02-03 NOTE — Telephone Encounter (Signed)
I spoke to the patient and informed her that Alexis Wheeler would see her on 1/26 @ 11:00.  She verbalized understanding.

## 2019-02-03 NOTE — Telephone Encounter (Signed)
I spoke to the patient with Lori's recommendation that if symptoms worsen, she will need to go to the ED for further evaluation and inform them that she had an aneurysm repaired and would need a CT scan.  She verbalized understanding.

## 2019-02-03 NOTE — Telephone Encounter (Signed)
I can see her on Tuesday.   Alexis Wheeler

## 2019-02-07 ENCOUNTER — Other Ambulatory Visit: Payer: Self-pay

## 2019-02-07 ENCOUNTER — Encounter: Payer: Self-pay | Admitting: Nurse Practitioner

## 2019-02-07 ENCOUNTER — Ambulatory Visit (INDEPENDENT_AMBULATORY_CARE_PROVIDER_SITE_OTHER): Payer: Medicare Other | Admitting: Nurse Practitioner

## 2019-02-07 VITALS — BP 122/68 | HR 55 | Ht 63.0 in | Wt 200.0 lb

## 2019-02-07 DIAGNOSIS — Z9889 Other specified postprocedural states: Secondary | ICD-10-CM | POA: Diagnosis not present

## 2019-02-07 DIAGNOSIS — I7103 Dissection of thoracoabdominal aorta: Secondary | ICD-10-CM

## 2019-02-07 DIAGNOSIS — R079 Chest pain, unspecified: Secondary | ICD-10-CM | POA: Diagnosis not present

## 2019-02-07 DIAGNOSIS — I824Y9 Acute embolism and thrombosis of unspecified deep veins of unspecified proximal lower extremity: Secondary | ICD-10-CM | POA: Diagnosis not present

## 2019-02-07 DIAGNOSIS — I2510 Atherosclerotic heart disease of native coronary artery without angina pectoris: Secondary | ICD-10-CM

## 2019-02-07 DIAGNOSIS — I10 Essential (primary) hypertension: Secondary | ICD-10-CM

## 2019-02-07 DIAGNOSIS — E782 Mixed hyperlipidemia: Secondary | ICD-10-CM

## 2019-02-07 DIAGNOSIS — Z7189 Other specified counseling: Secondary | ICD-10-CM

## 2019-02-07 NOTE — Patient Instructions (Addendum)
After Visit Summary:  We will be checking the following labs today - NONE   Medication Instructions:    Continue with your current medicines.    If you need a refill on your cardiac medications before your next appointment, please call your pharmacy.     Testing/Procedures To Be Arranged:  CTA of the chest/aorta/abd/pelvis - to follow up prior dissection - this week.   Follow-Up:   We are going to check on you next week.     At [facility], you and your health needs are our priority.  As part of our continuing mission to provide you with exceptional heart care, we have created designated Provider Care Teams.  These Care Teams include your primary Cardiologist (physician) and Advanced Practice Providers (APPs -  Physician Assistants and Nurse Practitioners) who all work together to provide you with the care you need, when you need it.  Special Instructions:  . Stay safe, stay home, wash your hands for at least 20 seconds and wear a mask when out in public.  . It was good to talk with you today.  . Think about what we talked today.    Call the [place] office at [phone] if you have any questions, problems or concerns.

## 2019-02-09 ENCOUNTER — Ambulatory Visit: Payer: Medicare Other | Admitting: Gastroenterology

## 2019-02-10 ENCOUNTER — Other Ambulatory Visit: Payer: Self-pay

## 2019-02-10 ENCOUNTER — Ambulatory Visit (HOSPITAL_COMMUNITY)
Admission: RE | Admit: 2019-02-10 | Discharge: 2019-02-10 | Disposition: A | Discharge status: Home / Self Care | Payer: Medicare Other | Source: Ambulatory Visit | Attending: Nurse Practitioner | Admitting: Nurse Practitioner

## 2019-02-10 DIAGNOSIS — I7103 Dissection of thoracoabdominal aorta: Secondary | ICD-10-CM | POA: Diagnosis present

## 2019-02-10 MED ORDER — IOHEXOL 350 MG/ML SOLN
100.0000 mL | Freq: Once | INTRAVENOUS | Status: AC | PRN
  Administered 2019-02-10: 100 mL via INTRAVENOUS

## 2019-02-13 ENCOUNTER — Telehealth: Payer: Self-pay | Admitting: *Deleted

## 2019-02-13 NOTE — Telephone Encounter (Signed)
S/w pt per Lori's request.  To let pt know all scans were sent to Dr. Servando Snare to review and pt is safe at this time.  Cecille Rubin will be in touch with pt when Cecille Rubin hears back from [place] after reviewing scans.

## 2019-04-07 ENCOUNTER — Other Ambulatory Visit: Payer: Self-pay

## 2019-04-07 ENCOUNTER — Ambulatory Visit (HOSPITAL_COMMUNITY): Payer: Medicare Other | Attending: Cardiology

## 2019-04-07 DIAGNOSIS — Z9889 Other specified postprocedural states: Secondary | ICD-10-CM | POA: Diagnosis not present

## 2019-04-07 DIAGNOSIS — I7101 Dissection of ascending aorta: Secondary | ICD-10-CM

## 2019-04-10 ENCOUNTER — Telehealth: Payer: Self-pay

## 2019-04-10 DIAGNOSIS — I712 Thoracic aortic aneurysm, without rupture, unspecified: Secondary | ICD-10-CM

## 2019-04-10 NOTE — Telephone Encounter (Signed)
The patient has been notified of the result and verbalized understanding.  All questions (if any) were answered. Antonieta Iba, RN 04/10/2019 3:49 PM

## 2019-04-10 NOTE — Telephone Encounter (Signed)
-----   Message from Sueanne Margarita, MD sent at 04/07/2019  3:05 PM EDT ----- Echo showed normal heart function with moderately thickened and stiff heart muscle, mildly leaky MV and AV moderately thickened AV and stable aortic root repair with mild dilatation at 70mm but 42mm on recent chest CT so will follow with serial chest CT yearly

## 2019-04-12 NOTE — Progress Notes (Deleted)
CARDIOLOGY OFFICE NOTE  Date:  04/17/2019    Alexis Wheeler Date of Birth: 1955-08-01 Medical Record [id number]  PCP:  Nolene Ebbs, MD  Cardiologist:  Servando Snare Turner    No chief complaint on file.   History of Present Illness: Alexis Wheeler is a 65 y.o. female who presents today for a follow up visit. Seen for Dr. Radford Pax.  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 She underwent replacement of the ascending aorta with resuspension of the AV with 30 mm Hemashield Platinum graft with R axillary artery cannulation by Dr. Servando Snare. EF was 40-45% post op. Follow up echo from 07/2014 showed improvement of her EF - now at 55 to 60%.   I have followed her over the past several years - she has had issues with her BP, stopping medicines/mixing up medicines/trouble affording medicines, trouble finding PCP, chronic fatigue/"weak spells" - ended up having Myoview that led to cath - only has very mild CAD. She has had kidney stones. In 2018 she had CTA when admitted with chest pain-- possible small PE versus scarring noted - she did not wish to be on blood thinner. No DVT on doppler study. She was not short of breath and clinically did not fit the picture for a PE.Earlier in 2020 she had lower extremity dopplers due to prior elevated d dimer and has DVT in the peroneal vein on the right. This has persisted and since below the knee and no PE - not on anticoagulation.   When I saw her in August of 2020 - she was doing very well - less "weak spells" - she was only taking Coreg once a day and had stopped Cardura - this helped her feel better.Seen as a work in back in January for chest pain - she had been to the ER - negative Troponin - lots of stress with a female roommate who she was making move out of her home. Her CT needed updating and we did that.    The patient  {does/does not:200015} have symptoms concerning for COVID-19 infection (fever, chills, cough, or new shortness of breath).   Comes in today. Here with   Past Medical History:  Diagnosis Date  . Anemia    childhood  . Arthritis    right knee  . Ascending aortic dissection [facility])    s/p repair by Dr. Servando Snare  . Blood transfusion without reported diagnosis    with heart surgery-repair aorta  . CAD (coronary artery disease)    a. 04/2015: cath showed 30% Ost RCA stenosis, 20% mid-LAD stenosis, and normal LV function.   . Cataract    bilateral eyes, per pt.  . Congestive dilated cardiomyopathy ([place]) 08/01/2014  . GERD   . HEMATURIA UNSPECIFIED   . History of kidney stones   . HYPERLIPIDEMIA   . HYPERTENSION   . Morbid obesity ([place])   . Myocardial infarction ([place])    01/30/2014  . MYOSITIS   . OSA on CPAP 05/10/2017   mild OSA with an AHI of 5.2/hr overall but severe during REM sleep with an AHI of 41.4/hr and underwent CPAP titration to 12cm H2O  . Sleep apnea    cpap-  doesnt use it- last study years ago  . Vertigo     Past Surgical History:  Procedure Laterality Date  . ABDOMINAL  HYSTERECTOMY  1983  . CARDIAC CATHETERIZATION N/A 04/30/2015   Procedure: Left Heart Cath and Coronary Angiography;  Surgeon: Peter M Martinique, MD;  Location: [place] CV LAB;  Service: Cardiovascular;  Laterality: N/A;  . CHOLECYSTECTOMY  2001  . CYSTOSCOPY W/ RETROGRADES Left 08/02/2015   Procedure: CYSTOSCOPY WITH LEFT RETROGRADE PYELOGRAM;  Surgeon: Festus Aloe, MD;  [facility];  Service: Urology;  Laterality: Left;  . CYSTOSCOPY WITH URETEROSCOPY, STONE BASKETRY AND STENT PLACEMENT Left 08/02/2015   Procedure:  Mitzi Davenport ;  Surgeon: Festus Aloe, MD;  [facility];  Service: Urology;  Laterality: Left;  . CYSTOSCOPY/URETEROSCOPY/HOLMIUM LASER/STENT PLACEMENT Left 08/02/2015   Procedure: CYSTOSCOPY/URETEROSCOPY/HOLMIUM LASER/STENT PLACEMENT-LEFT;  Surgeon: Festus Aloe,  MD;  [facility];  Service: Urology;  Laterality: Left;  . HOLMIUM LASER APPLICATION Left [id number]   Procedure: HOLMIUM LASER APPLICATION;  Surgeon: Festus Aloe, MD;  [facility];  Service: Urology;  Laterality: Left;  . REPLACEMENT ASCENDING AORTA N/A 03/03/2014   Procedure: REPAIR OF ASCENDING AORTIC DISSECTION;  Surgeon: Grace Isaac, MD;  Location: [place];  Service: Open Heart Surgery;  Laterality: N/A;     Medications: No outpatient medications have been marked as taking for the 04/18/19 encounter (Appointment) with Burtis Junes, NP.     Allergies: Allergies  Allergen Reactions  . Hydralazine Nausea Only  . Motrin [Ibuprofen] Nausea Only    Upset GI (motrin brand causes the side effect)    Social History: The patient  reports that she has never smoked. She has never used smokeless tobacco. She reports that she does not drink alcohol or use drugs.   Family History: The patient's ***family history includes Aortic dissection (age of onset: 35) in her mother; Breast cancer in her sister; Breast cancer (age of onset: 22) in her mother; Colon cancer in her brother; Esophageal cancer in her brother; Hypertension in her daughter, father, mother, and sister; Throat cancer in her brother.   Review of Systems: Please see the history of present illness.   All other systems are reviewed and negative.   Physical Exam: VS:  There were no vitals taken for this visit. [name]  BMI There is no height or weight on file to calculate BMI.  Wt Readings from Last 3 Encounters:  02/07/19 200 lb (90.7 kg)  02/02/19 201 lb (91.2 kg)  12/27/18 205 lb (93 kg)    General: Pleasant. Well developed, well nourished and in no acute distress.   HEENT: Normal.  Neck: Supple, no JVD, carotid bruits, or masses noted.  Cardiac: ***Regular rate and rhythm. No murmurs, rubs, or gallops. No edema.  Respiratory:  Lungs are clear to auscultation bilaterally with normal work of breathing.  GI:  Soft and nontender.  MS: No deformity or atrophy. Gait and ROM intact.  Skin: Warm and dry. Color is normal.  Neuro:  Strength and sensation are intact and no gross focal deficits noted.  Psych: Alert, appropriate and with normal affect.   LABORATORY DATA:  EKG:  EKG {ACTION; IS/IS [id number] ordered today.  Personally reviewed by me. This demonstrates ***.  Lab Results  Component Value Date   WBC 6.5 02/02/2019   HGB 12.8 02/02/2019   HCT 42.2 02/02/2019   PLT 229 02/02/2019   GLUCOSE 104 (H) 02/02/2019   CHOL 146 09/06/2018   TRIG 61 09/06/2018   HDL 53 09/06/2018   LDLCALC 81 09/06/2018   ALT 14 09/06/2018   AST 12 09/06/2018   NA 137 02/02/2019  K 4.6 02/02/2019   CL 103 02/02/2019   CREATININE 1.33 (H) 02/02/2019   BUN 16 02/02/2019   CO2 26 02/02/2019   TSH 0.968 08/31/2016   INR 0.94 04/26/2015   HGBA1C 6.0 (H) 08/31/2016     BNP (last 3 results) No results for input(s): BNP in the last 8760 hours.  ProBNP (last 3 results) No results for input(s): PROBNP in the last 8760 hours.   Other Studies Reviewed Today:  CTA CHEST/ABD/PELVIS IMPRESSION 01/2019: 1. Overall, essentially stable chronic residual Stanford type A thoracoabdominal aortic dissection status post open surgical repair of the tubular portion of the ascending thoracic aorta. There may be minimal increase in dilation of the proximal descending thoracic aorta with a maximal diameter of 3.9 cm today compared to 3.7 cm previously. No evidence of propagation of the dissection flap, or complication at the site of surgical repair. 2. 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 3. Diffuse hepatic steatosis steatosis. Trace pelvic ascites may reflect developing cirrhosis/portal hypertension. 4. Additional ancillary findings as above without significant interval  change.  Signed,  Criselda Peaches, MD, [place]  [facility]  [facility]   Electronically Signed   By: Jacqulynn Cadet M.D.   On: 02/10/2019 11:27   Lower Extremity DopplerSummary5/2020:  Right: Findings consistent with continued deep vein thrombosis involving the right peroneal vein. Findings appear essentially unchanged compared to previous examination. No cystic structure found in the popliteal fossa. All other veins visualized appear  fully compressible and demonstrate appropriate Doppler characteristics.  Left: No evidence of common femoral vein obstruction.  *See table(s) above for measurements and observations.  Electronically signed by Larae Grooms MD on 06/09/2018 at 9:34:25 AM.    MyoviewStudy Highlights3/2020    Nuclear stress EF: 57%. No wall motion abnormalities  The left ventricular ejection fraction is normal (55-65%).  There was no ST segment deviation noted during stress.  This is a low risk study. No perfusion defects identified.  The study is normal.   Candee Furbish, MD    ECHOIMPRESSIONS3/2020  1. The left ventricle has normal systolic function with an ejection fraction of 60-65%. The cavity size was normal. There is mildly increased left ventricular wall thickness. Left ventricular diastolic Doppler parameters are consistent with impaired  relaxation. 2. The right ventricle has normal systolic function. The cavity was normal. There is no increase in right ventricular wall thickness. 3. The tricuspid valve is grossly normal. 4. The aortic valve is tricuspid Mild thickening of the aortic valve Aortic valve regurgitation is mild by color flow Doppler. 5. Definity used; normal LV systolic function; mild diastolic dysfunction; mild LVH; mildly dilated aortic root; sclerotic aortic valve with mild AI. 6. The interatrial septum is aneurysmal.            Cardiac Cath Procedures 04/2015   Left Heart Cath and Coronary Angiography     Conclusion    Ost RCA to Prox RCA lesion, 30% stenosed.  Mid LAD lesion, 20% stenosed.  The left ventricular systolic function is normal.  1. Mild nonobstructive CAD 2. Normal LV function.  Plan: medical management.     Assessment & Plan:  1. Chest pain - most likely stress related but has had known thoracoabdominal aortic dissection - almost 5 years out  - will update her scan. Further disposition to follow.   2. Peroneal DVT - chronic -  not discussed.   3. Significant situational stress - discussed at length - has good family support in place with upcoming events involving the roommate.    4. HTN - BP is fine.   5. HLD - on statin  6. Prior dilated CM - resolved.   7. Non obstructive CAD from cath in 2017 - recent negative troponin - chest pain seems more stress related but given known aortic dissection, will update her scan - further disposition to follow.   8. Iron deficiency anemia -not discussed.  9. Borderline DM - per PCP - not discussed but she has lost a few pounds.   [name] COVID-19 Education: The signs and symptoms of COVID-19 were discussed with the patient and how to seek care for testing (follow up with PCP or arrange E-visit).  The importance of social distancing, staying at home, hand hygiene and wearing a mask when out in public were discussed today.  Current medicines are reviewed with the patient today.  The patient does not have concerns regarding medicines other than what has been noted above.  The following changes have been made:  See above.  Labs/ tests ordered today include:   No orders of the defined types were placed in this encounter.    Disposition:   FU with *** in {gen number [id number] {Days to years:10300}.   Patient is agreeable to this plan and will call if any problems develop in the interim.   SignedTruitt Merle, NP   04/17/2019 7:56 AM  [facility] [address] [place], [place]  [address] Phone: [phone] Fax: [phone]

## 2019-04-18 ENCOUNTER — Ambulatory Visit: Payer: Medicare Other | Admitting: Nurse Practitioner

## 2019-05-11 ENCOUNTER — Telehealth: Payer: Self-pay | Admitting: Gastroenterology

## 2019-05-11 ENCOUNTER — Other Ambulatory Visit: Payer: Self-pay

## 2019-05-11 MED ORDER — DICYCLOMINE HCL 20 MG PO TABS: 20.0000 mg | ORAL_TABLET | Freq: Three times a day (TID) | ORAL | 0 refills | Status: DC | PRN

## 2019-05-11 NOTE — Telephone Encounter (Signed)
She is taking Miralax on a PRN basis. She has had a good bowel movement. States her stomach has been "cranky." And "just doesn't feel good." She has follow up scheduled. Last seen on office on 06/10/2017. She is asking for a refill of dicyclomine. Please advise if this is okay. Thanks

## 2019-05-11 NOTE — Telephone Encounter (Signed)
Ok to send refill for Dicyclomine and follow up in office visit. Thank you

## 2019-05-11 NOTE — Telephone Encounter (Signed)
Pt is requesting a prescription for her IBS, she states that her stomach has been bothering her a lot lately. She has an appt on 5/24 with Dr. Silverio Decamp but would like something in the meantime.

## 2019-05-11 NOTE — Telephone Encounter (Signed)
Patient notified

## 2019-05-22 ENCOUNTER — Other Ambulatory Visit: Payer: Medicare Other

## 2019-05-23 DIAGNOSIS — K589 Irritable bowel syndrome without diarrhea: Secondary | ICD-10-CM | POA: Insufficient documentation

## 2019-06-05 ENCOUNTER — Encounter: Payer: Self-pay | Admitting: Gastroenterology

## 2019-06-05 ENCOUNTER — Ambulatory Visit: Payer: Medicare Other | Admitting: Gastroenterology

## 2019-06-05 VITALS — BP 140/70 | HR 64 | Ht 62.25 in | Wt 202.0 lb

## 2019-06-05 DIAGNOSIS — R1013 Epigastric pain: Secondary | ICD-10-CM | POA: Diagnosis not present

## 2019-06-05 DIAGNOSIS — K582 Mixed irritable bowel syndrome: Secondary | ICD-10-CM

## 2019-06-05 MED ORDER — COLESEVELAM HCL 625 MG PO TABS: ORAL_TABLET | ORAL | 6 refills | Status: DC

## 2019-06-05 MED ORDER — DICYCLOMINE HCL 20 MG PO TABS: 20.0000 mg | ORAL_TABLET | Freq: Three times a day (TID) | ORAL | 0 refills | Status: DC | PRN

## 2019-06-05 NOTE — Patient Instructions (Addendum)
We have sent the following medications to your pharmacy for you to pick up at your convenience:  Bentyl, Welchol  Take the [place] within 2-3 hours of other medications to avoid interactions.  Take Benefiber 1 tablespoon 2-3 times daily with meals.  Take FD Gard, 1 capsule three times a day as needed.  Lactose free diet  Avoid soda, high fructose corn syrup, and artificial sweeteners  Please follow up on 818/2021 at 9:50am

## 2019-06-05 NOTE — Progress Notes (Signed)
Alexis Wheeler    [id number]    Jan 08, 1956  Primary Care Physician:Avbuere, Christean Grief, MD  Referring Physician: Nolene Ebbs, [address] [place],  [place] [address]   Chief complaint: Constipation alternating with diarrhea HPI:  64 year old very pleasant female with history of lactic dissection status post repair in 2016, hypertension and hyperlipidemia here for follow-up visit for alternating constipation and diarrhea She is currently not on any laxatives.  Has intermittent abdominal bloating and lower abdominal cramping Denies any dark stool, melena or rectal bleeding No loss of appetite or weight loss Colonoscopy February 08, 2017: 4 mm polyp removed from sigmoid colon, small internal hemorrhoids otherwise normal exam.  Pathology polyp removed was benign colorectal mucosa with no adenomatous tissue.  Outpatient Encounter Medications as of 06/05/2019  Medication Sig  . amLODipine (NORVASC) 10 MG tablet Take 1 tablet by mouth once daily  . aspirin EC 81 MG EC tablet Take 1 tablet (81 mg total) by mouth daily.  [name] atorvastatin (LIPITOR) 40 MG tablet TAKE 1 TABLET BY MOUTH ONCE DAILY AT  6  PM  . carvedilol (COREG) 12.5 MG tablet Take 1 tablet by mouth twice daily  . Cholecalciferol 1000 units tablet Take 1,000 Units by mouth daily.  . diclofenac Sodium (VOLTAREN) 1 % GEL SMARTSIG:4 Gram(s) Topical 4 Times Daily PRN  . dicyclomine (BENTYL) 20 MG tablet Take 1 tablet (20 mg total) by mouth 3 (three) times daily as needed for spasms.  . folic acid (FOLVITE) 1 MG tablet Take 1 mg by mouth daily.  [name] lisinopril (ZESTRIL) 40 MG tablet Take 1 tablet by mouth once daily  . spironolactone (ALDACTONE) 50 MG tablet Take 1 tablet by mouth once daily  . vitamin E 400 UNIT capsule Take 400 Units by mouth daily.   No facility-administered encounter medications on file as of 06/05/2019.    Allergies as of 06/05/2019 - Review Complete 06/05/2019  Allergen Reaction Noted  .  Hydralazine Nausea Only 04/16/2015  . Motrin [ibuprofen] Nausea Only     Past Medical History:  Diagnosis Date  . Anemia    childhood  . Arthritis    right knee  . Ascending aortic dissection [facility])    s/p repair by Dr. Servando Snare  . Blood transfusion without reported diagnosis    with heart surgery-repair aorta  . CAD (coronary artery disease)    a. 04/2015: cath showed 30% Ost RCA stenosis, 20% mid-LAD stenosis, and normal LV function.   . Cataract    bilateral eyes, per pt.  . Congestive dilated cardiomyopathy ([place]) 08/01/2014  . DVT (deep venous thrombosis) ([place])    right  . GERD   . HEMATURIA UNSPECIFIED   . History of kidney stones   . HYPERLIPIDEMIA   . HYPERTENSION   . Morbid obesity ([place])   . Myocardial infarction ([place])    01/30/2014  . MYOSITIS   . OSA on CPAP 05/10/2017   mild OSA with an AHI of 5.2/hr overall but severe during REM sleep with an AHI of 41.4/hr and underwent CPAP titration to 12cm H2O  . Sleep apnea    cpap-  doesnt use it- last study years ago  . Vertigo     Past Surgical History:  Procedure Laterality Date  . ABDOMINAL HYSTERECTOMY  1983  . CARDIAC CATHETERIZATION N/A 04/30/2015   Procedure: Left Heart Cath and Coronary Angiography;  Surgeon: Peter M Martinique, MD;  Location: [place] [facility];  Service:  Cardiovascular;  Laterality: N/A;  . CHOLECYSTECTOMY  2001  . CYSTOSCOPY W/ RETROGRADES Left 08/02/2015   Procedure: CYSTOSCOPY WITH LEFT RETROGRADE PYELOGRAM;  Surgeon: Festus Aloe, MD;  [facility];  Service: Urology;  Laterality: Left;  . CYSTOSCOPY WITH URETEROSCOPY, STONE BASKETRY AND STENT PLACEMENT Left 08/02/2015   Procedure:  Mitzi Davenport ;  Surgeon: Festus Aloe, MD;  [facility];  Service: Urology;  Laterality: Left;  . CYSTOSCOPY/URETEROSCOPY/HOLMIUM LASER/STENT PLACEMENT Left 08/02/2015   Procedure: CYSTOSCOPY/URETEROSCOPY/HOLMIUM LASER/STENT PLACEMENT-LEFT;  Surgeon: Festus Aloe, MD;  [facility];  Service:  Urology;  Laterality: Left;  . HOLMIUM LASER APPLICATION Left [id number]   Procedure: HOLMIUM LASER APPLICATION;  Surgeon: Festus Aloe, MD;  [facility];  Service: Urology;  Laterality: Left;  . REPLACEMENT ASCENDING AORTA N/A 03/03/2014   Procedure: REPAIR OF ASCENDING AORTIC DISSECTION;  Surgeon: Grace Isaac, MD;  Location: [place];  Service: Open Heart Surgery;  Laterality: N/A;    Family History  Problem Relation Age of Onset  . Breast cancer Mother 63  . Aortic dissection Mother 25  . Hypertension Mother   . Throat cancer Brother        1 bro living  . Colon cancer Brother   . Esophageal cancer Brother   . Hypertension Father   . Breast cancer Sister   . Hypertension Sister   . Hypertension Daughter   . Seizures Neg Hx   . Liver cancer Neg Hx   . Pancreatic cancer Neg Hx   . Rectal cancer Neg Hx   . Stomach cancer Neg Hx     Social History   Socioeconomic History  . Marital status: Divorced    Spouse name: Not on file  . Number of children: 1  . Years of education: Not on file  . Highest education level: Not on file  Occupational History  . Not on file  Tobacco Use  . Smoking status: Never Smoker  . Smokeless tobacco: Never Used  . 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 Alcohol use: No  . Drug use: No  . Sexual activity: Never  Other Topics Concern  . Not on file  Social History Narrative  . Not on file   Social Determinants of Health   Financial Resource Strain:   . Difficulty of Paying Living Expenses:   Food Insecurity:   . Worried About Charity fundraiser in the Last Year:   . Arboriculturist in the Last Year:   Transportation Needs:   . Film/video editor (Medical):   [name] Lack of Transportation (Non-Medical):   Physical Activity:   . Days of Exercise per Week:   . Minutes of Exercise per Session:   Stress:   . Feeling of  Stress :   Social Connections:   . Frequency of Communication with Friends and Family:   . Frequency of Social Gatherings with Friends and Family:   . Attends Religious Services:   . Active Member of Clubs or Organizations:   . Attends Archivist Meetings:   [name] Marital Status:   Intimate Partner Violence:   . Fear of Current or Ex-Partner:   . Emotionally Abused:   [name] Physically Abused:   . Sexually Abused:       Review of systems:  All other review of systems negative except as mentioned in the HPI.  Physical Exam: Vitals:   06/05/19 0943  BP: 140/70  Pulse: 64   Body mass index is 36.65 kg/m. Gen:      No acute distress Abd:      soft, non-tender; no palpable masses, no distension Ext:    No edema; adequate peripheral perfusion Neuro: alert and oriented x 3 Psych: normal mood and affect  Data Reviewed:  Reviewed labs, radiology imaging, old records and pertinent past GI work up   Assessment and Plan/Recommendations:  64 year old very pleasant female with history of obesity, hypertension, hyperlipidemia, Idec dissection s/p repair in 2016 with chronic irritable bowel syndrome alternating diarrhea and constipation  IBS with alternating constipation and diarrhea: S/p cholecystectomy, possible bile salt induced diarrhea Start WelChol twice daily, advised patient to avoid taking it within 3 to 4 hours of other medication to avoid drug interaction Benefiber 1 tablespoon 2-3 times daily with meals Lactose-free diet Avoid artificial sweeteners, soda and high fructose corn syrup  Dyspepsia: Trial of FD guard 1 capsule up to 3 times daily as needed  Return in 3 months or sooner if needed   The patient was provided an opportunity to ask questions and all were answered. The patient agreed with the plan and demonstrated an understanding of the instructions.  Damaris Hippo , MD    CC: Nolene Ebbs, MD

## 2019-06-13 ENCOUNTER — Encounter: Payer: Self-pay | Admitting: Gastroenterology

## 2019-08-30 ENCOUNTER — Ambulatory Visit: Payer: Medicare Other | Admitting: Gastroenterology

## 2019-09-04 ENCOUNTER — Other Ambulatory Visit: Payer: Self-pay

## 2019-09-04 ENCOUNTER — Other Ambulatory Visit: Payer: Self-pay | Admitting: Cardiothoracic Surgery

## 2019-09-04 DIAGNOSIS — I7101 Dissection of ascending aorta: Secondary | ICD-10-CM

## 2019-09-04 MED ORDER — AMLODIPINE BESYLATE 10 MG PO TABS: 10.0000 mg | ORAL_TABLET | Freq: Every day | ORAL | 0 refills | Status: DC

## 2019-09-04 MED ORDER — LISINOPRIL 40 MG PO TABS: 40.0000 mg | ORAL_TABLET | Freq: Every day | ORAL | 0 refills | Status: DC

## 2019-10-12 ENCOUNTER — Other Ambulatory Visit: Payer: Medicare Other

## 2019-10-16 ENCOUNTER — Encounter: Payer: Self-pay | Admitting: Physician Assistant

## 2019-10-16 ENCOUNTER — Ambulatory Visit (INDEPENDENT_AMBULATORY_CARE_PROVIDER_SITE_OTHER): Payer: Medicare Other | Admitting: Physician Assistant

## 2019-10-16 VITALS — BP 122/70 | HR 70 | Ht 63.0 in | Wt 203.2 lb

## 2019-10-16 DIAGNOSIS — K589 Irritable bowel syndrome without diarrhea: Secondary | ICD-10-CM

## 2019-10-16 MED ORDER — DICYCLOMINE HCL 20 MG PO TABS: 20.0000 mg | ORAL_TABLET | Freq: Three times a day (TID) | ORAL | 11 refills | Status: DC | PRN

## 2019-10-16 NOTE — Progress Notes (Signed)
Subjective:    Patient ID: Alexis Wheeler, female    DOB: 10-08-1955, 64 y.o.   MRN: [id number]  HPI Alexis Wheeler is a pleasant 64 year old African-American female, established with Dr. Silverio Decamp with history of IBS with alternating bowel habits who comes in today after a recent bout with diarrhea. She was last seen in May 2021 and at that time was on a regimen of FD guard, Benefiber daily and was to try WelChol 625 mg twice daily as it was felt she may have a component of bile salt induced diarrhea status post cholecystectomy. She last had colonoscopy in January 2019 showed a 4 mm polyp in the sigmoid colon removed which by path was benign colonic tissue and noted to have small internal hemorrhoids. Other history includes coronary artery disease, hypertension, sleep apnea, obesity and is status post abdominal aortic dissection repair in 2016. Patient also uses dicyclomine as needed. She said she had been having some problems with diarrhea a couple of times per day.  Since she made the visit she has now switched back to constipation.  She had had one episode of diarrhea that lasted for over 24 hours and then resolved.  She was now going a couple of days between bowel movements and complains of fullness and bloating. On further questioning she has been off of Benefiber and does not recall ever taking the WelChol.  She denies any abdominal pain, no melena or hematochezia. On further questioning family history Brother deceased secondary to prostate versus colon cancer.   Review of Systems Pertinent positive and negative review of systems were noted in the above HPI section.  All other review of systems was otherwise negative.  Outpatient Encounter Medications as of 10/16/2019  Medication Sig  . amLODipine (NORVASC) 10 MG tablet Take 1 tablet (10 mg total) by mouth daily.  [name] aspirin EC 81 MG EC tablet Take 1 tablet (81 mg total) by mouth daily.  [name] atorvastatin (LIPITOR) 40 MG tablet TAKE 1 TABLET BY  MOUTH ONCE DAILY AT  6  PM  . carvedilol (COREG) 12.5 MG tablet Take 1 tablet by mouth twice daily  . Cholecalciferol 1000 units tablet Take 1,000 Units by mouth daily.  . colesevelam (WELCHOL) 625 MG tablet Take one tablet before lunch and bedtime.  Avoid taking within 2-3 hours of other medications to prevent interaction  . diclofenac Sodium (VOLTAREN) 1 % GEL SMARTSIG:4 Gram(s) Topical 4 Times Daily PRN  . dicyclomine (BENTYL) 20 MG tablet Take 1 tablet (20 mg total) by mouth 3 (three) times daily as needed for spasms.  . folic acid (FOLVITE) 1 MG tablet Take 1 mg by mouth daily.  [name] lisinopril (ZESTRIL) 40 MG tablet Take 1 tablet (40 mg total) by mouth daily.  [name] spironolactone (ALDACTONE) 50 MG tablet Take 1 tablet by mouth once daily  . vitamin E 400 UNIT capsule Take 400 Units by mouth daily.  . [DISCONTINUED] dicyclomine (BENTYL) 20 MG tablet Take 1 tablet (20 mg total) by mouth 3 (three) times daily as needed for spasms.   No facility-administered encounter medications on file as of 10/16/2019.   Allergies  Allergen Reactions  . Hydralazine Nausea Only  . Motrin [Ibuprofen] Nausea Only    Upset GI (motrin brand causes the side effect)   Patient Active Problem List   Diagnosis Date Noted  . Ascending aortic dissection ([place])   . OSA on CPAP 05/10/2017  . Benign essential HTN 05/10/2017  . Coronary artery disease 03/08/2016  . Diastolic  dysfunction without heart failure 03/08/2016  . Chest pain 03/07/2016  . Fatigue 04/30/2015  . Hypokalemia 12/20/2014  . 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 Dilated cardiomyopathy ([place]) 08/01/2014  . Plantar fasciitis of left foot 05/02/2014  . Non-compliant behavior 03/27/2014  . S/P aortic dissection repair 03/03/2014  . Lumbosacral spondylosis without myelopathy 01/26/2014  . Greater trochanteric bursitis of right hip 10/10/2013  . Chronic low back pain 09/26/2013  . Chronic right hip pain 09/26/2013  . Allergic rhinitis  08/29/2012  . Kidney stone 12/31/2010  . Morbid obesity ([place]) 12/18/2009  . GERD 12/18/2009  . HEMATURIA UNSPECIFIED 12/17/2009  . Hyperlipidemia with target LDL less than 70 12/16/2009  . Hypertensive heart disease 12/16/2009   Social History   Socioeconomic History  . Marital status: Divorced    Spouse name: Not on file  . Number of children: 1  . Years of education: Not on file  . Highest education level: Not on file  Occupational History  . Not on file  Tobacco Use  . Smoking status: Never Smoker  . Smokeless tobacco: Never Used  . 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 Vaping Use: Never used  Substance and Sexual Activity  . Alcohol use: No  . Drug use: No  . Sexual activity: Never  Other Topics Concern  . Not on file  Social History Narrative  . Not on file   Social Determinants of Health   Financial Resource Strain:   . Difficulty of Paying Living Expenses: Not on file  Food Insecurity:   . Worried About Charity fundraiser in the Last Year: Not on file  . Ran Out of Food in the Last Year: Not on file  Transportation Needs:   . Lack of Transportation (Medical): Not on file  . Lack of Transportation (Non-Medical): Not on file  Physical Activity:   . Days of Exercise per Week: Not on file  . Minutes of Exercise per Session: Not on file  Stress:   . Feeling of Stress : Not on file  Social Connections:   . Frequency of Communication with Friends and Family: Not on file  . Frequency of Social Gatherings with Friends and Family: Not on file  . Attends Religious Services: Not on file  . Active Member of Clubs or Organizations: Not on file  . Attends Archivist Meetings: Not on file  . Marital Status: Not on file  Intimate Partner Violence:   . Fear of Current or Ex-Partner: Not on file  . Emotionally Abused: Not on file  . Physically Abused: Not on file  .  Sexually Abused: Not on file    Alexis Wheeler's family history includes Aortic dissection (age of onset: 48) in her mother; Breast cancer in her sister; Breast cancer (age of onset: 66) in her mother; Colon cancer in her brother; Esophageal cancer in her brother; Hypertension in her daughter, father, mother, and sister; Throat cancer in her brother.      Objective:    Vitals:   10/16/19 0843  BP: 122/70  Pulse: 70    Physical Exam Well-developed well-nourished older African-American female in no acute distress.  Height, Weight, 203 BMI 36.0  HEENT; nontraumatic normocephalic, EOMI, PE RR LA, sclera anicteric. Oropharynx; not examined Neck; supple, no JVD Cardiovascular; regular rate and rhythm with S1-S2, no murmur rub or gallop Pulmonary; Clear bilaterally Abdomen; soft,  obese nontender, nondistended, no palpable mass or hepatosplenomegaly, bowel sounds are active Rectal; not done today Skin; benign exam, no jaundice rash or appreciable lesions Extremities; no clubbing cyanosis or edema skin warm and dry Neuro/Psych; alert and oriented x4, grossly nonfocal mood and affect appropriate       Assessment & Plan:    #64  64 year old African-American female with history of IBS alternating constipation diarrhea. Patient had been having some issues with diarrhea which have resolved and she is currently having more difficulty with constipation which is mild. #2 colon cancer screening-up-to-date with negative colonoscopy 2019 3.  Coronary artery disease 4.  Sleep apnea 5.  Hypertension 6.  Obesity 7.  Status post abdominal aortic dissection repair 2016  Plan Patient will start back on Benefiber 1 scoop daily in a glass of water. We discussed use of MiraLAX 17 g in 8 ounces of water daily or every other day which she may titrate for constipation. Patient did not initiate WelChol, will leave off. Continue dicyclomine 10 mg every 6 hours as needed for abdominal cramping/spasm, refill  sent. Patient will follow up in 1 year or sooner as needed.   Alexis Wheeler S Katalia Choma PA-C 10/16/2019   Cc: Nolene Ebbs, MD

## 2019-10-16 NOTE — Patient Instructions (Addendum)
If you are age 65 or older, your body mass index should be between 23-30. Your Body mass index is 36 kg/m. If this is out of the aforementioned range listed, please consider follow up with your Primary Care Provider.  If you are age 67 or younger, your body mass index should be between 19-25. Your Body mass index is 36 kg/m. If this is out of the aformentioned range listed, please consider follow up with your Primary Care Provider.   Refills of dicyclomine have been sent to your pharmacy.  Restart daily Benefiber.  Use Miralax as needed.  Drink 60 ounces of water as needed.  Follow up in 1 year with Dr. Silverio Decamp or as needed.

## 2019-10-19 ENCOUNTER — Ambulatory Visit: Payer: Medicare Other | Admitting: Cardiothoracic Surgery

## 2019-10-19 ENCOUNTER — Other Ambulatory Visit: Payer: Medicare Other

## 2019-10-19 NOTE — Progress Notes (Deleted)
[address]       [place], [address]             [phone]      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 SURGICAL PROCEDURES: 1. Replacement of ascending aorta. 2. Resuspension of aortic valve  3. Right axillary artery cannulation and hypothermic circulatory circulatory arrest. SURGEON: Lanelle Bal, MD.  History of Present Illness:   Patient returns to the office today in follow-up after resuspension of her aortic valve and ascending aorta for type I aortic dissection done March 02, 2014.  Early postoperative she had significant problems with blood pressure control.  She notes that her blood pressures much better controlled now.   Her major complaint now is fatigue She denies any specific shortness of breath or chest discomfort but does fatigue with increased activity .   Recently she is seeing Dr. Radford Pax was found to have a superficial venous thrombosis in the right leg a stress test that has not been resulted yet, follow-up CTA of the abdomen and pelvis and also a CTA of the chest to rule out PE   Past Medical History:  Diagnosis Date  . Anemia    childhood  . Arthritis    right knee  . Ascending aortic dissection [facility])    s/p repair by Dr. Servando Snare  . Blood transfusion without reported diagnosis    with heart surgery-repair aorta  . CAD (coronary artery disease)    a. 04/2015: cath showed 30% Ost RCA stenosis, 20% mid-LAD stenosis, and normal LV function.   . Cataract    bilateral eyes, per pt.  . Congestive dilated cardiomyopathy ([place]) 08/01/2014  . DVT (deep venous  thrombosis) ([place])    right  . GERD   . HEMATURIA UNSPECIFIED   . History of kidney stones   . HYPERLIPIDEMIA   . HYPERTENSION   . Morbid obesity ([place])   . Myocardial infarction ([place])    01/30/2014  . MYOSITIS   . OSA on CPAP 05/10/2017   mild OSA with an AHI of 5.2/hr overall but severe during REM sleep with an AHI of 41.4/hr and underwent CPAP titration to 12cm H2O  . Sleep apnea    cpap-  doesnt use it- last study years ago  . 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 Hydralazine Nausea Only  . Motrin [Ibuprofen] Nausea Only    Upset GI (motrin brand causes the side effect)    Current Outpatient Medications  Medication Sig Dispense Refill  . amLODipine (NORVASC) 10 MG tablet Take 1 tablet (10 mg total) by mouth daily. 90 tablet 0  . aspirin EC 81 MG EC tablet Take 1 tablet (81 mg total) by mouth daily.    [name] atorvastatin (LIPITOR) 40 MG tablet TAKE 1 TABLET BY MOUTH ONCE DAILY AT  6  PM 90 tablet 3  . carvedilol (COREG) 12.5 MG tablet Take 1 tablet by mouth twice daily 180 tablet 2  . Cholecalciferol 1000 units tablet Take 1,000 Units by mouth daily.    . colesevelam (WELCHOL) 625 MG tablet Take one tablet before lunch and bedtime.  Avoid taking within 2-3 hours of other medications to prevent interaction 60 tablet 6  . diclofenac Sodium (VOLTAREN) 1 % GEL SMARTSIG:4 Gram(s) Topical 4 Times Daily PRN    . dicyclomine (BENTYL) 20 MG tablet Take 1 tablet (20 mg total) by mouth 3 (three) times daily as needed for spasms. 60 tablet 11  . folic acid (FOLVITE) 1 MG tablet Take 1 mg by mouth daily.    [name] lisinopril (ZESTRIL) 40 MG tablet Take 1 tablet (40 mg total) by mouth daily. 90 tablet 0  .  spironolactone (ALDACTONE) 50 MG tablet Take 1 tablet by mouth once daily 90 tablet 3  . vitamin E 400 UNIT capsule Take 400 Units by mouth daily.     No current facility-administered medications for this visit.    Review of Systems  Constitutional: Positive for malaise/fatigue. Negative for chills, diaphoresis, fever and weight loss.  Cardiovascular: Positive for leg swelling. Negative for chest pain, palpitations, orthopnea, claudication and PND.    Physical Exam: There were no vitals taken for this visit. {physical exam:21449}  Diagnostic Studies & Laboratory data:     Recent Radiology Findings:  No results found. Dg Chest 2 View  Result Date: 03/09/2018 CLINICAL DATA:  Chest pain EXAM: CHEST - 2 VIEW COMPARISON:  07/09/2016 FINDINGS: The heart is normal in size status post median sternotomy. Both lungs are clear. The visualized skeletal structures are unremarkable. IMPRESSION: No acute abnormality of the lungs.  No focal airspace opacity. Electronically Signed   By: Eddie Candle M.D.   On: 03/09/2018 16:57   Ct Angio Chest Pe W Or Wo Contrast  Result Date: 03/15/2018 CLINICAL DATA:  Lower extremity venous duplex ultrasound today demonstrated right peroneal vein DVT. Recent complaint of chest pain and history of prior aortic dissection with replacement of ascending thoracic aorta in 2016. Recent assessment the thoracic aorta by CTA on 03/10/2018. That study was not timed for pulmonary artery evaluation. EXAM: CT ANGIOGRAPHY CHEST WITH CONTRAST TECHNIQUE: Multidetector CT imaging of the chest was performed using the standard protocol during bolus administration of intravenous contrast. Multiplanar CT image reconstructions and MIPs were obtained to evaluate the vascular anatomy. CONTRAST:  70 mL ISOVUE-370 IV COMPARISON:  CTA of the chest on 03/10/2018 FINDINGS: Cardiovascular: The current study demonstrates excellent opacification of the right heart and pulmonary arteries. There is no  evidence of pulmonary embolism. The heart size is stable and at the upper limits of normal. No evidence of pericardial fluid. The thoracic aorta is not opacified and therefore the residual dissection and replaced ascending thoracic aorta can not be assessed. Central pulmonary arteries are normal in caliber. Mediastinum/Nodes: No enlarged mediastinal, hilar, or axillary lymph nodes. Thyroid gland, trachea, and esophagus demonstrate no significant findings. Lungs/Pleura: There is no evidence of pulmonary edema, consolidation, pneumothorax, nodule or pleural fluid. Upper Abdomen: Differential density in the abdominal aorta with higher density in the anterior true lumen of the abdominal aorta at the level of chronic dissection. Musculoskeletal: No chest wall abnormality. No acute or significant osseous findings. Review of the MIP images confirms the above findings. IMPRESSION: No evidence of pulmonary embolism or other acute findings in the chest. The current CTA was timed strictly for  pulmonary arterial evaluation and is not adequate for aortic evaluation. The recent CTA on 03/10/2018 was timed for aortic evaluation. Electronically Signed   By: Aletta Edouard M.D.   On: 03/15/2018 16:37   Ct Angio Chest/abd/pel For Dissection W And/or W/wo  Result Date: 03/10/2018 CLINICAL DATA:  Chest pain.  History of aortic dissection. EXAM: CT ANGIOGRAPHY CHEST, ABDOMEN AND PELVIS TECHNIQUE: Multidetector CT imaging through the chest, abdomen and pelvis was performed using the standard protocol during bolus administration of intravenous contrast. Multiplanar reconstructed images and MIPs were obtained and reviewed to evaluate the vascular anatomy. CONTRAST:  181mL ISOVUE-370 IOPAMIDOL (ISOVUE-370) INJECTION 76% COMPARISON:  04/08/2017 FINDINGS: CTA CHEST FINDINGS Cardiovascular: Graft repair of the ascending aorta, beginning just above the valve to the proximal arch is stable in appearance. There is no evidence of  pseudoaneurysm formation. Maximal diameter of the aortic root is 4.3 cm. This is not significantly changed. The dissection begins in the proximal aortic arch and extends into the abdominal aorta. There is no evidence of acute intramural hematoma. Stable extension of of the dissection into the innominate artery which terminates before the bifurcation. Left common carotid artery and left subclavian artery emanates from the true lumen. The true and false lumen within the aorta are patent. Right vertebral artery is patent. Left vertebral artery is obscured by venous collaterals. No evidence of significant coronary artery calcifications. Maximal diameter of the aorta in the arch and proximal descending thoracic aorta are 3.5 and 3.8 cm respectively. These measurements are not significantly changed. Mediastinum/Nodes: No abnormal mediastinal adenopathy or pericardial effusion. Unremarkable esophagus. Multiple thyroid hypodensities are not significantly changed. Lungs/Pleura: No pneumothorax.  No pleural effusion.  Clear lungs. Musculoskeletal: No vertebral compression deformity. Review of the MIP images confirms the above findings. CTA ABDOMEN AND PELVIS FINDINGS VASCULAR Aorta: The aortic dissection continues throughout the abdominal aorta, terminating just above the bifurcation. This is stable. Maximal diameter of the abdominal aorta in the upper abdomen is 2.9 cm compared with 3.3 cm on the prior study. The true and false lumen both opacified with contrast. Mixing artifact in the false lumen is present. Celiac: Patent.  Emanating from the true lumen. SMA: Patent.  Emanating from the true lumen. Renals: Single right renal artery emanating from the true lumen is patent. Single left renal artery emanates from the false lumen and is patent. IMA: Patent.  Emanates from the true lumen. Inflow: Bilateral common, internal, and external iliac arteries are patent. There is no evidence of extension of the dissection into the  iliac arterial system. Mild atherosclerotic calcifications are noted. Review of the MIP images confirms the above findings. NON-VASCULAR Hepatobiliary: Diffuse hepatic steatosis.  Postcholecystectomy. Pancreas: Unremarkable. Spleen: Unremarkable Adrenals/Urinary Tract: There is poor parenchymal enhancement of the upper half of the left kidney on arterial phase imaging. This is associated with atrophy of the left kidney. These findings suggest an element of arterial insufficiency to the left kidney and these findings are not significantly changed. Right kidney and adrenal glands are within normal limits. 1.9 cm left adrenal nodule is stable. Bladder is within normal limits. Stomach/Bowel: Small and large bowel are decompressed. Small hiatal hernia is present. No obvious mass in the colon. Lymphatic: No abnormal retroperitoneal adenopathy. Reproductive: Uterus is absent.  Adnexa are within normal limits. Other: No free fluid. Musculoskeletal: No vertebral compression deformity. Review of the MIP images confirms the above findings. IMPRESSION: Vascular: Stable appearance of the thoracic and abdominal aortic dissection after graft repair in the ascending aorta.  Stable extension of the dissection into the innominate artery. Stable mild dilatation of the aortic root at 4.3 cm. Stable dilatation of the proximal thoracic aorta at 3.8 cm. Left renal artery emanates from the false lumen and there is arterial insufficiency and atrophy in the left kidney. Nonvascular: Stable left adrenal nodule supporting benign etiology. Electronically Signed   By: Marybelle Killings M.D.   On: 03/10/2018 13:18   Vas [place] Lower Extremity Venous (dvt)  Result Date: 03/15/2018  Lower Venous Study Other Indications: Elevated D-dimer and chest pain x 1 week. She c/o left knee                    pain and denies SOB. Anticoagulation: 81mg  Aspirin.  Performing Technologist: Sharlett Iles RVT  Examination Guidelines: A complete evaluation includes B-mode  imaging, spectral Doppler, color Doppler, and power Doppler as needed of all accessible portions of each vessel. Bilateral testing is considered an integral part of a complete examination. Limited examinations for reoccurring indications may be performed as noted.  Right Venous Findings: +---------+---------------+---------+-----------+----------------+-------+          CompressibilityPhasicitySpontaneityProperties      Summary +---------+---------------+---------+-----------+----------------+-------+ CFV      Full           Yes      Yes                                +---------+---------------+---------+-----------+----------------+-------+ SFJ      Full           Yes      Yes                                +---------+---------------+---------+-----------+----------------+-------+ FV Prox  Full           Yes      Yes                                +---------+---------------+---------+-----------+----------------+-------+ FV Mid   Full                                                       +---------+---------------+---------+-----------+----------------+-------+ FV DistalFull           Yes      Yes                                +---------+---------------+---------+-----------+----------------+-------+ PFV      Full           Yes      Yes                                +---------+---------------+---------+-----------+----------------+-------+ POP      Full           Yes      Yes                                +---------+---------------+---------+-----------+----------------+-------+ PTV      Full  Yes      Yes                                +---------+---------------+---------+-----------+----------------+-------+ PERO     Full           No       No         softly echogenicAcute   +---------+---------------+---------+-----------+----------------+-------+ Gastroc  Full                                                        +---------+---------------+---------+-----------+----------------+-------+ GSV      Full           Yes      Yes                                +---------+---------------+---------+-----------+----------------+-------+ Sluggish flow noted in the right CFV and proximal GSV at thigh level. Acute, occlusive DVT in one of the paired peroneal veins.  Left Venous Findings: +---------+---------------+---------+-----------+----------+-------+          CompressibilityPhasicitySpontaneityPropertiesSummary +---------+---------------+---------+-----------+----------+-------+ CFV      Full           Yes      Yes                          +---------+---------------+---------+-----------+----------+-------+ SFJ      Full           Yes      Yes                          +---------+---------------+---------+-----------+----------+-------+ FV Prox  Full           Yes      Yes                          +---------+---------------+---------+-----------+----------+-------+ FV Mid   Full                                                 +---------+---------------+---------+-----------+----------+-------+ FV DistalFull           Yes      Yes                          +---------+---------------+---------+-----------+----------+-------+ PFV      Full           Yes      Yes                          +---------+---------------+---------+-----------+----------+-------+ POP      Full           Yes      Yes                          +---------+---------------+---------+-----------+----------+-------+ PTV      Full           Yes      Yes                          +---------+---------------+---------+-----------+----------+-------+  PERO     Full           Yes      Yes                          +---------+---------------+---------+-----------+----------+-------+ Gastroc  Full                                                  +---------+---------------+---------+-----------+----------+-------+ GSV      Full           Yes      Yes                          +---------+---------------+---------+-----------+----------+-------+ Sluggish flow in the left popliteal vein and proximal GSV at thigh level.  Summary: Right: Findings consistent with acute deep vein thrombosis involving the mid right peroneal vein.  Left: No evidence of deep vein thrombosis in the lower extremity. No indirect evidence of obstruction proximal to the inguinal ligament. There is a inhomogeneous structure with increase vascularity in the soleal muscle, cannot exclude AV Fistula. This inhomogeneous structure measures 1.0 x 68 x .86 cm.  *See table(s) above for measurements and observations. Electronically signed by Quay Burow MD on 03/15/2018 at 4:42:37 PM.    Final    Ct Angio Chest Aorta W/cm &/or Wo/cm  Addendum Date: 04/08/2017   ADDENDUM REPORT: 04/08/2017 11:29 ADDENDUM: Study was compared from a CTA on 07/09/2016. The size of the descending thoracic aorta has not significantly changed since 07/09/2016. Atrophy in the left kidney upper pole is also similar to the exam on 07/09/2016. Electronically Signed   By: Markus Daft M.D.   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 Follow-up aortic dissection. EXAM: CT ANGIOGRAPHY CHEST, ABDOMEN AND PELVIS TECHNIQUE: Multidetector CT imaging through the chest, abdomen and pelvis was performed using the standard protocol during bolus administration of intravenous contrast. Multiplanar reconstructed images and MIPs were obtained and reviewed to evaluate the vascular anatomy. CONTRAST:  9mL ISOVUE-370 IOPAMIDOL (ISOVUE-370) INJECTION 76% COMPARISON:  02/21/2015 FINDINGS: CTA CHEST FINDINGS Cardiovascular: Replacement of the ascending thoracic aorta. The aortic graft is patent. There is dilatation of  the native aortic root measuring roughly 4.2 cm at the sinuses of Valsalva and minimally changed from the previous examination. Again noted is aortic dissection just beyond the ascending aortic graft. Dissection extends into the proximal right brachiocephalic artery and terminates proximal to the right subclavian artery. Great vessels are patent. Typical arch configuration. The left common carotid artery and left subclavian artery appear to be arising from the true lumen. True lumen is along the lateral aspect of the descending thoracic aorta. Dissection extends throughout the descending thoracic aorta into the abdominal aorta. Distal aortic arch measures roughly 3.3 cm in diameter and stable. Proximal descending thoracic aorta measures 3.8 cm and previously measured 3.6 cm. Mid descending thoracic aorta measures 3.1 cm previously measured 3.0 cm. The aorta at the hiatus measures 3.2 cm and previously measured 3.0 cm. Overall, minimal enlargement of the descending thoracic aorta. Contrast was injected through the left arm. There is filling of multiple small paraspinal veins around the upper thoracic spine. SVC and central veins are patent.  Mediastinum/Nodes: Thyroid tissue is mildly heterogeneous. No lymph node enlargement in the mediastinum or hila. No axillary lymph node enlargement. Lungs/Pleura: Trachea and mainstem bronchi are patent. No large pleural effusions. Lungs are clear without airspace disease or consolidation. Musculoskeletal: Median sternotomy wires. No acute bone abnormality. Review of the MIP images confirms the above findings. CTA ABDOMEN AND PELVIS FINDINGS VASCULAR Aorta: Aortic dissection extends throughout the abdominal aorta and terminates just above the bifurcation. Extent of the dissection is unchanged. Aorta at the level of the celiac trunk measures 3.3 cm and previously measured 2.9 cm. Infrarenal abdominal aorta measures 2.2 cm and previously measured 2.1 cm. Incomplete contrast  opacification of the false lumen. Celiac: Celiac trunk originates from the true lumen. Celiac trunk is widely patent without significant atherosclerotic disease or stenosis. SMA: SMA region originates from the true lumen. SMA is patent without significant atherosclerotic disease or stenosis. Renals: Right renal artery originates from the true lumen without significant stenosis or plaque. The main left renal artery originates from the false lumen and there is very little contrast in the main left renal artery but this could be related to the timing of the study. Accessory left renal artery originate near the dissection flap and there may be flow from the true lumen. IMA: IMA originates from the true lumen. IMA is patent without significant stenosis or plaque. Inflow: Dissection does not extend into the iliac arteries. The iliac arteries are patent bilaterally without stenosis or dilatation. Mild atherosclerotic disease in the common iliac arteries. Proximal femoral arteries are patent bilaterally. Veins: No obvious venous abnormality within the limitations of this arterial phase study. Review of the MIP images confirms the above findings. NON-VASCULAR Hepatobiliary: Gallbladder appears to be surgically absent. No gross abnormality to the liver on this arterial phase of imaging. Pancreas: Normal appearance of the pancreas without inflammation or duct dilatation. Spleen: Normal appearance of spleen without enlargement. Adrenals/Urinary Tract: Stable low-density nodule in left adrenal gland that measures 10 Hounsfield units on the noncontrast images. Nodule measures roughly 2.3 cm and most compatible with a benign adenoma. Right adrenal gland is normal. Normal perfusion to the right kidney without hydronephrosis. No suspicious renal lesions. Delayed filling of the left kidney upper pole related to the flow from the false lumen. In addition, there has been interval atrophy of the left upper pole since the prior  examination. Previously, there was a large stone in the left renal pelvis which is no longer present. Negative for left hydronephrosis. Urinary bladder is unremarkable. Stomach/Bowel: Evidence for small hiatal hernia. Normal appearance of the small bowel without obstruction. Few colonic diverticula without acute inflammatory changes. Lymphatic: No lymph node enlargement in the abdomen or pelvis. Reproductive: Uterus has been removed. Evidence for normal ovarian tissue on sequence 22, image 226 and 234. This ovarian tissue appears stable. Other: Trace fluid in the dependent aspect of the pelvis is similar to the previous examination. No free air. Musculoskeletal: No acute bone abnormality. Review of the MIP images confirms the above findings. IMPRESSION: Stable appearance of the surgically repaired ascending thoracic aorta. Stable extent and configuration of the aortic dissection extending from the thoracic aortic graft to the distal abdominal aorta. Mild enlargement of the descending thoracic aorta and abdominal aorta. Stable enlargement of the native aortic root measuring roughly 4.2 cm. Recommend attention on follow up imaging. Increased atrophy of the left kidney upper pole secondary to flow from the false lumen. No acute abnormality in the chest, abdomen or pelvis. Electronically Signed: By: Quita Skye  Anselm Pancoast M.D. On: 04/08/2017 10:44   Ct Angio Abd/pel W/ And/or W/o  Addendum Date: 04/08/2017   ADDENDUM REPORT: 04/08/2017 11:29 ADDENDUM: Study was compared from a CTA on 07/09/2016. The size of the descending thoracic aorta has not significantly changed since 07/09/2016. Atrophy in the left kidney upper pole is also similar to the exam on 07/09/2016. Electronically Signed   By: Markus Daft M.D.   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 Follow-up aortic dissection. EXAM: CT ANGIOGRAPHY CHEST,  ABDOMEN AND PELVIS TECHNIQUE: Multidetector CT imaging through the chest, abdomen and pelvis was performed using the standard protocol during bolus administration of intravenous contrast. Multiplanar reconstructed images and MIPs were obtained and reviewed to evaluate the vascular anatomy. CONTRAST:  41mL ISOVUE-370 IOPAMIDOL (ISOVUE-370) INJECTION 76% COMPARISON:  02/21/2015 FINDINGS: CTA CHEST FINDINGS Cardiovascular: Replacement of the ascending thoracic aorta. The aortic graft is patent. There is dilatation of the native aortic root measuring roughly 4.2 cm at the sinuses of Valsalva and minimally changed from the previous examination. Again noted is aortic dissection just beyond the ascending aortic graft. Dissection extends into the proximal right brachiocephalic artery and terminates proximal to the right subclavian artery. Great vessels are patent. Typical arch configuration. The left common carotid artery and left subclavian artery appear to be arising from the true lumen. True lumen is along the lateral aspect of the descending thoracic aorta. Dissection extends throughout the descending thoracic aorta into the abdominal aorta. Distal aortic arch measures roughly 3.3 cm in diameter and stable. Proximal descending thoracic aorta measures 3.8 cm and previously measured 3.6 cm. Mid descending thoracic aorta measures 3.1 cm previously measured 3.0 cm. The aorta at the hiatus measures 3.2 cm and previously measured 3.0 cm. Overall, minimal enlargement of the descending thoracic aorta. Contrast was injected through the left arm. There is filling of multiple small paraspinal veins around the upper thoracic spine. SVC and central veins are patent. Mediastinum/Nodes: Thyroid tissue is mildly heterogeneous. No lymph node enlargement in the mediastinum or hila. No axillary lymph node enlargement. Lungs/Pleura: Trachea and mainstem bronchi are patent. No large pleural effusions. Lungs are clear without airspace  disease or consolidation. Musculoskeletal: Median sternotomy wires. No acute bone abnormality. Review of the MIP images confirms the above findings. CTA ABDOMEN AND PELVIS FINDINGS VASCULAR Aorta: Aortic dissection extends throughout the abdominal aorta and terminates just above the bifurcation. Extent of the dissection is unchanged. Aorta at the level of the celiac trunk measures 3.3 cm and previously measured 2.9 cm. Infrarenal abdominal aorta measures 2.2 cm and previously measured 2.1 cm. Incomplete contrast opacification of the false lumen. Celiac: Celiac trunk originates from the true lumen. Celiac trunk is widely patent without significant atherosclerotic disease or stenosis. SMA: SMA region originates from the true lumen. SMA is patent without significant atherosclerotic disease or stenosis. Renals: Right renal artery originates from the true lumen without significant stenosis or plaque. The main left renal artery originates from the false lumen and there is very little contrast in the main left renal artery but this could be related to the timing of the study. Accessory left renal artery originate near the dissection flap and there may be flow from the true lumen. IMA: IMA originates from the true lumen. IMA is patent without significant stenosis or plaque. Inflow: Dissection does not extend into the iliac arteries. The iliac arteries are patent bilaterally without  stenosis or dilatation. Mild atherosclerotic disease in the common iliac arteries. Proximal femoral arteries are patent bilaterally. Veins: No obvious venous abnormality within the limitations of this arterial phase study. Review of the MIP images confirms the above findings. NON-VASCULAR Hepatobiliary: Gallbladder appears to be surgically absent. No gross abnormality to the liver on this arterial phase of imaging. Pancreas: Normal appearance of the pancreas without inflammation or duct dilatation. Spleen: Normal appearance of spleen without  enlargement. Adrenals/Urinary Tract: Stable low-density nodule in left adrenal gland that measures 10 Hounsfield units on the noncontrast images. Nodule measures roughly 2.3 cm and most compatible with a benign adenoma. Right adrenal gland is normal. Normal perfusion to the right kidney without hydronephrosis. No suspicious renal lesions. Delayed filling of the left kidney upper pole related to the flow from the false lumen. In addition, there has been interval atrophy of the left upper pole since the prior examination. Previously, there was a large stone in the left renal pelvis which is no longer present. Negative for left hydronephrosis. Urinary bladder is unremarkable. Stomach/Bowel: Evidence for small hiatal hernia. Normal appearance of the small bowel without obstruction. Few colonic diverticula without acute inflammatory changes. Lymphatic: No lymph node enlargement in the abdomen or pelvis. Reproductive: Uterus has been removed. Evidence for normal ovarian tissue on sequence 22, image 226 and 234. This ovarian tissue appears stable. Other: Trace fluid in the dependent aspect of the pelvis is similar to the previous examination. No free air. Musculoskeletal: No acute bone abnormality. Review of the MIP images confirms the above findings. IMPRESSION: Stable appearance of the surgically repaired ascending thoracic aorta. Stable extent and configuration of the aortic dissection extending from the thoracic aortic graft to the distal abdominal aorta. Mild enlargement of the descending thoracic aorta and abdominal aorta. Stable enlargement of the native aortic root measuring roughly 4.2 cm. Recommend attention on follow up imaging. Increased atrophy of the left kidney upper pole secondary to flow from the false lumen. No acute abnormality in the chest, abdomen or pelvis. Electronically Signed: By: Markus Daft M.D. On: 04/08/2017 10:44    Ct Angio Chest/abd/pel For Dissection W And/or W/wo  Result Date:  03/08/2016 CLINICAL DATA:  64 year old female with chest tightness. History of type 1 aortic dissection repair. EXAM: CT ANGIOGRAPHY CHEST, ABDOMEN AND PELVIS TECHNIQUE: Multidetector CT imaging through the chest, abdomen and pelvis was performed using the standard protocol during bolus administration of intravenous contrast. Multiplanar reconstructed images and MIPs were obtained and reviewed to evaluate the vascular anatomy. CONTRAST:  80 cc Isovue 370 COMPARISON:  Chest radiograph dated 03/07/2016 and CT dated 02/21/2015 FINDINGS: CTA CHEST FINDINGS Cardiovascular: There is a stable moderate cardiomegaly. No pericardial effusion. Type A aortic dissection again noted with postsurgical changes of repair of the root of the aorta. The ascending aortic graft appears unremarkable and similar to the prior radiograph. There is no extravasation of contrast. There is contrast in both true and false lumen. There is extension of the dissection flap into the proximal portion of the right innominate artery. This findings are similar to prior CT. The origins of the great vessels of the aortic arch appear to arise from the true lumen. The central pulmonary arteries appear unremarkable and patent. There is however a small pulmonary embolus in the subsegmental branch point of the right lower lobe (series 601 image 83 and coronal series 602, image 105). This clot appears nonocclusive and may be chronic in nature and represent scarring. Correlation with clinical exam and history of  prior TS recommended. Mediastinum/Nodes: There is no hilar or mediastinal adenopathy. The esophagus is grossly unremarkable. Lungs/Pleura: The lungs are clear. There is no pleural effusion or pneumothorax. The central airways are patent. Musculoskeletal: Median sternotomy wires. No acute osseous pathology. Review of the MIP images confirms the above findings. CTA ABDOMEN AND PELVIS FINDINGS VASCULAR Aorta: Type A dissection extends just above the aortic  bifurcation. There is contrast in the false lumen although appears less dense compared to the contrast within the true lumen. Celiac: The origin of the celiac axis arises from the true lumen and appears patent. SMA: The origin of the SMA appears patent and arises from the true lumen. Renals: The right renal artery appears patent and arises from the true lumen. The left renal artery is patent as well. The origin of the left renal artery is not seen with certainty. IMA: The IMA is patent and arises from the true lumen. Inflow: Mild atherosclerotic calcification of the common iliac arteries. The common iliac external and internal iliac arteries are patent. Veins: No obvious venous abnormality within the limitations of this arterial phase study. Review of the MIP images confirms the above findings. No intra-abdominal free air.  Trace fluid within the pelvis NON-VASCULAR Hepatobiliary: Cholecystectomy. The liver is unremarkable. No intrahepatic biliary ductal dilatation. Pancreas: Unremarkable. No pancreatic ductal dilatation or surrounding inflammatory changes. Spleen: Normal in size without focal abnormality. Adrenals/Urinary Tract: Stable 2.2 cm left adrenal adenoma. The right adrenal gland is unremarkable. There is left renal lower pole atrophy and scarring. The right kidney is unremarkable. There is no hydronephrosis on either side. The visualized ureters and urinary bladder appear unremarkable. Stomach/Bowel: There is colonic diverticulosis without active inflammatory changes. Moderate amount of stool noted throughout the colon. There is no evidence of bowel obstruction or active inflammation. The appendix is not visualized with certainty. No inflammatory changes identified in the right lower quadrant. Lymphatic: No adenopathy. Reproductive: Hysterectomy. Other: None Musculoskeletal: Degenerate changes of the spine. No acute fracture. Review of the MIP images confirms the above findings. IMPRESSION: 1. Stable  appearing type A aortic dissection status post prior repair of the aortic root. Overall the appearance of the aorta and dissection is similar to the prior study. No periaortic inflammation or extravasation of contrast. 2. Small pulmonary embolism involving the subsegmental branch point of the right lower lobe. This may represent an acute nonocclusive PE versus chronic scarring. Correlation with clinical exam and history of prior PE recommended. 3. Moderate cardiomegaly similar to prior CT. 4. Colonic diverticulosis without active inflammatory changes. No bowel obstruction. 5. Trace free fluid within the pelvis of indeterminate etiology. These results were called by telephone at the time of interpretation on 03/08/2016 at 3:01 am to Dr. Tennis Must , who verbally acknowledged these results. Electronically Signed   By: Anner Crete M.D.   On: 03/08/2016 03:08    Ct Angio Chest Aorta W/cm &/or Wo/cm  02/21/2015  CLINICAL DATA:  History of aortic dissection with prior surgeon EXAM: CT ANGIOGRAPHY CHEST CT ABDOMEN AND PELVIS WITH CONTRAST TECHNIQUE: Multidetector CT imaging of the chest was performed using the standard protocol during bolus administration of intravenous contrast. Multiplanar CT image reconstructions and MIPs were obtained to evaluate the vascular anatomy. Multidetector CT imaging of the abdomen and pelvis was performed using the standard protocol during bolus administration of intravenous contrast. CONTRAST:  75 mL Isovue 370 COMPARISON:  None. FINDINGS: CTA CHEST FINDINGS There are changes consistent with the patient's known history of type 1 aortic  dissection with interval surgical repair of the ascending aorta. The ascending tube graft appears well placed without extravasation. Flow into both the false and true lumens is noted. The dissection flap extends into the proximal portion of the innominate artery on the right similar that seen on the prior exam. The false lumen again supplies flow to the  left common carotid artery and left subclavian artery. The lungs are well aerated bilaterally. No sizable hilar or mediastinal adenopathy is noted. Bilateral thyroid nodules are again noted stable. No bony abnormality is noted. CT ABDOMEN and PELVIS FINDINGS The liver demonstrates some diffuse decreased attenuation consistent with fatty infiltration. The gallbladder has been surgically removed. The spleen, pancreas and right adrenal gland are within normal limits. A hypodense lesion is noted arising from the left adrenal gland which is stable in appearance likely representing an adenoma. The right kidney demonstrates a normal enhancement pattern. The left kidney demonstrates decreased enhancement similar to that seen on the prior exam. The dominant left renal artery is noted arising from the true lumen and occluding just beyond its origin. An accessory left renal artery is noted arising from the false lumen and remains patent. Relative sparing of the lower pole of the left kidney is noted. A large left renal pelvis stone as well as a smaller lower pole stone are again identified and stable. The bladder is partially distended. No pelvic mass lesion is noted. Scattered diverticular change of the colon is noted. No acute bony abnormality is noted. Vascular: The false lumen of the dissection again supplies the celiac axis, superior mesenteric artery, right renal artery and inferior mesenteric artery. Flow to these vessels is widely patent. The dissection flap extends to just above the aortic bifurcation. Some fenestrations in the distal flap are noted. Flow via the false lumen into the iliac arteries is noted bilaterally. The previously seen extension of the dissection into the left common iliac artery is no longer identified. Review of the MIP images confirms the above findings. 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 The tube graft is widely patent with flow into both the  true and false lumens. The dissection flap terminates just above the aortic bifurcation with improved flow into the left iliac artery when compared with the prior exam. No new focal dissection or abnormality is noted. Visceral flow is similar to that noted on the prior exam. Left renal calculi without obstructive change. 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 No acute abnormality is noted. Electronically Signed   By: Inez Catalina M.D.   On: 02/21/2015 13:39      Recent Lab Findings: Lab Results  Component Value Date   WBC 6.5 02/02/2019   HGB 12.8 02/02/2019   HCT 42.2 02/02/2019   PLT 229 02/02/2019   GLUCOSE 104 (H) 02/02/2019   CHOL 146 09/06/2018   TRIG 61 09/06/2018   HDL 53 09/06/2018   LDLCALC 81 09/06/2018   ALT 14 09/06/2018   AST 12 09/06/2018   NA 137 02/02/2019   K 4.6 02/02/2019   CL 103 02/02/2019   CREATININE 1.33 (H) 02/02/2019   BUN 16 02/02/2019   CO2 26 02/02/2019   TSH 0.968 08/31/2016   INR 0.94 04/26/2015   HGBA1C 6.0 (H) 08/31/2016     ECHOCARDIOGRAM REPORT       Patient Name:  [name] Date of Exam: 04/07/2019  Medical Rec #: [id number]      Height:    63.0 in  Accession #:  [id number]     Weight:    200.0 lb  Date of Birth: [birth year]      BSA:     1.934 m  Patient Age:  55 years      BP:      122/68 mmHg  Patient Gender: F          HR:      51 bpm.  Exam Location: [facility]   Procedure: 2D Echo, Cardiac Doppler and Color Doppler   Indications:  I71.01 Ascending aortic dissection; Z98.890 S/P aortic         dissection repair; Aortic root dilation    History:    Patient has prior history of Echocardiogram examinations,  most         recent 03/21/2018. CAD, Signs/Symptoms:Chest Pain; Risk          Factors:Hypertension and Dyslipidemia. Obeity. Chronic low  back         pain. Obstructive sleep apnea (CPAP).    Sonographer:  Diamond Nickel RCS  Referring Phys: [place]    1. Left ventricular ejection fraction, by estimation, is 55 to 60%. The  left ventricle has normal function. The left ventricle has no regional  wall motion abnormalities. There is moderate left ventricular hypertrophy  of the basal-septal segment. Left  ventricular diastolic parameters are indeterminate.  2. Right ventricular systolic function is normal. The right ventricular  size is normal.  3. The mitral valve is normal in structure. Mild mitral valve  regurgitation. No evidence of mitral stenosis.  4. The aortic valve is tricuspid. There is moderate thickening and  moderate calcification of the aortic valve. Aortic valve regurgitation is  mild. Aortic regurgitation PHT measures 505 msec. No aortic stenosis is  present.  5. Aortic dilatation noted and root/ascending aorta has been  repaired/replaced. There is mild dilatation of the aortic root measuring  43 mm.  6. The inferior vena cava is normal in size with greater than 50%  respiratory variability, suggesting right atrial pressure of 3 mmHg.  7. Compared to echo 03/2018 the aortic root diameter has increased from 4  to 4.3cm.   FINDINGS  Left Ventricle: Left ventricular ejection fraction, by estimation, is 55  to 60%. The left ventricle has normal function. The left ventricle has no  regional wall motion abnormalities. The left ventricular internal cavity  size was normal in size. There is  moderate left ventricular hypertrophy of the basal-septal segment. Left  ventricular diastolic parameters are indeterminate. Normal left  ventricular filling pressure.   Right Ventricle: The right ventricular size is normal. No increase in  right ventricular wall thickness. Right ventricular systolic function is    normal.   Left Atrium: Left atrial size was normal in size.   Right Atrium: Right atrial size was normal in size.   Pericardium: There is no evidence of pericardial effusion.   Mitral Valve: The mitral valve is normal in structure. Normal mobility of  the mitral valve leaflets. Mild mitral valve regurgitation. No evidence of  mitral valve stenosis.   Tricuspid Valve: The tricuspid valve is normal in structure. Tricuspid  valve regurgitation is not demonstrated. No evidence of tricuspid  stenosis.   Aortic Valve: The aortic valve is tricuspid. . There is moderate  thickening and moderate calcification of the aortic valve.  Aortic valve  regurgitation is mild. Aortic regurgitation PHT measures 505 msec. No  aortic stenosis is present. There is moderate  thickening of the aortic valve. There is moderate calcification of the  aortic valve.   Pulmonic Valve: The pulmonic valve was normal in structure. Pulmonic valve  regurgitation is trivial. No evidence of pulmonic stenosis.   Aorta: Aortic dilatation noted and the aortic root/ascending aorta has  been repaired/replaced. There is mild dilatation of the aortic root  measuring 43 mm.   Venous: The inferior vena cava is normal in size with greater than 50%  respiratory variability, suggesting right atrial pressure of 3 mmHg.   IAS/Shunts: No atrial level shunt detected by color flow Doppler.     LEFT VENTRICLE  PLAX 2D  LVIDd:     3.50 cm Diastology  LVIDs:     2.60 cm LV e' lateral:  10.40 cm/s  LV PW:     1.30 cm LV E/e' lateral: 8.4  LV IVS:    1.50 cm LV e' medial:  6.53 cm/s  LVOT diam:   2.00 cm LV E/e' medial: 13.3  LV SV:     85  LV SV Index:  44  LVOT Area:   3.14 cm     RIGHT VENTRICLE  RV Basal diam: 3.50 cm  RV S prime:   7.29 cm/s  TAPSE (M-mode): 1.1 cm   LEFT ATRIUM       Index    RIGHT ATRIUM      Index  LA diam:    3.40 cm 1.76 cm/m RA Area:    15.30 cm  LA Vol (A2C):  36.5 ml 18.88 ml/m RA Volume:  34.60 ml 17.89 ml/m  LA Vol (A4C):  33.9 ml 17.53 ml/m  LA Biplane Vol: 35.7 ml 18.46 ml/m  AORTIC VALVE  LVOT Vmax:  108.00 cm/s  LVOT Vmean: 65.800 cm/s  LVOT VTI:  0.270 m  AI PHT:   505 msec    AORTA  Ao Root diam: 4.30 cm   MITRAL VALVE  MV Area (PHT): 3.03 cm  SHUNTS  MV Decel Time: 250 msec  Systemic VTI: 0.27 m  MV E velocity: 87.00 cm/s Systemic Diam: 2.00 cm  MV A velocity: 72.00 cm/s  MV E/A ratio: 1.21   Fransico Him MD  Electronically signed by Fransico Him MD  Signature Date/Time: 04/07/2019/10:37:47 AM        Assessment / Plan:             Grace Isaac MD      [address] [place] [address] Office [phone]   Beeper [phone]  10/19/2019 7:37 AM

## 2019-10-23 NOTE — Progress Notes (Signed)
Reviewed and agree with documentation and assessment and plan. K. Veena Nickolus Wadding , MD   

## 2019-11-05 ENCOUNTER — Other Ambulatory Visit: Payer: Self-pay | Admitting: Cardiology

## 2019-11-17 ENCOUNTER — Other Ambulatory Visit: Payer: Self-pay | Admitting: Internal Medicine

## 2019-11-17 DIAGNOSIS — Z1231 Encounter for screening mammogram for malignant neoplasm of breast: Secondary | ICD-10-CM

## 2019-11-27 ENCOUNTER — Ambulatory Visit
Admission: RE | Admit: 2019-11-27 | Discharge: 2019-11-27 | Disposition: A | Discharge status: Home / Self Care | Payer: Medicare Other | Source: Ambulatory Visit | Attending: Cardiothoracic Surgery | Admitting: Cardiothoracic Surgery

## 2019-11-27 DIAGNOSIS — I7101 Dissection of ascending aorta: Secondary | ICD-10-CM

## 2019-11-27 MED ORDER — IOPAMIDOL (ISOVUE-370) INJECTION 76%
60.0000 mL | Freq: Once | INTRAVENOUS | Status: AC | PRN
  Administered 2019-11-27: 60 mL via INTRAVENOUS

## 2019-11-30 ENCOUNTER — Ambulatory Visit: Payer: Medicare Other | Admitting: Cardiothoracic Surgery

## 2019-11-30 ENCOUNTER — Other Ambulatory Visit: Payer: Self-pay

## 2019-11-30 VITALS — BP 104/64 | HR 71 | Resp 18 | Ht 63.0 in | Wt 205.0 lb

## 2019-11-30 DIAGNOSIS — I7103 Dissection of thoracoabdominal aorta: Secondary | ICD-10-CM | POA: Diagnosis not present

## 2019-11-30 NOTE — Progress Notes (Signed)
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 SURGICAL PROCEDURES: 1. Replacement of ascending aorta. 2. Resuspension of aortic valve  3. Right axillary artery cannulation and hypothermic circulatory circulatory arrest. SURGEON: Lanelle Bal, MD.  History of Present Illness:   Patient returns to the office today in follow-up after resuspension of her aortic valve and ascending aorta for type I aortic dissection done March 02, 2014.    She is followed by Dr. Radford Pax from cardiology blood pressure control  She notes that she was Covid vaccination x2 and a flu vaccination.   She had left flank pain several days ago and was sent to try imaging for abdominal CT scan,  She notes that pain had completely disappeared and she went for her previously scheduled CTA of the chest today   Daiva Eves, MD - 11/29/2019  Formatting of this note might be different from the original.  INDICATION: hx of bilateral kidney stones.  COMPARISON: None.   TECHNIQUE: Routine CT of the abdomen and pelvis was performed without intravenous or oral contrast per renal calculus evaluation protocol. This exam is intended to evaluate for presence of renal calculi and does not adequately assess for renal or urothelial malignancies. Radiation dose reduction was utilized (automated exposure control, mA or kV adjustment based on patient size, or iterative image reconstruction).   FINDINGS:   Lung bases are grossly unremarkable.   Liver is grossly  unremarkable. Gallbladder is absent. The spleen, pancreas, and adrenal glands are grossly unremarkable. There is scarring/atrophy of the upper pole of the left kidney. No hydronephrosis seen in either kidney. No evidence of any obstructive ureteral stones. No definite bladder calculi identified.   No evidence of any overt inflammatory or obstructive bowel process.No free air, free fluid, or overt lymphadenopathy is identified in the abdomen or pelvis. The distal descending thoracic aorta is borderline aneurysmal measuring 3 cm.   Abdominal wall is grossly unremarkable. Nonspecific bowel density posterior to theabdominal wall musculature in the lower right abdomen series 2 image 81. No definite acute osseous process identified.    IMPRESSION:  1. Study is somewhat limited due to breathing artifact. There is no definite evidence of any obstructive renal or ureteral stones. No hydronephrosis identified.  2. Incidental note of borderline aneurysmal distal descending thoracic aorta measuring up to 3 cm.    Past Medical History:  Diagnosis Date  . Anemia    childhood  . Arthritis    right knee  . Ascending aortic dissection [facility])    s/p repair by Dr. Servando Snare  . Blood transfusion without reported diagnosis    with heart surgery-repair aorta  . CAD (coronary artery disease)    a. 04/2015: cath showed 30% Ost RCA stenosis, 20% mid-LAD stenosis, and normal LV function.   . Cataract    bilateral eyes, per pt.  . Congestive dilated cardiomyopathy ([place]) 08/01/2014  . DVT (deep venous thrombosis) ([place])    right  . GERD   . HEMATURIA UNSPECIFIED   . History of kidney stones   .  HYPERLIPIDEMIA   . HYPERTENSION   . Morbid obesity ([place])   . Myocardial infarction ([place])    01/30/2014  . MYOSITIS   . OSA on CPAP 05/10/2017   mild OSA with an AHI of 5.2/hr overall but severe during REM sleep with an AHI of 41.4/hr and underwent CPAP titration to 12cm H2O  . Sleep apnea    cpap-  doesnt use it-  last study years ago  . 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 Hydralazine Nausea Only  . Motrin [Ibuprofen] Nausea Only    Upset GI (motrin brand causes the side effect)    Current Outpatient Medications  Medication Sig Dispense Refill  . amLODipine (NORVASC) 10 MG tablet Take 1 tablet (10 mg total) by mouth daily. 90 tablet 0  . aspirin EC 81 MG EC tablet Take 1 tablet (81 mg total) by mouth daily.    [name] atorvastatin (LIPITOR) 40 MG tablet TAKE 1 TABLET BY MOUTH ONCE DAILY AT 6:00 IN THE  EVENING 90 tablet 3  . carvedilol (COREG) 12.5 MG tablet Take 1 tablet by mouth twice daily 180 tablet 2  . Cholecalciferol 1000 units tablet Take 1,000 Units by mouth daily.    . colesevelam (WELCHOL) 625 MG tablet Take one tablet before lunch and bedtime.  Avoid taking within 2-3 hours of other medications to prevent interaction 60 tablet 6  . diclofenac Sodium (VOLTAREN) 1 % GEL SMARTSIG:4 Gram(s) Topical 4 Times Daily PRN    . dicyclomine (BENTYL) 20 MG tablet Take 1 tablet (20 mg total) by mouth 3 (three) times daily as needed for spasms. 60 tablet 11  . folic acid (FOLVITE) 1 MG tablet Take 1 mg by mouth daily.    [name] lisinopril (ZESTRIL) 40 MG tablet Take 1 tablet (40 mg total) by mouth daily. 90 tablet 0  . spironolactone (ALDACTONE) 50 MG tablet Take 1 tablet by mouth once daily 90 tablet 3  . vitamin E 400 UNIT capsule Take 400 Units by mouth daily.     No current facility-administered medications for this visit.      Physical Exam: BP 104/64 (BP Location: Left Arm, Patient Position: Sitting)   Pulse 71   Resp 18   Ht 5\' 3"  (1.6 m)   Wt 205 lb (93 kg)   SpO2 96% Comment: RA with mask on  BMI  36.31 kg/m  General appearance: alert and cooperative Head: Normocephalic, without obvious abnormality, atraumatic Lymph nodes: Cervical, supraclavicular, and axillary nodes normal. Resp: clear to auscultation bilaterally Cardio: regular rate and rhythm, S1, S2 normal, no murmur, click, rub or gallop GI: soft, non-tender; bowel sounds normal; no masses,  no organomegaly Extremities: extremities normal, atraumatic, no cyanosis or edema and Homans sign is negative, no sign of DVT Neurologic: Grossly normal  Diagnostic Studies & Laboratory data:     Recent Radiology Findings:   CT ANGIO CHEST AORTA W/CM & OR WO/CM  Result Date: 11/27/2019 CLINICAL DATA:  64 year old with history of [place] type A aortic dissection and replacement of the ascending thoracic aorta. EXAM: CT ANGIOGRAPHY CHEST WITH CONTRAST TECHNIQUE: Multidetector CT imaging of the chest was  performed using the standard protocol during bolus administration of intravenous contrast. Multiplanar CT image reconstructions and MIPs were obtained to evaluate the vascular anatomy. CONTRAST:  53mL ISOVUE-370 IOPAMIDOL (ISOVUE-370) INJECTION 76% COMPARISON:  CTA chest, abdomen and pelvis 02/10/2019 FINDINGS: Cardiovascular: Stable enlargement of the aortic root measuring at least 4.4 cm. Aortic root may measure larger in the AP dimension (5.1 cm) but difficult to accurately measure. Again noted is replacement of the ascending thoracic aorta. The aortic graft is patent. Again noted is an aortic dissection involving the aortic arch and descending thoracic aorta. Aortic arch measures up to 3.5 cm and stable. Dissection extends into the innominate artery and then terminates prior to the bifurcation. Right subclavian artery and right common carotid artery are patent. Unusual configuration of the right axillary artery is likely postsurgical. Left common carotid artery is patent without dissection flap. Left subclavian artery is patent without dissection  flap. Proximal vertebral arteries are patent bilaterally. Proximal descending thoracic aorta measures 3.9 cm and stable. There is contrast in both the false and true lumens. Mid descending thoracic aorta measures 3.3 cm and stable. Distal descending thoracic aorta measures 2.9 cm and minimally changed. Aortic dissection again extends into the abdominal aorta. Celiac trunk and SMA originating from the true lumen. Right renal artery appears to originate from the true lumen. Again noted are 2 left renal arteries. The more superior left renal artery has minimal flow and probably originates from the false lumen. The smaller inferior renal artery originates possibly from the true lumen. Heart size is stable. No significant pericardial fluid. Limited evaluation of the pulmonary arteries. Mediastinum/Nodes: No suspicious lymph node enlargement. No evidence for a mediastinal hematoma. Multiple thyroid nodules, largest measuring to 1.4 cm. Not clinically significant; no follow-up imaging recommended (ref: J Am Coll Radiol. 2015 Feb;12(2): 143-50). Lungs/Pleura: Trachea and mainstem bronchi are patent. No significant airspace disease or consolidation. No pleural effusions. Few dependent densities in the right lower lobe. Upper Abdomen: Again noted is atrophy in the left kidney upper pole with decreased perfusion. Small calcification involving the left kidney. Visualized right kidney unremarkable. Cholecystectomy. No acute abnormality in the visualized upper abdomen. Musculoskeletal: Prior median sternotomy. Review of the MIP images confirms the above findings. IMPRESSION: 1. Stable appearance of the chronic residual dissection involving the thoracoabdominal aorta. No significant change in the size of the aortic arch or descending thoracic aorta since 02/10/2019. 2. Stable appearance of the replaced ascending thoracic aorta. 3. Enlargement of the aortic root. Difficult to accurately measure the aortic root but overall size has  not significantly changed since 02/10/2019. 4. No acute chest abnormality. 5. Aortic dissection extends into the abdominal aorta and incompletely evaluated on this examination. 6. Stable atrophy and decreased perfusion in the left kidney upper pole. Electronically Signed   By: Markus Daft M.D.   On: 11/27/2019 10:29   Ct Angio Chest Pe W Or Wo Contrast  Result Date: 03/15/2018 CLINICAL DATA:  Lower extremity venous duplex ultrasound today demonstrated right peroneal vein DVT. Recent complaint of chest pain and history of prior aortic dissection with replacement of ascending thoracic aorta in 2016. Recent assessment the thoracic aorta by CTA on 03/10/2018. That study was not timed for pulmonary artery evaluation. EXAM: CT ANGIOGRAPHY CHEST WITH CONTRAST TECHNIQUE: Multidetector CT imaging of the chest was performed using the standard protocol during bolus administration of intravenous contrast. Multiplanar CT image reconstructions and MIPs were obtained to evaluate the vascular anatomy. CONTRAST:  70 mL ISOVUE-370 IV COMPARISON:  CTA  of the chest on 03/10/2018 FINDINGS: Cardiovascular: The current study demonstrates excellent opacification of the right heart and pulmonary arteries. There is no evidence of pulmonary embolism. The heart size is stable and at the upper limits of normal. No evidence of pericardial fluid. The thoracic aorta is not opacified and therefore the residual dissection and replaced ascending thoracic aorta can not be assessed. Central pulmonary arteries are normal in caliber. Mediastinum/Nodes: No enlarged mediastinal, hilar, or axillary lymph nodes. Thyroid gland, trachea, and esophagus demonstrate no significant findings. Lungs/Pleura: There is no evidence of pulmonary edema, consolidation, pneumothorax, nodule or pleural fluid. Upper Abdomen: Differential density in the abdominal aorta with higher density in the anterior true lumen of the abdominal aorta at the level of chronic dissection.  Musculoskeletal: No chest wall abnormality. No acute or significant osseous findings. Review of the MIP images confirms the above findings. IMPRESSION: No evidence of pulmonary embolism or other acute findings in the chest. The current CTA was timed strictly for pulmonary arterial evaluation and is not adequate for aortic evaluation. The recent CTA on 03/10/2018 was timed for aortic evaluation. Electronically Signed   By: Aletta Edouard M.D.   On: 03/15/2018 16:37   Ct Angio Chest/abd/pel For Dissection W And/or W/wo  Result Date: 03/10/2018 CLINICAL DATA:  Chest pain.  History of aortic dissection. EXAM: CT ANGIOGRAPHY CHEST, ABDOMEN AND PELVIS TECHNIQUE: Multidetector CT imaging through the chest, abdomen and pelvis was performed using the standard protocol during bolus administration of intravenous contrast. Multiplanar reconstructed images and MIPs were obtained and reviewed to evaluate the vascular anatomy. CONTRAST:  130mL ISOVUE-370 IOPAMIDOL (ISOVUE-370) INJECTION 76% COMPARISON:  04/08/2017 FINDINGS: CTA CHEST FINDINGS Cardiovascular: Graft repair of the ascending aorta, beginning just above the valve to the proximal arch is stable in appearance. There is no evidence of pseudoaneurysm formation. Maximal diameter of the aortic root is 4.3 cm. This is not significantly changed. The dissection begins in the proximal aortic arch and extends into the abdominal aorta. There is no evidence of acute intramural hematoma. Stable extension of of the dissection into the innominate artery which terminates before the bifurcation. Left common carotid artery and left subclavian artery emanates from the true lumen. The true and false lumen within the aorta are patent. Right vertebral artery is patent. Left vertebral artery is obscured by venous collaterals. No evidence of significant coronary artery calcifications. Maximal diameter of the aorta in the arch and proximal descending thoracic aorta are 3.5 and 3.8 cm  respectively. These measurements are not significantly changed. Mediastinum/Nodes: No abnormal mediastinal adenopathy or pericardial effusion. Unremarkable esophagus. Multiple thyroid hypodensities are not significantly changed. Lungs/Pleura: No pneumothorax.  No pleural effusion.  Clear lungs. Musculoskeletal: No vertebral compression deformity. Review of the MIP images confirms the above findings. CTA ABDOMEN AND PELVIS FINDINGS VASCULAR Aorta: The aortic dissection continues throughout the abdominal aorta, terminating just above the bifurcation. This is stable. Maximal diameter of the abdominal aorta in the upper abdomen is 2.9 cm compared with 3.3 cm on the prior study. The true and false lumen both opacified with contrast. Mixing artifact in the false lumen is present. Celiac: Patent.  Emanating from the true lumen. SMA: Patent.  Emanating from the true lumen. Renals: Single right renal artery emanating from the true lumen is patent. Single left renal artery emanates from the false lumen and is patent. IMA: Patent.  Emanates from the true lumen. Inflow: Bilateral common, internal, and external iliac arteries are patent. There is no evidence of extension of  the dissection into the iliac arterial system. Mild atherosclerotic calcifications are noted. Review of the MIP images confirms the above findings. NON-VASCULAR Hepatobiliary: Diffuse hepatic steatosis.  Postcholecystectomy. Pancreas: Unremarkable. Spleen: Unremarkable Adrenals/Urinary Tract: There is poor parenchymal enhancement of the upper half of the left kidney on arterial phase imaging. This is associated with atrophy of the left kidney. These findings suggest an element of arterial insufficiency to the left kidney and these findings are not significantly changed. Right kidney and adrenal glands are within normal limits. 1.9 cm left adrenal nodule is stable. Bladder is within normal limits. Stomach/Bowel: Small and large bowel are decompressed. Small  hiatal hernia is present. No obvious mass in the colon. Lymphatic: No abnormal retroperitoneal adenopathy. Reproductive: Uterus is absent.  Adnexa are within normal limits. Other: No free fluid. Musculoskeletal: No vertebral compression deformity. Review of the MIP images confirms the above findings. IMPRESSION: Vascular: Stable appearance of the thoracic and abdominal aortic dissection after graft repair in the ascending aorta. Stable extension of the dissection into the innominate artery. Stable mild dilatation of the aortic root at 4.3 cm. Stable dilatation of the proximal thoracic aorta at 3.8 cm. Left renal artery emanates from the false lumen and there is arterial insufficiency and atrophy in the left kidney. Nonvascular: Stable left adrenal nodule supporting benign etiology. Electronically Signed   By: Marybelle Killings M.D.   On: 03/10/2018 13:18   Vas [place] Lower Extremity Venous (dvt)  Result Date: 03/15/2018  Lower Venous Study Other Indications: Elevated D-dimer and chest pain x 1 week. She c/o left knee                    pain and denies SOB. Anticoagulation: 81mg  Aspirin.  Performing Technologist: Sharlett Iles RVT  Examination Guidelines: A complete evaluation includes B-mode imaging, spectral Doppler, color Doppler, and power Doppler as needed of all accessible portions of each vessel. Bilateral testing is considered an integral part of a complete examination. Limited examinations for reoccurring indications may be performed as noted.  Right Venous Findings: +---------+---------------+---------+-----------+----------------+-------+          CompressibilityPhasicitySpontaneityProperties      Summary +---------+---------------+---------+-----------+----------------+-------+ CFV      Full           Yes      Yes                                +---------+---------------+---------+-----------+----------------+-------+ SFJ      Full           Yes      Yes                                 +---------+---------------+---------+-----------+----------------+-------+ FV Prox  Full           Yes      Yes                                +---------+---------------+---------+-----------+----------------+-------+ FV Mid   Full                                                       +---------+---------------+---------+-----------+----------------+-------+ FV DistalFull  Yes      Yes                                +---------+---------------+---------+-----------+----------------+-------+ PFV      Full           Yes      Yes                                +---------+---------------+---------+-----------+----------------+-------+ POP      Full           Yes      Yes                                +---------+---------------+---------+-----------+----------------+-------+ PTV      Full           Yes      Yes                                +---------+---------------+---------+-----------+----------------+-------+ PERO     Full           No       No         softly echogenicAcute   +---------+---------------+---------+-----------+----------------+-------+ Gastroc  Full                                                       +---------+---------------+---------+-----------+----------------+-------+ GSV      Full           Yes      Yes                                +---------+---------------+---------+-----------+----------------+-------+ Sluggish flow noted in the right CFV and proximal GSV at thigh level. Acute, occlusive DVT in one of the paired peroneal veins.  Left Venous Findings: +---------+---------------+---------+-----------+----------+-------+          CompressibilityPhasicitySpontaneityPropertiesSummary +---------+---------------+---------+-----------+----------+-------+ CFV      Full           Yes      Yes                          +---------+---------------+---------+-----------+----------+-------+ SFJ      Full            Yes      Yes                          +---------+---------------+---------+-----------+----------+-------+ FV Prox  Full           Yes      Yes                          +---------+---------------+---------+-----------+----------+-------+ FV Mid   Full                                                 +---------+---------------+---------+-----------+----------+-------+ FV  DistalFull           Yes      Yes                          +---------+---------------+---------+-----------+----------+-------+ PFV      Full           Yes      Yes                          +---------+---------------+---------+-----------+----------+-------+ POP      Full           Yes      Yes                          +---------+---------------+---------+-----------+----------+-------+ PTV      Full           Yes      Yes                          +---------+---------------+---------+-----------+----------+-------+ PERO     Full           Yes      Yes                          +---------+---------------+---------+-----------+----------+-------+ Gastroc  Full                                                 +---------+---------------+---------+-----------+----------+-------+ GSV      Full           Yes      Yes                          +---------+---------------+---------+-----------+----------+-------+ Sluggish flow in the left popliteal vein and proximal GSV at thigh level.  Summary: Right: Findings consistent with acute deep vein thrombosis involving the mid right peroneal vein.  Left: No evidence of deep vein thrombosis in the lower extremity. No indirect evidence of obstruction proximal to the inguinal ligament. There is a inhomogeneous structure with increase vascularity in the soleal muscle, cannot exclude AV Fistula. This inhomogeneous structure measures 1.0 x 68 x .86 cm.  *See table(s) above for measurements and observations. Electronically signed by Quay Burow MD on  03/15/2018 at 4:42:37 PM.    Final    Ct Angio Chest Aorta W/cm &/or Wo/cm  Addendum Date: 04/08/2017   ADDENDUM REPORT: 04/08/2017 11:29 ADDENDUM: Study was compared from a CTA on 07/09/2016. The size of the descending thoracic aorta has not significantly changed since 07/09/2016. Atrophy in the left kidney upper pole is also similar to the exam on 07/09/2016. Electronically Signed   By: Markus Daft M.D.   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 Follow-up aortic dissection. EXAM: CT ANGIOGRAPHY CHEST, ABDOMEN AND PELVIS TECHNIQUE: Multidetector CT imaging through the chest, abdomen and pelvis was performed using the standard protocol during bolus administration of intravenous contrast. Multiplanar reconstructed images and MIPs were obtained and reviewed to evaluate the vascular anatomy. CONTRAST:  9mL ISOVUE-370 IOPAMIDOL (ISOVUE-370) INJECTION 76% COMPARISON:  02/21/2015 FINDINGS: CTA  CHEST FINDINGS Cardiovascular: Replacement of the ascending thoracic aorta. The aortic graft is patent. There is dilatation of the native aortic root measuring roughly 4.2 cm at the sinuses of Valsalva and minimally changed from the previous examination. Again noted is aortic dissection just beyond the ascending aortic graft. Dissection extends into the proximal right brachiocephalic artery and terminates proximal to the right subclavian artery. Great vessels are patent. Typical arch configuration. The left common carotid artery and left subclavian artery appear to be arising from the true lumen. True lumen is along the lateral aspect of the descending thoracic aorta. Dissection extends throughout the descending thoracic aorta into the abdominal aorta. Distal aortic arch measures roughly 3.3 cm in diameter and stable. Proximal descending thoracic aorta measures 3.8 cm and previously measured 3.6 cm. Mid  descending thoracic aorta measures 3.1 cm previously measured 3.0 cm. The aorta at the hiatus measures 3.2 cm and previously measured 3.0 cm. Overall, minimal enlargement of the descending thoracic aorta. Contrast was injected through the left arm. There is filling of multiple small paraspinal veins around the upper thoracic spine. SVC and central veins are patent. Mediastinum/Nodes: Thyroid tissue is mildly heterogeneous. No lymph node enlargement in the mediastinum or hila. No axillary lymph node enlargement. Lungs/Pleura: Trachea and mainstem bronchi are patent. No large pleural effusions. Lungs are clear without airspace disease or consolidation. Musculoskeletal: Median sternotomy wires. No acute bone abnormality. Review of the MIP images confirms the above findings. CTA ABDOMEN AND PELVIS FINDINGS VASCULAR Aorta: Aortic dissection extends throughout the abdominal aorta and terminates just above the bifurcation. Extent of the dissection is unchanged. Aorta at the level of the celiac trunk measures 3.3 cm and previously measured 2.9 cm. Infrarenal abdominal aorta measures 2.2 cm and previously measured 2.1 cm. Incomplete contrast opacification of the false lumen. Celiac: Celiac trunk originates from the true lumen. Celiac trunk is widely patent without significant atherosclerotic disease or stenosis. SMA: SMA region originates from the true lumen. SMA is patent without significant atherosclerotic disease or stenosis. Renals: Right renal artery originates from the true lumen without significant stenosis or plaque. The main left renal artery originates from the false lumen and there is very little contrast in the main left renal artery but this could be related to the timing of the study. Accessory left renal artery originate near the dissection flap and there may be flow from the true lumen. IMA: IMA originates from the true lumen. IMA is patent without significant stenosis or plaque. Inflow: Dissection does not  extend into the iliac arteries. The iliac arteries are patent bilaterally without stenosis or dilatation. Mild atherosclerotic disease in the common iliac arteries. Proximal femoral arteries are patent bilaterally. Veins: No obvious venous abnormality within the limitations of this arterial phase study. Review of the MIP images confirms the above findings. NON-VASCULAR Hepatobiliary: Gallbladder appears to be surgically absent. No gross abnormality to the liver on this arterial phase of imaging. Pancreas: Normal appearance of the pancreas without inflammation or duct dilatation. Spleen: Normal appearance of spleen without enlargement. Adrenals/Urinary Tract: Stable low-density nodule in left adrenal gland that measures 10 Hounsfield units on the noncontrast images. Nodule measures roughly 2.3 cm and most compatible with a benign adenoma. Right adrenal gland is normal. Normal perfusion to the right kidney without hydronephrosis. No suspicious renal lesions. Delayed filling of the left kidney upper pole related to the flow from the false lumen. In addition, there has been interval atrophy of the left upper pole since the prior examination. Previously,  there was a large stone in the left renal pelvis which is no longer present. Negative for left hydronephrosis. Urinary bladder is unremarkable. Stomach/Bowel: Evidence for small hiatal hernia. Normal appearance of the small bowel without obstruction. Few colonic diverticula without acute inflammatory changes. Lymphatic: No lymph node enlargement in the abdomen or pelvis. Reproductive: Uterus has been removed. Evidence for normal ovarian tissue on sequence 22, image 226 and 234. This ovarian tissue appears stable. Other: Trace fluid in the dependent aspect of the pelvis is similar to the previous examination. No free air. Musculoskeletal: No acute bone abnormality. Review of the MIP images confirms the above findings. IMPRESSION: Stable appearance of the surgically  repaired ascending thoracic aorta. Stable extent and configuration of the aortic dissection extending from the thoracic aortic graft to the distal abdominal aorta. Mild enlargement of the descending thoracic aorta and abdominal aorta. Stable enlargement of the native aortic root measuring roughly 4.2 cm. Recommend attention on follow up imaging. Increased atrophy of the left kidney upper pole secondary to flow from the false lumen. No acute abnormality in the chest, abdomen or pelvis. Electronically Signed: By: Markus Daft M.D. On: 04/08/2017 10:44   Ct Angio Abd/pel W/ And/or W/o  Addendum Date: 04/08/2017   ADDENDUM REPORT: 04/08/2017 11:29 ADDENDUM: Study was compared from a CTA on 07/09/2016. The size of the descending thoracic aorta has not significantly changed since 07/09/2016. Atrophy in the left kidney upper pole is also similar to the exam on 07/09/2016. Electronically Signed   By: Markus Daft M.D.   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 Follow-up aortic dissection. EXAM: CT ANGIOGRAPHY CHEST, ABDOMEN AND PELVIS TECHNIQUE: Multidetector CT imaging through the chest, abdomen and pelvis was performed using the standard protocol during bolus administration of intravenous contrast. Multiplanar reconstructed images and MIPs were obtained and reviewed to evaluate the vascular anatomy. CONTRAST:  77mL ISOVUE-370 IOPAMIDOL (ISOVUE-370) INJECTION 76% COMPARISON:  02/21/2015 FINDINGS: CTA CHEST FINDINGS Cardiovascular: Replacement of the ascending thoracic aorta. The aortic graft is patent. There is dilatation of the native aortic root measuring roughly 4.2 cm at the sinuses of Valsalva and minimally changed from the previous examination. Again noted is aortic dissection just beyond the ascending aortic graft. Dissection extends into the proximal right brachiocephalic artery and terminates  proximal to the right subclavian artery. Great vessels are patent. Typical arch configuration. The left common carotid artery and left subclavian artery appear to be arising from the true lumen. True lumen is along the lateral aspect of the descending thoracic aorta. Dissection extends throughout the descending thoracic aorta into the abdominal aorta. Distal aortic arch measures roughly 3.3 cm in diameter and stable. Proximal descending thoracic aorta measures 3.8 cm and previously measured 3.6 cm. Mid descending thoracic aorta measures 3.1 cm previously measured 3.0 cm. The aorta at the hiatus measures 3.2 cm and previously measured 3.0 cm. Overall, minimal enlargement of the descending thoracic aorta. Contrast was injected through the left arm. There is filling of multiple small paraspinal veins around the upper thoracic spine. SVC and central veins are patent. Mediastinum/Nodes: Thyroid tissue is mildly heterogeneous. No lymph node enlargement in the mediastinum or hila. No axillary lymph node enlargement. Lungs/Pleura: Trachea and mainstem bronchi are patent. No large pleural effusions. Lungs are clear without airspace disease or consolidation. Musculoskeletal: Median sternotomy wires. No acute bone abnormality. Review of the MIP images confirms the  above findings. CTA ABDOMEN AND PELVIS FINDINGS VASCULAR Aorta: Aortic dissection extends throughout the abdominal aorta and terminates just above the bifurcation. Extent of the dissection is unchanged. Aorta at the level of the celiac trunk measures 3.3 cm and previously measured 2.9 cm. Infrarenal abdominal aorta measures 2.2 cm and previously measured 2.1 cm. Incomplete contrast opacification of the false lumen. Celiac: Celiac trunk originates from the true lumen. Celiac trunk is widely patent without significant atherosclerotic disease or stenosis. SMA: SMA region originates from the true lumen. SMA is patent without significant atherosclerotic disease or  stenosis. Renals: Right renal artery originates from the true lumen without significant stenosis or plaque. The main left renal artery originates from the false lumen and there is very little contrast in the main left renal artery but this could be related to the timing of the study. Accessory left renal artery originate near the dissection flap and there may be flow from the true lumen. IMA: IMA originates from the true lumen. IMA is patent without significant stenosis or plaque. Inflow: Dissection does not extend into the iliac arteries. The iliac arteries are patent bilaterally without stenosis or dilatation. Mild atherosclerotic disease in the common iliac arteries. Proximal femoral arteries are patent bilaterally. Veins: No obvious venous abnormality within the limitations of this arterial phase study. Review of the MIP images confirms the above findings. NON-VASCULAR Hepatobiliary: Gallbladder appears to be surgically absent. No gross abnormality to the liver on this arterial phase of imaging. Pancreas: Normal appearance of the pancreas without inflammation or duct dilatation. Spleen: Normal appearance of spleen without enlargement. Adrenals/Urinary Tract: Stable low-density nodule in left adrenal gland that measures 10 Hounsfield units on the noncontrast images. Nodule measures roughly 2.3 cm and most compatible with a benign adenoma. Right adrenal gland is normal. Normal perfusion to the right kidney without hydronephrosis. No suspicious renal lesions. Delayed filling of the left kidney upper pole related to the flow from the false lumen. In addition, there has been interval atrophy of the left upper pole since the prior examination. Previously, there was a large stone in the left renal pelvis which is no longer present. Negative for left hydronephrosis. Urinary bladder is unremarkable. Stomach/Bowel: Evidence for small hiatal hernia. Normal appearance of the small bowel without obstruction. Few colonic  diverticula without acute inflammatory changes. Lymphatic: No lymph node enlargement in the abdomen or pelvis. Reproductive: Uterus has been removed. Evidence for normal ovarian tissue on sequence 22, image 226 and 234. This ovarian tissue appears stable. Other: Trace fluid in the dependent aspect of the pelvis is similar to the previous examination. No free air. Musculoskeletal: No acute bone abnormality. Review of the MIP images confirms the above findings. IMPRESSION: Stable appearance of the surgically repaired ascending thoracic aorta. Stable extent and configuration of the aortic dissection extending from the thoracic aortic graft to the distal abdominal aorta. Mild enlargement of the descending thoracic aorta and abdominal aorta. Stable enlargement of the native aortic root measuring roughly 4.2 cm. Recommend attention on follow up imaging. Increased atrophy of the left kidney upper pole secondary to flow from the false lumen. No acute abnormality in the chest, abdomen or pelvis. Electronically Signed: By: Markus Daft M.D. On: 04/08/2017 10:44    Ct Angio Chest/abd/pel For Dissection W And/or W/wo  Result Date: 03/08/2016 CLINICAL DATA:  64 year old female with chest tightness. History of type 1 aortic dissection repair. EXAM: CT ANGIOGRAPHY CHEST, ABDOMEN AND PELVIS TECHNIQUE: Multidetector CT imaging through the chest, abdomen and pelvis was  performed using the standard protocol during bolus administration of intravenous contrast. Multiplanar reconstructed images and MIPs were obtained and reviewed to evaluate the vascular anatomy. CONTRAST:  80 cc Isovue 370 COMPARISON:  Chest radiograph dated 03/07/2016 and CT dated 02/21/2015 FINDINGS: CTA CHEST FINDINGS Cardiovascular: There is a stable moderate cardiomegaly. No pericardial effusion. Type A aortic dissection again noted with postsurgical changes of repair of the root of the aorta. The ascending aortic graft appears unremarkable and similar to the  prior radiograph. There is no extravasation of contrast. There is contrast in both true and false lumen. There is extension of the dissection flap into the proximal portion of the right innominate artery. This findings are similar to prior CT. The origins of the great vessels of the aortic arch appear to arise from the true lumen. The central pulmonary arteries appear unremarkable and patent. There is however a small pulmonary embolus in the subsegmental branch point of the right lower lobe (series 601 image 83 and coronal series 602, image 105). This clot appears nonocclusive and may be chronic in nature and represent scarring. Correlation with clinical exam and history of prior TS recommended. Mediastinum/Nodes: There is no hilar or mediastinal adenopathy. The esophagus is grossly unremarkable. Lungs/Pleura: The lungs are clear. There is no pleural effusion or pneumothorax. The central airways are patent. Musculoskeletal: Median sternotomy wires. No acute osseous pathology. Review of the MIP images confirms the above findings. CTA ABDOMEN AND PELVIS FINDINGS VASCULAR Aorta: Type A dissection extends just above the aortic bifurcation. There is contrast in the false lumen although appears less dense compared to the contrast within the true lumen. Celiac: The origin of the celiac axis arises from the true lumen and appears patent. SMA: The origin of the SMA appears patent and arises from the true lumen. Renals: The right renal artery appears patent and arises from the true lumen. The left renal artery is patent as well. The origin of the left renal artery is not seen with certainty. IMA: The IMA is patent and arises from the true lumen. Inflow: Mild atherosclerotic calcification of the common iliac arteries. The common iliac external and internal iliac arteries are patent. Veins: No obvious venous abnormality within the limitations of this arterial phase study. Review of the MIP images confirms the above findings. No  intra-abdominal free air.  Trace fluid within the pelvis NON-VASCULAR Hepatobiliary: Cholecystectomy. The liver is unremarkable. No intrahepatic biliary ductal dilatation. Pancreas: Unremarkable. No pancreatic ductal dilatation or surrounding inflammatory changes. Spleen: Normal in size without focal abnormality. Adrenals/Urinary Tract: Stable 2.2 cm left adrenal adenoma. The right adrenal gland is unremarkable. There is left renal lower pole atrophy and scarring. The right kidney is unremarkable. There is no hydronephrosis on either side. The visualized ureters and urinary bladder appear unremarkable. Stomach/Bowel: There is colonic diverticulosis without active inflammatory changes. Moderate amount of stool noted throughout the colon. There is no evidence of bowel obstruction or active inflammation. The appendix is not visualized with certainty. No inflammatory changes identified in the right lower quadrant. Lymphatic: No adenopathy. Reproductive: Hysterectomy. Other: None Musculoskeletal: Degenerate changes of the spine. No acute fracture. Review of the MIP images confirms the above findings. IMPRESSION: 1. Stable appearing type A aortic dissection status post prior repair of the aortic root. Overall the appearance of the aorta and dissection is similar to the prior study. No periaortic inflammation or extravasation of contrast. 2. Small pulmonary embolism involving the subsegmental branch point of the right lower lobe. This may represent an  acute nonocclusive PE versus chronic scarring. Correlation with clinical exam and history of prior PE recommended. 3. Moderate cardiomegaly similar to prior CT. 4. Colonic diverticulosis without active inflammatory changes. No bowel obstruction. 5. Trace free fluid within the pelvis of indeterminate etiology. These results were called by telephone at the time of interpretation on 03/08/2016 at 3:01 am to Dr. Tennis Must , who verbally acknowledged these results. Electronically  Signed   By: Anner Crete M.D.   On: 03/08/2016 03:08    Ct Angio Chest Aorta W/cm &/or Wo/cm  02/21/2015  CLINICAL DATA:  History of aortic dissection with prior surgeon EXAM: CT ANGIOGRAPHY CHEST CT ABDOMEN AND PELVIS WITH CONTRAST TECHNIQUE: Multidetector CT imaging of the chest was performed using the standard protocol during bolus administration of intravenous contrast. Multiplanar CT image reconstructions and MIPs were obtained to evaluate the vascular anatomy. Multidetector CT imaging of the abdomen and pelvis was performed using the standard protocol during bolus administration of intravenous contrast. CONTRAST:  75 mL Isovue 370 COMPARISON:  None. FINDINGS: CTA CHEST FINDINGS There are changes consistent with the patient's known history of type 1 aortic dissection with interval surgical repair of the ascending aorta. The ascending tube graft appears well placed without extravasation. Flow into both the false and true lumens is noted. The dissection flap extends into the proximal portion of the innominate artery on the right similar that seen on the prior exam. The false lumen again supplies flow to the left common carotid artery and left subclavian artery. The lungs are well aerated bilaterally. No sizable hilar or mediastinal adenopathy is noted. Bilateral thyroid nodules are again noted stable. No bony abnormality is noted. CT ABDOMEN and PELVIS FINDINGS The liver demonstrates some diffuse decreased attenuation consistent with fatty infiltration. The gallbladder has been surgically removed. The spleen, pancreas and right adrenal gland are within normal limits. A hypodense lesion is noted arising from the left adrenal gland which is stable in appearance likely representing an adenoma. The right kidney demonstrates a normal enhancement pattern. The left kidney demonstrates decreased enhancement similar to that seen on the prior exam. The dominant left renal artery is noted arising from the true lumen  and occluding just beyond its origin. An accessory left renal artery is noted arising from the false lumen and remains patent. Relative sparing of the lower pole of the left kidney is noted. A large left renal pelvis stone as well as a smaller lower pole stone are again identified and stable. The bladder is partially distended. No pelvic mass lesion is noted. Scattered diverticular change of the colon is noted. No acute bony abnormality is noted. Vascular: The false lumen of the dissection again supplies the celiac axis, superior mesenteric artery, right renal artery and inferior mesenteric artery. Flow to these vessels is widely patent. The dissection flap extends to just above the aortic bifurcation. Some fenestrations in the distal flap are noted. Flow via the false lumen into the iliac arteries is noted bilaterally. The previously seen extension of the dissection into the left common iliac artery is no longer identified. Review of the MIP images confirms the above findings. 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 The tube graft is widely patent with flow into both the true and false lumens. The dissection flap terminates just above the aortic bifurcation with improved flow into the left iliac artery when compared with the prior exam. No new focal dissection or abnormality is noted. Visceral flow is similar  to that noted on the prior exam. Left renal calculi without obstructive change. 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 No acute abnormality is noted. Electronically Signed   By: Inez Catalina M.D.   On: 02/21/2015 13:39      Recent Lab Findings: Lab Results  Component Value Date   WBC 6.5 02/02/2019   HGB 12.8 02/02/2019   HCT 42.2 02/02/2019   PLT 229 02/02/2019   GLUCOSE 104 (H) 02/02/2019   CHOL 146 09/06/2018   TRIG  61 09/06/2018   HDL 53 09/06/2018   LDLCALC 81 09/06/2018   ALT 14 09/06/2018   AST 12 09/06/2018   NA 137 02/02/2019   K 4.6 02/02/2019   CL 103 02/02/2019   CREATININE 1.33 (H) 02/02/2019   BUN 16 02/02/2019   CO2 26 02/02/2019   TSH 0.968 08/31/2016   INR 0.94 04/26/2015   HGBA1C 6.0 (H) 08/31/2016     Echo 04/02/2019   Left ventricular ejection fraction, by estimation, is 55 to 60%. The left ventricle has normal function. The left ventricle has no regional wall motion abnormalities. There is moderate left ventricular hypertrophy of the basal-septal segment. Left ventricular diastolic parameters are indeterminate. 2. Right ventricular systolic function is normal. The right ventricular size is normal. 3. The mitral valve is normal in structure. Mild mitral valve regurgitation. No evidence of mitral stenosis. 4. The aortic valve is tricuspid. There is moderate thickening and moderate calcification of the aortic valve. Aortic valve regurgitation is mild. Aortic regurgitation PHT measures 505 msec. No aortic stenosis is present. 5. Aortic dilatation noted and root/ascending aorta has been repaired/replaced. There is mild dilatation of the aortic root measuring 43 mm. 6. The inferior vena cava is normal in size with greater than 50% respiratory variability, suggesting right atrial pressure of 3 mmHg. 7. Compared to echo 03/2018 the aortic root diameter has increased from 4 to 4.3cm.   Assessment / Plan:   Patient is status post replacement of a sending aorta and resuspension of her aortic valve for type I aortic dissection in 2016.  CTA of the chest shows stable surgical repair of the ascending aorta with persistent dissection flap extending into the distal abdominal aortia but without evidence of progressive enlargement.   She will continue with cardiology follow-up, currently Dr. Radford Pax has been getting yearly echocardiograms and CTA of the chest-which appears  appropriate    Grace Isaac MD      [address] [place] [address] Office [phone]   Beeper [phone]  11/30/2019 11:24 AM

## 2019-12-27 ENCOUNTER — Other Ambulatory Visit: Payer: Self-pay

## 2019-12-27 ENCOUNTER — Ambulatory Visit
Admission: RE | Admit: 2019-12-27 | Discharge: 2019-12-27 | Disposition: A | Discharge status: Home / Self Care | Payer: Medicare Other | Source: Ambulatory Visit | Attending: Internal Medicine | Admitting: Internal Medicine

## 2019-12-27 DIAGNOSIS — Z1231 Encounter for screening mammogram for malignant neoplasm of breast: Secondary | ICD-10-CM

## 2020-01-13 DIAGNOSIS — I82409 Acute embolism and thrombosis of unspecified deep veins of unspecified lower extremity: Secondary | ICD-10-CM

## 2020-01-13 HISTORY — DX: Acute embolism and thrombosis of unspecified deep veins of unspecified lower extremity: I82.409

## 2020-01-22 ENCOUNTER — Telehealth: Payer: Self-pay

## 2020-01-22 NOTE — Telephone Encounter (Signed)
-----   Message from Burtis Junes, NP sent at 01/22/2020  9:21 AM EST ----- Regarding: FW: BMET Needed It is not needed - please cancel.   Cecille Rubin ----- Message ----- From: Dollene Primrose, RN Sent: 01/22/2020   9:10 AM EST To: Burtis Junes, NP, Olean Ree, RT Subject: RE: BMET Needed                                Good Morning!  This pt had a repeat scan done in November with Dr. Servando Snare. She wants to know if this upcoming scan is still necessary.  Thanks!  Damoney Julia   ----- Message ----- From: Olean Ree, RT Sent: 01/22/2020   8:52 AM EST To: Rebeca Alert [facility] Triage Subject: BMET Needed                                    Can you please place a BMET on this patient of DR. Turner?   Thanks! Nunzio Cobbs

## 2020-01-22 NOTE — Telephone Encounter (Signed)
CT angio canceled per Truitt Merle. Pt had scan performed in Nov 2021 prior to follow up with Dr. Servando Snare. Pt in need of her annual follow up, no recall was in place. Appt scheduled for March 2022 with Dr. Radford Pax for annual visit and eval for annual echo per Dr. Janus Molder in Nov 2021.

## 2020-02-07 ENCOUNTER — Inpatient Hospital Stay: Admission: RE | Admit: 2020-02-07 | Payer: Medicare Other | Source: Ambulatory Visit

## 2020-03-12 DIAGNOSIS — I2609 Other pulmonary embolism with acute cor pulmonale: Secondary | ICD-10-CM

## 2020-03-12 HISTORY — DX: Other pulmonary embolism with acute cor pulmonale: I26.09

## 2020-04-01 ENCOUNTER — Other Ambulatory Visit: Payer: Self-pay

## 2020-04-01 ENCOUNTER — Emergency Department (HOSPITAL_COMMUNITY): Payer: Medicare Other

## 2020-04-01 ENCOUNTER — Other Ambulatory Visit (HOSPITAL_COMMUNITY): Payer: Medicare Other

## 2020-04-01 ENCOUNTER — Inpatient Hospital Stay (HOSPITAL_COMMUNITY)
Admission: EM | Admit: 2020-04-01 | Discharge: 2020-04-04 | DRG: 175 | Disposition: A | Discharge status: Home / Self Care | Payer: Medicare Other | Attending: Internal Medicine | Admitting: Internal Medicine

## 2020-04-01 ENCOUNTER — Encounter (HOSPITAL_COMMUNITY): Payer: Self-pay | Admitting: Emergency Medicine

## 2020-04-01 DIAGNOSIS — Z800 Family history of malignant neoplasm of digestive organs: Secondary | ICD-10-CM

## 2020-04-01 DIAGNOSIS — I2510 Atherosclerotic heart disease of native coronary artery without angina pectoris: Secondary | ICD-10-CM | POA: Diagnosis present

## 2020-04-01 DIAGNOSIS — I1310 Hypertensive heart and chronic kidney disease without heart failure, with stage 1 through stage 4 chronic kidney disease, or unspecified chronic kidney disease: Secondary | ICD-10-CM | POA: Diagnosis present

## 2020-04-01 DIAGNOSIS — I2602 Saddle embolus of pulmonary artery with acute cor pulmonale: Secondary | ICD-10-CM | POA: Diagnosis present

## 2020-04-01 DIAGNOSIS — I2692 Saddle embolus of pulmonary artery without acute cor pulmonale: Secondary | ICD-10-CM

## 2020-04-01 DIAGNOSIS — D509 Iron deficiency anemia, unspecified: Secondary | ICD-10-CM | POA: Diagnosis present

## 2020-04-01 DIAGNOSIS — I2583 Coronary atherosclerosis due to lipid rich plaque: Secondary | ICD-10-CM

## 2020-04-01 DIAGNOSIS — Z952 Presence of prosthetic heart valve: Secondary | ICD-10-CM | POA: Diagnosis not present

## 2020-04-01 DIAGNOSIS — I248 Other forms of acute ischemic heart disease: Secondary | ICD-10-CM | POA: Diagnosis present

## 2020-04-01 DIAGNOSIS — Z20822 Contact with and (suspected) exposure to covid-19: Secondary | ICD-10-CM | POA: Diagnosis present

## 2020-04-01 DIAGNOSIS — N1831 Chronic kidney disease, stage 3a: Secondary | ICD-10-CM | POA: Diagnosis present

## 2020-04-01 DIAGNOSIS — Z808 Family history of malignant neoplasm of other organs or systems: Secondary | ICD-10-CM

## 2020-04-01 DIAGNOSIS — Z9049 Acquired absence of other specified parts of digestive tract: Secondary | ICD-10-CM | POA: Diagnosis not present

## 2020-04-01 DIAGNOSIS — Z8249 Family history of ischemic heart disease and other diseases of the circulatory system: Secondary | ICD-10-CM | POA: Diagnosis not present

## 2020-04-01 DIAGNOSIS — Z79899 Other long term (current) drug therapy: Secondary | ICD-10-CM | POA: Diagnosis not present

## 2020-04-01 DIAGNOSIS — Z66 Do not resuscitate: Secondary | ICD-10-CM | POA: Diagnosis present

## 2020-04-01 DIAGNOSIS — R0600 Dyspnea, unspecified: Secondary | ICD-10-CM | POA: Insufficient documentation

## 2020-04-01 DIAGNOSIS — G4733 Obstructive sleep apnea (adult) (pediatric): Secondary | ICD-10-CM | POA: Diagnosis present

## 2020-04-01 DIAGNOSIS — I2699 Other pulmonary embolism without acute cor pulmonale: Secondary | ICD-10-CM

## 2020-04-01 DIAGNOSIS — Z803 Family history of malignant neoplasm of breast: Secondary | ICD-10-CM

## 2020-04-01 DIAGNOSIS — I252 Old myocardial infarction: Secondary | ICD-10-CM | POA: Diagnosis not present

## 2020-04-01 DIAGNOSIS — J9601 Acute respiratory failure with hypoxia: Secondary | ICD-10-CM | POA: Diagnosis present

## 2020-04-01 DIAGNOSIS — I10 Essential (primary) hypertension: Secondary | ICD-10-CM | POA: Diagnosis present

## 2020-04-01 DIAGNOSIS — Z90710 Acquired absence of both cervix and uterus: Secondary | ICD-10-CM

## 2020-04-01 DIAGNOSIS — I420 Dilated cardiomyopathy: Secondary | ICD-10-CM | POA: Diagnosis present

## 2020-04-01 DIAGNOSIS — I82461 Acute embolism and thrombosis of right calf muscular vein: Secondary | ICD-10-CM | POA: Diagnosis present

## 2020-04-01 DIAGNOSIS — Z7982 Long term (current) use of aspirin: Secondary | ICD-10-CM | POA: Diagnosis not present

## 2020-04-01 DIAGNOSIS — Z9889 Other specified postprocedural states: Secondary | ICD-10-CM

## 2020-04-01 DIAGNOSIS — Z9989 Dependence on other enabling machines and devices: Secondary | ICD-10-CM

## 2020-04-01 DIAGNOSIS — Z86718 Personal history of other venous thrombosis and embolism: Secondary | ICD-10-CM | POA: Diagnosis not present

## 2020-04-01 DIAGNOSIS — E785 Hyperlipidemia, unspecified: Secondary | ICD-10-CM | POA: Diagnosis present

## 2020-04-01 DIAGNOSIS — I82451 Acute embolism and thrombosis of right peroneal vein: Secondary | ICD-10-CM | POA: Diagnosis present

## 2020-04-01 DIAGNOSIS — K219 Gastro-esophageal reflux disease without esophagitis: Secondary | ICD-10-CM | POA: Diagnosis present

## 2020-04-01 LAB — CBC WITH DIFFERENTIAL/PLATELET
Abs Immature Granulocytes: 0.04 10*3/uL (ref 0.00–0.07)
Basophils Absolute: 0 10*3/uL (ref 0.0–0.1)
Basophils Relative: 1 %
Eosinophils Absolute: 0.2 10*3/uL (ref 0.0–0.5)
Eosinophils Relative: 4 %
HCT: 40.6 % (ref 36.0–46.0)
Hemoglobin: 12.5 g/dL (ref 12.0–15.0)
Immature Granulocytes: 1 %
Lymphocytes Relative: 28 %
Lymphs Abs: 1.8 10*3/uL (ref 0.7–4.0)
MCH: 22.9 pg — ABNORMAL LOW (ref 26.0–34.0)
MCHC: 30.8 g/dL (ref 30.0–36.0)
MCV: 74.5 fL — ABNORMAL LOW (ref 80.0–100.0)
Monocytes Absolute: 0.5 10*3/uL (ref 0.1–1.0)
Monocytes Relative: 8 %
Neutro Abs: 3.7 10*3/uL (ref 1.7–7.7)
Neutrophils Relative %: 58 %
Platelets: 164 10*3/uL (ref 150–400)
RBC: 5.45 MIL/uL — ABNORMAL HIGH (ref 3.87–5.11)
RDW: 15.2 % (ref 11.5–15.5)
WBC: 6.3 10*3/uL (ref 4.0–10.5)
nRBC: 0 % (ref 0.0–0.2)

## 2020-04-01 LAB — RESP PANEL BY RT-PCR (FLU A&B, COVID) ARPGX2
Influenza A by PCR: NEGATIVE
Influenza B by PCR: NEGATIVE
SARS Coronavirus 2 by RT PCR: NEGATIVE

## 2020-04-01 LAB — COMPREHENSIVE METABOLIC PANEL
ALT: 21 U/L (ref 0–44)
AST: 18 U/L (ref 15–41)
Albumin: 3.5 g/dL (ref 3.5–5.0)
Alkaline Phosphatase: 69 U/L (ref 38–126)
Anion gap: 5 (ref 5–15)
BUN: 13 mg/dL (ref 8–23)
CO2: 23 mmol/L (ref 22–32)
Calcium: 9 mg/dL (ref 8.9–10.3)
Chloride: 107 mmol/L (ref 98–111)
Creatinine, Ser: 1.34 mg/dL — ABNORMAL HIGH (ref 0.44–1.00)
GFR, Estimated: 44 mL/min — ABNORMAL LOW (ref >60)
Glucose, Bld: 138 mg/dL — ABNORMAL HIGH (ref 70–99)
Potassium: 4 mmol/L (ref 3.5–5.1)
Sodium: 135 mmol/L (ref 135–145)
Total Bilirubin: 0.4 mg/dL (ref 0.3–1.2)
Total Protein: 6.4 g/dL — ABNORMAL LOW (ref 6.5–8.1)

## 2020-04-01 LAB — ECHOCARDIOGRAM COMPLETE: S' Lateral: 2.7 cm

## 2020-04-01 LAB — TROPONIN I (HIGH SENSITIVITY)
Troponin I (High Sensitivity): 54 ng/L — ABNORMAL HIGH (ref <18)
Troponin I (High Sensitivity): 54 ng/L — ABNORMAL HIGH (ref <18)

## 2020-04-01 LAB — HEPARIN LEVEL (UNFRACTIONATED)
Heparin Unfractionated: 1.1 IU/mL — ABNORMAL HIGH (ref 0.30–0.70)
Heparin Unfractionated: 0.93 IU/mL — ABNORMAL HIGH (ref 0.30–0.70)

## 2020-04-01 LAB — HIV ANTIBODY (ROUTINE TESTING W REFLEX): HIV Screen 4th Generation wRfx: NONREACTIVE

## 2020-04-01 MED ORDER — ASPIRIN 81 MG PO CHEW
324.0000 mg | CHEWABLE_TABLET | Freq: Once | ORAL | Status: AC
  Administered 2020-04-01: 324 mg via ORAL
  Filled 2020-04-01: qty 4

## 2020-04-01 MED ORDER — FOLIC ACID 1 MG PO TABS
1.0000 mg | ORAL_TABLET | Freq: Every day | ORAL | Status: DC
  Administered 2020-04-02 – 2020-04-04 (×3): 1 mg via ORAL
  Filled 2020-04-01 (×3): qty 1

## 2020-04-01 MED ORDER — IOHEXOL 350 MG/ML SOLN
100.0000 mL | Freq: Once | INTRAVENOUS | Status: AC | PRN
  Administered 2020-04-01: 100 mL via INTRAVENOUS

## 2020-04-01 MED ORDER — HEPARIN BOLUS VIA INFUSION
5000.0000 [IU] | Freq: Once | INTRAVENOUS | Status: AC
  Administered 2020-04-01: 5000 [IU] via INTRAVENOUS
  Filled 2020-04-01: qty 5000

## 2020-04-01 MED ORDER — CARVEDILOL 12.5 MG PO TABS: 12.5000 mg | ORAL_TABLET | Freq: Two times a day (BID) | ORAL | Status: DC

## 2020-04-01 MED ORDER — ASPIRIN EC 81 MG PO TBEC
81.0000 mg | DELAYED_RELEASE_TABLET | Freq: Every day | ORAL | Status: DC
Start: 2020-04-01 — End: 2020-04-04
  Administered 2020-04-02 – 2020-04-04 (×3): 81 mg via ORAL
  Filled 2020-04-01 (×3): qty 1

## 2020-04-01 MED ORDER — DICYCLOMINE HCL 20 MG PO TABS
20.0000 mg | ORAL_TABLET | Freq: Three times a day (TID) | ORAL | Status: DC | PRN
  Filled 2020-04-01: qty 1

## 2020-04-01 MED ORDER — AMLODIPINE BESYLATE 5 MG PO TABS: 10.0000 mg | ORAL_TABLET | Freq: Every day | ORAL | Status: DC

## 2020-04-01 MED ORDER — ACETAMINOPHEN 325 MG PO TABS
650.0000 mg | ORAL_TABLET | Freq: Four times a day (QID) | ORAL | Status: DC | PRN
  Administered 2020-04-02 – 2020-04-03 (×2): 650 mg via ORAL
  Filled 2020-04-01 (×2): qty 2

## 2020-04-01 MED ORDER — SODIUM CHLORIDE 0.9% FLUSH
3.0000 mL | Freq: Two times a day (BID) | INTRAVENOUS | Status: DC
  Administered 2020-04-01 – 2020-04-04 (×5): 3 mL via INTRAVENOUS

## 2020-04-01 MED ORDER — HEPARIN (PORCINE) 25000 UT/250ML-% IV SOLN
950.0000 [IU]/h | INTRAVENOUS | Status: DC
  Administered 2020-04-01: 1200 [IU]/h via INTRAVENOUS
  Administered 2020-04-02: 950 [IU]/h via INTRAVENOUS
  Filled 2020-04-01 (×3): qty 250

## 2020-04-01 MED ORDER — ATORVASTATIN CALCIUM 40 MG PO TABS
40.0000 mg | ORAL_TABLET | Freq: Every evening | ORAL | Status: DC
  Administered 2020-04-01 – 2020-04-03 (×3): 40 mg via ORAL
  Filled 2020-04-01 (×3): qty 1

## 2020-04-01 MED ORDER — ACETAMINOPHEN 650 MG RE SUPP: 650.0000 mg | Freq: Four times a day (QID) | RECTAL | Status: DC | PRN

## 2020-04-01 MED ORDER — COLESEVELAM HCL 625 MG PO TABS
625.0000 mg | ORAL_TABLET | Freq: Two times a day (BID) | ORAL | Status: DC
  Administered 2020-04-01 – 2020-04-04 (×6): 625 mg via ORAL
  Filled 2020-04-01 (×7): qty 1

## 2020-04-01 NOTE — Progress Notes (Signed)
  Echocardiogram 2D Echocardiogram has been performed.  Alexis Wheeler 04/01/2020, 1:42 PM

## 2020-04-01 NOTE — ED Notes (Signed)
Patient transported to CT 

## 2020-04-01 NOTE — Progress Notes (Signed)
VASCULAR LAB    Bilateral lower extremity venous duplex has been performed.  See CV proc for preliminary results.   Janayla Marik, RVT 04/01/2020, 3:12 PM

## 2020-04-01 NOTE — ED Provider Notes (Signed)
Alexis Wheeler   CSN: [id number] Arrival date & time: 04/01/20  0732     History Chief Complaint  Patient presents with  . Shortness of Breath    Alexis Wheeler is a 65 y.o. female.  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  Reports she thinks she "overdid it" this week cleaning. Saturday developed some shortness of breath with exertion but reports it is significantly worsening and she has dyspnea just attempting to walk to the bathroom. Had some sensation in her left arm yesterday but no other chest pain, back pain, neck pain, jaw pain or arm pain.  Reports hx of DVT but it was small and she is not on anticoagulation.  Denies any recent leg pain or swelling, travel, surgeries, immobilization.  No fever. Has had occasional cough chronically.   Past Medical History:  Diagnosis Date  . Anemia    childhood  . Arthritis    right knee  . Ascending aortic dissection [facility])    s/p repair by Dr. Servando Snare  . Blood transfusion without reported diagnosis    with heart surgery-repair aorta  . CAD (coronary artery disease)    a. 04/2015: cath showed 30% Ost RCA stenosis, 20% mid-LAD stenosis, and normal LV function.   . Cataract    bilateral eyes, per pt.  . Congestive dilated cardiomyopathy ([place]) 08/01/2014  . DVT (deep venous thrombosis) ([place])    right  . GERD   . HEMATURIA UNSPECIFIED   . History of kidney stones   . HYPERLIPIDEMIA   . HYPERTENSION   . Morbid obesity ([place])   . Myocardial infarction ([place])    01/30/2014  . MYOSITIS   . OSA on CPAP 05/10/2017   mild OSA  with an AHI of 5.2/hr overall but severe during REM sleep with an AHI of 41.4/hr and underwent CPAP titration to 12cm H2O  . Sleep apnea    cpap-  doesnt use it- last study years ago  . Vertigo     Patient Active Problem List   Diagnosis Date Noted  . Acute pulmonary embolism ([place]) 04/01/2020  . Saddle embolus of pulmonary artery ([place])   . Dyspnea   . Ascending aortic dissection ([place])   . OSA on CPAP 05/10/2017  . Benign essential HTN 05/10/2017  . Coronary artery disease 03/08/2016  . Diastolic dysfunction without heart failure 03/08/2016  . Chest pain 03/07/2016  . Fatigue 04/30/2015  . Hypokalemia 12/20/2014  . Thoracoabdominal aortic dissection ([place]) 08/01/2014  . Dilated cardiomyopathy ([place]) 08/01/2014  . Plantar fasciitis of left foot 05/02/2014  . Non-compliant behavior 03/27/2014  . S/P aortic dissection repair 03/03/2014  . Lumbosacral spondylosis without myelopathy 01/26/2014  . Greater trochanteric bursitis of right hip 10/10/2013  . Chronic low back pain 09/26/2013  . Chronic right hip pain 09/26/2013  . Allergic rhinitis 08/29/2012  . Kidney stone 12/31/2010  . Morbid obesity ([place]) 12/18/2009  . GERD 12/18/2009  . HEMATURIA UNSPECIFIED 12/17/2009  . Hyperlipidemia with target LDL less than 70 12/16/2009  . Hypertensive heart disease 12/16/2009    Past Surgical History:  Procedure Laterality Date  . ABDOMINAL HYSTERECTOMY  1983  . CARDIAC  CATHETERIZATION N/A 04/30/2015   Procedure: Left Heart Cath and Coronary Angiography;  Surgeon: Peter M Martinique, MD;  Location: [facility];  Service: Cardiovascular;  Laterality: N/A;  . CHOLECYSTECTOMY  2001  . CYSTOSCOPY W/ RETROGRADES Left 08/02/2015   Procedure: CYSTOSCOPY WITH LEFT RETROGRADE PYELOGRAM;  Surgeon: Festus Aloe, MD;  Location: [facility];  Service: Urology;  Laterality: Left;  . CYSTOSCOPY WITH URETEROSCOPY, STONE BASKETRY AND STENT PLACEMENT Left 08/02/2015   Procedure:  Mitzi Davenport ;  Surgeon:  Festus Aloe, MD;  Location: [facility];  Service: Urology;  Laterality: Left;  . CYSTOSCOPY/URETEROSCOPY/HOLMIUM LASER/STENT PLACEMENT Left 08/02/2015   Procedure: CYSTOSCOPY/URETEROSCOPY/HOLMIUM LASER/STENT PLACEMENT-LEFT;  Surgeon: Festus Aloe, MD;  Location: [facility];  Service: Urology;  Laterality: Left;  . HOLMIUM LASER APPLICATION Left 7/67/3419   Procedure: HOLMIUM LASER APPLICATION;  Surgeon: Festus Aloe, MD;  Location: [facility];  Service: Urology;  Laterality: Left;  . REPLACEMENT ASCENDING AORTA N/A 03/03/2014   Procedure: REPAIR OF ASCENDING AORTIC DISSECTION;  Surgeon: Grace Isaac, MD;  Location: [facility];  Service: Open Heart Surgery;  Laterality: N/A;     OB History   No obstetric history on file.     Family History  Problem Relation Age of Onset  . Breast cancer Mother 84  . Aortic dissection Mother 55  . Hypertension Mother   . Throat cancer Brother        1 bro living  . Colon cancer Brother   . Esophageal cancer Brother   . Hypertension Father   . Breast cancer Sister   . Hypertension Sister   . Hypertension Daughter   . Seizures Neg Hx   . Liver cancer Neg Hx   . Pancreatic cancer Neg Hx   . Rectal cancer Neg Hx   . Stomach cancer Neg Hx     Social History   Tobacco Use  . Smoking status: Never Smoker  . Smokeless tobacco: Never Used  . 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 Vaping Use: Never used  Substance Use Topics  . Alcohol use: No  . Drug use: No    Home Medications Prior to Admission medications   Medication Sig Start Date End Date Taking? Authorizing Provider  amLODipine (NORVASC) 10 MG tablet Take 1 tablet (10 mg total) by mouth daily. 09/04/19  Yes Sueanne Margarita, MD  aspirin EC 81 MG EC tablet Take 1 tablet (81 mg total) by mouth daily. 03/14/14  Yes Collins, Gina L, PA-C  atorvastatin (LIPITOR) 40 MG tablet TAKE 1 TABLET BY MOUTH ONCE DAILY  AT 6:00 IN THE  EVENING Patient taking differently: Take 40 mg by mouth every evening. 11/06/19  Yes Sueanne Margarita, MD  carvedilol (COREG) 12.5 MG tablet Take 1 tablet by mouth twice daily 01/23/19  Yes Burtis Junes, NP  colesevelam [facility] 625 MG tablet Take one tablet before lunch and bedtime.  Avoid taking within 2-3 hours of other medications to prevent interaction 06/05/19  Yes Nandigam, Venia Minks, MD  diclofenac Sodium (VOLTAREN) 1 % GEL Apply 2-4 g topically 4 (four) times daily as needed (pain). 12/26/18  Yes [provider]  dicyclomine (BENTYL) 20 MG tablet Take 1 tablet (20 mg total) by mouth 3 (three) times daily as needed for spasms. 10/16/19  Yes Esterwood, Amy S, PA-C  folic acid (FOLVITE) 1 MG tablet Take 1 mg by mouth daily.  Yes [provider]  lisinopril (ZESTRIL) 40 MG tablet Take 1 tablet (40 mg total) by mouth daily. 09/04/19  Yes Sueanne Margarita, MD  spironolactone (ALDACTONE) 50 MG tablet Take 1 tablet by mouth once daily 01/11/19  Yes Burtis Junes, NP  Vitamin D, Ergocalciferol, (DRISDOL) 1.25 MG (50000 UNIT) CAPS capsule Take 50,000 Units by mouth once a week. 03/28/20  Yes [provider]  vitamin E 400 UNIT capsule Take 400 Units by mouth daily.   Yes [provider]    Allergies    Hydralazine and Motrin [ibuprofen]  Review of Systems   Review of Systems  Constitutional: Negative for fever.  HENT: Negative for congestion.   Eyes: Negative for visual disturbance.  Respiratory: Positive for shortness of breath. Cough: occasional chronic not new or worsening.   Cardiovascular: Negative for chest pain and leg swelling.  Gastrointestinal: Negative for abdominal pain, nausea and vomiting.  Genitourinary: Negative for dysuria.  Musculoskeletal: Negative for back pain.  Skin: Negative for rash.  Neurological: Negative for light-headedness and headaches.    Physical Exam Updated Vital Signs BP 135/63 (BP Location: Right  Arm)   Pulse (!) 58   Temp 97.9 F (36.6 C) (Oral)   Resp 18   Ht 5\' 3"  (1.6 m)   Wt 91.2 kg   SpO2 98%   BMI 35.62 kg/m   Physical Exam Vitals and nursing Wheeler reviewed.  Constitutional:      General: She is not in acute distress.    Appearance: She is well-developed. She is not diaphoretic.  HENT:     Head: Normocephalic and atraumatic.  Eyes:     Conjunctiva/sclera: Conjunctivae normal.  Cardiovascular:     Rate and Rhythm: Normal rate and regular rhythm.     Heart sounds: Normal heart sounds. No murmur heard. No friction rub. No gallop.   Pulmonary:     Effort: Pulmonary effort is normal. No respiratory distress.     Breath sounds: Examination of the left-lower field reveals decreased breath sounds. Decreased breath sounds present. No wheezing or rales.  Abdominal:     General: There is no distension.     Palpations: Abdomen is soft.     Tenderness: There is no abdominal tenderness. There is no guarding.  Musculoskeletal:        General: No tenderness.     Cervical back: Normal range of motion.  Skin:    General: Skin is warm and dry.     Findings: No erythema or rash.  Neurological:     Mental Status: She is alert and oriented to person, place, and time.     ED Results / Procedures / Treatments   Labs (all labs ordered are listed, but only abnormal results are displayed) Labs Reviewed  CBC WITH DIFFERENTIAL/PLATELET - Abnormal; Notable for the following components:      Result Value   RBC 5.45 (*)    MCV 74.5 (*)    MCH 22.9 (*)    All other components within normal limits  COMPREHENSIVE METABOLIC PANEL - Abnormal; Notable for the following components:   Glucose, Bld 138 (*)    Creatinine, Ser 1.34 (*)    Total Protein 6.4 (*)    GFR, Estimated 44 (*)    All other components within normal limits  HEPARIN LEVEL (UNFRACTIONATED) - Abnormal; Notable for the following components:   Heparin Unfractionated 1.10 (*)    All other components within normal  limits  HEPARIN LEVEL (UNFRACTIONATED) - Abnormal;  Notable for the following components:   Heparin Unfractionated 0.93 (*)    All other components within normal limits  TROPONIN I (HIGH SENSITIVITY) - Abnormal; Notable for the following components:   Troponin I (High Sensitivity) 54 (*)    All other components within normal limits  TROPONIN I (HIGH SENSITIVITY) - Abnormal; Notable for the following components:   Troponin I (High Sensitivity) 54 (*)    All other components within normal limits  RESP PANEL BY RT-PCR (FLU A&B, COVID) ARPGX2  HIV ANTIBODY (ROUTINE TESTING W REFLEX)  BASIC METABOLIC PANEL  HEPARIN LEVEL (UNFRACTIONATED)    EKG EKG Interpretation  Date/Time:  Monday April 01 2020 08:14:01 EDT Ventricular Rate:  63 PR Interval:    QRS Duration: 100 QT Interval:  530 QTC Calculation: 543 R Axis:   40 Text Interpretation: Sinus rhythm Low voltage, precordial leads RSR' in V1 or V2, probably normal variant Abnormal T, consider ischemia, anterior leads Prolonged QT interval No significant change since last tracing Confirmed by Gareth Morgan [phone]) on 04/01/2020 10:12:59 PM   Radiology CT Angio Chest PE W and/or Wo Contrast  Result Date: 04/01/2020 CLINICAL DATA:  PE suspected, shortness of breath, history of aortic dissection EXAM: CT ANGIOGRAPHY CHEST WITH CONTRAST TECHNIQUE: Multidetector CT imaging of the chest was performed using the standard protocol during bolus administration of intravenous contrast. Multiplanar CT image reconstructions and MIPs were obtained to evaluate the vascular anatomy. CONTRAST:  162mL OMNIPAQUE IOHEXOL 350 MG/ML SOLN COMPARISON:  CT chest thoracic angiogram, 11/27/2019 FINDINGS: Cardiovascular: Satisfactory opacification of the pulmonary arteries to the segmental level. Positive examination for pulmonary embolism, with saddle embolus present as well as a large burden of lobar and more distal embolus throughout, greater volume and more occlusive  in the right pulmonary arteries. Mild, global cardiomegaly with enlargement of the RV LV ratio, approximately 1.3-1. Main pulmonary artery is normal in caliber. 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 No pericardial effusion. Mediastinum/Nodes: No enlarged mediastinal, hilar, or axillary lymph nodes. Thyroid gland, trachea, and esophagus demonstrate no significant findings. Lungs/Pleura: Lungs are clear. No pleural effusion or pneumothorax. Upper Abdomen: No acute abnormality. Musculoskeletal: No chest wall abnormality. No acute or significant osseous findings. Review of the MIP images confirms the above findings. IMPRESSION: 1. Positive examination for pulmonary embolism, with saddle embolus present as well as a large burden of lobar and more distal embolus throughout, greater volume and more occlusive in the right pulmonary arteries. 2. Mild, global cardiomegaly with enlargement of the RV LV ratio, approximately 1.3-1, concerning for right heart strain. 3. 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 Aortic Atherosclerosis (ICD10-I70.0). These results were called by telephone at the time of interpretation on 04/01/2020 at 10:38 am to Dr. Gareth Morgan , who verbally acknowledged these results. Electronically Signed   By: Eddie Candle M.D.   On: 04/01/2020 10:41   DG Chest Portable 1 View  Result Date: 04/01/2020 CLINICAL DATA:  Shortness of breath EXAM: PORTABLE CHEST 1 VIEW COMPARISON:  February 01, 2018 chest radiograph;; chest CT November 27, 2019 FINDINGS: Lungs are clear. Heart is upper normal in size with pulmonary vascularity within normal limits. Patient is status post median  sternotomy. There is aortic atherosclerosis. No bone lesions. IMPRESSION: Lungs clear. Stable cardiac  silhouette. Postoperative change. Aortic Atherosclerosis (ICD10-I70.0). Electronically Signed   By: Lowella Grip III M.D.   On: 04/01/2020 08:27   ECHOCARDIOGRAM COMPLETE  Result Date: 04/01/2020    ECHOCARDIOGRAM REPORT   Patient Name:   MIRTIE BASTYR Date of Exam: 04/01/2020 Medical Rec #:  [id number]           Height:       63.0 in Accession #:    [id number]          Weight:       205.0 lb Date of Birth:  1955-05-27            BSA:          1.954 m Patient Age:    42 years            BP:           126/64 mmHg Patient Gender: F                   HR:           56 bpm. Exam Location:  Inpatient Procedure: 2D Echo STAT ECHO                            MODIFIED REPORT: This report was modified by Dorris Carnes MD on 04/01/2020 due to complete.  Indications:     pulmonary emboli  History:         Patient has prior history of Echocardiogram examinations, most                  recent 04/07/2019. Aortic dissection repair, CAD; Risk                  Factors:Dyslipidemia, Hypertension and Sleep Apnea.  Sonographer:     Johny Chess Referring Phys:  [id number] RAHUL P DESAI Diagnosing Phys: Dorris Carnes MD IMPRESSIONS  1. Left ventricular ejection fraction, by estimation, is 50 to 55%. The left ventricle has low normal function. The left ventricle has no regional wall motion abnormalities. Left ventricular diastolic function could not be evaluated.  2. Compared to echo from March 2021, RV dysfunction is new.. Right ventricular systolic function is severely reduced. The right ventricular size is mildly enlarged.  3. The mitral valve is normal in structure. Trivial mitral valve regurgitation.  4. The aortic valve is tricuspid. Aortic valve regurgitation is moderate. Mild aortic valve sclerosis is present, with no evidence of aortic valve stenosis.  5. Aortic dilatation noted. There is mild dilatation of the aortic root,  measuring 38 mm.  6. The inferior vena cava is normal in size with greater than 50% respiratory variability, suggesting right atrial pressure of 3 mmHg. FINDINGS  Left Ventricle: Left ventricular ejection fraction, by estimation, is 50 to 55%. The left ventricle has low normal function. The left ventricle has no regional wall motion abnormalities. The left ventricular internal cavity size was normal in size. There is no left ventricular hypertrophy. Left ventricular diastolic function could not be evaluated. Right Ventricle: Compared to echo from March 2021, RV dysfunction is new. The right ventricular size is mildly enlarged. Right vetricular wall thickness was not assessed. Right ventricular systolic function is severely reduced. Left Atrium: Left atrial size was normal in size. Right Atrium: Right atrial size was normal in size. Pericardium: There is no evidence of pericardial effusion. Mitral Valve: The mitral valve is normal in structure. Trivial mitral valve regurgitation. Tricuspid Valve:  The tricuspid valve is normal in structure. Tricuspid valve regurgitation is trivial. Aortic Valve: The aortic valve is tricuspid. Aortic valve regurgitation is moderate. Mild aortic valve sclerosis is present, with no evidence of aortic valve stenosis. Pulmonic Valve: The pulmonic valve was normal in structure. Pulmonic valve regurgitation is trivial. Aorta: Aortic dilatation noted. There is mild dilatation of the aortic root, measuring 38 mm. Venous: The inferior vena cava is normal in size with greater than 50% respiratory variability, suggesting right atrial pressure of 3 mmHg. IAS/Shunts: No atrial level shunt detected by color flow Doppler.  LEFT VENTRICLE PLAX 2D LVIDd:         3.70 cm  Diastology LVIDs:         2.70 cm  LV e' medial:  5.77 cm/s LV PW:         1.10 cm  LV e' lateral: 5.87 cm/s LV IVS:        1.00 cm LVOT diam:     1.90 cm LV SV:         49 LV SV Index:   25 LVOT Area:     2.84 cm  RIGHT VENTRICLE             IVC RV S prime:     9.14 cm/s  IVC diam: 1.50 cm TAPSE (M-mode): 1.3 cm LEFT ATRIUM             Index       RIGHT ATRIUM           Index LA diam:        3.70 cm 1.89 cm/m  RA Area:     11.10 cm LA Vol (A2C):   34.5 ml 17.66 ml/m RA Volume:   20.50 ml  10.49 ml/m LA Vol (A4C):   35.0 ml 17.91 ml/m LA Biplane Vol: 35.2 ml 18.01 ml/m  AORTIC VALVE LVOT Vmax:   83.80 cm/s LVOT Vmean:  47.400 cm/s LVOT VTI:    0.174 m  AORTA Ao Root diam: 3.80 cm Ao Asc diam:  3.30 cm  SHUNTS Systemic VTI:  0.17 m Systemic Diam: 1.90 cm Dorris Carnes MD Electronically signed by Dorris Carnes MD Signature Date/Time: 04/01/2020/1:57:10 PM    Final (Updated)    VAS [place] LOWER EXTREMITY VENOUS (DVT)  Result Date: 04/01/2020  Lower Venous DVT Study Indications: Saddle pulmonary embolism.  Risk Factors: DVT Prior right peroneal DVT noted on study done 03/15/18. Comparison Study: Prior RLE venous study done 11/09/18 showed chronic right                   peroneal vein DVT Performing Technologist: Sharion Dove RVS  Examination Guidelines: A complete evaluation includes B-mode imaging, spectral Doppler, color Doppler, and power Doppler as needed of all accessible portions of each vessel. Bilateral testing is considered an integral part of a complete examination. Limited examinations for reoccurring indications may be performed as noted. The reflux portion of the exam is performed with the patient in reverse Trendelenburg.  +---------+---------------+---------+-----------+----------+--------------+ RIGHT    CompressibilityPhasicitySpontaneityPropertiesThrombus Aging +---------+---------------+---------+-----------+----------+--------------+ CFV      Full                                                        +---------+---------------+---------+-----------+----------+--------------+ SFJ      Full                                                         +---------+---------------+---------+-----------+----------+--------------+  FV Prox  Full                                                        +---------+---------------+---------+-----------+----------+--------------+ FV Mid   Full                                                        +---------+---------------+---------+-----------+----------+--------------+ FV DistalFull                                                        +---------+---------------+---------+-----------+----------+--------------+ PFV      Full                                                        +---------+---------------+---------+-----------+----------+--------------+ POP      Full           Yes      Yes                                 +---------+---------------+---------+-----------+----------+--------------+ PTV      Full                                                        +---------+---------------+---------+-----------+----------+--------------+ PERO     None                                         Acute          +---------+---------------+---------+-----------+----------+--------------+ Gastroc  None                                         Acute          +---------+---------------+---------+-----------+----------+--------------+   +---------+---------------+---------+-----------+----------+--------------+ LEFT     CompressibilityPhasicitySpontaneityPropertiesThrombus Aging +---------+---------------+---------+-----------+----------+--------------+ CFV      Full           Yes      Yes                                 +---------+---------------+---------+-----------+----------+--------------+ SFJ      Full                                                        +---------+---------------+---------+-----------+----------+--------------+  FV Prox  Full                                                         +---------+---------------+---------+-----------+----------+--------------+ FV Mid   Full                                                        +---------+---------------+---------+-----------+----------+--------------+ FV DistalFull                                                        +---------+---------------+---------+-----------+----------+--------------+ PFV      Full                                                        +---------+---------------+---------+-----------+----------+--------------+ POP      Full           Yes      Yes                                 +---------+---------------+---------+-----------+----------+--------------+ PTV      Full                                                        +---------+---------------+---------+-----------+----------+--------------+ PERO     Full                                                        +---------+---------------+---------+-----------+----------+--------------+ Gastroc  Full                                                        +---------+---------------+---------+-----------+----------+--------------+     Summary: RIGHT: - Findings consistent with acute deep vein thrombosis involving the right gastrocnemius veins, and right peroneal veins. - Findings suggest new clot progression as compared to previous examination.  LEFT: - There is no evidence of deep vein thrombosis in the lower extremity.  *See table(s) above for measurements and observations.    Preliminary     Procedures .Critical Care Performed by: Gareth Morgan, MD Authorized by: Gareth Morgan, MD   Critical care provider statement:    Critical care time (minutes):  60   Critical care was time spent personally by me on the following activities:  Discussions with consultants, evaluation of patient's response to  treatment, examination of patient, ordering and performing treatments and interventions, ordering and review of  laboratory studies, ordering and review of radiographic studies, pulse oximetry, re-evaluation of patient's condition, obtaining history from patient or surrogate and review of old charts     Medications Ordered in ED Medications  heparin ADULT infusion 100 units/mL (25000 units/251mL) (1,050 Units/hr Intravenous Rate/Dose Change 04/01/20 2110)  aspirin EC tablet 81 mg (81 mg Oral Not Given 04/01/20 1252)  atorvastatin (LIPITOR) tablet 40 mg (40 mg Oral Given 04/01/20 1713)  colesevelam [facility]) tablet 625 mg (625 mg Oral Given 04/01/20 1713)  dicyclomine (BENTYL) tablet 20 mg (has no administration in time range)  folic acid (FOLVITE) tablet 1 mg (1 mg Oral Patient Refused/Not Given 04/01/20 1451)  acetaminophen (TYLENOL) tablet 650 mg (has no administration in time range)    Or  acetaminophen (TYLENOL) suppository 650 mg (has no administration in time range)  sodium chloride flush (NS) 0.9 % injection 3 mL (3 mLs Intravenous Not Given 04/01/20 2111)  aspirin chewable tablet 324 mg (324 mg Oral Given 04/01/20 0831)  iohexol (OMNIPAQUE) 350 MG/ML injection 100 mL (100 mLs Intravenous Contrast Given 04/01/20 1022)  heparin bolus via infusion 5,000 Units (5,000 Units Intravenous Bolus from Bag 04/01/20 1128)    ED Course  I have reviewed the triage vital signs and the nursing notes.  Pertinent labs & imaging results that were available during my care of the patient were reviewed by me and considered in my medical decision making (see chart for details).    MDM Rules/Calculators/A&P                          65 year old female with history of ascending aortic dissection with resuspension of her aortic valve and ascending aorta with resuspension of her aortic valve and ascending aortawith resuspension of her aortic valve and ascending aorta March 02, 2014, coronary artery disease, DVT, hypertension, hyperlipidemia, OSA, dilated cardiomyopathy who presents with concern for shortness of breath.     EKG significant for deeply inverted TW new in comparison to prior and mild inferior elevation which appears similar.  Discussed with Cardiology on call, will repeat ECG, continue work up and plan consult if no signs of PE on CT.   Not having chest pain or back pain, lower suspicion clinically for symptoms related to prior dissection.  CT PE study ordered given dyspnea, hx of DVT, ECG changes which may be ischemia or heart strain.   CT shows saddle pulmonary embolus as well as large burden of lobar embolus, signs of right heart strain.  Troponin and ECG changes secondary to strain from PE. She has no significant vital sign abnormalities at rest. Consulted pulmonary who evaluated patient, recommend stepdown admission to hospitalist with ECHO and notifying them if significant abnormalities on ECHO.     Final Clinical Impression(s) / ED Diagnoses Final diagnoses:  Acute saddle pulmonary embolism with acute cor pulmonale [facility])    Rx / DC Orders ED Discharge Orders    None       Gareth Morgan, MD 04/01/20 2219

## 2020-04-01 NOTE — Progress Notes (Signed)
ANTICOAGULATION CONSULT NOTE - Initial Consult  Pharmacy Consult for heparin Indication: pulmonary embolus  Allergies  Allergen Reactions  . Hydralazine Nausea Only  . Motrin [Ibuprofen] Nausea Only    Upset GI (motrin brand causes the side effect)    Patient Measurements:   Heparin Dosing Weight: 74kg  Vital Signs: Temp: 98.2 F (36.8 C) (03/21 0738) Temp Source: Oral (03/21 0738) BP: 122/70 (03/21 1039) Pulse Rate: 71 (03/21 1039)  Labs: Recent Labs    04/01/20 0800  HGB 12.5  HCT 40.6  PLT 164  CREATININE 1.34*  TROPONINIHS 54*    CrCl cannot be calculated (Unknown ideal weight.).   Medical History: Past Medical History:  Diagnosis Date  . Anemia    childhood  . Arthritis    right knee  . Ascending aortic dissection [facility])    s/p repair by Dr. Servando Snare  . Blood transfusion without reported diagnosis    with heart surgery-repair aorta  . CAD (coronary artery disease)    a. 04/2015: cath showed 30% Ost RCA stenosis, 20% mid-LAD stenosis, and normal LV function.   . Cataract    bilateral eyes, per pt.  . Congestive dilated cardiomyopathy ([place]) 08/01/2014  . DVT (deep venous thrombosis) ([place])    right  . GERD   . HEMATURIA UNSPECIFIED   . History of kidney stones   . HYPERLIPIDEMIA   . HYPERTENSION   . Morbid obesity ([place])   . Myocardial infarction ([place])    01/30/2014  . MYOSITIS   . OSA on CPAP 05/10/2017   mild OSA with an AHI of 5.2/hr overall but severe during REM sleep with an AHI of 41.4/hr and underwent CPAP titration to 12cm H2O  . Sleep apnea    cpap-  doesnt use it- last study years ago  . Vertigo    Assessment: 50 YOF presenting with SOB, CT shows saddle PE concerning for RHS.  Hx of aortic dissection in 2016.  Hx of DVT and not on anticoagulation at this time PTA, CBC wnl.    Goal of Therapy:  Heparin level 0.3-0.7 units/ml Monitor platelets by anticoagulation protocol: Yes   Plan:  Heparin 5000 units IV x 1, and gtt at 1200  units/hr F/u 6 hour heparin level  Bertis Ruddy, PharmD Clinical Pharmacist ED Pharmacist Phone # [phone] 04/01/2020 10:50 AM

## 2020-04-01 NOTE — Progress Notes (Signed)
Echo images personally reviewed, no signs of RV/RA/IVC thrombus.  RV dysfunction not unexpected.  Continue present management.  If worsening hemodynamics, hypoxemia, consider 50mg  TPA over 2 hours.  Erskine Emery MD PCCM

## 2020-04-01 NOTE — Plan of Care (Signed)
  Problem: Education: Goal: Knowledge of General Education information will improve Description: Including pain rating scale, medication(s)/side effects and non-pharmacologic comfort measures Outcome: Progressing   Problem: Clinical Measurements: Goal: Respiratory complications will improve Outcome: Progressing   Problem: Clinical Measurements: Goal: Cardiovascular complication will be avoided Outcome: Progressing   Problem: Pain Managment: Goal: General experience of comfort will improve Outcome: Progressing   

## 2020-04-01 NOTE — Consult Note (Signed)
NAME:  Alexis Wheeler, MRN:  [id number], DOB:  10-27-1955, LOS: 0 ADMISSION DATE:  04/01/2020, CONSULTATION DATE:  04/01/20 REFERRING MD:  Billy Fischer CHIEF COMPLAINT:  SOB   History of Present Illness:  Alexis Wheeler is a 65 y.o. female who was admitted 3/21 with saddle PE.  She began to have sudden onset dyspnea evening of 3/19 after doing some cleaning around the house.  CTA demonstrated saddle PE with RV/LV of 1.3.  She has prior hx of small RLE DVT in 2016 that she says did not require any treatment.  She also has hx of ascending aortic dissection s/p repair 2016 by Dr. Servando Snare.  She has routine follow up with him and this has been stable since then.  Per chart review, she had CTA chest Feb 2018 which showed possible small non-occlusive PE versus chronic scarring.  In ED, she remained hemodynamically stable on room air.  Denies dyspnea at rest but does endorse some with exertion.  Denies any chest pain, syncope, hemoptysis, LE edema or pain. No hx of prior malignancy, no recent travel or periods of prolonged immobilization.  Denies tobacco use. States at baseline she is fairly active and moves around throughout the day.  Due to large clot burden, PCCM asked to see in consultation.  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  Significant Hospital  Events: Including procedures, antibiotic start and stop dates in addition to other pertinent events   3/21 > admit.  Interim History / Subjective:  Comfortable on room air.  Anxious about PE findings.  Objective:  Blood pressure 124/67, pulse 67, temperature 98.2 F (36.8 C), temperature source Oral, resp. rate 19, SpO2 100 %.       No intake or output data in the 24 hours ending 04/01/20 1145 There were no vitals filed for this visit.  Examination: General: Adult female, resting in bed, in NAD. Neuro: A&O x 3, no deficits. HEENT: [place]/AT. Sclerae anicteric. EOMI. Cardiovascular: RRR, no M/R/G.  Lungs: Respirations even and unlabored.  CTA bilaterally, No W/R/R. Abdomen: BS x 4, soft, NT/ND.  Musculoskeletal: No gross deformities, no edema.  Skin: Intact, warm, no rashes.  Labs/imaging personally reviewed:  CTA chest 3/21 > saddle PE with RV/LV of 1.3. Echo 3/21 >  LE duplex 3/21 >   Micro Data: SARS CoV2 3/21 > neg. Flu 3/21 > neg.  Resolved Hospital Problem list:    Assessment & Plan:   Saddle PE - unprovoked.  Of note, does have hx of small DVT in 2016 as well as possible small PE in 2018 (read as small PE vs chronic scarring). - OK for SDU admission under TRH. - Continue heparin gtt. - Would transition to Warfarin indefinitely given prior hx as stated above. - Would not favor any form of lytics given her hx of aortic dissection (and at this point, she does not meet criteria for lytics). - F/u on echo, if significant RV failure then please call us back to consider thrombolectomy versus EKOS (though hesitant for  EKOS / lytics as above). - F/u LE duplex, if mobile clot then might be reasonable to consider IVC filter given hx.  Rest per primary team.   Labs   CBC: Recent Labs  Lab 04/01/20 0800  WBC 6.3  NEUTROABS 3.7  HGB 12.5  HCT 40.6  MCV 74.5*  PLT 229    Basic Metabolic Panel: Recent Labs  Lab 04/01/20 0800  NA 135  K 4.0  CL 107  CO2 23   GLUCOSE 138*  BUN 13  CREATININE 1.34*  CALCIUM 9.0   GFR: CrCl cannot be calculated (Unknown ideal weight.). Recent Labs  Lab 04/01/20 0800  WBC 6.3    Liver Function Tests: Recent Labs  Lab 04/01/20 0800  AST 18  ALT 21  ALKPHOS 69  BILITOT 0.4  PROT 6.4*  ALBUMIN 3.5   No results for input(s): LIPASE, AMYLASE in the last 168 hours. No results for input(s): AMMONIA in the last 168 hours.  ABG    Component Value Date/Time   PHART 7.388 03/04/2014 0653   PCO2ART 43.4 03/04/2014 0653   PO2ART 100.0 03/04/2014 0653   HCO3 26.1 (H) 03/04/2014 0653   TCO2 26 03/22/2014 1948   ACIDBASEDEF 1.0 03/03/2014 1109   O2SAT 98.0 03/04/2014 0653     Coagulation Profile: No results for input(s): INR, PROTIME in the last 168 hours.  Cardiac Enzymes: No results for input(s): CKTOTAL, CKMB, CKMBINDEX, TROPONINI in the last 168 hours.  HbA1C: Hgb A1c MFr Bld  Date/Time Value Ref Range Status  08/31/2016 04:09 PM 6.0 (H) 4.8 - 5.6 % Final    Comment:             Prediabetes: 5.7 - 6.4          Diabetes: >6.4          Glycemic control for adults with diabetes: <7.0   03/07/2014 04:20 AM 5.8 (H) 4.8 - 5.6 % Final    Comment:    (NOTE)         Pre-diabetes: 5.7 - 6.4         Diabetes: >6.4         Glycemic control for adults with diabetes: <7.0     CBG: No results for input(s): GLUCAP in the last 168 hours.  Review of Systems:   All negative; except for those that are bolded, which indicate positives.  Constitutional: weight loss, weight gain, night sweats, fevers, chills, fatigue, weakness.  HEENT: headaches, sore throat, sneezing, nasal congestion, post nasal drip, difficulty swallowing, tooth/dental problems, visual complaints, visual changes, ear aches. Neuro: difficulty with speech, weakness, numbness, ataxia. CV:  chest pain, orthopnea, PND, swelling in lower extremities, dizziness, palpitations, syncope.  Resp: cough, hemoptysis, dyspnea, wheezing. GI:  heartburn, indigestion, abdominal pain, nausea, vomiting, diarrhea, constipation, change in bowel habits, loss of appetite, hematemesis, melena, hematochezia.  GU: dysuria, change in color of urine, urgency or frequency, flank pain, hematuria. MSK: joint pain or swelling, decreased range of motion. Psych: change in mood or affect, depression, anxiety, suicidal ideations, homicidal ideations. Skin: rash, itching, bruising.   Past Medical History:  She,  has a past medical history of Anemia, Arthritis, Ascending aortic dissection ([place]), Blood transfusion without reported diagnosis, CAD (coronary artery disease), Cataract, Congestive dilated cardiomyopathy ([place]) (08/01/2014), DVT (deep venous thrombosis) ([place]), GERD, HEMATURIA UNSPECIFIED, History of kidney stones, HYPERLIPIDEMIA, HYPERTENSION, Morbid obesity ([place]), Myocardial infarction ([place]), MYOSITIS, OSA on CPAP (05/10/2017), Sleep apnea, and Vertigo.   Surgical History:   Past Surgical History:  Procedure Laterality Date  . ABDOMINAL HYSTERECTOMY  1983  . CARDIAC CATHETERIZATION N/A 04/30/2015   Procedure: Left Heart Cath and Coronary Angiography;  Surgeon: Peter M Martinique, MD;  Location: [place] [facility];  Service: Cardiovascular;  Laterality: N/A;  . CHOLECYSTECTOMY  2001  . CYSTOSCOPY W/ RETROGRADES Left 08/02/2015   Procedure: CYSTOSCOPY WITH LEFT RETROGRADE PYELOGRAM;  Surgeon: Festus Aloe, MD;  Location: [facility];  Service: Urology;  Laterality: Left;  . CYSTOSCOPY WITH URETEROSCOPY, STONE BASKETRY AND STENT PLACEMENT Left 08/02/2015   Procedure:  Mitzi Davenport ;  Surgeon: Festus Aloe, MD;  Location: [facility];  Service: Urology;  Laterality: Left;  . CYSTOSCOPY/URETEROSCOPY/HOLMIUM LASER/STENT PLACEMENT Left 08/02/2015   Procedure: CYSTOSCOPY/URETEROSCOPY/HOLMIUM LASER/STENT PLACEMENT-LEFT;  Surgeon: Festus Aloe, MD;  Location: [facility];  Service: Urology;  Laterality: Left;  . HOLMIUM LASER APPLICATION Left 04/30/6220    Procedure: HOLMIUM LASER APPLICATION;  Surgeon: Festus Aloe, MD;  Location: [facility];  Service: Urology;  Laterality: Left;  . REPLACEMENT ASCENDING AORTA N/A 03/03/2014   Procedure: REPAIR OF ASCENDING AORTIC DISSECTION;  Surgeon: Grace Isaac, MD;  Location: [place];  Service: Open Heart Surgery;  Laterality: N/A;     Social History:   reports that she has never smoked. She has never used smokeless tobacco. She reports that she does not drink alcohol and does not use drugs.   Family History:  Her family history includes Aortic dissection (age of onset: 53) in her mother; Breast cancer in her sister; Breast cancer (age of onset: 80) in her mother; Colon cancer in her brother; Esophageal cancer in her brother; Hypertension in her daughter, father, mother, and sister; Throat cancer in her brother. There is no history of Seizures, Liver cancer, Pancreatic cancer, Rectal cancer, or Stomach cancer.   Allergies Allergies  Allergen Reactions  . Hydralazine Nausea Only  . Motrin [Ibuprofen] Nausea Only    Upset GI (motrin brand causes the side effect)     Home Medications  Prior to Admission medications   Medication Sig Start Date End Date Taking? Authorizing Provider  amLODipine (NORVASC) 10 MG tablet Take 1 tablet (10 mg total) by mouth daily. 09/04/19  Yes Sueanne Margarita, MD  aspirin EC 81 MG EC tablet Take 1 tablet (81 mg total) by mouth daily. 03/14/14  Yes Collins, Gina L, PA-C  atorvastatin (LIPITOR) 40 MG tablet TAKE 1 TABLET BY MOUTH ONCE DAILY AT 6:00 IN THE  EVENING Patient taking differently: Take 40 mg by mouth every evening. 11/06/19  Yes Sueanne Margarita, MD  carvedilol (COREG) 12.5 MG tablet Take 1 tablet by mouth twice daily 01/23/19  Yes Burtis Junes, NP  colesevelam [facility]) 625 MG tablet Take one tablet before lunch and bedtime.  Avoid taking within 2-3 hours of other medications to prevent interaction 06/05/19  Yes Nandigam, Venia Minks, MD  diclofenac Sodium (VOLTAREN) 1  % GEL Apply 2-4 g topically 4 (four) times daily as needed (pain). 12/26/18  Yes [provider]  dicyclomine (BENTYL) 20 MG tablet Take 1 tablet (20 mg total) by mouth 3 (three) times daily as needed for spasms. 10/16/19  Yes Esterwood, Amy S, PA-C  folic acid (FOLVITE) 1 MG tablet Take 1 mg by mouth daily.   Yes [provider]  lisinopril (ZESTRIL) 40 MG tablet Take 1 tablet (40 mg total) by mouth daily. 09/04/19  Yes Sueanne Margarita, MD  spironolactone (ALDACTONE) 50 MG tablet Take 1 tablet by mouth once daily 01/11/19  Yes Gerhardt,  Marlane Hatcher, NP  Vitamin D, Ergocalciferol, (DRISDOL) 1.25 MG (50000 UNIT) CAPS capsule Take 50,000 Units by mouth once a week. 03/28/20  Yes [provider]  vitamin E 400 UNIT capsule Take 400 Units by mouth daily.   Yes [provider]     Critical care time: 35 min.   Montey Hora, [facility] For pager details, please see AMION If no response to pager, please call [phone] until 7:00 PM After 7:00 PM, please call Elink at [phone] 04/01/2020, 11:45 AM

## 2020-04-01 NOTE — ED Triage Notes (Signed)
Pt reports shortness of breath that began yesterday after doing a lot of cleaning, shortness of breath has persisted into today and worse with any exertion. denies any CP.

## 2020-04-01 NOTE — ED Notes (Signed)
Report given, will call once room is ready.

## 2020-04-01 NOTE — Progress Notes (Signed)
ANTICOAGULATION CONSULT NOTE - Follow Up Consult  Pharmacy Consult for IV Heparin Indication: pulmonary embolus  Allergies  Allergen Reactions  . Hydralazine Nausea Only  . Motrin [Ibuprofen] Nausea Only    Upset GI (motrin brand causes the side effect)    Patient Measurements: Height: 5\' 3"  (160 cm) Weight: 91.2 kg (201 lb 1 oz) IBW/kg (Calculated) : 52.4 Heparin Dosing Weight: 74 kg  Vital Signs: Temp: 97.9 F (36.6 C) (03/21 1436) Temp Source: Oral (03/21 1436) BP: 135/63 (03/21 1436) Pulse Rate: 58 (03/21 1436)  Labs: Recent Labs    04/01/20 0800 04/01/20 1041 04/01/20 1700  HGB 12.5  --   --   HCT 40.6  --   --   PLT 164  --   --   HEPARINUNFRC  --   --  1.10*  CREATININE 1.34*  --   --   TROPONINIHS 54* 54*  --     Estimated Creatinine Clearance: 44.9 mL/min (A) (by C-G formula based on SCr of 1.34 mg/dL (H)).   Medical History: Past Medical History:  Diagnosis Date  . Anemia    childhood  . Arthritis    right knee  . Ascending aortic dissection [facility])    s/p repair by Dr. Servando Snare  . Blood transfusion without reported diagnosis    with heart surgery-repair aorta  . CAD (coronary artery disease)    a. 04/2015: cath showed 30% Ost RCA stenosis, 20% mid-LAD stenosis, and normal LV function.   . Cataract    bilateral eyes, per pt.  . Congestive dilated cardiomyopathy ([place]) 08/01/2014  . DVT (deep venous thrombosis) ([place])    right  . GERD   . HEMATURIA UNSPECIFIED   . History of kidney stones   . HYPERLIPIDEMIA   . HYPERTENSION   . Morbid obesity ([place])   . Myocardial infarction ([place])    01/30/2014  . MYOSITIS   . OSA on CPAP 05/10/2017   mild OSA with an AHI of 5.2/hr overall but severe during REM sleep with an AHI of 41.4/hr and underwent CPAP titration to 12cm H2O  . Sleep apnea    cpap-  doesnt use it- last study years ago  . Vertigo    Assessment: 65 yr old female presented with SOB: CT shows acute saddle PE concerning for RHS.  Doppler:  findings consistent with acute DVT in RLE (findings suggest new clot progression as compared to previous exam). Pt has hx of aortic dissection in 2016 and hx of DVT. She was no on anticoagulation PTA.   Initial heparin level ~5.5 hrs after heparin 5000 units IV bolus X 1, followed by heparin infusion at 1200 units/hr was 1.10 units/ml, which is above the goal range for this pt. H/H 12.5/40.6, plt 164. Per RN, no issues with IV or bleeding observed. Of note, initial heparin level was drawn only ~5.5 hrs after pt rec'd heparin bolus, so this may be contributing to high level. Ordered STAT repeat heparin level and continued heparin infusion at same rate, given PE with concern for RHS and no bleeding; repeat heparin level resulted at 0.93 units/ml, which is still above the goal range for this pt.  Goal of Therapy:  Heparin level 0.3-0.7 units/ml Monitor platelets by anticoagulation protocol: Yes   Plan:  Decrease heparin infusion to 1050 units/hr Check heparin level in 6 hrs Monitor daily heparin level, CBC Monitor for bleeding  Gillermina Hu, PharmD, BCPS, [facility] Clinical Pharmacist 04/01/2020 7:28 PM

## 2020-04-01 NOTE — H&P (Addendum)
History and Physical        Hospital Admission Note Date: 04/01/2020  Patient name: Alexis Wheeler Medical record number: [id number] Date of birth: [birth year] Age: 65 y.o. Gender: female  PCP: Nolene Ebbs, MD  Patient coming from: home Lives with: alone At baseline, ambulates: cane/independent  Chief Complaint    Chief Complaint  Patient presents with  . Shortness of Breath      HPI:   This is a 65 year old female with past medical history of ascending aortic dissection s/p repair with replacement of ascending aorta and resuspension of aortic valve (03/02/2014), CAD, hypertension, hyperlipidemia, morbid obesity, OSA on CPAP, RLE DVT which was not treated, possible PE versus scarring on CTA in 2018, dilated cardiomyopathy who presented to the ED with shortness of breath since Saturday evening. Worse on exertion which has worsened and now short of breath with just attempting to walk to the bathroom.  Denies any recent travel and no known history of malignancy.  She does however state that she has been much more sedentary recently and will go to the [place] and sit for hours.  Admits to only productive cough and wheezes on Saturday.  No chest pain, back pain, neck pain, jaw pain or arm pain.  Denies syncope, hemoptysis, lower extremity edema or pain.   ED Course: Afebrile, hemodynamically stable, on room air. Notable Labs: Glucose 138, BUN 13, creatinine 1.34, troponin 54->54, WBC 6.3, Hb 12.5, platelets 164, COVID-19 and flu negative otherwise labs unremarkable.  EKG: Normal axis, sinus rhythm, T wave inversions lead V1 -V3 and prolonged QT (512 ms).  ED provider discussed with cardiology who recommended repeat EKG (which was similar to prior) and continue work-up.  Notable Imaging: CTA chest-saddle PE and large burden of lobar more distal embolus throughout with greater volume and  more occlusive in the right pulmonary arteries; mild global cardiomegaly with enlargement of RV LV ratio concerning for right heart strain; aortic dissection repair stable.  PCCM was consulted and cardiology recommended continued work-up/treatment for PE and did not feel was necessary for formal cardiology consult.  Patient received aspirin 324 mg, heparin bolus and drip.    Vitals:   04/01/20 1130 04/01/20 1200  BP: (!) 142/68 126/64  Pulse: 63 70  Resp: 19 20  Temp:    SpO2: 97% 98%     Review of Systems:  Review of Systems  All other systems reviewed and are negative.   Medical/Social/Family History   Past Medical History: Past Medical History:  Diagnosis Date  . Anemia    childhood  . Arthritis    right knee  . Ascending aortic dissection [facility])    s/p repair by Dr. Servando Snare  . Blood transfusion without reported diagnosis    with heart surgery-repair aorta  . CAD (coronary artery disease)    a. 04/2015: cath showed 30% Ost RCA stenosis, 20% mid-LAD stenosis, and normal LV function.   . Cataract    bilateral eyes, per pt.  . Congestive dilated cardiomyopathy ([place]) 08/01/2014  . DVT (deep venous thrombosis) ([place])    right  . GERD   . HEMATURIA UNSPECIFIED   . History of kidney stones   . HYPERLIPIDEMIA   . HYPERTENSION   .  Morbid obesity ([place])   . Myocardial infarction ([place])    01/30/2014  . MYOSITIS   . OSA on CPAP 05/10/2017   mild OSA with an AHI of 5.2/hr overall but severe during REM sleep with an AHI of 41.4/hr and underwent CPAP titration to 12cm H2O  . Sleep apnea    cpap-  doesnt use it- last study years ago  . Vertigo     Past Surgical History:  Procedure Laterality Date  . ABDOMINAL HYSTERECTOMY  1983  . CARDIAC CATHETERIZATION N/A 04/30/2015   Procedure: Left Heart Cath and Coronary Angiography;  Surgeon: Peter M Martinique, MD;  Location: [facility];  Service: Cardiovascular;  Laterality: N/A;  . CHOLECYSTECTOMY  2001  . CYSTOSCOPY W/  RETROGRADES Left 08/02/2015   Procedure: CYSTOSCOPY WITH LEFT RETROGRADE PYELOGRAM;  Surgeon: Festus Aloe, MD;  Location: [facility];  Service: Urology;  Laterality: Left;  . CYSTOSCOPY WITH URETEROSCOPY, STONE BASKETRY AND STENT PLACEMENT Left 08/02/2015   Procedure:  Mitzi Davenport ;  Surgeon: Festus Aloe, MD;  Location: [facility];  Service: Urology;  Laterality: Left;  . CYSTOSCOPY/URETEROSCOPY/HOLMIUM LASER/STENT PLACEMENT Left 08/02/2015   Procedure: CYSTOSCOPY/URETEROSCOPY/HOLMIUM LASER/STENT PLACEMENT-LEFT;  Surgeon: Festus Aloe, MD;  Location: [facility];  Service: Urology;  Laterality: Left;  . HOLMIUM LASER APPLICATION Left 3/66/4403   Procedure: HOLMIUM LASER APPLICATION;  Surgeon: Festus Aloe, MD;  Location: [facility];  Service: Urology;  Laterality: Left;  . REPLACEMENT ASCENDING AORTA N/A 03/03/2014   Procedure: REPAIR OF ASCENDING AORTIC DISSECTION;  Surgeon: Grace Isaac, MD;  Location: [facility];  Service: Open Heart Surgery;  Laterality: N/A;    Medications: Prior to Admission medications   Medication Sig Start Date End Date Taking? Authorizing Provider  amLODipine (NORVASC) 10 MG tablet Take 1 tablet (10 mg total) by mouth daily. 09/04/19  Yes Sueanne Margarita, MD  aspirin EC 81 MG EC tablet Take 1 tablet (81 mg total) by mouth daily. 03/14/14  Yes Collins, Gina L, PA-C  atorvastatin (LIPITOR) 40 MG tablet TAKE 1 TABLET BY MOUTH ONCE DAILY AT 6:00 IN THE  EVENING Patient taking differently: Take 40 mg by mouth every evening. 11/06/19  Yes Sueanne Margarita, MD  carvedilol (COREG) 12.5 MG tablet Take 1 tablet by mouth twice daily 01/23/19  Yes Burtis Junes, NP  colesevelam [employer]) 625 MG tablet Take one tablet before lunch and bedtime.  Avoid taking within 2-3 hours of other medications to prevent interaction 06/05/19  Yes Nandigam, Venia Minks, MD  diclofenac Sodium (VOLTAREN) 1 % GEL Apply 2-4 g topically 4 (four) times daily as needed (pain). 12/26/18  Yes [provider]  dicyclomine (BENTYL) 20 MG tablet Take 1 tablet (20 mg total) by mouth 3 (three) times daily as needed for spasms. 10/16/19  Yes Esterwood, Amy S, PA-C  folic acid (FOLVITE) 1 MG tablet Take 1 mg by mouth daily.   Yes [provider]  lisinopril (ZESTRIL) 40 MG tablet Take 1 tablet (40 mg total) by mouth daily. 09/04/19  Yes Sueanne Margarita, MD  spironolactone (ALDACTONE) 50 MG tablet Take 1 tablet by mouth once daily 01/11/19  Yes Burtis Junes, NP  Vitamin D, Ergocalciferol, (DRISDOL) 1.25 MG (50000 UNIT) CAPS capsule Take 50,000 Units by mouth once a week. 03/28/20  Yes [provider]  vitamin E 400 UNIT capsule Take 400 Units by mouth daily.   Yes [provider]    Allergies:   Allergies  Allergen Reactions  .  Hydralazine Nausea Only  . Motrin [Ibuprofen] Nausea Only    Upset GI (motrin brand causes the side effect)    Social History:  reports that she has never smoked. She has never used smokeless tobacco. She reports that she does not drink alcohol and does not use drugs.  Family History: Family History  Problem Relation Age of Onset  . Breast cancer Mother 79  . Aortic dissection Mother 42  . Hypertension Mother   . Throat cancer Brother        1 bro living  . Colon cancer Brother   . Esophageal cancer Brother   . Hypertension Father   . Breast cancer Sister   . Hypertension Sister   . Hypertension Daughter   . Seizures Neg Hx   . Liver cancer Neg Hx   . Pancreatic cancer Neg Hx   . Rectal cancer Neg Hx   . Stomach cancer Neg Hx      Objective   Physical Exam: Blood pressure 126/64, pulse 70, temperature 98.2 F (36.8 C), temperature source Oral, resp. rate 20, SpO2 98 %.  Physical Exam Vitals and nursing note reviewed.  Constitutional:      Appearance: Normal appearance.  HENT:     Head: Normocephalic and atraumatic.  Eyes:     Conjunctiva/sclera: Conjunctivae normal.  Cardiovascular:     Rate and Rhythm:  Normal rate and regular rhythm.  Pulmonary:     Effort: Pulmonary effort is normal.     Breath sounds: Normal breath sounds. No wheezing.  Abdominal:     General: Abdomen is flat.     Palpations: Abdomen is soft.  Musculoskeletal:        General: No swelling or tenderness.     Right lower leg: No tenderness. No edema.     Left lower leg: No tenderness. No edema.  Skin:    Coloration: Skin is not jaundiced or pale.  Neurological:     Mental Status: She is alert. Mental status is at baseline.  Psychiatric:        Mood and Affect: Mood normal.        Behavior: Behavior normal.     LABS on Admission: I have personally reviewed all the labs and imaging below    Basic Metabolic Panel: Recent Labs  Lab 04/01/20 0800  NA 135  K 4.0  CL 107  CO2 23  GLUCOSE 138*  BUN 13  CREATININE 1.34*  CALCIUM 9.0   Liver Function Tests: Recent Labs  Lab 04/01/20 0800  AST 18  ALT 21  ALKPHOS 69  BILITOT 0.4  PROT 6.4*  ALBUMIN 3.5   No results for input(s): LIPASE, AMYLASE in the last 168 hours. No results for input(s): AMMONIA in the last 168 hours. CBC: Recent Labs  Lab 04/01/20 0800  WBC 6.3  NEUTROABS 3.7  HGB 12.5  HCT 40.6  MCV 74.5*  PLT 164   Cardiac Enzymes: No results for input(s): CKTOTAL, CKMB, CKMBINDEX, TROPONINI in the last 168 hours. BNP: Invalid input(s): POCBNP CBG: No results for input(s): GLUCAP in the last 168 hours.  Radiological Exams on Admission:  CT Angio Chest PE W and/or Wo Contrast  Result Date: 04/01/2020 CLINICAL DATA:  PE suspected, shortness of breath, history of aortic dissection EXAM: CT ANGIOGRAPHY CHEST WITH CONTRAST TECHNIQUE: Multidetector CT imaging of the chest was performed using the standard protocol during bolus administration of intravenous contrast. Multiplanar CT image reconstructions and MIPs were obtained to evaluate the vascular anatomy. CONTRAST:  122mL OMNIPAQUE IOHEXOL 350 MG/ML SOLN COMPARISON:  CT chest thoracic  angiogram, 11/27/2019 FINDINGS: Cardiovascular: Satisfactory opacification of the pulmonary arteries to the segmental level. Positive examination for pulmonary embolism, with saddle embolus present as well as a large burden of lobar and more distal embolus throughout, greater volume and more occlusive in the right pulmonary arteries. Mild, global cardiomegaly with enlargement of the RV LV ratio, approximately 1.3-1. Main pulmonary artery is normal in caliber. 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 No pericardial effusion. Mediastinum/Nodes: No enlarged mediastinal, hilar, or axillary lymph nodes. Thyroid gland, trachea, and esophagus demonstrate no significant findings. Lungs/Pleura: Lungs are clear. No pleural effusion or pneumothorax. Upper Abdomen: No acute abnormality. Musculoskeletal: No chest wall abnormality. No acute or significant osseous findings. Review of the MIP images confirms the above findings. IMPRESSION: 1. Positive examination for pulmonary embolism, with saddle embolus present as well as a large burden of lobar and more distal embolus throughout, greater volume and more occlusive in the right pulmonary arteries. 2. Mild, global cardiomegaly with enlargement of the RV LV ratio, approximately 1.3-1, concerning for right heart strain. 3. 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 Aortic Atherosclerosis (ICD10-I70.0). These results were called by telephone at the time of interpretation on 04/01/2020 at 10:38 am to Dr. Gareth Morgan , who verbally acknowledged these results. Electronically Signed   By: Eddie Candle M.D.   On: 04/01/2020 10:41   DG Chest Portable 1  View  Result Date: 04/01/2020 CLINICAL DATA:  Shortness of breath EXAM: PORTABLE CHEST 1 VIEW COMPARISON:  February 01, 2018 chest radiograph;; chest CT November 27, 2019 FINDINGS: Lungs are clear. Heart is upper normal in size with pulmonary vascularity within normal limits. Patient is status post median sternotomy. There is aortic atherosclerosis. No bone lesions. IMPRESSION: Lungs clear. Stable cardiac silhouette. Postoperative change. Aortic Atherosclerosis (ICD10-I70.0). Electronically Signed   By: Lowella Grip III M.D.   On: 04/01/2020 08:27      EKG: Normal axis, sinus rhythm, T wave inversion in V1 through V3 and prolonged QTc   A & P   Principal Problem:   Acute pulmonary embolism (HCC) Active Problems:   Hyperlipidemia with target LDL less than 70   S/P aortic dissection repair   Dilated cardiomyopathy (HCC)   Coronary artery disease   OSA on CPAP   Benign essential HTN   Saddle embolus of pulmonary artery ([place])   1. Acute saddle PE with concern for right heart strain a. Afebrile and hemodynamically stable on room air b. Possibly provoked from [place] c. CTA concerning for heart strain d. Continue heparin drip e. Follow-up echo and lower extremity US f. PCCM consulted  2. Abnormal EKG  Elevated Troponin likely stress induced  a. Likely from #1 b. Continue telemetry  3. Hypertension a. Holding antihypertensives  4. Hyperlipidemia a. Continue home statin and colesevelam  5. CAD a. Continue aspirin and statin  6. Dilated cardiomyopathy a. Continue beta blocker  7. History of ascending aortic dissection s/p repair with replacement of ascending aorta and resuspension of aortic valve (03/02/2014) a. Follows outpatient  8. OSA on CPAP a. Continue CPAP  9. CKD 3a, stable   DVT prophylaxis: heparin drip   Code Status: DNR  Diet: heart healthy Family Communication: Admission, patients condition and plan of care including  tests being ordered have been discussed with the patient who indicates understanding and agrees with the plan and Code Status. Patient's daughter was updated  Disposition Plan: The appropriate patient status for this patient is INPATIENT. Inpatient status is judged to be reasonable and necessary in order to provide the required intensity of service to ensure the patient's safety. The patient's presenting symptoms, physical exam findings, and initial radiographic and laboratory data in the context of their chronic comorbidities is felt to place them at high risk for further clinical deterioration. Furthermore, it is not anticipated that the patient will be medically stable for discharge from the hospital within 2 midnights of admission. The following factors support the patient status of inpatient.   " The patient's presenting symptoms include shortness of breath on exertion. " The worrisome physical exam findings include unremarkable. " The initial radiographic and laboratory data are worrisome because of saddle PE with concern for right heart strain. " The chronic co-morbidities include aortic dissection s/p repair, hypertension, hyperlipidemia.   * I certify that at the point of admission it is my clinical judgment that the patient will require inpatient hospital care spanning beyond 2 midnights from the point of admission due to high intensity of service, high risk for further deterioration and high frequency of surveillance required.*  The medical decision making on this patient was of high complexity and the patient is at high risk for clinical deterioration, therefore this is a level 3  admission.  Consultants  . PCCM  Procedures  . None  Time Spent on Admission: 72 minutes    Harold Hedge, DO [facility]  04/01/2020, 12:59 PM

## 2020-04-01 NOTE — ED Notes (Signed)
ECHO at bedside,

## 2020-04-02 ENCOUNTER — Telehealth: Payer: Self-pay | Admitting: Critical Care Medicine

## 2020-04-02 DIAGNOSIS — I2602 Saddle embolus of pulmonary artery with acute cor pulmonale: Principal | ICD-10-CM

## 2020-04-02 DIAGNOSIS — J9601 Acute respiratory failure with hypoxia: Secondary | ICD-10-CM

## 2020-04-02 LAB — BASIC METABOLIC PANEL
Anion gap: 8 (ref 5–15)
BUN: 15 mg/dL (ref 8–23)
CO2: 24 mmol/L (ref 22–32)
Calcium: 9.1 mg/dL (ref 8.9–10.3)
Chloride: 106 mmol/L (ref 98–111)
Creatinine, Ser: 1.29 mg/dL — ABNORMAL HIGH (ref 0.44–1.00)
GFR, Estimated: 46 mL/min — ABNORMAL LOW (ref >60)
Glucose, Bld: 109 mg/dL — ABNORMAL HIGH (ref 70–99)
Potassium: 4.1 mmol/L (ref 3.5–5.1)
Sodium: 138 mmol/L (ref 135–145)

## 2020-04-02 LAB — TSH: TSH: 1.017 u[IU]/mL (ref 0.350–4.500)

## 2020-04-02 LAB — HEPARIN LEVEL (UNFRACTIONATED)
Heparin Unfractionated: 0.77 IU/mL — ABNORMAL HIGH (ref 0.30–0.70)
Heparin Unfractionated: 0.59 IU/mL (ref 0.30–0.70)

## 2020-04-02 LAB — VITAMIN B12: Vitamin B-12: 517 pg/mL (ref 180–914)

## 2020-04-02 LAB — IRON AND TIBC
Iron: 83 ug/dL (ref 28–170)
Saturation Ratios: 22 % (ref 10.4–31.8)
TIBC: 382 ug/dL (ref 250–450)
UIBC: 299 ug/dL

## 2020-04-02 LAB — FERRITIN: Ferritin: 122 ng/mL (ref 11–307)

## 2020-04-02 MED ORDER — DM-GUAIFENESIN ER 30-600 MG PO TB12: 1.0000 | ORAL_TABLET | Freq: Two times a day (BID) | ORAL | Status: DC | PRN

## 2020-04-02 MED ORDER — SENNOSIDES-DOCUSATE SODIUM 8.6-50 MG PO TABS: 1.0000 | ORAL_TABLET | Freq: Every evening | ORAL | Status: DC | PRN

## 2020-04-02 MED ORDER — HYDRALAZINE HCL 20 MG/ML IJ SOLN: 10.0000 mg | INTRAMUSCULAR | Status: DC | PRN

## 2020-04-02 MED ORDER — IPRATROPIUM-ALBUTEROL 0.5-2.5 (3) MG/3ML IN SOLN: 3.0000 mL | RESPIRATORY_TRACT | Status: DC | PRN

## 2020-04-02 MED ORDER — TRAZODONE HCL 50 MG PO TABS
25.0000 mg | ORAL_TABLET | Freq: Every evening | ORAL | Status: DC | PRN
  Administered 2020-04-02: 25 mg via ORAL
  Filled 2020-04-02: qty 1

## 2020-04-02 NOTE — Progress Notes (Signed)
ANTICOAGULATION CONSULT NOTE - Follow Up Consult  Pharmacy Consult for heparin Indication: pulmonary embolus  Labs: Recent Labs    04/01/20 0800 04/01/20 1041 04/01/20 1700 04/01/20 1950 04/02/20 0446  HGB 12.5  --   --   --   --   HCT 40.6  --   --   --   --   PLT 164  --   --   --   --   HEPARINUNFRC  --   --  1.10* 0.93* 0.77*  CREATININE 1.34*  --   --   --  1.29*  TROPONINIHS 54* 54*  --   --   --     Assessment: 65yo female remains supratherapeutic on heparin after rate change though closer to goal; no gtt issues or signs of bleeding per RN.  Goal of Therapy:  Heparin level 0.3-0.7 units/ml   Plan:  Will decrease heparin gtt by 1 unit/kg/hr to 950 units/hr and check level in 8 hours.    Wynona Neat, PharmD, BCPS  04/02/2020,5:57 AM

## 2020-04-02 NOTE — Progress Notes (Signed)
ANTICOAGULATION CONSULT NOTE - Follow Up Consult  Pharmacy Consult for heparin Indication: pulmonary embolus  Labs: Recent Labs    04/01/20 0800 04/01/20 1041 04/01/20 1700 04/01/20 1950 04/02/20 0446 04/02/20 1358  HGB 12.5  --   --   --   --   --   HCT 40.6  --   --   --   --   --   PLT 164  --   --   --   --   --   HEPARINUNFRC  --   --    < > 0.93* 0.77* 0.59  CREATININE 1.34*  --   --   --  1.29*  --   TROPONINIHS 54* 54*  --   --   --   --    < > = values in this interval not displayed.    Assessment: 65yo female now therapeutic on heparin No gtt issues or signs of bleeding per RN.  Goal of Therapy:  Heparin level 0.3-0.7 units/ml   Plan:  Continue heparin at 950 units / hr Follow up AM labs  Thank you Anette Guarneri, PharmD    04/02/2020,3:13 PM

## 2020-04-02 NOTE — Telephone Encounter (Signed)
Ms. Milford needs follow up in the office to establish care in about 1 month.   Julian Hy, DO 04/02/20 2:20 PM [facility]

## 2020-04-02 NOTE — Progress Notes (Signed)
PROGRESS NOTE    Alexis Wheeler  VVZ:[id number] DOB: 1955/02/13 DOA: 04/01/2020 PCP: Nolene Ebbs, MD   Brief Narrative:  65 year old with history of ascending aortic dissection status post repair with replacement of ascending aorta, resuspension of aortic valve in 2016, CAD, HTN, HLD, morbid obesity, right upper lower extremity DVT not treated, OSA on CPAP, dilated cardiomyopathy admitted for shortness of breath started 2 days prior to admission.  She was found to have saddle pulmonary embolism with large burden with cor pulmonale.  Started on heparin drip.  PCCM cardiology team were consulted upon admission.  Not a candidate for TPA.   Assessment & Plan:   Principal Problem:   Acute pulmonary embolism (HCC) Active Problems:   Hyperlipidemia with target LDL less than 70   S/P aortic dissection repair   Dilated cardiomyopathy (HCC)   Coronary artery disease   OSA on CPAP   Benign essential HTN   Saddle embolus of pulmonary artery (HCC)  Acute respiratory distress Saddle pulmonary embolism with cor pulmonale -Not a candidate for TPA at the moment per pulmonary.  Continue heparin drip for 48 hours then transition to NOAC.  Minimum 6 months.  Echocardiogram shows EF 50-55% with right ventricular dysfunction. -Lower extremity Dopplers-right lower extremity DVT -Supplemental oxygen as necessary.  Supportive care.  Elevated troponin, demand ischemia -Secondary to pulmonary embolism.  Continue to monitor.  Patient is already on heparin drip.  Microcytic anemia -We will check iron studies, TSH, B12 and folate  Essential hypertension -Antihypertensives on hold.  Slowly resume Norvasc thereafter Coreg as appropriate  Hyperlipidemia -Continue home meds  Coronary artery disease -On aspirin and statin  Dilated cardiomyopathy now with right ventricular dysfunction due to PE -Home medications on hold-Coreg, lisinopril, Aldactone.  Closely monitor.  Continue aspirin and statin for  now.  History of ascending aortic dissection status post repair with replacement of ascending aorta and resuspension of aortic valve 2016 -Stable.  Follow-up outpatient.  CKD stage IIIa -Stable creatinine.  Baseline creatinine 1.3  Obstructive sleep apnea -CPAP at night  DVT prophylaxis: Heparin drip Code Status: Full code.  Confirmed with patient Family Communication:  Called her daughter   Status is: Inpatient  Remains inpatient appropriate because:Hemodynamically unstable and IV treatments appropriate due to intensity of illness or inability to take PO   Dispo: The patient is from: Home              Anticipated d/c is to: Home              Patient currently is not medically stable to d/c.  Need heparin drip minimum for 24 hours, transition to p.o. anticoagulation tomorrow if stable.  Not safe for discharge at this time   Difficult to place patient No  Subjective: Doing ok, denies any sob and chest pain at this time.   Review of Systems Otherwise negative except as per HPI, including: General: Denies fever, chills, night sweats or unintended weight loss. Resp: Denies cough, wheezing, shortness of breath. Cardiac: Denies chest pain, palpitations, orthopnea, paroxysmal nocturnal dyspnea. GI: Denies abdominal pain, nausea, vomiting, diarrhea or constipation GU: Denies dysuria, frequency, hesitancy or incontinence MS: Denies muscle aches, joint pain or swelling Neuro: Denies headache, neurologic deficits (focal weakness, numbness, tingling), abnormal gait Psych: Denies anxiety, depression, SI/HI/AVH Skin: Denies new rashes or lesions ID: Denies sick contacts, exotic exposures, travel  Examination:  General exam: Appears calm and comfortable  Respiratory system: Clear to auscultation. Respiratory effort normal. Cardiovascular system: S1 & S2 heard,  RRR. No JVD, murmurs, rubs, gallops or clicks. No pedal edema. Gastrointestinal system: Abdomen is nondistended, soft and  nontender. No organomegaly or masses felt. Normal bowel sounds heard. Central nervous system: Alert and oriented. No focal neurological deficits. Extremities: Symmetric 5 x 5 power. Skin: No rashes, lesions or ulcers Psychiatry: Judgement and insight appear normal. Mood & affect appropriate.    Objective: Vitals:   04/01/20 1938 04/01/20 2300 04/02/20 0408 04/02/20 0808  BP:    130/74  Pulse:    66  Resp:    16  Temp: 97.9 F (36.6 C) 97.8 F (36.6 C) 97.9 F (36.6 C) 98.4 F (36.9 C)  TempSrc: Oral Oral Oral Oral  SpO2:    96%  Weight:      Height:        Intake/Output Summary (Last 24 hours) at 04/02/2020 0835 Last data filed at 04/01/2020 1600 Gross per 24 hour  Intake 99.41 ml  Output -  Net 99.41 ml   Filed Weights   04/01/20 1436  Weight: 91.2 kg     Data Reviewed:   CBC: Recent Labs  Lab 04/01/20 0800  WBC 6.3  NEUTROABS 3.7  HGB 12.5  HCT 40.6  MCV 74.5*  PLT 867   Basic Metabolic Panel: Recent Labs  Lab 04/01/20 0800 04/02/20 0446  NA 135 138  K 4.0 4.1  CL 107 106  CO2 23 24  GLUCOSE 138* 109*  BUN 13 15  CREATININE 1.34* 1.29*  CALCIUM 9.0 9.1   GFR: Estimated Creatinine Clearance: 46.6 mL/min (A) (by C-G formula based on SCr of 1.29 mg/dL (H)). Liver Function Tests: Recent Labs  Lab 04/01/20 0800  AST 18  ALT 21  ALKPHOS 69  BILITOT 0.4  PROT 6.4*  ALBUMIN 3.5   No results for input(s): LIPASE, AMYLASE in the last 168 hours. No results for input(s): AMMONIA in the last 168 hours. Coagulation Profile: No results for input(s): INR, PROTIME in the last 168 hours. Cardiac Enzymes: No results for input(s): CKTOTAL, CKMB, CKMBINDEX, TROPONINI in the last 168 hours. BNP (last 3 results) No results for input(s): PROBNP in the last 8760 hours. HbA1C: No results for input(s): HGBA1C in the last 72 hours. CBG: No results for input(s): GLUCAP in the last 168 hours. Lipid Profile: No results for input(s): CHOL, HDL, LDLCALC, TRIG,  CHOLHDL, LDLDIRECT in the last 72 hours. Thyroid Function Tests: No results for input(s): TSH, T4TOTAL, FREET4, T3FREE, THYROIDAB in the last 72 hours. Anemia Panel: No results for input(s): VITAMINB12, FOLATE, FERRITIN, TIBC, IRON, RETICCTPCT in the last 72 hours. Sepsis Labs: No results for input(s): PROCALCITON, LATICACIDVEN in the last 168 hours.  Recent Results (from the past 240 hour(s))  Resp Panel by RT-PCR (Flu A&B, Covid) Nasopharyngeal Swab     Status: None   Collection Time: 04/01/20  8:01 AM   Specimen: Nasopharyngeal Swab; Nasopharyngeal(NP) swabs in vial transport medium  Result Value Ref Range Status   SARS Coronavirus 2 by RT PCR NEGATIVE NEGATIVE Final    Comment: (NOTE) SARS-CoV-2 target nucleic acids are NOT DETECTED.  The SARS-CoV-2 RNA is generally detectable in upper respiratory specimens during the acute phase of infection. The lowest concentration of SARS-CoV-2 viral copies this assay can detect is 138 copies/mL. A negative result does not preclude SARS-Cov-2 infection and should not be used as the sole basis for treatment or other patient management decisions. A negative result may occur with  improper specimen collection/handling, submission of specimen other than nasopharyngeal swab,  presence of viral mutation(s) within the areas targeted by this assay, and inadequate number of viral copies(<138 copies/mL). A negative result must be combined with clinical observations, patient history, and epidemiological information. The expected result is Negative.  Fact Sheet for Patients:  EntrepreneurPulse.com.au  Fact Sheet for Healthcare Providers:  IncredibleEmployment.be  This test is no t yet approved or cleared by the [place] FDA and  has been authorized for detection and/or diagnosis of SARS-CoV-2 by FDA under an Emergency Use Authorization (EUA). This EUA will remain  in effect (meaning this test can be used) for  the duration of the COVID-19 declaration under Section 564(b)(1) of the Act, 21 U.S.C.section 360bbb-3(b)(1), unless the authorization is terminated  or revoked sooner.       Influenza A by PCR NEGATIVE NEGATIVE Final   Influenza B by PCR NEGATIVE NEGATIVE Final    Comment: (NOTE) The Xpert Xpress SARS-CoV-2/FLU/RSV plus assay is intended as an aid in the diagnosis of influenza from Nasopharyngeal swab specimens and should not be used as a sole basis for treatment. Nasal washings and aspirates are unacceptable for Xpert Xpress SARS-CoV-2/FLU/RSV testing.  Fact Sheet for Patients: EntrepreneurPulse.com.au  Fact Sheet for Healthcare Providers: IncredibleEmployment.be  This test is not yet approved or cleared by the [place] FDA and has been authorized for detection and/or diagnosis of SARS-CoV-2 by FDA under an Emergency Use Authorization (EUA). This EUA will remain in effect (meaning this test can be used) for the duration of the COVID-19 declaration under Section 564(b)(1) of the Act, 21 U.S.C. section 360bbb-3(b)(1), unless the authorization is terminated or revoked.  Performed at [facility], [address] [place], [place] [address]          Radiology Studies: CT Angio Chest PE W and/or Wo Contrast  Result Date: 04/01/2020 CLINICAL DATA:  PE suspected, shortness of breath, history of aortic dissection EXAM: CT ANGIOGRAPHY CHEST WITH CONTRAST TECHNIQUE: Multidetector CT imaging of the chest was performed using the standard protocol during bolus administration of intravenous contrast. Multiplanar CT image reconstructions and MIPs were obtained to evaluate the vascular anatomy. CONTRAST:  180mL OMNIPAQUE IOHEXOL 350 MG/ML SOLN COMPARISON:  CT chest thoracic angiogram, 11/27/2019 FINDINGS: Cardiovascular: Satisfactory opacification of the pulmonary arteries to the segmental level. Positive examination for pulmonary embolism,  with saddle embolus present as well as a large burden of lobar and more distal embolus throughout, greater volume and more occlusive in the right pulmonary arteries. Mild, global cardiomegaly with enlargement of the RV LV ratio, approximately 1.3-1. Main pulmonary artery is normal in caliber. 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 No pericardial effusion. Mediastinum/Nodes: No enlarged mediastinal, hilar, or axillary lymph nodes. Thyroid gland, trachea, and esophagus demonstrate no significant findings. Lungs/Pleura: Lungs are clear. No pleural effusion or pneumothorax. Upper Abdomen: No acute abnormality. Musculoskeletal: No chest wall abnormality. No acute or significant osseous findings. Review of the MIP images confirms the above findings. IMPRESSION: 1. Positive examination for pulmonary embolism, with saddle embolus present as well as a large burden of lobar and more distal embolus throughout, greater volume and more occlusive in the right pulmonary arteries. 2. Mild, global cardiomegaly with enlargement of the RV LV ratio, approximately 1.3-1, concerning for right heart strain. 3. 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 Aortic Atherosclerosis (ICD10-I70.0). These results were called by telephone at the time of interpretation on 04/01/2020 at 10:38 am to Dr. Gareth Morgan , who verbally acknowledged these results. Electronically Signed   By: Eddie Candle M.D.   On: 04/01/2020 10:41   DG Chest Portable 1 View  Result Date: 04/01/2020 CLINICAL DATA:  Shortness of breath EXAM: PORTABLE CHEST 1 VIEW COMPARISON:  February 01, 2018 chest radiograph;; chest CT November 27, 2019 FINDINGS:  Lungs are clear. Heart is upper normal in size with pulmonary vascularity within normal limits. Patient is status post median sternotomy. There is aortic atherosclerosis. No bone lesions. IMPRESSION: Lungs clear. Stable cardiac silhouette. Postoperative change. Aortic Atherosclerosis (ICD10-I70.0). Electronically Signed   By: Lowella Grip III M.D.   On: 04/01/2020 08:27   ECHOCARDIOGRAM COMPLETE  Result Date: 04/01/2020    ECHOCARDIOGRAM REPORT   Patient Name:   CHONTEL WARNING Date of Exam: 04/01/2020 Medical Rec #:  [id number]           Height:       63.0 in Accession #:    [id number]          Weight:       205.0 lb Date of Birth:  06-May-1955            BSA:          1.954 m Patient Age:    4 years            BP:           126/64 mmHg Patient Gender: F                   HR:           56 bpm. Exam Location:  Inpatient Procedure: 2D Echo STAT ECHO                            MODIFIED REPORT: This report was modified by Dorris Carnes MD on 04/01/2020 due to complete.  Indications:     pulmonary emboli  History:         Patient has prior history of Echocardiogram examinations, most                  recent 04/07/2019. Aortic dissection repair, CAD; Risk                  Factors:Dyslipidemia, Hypertension and Sleep Apnea.  Sonographer:     Johny Chess Referring Phys:  [id number] RAHUL P DESAI Diagnosing Phys: Dorris Carnes MD IMPRESSIONS  1. Left ventricular ejection fraction, by estimation, is 50 to 55%. The left ventricle has low normal function. The left ventricle has no regional wall motion abnormalities. Left ventricular diastolic function could not be evaluated.  2. Compared to echo from March 2021, RV dysfunction is new.. Right ventricular systolic function is severely reduced. The right ventricular size is mildly enlarged.  3. The mitral valve is normal in structure. Trivial mitral valve regurgitation.  4. The aortic valve is tricuspid. Aortic valve regurgitation is moderate. Mild aortic valve sclerosis is  present, with no evidence of aortic valve stenosis.  5. Aortic dilatation noted. There is mild dilatation of the aortic root, measuring 38 mm.  6. The inferior vena cava is normal in size with greater than 50% respiratory variability, suggesting right atrial pressure of 3 mmHg. FINDINGS  Left Ventricle: Left ventricular ejection fraction, by estimation, is 50  to 55%. The left ventricle has low normal function. The left ventricle has no regional wall motion abnormalities. The left ventricular internal cavity size was normal in size. There is no left ventricular hypertrophy. Left ventricular diastolic function could not be evaluated. Right Ventricle: Compared to echo from March 2021, RV dysfunction is new. The right ventricular size is mildly enlarged. Right vetricular wall thickness was not assessed. Right ventricular systolic function is severely reduced. Left Atrium: Left atrial size was normal in size. Right Atrium: Right atrial size was normal in size. Pericardium: There is no evidence of pericardial effusion. Mitral Valve: The mitral valve is normal in structure. Trivial mitral valve regurgitation. Tricuspid Valve: The tricuspid valve is normal in structure. Tricuspid valve regurgitation is trivial. Aortic Valve: The aortic valve is tricuspid. Aortic valve regurgitation is moderate. Mild aortic valve sclerosis is present, with no evidence of aortic valve stenosis. Pulmonic Valve: The pulmonic valve was normal in structure. Pulmonic valve regurgitation is trivial. Aorta: Aortic dilatation noted. There is mild dilatation of the aortic root, measuring 38 mm. Venous: The inferior vena cava is normal in size with greater than 50% respiratory variability, suggesting right atrial pressure of 3 mmHg. IAS/Shunts: No atrial level shunt detected by color flow Doppler.  LEFT VENTRICLE PLAX 2D LVIDd:         3.70 cm  Diastology LVIDs:         2.70 cm  LV e' medial:  5.77 cm/s LV PW:         1.10 cm  LV e' lateral: 5.87 cm/s  LV IVS:        1.00 cm LVOT diam:     1.90 cm LV SV:         49 LV SV Index:   25 LVOT Area:     2.84 cm  RIGHT VENTRICLE            IVC RV S prime:     9.14 cm/s  IVC diam: 1.50 cm TAPSE (M-mode): 1.3 cm LEFT ATRIUM             Index       RIGHT ATRIUM           Index LA diam:        3.70 cm 1.89 cm/m  RA Area:     11.10 cm LA Vol (A2C):   34.5 ml 17.66 ml/m RA Volume:   20.50 ml  10.49 ml/m LA Vol (A4C):   35.0 ml 17.91 ml/m LA Biplane Vol: 35.2 ml 18.01 ml/m  AORTIC VALVE LVOT Vmax:   83.80 cm/s LVOT Vmean:  47.400 cm/s LVOT VTI:    0.174 m  AORTA Ao Root diam: 3.80 cm Ao Asc diam:  3.30 cm  SHUNTS Systemic VTI:  0.17 m Systemic Diam: 1.90 cm Dorris Carnes MD Electronically signed by Dorris Carnes MD Signature Date/Time: 04/01/2020/1:57:10 PM    Final (Updated)    VAS [place] LOWER EXTREMITY VENOUS (DVT)  Result Date: 04/01/2020  Lower Venous DVT Study Indications: Saddle pulmonary embolism.  Risk Factors: DVT Prior right peroneal DVT noted on study done 03/15/18. Comparison Study: Prior RLE venous study done 11/09/18 showed chronic right                   peroneal vein DVT Performing Technologist: Sharion Dove RVS  Examination Guidelines: A complete evaluation includes B-mode imaging, spectral Doppler, color Doppler, and power Doppler as needed of all accessible portions of each vessel. Bilateral testing is considered  an integral part of a complete examination. Limited examinations for reoccurring indications may be performed as noted. The reflux portion of the exam is performed with the patient in reverse Trendelenburg.  +---------+---------------+---------+-----------+----------+--------------+ RIGHT    CompressibilityPhasicitySpontaneityPropertiesThrombus Aging +---------+---------------+---------+-----------+----------+--------------+ CFV      Full                                                        +---------+---------------+---------+-----------+----------+--------------+ SFJ      Full                                                         +---------+---------------+---------+-----------+----------+--------------+ FV Prox  Full                                                        +---------+---------------+---------+-----------+----------+--------------+ FV Mid   Full                                                        +---------+---------------+---------+-----------+----------+--------------+ FV DistalFull                                                        +---------+---------------+---------+-----------+----------+--------------+ PFV      Full                                                        +---------+---------------+---------+-----------+----------+--------------+ POP      Full           Yes      Yes                                 +---------+---------------+---------+-----------+----------+--------------+ PTV      Full                                                        +---------+---------------+---------+-----------+----------+--------------+ PERO     None                                         Acute          +---------+---------------+---------+-----------+----------+--------------+ Gastroc  None  Acute          +---------+---------------+---------+-----------+----------+--------------+   +---------+---------------+---------+-----------+----------+--------------+ LEFT     CompressibilityPhasicitySpontaneityPropertiesThrombus Aging +---------+---------------+---------+-----------+----------+--------------+ CFV      Full           Yes      Yes                                 +---------+---------------+---------+-----------+----------+--------------+ SFJ      Full                                                        +---------+---------------+---------+-----------+----------+--------------+ FV Prox  Full                                                         +---------+---------------+---------+-----------+----------+--------------+ FV Mid   Full                                                        +---------+---------------+---------+-----------+----------+--------------+ FV DistalFull                                                        +---------+---------------+---------+-----------+----------+--------------+ PFV      Full                                                        +---------+---------------+---------+-----------+----------+--------------+ POP      Full           Yes      Yes                                 +---------+---------------+---------+-----------+----------+--------------+ PTV      Full                                                        +---------+---------------+---------+-----------+----------+--------------+ PERO     Full                                                        +---------+---------------+---------+-----------+----------+--------------+ Gastroc  Full                                                        +---------+---------------+---------+-----------+----------+--------------+  Summary: RIGHT: - Findings consistent with acute deep vein thrombosis involving the right gastrocnemius veins, and right peroneal veins. - Findings suggest new clot progression as compared to previous examination.  LEFT: - There is no evidence of deep vein thrombosis in the lower extremity.  *See table(s) above for measurements and observations. Electronically signed by Servando Snare MD on 04/01/2020 at 10:17:40 PM.    Final         Scheduled Meds: . aspirin EC  81 mg Oral Daily  . atorvastatin  40 mg Oral QPM  . colesevelam  625 mg Oral BID WC  . folic acid  1 mg Oral Daily  . sodium chloride flush  3 mL Intravenous Q12H   Continuous Infusions: . heparin 950 Units/hr (04/02/20 0716)     LOS: 1 day   Time spent= 35 mins    Ankit Arsenio Loader, MD [facility]  If 7PM-7AM, please contact night-coverage  04/02/2020, 8:35 AM

## 2020-04-02 NOTE — Progress Notes (Signed)
NAME:  AERON LHEUREUX, MRN:  [id number], DOB:  [birth year], LOS: 1 ADMISSION DATE:  04/01/2020, CONSULTATION DATE:  04/01/20 REFERRING MD:  Billy Fischer CHIEF COMPLAINT:  SOB   History of Present Illness:  Alexis Wheeler is a 65 y.o. female who was admitted 3/21 with saddle PE.  She began to have sudden onset dyspnea evening of 3/19 after doing some cleaning around the house.  CTA demonstrated saddle PE with RV/LV of 1.3.  She has prior hx of small RLE DVT in 2016 that she says did not require any treatment.  She also has hx of ascending aortic dissection s/p repair 2016 by Dr. Servando Snare.  She has routine follow up with him and this has been stable since then.  Per chart review, she had CTA chest Feb 2018 which showed possible small non-occlusive PE versus chronic scarring.  In ED, she remained hemodynamically stable on room air.  Denies dyspnea at rest but does endorse some with exertion.  Denies any chest pain, syncope, hemoptysis, LE edema or pain. No hx of prior malignancy, no recent travel or periods of prolonged immobilization.  Denies tobacco use. States at baseline she is fairly active and moves around throughout the day.  Due to large clot burden, PCCM asked to see in consultation.  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  Significant Hospital Events: Including procedures, antibiotic start and stop dates in addition to other pertinent events   3/21 > admit, started on heparin 3/22> stable  Interim History / Subjective:  She denies complaints- no dizziness, CP. SOB improved since yesterday.  Objective:  Blood pressure 135/63, pulse (!) 58, temperature 97.9 F (36.6 C), temperature source Oral, resp. rate 18, height 5\' 3"  (1.6 m), weight 91.2 kg, SpO2 98 %.        Intake/Output Summary (Last 24 hours) at 04/02/2020 0755 Last data filed at 04/01/2020 1600 Gross per 24 hour  Intake 99.41 ml  Output --  Net 99.41 ml   Filed Weights   04/01/20 1436  Weight: 91.2 kg    Examination: General: midded aged woman sitting at the edge of bed in NAD Neuro: Alert, moving all extremities spontaneously HEENT: Downs/AT, eyes anicteric. Oral mucosa moist. Cardiovascular: S1S2, RRR Lungs: CTAB, breathing comfortably on . Speaking in full sentences. Abdomen: soft, NT, ND Musculoskeletal: no peripheral edema, no cyanosis or clubbing Skin: warm, dry, no rashes  Labs/imaging personally reviewed:  CTA chest 3/21 > saddle PE, no lung nodules or opacities. Echo 3/21 > LVEF 50-55%, severely reduced RV dysfunction, mildly enlarged RV. IVC normal size. Normal resp variability.  Moderate AR, dilated aortic root.  LE duplex 3/21 > +DVT R gastrocnemius and peroneal veins  Micro Data: SARS CoV2 3/21 > neg. Flu 3/21 > neg.  Resolved Hospital Problem list:    Assessment & Plan:  Acute respiratory failure with hypoxia Submassive saddle PE, unprovoked.  Of note, does have hx of small DVT in 2016 as  well as possible small PE in 2018 (read as small PE vs chronic scarring). RLE DVT -Continue anticoagulation-- ok to transition to [place] that will be covered by her insurance (preference for Eliquis if an option). We discussed the importance of taking this medication regularly  without missing doses. She will likely need to stay on this medication indefinitely due to history of multiple unprovoked clots. -Needs outpatient follow up in about 1 month with [facility]- appt requested.  -Discontinue heparin gtt once starting Eliquis.  -Discussed bleeding precautions on AC, in addition to the need to come to the ED for evaluation if she ever has head trauma while on AC. -Needs age appropriate cancer screening as an outpatient; ok to defer screening until [facility] can be safely held in 3 months. -If she decompensated from acute right heart failure, IV lytics still appropriate, although this is less at this point. Would monitor another 24 hours in the hospital. -Wean supplemental O2 as able to maintain SpO2 >90%.  Rest of care per primary team.  PCCM will sign off. Please call with questions.   Labs   CBC: Recent Labs  Lab 04/01/20 0800  WBC 6.3  NEUTROABS 3.7  HGB 12.5  HCT 40.6  MCV 74.5*  PLT 834    Basic Metabolic Panel: Recent Labs  Lab 04/01/20 0800 04/02/20 0446  NA 135 138  K 4.0 4.1  CL 107 106  CO2 23 24  GLUCOSE 138* 109*  BUN 13 15  CREATININE 1.34* 1.29*  CALCIUM 9.0 9.1    Julian Hy, DO 04/02/20 2:18 PM [facility]  From 7AM- 7PM if no response to pager, please call [phone]. After hours, 7PM- 7AM, please call Elink  [phone].

## 2020-04-02 NOTE — Plan of Care (Signed)

## 2020-04-03 LAB — BASIC METABOLIC PANEL
Anion gap: 7 (ref 5–15)
BUN: 14 mg/dL (ref 8–23)
CO2: 24 mmol/L (ref 22–32)
Calcium: 9 mg/dL (ref 8.9–10.3)
Chloride: 105 mmol/L (ref 98–111)
Creatinine, Ser: 1.33 mg/dL — ABNORMAL HIGH (ref 0.44–1.00)
GFR, Estimated: 44 mL/min — ABNORMAL LOW (ref >60)
Glucose, Bld: 111 mg/dL — ABNORMAL HIGH (ref 70–99)
Potassium: 4.1 mmol/L (ref 3.5–5.1)
Sodium: 136 mmol/L (ref 135–145)

## 2020-04-03 LAB — CBC
HCT: 39.5 % (ref 36.0–46.0)
Hemoglobin: 12.3 g/dL (ref 12.0–15.0)
MCH: 23.1 pg — ABNORMAL LOW (ref 26.0–34.0)
MCHC: 31.1 g/dL (ref 30.0–36.0)
MCV: 74.1 fL — ABNORMAL LOW (ref 80.0–100.0)
Platelets: 167 10*3/uL (ref 150–400)
RBC: 5.33 MIL/uL — ABNORMAL HIGH (ref 3.87–5.11)
RDW: 15.1 % (ref 11.5–15.5)
WBC: 8.2 10*3/uL (ref 4.0–10.5)
nRBC: 0 % (ref 0.0–0.2)

## 2020-04-03 LAB — HEPARIN LEVEL (UNFRACTIONATED): Heparin Unfractionated: 0.57 IU/mL (ref 0.30–0.70)

## 2020-04-03 LAB — MAGNESIUM: Magnesium: 1.8 mg/dL (ref 1.7–2.4)

## 2020-04-03 MED ORDER — VITAMIN B-12 1000 MCG PO TABS
500.0000 ug | ORAL_TABLET | Freq: Every day | ORAL | Status: DC
  Administered 2020-04-03 – 2020-04-04 (×2): 500 ug via ORAL
  Filled 2020-04-03 (×2): qty 1

## 2020-04-03 MED ORDER — CARVEDILOL 6.25 MG PO TABS
6.2500 mg | ORAL_TABLET | Freq: Two times a day (BID) | ORAL | Status: DC
  Administered 2020-04-03 – 2020-04-04 (×3): 6.25 mg via ORAL
  Filled 2020-04-03 (×3): qty 1

## 2020-04-03 MED ORDER — FERROUS SULFATE 325 (65 FE) MG PO TABS
325.0000 mg | ORAL_TABLET | Freq: Every day | ORAL | Status: DC
  Administered 2020-04-04: 325 mg via ORAL
  Filled 2020-04-03: qty 1

## 2020-04-03 MED ORDER — APIXABAN 5 MG PO TABS
10.0000 mg | ORAL_TABLET | Freq: Two times a day (BID) | ORAL | Status: DC
  Administered 2020-04-03 – 2020-04-04 (×3): 10 mg via ORAL
  Filled 2020-04-03 (×3): qty 2

## 2020-04-03 MED ORDER — SPIRONOLACTONE 12.5 MG HALF TABLET
12.5000 mg | ORAL_TABLET | Freq: Every day | ORAL | Status: DC
  Administered 2020-04-03: 12.5 mg via ORAL
  Filled 2020-04-03: qty 1

## 2020-04-03 MED ORDER — APIXABAN 5 MG PO TABS: 5.0000 mg | ORAL_TABLET | Freq: Two times a day (BID) | ORAL | Status: DC

## 2020-04-03 NOTE — Plan of Care (Signed)
  Problem: Education: Goal: Knowledge of General Education information will improve Description: Including pain rating scale, medication(s)/side effects and non-pharmacologic comfort measures Outcome: Progressing   Problem: Pain Managment: Goal: General experience of comfort will improve Outcome: Progressing   Problem: Safety: Goal: Ability to remain free from injury will improve Outcome: Progressing   

## 2020-04-03 NOTE — Discharge Instructions (Signed)
Information on my medicine - ELIQUIS (apixaban)  Why was Eliquis prescribed for you? Eliquis was prescribed to treat blood clots that may have been found in the veins of your legs (deep vein thrombosis) or in your lungs (pulmonary embolism) and to reduce the risk of them occurring again.  What do You need to know about Eliquis ? The starting dose is 10 mg (two 5 mg tablets) taken TWICE daily for the FIRST SEVEN (7) DAYS, then on the dose is reduced to ONE 5 mg tablet taken TWICE daily.  Eliquis may be taken with or without food.   Try to take the dose about the same time in the morning and in the evening. If you have difficulty swallowing the tablet whole please discuss with your pharmacist how to take the medication safely.  Take Eliquis exactly as prescribed and DO NOT stop taking Eliquis without talking to the doctor who prescribed the medication.  Stopping may increase your risk of developing a new blood clot.  Refill your prescription before you run out.  After discharge, you should have regular check-up appointments with your healthcare provider that is prescribing your Eliquis.    What do you do if you miss a dose? If a dose of ELIQUIS is not taken at the scheduled time, take it as soon as possible on the same day and twice-daily administration should be resumed. The dose should not be doubled to make up for a missed dose.  Important Safety Information A possible side effect of Eliquis is bleeding. You should call your healthcare provider right away if you experience any of the following: Bleeding from an injury or your nose that does not stop. Unusual colored urine (red or dark brown) or unusual colored stools (red or black). Unusual bruising for unknown reasons. A serious fall or if you hit your head (even if there is no bleeding).  Some medicines may interact with Eliquis and might increase your risk of bleeding or clotting while on Eliquis. To help avoid this, consult  your healthcare provider or pharmacist prior to using any new prescription or non-prescription medications, including herbals, vitamins, non-steroidal anti-inflammatory drugs (NSAIDs) and supplements.  This website has more information on Eliquis (apixaban): http://www.eliquis.com/eliquis/home  

## 2020-04-03 NOTE — Plan of Care (Signed)

## 2020-04-03 NOTE — Progress Notes (Signed)
[facility] PROGRESS NOTE    Progress Note  SYRIAH Wheeler  JJH:[id number] DOB: March 04, 1955 DOA: 04/01/2020 PCP: Nolene Ebbs, MD     Brief Narrative:   Alexis Wheeler is an 65 y.o. female with a history of aortic ascending dissection status post repair, aortic valve replacement 2016, CAD, hypertension right upper extremity DVT not treated obstructive sleep apnea on CPAP, dilated cardiomyopathy admitted for shortness of breath 2 days prior to admission was found to have a saddle PE no TPA given started on IV heparin   Assessment/Plan:   Acute respiratory failure with hypoxia due to saddle pulmonary embolism with cor pulmonale: Not a candidate for TPA. Currently on heparin drip will need IV heparin call transition to an oral on NOAC and monitor for 24 hours. Left right lower extremity Doppler was positive for DVT.  Dilated cardiomyopathy now with right ventricular dysfunction due to PE:: Continue aspirin and statins, will slowly start her back on her home dose regimen. Today we will start her on lower dose of Coreg, continue to hold lisinopril and Aldactone.  Elevated troponin/demand ischemia: Secondary to pulmonary embolism.  Microcytic anemia: Ferritin of 02/13/2020, started on oral iron. Vitamin B12 500 we will give her oral repletion.  Essential hypertension: Antihypertensive medication held on admission. Blood pressure slowly trending up, will start her back on her lower dose of her home dose Coreg.  Medical Continue statins.  Coronary artery disease: Continue aspirin and statins.  History of ascending aortic aneurysm status post repair with replacement of ascending aorta and resuspension of her aortic valve in 2016: Able.  Chronic kidney C stage IIIa: Her creatinine seems to be at baseline 1.3.  Static sleep apnea Continue CPAP at night.    DVT prophylaxis: heparin Family Communication:none Status is: Inpatient  Remains inpatient appropriate  because:Hemodynamically unstable   Dispo: The patient is from: Home              Anticipated d/c is to: Home              Patient currently is not medically stable to d/c.   Difficult to place patient No        Code Status:     Code Status Orders  (From admission, onward)         Start     Ordered   04/02/20 1141  Full code  Continuous        04/02/20 1140        Code Status History    Date Active Date Inactive Code Status Order ID Comments User Context   04/01/2020 1242 04/02/2020 1140 DNR [id number]  Harold Hedge, MD ED   03/07/2016 2339 03/08/2016 1938 Full Code [id number]  Reubin Milan, MD ED   03/07/2016 2339 03/07/2016 2339 Full Code [id number]  Reubin Milan, MD ED   12/20/2014 0446 12/21/2014 1522 Full Code [id number]  Norval Morton, MD ED   03/03/2014 1055 03/07/2014 0722 Full Code [id number]  Coolidge Breeze, PA-C Inpatient   Advance Care Planning Activity        IV Access:    Peripheral IV   Procedures and diagnostic studies:   CT Angio Chest PE W and/or Wo Contrast  Result Date: 04/01/2020 CLINICAL DATA:  PE suspected, shortness of breath, history of aortic dissection EXAM: CT ANGIOGRAPHY CHEST WITH CONTRAST TECHNIQUE: Multidetector CT imaging of the chest was performed using the standard protocol during bolus administration of intravenous contrast. Multiplanar CT image reconstructions and MIPs  were obtained to evaluate the vascular anatomy. CONTRAST:  163mL OMNIPAQUE IOHEXOL 350 MG/ML SOLN COMPARISON:  CT chest thoracic angiogram, 11/27/2019 FINDINGS: Cardiovascular: Satisfactory opacification of the pulmonary arteries to the segmental level. Positive examination for pulmonary embolism, with saddle embolus present as well as a large burden of lobar and more distal embolus throughout, greater volume and more occlusive in the right pulmonary arteries. Mild, global cardiomegaly with enlargement of the RV LV ratio, approximately 1.3-1. Main pulmonary  artery is normal in caliber. 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 No pericardial effusion. Mediastinum/Nodes: No enlarged mediastinal, hilar, or axillary lymph nodes. Thyroid gland, trachea, and esophagus demonstrate no significant findings. Lungs/Pleura: Lungs are clear. No pleural effusion or pneumothorax. Upper Abdomen: No acute abnormality. Musculoskeletal: No chest wall abnormality. No acute or significant osseous findings. Review of the MIP images confirms the above findings. IMPRESSION: 1. Positive examination for pulmonary embolism, with saddle embolus present as well as a large burden of lobar and more distal embolus throughout, greater volume and more occlusive in the right pulmonary arteries. 2. Mild, global cardiomegaly with enlargement of the RV LV ratio, approximately 1.3-1, concerning for right heart strain. 3. 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 Aortic Atherosclerosis (ICD10-I70.0). These results were called by telephone at the time of interpretation on 04/01/2020 at 10:38 am to Dr. Gareth Morgan , who verbally acknowledged these results. Electronically Signed   By: Eddie Candle M.D.   On: 04/01/2020 10:41   DG Chest Portable 1 View  Result Date: 04/01/2020 CLINICAL DATA:  Shortness of breath EXAM: PORTABLE CHEST 1 VIEW COMPARISON:  February 01, 2018 chest radiograph;; chest CT November 27, 2019 FINDINGS: Lungs are clear. Heart is upper normal in size with pulmonary vascularity within normal limits. Patient is status post median sternotomy. There is aortic atherosclerosis. No bone lesions. IMPRESSION: Lungs clear. Stable cardiac silhouette. Postoperative change.  Aortic Atherosclerosis (ICD10-I70.0). Electronically Signed   By: Lowella Grip III M.D.   On: 04/01/2020 08:27   ECHOCARDIOGRAM COMPLETE  Result Date: 04/01/2020    ECHOCARDIOGRAM REPORT   Patient Name:   Alexis Wheeler Date of Exam: 04/01/2020 Medical Rec #:  [id number]           Height:       63.0 in Accession #:    [id number]          Weight:       205.0 lb Date of Birth:  Apr 27, 1955            BSA:          1.954 m Patient Age:    71 years            BP:           126/64 mmHg Patient Gender: F                   HR:           56 bpm. Exam Location:  Inpatient Procedure: 2D Echo STAT ECHO                            MODIFIED REPORT: This report was modified by Dorris Carnes MD on 04/01/2020 due to complete.  Indications:     pulmonary emboli  History:         Patient has prior history of Echocardiogram examinations, most                  recent 04/07/2019. Aortic dissection repair, CAD; Risk                  Factors:Dyslipidemia, Hypertension and Sleep Apnea.  Sonographer:     Johny Chess Referring Phys:  [id number] RAHUL P DESAI Diagnosing Phys: Dorris Carnes MD IMPRESSIONS  1. Left ventricular ejection fraction, by estimation, is 50 to 55%. The left ventricle has low normal function. The left ventricle has no regional wall motion abnormalities. Left ventricular diastolic function could not be evaluated.  2. Compared to echo from March 2021, RV dysfunction is new.. Right ventricular systolic function is severely reduced. The right ventricular size is mildly enlarged.  3. The mitral valve is normal in structure. Trivial mitral valve regurgitation.  4. The aortic valve is tricuspid. Aortic valve regurgitation is moderate. Mild aortic valve sclerosis is present, with no evidence of aortic valve stenosis.  5. Aortic dilatation noted. There is mild dilatation of the aortic root, measuring 38 mm.  6. The inferior vena cava is normal in size with greater than 50% respiratory variability, suggesting right atrial  pressure of 3 mmHg. FINDINGS  Left Ventricle: Left ventricular ejection fraction, by estimation, is 50 to 55%. The left ventricle has low normal function. The left ventricle has no regional wall motion abnormalities. The left ventricular internal cavity size was normal in size. There is no left ventricular hypertrophy. Left ventricular diastolic function could not be evaluated. Right Ventricle: Compared to echo from March 2021, RV dysfunction is new. The right ventricular size is mildly enlarged. Right vetricular wall thickness was not assessed. Right ventricular systolic function is severely reduced. Left Atrium: Left atrial size was normal in size. Right Atrium: Right atrial size was normal in size. Pericardium: There is no evidence of pericardial effusion. Mitral Valve: The mitral valve is normal in structure. Trivial mitral valve regurgitation. Tricuspid Valve: The tricuspid valve is normal in structure. Tricuspid valve regurgitation is trivial. Aortic Valve: The aortic valve is tricuspid. Aortic valve regurgitation is moderate. Mild aortic valve sclerosis is present, with no evidence of aortic valve stenosis. Pulmonic Valve: The pulmonic valve was normal in structure. Pulmonic valve regurgitation is trivial. Aorta: Aortic dilatation noted. There is mild dilatation of the aortic root, measuring 38 mm. Venous: The inferior vena cava is normal in size with greater than 50% respiratory variability, suggesting right atrial pressure of 3 mmHg. IAS/Shunts: No atrial level shunt detected by color flow Doppler.  LEFT VENTRICLE PLAX 2D LVIDd:         3.70 cm  Diastology LVIDs:         2.70 cm  LV e' medial:  5.77 cm/s LV PW:         1.10 cm  LV e' lateral: 5.87 cm/s LV IVS:        1.00 cm LVOT diam:     1.90 cm LV SV:         49 LV SV Index:   25 LVOT Area:     2.84 cm  RIGHT VENTRICLE            IVC RV S prime:     9.14 cm/s  IVC diam: 1.50 cm TAPSE (M-mode): 1.3 cm LEFT ATRIUM  Index       RIGHT ATRIUM            Index LA diam:        3.70 cm 1.89 cm/m  RA Area:     11.10 cm LA Vol (A2C):   34.5 ml 17.66 ml/m RA Volume:   20.50 ml  10.49 ml/m LA Vol (A4C):   35.0 ml 17.91 ml/m LA Biplane Vol: 35.2 ml 18.01 ml/m  AORTIC VALVE LVOT Vmax:   83.80 cm/s LVOT Vmean:  47.400 cm/s LVOT VTI:    0.174 m  AORTA Ao Root diam: 3.80 cm Ao Asc diam:  3.30 cm  SHUNTS Systemic VTI:  0.17 m Systemic Diam: 1.90 cm Dorris Carnes MD Electronically signed by Dorris Carnes MD Signature Date/Time: 04/01/2020/1:57:10 PM    Final (Updated)    VAS [place] LOWER EXTREMITY VENOUS (DVT)  Result Date: 04/01/2020  Lower Venous DVT Study Indications: Saddle pulmonary embolism.  Risk Factors: DVT Prior right peroneal DVT noted on study done 03/15/18. Comparison Study: Prior RLE venous study done 11/09/18 showed chronic right                   peroneal vein DVT Performing Technologist: Sharion Dove RVS  Examination Guidelines: A complete evaluation includes B-mode imaging, spectral Doppler, color Doppler, and power Doppler as needed of all accessible portions of each vessel. Bilateral testing is considered an integral part of a complete examination. Limited examinations for reoccurring indications may be performed as noted. The reflux portion of the exam is performed with the patient in reverse Trendelenburg.  +---------+---------------+---------+-----------+----------+--------------+ RIGHT    CompressibilityPhasicitySpontaneityPropertiesThrombus Aging +---------+---------------+---------+-----------+----------+--------------+ CFV      Full                                                        +---------+---------------+---------+-----------+----------+--------------+ SFJ      Full                                                        +---------+---------------+---------+-----------+----------+--------------+ FV Prox  Full                                                         +---------+---------------+---------+-----------+----------+--------------+ FV Mid   Full                                                        +---------+---------------+---------+-----------+----------+--------------+ FV DistalFull                                                        +---------+---------------+---------+-----------+----------+--------------+ PFV      Full                                                        +---------+---------------+---------+-----------+----------+--------------+  POP      Full           Yes      Yes                                 +---------+---------------+---------+-----------+----------+--------------+ PTV      Full                                                        +---------+---------------+---------+-----------+----------+--------------+ PERO     None                                         Acute          +---------+---------------+---------+-----------+----------+--------------+ Gastroc  None                                         Acute          +---------+---------------+---------+-----------+----------+--------------+   +---------+---------------+---------+-----------+----------+--------------+ LEFT     CompressibilityPhasicitySpontaneityPropertiesThrombus Aging +---------+---------------+---------+-----------+----------+--------------+ CFV      Full           Yes      Yes                                 +---------+---------------+---------+-----------+----------+--------------+ SFJ      Full                                                        +---------+---------------+---------+-----------+----------+--------------+ FV Prox  Full                                                        +---------+---------------+---------+-----------+----------+--------------+ FV Mid   Full                                                         +---------+---------------+---------+-----------+----------+--------------+ FV DistalFull                                                        +---------+---------------+---------+-----------+----------+--------------+ PFV      Full                                                        +---------+---------------+---------+-----------+----------+--------------+  POP      Full           Yes      Yes                                 +---------+---------------+---------+-----------+----------+--------------+ PTV      Full                                                        +---------+---------------+---------+-----------+----------+--------------+ PERO     Full                                                        +---------+---------------+---------+-----------+----------+--------------+ Gastroc  Full                                                        +---------+---------------+---------+-----------+----------+--------------+     Summary: RIGHT: - Findings consistent with acute deep vein thrombosis involving the right gastrocnemius veins, and right peroneal veins. - Findings suggest new clot progression as compared to previous examination.  LEFT: - There is no evidence of deep vein thrombosis in the lower extremity.  *See table(s) above for measurements and observations. Electronically signed by Servando Snare MD on 04/01/2020 at 10:17:40 PM.    Final      Medical Consultants:    None.   Subjective:    [name] she relates she feels left leg tightness breathing is improved.  Objective:    Vitals:   04/02/20 1152 04/02/20 1554 04/02/20 2056 04/03/20 0504  BP: 137/73 (!) 142/59 115/62 (!) 143/67  Pulse: 67 67 68 93  Resp: 15 19  13   Temp: 98.8 F (37.1 C) 97.8 F (36.6 C) 98.8 F (37.1 C) 98.3 F (36.8 C)  TempSrc: Oral Oral Oral Oral  SpO2: 95% 98% 97% 96%  Weight:      Height:       SpO2: 96 %   Intake/Output Summary (Last 24  hours) at 04/03/2020 0749 Last data filed at 04/02/2020 1552 Gross per 24 hour  Intake 240 ml  Output 1500 ml  Net -1260 ml   Filed Weights   04/01/20 1436  Weight: 91.2 kg    Exam: General exam: In no acute distress. Respiratory system: Good air movement and clear to auscultation. Cardiovascular system: S1 & S2 heard, RRR. No JVD. Gastrointestinal system: Abdomen is nondistended, soft and nontender.  Extremities: No pedal edema. Skin: No rashes, lesions or ulcers Psychiatry: Judgement and insight appear normal. Mood & affect appropriate.    Data Reviewed:    Labs: Basic Metabolic Panel: Recent Labs  Lab 04/01/20 0800 04/02/20 0446 04/03/20 0313  NA 135 138 136  K 4.0 4.1 4.1  CL 107 106 105  CO2 23 24 24   GLUCOSE 138* 109* 111*  BUN 13 15 14   CREATININE 1.34* 1.29* 1.33*  CALCIUM 9.0 9.1 9.0  MG  --   --  1.8  GFR Estimated Creatinine Clearance: 45.2 mL/min (A) (by C-G formula based on SCr of 1.33 mg/dL (H)). Liver Function Tests: Recent Labs  Lab 04/01/20 0800  AST 18  ALT 21  ALKPHOS 69  BILITOT 0.4  PROT 6.4*  ALBUMIN 3.5   No results for input(s): LIPASE, AMYLASE in the last 168 hours. No results for input(s): AMMONIA in the last 168 hours. Coagulation profile No results for input(s): INR, PROTIME in the last 168 hours. COVID-19 Labs  Recent Labs    04/02/20 0931  FERRITIN 122    Lab Results  Component Value Date   [place] NEGATIVE 04/01/2020    CBC: Recent Labs  Lab 04/01/20 0800 04/03/20 0313  WBC 6.3 8.2  NEUTROABS 3.7  --   HGB 12.5 12.3  HCT 40.6 39.5  MCV 74.5* 74.1*  PLT 164 167   Cardiac Enzymes: No results for input(s): CKTOTAL, CKMB, CKMBINDEX, TROPONINI in the last 168 hours. BNP (last 3 results) No results for input(s): PROBNP in the last 8760 hours. CBG: No results for input(s): GLUCAP in the last 168 hours. D-Dimer: No results for input(s): DDIMER in the last 72 hours. Hgb A1c: No results for input(s):  HGBA1C in the last 72 hours. Lipid Profile: No results for input(s): CHOL, HDL, LDLCALC, TRIG, CHOLHDL, LDLDIRECT in the last 72 hours. Thyroid function studies: Recent Labs    04/02/20 0931  TSH 1.017   Anemia work up: Recent Labs    04/02/20 0931  VITAMINB12 517  FERRITIN 122  TIBC 382  IRON 83   Sepsis Labs: Recent Labs  Lab 04/01/20 0800 04/03/20 0313  WBC 6.3 8.2   Microbiology Recent Results (from the past 240 hour(s))  Resp Panel by RT-PCR (Flu A&B, Covid) Nasopharyngeal Swab     Status: None   Collection Time: 04/01/20  8:01 AM   Specimen: Nasopharyngeal Swab; Nasopharyngeal(NP) swabs in vial transport medium  Result Value Ref Range Status   SARS Coronavirus 2 by RT PCR NEGATIVE NEGATIVE Final    Comment: (NOTE) SARS-CoV-2 target nucleic acids are NOT DETECTED.  The SARS-CoV-2 RNA is generally detectable in upper respiratory specimens during the acute phase of infection. The lowest concentration of SARS-CoV-2 viral copies this assay can detect is 138 copies/mL. A negative result does not preclude SARS-Cov-2 infection and should not be used as the sole basis for treatment or other patient management decisions. A negative result may occur with  improper specimen collection/handling, submission of specimen other than nasopharyngeal swab, presence of viral mutation(s) within the areas targeted by this assay, and inadequate number of viral copies(<138 copies/mL). A negative result must be combined with clinical observations, patient history, and epidemiological information. The expected result is Negative.  Fact Sheet for Patients:  EntrepreneurPulse.com.au  Fact Sheet for Healthcare Providers:  IncredibleEmployment.be  This test is no t yet approved or cleared by the [place] FDA and  has been authorized for detection and/or diagnosis of SARS-CoV-2 by FDA under an Emergency Use Authorization (EUA). This EUA will  remain  in effect (meaning this test can be used) for the duration of the COVID-19 declaration under Section 564(b)(1) of the Act, 21 U.S.C.section 360bbb-3(b)(1), unless the authorization is terminated  or revoked sooner.       Influenza A by PCR NEGATIVE NEGATIVE Final   Influenza B by PCR NEGATIVE NEGATIVE Final    Comment: (NOTE) The Xpert Xpress SARS-CoV-2/FLU/RSV plus assay is intended as an aid in the diagnosis of influenza from Nasopharyngeal swab specimens and  should not be used as a sole basis for treatment. Nasal washings and aspirates are unacceptable for Xpert Xpress SARS-CoV-2/FLU/RSV testing.  Fact Sheet for Patients: EntrepreneurPulse.com.au  Fact Sheet for Healthcare Providers: IncredibleEmployment.be  This test is not yet approved or cleared by the [place] FDA and has been authorized for detection and/or diagnosis of SARS-CoV-2 by FDA under an Emergency Use Authorization (EUA). This EUA will remain in effect (meaning this test can be used) for the duration of the COVID-19 declaration under Section 564(b)(1) of the Act, 21 U.S.C. section 360bbb-3(b)(1), unless the authorization is terminated or revoked.  Performed at [facility], [place] [address] [place], [place] [address]      Medications:   . aspirin EC  81 mg Oral Daily  . atorvastatin  40 mg Oral QPM  . colesevelam  625 mg Oral BID WC  . folic acid  1 mg Oral Daily  . sodium chloride flush  3 mL Intravenous Q12H   Continuous Infusions: . heparin 950 Units/hr (04/02/20 1634)      LOS: 2 days   Charlynne Cousins  [facility]  04/03/2020, 7:49 AM

## 2020-04-03 NOTE — Telephone Encounter (Signed)
Spoke with the pt  Pt scheduled for HFU with Dr Shearon Stalls for 05/13/20

## 2020-04-03 NOTE — Progress Notes (Addendum)
ANTICOAGULATION CONSULT NOTE - Follow Up Consult  Pharmacy Consult for heparin to Eliquis Indication: pulmonary embolus  Labs: Recent Labs    04/01/20 0800 04/01/20 1041 04/01/20 1700 04/02/20 0446 04/02/20 1358 04/03/20 0313  HGB 12.5  --   --   --   --  12.3  HCT 40.6  --   --   --   --  39.5  PLT 164  --   --   --   --  167  HEPARINUNFRC  --   --    < > 0.77* 0.59 0.57  CREATININE 1.34*  --   --  1.29*  --  1.33*  TROPONINIHS 54* 54*  --   --   --   --    < > = values in this interval not displayed.    Assessment: 65yo female therapeutic on heparin No gtt issues or signs of bleeding per RN.  Goal of Therapy:  Heparin level 0.3-0.7 units/ml   Plan:  Continue heparin at 950 units / hr Follow up AM labs  Thank you Anette Guarneri, PharmD    04/03/2020,8:06 AM   8:30 am - Transitioned to Eliquis 10 mg po BID x 7 days, then 5 mg po BID

## 2020-04-03 NOTE — Progress Notes (Addendum)
Transitions of Care Pharmacist Note  ALYSHIA KERNAN is a 65 y.o. female that has been diagnosed with PE and will be prescribed Eliquis (apixaban) at discharge.   Patient Education: I provided the following education on Eliquis to the patient: How to take the medication Described what the medication is Signs of bleeding Signs/symptoms of VTE and stroke  Answered their questions  Discharge Medications Plan: The patient wants to have their discharge medications filled by the Transitions of Care pharmacy rather than their usual pharmacy.  The discharge orders pharmacy has been changed to the Transitions of Care pharmacy, the patient will receive a phone call regarding co-pay, and their medications will be delivered by the Transitions of Care pharmacy.   Thank you,   Fara Olden, PharmD PGY-1 Pharmacy Resident 04/03/2020 5:39 PM

## 2020-04-04 ENCOUNTER — Ambulatory Visit: Payer: Medicare Other | Admitting: Cardiology

## 2020-04-04 ENCOUNTER — Other Ambulatory Visit: Payer: Self-pay | Admitting: Internal Medicine

## 2020-04-04 LAB — BASIC METABOLIC PANEL
Anion gap: 7 (ref 5–15)
BUN: 16 mg/dL (ref 8–23)
CO2: 22 mmol/L (ref 22–32)
Calcium: 8.9 mg/dL (ref 8.9–10.3)
Chloride: 105 mmol/L (ref 98–111)
Creatinine, Ser: 1.43 mg/dL — ABNORMAL HIGH (ref 0.44–1.00)
GFR, Estimated: 41 mL/min — ABNORMAL LOW (ref >60)
Glucose, Bld: 102 mg/dL — ABNORMAL HIGH (ref 70–99)
Potassium: 5 mmol/L (ref 3.5–5.1)
Sodium: 134 mmol/L — ABNORMAL LOW (ref 135–145)

## 2020-04-04 LAB — CBC
HCT: 37.4 % (ref 36.0–46.0)
Hemoglobin: 12.5 g/dL (ref 12.0–15.0)
MCH: 24.1 pg — ABNORMAL LOW (ref 26.0–34.0)
MCHC: 33.4 g/dL (ref 30.0–36.0)
MCV: 72.2 fL — ABNORMAL LOW (ref 80.0–100.0)
Platelets: 191 10*3/uL (ref 150–400)
RBC: 5.18 MIL/uL — ABNORMAL HIGH (ref 3.87–5.11)
RDW: 15.3 % (ref 11.5–15.5)
WBC: 6.9 10*3/uL (ref 4.0–10.5)
nRBC: 0 % (ref 0.0–0.2)

## 2020-04-04 LAB — MAGNESIUM: Magnesium: 1.9 mg/dL (ref 1.7–2.4)

## 2020-04-04 MED ORDER — FERROUS SULFATE 325 (65 FE) MG PO TABS: 325.0000 mg | ORAL_TABLET | Freq: Every day | ORAL | 3 refills | Status: DC

## 2020-04-04 MED ORDER — APIXABAN 5 MG PO TABS: 5.0000 mg | ORAL_TABLET | Freq: Two times a day (BID) | ORAL | 0 refills | Status: DC

## 2020-04-04 MED ORDER — APIXABAN 5 MG PO TABS
10.0000 mg | ORAL_TABLET | Freq: Two times a day (BID) | ORAL | 0 refills | Status: DC
Start: 2020-04-04 — End: 2020-04-04

## 2020-04-04 MED ORDER — APIXABAN (ELIQUIS) VTE STARTER PACK (10MG AND 5MG): ORAL_TABLET | ORAL | 0 refills | Status: DC

## 2020-04-04 MED FILL — FERROUS SULFATE 325 MG TAB: 325 (65 FE) | 30 days supply | Qty: 30 | Fill #0

## 2020-04-04 MED FILL — ELIQUIS STARTER PACK 5 MG T: 5 | 30 days supply | Qty: 74 | Fill #0

## 2020-04-04 NOTE — Progress Notes (Signed)
Patient discharged in stable condition. AVS documentation given and explained. All questions were answered. All concerns  /questions were addressed.

## 2020-04-04 NOTE — Discharge Summary (Addendum)
Physician Discharge Summary  Alexis Wheeler ZOX:[id number] DOB: 1955-02-19 DOA: 04/01/2020  PCP: Nolene Ebbs, MD  Admit date: 04/01/2020 Discharge date: 04/04/2020  Admitted From: Home Disposition:  Home  Recommendations for Outpatient Follow-up:  1. Follow up with PCP in 1-2 weeks 2. Please obtain BMP/CBC in one week 3. Check blood pressure and resume antihypertensive medications as tolerated.  Home Health:No Equipment/Devices:None  Discharge Condition:Stable CODE STATUS:Full Diet recommendation: Heart Healthy   Brief/Interim Summary: 65 y.o. female with a history of aortic ascending dissection status post repair, aortic valve replacement 2016, CAD, hypertension right upper extremity DVT not treated obstructive sleep apnea on CPAP, dilated cardiomyopathy admitted for shortness of breath 2 days prior to admission was found to have a saddle PE no TPA given started on IV heparin  Discharge Diagnoses:  Principal Problem:   Acute pulmonary embolism ([place]) Active Problems:   Hyperlipidemia with target LDL less than 70   S/P aortic dissection repair   Dilated cardiomyopathy ([place])   Coronary artery disease   OSA on CPAP   Benign essential HTN   Saddle embolus of pulmonary artery (HCC) Acute respiratory failure with hypoxia due to saddle PE and cor pulmonale: Not a candidate for TPA on admission, started on IV heparin and then transition to oral Norco which she tolerated. CT angio of the chest was positive for PE. Lower extremity Doppler was positive for left and right lower extremity DVT. There were no signs of overt bleeding her antihypertensive medications were held. She will continue Eliquis as an outpatient.  Dilated cardiomyopathy now with right-sided ventricular dysfunction due to PE: She was continued on aspirin and statins. She was started back on her home dose of Coreg. Lisinopril and Aldactone were held as her blood pressure was tenuous. She will follow up with PCP  and will restart antihypertensive medications as needed.  Elevated troponin/demand ischemia: Likely due to PE, demand ischemia.  Microcytic anemia: Ferritin was low she was started on iron supplements. Her B12 was checked which was 12 she was started on B12 oral repletion.  Essential hypertension: Antihypertensive medications were held on admission. As her blood pressure started to trend up she was started back on her home Coreg, but her blood pressure remained tenuous. So lisinopril and Aldactone were held she will follow up with her primary care doctor as an outpatient and resume antihypertensive medications as tolerated.  Hyperlipidemia: Continue statins.  Coronary artery disease: Continue aspirin and statins.  History of ascending aortic aneurysm status post repair with replacement of ascending aorta and resuspension of her aortic valve in 2016: Noted.  Chronic kidney C stage IIIa: Creatinine returned remained at baseline of 1.3-1.5. It will be a good idea to keep her off the Aldactone due to her chronic kidney disease.   Discharge Instructions  Discharge Instructions    Diet - low sodium heart healthy   Complete by: As directed    Increase activity slowly   Complete by: As directed      Allergies as of 04/04/2020      Reactions   Hydralazine Nausea Only   Motrin [ibuprofen] Nausea Only   Upset GI (motrin brand causes the side effect)      Medication List    STOP taking these medications   lisinopril 40 MG tablet Commonly known as: ZESTRIL   spironolactone 50 MG tablet Commonly known as: ALDACTONE     TAKE these medications   amLODipine 10 MG tablet Commonly known as: NORVASC Take 1 tablet (10 mg  total) by mouth daily.   Apixaban Starter Pack (10mg  and 5mg ) Commonly known as: ELIQUIS STARTER PACK Take as directed on package: start with two-5mg  tablets twice daily for 7 days. On day 8, switch to one-5mg  tablet twice daily.   aspirin 81 MG EC  tablet Take 1 tablet (81 mg total) by mouth daily.   atorvastatin 40 MG tablet Commonly known as: LIPITOR TAKE 1 TABLET BY MOUTH ONCE DAILY AT 6:00 IN THE  EVENING What changed: See the new instructions.   carvedilol 12.5 MG tablet Commonly known as: COREG Take 1 tablet by mouth twice daily   colesevelam 625 MG tablet Commonly known as: Welchol Take one tablet before lunch and bedtime.  Avoid taking within 2-3 hours of other medications to prevent interaction   diclofenac Sodium 1 % Gel Commonly known as: VOLTAREN Apply 2-4 g topically 4 (four) times daily as needed (pain).   dicyclomine 20 MG tablet Commonly known as: BENTYL Take 1 tablet (20 mg total) by mouth 3 (three) times daily as needed for spasms.   ferrous sulfate 325 (65 FE) MG tablet Take 1 tablet (325 mg total) by mouth daily with breakfast.   folic acid 1 MG tablet Commonly known as: FOLVITE Take 1 mg by mouth daily.   Vitamin D (Ergocalciferol) 1.25 MG (50000 UNIT) Caps capsule Commonly known as: DRISDOL Take 50,000 Units by mouth once a week.   vitamin E 180 MG (400 UNITS) capsule Take 400 Units by mouth daily.       Allergies  Allergen Reactions  . Hydralazine Nausea Only  . Motrin [Ibuprofen] Nausea Only    Upset GI (motrin brand causes the side effect)    Consultations:  PCCM   Procedures/Studies: CT Angio Chest PE W and/or Wo Contrast  Result Date: 04/01/2020 CLINICAL DATA:  PE suspected, shortness of breath, history of aortic dissection EXAM: CT ANGIOGRAPHY CHEST WITH CONTRAST TECHNIQUE: Multidetector CT imaging of the chest was performed using the standard protocol during bolus administration of intravenous contrast. Multiplanar CT image reconstructions and MIPs were obtained to evaluate the vascular anatomy. CONTRAST:  169mL OMNIPAQUE IOHEXOL 350 MG/ML SOLN COMPARISON:  CT chest thoracic angiogram, 11/27/2019 FINDINGS: Cardiovascular: Satisfactory opacification of the pulmonary arteries to  the segmental level. Positive examination for pulmonary embolism, with saddle embolus present as well as a large burden of lobar and more distal embolus throughout, greater volume and more occlusive in the right pulmonary arteries. Mild, global cardiomegaly with enlargement of the RV LV ratio, approximately 1.3-1. Main pulmonary artery is normal in caliber. 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 No pericardial effusion. Mediastinum/Nodes: No enlarged mediastinal, hilar, or axillary lymph nodes. Thyroid gland, trachea, and esophagus demonstrate no significant findings. Lungs/Pleura: Lungs are clear. No pleural effusion or pneumothorax. Upper Abdomen: No acute abnormality. Musculoskeletal: No chest wall abnormality. No acute or significant osseous findings. Review of the MIP images confirms the above findings. IMPRESSION: 1. Positive examination for pulmonary embolism, with saddle embolus present as well as a large burden of lobar and more distal embolus throughout, greater volume and more occlusive in the right pulmonary arteries. 2. Mild, global cardiomegaly with enlargement of the RV LV ratio, approximately 1.3-1, concerning for right heart strain. 3. The patient's known chronic aortic dissection and Bentall type ascending thoracic aortic graft repair is not significantly changed in noncontrast appearance compared to prior  angiogram. Aortic Atherosclerosis (ICD10-I70.0). These results were called by telephone at the time of interpretation on 04/01/2020 at 10:38 am to Dr. Gareth Morgan , who verbally acknowledged these results. Electronically Signed   By: Eddie Candle M.D.   On: 04/01/2020 10:41   DG Chest Portable 1 View  Result Date: 04/01/2020 CLINICAL DATA:  Shortness of breath EXAM: PORTABLE CHEST 1 VIEW COMPARISON:  February 01, 2018 chest radiograph;; chest CT November 27, 2019 FINDINGS: Lungs are clear. Heart is upper normal in size with pulmonary vascularity within normal limits. Patient is status post median sternotomy. There is aortic atherosclerosis. No bone lesions. IMPRESSION: Lungs clear. Stable cardiac silhouette. Postoperative change. Aortic Atherosclerosis (ICD10-I70.0). Electronically Signed   By: Lowella Grip III M.D.   On: 04/01/2020 08:27   ECHOCARDIOGRAM COMPLETE  Result Date: 04/01/2020    ECHOCARDIOGRAM REPORT   Patient Name:   Alexis Wheeler Date of Exam: 04/01/2020 Medical Rec #:  [id number]           Height:       63.0 in Accession #:    [id number]          Weight:       205.0 lb Date of Birth:  Mar 16, 1955            BSA:          1.954 m Patient Age:    43 years            BP:           126/64 mmHg Patient Gender: F                   HR:           56 bpm. Exam Location:  Inpatient Procedure: 2D Echo STAT ECHO                            MODIFIED REPORT: This report was modified by Dorris Carnes MD on 04/01/2020 due to complete.  Indications:     pulmonary emboli  History:         Patient has prior history of Echocardiogram examinations, most                  recent 04/07/2019. Aortic dissection repair, CAD; Risk                  Factors:Dyslipidemia, Hypertension and Sleep Apnea.  Sonographer:     Johny Chess Referring Phys:  [id number] RAHUL P DESAI Diagnosing Phys: Dorris Carnes MD IMPRESSIONS  1. Left ventricular ejection fraction, by estimation, is 50 to 55%. The left ventricle has low normal function. The left ventricle has no regional wall motion abnormalities. Left ventricular diastolic function could not be evaluated.  2. Compared to echo from March 2021, RV dysfunction is new.. Right ventricular systolic function is severely reduced. The right ventricular size is mildly enlarged.  3. The mitral valve is normal in structure. Trivial mitral valve regurgitation.  4. The aortic valve is tricuspid. Aortic  valve regurgitation is moderate. Mild aortic valve sclerosis is present, with no evidence of aortic valve stenosis.  5. Aortic dilatation noted. There is mild dilatation of the aortic root, measuring 38 mm.  6. The inferior vena cava is normal in size with greater than 50% respiratory variability, suggesting right atrial pressure of 3 mmHg. FINDINGS  Left Ventricle: Left ventricular ejection fraction, by estimation, is 50 to 55%. The  left ventricle has low normal function. The left ventricle has no regional wall motion abnormalities. The left ventricular internal cavity size was normal in size. There is no left ventricular hypertrophy. Left ventricular diastolic function could not be evaluated. Right Ventricle: Compared to echo from March 2021, RV dysfunction is new. The right ventricular size is mildly enlarged. Right vetricular wall thickness was not assessed. Right ventricular systolic function is severely reduced. Left Atrium: Left atrial size was normal in size. Right Atrium: Right atrial size was normal in size. Pericardium: There is no evidence of pericardial effusion. Mitral Valve: The mitral valve is normal in structure. Trivial mitral valve regurgitation. Tricuspid Valve: The tricuspid valve is normal in structure. Tricuspid valve regurgitation is trivial. Aortic Valve: The aortic valve is tricuspid. Aortic valve regurgitation is moderate. Mild aortic valve sclerosis is present, with no evidence of aortic valve stenosis. Pulmonic Valve: The pulmonic valve was normal in structure. Pulmonic valve regurgitation is trivial. Aorta: Aortic dilatation noted. There is mild dilatation of the aortic root, measuring 38 mm. Venous: The inferior vena cava is normal in size with greater than 50% respiratory variability, suggesting right atrial pressure of 3 mmHg. IAS/Shunts: No atrial level shunt detected by color flow Doppler.  LEFT VENTRICLE PLAX 2D LVIDd:         3.70 cm  Diastology LVIDs:         2.70 cm  LV e'  medial:  5.77 cm/s LV PW:         1.10 cm  LV e' lateral: 5.87 cm/s LV IVS:        1.00 cm LVOT diam:     1.90 cm LV SV:         49 LV SV Index:   25 LVOT Area:     2.84 cm  RIGHT VENTRICLE            IVC RV S prime:     9.14 cm/s  IVC diam: 1.50 cm TAPSE (M-mode): 1.3 cm LEFT ATRIUM             Index       RIGHT ATRIUM           Index LA diam:        3.70 cm 1.89 cm/m  RA Area:     11.10 cm LA Vol (A2C):   34.5 ml 17.66 ml/m RA Volume:   20.50 ml  10.49 ml/m LA Vol (A4C):   35.0 ml 17.91 ml/m LA Biplane Vol: 35.2 ml 18.01 ml/m  AORTIC VALVE LVOT Vmax:   83.80 cm/s LVOT Vmean:  47.400 cm/s LVOT VTI:    0.174 m  AORTA Ao Root diam: 3.80 cm Ao Asc diam:  3.30 cm  SHUNTS Systemic VTI:  0.17 m Systemic Diam: 1.90 cm Dorris Carnes MD Electronically signed by Dorris Carnes MD Signature Date/Time: 04/01/2020/1:57:10 PM    Final (Updated)    VAS [place] LOWER EXTREMITY VENOUS (DVT)  Result Date: 04/01/2020  Lower Venous DVT Study Indications: Saddle pulmonary embolism.  Risk Factors: DVT Prior right peroneal DVT noted on study done 03/15/18. Comparison Study: Prior RLE venous study done 11/09/18 showed chronic right                   peroneal vein DVT Performing Technologist: Sharion Dove RVS  Examination Guidelines: A complete evaluation includes B-mode imaging, spectral Doppler, color Doppler, and power Doppler as needed of all accessible portions of each vessel. Bilateral testing is considered an integral part  of a complete examination. Limited examinations for reoccurring indications may be performed as noted. The reflux portion of the exam is performed with the patient in reverse Trendelenburg.  +---------+---------------+---------+-----------+----------+--------------+ RIGHT    CompressibilityPhasicitySpontaneityPropertiesThrombus Aging +---------+---------------+---------+-----------+----------+--------------+ CFV      Full                                                         +---------+---------------+---------+-----------+----------+--------------+ SFJ      Full                                                        +---------+---------------+---------+-----------+----------+--------------+ FV Prox  Full                                                        +---------+---------------+---------+-----------+----------+--------------+ FV Mid   Full                                                        +---------+---------------+---------+-----------+----------+--------------+ FV DistalFull                                                        +---------+---------------+---------+-----------+----------+--------------+ PFV      Full                                                        +---------+---------------+---------+-----------+----------+--------------+ POP      Full           Yes      Yes                                 +---------+---------------+---------+-----------+----------+--------------+ PTV      Full                                                        +---------+---------------+---------+-----------+----------+--------------+ PERO     None                                         Acute          +---------+---------------+---------+-----------+----------+--------------+ Gastroc  None  Acute          +---------+---------------+---------+-----------+----------+--------------+   +---------+---------------+---------+-----------+----------+--------------+ LEFT     CompressibilityPhasicitySpontaneityPropertiesThrombus Aging +---------+---------------+---------+-----------+----------+--------------+ CFV      Full           Yes      Yes                                 +---------+---------------+---------+-----------+----------+--------------+ SFJ      Full                                                         +---------+---------------+---------+-----------+----------+--------------+ FV Prox  Full                                                        +---------+---------------+---------+-----------+----------+--------------+ FV Mid   Full                                                        +---------+---------------+---------+-----------+----------+--------------+ FV DistalFull                                                        +---------+---------------+---------+-----------+----------+--------------+ PFV      Full                                                        +---------+---------------+---------+-----------+----------+--------------+ POP      Full           Yes      Yes                                 +---------+---------------+---------+-----------+----------+--------------+ PTV      Full                                                        +---------+---------------+---------+-----------+----------+--------------+ PERO     Full                                                        +---------+---------------+---------+-----------+----------+--------------+ Gastroc  Full                                                        +---------+---------------+---------+-----------+----------+--------------+  Summary: RIGHT: - Findings consistent with acute deep vein thrombosis involving the right gastrocnemius veins, and right peroneal veins. - Findings suggest new clot progression as compared to previous examination.  LEFT: - There is no evidence of deep vein thrombosis in the lower extremity.  *See table(s) above for measurements and observations. Electronically signed by Servando Snare MD on 04/01/2020 at 10:17:40 PM.    Final    (Echo, Carotid, EGD, Colonoscopy, ERCP)    Subjective: No new complaints  Discharge Exam: Vitals:   04/04/20 0821 04/04/20 0859  BP: 140/73   Pulse:    Resp:    Temp:    SpO2:  99%   Vitals:   04/04/20  0356 04/04/20 0731 04/04/20 0821 04/04/20 0859  BP: (!) 151/54 134/67 140/73   Pulse: 66     Resp: 16 14    Temp: 98.8 F (37.1 C) 98.5 F (36.9 C)    TempSrc: Oral Oral    SpO2: 98% 98%  99%  Weight:      Height:        General: Pt is alert, awake, not in acute distress Cardiovascular: RRR, S1/S2 +, no rubs, no gallops Respiratory: CTA bilaterally, no wheezing, no rhonchi Abdominal: Soft, NT, ND, bowel sounds + Extremities: no edema, no cyanosis    The results of significant diagnostics from this hospitalization (including imaging, microbiology, ancillary and laboratory) are listed below for reference.     Microbiology: Recent Results (from the past 240 hour(s))  Resp Panel by RT-PCR (Flu A&B, Covid) Nasopharyngeal Swab     Status: None   Collection Time: 04/01/20  8:01 AM   Specimen: Nasopharyngeal Swab; Nasopharyngeal(NP) swabs in vial transport medium  Result Value Ref Range Status   SARS Coronavirus 2 by RT PCR NEGATIVE NEGATIVE Final    Comment: (NOTE) SARS-CoV-2 target nucleic acids are NOT DETECTED.  The SARS-CoV-2 RNA is generally detectable in upper respiratory specimens during the acute phase of infection. The lowest concentration of SARS-CoV-2 viral copies this assay can detect is 138 copies/mL. A negative result does not preclude SARS-Cov-2 infection and should not be used as the sole basis for treatment or other patient management decisions. A negative result may occur with  improper specimen collection/handling, submission of specimen other than nasopharyngeal swab, presence of viral mutation(s) within the areas targeted by this assay, and inadequate number of viral copies(<138 copies/mL). A negative result must be combined with clinical observations, patient history, and epidemiological information. The expected result is Negative.  Fact Sheet for Patients:  EntrepreneurPulse.com.au  Fact Sheet for Healthcare Providers:   IncredibleEmployment.be  This test is no t yet approved or cleared by the [place] FDA and  has been authorized for detection and/or diagnosis of SARS-CoV-2 by FDA under an Emergency Use Authorization (EUA). This EUA will remain  in effect (meaning this test can be used) for the duration of the COVID-19 declaration under Section 564(b)(1) of the Act, 21 U.S.C.section 360bbb-3(b)(1), unless the authorization is terminated  or revoked sooner.       Influenza A by PCR NEGATIVE NEGATIVE Final   Influenza B by PCR NEGATIVE NEGATIVE Final    Comment: (NOTE) The Xpert Xpress SARS-CoV-2/FLU/RSV plus assay is intended as an aid in the diagnosis of influenza from Nasopharyngeal swab specimens and should not be used as a sole basis for treatment. Nasal washings and aspirates are unacceptable for Xpert Xpress SARS-CoV-2/FLU/RSV testing.  Fact Sheet for Patients: EntrepreneurPulse.com.au  Fact Sheet for Healthcare Providers: IncredibleEmployment.be  This  test is not yet approved or cleared by the [place] and has been authorized for detection and/or diagnosis of SARS-CoV-2 by FDA under an Emergency Use Authorization (EUA). This EUA will remain in effect (meaning this test can be used) for the duration of the COVID-19 declaration under Section 564(b)(1) of the Act, 21 U.S.C. section 360bbb-3(b)(1), unless the authorization is terminated or revoked.  Performed at [facility], [place] [address], [place], [place] [address]      Labs: BNP (last 3 results) No results for input(s): BNP in the last 8760 hours. Basic Metabolic Panel: Recent Labs  Lab 04/01/20 0800 04/02/20 0446 04/03/20 0313 04/04/20 0350  NA 135 138 136 134*  K 4.0 4.1 4.1 5.0  CL 107 106 105 105  CO2 23 24 24 22   GLUCOSE 138* 109* 111* 102*  BUN 13 15 14 16   CREATININE 1.34* 1.29* 1.33* 1.43*  CALCIUM 9.0 9.1 9.0 8.9  MG  --   --  1.8 1.9    Liver Function Tests: Recent Labs  Lab 04/01/20 0800  AST 18  ALT 21  ALKPHOS 69  BILITOT 0.4  PROT 6.4*  ALBUMIN 3.5   No results for input(s): LIPASE, AMYLASE in the last 168 hours. No results for input(s): AMMONIA in the last 168 hours. CBC: Recent Labs  Lab 04/01/20 0800 04/03/20 0313 04/04/20 0350  WBC 6.3 8.2 6.9  NEUTROABS 3.7  --   --   HGB 12.5 12.3 12.5  HCT 40.6 39.5 37.4  MCV 74.5* 74.1* 72.2*  PLT 164 167 191   Cardiac Enzymes: No results for input(s): CKTOTAL, CKMB, CKMBINDEX, TROPONINI in the last 168 hours. BNP: Invalid input(s): POCBNP CBG: No results for input(s): GLUCAP in the last 168 hours. D-Dimer No results for input(s): DDIMER in the last 72 hours. Hgb A1c No results for input(s): HGBA1C in the last 72 hours. Lipid Profile No results for input(s): CHOL, HDL, LDLCALC, TRIG, CHOLHDL, LDLDIRECT in the last 72 hours. Thyroid function studies Recent Labs    04/02/20 0931  TSH 1.017   Anemia work up Recent Labs    04/02/20 0931  VITAMINB12 517  FERRITIN 122  TIBC 382  IRON 83   Urinalysis    Component Value Date/Time   COLORURINE YELLOW 06/07/2015 [place] 06/07/2015 1526   LABSPEC 1.020 06/07/2015 1526   PHURINE 5.5 06/07/2015 1526   GLUCOSEU NEGATIVE 06/07/2015 [place] 07/09/2011 0743   HGBUR NEGATIVE 06/07/2015 [place] 06/07/2015 1526   BILIRUBINUR neg 09/30/2012 1438   KETONESUR NEGATIVE 06/07/2015 1526   PROTEINUR 1+ (A) 06/07/2015 1526   UROBILINOGEN negative 09/30/2012 1438   UROBILINOGEN 0.2 04/12/2012 0720   NITRITE NEGATIVE 06/07/2015 1526   LEUKOCYTESUR 3+ (A) 06/07/2015 1526   Sepsis Labs Invalid input(s): PROCALCITONIN,  WBC,  LACTICIDVEN Microbiology Recent Results (from the past 240 hour(s))  Resp Panel by RT-PCR (Flu A&B, Covid) Nasopharyngeal Swab     Status: None   Collection Time: 04/01/20  8:01 AM   Specimen: Nasopharyngeal Swab;  Nasopharyngeal(NP) swabs in vial transport medium  Result Value Ref Range Status   SARS Coronavirus 2 by RT PCR NEGATIVE NEGATIVE Final    Comment: (NOTE) SARS-CoV-2 target nucleic acids are NOT DETECTED.  The SARS-CoV-2 RNA is generally detectable in upper respiratory specimens during the acute phase of infection. The lowest concentration of SARS-CoV-2 viral copies this assay can detect is 138 copies/mL. A negative result does  not preclude SARS-Cov-2 infection and should not be used as the sole basis for treatment or other patient management decisions. A negative result may occur with  improper specimen collection/handling, submission of specimen other than nasopharyngeal swab, presence of viral mutation(s) within the areas targeted by this assay, and inadequate number of viral copies(<138 copies/mL). A negative result must be combined with clinical observations, patient history, and epidemiological information. The expected result is Negative.  Fact Sheet for Patients:  EntrepreneurPulse.com.au  Fact Sheet for Healthcare Providers:  IncredibleEmployment.be  This test is no t yet approved or cleared by the [place] FDA and  has been authorized for detection and/or diagnosis of SARS-CoV-2 by FDA under an Emergency Use Authorization (EUA). This EUA will remain  in effect (meaning this test can be used) for the duration of the COVID-19 declaration under Section 564(b)(1) of the Act, 21 U.S.C.section 360bbb-3(b)(1), unless the authorization is terminated  or revoked sooner.       Influenza A by PCR NEGATIVE NEGATIVE Final   Influenza B by PCR NEGATIVE NEGATIVE Final    Comment: (NOTE) The Xpert Xpress SARS-CoV-2/FLU/RSV plus assay is intended as an aid in the diagnosis of influenza from Nasopharyngeal swab specimens and should not be used as a sole basis for treatment. Nasal washings and aspirates are unacceptable for Xpert Xpress  SARS-CoV-2/FLU/RSV testing.  Fact Sheet for Patients: EntrepreneurPulse.com.au  Fact Sheet for Healthcare Providers: IncredibleEmployment.be  This test is not yet approved or cleared by the [place] FDA and has been authorized for detection and/or diagnosis of SARS-CoV-2 by FDA under an Emergency Use Authorization (EUA). This EUA will remain in effect (meaning this test can be used) for the duration of the COVID-19 declaration under Section 564(b)(1) of the Act, 21 U.S.C. section 360bbb-3(b)(1), unless the authorization is terminated or revoked.  Performed at [facility], [place] [address], [place], [place] [address]      Time coordinating discharge: Over 30 minutes  SIGNED:   Charlynne Cousins, MD  [facility] 04/04/2020, 11:56 AM Pager   If 7PM-7AM, please contact night-coverage [email or web address] Password [email or web address]

## 2020-04-04 NOTE — Plan of Care (Signed)
  Problem: Education: Goal: Knowledge of General Education information will improve Description: Including pain rating scale, medication(s)/side effects and non-pharmacologic comfort measures 04/04/2020 0848 by Durwin Glaze, LPN Outcome: Adequate for Discharge 04/04/2020 0848 by Durwin Glaze, LPN Outcome: Progressing   Problem: Health Behavior/Discharge Planning: Goal: Ability to manage health-related needs will improve 04/04/2020 0848 by Durwin Glaze, LPN Outcome: Adequate for Discharge 04/04/2020 0848 by Durwin Glaze, LPN Outcome: Progressing   Problem: Clinical Measurements: Goal: Ability to maintain clinical measurements within normal limits will improve 04/04/2020 0848 by Durwin Glaze, LPN Outcome: Adequate for Discharge 04/04/2020 0848 by Durwin Glaze, LPN Outcome: Progressing Goal: Will remain free from infection 04/04/2020 0848 by Durwin Glaze, LPN Outcome: Adequate for Discharge 04/04/2020 0848 by Durwin Glaze, LPN Outcome: Progressing Goal: Diagnostic test results will improve 04/04/2020 0848 by Durwin Glaze, LPN Outcome: Adequate for Discharge 04/04/2020 0848 by Durwin Glaze, LPN Outcome: Progressing Goal: Respiratory complications will improve 04/04/2020 0848 by Durwin Glaze, LPN Outcome: Adequate for Discharge 04/04/2020 0848 by Durwin Glaze, LPN Outcome: Progressing Goal: Cardiovascular complication will be avoided 04/04/2020 0848 by Durwin Glaze, LPN Outcome: Adequate for Discharge 04/04/2020 0848 by Durwin Glaze, LPN Outcome: Progressing   Problem: Clinical Measurements: Goal: Will remain free from infection 04/04/2020 0848 by Durwin Glaze, LPN Outcome: Adequate for Discharge 04/04/2020 0848 by Durwin Glaze, LPN Outcome: Progressing   Problem: Clinical Measurements: Goal: Will remain free from infection 04/04/2020 0848 by Durwin Glaze, LPN Outcome: Adequate for Discharge 04/04/2020 0848 by Durwin Glaze, LPN Outcome: Progressing   Problem: Activity: Goal: Risk for activity intolerance will decrease 04/04/2020 0848 by Durwin Glaze, LPN Outcome: Adequate for Discharge 04/04/2020 0848 by Durwin Glaze, LPN Outcome: Progressing   Problem: Nutrition: Goal: Adequate nutrition will be maintained 04/04/2020 0848 by Durwin Glaze, LPN Outcome: Adequate for Discharge 04/04/2020 0848 by Durwin Glaze, LPN Outcome: Progressing   Problem: Coping: Goal: Level of anxiety will decrease 04/04/2020 0848 by Durwin Glaze, LPN Outcome: Adequate for Discharge 04/04/2020 0848 by Durwin Glaze, LPN Outcome: Progressing   Problem: Elimination: Goal: Will not experience complications related to bowel motility 04/04/2020 0848 by Durwin Glaze, LPN Outcome: Adequate for Discharge 04/04/2020 0848 by Durwin Glaze, LPN Outcome: Progressing Goal: Will not experience complications related to urinary retention 04/04/2020 0848 by Durwin Glaze, LPN Outcome: Adequate for Discharge 04/04/2020 0848 by Durwin Glaze, LPN Outcome: Progressing   Problem: Safety: Goal: Ability to remain free from injury will improve 04/04/2020 0848 by Durwin Glaze, LPN Outcome: Adequate for Discharge 04/04/2020 0848 by Durwin Glaze, LPN Outcome: Progressing   Problem: Skin Integrity: Goal: Risk for impaired skin integrity will decrease 04/04/2020 0848 by Durwin Glaze, LPN Outcome: Adequate for Discharge 04/04/2020 0848 by Durwin Glaze, LPN Outcome: Progressing   Problem: Pain Managment: Goal: General experience of comfort will improve 04/04/2020 0848 by Durwin Glaze, LPN Outcome: Adequate for Discharge 04/04/2020 0848 by Durwin Glaze, LPN Outcome: Progressing   Problem: Skin Integrity: Goal: Risk for impaired skin integrity will decrease 04/04/2020 0848 by Durwin Glaze, LPN Outcome: Adequate for Discharge 04/04/2020 0848 by Durwin Glaze, LPN Outcome:  Progressing

## 2020-05-07 NOTE — Progress Notes (Signed)
This encounter was created in error - please disregard.

## 2020-05-08 ENCOUNTER — Encounter: Payer: Medicare Other | Admitting: Cardiology

## 2020-05-13 ENCOUNTER — Ambulatory Visit: Payer: Medicare Other | Admitting: Internal Medicine

## 2020-05-13 ENCOUNTER — Other Ambulatory Visit: Payer: Self-pay

## 2020-05-13 ENCOUNTER — Encounter: Payer: Self-pay | Admitting: Internal Medicine

## 2020-05-13 VITALS — BP 122/70 | HR 62 | Ht 63.0 in | Wt 204.0 lb

## 2020-05-13 DIAGNOSIS — R053 Chronic cough: Secondary | ICD-10-CM

## 2020-05-13 DIAGNOSIS — R0602 Shortness of breath: Secondary | ICD-10-CM

## 2020-05-13 DIAGNOSIS — Z9989 Dependence on other enabling machines and devices: Secondary | ICD-10-CM

## 2020-05-13 DIAGNOSIS — I2602 Saddle embolus of pulmonary artery with acute cor pulmonale: Secondary | ICD-10-CM | POA: Diagnosis not present

## 2020-05-13 DIAGNOSIS — G4733 Obstructive sleep apnea (adult) (pediatric): Secondary | ICD-10-CM

## 2020-05-13 MED ORDER — PANTOPRAZOLE SODIUM 40 MG PO TBEC
40.0000 mg | DELAYED_RELEASE_TABLET | Freq: Every day | ORAL | 3 refills | Status: DC
Start: 2020-05-13 — End: 2020-08-06

## 2020-05-13 NOTE — Patient Instructions (Addendum)
The patient should have follow up scheduled with myself in 2 months.   Keep taking eliquis Start taking protonix for reflux, see if it helps your cough.  Prior to next visit patient should have: Full pft - one hour prior to appointment  What is GERD? Gastroesophageal reflux disease (GERD) is gastroesophageal reflux diseasewhich occurs when the lower esophageal sphincter (LES) opens spontaneously, for varying periods of time, or does not close properly and stomach contents rise up into the esophagus. GER is also called acid reflux or acid regurgitation, because digestive juices--called acids--rise up with the food. The esophagus is the tube that carries food from the mouth to the stomach. The LES is a ring of muscle at the bottom of the esophagus that acts like a valve between the esophagus and stomach.  When acid reflux occurs, food or fluid can be tasted in the back of the mouth. When refluxed stomach acid touches the lining of the esophagus it may cause a burning sensation in the chest or throat called heartburn or acid indigestion. Occasional reflux is common. Persistent reflux that occurs more than twice a week is considered GERD, and it can eventually lead to more serious health problems. People of all ages can have GERD. Studies have shown that GERD may worsen or contribute to asthma, chronic cough, and pulmonary fibrosis.   What are the symptoms of GERD? The main symptom of GERD in adults is frequent heartburn, also called acid indigestion--burning-type pain in the lower part of the mid-chest, behind the breast bone, and in the mid-abdomen.  Not all reflux is acidic in nature, and many patients don't have heart burn at all. Sometimes it feels like a cough (either dry or with mucus), choking sensation, asthma, shortness of breath, waking up at night, frequent throat clearing, or trouble swallowing.    What causes GERD? The reason some people develop GERD is still unclear. However, research  shows that in people with GERD, the LES relaxes while the rest of the esophagus is working. Anatomical abnormalities such as a hiatal hernia may also contribute to GERD. A hiatal hernia occurs when the upper part of the stomach and the LES move above the diaphragm, the muscle wall that separates the stomach from the chest. Normally, the diaphragm helps the LES keep acid from rising up into the esophagus. When a hiatal hernia is present, acid reflux can occur more easily. A hiatal hernia can occur in people of any age and is most often a normal finding in otherwise healthy people over age 61. Most of the time, a hiatal hernia produces no symptoms.   Other factors that may contribute to GERD include - Obesity or recent weight gain - Pregnancy  - Smoking  - Diet - Certain medications  Common foods that can worsen reflux symptoms include: - carbonated beverages - artificial sweeteners - citrus fruits  - chocolate  - drinks with caffeine or alcohol  - fatty and fried foods  - garlic and onions  - mint flavorings  - spicy foods  - tomato-based foods, like spaghetti sauce, salsa, chili, and pizza   Lifestyle Changes If you smoke, stop.  Avoid foods and beverages that worsen symptoms (see above.) Lose weight if needed.  Eat small, frequent meals.  Wear loose-fitting clothes.  Avoid lying down for 3 hours after a meal.  Raise the head of your bed 6 to 8 inches by securing wood blocks under the bedposts. Just using extra pillows will not help, but using a  wedge-shaped pillow may be helpful.  Medications  H2 blockers, such as cimetidine (Tagamet HB), famotidine (Pepcid AC), nizatidine (Axid AR), and ranitidine (Zantac 75), decrease acid production. They are available in prescription strength and over-the-counter strength. These drugs provide short-term relief and are effective for about half of those who have GERD symptoms.  Proton pump inhibitors include omeprazole (Prilosec, Zegerid),  lansoprazole (Prevacid), pantoprazole (Protonix), rabeprazole (Aciphex), and esomeprazole (Nexium), which are available by prescription. Prilosec is also available in over-the-counter strength. Proton pump inhibitors are more effective than H2 blockers and can relieve symptoms and heal the esophageal lining in almost everyone who has GERD.  Because drugs work in different ways, combinations of medications may help control symptoms. People who get heartburn after eating may take both antacids and H2 blockers. The antacids work first to neutralize the acid in the stomach, and then the H2 blockers act on acid production. By the time the antacid stops working, the H2 blocker will have stopped acid production. Your health care provider is the best source of information about how to use medications for GERD.   Points to Remember 1. You can have GERD without having heartburn. Your symptoms could include a dry cough, asthma symptoms, or trouble swallowing.  2. Taking medications daily as prescribed is important in controlling you symptoms.  Sometimes it can take up to 8 weeks to fully achieve the effects of the medications prescribed.  3. Coughing related to GERD can be difficult to treat and is very frustrating!  However, it is important to stick with these medications and lifestyle modifications before pursuing more aggressive or invasive test and treatments.    [name]

## 2020-05-13 NOTE — Progress Notes (Signed)
Alexis Wheeler    [id number]    08-May-1955  Primary Care Physician:Avbuere, Christean Grief, MD Date of Appointment: 05/13/2020 Established Patient Visit  Chief complaint:   Chief Complaint  Patient presents with  . Hospitalization Follow-up    Pulmonary Embolism, was in hospital for 3 days.  Complains of mild swelling in left ankle.     HPI: Alexis Wheeler is a 65 y.o. woman who was admitted in March 2022 with an acute pulmonary embolism, submassive, unprovoked. Also with RLE DVT. Treated with eliquis   Interval Updates: Here for hospital follow up. PE and DVT unprovoked in the setting of relatively immobility. Walks less than 500 feet/day. Has history of left peroneal DVT which was being monitored off anticoagulation for many years. This DVT was in the RLE.   Has chronic cough with occasional phlegm, mostly dry. Occurs at night, morning and during the day. Going on for about a month. No chest tightness. Wheezes at rest, not with exertion. No dyspnea. No childhood respiratory disease. Never smoker. No previous pneumonia or bronchitis. Denies reflux.   Not using CPAP due to leaking and "can't get used to it."  I have reviewed the patient's family social and past medical history and updated as appropriate.   Past Medical History:  Diagnosis Date  . Anemia    childhood  . Arthritis    right knee  . Ascending aortic dissection [facility])    s/p repair by Dr. Servando Snare  . Blood transfusion without reported diagnosis    with heart surgery-repair aorta  . CAD (coronary artery disease)    a. 04/2015: cath showed 30% Ost RCA stenosis, 20% mid-LAD stenosis, and normal LV function.   . Cataract    bilateral eyes, per pt.  . Congestive dilated cardiomyopathy ([place]) 08/01/2014  . DVT (deep venous thrombosis) ([place])    right  . GERD   . HEMATURIA UNSPECIFIED   . History of kidney stones   . HYPERLIPIDEMIA   . HYPERTENSION   . Morbid obesity ([place])   . Myocardial infarction ([place])     01/30/2014  . MYOSITIS   . OSA on CPAP 05/10/2017   mild OSA with an AHI of 5.2/hr overall but severe during REM sleep with an AHI of 41.4/hr and underwent CPAP titration to 12cm H2O  . Sleep apnea    cpap-  doesnt use it- last study years ago  . Vertigo     Past Surgical History:  Procedure Laterality Date  . ABDOMINAL HYSTERECTOMY  1983  . CARDIAC CATHETERIZATION N/A 04/30/2015   Procedure: Left Heart Cath and Coronary Angiography;  Surgeon: Peter M Martinique, MD;  Location: [facility];  Service: Cardiovascular;  Laterality: N/A;  . CHOLECYSTECTOMY  2001  . CYSTOSCOPY W/ RETROGRADES Left 08/02/2015   Procedure: CYSTOSCOPY WITH LEFT RETROGRADE PYELOGRAM;  Surgeon: Festus Aloe, MD;  Location: [facility];  Service: Urology;  Laterality: Left;  . CYSTOSCOPY WITH URETEROSCOPY, STONE BASKETRY AND STENT PLACEMENT Left 08/02/2015   Procedure:  Mitzi Davenport ;  Surgeon: Festus Aloe, MD;  Location: [facility];  Service: Urology;  Laterality: Left;  . CYSTOSCOPY/URETEROSCOPY/HOLMIUM LASER/STENT PLACEMENT Left 08/02/2015   Procedure: CYSTOSCOPY/URETEROSCOPY/HOLMIUM LASER/STENT PLACEMENT-LEFT;  Surgeon: Festus Aloe, MD;  Location: [facility];  Service: Urology;  Laterality: Left;  . HOLMIUM LASER APPLICATION Left 03/05/9796   Procedure: HOLMIUM LASER APPLICATION;  Surgeon: Festus Aloe, MD;  Location: [facility];  Service: Urology;  Laterality: Left;  . REPLACEMENT ASCENDING  AORTA N/A 03/03/2014   Procedure: REPAIR OF ASCENDING AORTIC DISSECTION;  Surgeon: Grace Isaac, MD;  Location: [facility];  Service: Open Heart Surgery;  Laterality: N/A;    Family History  Problem Relation Age of Onset  . Breast cancer Mother 53  . Aortic dissection Mother 20  . Hypertension Mother   . Throat cancer Brother        1 bro living  . Colon cancer Brother   . Esophageal cancer Brother   . Hypertension Father   . Breast cancer Sister   . Hypertension Sister   . Hypertension Daughter   . Seizures Neg  Hx   . Liver cancer Neg Hx   . Pancreatic cancer Neg Hx   . Rectal cancer Neg Hx   . Stomach cancer Neg Hx     Social History   Occupational History  . Not on file  Tobacco Use  . Smoking status: Never Smoker  . Smokeless tobacco: Never Used  . 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 Vaping Use: Never used  Substance and Sexual Activity  . Alcohol use: No  . Drug use: Not Currently    Types: Marijuana  . Sexual activity: Never     Physical Exam: Blood pressure 122/70, pulse 62, height 5\' 3"  (1.6 m), weight 204 lb (92.5 kg), SpO2 99 %.  Gen:      No acute distress ENT:  no nasal polyps, mucus membranes moist Lungs:    No increased respiratory effort, symmetric chest wall excursion, clear to auscultation bilaterally, no wheezes or crackles CV:         Systolic murmur, RRR, varicose veins, trace ankle edema   Data Reviewed: Imaging: I have personally reviewed the CTPE study which shows Acute saddle PE with RV strain.   PFTs:   Labs:  Immunization status: Immunization History  Administered Date(s) Administered  . Influenza Inj Mdck Quad With Preservative 10/19/2018  . Influenza Split 10/13/2010  . Influenza,inj,Quad PF,6+ Mos 09/30/2012  . Tdap 07/02/2011    Assessment:  Acute Pulmonary Embolism Dyspnea OSA on CPAP Chronic Cough Aortic Stenosis  Plan/Recommendations: Continue Eliquis. Recommend lifelong anticoagulation as long as no contraindications based on unprovoked nature and recurrence of VTE.  Will do a trial of PPI for her cough. Suspect GERD, less likely asthma, post nasal drainage.  She is following up with cardiology later this month - may need echo to evaluate resolution of RV strain. Will order PFTs for dyspnea.   Return to Care: Return in about 2 months (around 07/13/2020).   Lenice Llamas, MD Pulmonary and [place]

## 2020-05-23 ENCOUNTER — Other Ambulatory Visit: Payer: Self-pay | Admitting: Internal Medicine

## 2020-05-24 LAB — TSH: TSH: 0.67 mIU/L (ref 0.40–4.50)

## 2020-05-24 LAB — COMPLETE METABOLIC PANEL WITH GFR
AG Ratio: 1.7 (calc) (ref 1.0–2.5)
ALT: 28 U/L (ref 6–29)
AST: 19 U/L (ref 10–35)
Albumin: 4 g/dL (ref 3.6–5.1)
Alkaline phosphatase (APISO): 82 U/L (ref 37–153)
BUN/Creatinine Ratio: 17 (calc) (ref 6–22)
BUN: 17 mg/dL (ref 7–25)
CO2: 26 mmol/L (ref 20–32)
Calcium: 9.1 mg/dL (ref 8.6–10.4)
Chloride: 104 mmol/L (ref 98–110)
Creat: 1.01 mg/dL — ABNORMAL HIGH (ref 0.50–0.99)
GFR, Est African American: 68 mL/min/{1.73_m2} (ref >=60)
GFR, Est Non African American: 58 mL/min/{1.73_m2} — ABNORMAL LOW (ref >=60)
Globulin: 2.4 g/dL (calc) (ref 1.9–3.7)
Glucose, Bld: 84 mg/dL (ref 65–99)
Potassium: 3.5 mmol/L (ref 3.5–5.3)
Sodium: 141 mmol/L (ref 135–146)
Total Bilirubin: 0.3 mg/dL (ref 0.2–1.2)
Total Protein: 6.4 g/dL (ref 6.1–8.1)

## 2020-05-24 LAB — CBC
HCT: 41.5 % (ref 35.0–45.0)
Hemoglobin: 12.6 g/dL (ref 11.7–15.5)
MCH: 22.6 pg — ABNORMAL LOW (ref 27.0–33.0)
MCHC: 30.4 g/dL — ABNORMAL LOW (ref 32.0–36.0)
MCV: 74.5 fL — ABNORMAL LOW (ref 80.0–100.0)
MPV: 10.6 fL (ref 7.5–12.5)
Platelets: 223 10*3/uL (ref 140–400)
RBC: 5.57 10*6/uL — ABNORMAL HIGH (ref 3.80–5.10)
RDW: 14.6 % (ref 11.0–15.0)
WBC: 6.9 10*3/uL (ref 3.8–10.8)

## 2020-05-24 LAB — LIPID PANEL
Cholesterol: 127 mg/dL (ref <200)
HDL: 55 mg/dL (ref >=50)
LDL Cholesterol (Calc): 54 mg/dL (calc)
Non-HDL Cholesterol (Calc): 72 mg/dL (calc) (ref <130)
Total CHOL/HDL Ratio: 2.3 (calc) (ref <5.0)
Triglycerides: 99 mg/dL (ref <150)

## 2020-05-24 LAB — VITAMIN D 25 HYDROXY (VIT D DEFICIENCY, FRACTURES): Vit D, 25-Hydroxy: 59 ng/mL (ref 30–100)

## 2020-06-04 ENCOUNTER — Telehealth: Payer: Self-pay | Admitting: Cardiology

## 2020-06-04 NOTE — Telephone Encounter (Signed)
Patient states she is returning a call from today. I did not see any notes. 

## 2020-06-04 NOTE — Telephone Encounter (Signed)
Spoke with the patient and offered her a sooner appointment. Patient unable to make time offered to her. Will keep appointment as scheduled.

## 2020-06-20 ENCOUNTER — Other Ambulatory Visit: Payer: Self-pay

## 2020-06-20 ENCOUNTER — Encounter: Payer: Self-pay | Admitting: Cardiology

## 2020-06-20 ENCOUNTER — Ambulatory Visit (INDEPENDENT_AMBULATORY_CARE_PROVIDER_SITE_OTHER): Payer: Medicare Other | Admitting: Cardiology

## 2020-06-20 VITALS — BP 132/66 | HR 58 | Ht 63.0 in | Wt 202.4 lb

## 2020-06-20 DIAGNOSIS — I7103 Dissection of thoracoabdominal aorta: Secondary | ICD-10-CM

## 2020-06-20 DIAGNOSIS — E785 Hyperlipidemia, unspecified: Secondary | ICD-10-CM

## 2020-06-20 DIAGNOSIS — I10 Essential (primary) hypertension: Secondary | ICD-10-CM | POA: Diagnosis not present

## 2020-06-20 DIAGNOSIS — I2782 Chronic pulmonary embolism: Secondary | ICD-10-CM

## 2020-06-20 DIAGNOSIS — I2510 Atherosclerotic heart disease of native coronary artery without angina pectoris: Secondary | ICD-10-CM | POA: Diagnosis not present

## 2020-06-20 DIAGNOSIS — I2602 Saddle embolus of pulmonary artery with acute cor pulmonale: Secondary | ICD-10-CM

## 2020-06-20 DIAGNOSIS — I420 Dilated cardiomyopathy: Secondary | ICD-10-CM

## 2020-06-20 MED ORDER — AMLODIPINE BESYLATE 10 MG PO TABS: 10.0000 mg | ORAL_TABLET | Freq: Every day | ORAL | 3 refills | Status: AC

## 2020-06-20 MED ORDER — ATORVASTATIN CALCIUM 40 MG PO TABS: ORAL_TABLET | ORAL | 3 refills | Status: AC

## 2020-06-20 MED ORDER — CARVEDILOL 12.5 MG PO TABS: 12.5000 mg | ORAL_TABLET | Freq: Two times a day (BID) | ORAL | 3 refills | Status: AC

## 2020-06-20 NOTE — Progress Notes (Signed)
Cardiology Office Note:    Date:  06/20/2020   ID:  ASTI MACKLEY, DOB 1955-04-04, MRN [id number]  PCP:  Nolene Ebbs, MD  Cardiologist:  Fransico Him, MD    Referring MD: Nolene Ebbs, MD   Chief Complaint  Patient presents with   Follow-up    Chronic aortic dissection, DCM, HLD, HTN, Chronic DVT/PE with cor pulmonare, CAD     History of Present Illness:    Alexis Wheeler is a 65 y.o. female with a hx of ASCAD with cath 2017 showing 30% Ost RCA stenosis, 20% mid-LAD stenosis, and normal LV function.  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  She underwent replacement of the ascending aorta with resuspension of the AV with 30 mm Hemashield Platinum graft with R axillary artery cannulation by Dr. Servando Snare.  EF was 40-45% post op.  Follow up echo from 07/2014 showed improvement of her EF - now at 55 to 60%. She also has a history of hypertension, morbid obesity and OSA on CPAP.    In 2018 she had CTA when admitted with chest pain -- possible small PE versus scarring noted - she did not wish to be on blood thinner. No DVT on doppler study. She was not short of breath and clinically did not fit the picture for a PE. Earlier in 2020 she had lower extremity dopplers due to prior elevated d dimer and has DVT in the peroneal vein on the right. This had persisted and since below the knee and no PE - did not want anticoagulation.  She was hospitalized in March 2022 with acute saddle pulmonary embolism with large burden of lobar and distal embolus and more occlusive in the right pulmonary arteries, RVE and stable chronic aortic dissection with Bentall type ascending thoracic graft repair.    She is here today for followup and is doing well but feels tired.  She denies any chest pain or pressure, SOB, DOE, PND, orthopnea, dizziness, palpitations or syncope. She has chronic LE  edema in her ankles in the afternoon.  She is compliant with her meds and is tolerating meds with no SE.     She is doing well with her CPAP device and thinks that she has gotten used to it.  She tolerates the mask and feels the pressure is adequate.  Since going on CPAP she fhas not really noticed and significant change in how she feels in the am and does have to nap some during the day.  She denies any significant mouth or nasal dryness or nasal congestion.  She does not think that he snores.     Past Medical History:  Diagnosis Date   Anemia    childhood   Arthritis    right knee   Ascending aortic dissection [facility])    s/p repair by Dr. Servando Snare   Blood transfusion without reported diagnosis    with heart surgery-repair aorta   CAD (coronary artery disease)    a. 04/2015: cath showed 30% Ost RCA stenosis, 20% mid-LAD stenosis, and normal LV function.    Cataract    bilateral eyes, per pt.   Congestive dilated cardiomyopathy ([place]) 08/01/2014   DVT (deep venous thrombosis) (HCC)    right   GERD    HEMATURIA UNSPECIFIED    History of kidney stones    HYPERLIPIDEMIA    HYPERTENSION  Morbid obesity ([place])    Myocardial infarction ([place])    01/30/2014   MYOSITIS    OSA on CPAP 05/10/2017   mild OSA with an AHI of 5.2/hr overall but severe during REM sleep with an AHI of 41.4/hr and underwent CPAP titration to 12cm H2O   Pulmonary embolism with acute cor pulmonale ([place]) 03/2020   Saddle embolism with acute cor pulmonale   Sleep apnea    cpap-  doesnt use it- last study years ago   Vertigo     Past Surgical History:  Procedure Laterality Date   [place] N/A 04/30/2015   Procedure: Left Heart Cath and Coronary Angiography;  Surgeon: Peter M Martinique, MD;  Location: [facility];  Service: Cardiovascular;  Laterality: N/A;   CHOLECYSTECTOMY  2001   CYSTOSCOPY W/ RETROGRADES Left 08/02/2015   Procedure: CYSTOSCOPY WITH LEFT  RETROGRADE PYELOGRAM;  Surgeon: Festus Aloe, MD;  [facility];  Service: Urology;  Laterality: Left;   CYSTOSCOPY WITH URETEROSCOPY, STONE BASKETRY AND STENT PLACEMENT Left 08/02/2015   Procedure:  Mitzi Davenport ;  Surgeon: Festus Aloe, MD;  [facility];  Service: Urology;  Laterality: Left;   CYSTOSCOPY/URETEROSCOPY/HOLMIUM LASER/STENT PLACEMENT Left 08/02/2015   Procedure: CYSTOSCOPY/URETEROSCOPY/HOLMIUM LASER/STENT PLACEMENT-LEFT;  Surgeon: Festus Aloe, MD;  [facility];  Service: Urology;  Laterality: Left;   HOLMIUM LASER APPLICATION Left 03/12/6008   Procedure: HOLMIUM LASER APPLICATION;  Surgeon: Festus Aloe, MD;  [facility];  Service: Urology;  Laterality: Left;   REPLACEMENT ASCENDING AORTA N/A 03/03/2014   Procedure: REPAIR OF ASCENDING AORTIC DISSECTION;  Surgeon: Grace Isaac, MD;  Location: [facility];  Service: Open Heart Surgery;  Laterality: N/A;    Current Medications: Current Meds  Medication Sig   amLODipine (NORVASC) 10 MG tablet Take 1 tablet (10 mg total) by mouth daily.   APIXABAN (ELIQUIS) VTE STARTER PACK (10MG  AND 5MG ) Take as directed on package: start with two-5mg  tablets twice daily for 7 days. On day 8, switch to one-5mg  tablet twice daily.   aspirin EC 81 MG EC tablet Take 1 tablet (81 mg total) by mouth daily.   atorvastatin (LIPITOR) 40 MG tablet TAKE 1 TABLET BY MOUTH ONCE DAILY AT 6:00 IN THE  EVENING   carvedilol (COREG) 12.5 MG tablet Take 1 tablet by mouth twice daily   diclofenac Sodium (VOLTAREN) 1 % GEL Apply 2-4 g topically 4 (four) times daily as needed (pain).   ferrous sulfate 325 (65 FE) MG tablet Take 1 tablet (325 mg total) by mouth daily with breakfast.   folic acid (FOLVITE) 1 MG tablet Take 1 mg by mouth daily.   pantoprazole (PROTONIX) 40 MG tablet Take 1 tablet (40 mg total) by mouth daily.   Vitamin D, Ergocalciferol, (DRISDOL) 1.25 MG (50000 UNIT) CAPS capsule Take 50,000 Units by mouth once a week.      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  Vaping Use  Vaping Use: Never used  Substance and Sexual Activity   Alcohol use: No   Drug use: Not Currently    Types: Marijuana   Sexual activity: Never  Other Topics Concern   Not on file  Social History Narrative   Not on file   Social Determinants of Health   Financial Resource Strain: Not on file  Food Insecurity: Not on file  Transportation Needs: Not on file  Physical Activity: Not on file  Stress: Not on file  Social Connections: Not on file     Family History: The patient's family history includes Aortic dissection (age of onset: 18) in her mother; Breast cancer in her sister; Breast cancer (age of onset: 31) in her mother; Colon cancer in her brother; Esophageal cancer in her brother; Hypertension in her daughter, father, mother, and sister; Throat cancer in her brother. There is no history of Seizures, Liver cancer, Pancreatic cancer, Rectal cancer, or Stomach cancer.  ROS:   Please see the history of present illness.    ROS  All other systems reviewed and negative.   EKGs/Labs/Other Studies Reviewed:    The following studies were reviewed today: Ekg and PAP download  EKG:  EKG is not ordered today  Recent Labs: 04/04/2020: Magnesium 1.9 05/23/2020: ALT 28; BUN 17; Creat 1.01; Hemoglobin 12.6; Platelets 223; Potassium 3.5; Sodium 141; TSH 0.67   Recent Lipid Panel    Component Value Date/Time   CHOL 127 05/23/2020 0000   CHOL 146 09/06/2018 1109   TRIG 99 05/23/2020 0000   HDL 55  05/23/2020 0000   HDL 53 09/06/2018 1109   CHOLHDL 2.3 05/23/2020 0000   VLDL 27 12/24/2015 1059   LDLCALC 54 05/23/2020 0000    Physical Exam:    VS:  BP 132/66   Pulse (!) 58   Ht 5\' 3"  (1.6 m)   Wt 202 lb 6.4 oz (91.8 kg)   SpO2 98%   BMI 35.85 kg/m     Wt Readings from Last 3 Encounters:  06/20/20 202 lb 6.4 oz (91.8 kg)  05/13/20 204 lb (92.5 kg)  04/01/20 201 lb 1 oz (91.2 kg)     GEN: Well nourished, well developed in no acute distress HEENT: Normal NECK: No JVD; No carotid bruits LYMPHATICS: No lymphadenopathy CARDIAC:RRR, no murmurs, rubs, gallops RESPIRATORY:  Clear to auscultation without rales, wheezing or rhonchi  ABDOMEN: Soft, non-tender, non-distended MUSCULOSKELETAL:  No edema; No deformity  SKIN: Warm and dry NEUROLOGIC:  Alert and oriented x 3 PSYCHIATRIC:  Normal affect   ASSESSMENT:    1. Coronary artery disease involving native coronary artery of native heart without angina pectoris   2. Benign essential HTN   3. Dilated cardiomyopathy ([place])   4. Hyperlipidemia with target LDL less than 70   5. Thoracoabdominal aortic dissection ([place])   6. 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   -she denies any anginal symptoms -Continue prescription drug management with ASA 81mg  daily, Carvedilol 12.5mg  BID and Atorvastatin 40mg  daily>>refilled for 1 year  Hypertension  -BP is well controlled on exam today -Continue prescription drug management with Carvedilol 12.5mg  BID, Amlodipine 10mg  daily>>refilled for 1 year   Dilated cardiomyopathy  -resolved on echo 08/01/2014 with normal LV function.  Hyperlipidemia with LDL  -LDL goal < 70 -I have personally  reviewed and interpreted outside labs performed by patient's PCP which showed LDL 54, HDL 55, ALT 28 in May 2022 -Continue  prescription drug management with Atorvastatin 40mg  daily   Ascending aortic dissection  -s/p repair and followed by CVTS - Bp well controlled  Chronic DVT/s/p saddle pulmonary embolism March 2022 -I have personally reviewed and interpreted outside 2D echo performed by hospitalist at [facility] in setting of acute PE which showed RVE  and severe RV dysfunction c/w acute cor pulmonary due to PE -now on lifelong anticoagulation with Eliquis 5mg  BID -she denies any bleeding problems.  -repeat echo to make sure RVF has improved -continue Eliquis per PCP  OSA - The patient is tolerating PAP therapy well without any problems. The PAP download performed by his DME was personally reviewed and interpreted by me today and showed an AHI of 4.3/hr on 12 cm H2O with 0% compliance in using more than 4 hours nightly.  The patient has been using and benefiting from PAP use and will continue to benefit from therapy.  -she says that the machine is wistleing and she has to take there mask off -I have encouraged her to be more compliant with her device    Medication Adjustments/Labs and Tests Ordered: Current medicines are reviewed at length with the patient today.  Concerns regarding medicines are outlined above.  No orders of the defined types were placed in this encounter.  No orders of the defined types were placed in this encounter.   Signed, Fransico Him, MD  06/20/2020 10:07 AM    [place]

## 2020-06-20 NOTE — Addendum Note (Signed)
Addended by: Antonieta Iba on: 06/20/2020 10:15 AM   Modules accepted: Orders

## 2020-06-20 NOTE — Patient Instructions (Signed)

## 2020-06-21 ENCOUNTER — Encounter (HOSPITAL_COMMUNITY): Payer: Self-pay | Admitting: Emergency Medicine

## 2020-06-21 ENCOUNTER — Other Ambulatory Visit: Payer: Self-pay

## 2020-06-21 ENCOUNTER — Emergency Department (HOSPITAL_COMMUNITY)
Admission: EM | Admit: 2020-06-21 | Discharge: 2020-06-21 | Disposition: A | Discharge status: Home / Self Care | Payer: Medicare Other | Attending: Emergency Medicine | Admitting: Emergency Medicine

## 2020-06-21 ENCOUNTER — Emergency Department (HOSPITAL_COMMUNITY): Payer: Medicare Other

## 2020-06-21 DIAGNOSIS — Z7982 Long term (current) use of aspirin: Secondary | ICD-10-CM | POA: Insufficient documentation

## 2020-06-21 DIAGNOSIS — I2510 Atherosclerotic heart disease of native coronary artery without angina pectoris: Secondary | ICD-10-CM | POA: Insufficient documentation

## 2020-06-21 DIAGNOSIS — M79605 Pain in left leg: Secondary | ICD-10-CM | POA: Diagnosis not present

## 2020-06-21 DIAGNOSIS — Z7901 Long term (current) use of anticoagulants: Secondary | ICD-10-CM | POA: Insufficient documentation

## 2020-06-21 DIAGNOSIS — I119 Hypertensive heart disease without heart failure: Secondary | ICD-10-CM | POA: Insufficient documentation

## 2020-06-21 DIAGNOSIS — Z79899 Other long term (current) drug therapy: Secondary | ICD-10-CM | POA: Diagnosis not present

## 2020-06-21 LAB — CBC
HCT: 37.7 % (ref 36.0–46.0)
Hemoglobin: 11.5 g/dL — ABNORMAL LOW (ref 12.0–15.0)
MCH: 22.8 pg — ABNORMAL LOW (ref 26.0–34.0)
MCHC: 30.5 g/dL (ref 30.0–36.0)
MCV: 74.8 fL — ABNORMAL LOW (ref 80.0–100.0)
Platelets: 211 10*3/uL (ref 150–400)
RBC: 5.04 MIL/uL (ref 3.87–5.11)
RDW: 15.1 % (ref 11.5–15.5)
WBC: 6 10*3/uL (ref 4.0–10.5)
nRBC: 0 % (ref 0.0–0.2)

## 2020-06-21 LAB — BASIC METABOLIC PANEL
Anion gap: 8 (ref 5–15)
BUN: 15 mg/dL (ref 8–23)
CO2: 28 mmol/L (ref 22–32)
Calcium: 8.9 mg/dL (ref 8.9–10.3)
Chloride: 102 mmol/L (ref 98–111)
Creatinine, Ser: 1.11 mg/dL — ABNORMAL HIGH (ref 0.44–1.00)
GFR, Estimated: 55 mL/min — ABNORMAL LOW (ref >60)
Glucose, Bld: 142 mg/dL — ABNORMAL HIGH (ref 70–99)
Potassium: 2.8 mmol/L — ABNORMAL LOW (ref 3.5–5.1)
Sodium: 138 mmol/L (ref 135–145)

## 2020-06-21 LAB — PROTIME-INR
INR: 1.1 (ref 0.8–1.2)
Prothrombin Time: 13.7 seconds (ref 11.4–15.2)

## 2020-06-21 LAB — TROPONIN I (HIGH SENSITIVITY): Troponin I (High Sensitivity): 7 ng/L (ref <18)

## 2020-06-21 MED ORDER — POTASSIUM CHLORIDE CRYS ER 20 MEQ PO TBCR: 40.0000 meq | EXTENDED_RELEASE_TABLET | Freq: Every day | ORAL | 0 refills | Status: DC

## 2020-06-21 MED ORDER — CYCLOBENZAPRINE HCL 10 MG PO TABS: 10.0000 mg | ORAL_TABLET | Freq: Three times a day (TID) | ORAL | 0 refills | Status: DC | PRN

## 2020-06-21 MED ORDER — POTASSIUM CHLORIDE CRYS ER 20 MEQ PO TBCR
40.0000 meq | EXTENDED_RELEASE_TABLET | Freq: Once | ORAL | Status: AC
  Administered 2020-06-21: 40 meq via ORAL
  Filled 2020-06-21: qty 2

## 2020-06-21 NOTE — ED Provider Notes (Signed)
[facility] EMERGENCY DEPARTMENT Provider Note   CSN: [id number] Arrival date & time: 06/21/20  0319     History Chief Complaint  Patient presents with   Leg Pain   Shortness of Breath    Alexis KESLING is a 65 y.o. female.  Patient with past medical history notable for DVT and PE on Eliquis presents to the emergency department with chief complaint of left leg pain.  She states that the symptoms started after she woke up this morning.  She states that she was bending over the sink to wash her hair and noticed the pain on the front side of her left leg.  She denies any numbness, but reports having some weakness because of the pain.  She has not taken anything for symptoms.  She states that she is concerned she might have a blood clot in her leg.  She has been compliant with taking her Eliquis.  She denies any chest pain.  Denies fever, chills, cough.  Denies any leg swelling or redness.  Denies any trauma, falls, or other injuries to the leg.  The history is provided by the patient. No language interpreter was used.      Past Medical History:  Diagnosis Date   Anemia    childhood   Arthritis    right knee   Ascending aortic dissection [facility])    s/p repair by Dr. Servando Snare   Blood transfusion without reported diagnosis    with heart surgery-repair aorta   CAD (coronary artery disease)    a. 04/2015: cath showed 30% Ost RCA stenosis, 20% mid-LAD stenosis, and normal LV function.    Cataract    bilateral eyes, per pt.   Congestive dilated cardiomyopathy ([place]) 08/01/2014   DVT (deep venous thrombosis) (HCC)    right   GERD    HEMATURIA UNSPECIFIED    History of kidney stones    HYPERLIPIDEMIA    HYPERTENSION    Morbid obesity ([place])    Myocardial infarction ([place])    01/30/2014   MYOSITIS    OSA on CPAP 05/10/2017   mild OSA with an AHI of 5.2/hr overall but severe during REM sleep with an AHI of 41.4/hr and underwent CPAP titration to 12cm H2O    Pulmonary embolism with acute cor pulmonale ([place]) 03/2020   Saddle embolism with acute cor pulmonale   Sleep apnea    cpap-  doesnt use it- last study years ago   Vertigo     Patient Active Problem List   Diagnosis Date Noted   Acute pulmonary embolism ([place]) 04/01/2020   Saddle embolus of pulmonary artery (HCC)    Dyspnea    Irritable bowel syndrome 05/23/2019   Ascending aortic dissection (HCC)    OSA on CPAP 05/10/2017   Benign essential HTN 05/10/2017   Coronary artery disease 85/46/2703   Diastolic dysfunction without heart failure 03/08/2016   Chest pain 03/07/2016   Fatigue 04/30/2015   Hypokalemia 12/20/2014   Thoracoabdominal aortic dissection ([place]) 08/01/2014   Dilated cardiomyopathy ([place]) 08/01/2014   Plantar fasciitis of left foot 05/02/2014   Non-compliant behavior 03/27/2014   S/P aortic dissection repair 03/03/2014   Lumbosacral spondylosis without myelopathy 01/26/2014   Greater trochanteric bursitis of right hip 10/10/2013   Chronic low back pain 09/26/2013   Chronic right hip pain 09/26/2013   Allergic rhinitis 08/29/2012   Kidney stone 12/31/2010   Morbid obesity ([place]) 12/18/2009   GERD 12/18/2009   HEMATURIA UNSPECIFIED 12/17/2009   Hyperlipidemia  with target LDL less than 70 12/16/2009   Hypertensive heart disease 12/16/2009    Past Surgical History:  Procedure Laterality Date   ABDOMINAL HYSTERECTOMY  1983   CARDIAC CATHETERIZATION N/A 04/30/2015   Procedure: Left Heart Cath and Coronary Angiography;  Surgeon: Peter M Martinique, MD;  Location: [facility];  Service: Cardiovascular;  Laterality: N/A;   CHOLECYSTECTOMY  2001   CYSTOSCOPY W/ RETROGRADES Left 08/02/2015   Procedure: CYSTOSCOPY WITH LEFT RETROGRADE PYELOGRAM;  Surgeon: Festus Aloe, MD;  [facility];  Service: Urology;  Laterality: Left;   CYSTOSCOPY WITH URETEROSCOPY, STONE BASKETRY AND STENT PLACEMENT Left 08/02/2015   Procedure:  Mitzi Davenport ;  Surgeon: Festus Aloe,  MD;  [facility];  Service: Urology;  Laterality: Left;   CYSTOSCOPY/URETEROSCOPY/HOLMIUM LASER/STENT PLACEMENT Left 08/02/2015   Procedure: CYSTOSCOPY/URETEROSCOPY/HOLMIUM LASER/STENT PLACEMENT-LEFT;  Surgeon: Festus Aloe, MD;  [facility];  Service: Urology;  Laterality: Left;   HOLMIUM LASER APPLICATION Left 04/28/3843   Procedure: HOLMIUM LASER APPLICATION;  Surgeon: Festus Aloe, MD;  [facility];  Service: Urology;  Laterality: Left;   REPLACEMENT ASCENDING AORTA N/A 03/03/2014   Procedure: REPAIR OF ASCENDING AORTIC DISSECTION;  Surgeon: Grace Isaac, MD;  Location: [facility];  Service: Open Heart Surgery;  Laterality: N/A;     OB History   No obstetric history on file.     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 Authorizing Provider  cyclobenzaprine (FLEXERIL) 10 MG tablet Take 1 tablet (10 mg total) by mouth 3 (three) times daily as needed for muscle spasms. 06/21/20  Yes Montine Circle, PA-C  potassium chloride SA (KLOR-CON) 20 MEQ tablet Take 2 tablets (40 mEq total) by mouth daily. 06/21/20  Yes Montine Circle, PA-C   amLODipine (NORVASC) 10 MG tablet Take 1 tablet (10 mg total) by mouth daily. 06/20/20   Sueanne Margarita, MD  APIXABAN [place] [place]) VTE STARTER PACK (10MG  AND 5MG ) Take as directed on package: start with two-5mg  tablets twice daily for 7 days. On day 8, switch to one-5mg  tablet twice daily. 04/04/20   Charlynne Cousins, MD  aspirin EC 81 MG EC tablet Take 1 tablet (81 mg total) by mouth daily. 03/14/14   Collins, Gina L, PA-C  atorvastatin (LIPITOR) 40 MG tablet TAKE 1 TABLET BY MOUTH ONCE DAILY AT 6:00 IN THE  EVENING 06/20/20   Turner, Eber Hong,  MD  carvedilol (COREG) 12.5 MG tablet Take 1 tablet (12.5 mg total) by mouth 2 (two) times daily. 06/20/20   Sueanne Margarita, MD  diclofenac Sodium (VOLTAREN) 1 % GEL Apply 2-4 g topically 4 (four) times daily as needed (pain). 12/26/18   [provider]  ferrous sulfate 325 (65 FE) MG tablet Take 1 tablet (325 mg total) by mouth daily with breakfast. 04/04/20   Charlynne Cousins, MD  folic acid (FOLVITE) 1 MG tablet Take 1 mg by mouth daily.    [provider]  pantoprazole (PROTONIX) 40 MG tablet Take 1 tablet (40 mg total) by mouth daily. 05/13/20   Spero Geralds, MD  Vitamin D, Ergocalciferol, (DRISDOL) 1.25 MG (50000 UNIT) CAPS capsule Take 50,000 Units by mouth once a week. 03/28/20   [provider]    Allergies    Hydralazine and Motrin [ibuprofen]  Review of Systems   Review of Systems  All other systems reviewed and are negative.  Physical Exam Updated Vital Signs BP (!) 146/107 (BP Location: Right Arm)   Pulse (!) 53   Temp 98.7 F (37.1 C) (Oral)   Resp 16   Ht 5\' 3"  (1.6 m)   Wt 91.2 kg   SpO2 100%   BMI 35.61 kg/m   Physical Exam Vitals and nursing note reviewed.  Constitutional:      General: She is not in acute distress.    Appearance: She is well-developed.  HENT:     Head: Normocephalic and atraumatic.  Eyes:     Conjunctiva/sclera: Conjunctivae normal.  Cardiovascular:     Rate and Rhythm:  Normal rate and regular rhythm.     Heart sounds: No murmur heard.    Comments: Intact distal pulses with brisk capillary refill Pulmonary:     Effort: Pulmonary effort is normal. No respiratory distress.     Breath sounds: Normal breath sounds.  Abdominal:     Palpations: Abdomen is soft.     Tenderness: There is no abdominal tenderness.  Musculoskeletal:        General: Normal range of motion.     Cervical back: Neck supple.     Comments: Normal range of motion of bilateral lower extremities, no bony abnormality or deformity, bilateral calfs are soft and nontender, normal great toe extension, dorsiflexion, plantarflexion. There is some reproducible pain with internal rotation of the left hip  Skin:    General: Skin is warm and dry.     Comments: No rash, no redness, no sign of abscess  Neurological:     Mental Status: She is alert and oriented to person, place, and time.  Psychiatric:        Mood and Affect: Mood normal.        Behavior: Behavior normal.    ED Results / Procedures / Treatments   Labs (all labs ordered are listed, but only abnormal results are displayed) Labs Reviewed  BASIC METABOLIC PANEL - Abnormal; Notable for the following components:      Result Value   Potassium 2.8 (*)    Glucose, Bld 142 (*)    Creatinine, Ser 1.11 (*)    GFR, Estimated 55 (*)    All other components within normal limits  CBC - Abnormal; Notable for the following components:   Hemoglobin 11.5 (*)    MCV 74.8 (*)    MCH 22.8 (*)    All other components within normal limits  PROTIME-INR  TROPONIN I (HIGH SENSITIVITY)  TROPONIN I (  HIGH SENSITIVITY)    EKG None  Radiology DG Chest 2 View  Result Date: 06/21/2020 CLINICAL DATA:  Shortness of breath EXAM: CHEST - 2 VIEW COMPARISON:  04/01/2020 FINDINGS: Cardiomegaly and aortic tortuosity similar to prior. There has been a treated thoracic aortic dissection. There is no edema, consolidation, effusion, or pneumothorax. No acute  osseous finding. Artifact from EKG leads. IMPRESSION: Stable exam.  No acute finding Electronically Signed   By: Monte Fantasia M.D.   On: 06/21/2020 04:14    Procedures Procedures   Medications Ordered in ED Medications  potassium chloride SA (KLOR-CON) CR tablet 40 mEq (40 mEq Oral Given 06/21/20 5573)    ED Course  I have reviewed the triage vital signs and the nursing notes.  Pertinent labs & imaging results that were available during my care of the patient were reviewed by me and considered in my medical decision making (see chart for details).    MDM Rules/Calculators/A&P                         Patient here with anterior left leg pain.  The symptoms started after she was washing her hair today, bent over her sink.  She denies any other potential traumatic injuries.  She does have history of PE and DVT and is taking Eliquis.  She has been compliant with this medication.  She is not hypoxic, nor tachycardic.  She does not have any chest pain.  She is afebrile.  I doubt PE or DVT.  There is no leg swelling, no redness.  The symptoms seem more radicular in nature.  Could also be from arthritis in the hip.  I think that she likely exacerbated the pain and symptoms when she was bent over the sink washing her hair.  We will trial a muscle relaxer.  Offered steroid, but patient declines.  Laboratory work-up is reassuring.  However, her potassium was slightly low, will give potassium supplementation in the ED and for home for the next couple of days.  Initial troponin is 7, I do not think that she has ACS.  She has no chest pain.  At this time, I did not feel the patient requires any further emergent work-up.  I feel that she is stable and safe for discharge home.  She and her daughter are agreeable with this plan. Final Clinical Impression(s) / ED Diagnoses Final diagnoses:  Left leg pain    Rx / DC Orders ED Discharge Orders          Ordered    potassium chloride SA (KLOR-CON) 20  MEQ tablet  Daily        06/21/20 0517    cyclobenzaprine (FLEXERIL) 10 MG tablet  3 times daily PRN        06/21/20 0517             Montine Circle, PA-C 06/21/20 0525    Maudie Flakes, MD 06/21/20 [phone]

## 2020-06-21 NOTE — ED Triage Notes (Signed)
Pt states having sharp pain left lower leg. Has since improved. Concerned for blood clots. HX DVT/PE. States taking eliquis. Pulses equal, sensation intact.

## 2020-06-21 NOTE — ED Triage Notes (Signed)
Upon further assessment pt state "just a little SOB." Starting today. Denies CP.

## 2020-06-21 NOTE — Discharge Instructions (Addendum)
Continue taking your Eliquis.  I do not believe your symptoms to be related to blood clot today.  There is no evidence of infection on your leg.  Your laboratory work-up looked good except for your potassium was a little low.  I have prescribed you some potassium to take over the next couple of days.  Please follow-up with your regular doctor.  If your symptoms change or worsen, please return to the emergency department.

## 2020-07-12 ENCOUNTER — Ambulatory Visit: Payer: Medicare Other | Admitting: Internal Medicine

## 2020-07-22 ENCOUNTER — Other Ambulatory Visit: Payer: Self-pay | Admitting: Internal Medicine

## 2020-07-25 ENCOUNTER — Encounter: Payer: Self-pay | Admitting: Cardiology

## 2020-07-25 ENCOUNTER — Other Ambulatory Visit: Payer: Self-pay

## 2020-07-25 ENCOUNTER — Ambulatory Visit (HOSPITAL_COMMUNITY): Payer: Medicare Other | Attending: Cardiology

## 2020-07-25 DIAGNOSIS — I7103 Dissection of thoracoabdominal aorta: Secondary | ICD-10-CM | POA: Diagnosis not present

## 2020-07-25 DIAGNOSIS — I351 Nonrheumatic aortic (valve) insufficiency: Secondary | ICD-10-CM | POA: Insufficient documentation

## 2020-07-25 DIAGNOSIS — I420 Dilated cardiomyopathy: Secondary | ICD-10-CM | POA: Diagnosis not present

## 2020-07-25 LAB — ECHOCARDIOGRAM COMPLETE
Area-P 1/2: 3.7 cm2
P 1/2 time: 359 msec
S' Lateral: 3.2 cm

## 2020-07-30 ENCOUNTER — Ambulatory Visit: Payer: Medicare Other | Admitting: Cardiology

## 2020-08-05 ENCOUNTER — Telehealth: Payer: Self-pay | Admitting: *Deleted

## 2020-08-05 NOTE — Telephone Encounter (Signed)
Called and spoke with patient, she uses [facility] for her CPAP machine and states she is wearing her CPAP machine.  Advised I will call them to get access to her download information for her appointment tomorrow afternoon.  I inquired about her PFT test, she states she had not had that done and no one schedule one for her.  I offered to schedule one for her tomorrow am and she was find doing that.  Scheduled her for a 9 am PFT and instructed to arrive by 8:45 am for check in.  Advised of the following as well prior to the test: No bronchodilator medication for four hours No smoking for four hours before the test No heavy meals No caffeine Do not wear any tight clothing The complete pulmonary function test takes around one and a half hours Please arrive at least 15 minutes before your appointment.  She will see Dr. Shearon Stalls at 2:15 pm.

## 2020-08-06 ENCOUNTER — Ambulatory Visit: Payer: Medicare Other | Admitting: Internal Medicine

## 2020-08-06 ENCOUNTER — Ambulatory Visit (INDEPENDENT_AMBULATORY_CARE_PROVIDER_SITE_OTHER): Payer: Medicare Other | Admitting: Internal Medicine

## 2020-08-06 ENCOUNTER — Encounter: Payer: Self-pay | Admitting: Internal Medicine

## 2020-08-06 ENCOUNTER — Other Ambulatory Visit: Payer: Self-pay

## 2020-08-06 VITALS — BP 128/62 | HR 64 | Temp 99.0°F | Ht 63.0 in | Wt 203.2 lb

## 2020-08-06 DIAGNOSIS — Z9989 Dependence on other enabling machines and devices: Secondary | ICD-10-CM

## 2020-08-06 DIAGNOSIS — R0602 Shortness of breath: Secondary | ICD-10-CM

## 2020-08-06 DIAGNOSIS — G4733 Obstructive sleep apnea (adult) (pediatric): Secondary | ICD-10-CM | POA: Diagnosis not present

## 2020-08-06 DIAGNOSIS — I2602 Saddle embolus of pulmonary artery with acute cor pulmonale: Secondary | ICD-10-CM

## 2020-08-06 LAB — PULMONARY FUNCTION TEST
DL/VA % pred: 115 %
DL/VA: 4.86 ml/min/mmHg/L
DLCO cor % pred: 86 %
DLCO cor: 16.58 ml/min/mmHg
DLCO unc % pred: 81 %
DLCO unc: 15.53 ml/min/mmHg
FEF 25-75 Post: 2.59 L/sec
FEF 25-75 Pre: 2.53 L/sec
FEF2575-%Change-Post: 2 %
FEF2575-%Pred-Post: 143 %
FEF2575-%Pred-Pre: 139 %
FEV1-%Change-Post: 0 %
FEV1-%Pred-Post: 101 %
FEV1-%Pred-Pre: 100 %
FEV1-Post: 1.9 L
FEV1-Pre: 1.89 L
FEV1FVC-%Change-Post: 0 %
FEV1FVC-%Pred-Pre: 110 %
FEV6-%Change-Post: 0 %
FEV6-%Pred-Post: 93 %
FEV6-%Pred-Pre: 93 %
FEV6-Post: 2.17 L
FEV6-Pre: 2.17 L
FEV6FVC-%Pred-Post: 103 %
FEV6FVC-%Pred-Pre: 103 %
FVC-%Change-Post: 0 %
FVC-%Pred-Post: 90 %
FVC-%Pred-Pre: 90 %
FVC-Post: 2.17 L
FVC-Pre: 2.17 L
Post FEV1/FVC ratio: 88 %
Post FEV6/FVC ratio: 100 %
Pre FEV1/FVC ratio: 87 %
Pre FEV6/FVC Ratio: 100 %
RV % pred: 86 %
RV: 1.77 L
TLC % pred: 76 %
TLC: 3.77 L

## 2020-08-06 NOTE — Progress Notes (Signed)
Alexis Wheeler    OK:[id number]    [birth year]  Primary Care Physician:Avbuere, Christean Grief, MD Date of Appointment: 08/06/2020 Established Patient Visit  Chief complaint:   Chief Complaint  Patient presents with   Follow-up    Pt states she has been doing okay since last visit. States her breathing has been doing okay.     HPI: Alexis Wheeler is a 65 y.o. woman who was admitted in March 2022 with an acute pulmonary embolism, submassive, unprovoked. Also with RLE DVT. Treated with eliquis. Also with OSA and GERD.    Interval Updates: Still on eliquis. No issues with bleeding. Coughing has gotten better. She never started the protonix due to concern for side effects.   She is doing ok with her breathing.   DME - [facility] in [place]  I reviewed her sleep download - she is using about 60% adherence, she does have some leak but her AHI is adequately suppressed with use and she does feel more refreshed the next day when she usees CPAP.   I have reviewed the patient's family social and past medical history and updated as appropriate.   Past Medical History:  Diagnosis Date   Anemia    childhood   Aortic insufficiency    mild to moderate by echo 07/2020   Arthritis    right knee   Ascending aortic dissection [facility])    s/p repair by Dr. Servando Snare.  2D echo 07/2020 with aortic root 21m   Blood transfusion without reported diagnosis    with heart surgery-repair aorta   CAD (coronary artery disease)    a. 04/2015: cath showed 30% Ost RCA stenosis, 20% mid-LAD stenosis, and normal LV function.    Cataract    bilateral eyes, per pt.   Congestive dilated cardiomyopathy ([facility] 08/01/2014   DVT (deep venous thrombosis) (HCC)    right   GERD    HEMATURIA UNSPECIFIED    History of kidney stones    HYPERLIPIDEMIA    HYPERTENSION    Morbid obesity ([facility]    Myocardial infarction ([facility]    01/30/2014   MYOSITIS    OSA on CPAP 05/10/2017   mild OSA with an AHI of 5.2/hr overall  but severe during REM sleep with an AHI of 41.4/hr and underwent CPAP titration to 12cm H2O   Pulmonary embolism with acute cor pulmonale ([facility] 03/2020   Saddle embolism with acute cor pulmonale   Sleep apnea    cpap-  doesnt use it- last study years ago   Vertigo     Past Surgical History:  Procedure Laterality Date   APocolaN/A 04/30/2015   Procedure: Left Heart Cath and Coronary Angiography;  Surgeon: Peter M JMartinique MD;  [facility];  Service: Cardiovascular;  Laterality: N/A;   CHOLECYSTECTOMY  2001   CYSTOSCOPY W/ RETROGRADES Left 08/02/2015   Procedure: CYSTOSCOPY WITH LEFT RETROGRADE PYELOGRAM;  Surgeon: MFestus Aloe MD;  Location: [facility];  Service: Urology;  Laterality: Left;   CYSTOSCOPY WITH URETEROSCOPY, STONE BASKETRY AND STENT PLACEMENT Left 08/02/2015   Procedure:  SMitzi Davenport;  Surgeon: MFestus Aloe MD;  Location: [facility];  Service: Urology;  Laterality: Left;   CYSTOSCOPY/URETEROSCOPY/HOLMIUM LASER/STENT PLACEMENT Left 08/02/2015   Procedure: CYSTOSCOPY/URETEROSCOPY/HOLMIUM LASER/STENT PLACEMENT-LEFT;  Surgeon: MFestus Aloe MD;  Location: [facility];  Service: Urology;  Laterality: Left;   HOLMIUM LASER APPLICATION Left [id number]  Procedure: HOLMIUM LASER  APPLICATION;  Surgeon: Festus Aloe, MD;  Location: [facility];  Service: Urology;  Laterality: Left;   REPLACEMENT ASCENDING AORTA N/A 03/03/2014   Procedure: REPAIR OF ASCENDING AORTIC DISSECTION;  Surgeon: Grace Isaac, MD;  Location: [facility];  Service: Open Heart Surgery;  Laterality: N/A;    Family History  Problem Relation Age of Onset   Breast cancer Mother 38   Aortic dissection Mother 18   Hypertension Mother    Throat cancer Brother        88 bro living   Colon cancer Brother    Esophageal cancer Brother    Hypertension Father    Breast cancer Sister    Hypertension Sister    Hypertension Daughter    Seizures Neg Hx     Liver cancer Neg Hx    Pancreatic cancer Neg Hx    Rectal cancer Neg Hx    Stomach cancer Neg Hx     Social History   Occupational History   Not on file  Tobacco Use   Smoking status: Never   Smokeless tobacco: Never   Tobacco comments:    separated spring 2013- lives with 1 dtr -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  Gen:      No acute distress ENT:  no nasal polyps, mucus membranes moist Lungs:    No increased respiratory effort, symmetric chest wall excursion, clear to auscultation bilaterally, no wheezes or crackles CV:         soft systolic and diastolic murmur, no edema   Data Reviewed: Imaging: I have personally reviewed the CTPE study which shows Acute saddle PE with RV strain.   PFTs: Pfts done 7/26 show mild restriction to ventilation secondary to body habitus.    Echo July 2022    1. Left ventricular ejection fraction, by estimation, is 60 to 65%. Left  ventricular ejection fraction by 3D volume is 61 %. The left ventricle has  normal function. The left ventricle has no regional wall motion  abnormalities. Left ventricular diastolic   parameters are consistent with Grade I diastolic dysfunction (impaired  relaxation). The average left ventricular global longitudinal strain is  -21.8 %. The global longitudinal strain is normal.   2. Right ventricular systolic function is normal. The right ventricular  size is normal. There is normal pulmonary artery systolic pressure. The  estimated right ventricular systolic pressure is 123456 mmHg.   3. The mitral valve is normal in structure. Trivial mitral valve   regurgitation. No evidence of mitral stenosis.   4. The aortic valve is tricuspid. Aortic valve regurgitation is moderate.  Mild to moderate aortic valve sclerosis/calcification is present, without  any evidence of aortic stenosis. Aortic regurgitation PHT measures 359  msec.   5. Aortic dilatation noted. There is mild dilatation of the aortic root,  measuring 41 mm.   6. The inferior vena cava is normal in size with greater than 50%  respiratory variability, suggesting right atrial pressure of 3 mmHg.   Comparison(s): 04/01/20 EF 50-55%.  Labs:  Immunization status: Immunization History  Administered Date(s)  Administered   Influenza Inj Mdck Quad With Preservative 10/19/2018   Influenza Split 10/13/2010   Influenza,inj,Quad PF,6+ Mos 09/30/2012   PFIZER(Purple Top)SARS-COV-2 Vaccination 04/21/2019, 05/12/2019, 01/03/2020   Tdap 07/02/2011    Assessment:  Acute Pulmonary Embolism, unprovoked, recurrent Dyspnea OSA on CPAP Chronic Cough, improved spontaneously Aortic Valve disease - moderate regurgitation, no stenosis  Plan/Recommendations: Continue Eliquis for VTE. Recommend lifelong anticoagulation as long as no contraindications based on unprovoked nature and recurrence of VTE.  Cough improved off therapy for now. Suspect GERD Discussed OSA - encouraged adherence to therapy, she is showing good improvement on the nights she uses it.   Return to Care: Return in about 6 months (around 02/06/2021).   Lenice Llamas, MD Pulmonary and [place]

## 2020-08-06 NOTE — Progress Notes (Signed)
PFT done today. 

## 2020-08-06 NOTE — Patient Instructions (Addendum)
Please schedule follow up scheduled with myself in 6 months.  If my schedule is not open yet, we will contact you with a reminder closer to that time.  I will review your CPAP download and call you if any changes need to be made.  Keep working with the CPAP! It will help a lot with your other symptoms.

## 2021-01-23 ENCOUNTER — Other Ambulatory Visit: Payer: Self-pay | Admitting: Gastroenterology

## 2021-01-28 ENCOUNTER — Other Ambulatory Visit: Payer: Self-pay | Admitting: Internal Medicine

## 2021-01-28 DIAGNOSIS — Z1231 Encounter for screening mammogram for malignant neoplasm of breast: Secondary | ICD-10-CM

## 2021-01-29 ENCOUNTER — Ambulatory Visit: Payer: Medicare Other

## 2021-02-07 ENCOUNTER — Ambulatory Visit: Payer: Medicare Other

## 2021-05-09 ENCOUNTER — Other Ambulatory Visit: Payer: Self-pay | Admitting: Internal Medicine

## 2021-05-10 LAB — CBC
HCT: 42.5 % (ref 35.0–45.0)
Hemoglobin: 13.2 g/dL (ref 11.7–15.5)
MCH: 22.6 pg — ABNORMAL LOW (ref 27.0–33.0)
MCHC: 31.1 g/dL — ABNORMAL LOW (ref 32.0–36.0)
MCV: 72.8 fL — ABNORMAL LOW (ref 80.0–100.0)
MPV: 11.4 fL (ref 7.5–12.5)
Platelets: 219 10*3/uL (ref 140–400)
RBC: 5.84 10*6/uL — ABNORMAL HIGH (ref 3.80–5.10)
RDW: 15.8 % — ABNORMAL HIGH (ref 11.0–15.0)
WBC: 6 10*3/uL (ref 3.8–10.8)

## 2021-05-10 LAB — COMPLETE METABOLIC PANEL WITH GFR
AG Ratio: 1.5 (calc) (ref 1.0–2.5)
ALT: 13 U/L (ref 6–29)
AST: 11 U/L (ref 10–35)
Albumin: 4 g/dL (ref 3.6–5.1)
Alkaline phosphatase (APISO): 105 U/L (ref 37–153)
BUN: 14 mg/dL (ref 7–25)
CO2: 29 mmol/L (ref 20–32)
Calcium: 9.2 mg/dL (ref 8.6–10.4)
Chloride: 101 mmol/L (ref 98–110)
Creat: 0.89 mg/dL (ref 0.50–1.05)
Globulin: 2.6 g/dL (calc) (ref 1.9–3.7)
Glucose, Bld: 95 mg/dL (ref 65–99)
Potassium: 3.5 mmol/L (ref 3.5–5.3)
Sodium: 141 mmol/L (ref 135–146)
Total Bilirubin: 0.6 mg/dL (ref 0.2–1.2)
Total Protein: 6.6 g/dL (ref 6.1–8.1)
eGFR: 71 mL/min/{1.73_m2} (ref >=60)

## 2021-05-10 LAB — TSH: TSH: 1 mIU/L (ref 0.40–4.50)

## 2021-05-10 LAB — LIPID PANEL
Cholesterol: 155 mg/dL (ref <200)
HDL: 54 mg/dL (ref >=50)
LDL Cholesterol (Calc): 85 mg/dL (calc)
Non-HDL Cholesterol (Calc): 101 mg/dL (calc) (ref <130)
Total CHOL/HDL Ratio: 2.9 (calc) (ref <5.0)
Triglycerides: 74 mg/dL (ref <150)

## 2021-05-10 LAB — VITAMIN D 25 HYDROXY (VIT D DEFICIENCY, FRACTURES): Vit D, 25-Hydroxy: 30 ng/mL (ref 30–100)

## 2021-09-14 ENCOUNTER — Ambulatory Visit (HOSPITAL_COMMUNITY)
Admission: EM | Admit: 2021-09-14 | Discharge: 2021-09-14 | Disposition: A | Discharge status: Home / Self Care | Payer: Medicare Other | Attending: Internal Medicine | Admitting: Internal Medicine

## 2021-09-14 ENCOUNTER — Encounter (HOSPITAL_COMMUNITY): Payer: Self-pay

## 2021-09-14 DIAGNOSIS — Z1152 Encounter for screening for COVID-19: Secondary | ICD-10-CM

## 2021-09-14 DIAGNOSIS — R519 Headache, unspecified: Secondary | ICD-10-CM | POA: Diagnosis present

## 2021-09-14 MED ORDER — LORATADINE 10 MG PO TABS: 10.0000 mg | ORAL_TABLET | Freq: Every day | ORAL | 0 refills | Status: AC

## 2021-09-14 MED ORDER — ACETAMINOPHEN 325 MG PO TABS
975.0000 mg | ORAL_TABLET | Freq: Once | ORAL | Status: AC
Start: 2021-09-14 — End: 2021-09-14
  Administered 2021-09-14: 975 mg via ORAL

## 2021-09-14 MED ORDER — ACETAMINOPHEN 325 MG PO TABS
ORAL_TABLET | ORAL | Status: AC
  Filled 2021-09-14: qty 3

## 2021-09-14 MED ORDER — ACETAMINOPHEN 500 MG PO TABS: 1000.0000 mg | ORAL_TABLET | Freq: Four times a day (QID) | ORAL | 0 refills | Status: AC | PRN

## 2021-09-14 NOTE — Discharge Instructions (Signed)
Your physical exam findings in the clinic are excellent today. I would like for you to follow-up with your primary care provider in the next week or so regarding your ongoing headache. Continue to limit the salt in your diet to reduce blood pressure. Take Tylenol every 6 hours as needed for headache.  We gave you a dose here so your next dose may be in 6 hours. Take Claritin once daily to help dry up nasal mucus and hopefully help with headache if it is allergy related.   If you develop any new or worsening symptoms or do not improve in the next 2 to 3 days, please return.  If your symptoms are severe, please go to the emergency room.  Follow-up with your primary care provider for further evaluation and management of your symptoms as well as ongoing wellness visits.  I hope you feel better!

## 2021-09-14 NOTE — ED Triage Notes (Signed)
Pt is here for headaches and cramping on the right side of hip x 6 days.pt stated she had a sharp pain on the left side of head 3 days radiating to the neck .

## 2021-09-14 NOTE — ED Provider Notes (Signed)
[facility]    CSN: [id number] Arrival date & time: 09/14/21  1007      History   Chief Complaint Chief Complaint  Patient presents with   Headache    HPI Alexis Wheeler is a 66 y.o. female.   Patient presents to urgent care for evaluation of headache that she has had off and on since Monday September 08, 2021 since her follow-up appointment with her PCP. She has noticed that her blood pressure has been elevated over the last week and wonders if this is related to too much salt on eggs recently. Headache is currently a 3 on a scale of 0-10 and only to the left side of her head. Never crosses midline. States she took ibuprofen for this headache which helped to "bring it down" 3 days ago on Friday September 12, 2021. Headache 3 days ago was a pounding sensation and she states it radiated down her left neck, but she denies radiation of head pain at this time. She denies pain to anywhere else in her body. No vision changes/blurry vision. No urinary symptoms, abdominal pain, back pain, and dizziness. Headache is nagging and "won't fully go away". She has had these types of headaches in the past, but when she was much younger. States she "grew out of them".  Denies recent viral URI symptoms and exposure to viral illness. She is vaccinated against COVID-19, but has not had boosters. States she is a "borderline" diabetic. She drinks "right much" water and states she drinks at least 2-3 bottles of water per day. Drinks 1 cup of coffee in the morning and 1 soda per day. Tylenol makes headache better temporarily, but it comes back. Denies photophobia, movement doesn't make headache worse. Cannot identify triggering or relieving factors.  Denies smoking and drinking.    Headache   Past Medical History:  Diagnosis Date   Anemia    childhood   Aortic insufficiency    mild to moderate by echo 07/2020   Arthritis    right knee   Ascending aortic dissection [facility])    s/p repair by Dr.  [name].  2D echo 07/2020 with aortic root 41mm   Blood transfusion without reported diagnosis    with heart surgery-repair aorta   CAD (coronary artery disease)    a. 04/2015: cath showed 30% Ost RCA stenosis, 20% mid-LAD stenosis, and normal LV function.    Cataract    bilateral eyes, per pt.   Congestive dilated cardiomyopathy (HCC) 08/01/2014   DVT (deep venous thrombosis) (HCC)    right   GERD    HEMATURIA UNSPECIFIED    History of kidney stones    HYPERLIPIDEMIA    HYPERTENSION    Morbid obesity (HCC)    Myocardial infarction (HCC)    01/30/2014   MYOSITIS    OSA on CPAP 05/10/2017   mild OSA with an AHI of 5.2/hr overall but severe during REM sleep with an AHI of 41.4/hr and underwent CPAP titration to 12cm H2O   Pulmonary embolism with acute cor pulmonale (HCC) 03/2020   Saddle embolism with acute cor pulmonale   Sleep apnea    cpap-  doesnt use it- last study years ago   Vertigo     Patient Active Problem List   Diagnosis Date Noted   Aortic insufficiency 07/25/2020   Acute pulmonary embolism (HCC) 04/01/2020   Saddle embolus of pulmonary artery (HCC)    Dyspnea    Irritable bowel syndrome 05/23/2019   Ascending aortic  dissection (HCC)    OSA on CPAP 05/10/2017   Benign essential HTN 05/10/2017   Coronary artery disease 03/08/2016   Diastolic dysfunction without heart failure 03/08/2016   Chest pain 03/07/2016   Fatigue 04/30/2015   Hypokalemia 12/20/2014   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   Plantar fasciitis of left foot 05/02/2014   Non-compliant behavior 03/27/2014   S/P aortic dissection repair 03/03/2014   Lumbosacral spondylosis without myelopathy 01/26/2014   Greater trochanteric bursitis of right hip 10/10/2013   Chronic low back pain 09/26/2013   Chronic right hip pain 09/26/2013   Allergic rhinitis 08/29/2012   Kidney stone 12/31/2010   Morbid obesity (HCC) 12/18/2009   GERD  12/18/2009   HEMATURIA UNSPECIFIED 12/17/2009   Hyperlipidemia with target LDL less than 70 12/16/2009   Hypertensive heart disease 12/16/2009    Past Surgical History:  Procedure Laterality Date   ABDOMINAL HYSTERECTOMY  1983   CARDIAC CATHETERIZATION N/A 04/30/2015   Procedure: Left Heart Cath and Coronary Angiography;  Surgeon: Peter M Swaziland, MD;  Location: [facility] INVASIVE CV LAB;  Service: Cardiovascular;  Laterality: N/A;   CHOLECYSTECTOMY  2001   CYSTOSCOPY W/ RETROGRADES Left 08/02/2015   Procedure: CYSTOSCOPY WITH LEFT RETROGRADE PYELOGRAM;  Surgeon: Jerilee Field, MD;  Location: [facility] ORS;  Service: Urology;  Laterality: Left;   CYSTOSCOPY WITH URETEROSCOPY, STONE BASKETRY AND STENT PLACEMENT Left 08/02/2015   Procedure:  Geoffry Paradise ;  Surgeon: Jerilee Field, MD;  Location: [facility]S;  Service: Urology;  Laterality: Left;   CYSTOSCOPY/URETEROSCOPY/HOLMIUM LASER/STENT PLACEMENT Left 08/02/2015   Procedure: CYSTOSCOPY/URETEROSCOPY/HOLMIUM LASER/STENT PLACEMENT-LEFT;  Surgeon: Jerilee Field, MD;  Location: [facility];  Service: Urology;  Laterality: Left;   HOLMIUM LASER APPLICATION Left 08/02/2015   Procedure: HOLMIUM LASER APPLICATION;  Surgeon: Jerilee Field, MD;  Location: [facility];  Service: Urology;  Laterality: Left;   REPLACEMENT ASCENDING AORTA N/A 03/03/2014   Procedure: REPAIR OF ASCENDING AORTIC DISSECTION;  Surgeon: Delight Ovens, MD;  Location: [facility] OR;  Service: Open Heart Surgery;  Laterality: N/A;    OB History   No obstetric history on file.      Home Medications    Prior to Admission medications   Medication Sig Start Date End Date Taking? Authorizing Provider  acetaminophen (TYLENOL) 500 MG tablet Take 2 tablets (1,000 mg total) by mouth every 6 (six) hours as needed. 09/14/21  Yes Carlisle Beers, FNP  loratadine (CLARITIN) 10 MG tablet Take 1 tablet (10 mg total) by mouth daily. 09/14/21  Yes Carlisle Beers, FNP  [phone] Take 1 capsule by mouth once  a week.    [provider]  amLODipine (NORVASC) 10 MG tablet Take 1 tablet (10 mg total) by mouth daily. 06/20/20   Quintella Reichert, MD  amLODipine (NORVASC) 10 MG tablet Take 1 tablet by mouth daily.    [provider]  apixaban (ELIQUIS) 5 MG TABS tablet Take 1 tablet by mouth 2 (two) times daily.    [provider]  aspirin EC 81 MG EC tablet Take 1 tablet (81 mg total) by mouth daily. 03/14/14   Collins, Gina L, PA-C  atorvastatin (LIPITOR) 40 MG tablet TAKE 1 TABLET BY MOUTH ONCE DAILY AT 6:00 IN THE  EVENING 06/20/20   Quintella Reichert, MD  carvedilol (COREG) 12.5 MG tablet Take 1 tablet (12.5 mg total) by mouth 2 (two) times daily. 06/20/20   Quintella Reichert, MD  carvedilol (COREG) 12.5 MG tablet Take  1 tablet by mouth 2 (two) times daily.    [provider]  colesevelam (WELCHOL) 625 MG tablet colesevelam 625 mg tablet    [provider]  cyclobenzaprine (FLEXERIL) 10 MG tablet Take 1 tablet (10 mg total) by mouth 3 (three) times daily as needed for muscle spasms. 06/21/20   Roxy Horseman, PA-C  diclofenac Sodium (VOLTAREN) 1 % GEL Apply 2-4 g topically 4 (four) times daily as needed (pain). 12/26/18   [provider]  ELIQUIS 5 MG TABS tablet Take 5 mg by mouth 2 (two) times daily. 07/26/20   [provider]  ferrous sulfate 325 (65 FE) MG tablet Take 1 tablet (325 mg total) by mouth daily with breakfast. 04/04/20   Marinda Elk, MD  folic acid (FOLVITE) 1 MG tablet Take 1 mg by mouth daily.    [provider]  folic acid (FOLVITE) 1 MG tablet Take 1 tablet by mouth daily.    [provider]  Vitamin D, Ergocalciferol, (DRISDOL) 1.25 MG (50000 UNIT) CAPS capsule Take 50,000 Units by mouth once a week. 03/28/20   [provider]    Family History Family History  Problem Relation Age of Onset   Breast cancer Mother 37   Aortic dissection Mother 28   Hypertension Mother    Throat cancer Brother         1 bro living   Colon cancer Brother    Esophageal cancer Brother    Hypertension Father    Breast cancer Sister    Hypertension Sister    Hypertension Daughter    Seizures Neg Hx    Liver cancer Neg Hx    Pancreatic cancer Neg Hx    Rectal cancer Neg Hx    Stomach cancer Neg Hx     Social History Social History   Tobacco Use   Smoking status: Never   Smokeless tobacco: Never   Tobacco comments:    separated spring 2013- lives with 1 dtr -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  Per HPI   Physical Exam Triage Vital Signs ED Triage Vitals  Enc Vitals Group     BP 09/14/21 1036 (!) 146/48     Pulse Rate 09/14/21 1036 (!) 52     Resp 09/14/21 1036 12     Temp 09/14/21 1036 98.2 F (36.8 C)     Temp Source 09/14/21 1036 Oral     SpO2 09/14/21 1036 100 %     Weight 09/14/21 1043 194 lb (88 kg)     Height 09/14/21 1043 5\' 3"  (1.6 m)     Head Circumference --      Peak Flow --      Pain Score 09/14/21 1042 3     Pain Loc --      Pain Edu? --      Excl. in GC? --    No data found.  Updated Vital Signs BP (!) 146/48 (BP Location: Left Arm)   Pulse (!) 52   Temp 98.2 F (36.8 C) (Oral)   Resp 12   Ht 5\' 3"  (1.6 m)   Wt 194 lb (88 kg)  SpO2 100%   BMI 34.37 kg/m   Visual Acuity Right Eye Distance:   Left Eye Distance:   Bilateral Distance:    Right Eye Near:   Left Eye Near:    Bilateral Near:     Physical Exam Vitals and nursing note reviewed.  Constitutional:      Appearance: Normal appearance. She is not ill-appearing or toxic-appearing.     Comments: Very pleasant patient sitting on exam in position of comfort table in no acute distress.   HENT:     Head: Normocephalic  and atraumatic.     Right Ear: Hearing, tympanic membrane, ear canal and external ear normal.     Left Ear: Hearing, tympanic membrane, ear canal and external ear normal.     Nose: Nose normal.     Mouth/Throat:     Lips: Pink.     Mouth: Mucous membranes are moist.  Eyes:     General: Lids are normal. Vision grossly intact. Gaze aligned appropriately.     Extraocular Movements: Extraocular movements intact.     Conjunctiva/sclera: Conjunctivae normal.  Cardiovascular:     Rate and Rhythm: Normal rate and regular rhythm.     Heart sounds: Normal heart sounds, S1 normal and S2 normal.  Pulmonary:     Effort: Pulmonary effort is normal. No respiratory distress.     Breath sounds: Normal breath sounds and air entry.  Abdominal:     Palpations: Abdomen is soft.  Musculoskeletal:     Cervical back: Neck supple.  Skin:    General: Skin is warm and dry.     Capillary Refill: Capillary refill takes less than 2 seconds.     Findings: No rash.  Neurological:     General: No focal deficit present.     Mental Status: She is alert and oriented to person, place, and time. Mental status is at baseline.     Cranial Nerves: Cranial nerves 2-12 are intact. No dysarthria or facial asymmetry.     Sensory: Sensation is intact.     Motor: Motor function is intact.     Coordination: Coordination is intact.     Gait: Gait is intact.     Comments: 5/5 power throughout.  Nonfocal neuro exam.  Psychiatric:        Mood and Affect: Mood normal.        Speech: Speech normal.        Behavior: Behavior normal.        Thought Content: Thought content normal.        Judgment: Judgment normal.      UC Treatments / Results  Labs (all labs ordered are listed, but only abnormal results are displayed) Labs Reviewed  SARS CORONAVIRUS 2 (TAT 6-24 HRS)    EKG   Radiology No results found.  Procedures Procedures (including critical care time)  Medications Ordered in UC Medications  acetaminophen  (TYLENOL) tablet 975 mg (975 mg Oral Given 09/14/21 1119)    Initial Impression / Assessment and Plan / UC Course  I have reviewed the triage vital signs and the nursing notes.  Pertinent labs & imaging results that were available during my care of the patient were reviewed by me and considered in my medical decision making (see chart for details).   1.  Bad headache Neurologic exam is at baseline and nonfocal.  Patient is well-appearing.  Given Tylenol in clinic for mild head pain.  Unknown etiology of atraumatic headache, although given stable neurologic exam and  hemodynamically stable vital signs there is no indication for immediate referral to the emergency department for further investigation imaging at this time.  Suspect headache may be related to seasonal allergies due to intermittent nasal drainage reported.  Advised patient to begin taking Claritin once daily and increase water intake to at least 8 cups of water per day to stay well-hydrated.  COVID-19 testing is pending since patient may be mostly asymptomatic with mild head pain.  Patient may use Tylenol every 6 hours as needed for head pain and has responded well to this at home. Dose of 975 mg of Tylenol given in clinic.  Advised PCP follow-up in the next 2 to 3 days if headache does not improve.  If headaches suddenly worsen or become severe with associated blurry vision, chest pain, weakness, shortness of breath, or neck pain, advised patient to go to the nearest emergency department for further evaluation.  Discussed physical exam and available lab work findings in clinic with patient.  Counseled patient regarding appropriate use of medications and potential side effects for all medications recommended or prescribed today. Discussed red flag signs and symptoms of worsening condition,when to call the PCP office, return to urgent care, and when to seek higher level of care in the emergency department. Patient verbalizes understanding and  agreement with plan. All questions answered. Patient discharged in stable condition.  Final Clinical Impressions(s) / UC Diagnoses   Final diagnoses:  Bad headache  Encounter for screening for COVID-19     Discharge Instructions      Your physical exam findings in the clinic are excellent today. I would like for you to follow-up with your primary care provider in the next week or so regarding your ongoing headache. Continue to limit the salt in your diet to reduce blood pressure. Take Tylenol every 6 hours as needed for headache.  We gave you a dose here so your next dose may be in 6 hours. Take Claritin once daily to help dry up nasal mucus and hopefully help with headache if it is allergy related.   If you develop any new or worsening symptoms or do not improve in the next 2 to 3 days, please return.  If your symptoms are severe, please go to the emergency room.  Follow-up with your primary care provider for further evaluation and management of your symptoms as well as ongoing wellness visits.  I hope you feel better!     ED Prescriptions     Medication Sig Dispense Auth. Provider   acetaminophen (TYLENOL) 500 MG tablet Take 2 tablets (1,000 mg total) by mouth every 6 (six) hours as needed. 30 tablet Reita May M, FNP   loratadine (CLARITIN) 10 MG tablet Take 1 tablet (10 mg total) by mouth daily. 30 tablet Carlisle Beers, FNP      PDMP not reviewed this encounter.   Carlisle Beers, [place] 09/17/21 2231

## 2021-09-15 LAB — SARS CORONAVIRUS 2 (TAT 6-24 HRS): SARS Coronavirus 2: NEGATIVE

## 2021-10-01 ENCOUNTER — Encounter (HOSPITAL_COMMUNITY): Payer: Self-pay

## 2021-10-01 ENCOUNTER — Emergency Department (HOSPITAL_COMMUNITY)
Admission: EM | Admit: 2021-10-01 | Discharge: 2021-10-01 | Disposition: A | Discharge status: Home / Self Care | Payer: Medicare Other | Attending: Emergency Medicine | Admitting: Emergency Medicine

## 2021-10-01 DIAGNOSIS — Z7982 Long term (current) use of aspirin: Secondary | ICD-10-CM | POA: Insufficient documentation

## 2021-10-01 DIAGNOSIS — Z7901 Long term (current) use of anticoagulants: Secondary | ICD-10-CM | POA: Insufficient documentation

## 2021-10-01 DIAGNOSIS — Z79899 Other long term (current) drug therapy: Secondary | ICD-10-CM | POA: Diagnosis not present

## 2021-10-01 DIAGNOSIS — I10 Essential (primary) hypertension: Secondary | ICD-10-CM | POA: Diagnosis not present

## 2021-10-01 DIAGNOSIS — Z20822 Contact with and (suspected) exposure to covid-19: Secondary | ICD-10-CM | POA: Diagnosis not present

## 2021-10-01 DIAGNOSIS — R531 Weakness: Secondary | ICD-10-CM | POA: Insufficient documentation

## 2021-10-01 DIAGNOSIS — E876 Hypokalemia: Secondary | ICD-10-CM | POA: Insufficient documentation

## 2021-10-01 LAB — CBC WITH DIFFERENTIAL/PLATELET
Abs Immature Granulocytes: 0.01 10*3/uL (ref 0.00–0.07)
Basophils Absolute: 0 10*3/uL (ref 0.0–0.1)
Basophils Relative: 0 %
Eosinophils Absolute: 0.3 10*3/uL (ref 0.0–0.5)
Eosinophils Relative: 5 %
HCT: 39.2 % (ref 36.0–46.0)
Hemoglobin: 12.3 g/dL (ref 12.0–15.0)
Immature Granulocytes: 0 %
Lymphocytes Relative: 42 %
Lymphs Abs: 2.5 10*3/uL (ref 0.7–4.0)
MCH: 23.1 pg — ABNORMAL LOW (ref 26.0–34.0)
MCHC: 31.4 g/dL (ref 30.0–36.0)
MCV: 73.7 fL — ABNORMAL LOW (ref 80.0–100.0)
Monocytes Absolute: 0.6 10*3/uL (ref 0.1–1.0)
Monocytes Relative: 10 %
Neutro Abs: 2.6 10*3/uL (ref 1.7–7.7)
Neutrophils Relative %: 43 %
Platelets: 197 10*3/uL (ref 150–400)
RBC: 5.32 MIL/uL — ABNORMAL HIGH (ref 3.87–5.11)
RDW: 15.7 % — ABNORMAL HIGH (ref 11.5–15.5)
WBC: 6 10*3/uL (ref 4.0–10.5)
nRBC: 0 % (ref 0.0–0.2)

## 2021-10-01 LAB — URINALYSIS, ROUTINE W REFLEX MICROSCOPIC
Bilirubin Urine: NEGATIVE
Glucose, UA: NEGATIVE mg/dL
Hgb urine dipstick: NEGATIVE
Ketones, ur: NEGATIVE mg/dL
Nitrite: NEGATIVE
Protein, ur: NEGATIVE mg/dL
Specific Gravity, Urine: 1.015 (ref 1.005–1.030)
pH: 7 (ref 5.0–8.0)

## 2021-10-01 LAB — TSH: TSH: 1.159 u[IU]/mL (ref 0.350–4.500)

## 2021-10-01 LAB — COMPREHENSIVE METABOLIC PANEL
ALT: 16 U/L (ref 0–44)
AST: 14 U/L — ABNORMAL LOW (ref 15–41)
Albumin: 3.5 g/dL (ref 3.5–5.0)
Alkaline Phosphatase: 77 U/L (ref 38–126)
Anion gap: 7 (ref 5–15)
BUN: 14 mg/dL (ref 8–23)
CO2: 31 mmol/L (ref 22–32)
Calcium: 8.8 mg/dL — ABNORMAL LOW (ref 8.9–10.3)
Chloride: 104 mmol/L (ref 98–111)
Creatinine, Ser: 1.13 mg/dL — ABNORMAL HIGH (ref 0.44–1.00)
GFR, Estimated: 53 mL/min — ABNORMAL LOW (ref >60)
Glucose, Bld: 101 mg/dL — ABNORMAL HIGH (ref 70–99)
Potassium: 2.8 mmol/L — ABNORMAL LOW (ref 3.5–5.1)
Sodium: 142 mmol/L (ref 135–145)
Total Bilirubin: 0.5 mg/dL (ref 0.3–1.2)
Total Protein: 6.7 g/dL (ref 6.5–8.1)

## 2021-10-01 LAB — MAGNESIUM: Magnesium: 2 mg/dL (ref 1.7–2.4)

## 2021-10-01 LAB — SARS CORONAVIRUS 2 BY RT PCR: SARS Coronavirus 2 by RT PCR: NEGATIVE

## 2021-10-01 LAB — PHOSPHORUS: Phosphorus: 2.6 mg/dL (ref 2.5–4.6)

## 2021-10-01 LAB — D-DIMER, QUANTITATIVE: D-Dimer, Quant: 0.63 ug/mL-FEU — ABNORMAL HIGH (ref 0.00–0.50)

## 2021-10-01 MED ORDER — POTASSIUM CHLORIDE CRYS ER 20 MEQ PO TBCR
40.0000 meq | EXTENDED_RELEASE_TABLET | Freq: Once | ORAL | Status: AC
  Administered 2021-10-01: 40 meq via ORAL
  Filled 2021-10-01: qty 2

## 2021-10-01 MED ORDER — SODIUM CHLORIDE 0.9 % IV BOLUS
500.0000 mL | Freq: Once | INTRAVENOUS | Status: AC
  Administered 2021-10-01: 500 mL via INTRAVENOUS

## 2021-10-01 MED ORDER — POTASSIUM CHLORIDE 10 MEQ/100ML IV SOLN
10.0000 meq | INTRAVENOUS | Status: AC
  Administered 2021-10-01 (×4): 10 meq via INTRAVENOUS
  Filled 2021-10-01 (×4): qty 100

## 2021-10-01 MED ORDER — POTASSIUM CHLORIDE CRYS ER 20 MEQ PO TBCR
40.0000 meq | EXTENDED_RELEASE_TABLET | Freq: Once | ORAL | Status: AC
Start: 2021-10-01 — End: 2021-10-01
  Administered 2021-10-01: 40 meq via ORAL
  Filled 2021-10-01: qty 2

## 2021-10-01 MED ORDER — POTASSIUM CHLORIDE ER 10 MEQ PO TBCR: 40.0000 meq | EXTENDED_RELEASE_TABLET | Freq: Every day | ORAL | 0 refills | Status: DC

## 2021-10-01 NOTE — ED Provider Triage Note (Signed)
Emergency Medicine Provider Triage Evaluation Note  Alexis Wheeler , a 66 y.o. female  was evaluated in triage.  Pt complains of generalized fatigue over the past 1 week.  She states that she felt this way in the past when she had a blood clot in her lungs, however at that time she had chest pain and shortness of breath which she does not now.  She denies fever, URI symptoms, cough.  No nausea, vomiting, diarrhea or urinary symptoms.  She is on anticoagulation and compliant.  Denies blood in urine or stool.  Review of Systems  Positive: Fatigue Negative: Fever, chest pain, shortness of breath.  Physical Exam  BP (!) 151/64 (BP Location: Left Arm)   Pulse 63   Temp 98.2 F (36.8 C) (Oral)   Resp 18   SpO2 100%  Gen:   Awake, no distress   Resp:  Normal effort  MSK:   Moves extremities without difficulty  Other:  Heart normal rate, reg rhythm  Medical Decision Making  Medically screening exam initiated at 12:31 PM.  Appropriate orders placed.  [name] was informed that the remainder of the evaluation will be completed by another provider, this initial triage assessment does not replace that evaluation, and the importance of remaining in the ED until their evaluation is complete.     Carlisle Cater, PA-C 10/01/21 1232

## 2021-10-01 NOTE — Discharge Instructions (Addendum)
Please follow-up with your PCP in 1 week for repeat lab work. Take your potassium daily for 3 days.  You will need to have your electrolytes monitored closely since you have some new medication changes, and you are prone to hypokalemia.  If you start having chest pain, shortness of breath, severe abdominal pain please return to the ER.

## 2021-10-01 NOTE — ED Provider Notes (Signed)
[facility] DEPT Provider Note   CSN: [id number] Arrival date & time: 10/01/21  1136     History  Chief Complaint  Patient presents with  . Weakness    Alexis Wheeler is a 66 y.o. female, hx of HTN, who presents to the ED secondary to generalized fatigue for the last couple weeks.  She states she has just been feeling weak and more tired.  Denies any fever, chills, chest pain, shortness of breath, abdominal discomfort, dysuria, polyuria.  States that she thought it may have been her blood pressure, so she went to her PCP, and they started her on losartan for her blood pressure last week, but she states that the fatigue has continued.  She notes she has even tried using her CPAP more frequently, to make sure she is sleeping well, but that has not improved her symptoms at all.  She went to come to the ER to get blood work back done, and to be further evaluated.  Denies any change in weight.  Also reports that for the last few years she has had intermittent headaches, mild in nature, typically twice a week, and goes away with Tylenol.  She denies having any headache at this time.  No vision changes, no weakness on one side of her body, difficulty with speech or swallowing, no confusion.   Weakness Associated symptoms: no abdominal pain, no chest pain and no fever        Home Medications Prior to Admission medications   Medication Sig Start Date End Date Taking? Authorizing Provider  potassium chloride (KLOR-CON) 10 MEQ tablet Take 4 tablets (40 mEq total) by mouth daily for 3 days. 10/01/21 10/04/21 [place]  [address] Take 1 capsule by mouth once a week.    [provider]  acetaminophen (TYLENOL) 500 MG tablet Take 2 tablets (1,000 mg total) by mouth every 6 (six) hours as needed. 09/14/21   Talbot Grumbling, FNP  amLODipine (NORVASC) 10 MG tablet Take 1 tablet (10 mg total) by mouth daily. 06/20/20   Sueanne Margarita, MD   amLODipine (NORVASC) 10 MG tablet Take 1 tablet by mouth daily.    [provider]  apixaban (ELIQUIS) 5 MG TABS tablet Take 1 tablet by mouth 2 (two) times daily.    [provider]  aspirin EC 81 MG EC tablet Take 1 tablet (81 mg total) by mouth daily. 03/14/14   Collins, Gina L, PA-C  atorvastatin (LIPITOR) 40 MG tablet TAKE 1 TABLET BY MOUTH ONCE DAILY AT 6:00 IN THE  EVENING 06/20/20   Sueanne Margarita, MD  carvedilol (COREG) 12.5 MG tablet Take 1 tablet (12.5 mg total) by mouth 2 (two) times daily. 06/20/20   Sueanne Margarita, MD  carvedilol (COREG) 12.5 MG tablet Take 1 tablet by mouth 2 (two) times daily.    [provider]  colesevelam (WELCHOL) 625 MG tablet colesevelam 625 mg tablet    [provider]  cyclobenzaprine (FLEXERIL) 10 MG tablet Take 1 tablet (10 mg total) by mouth 3 (three) times daily as needed for muscle spasms. 06/21/20   Montine Circle, PA-C  diclofenac Sodium (VOLTAREN) 1 % GEL Apply 2-4 g topically 4 (four) times daily as needed (pain). 12/26/18   [provider]  ELIQUIS 5 MG TABS tablet Take 5 mg by mouth 2 (two) times daily. 07/26/20   [provider]  ferrous sulfate 325 (65 FE) MG tablet Take 1 tablet (325 mg  total) by mouth daily with breakfast. 04/04/20   Charlynne Cousins, MD  folic acid (FOLVITE) 1 MG tablet Take 1 mg by mouth daily.    [provider]  folic acid (FOLVITE) 1 MG tablet Take 1 tablet by mouth daily.    [provider]  loratadine (CLARITIN) 10 MG tablet Take 1 tablet (10 mg total) by mouth daily. 09/14/21   Talbot Grumbling, FNP  losartan-hydrochlorothiazide (HYZAAR) 50-12.5 MG tablet Take 1 tablet by mouth daily. 09/24/21   [provider]  Vitamin D, Ergocalciferol, (DRISDOL) 1.25 MG (50000 UNIT) CAPS capsule Take 50,000 Units by mouth once a week. 03/28/20   [provider]      Allergies    Hydralazine and Motrin [ibuprofen]    Review of Systems    Review of Systems  Constitutional:  Positive for fatigue. Negative for fever.  Cardiovascular:  Negative for chest pain.  Gastrointestinal:  Negative for abdominal pain.  Neurological:  Positive for weakness.    Physical Exam Updated Vital Signs BP (!) 160/67   Pulse (!) 56   Temp 98.1 F (36.7 C) (Oral)   Resp 19   SpO2 100%  Physical Exam Vitals and nursing note reviewed.  Constitutional:      Appearance: Normal appearance.  HENT:     Head: Normocephalic and atraumatic.     Nose: Nose normal.     Mouth/Throat:     Mouth: Mucous membranes are moist.  Eyes:     Extraocular Movements: Extraocular movements intact.     Conjunctiva/sclera: Conjunctivae normal.     Pupils: Pupils are equal, round, and reactive to light.  Cardiovascular:     Rate and Rhythm: Normal rate and regular rhythm.  Pulmonary:     Effort: Pulmonary effort is normal.     Breath sounds: Normal breath sounds.  Abdominal:     General: Abdomen is flat. Bowel sounds are normal.     Palpations: Abdomen is soft.  Musculoskeletal:        General: Normal range of motion.     Cervical back: Normal range of motion and neck supple.  Skin:    General: Skin is warm and dry.     Capillary Refill: Capillary refill takes less than 2 seconds.  Neurological:     General: No focal deficit present.     Mental Status: She is alert.  Psychiatric:        Mood and Affect: Mood normal.        Thought Content: Thought content normal.     ED Results / Procedures / Treatments   Labs (all labs ordered are listed, but only abnormal results are displayed) Labs Reviewed  CBC WITH DIFFERENTIAL/PLATELET - Abnormal; Notable for the following components:      Result Value   RBC 5.32 (*)    MCV 73.7 (*)    MCH 23.1 (*)    RDW 15.7 (*)    All other components within normal limits  COMPREHENSIVE METABOLIC PANEL - Abnormal; Notable for the following components:   Potassium 2.8 (*)    Glucose, Bld 101 (*)    Creatinine,  Ser 1.13 (*)    Calcium 8.8 (*)    AST 14 (*)    GFR, Estimated 53 (*)    All other components within normal limits  URINALYSIS, ROUTINE W REFLEX MICROSCOPIC - Abnormal; Notable for the following components:   Leukocytes,Ua TRACE (*)    Bacteria, UA RARE (*)    All other  components within normal limits  D-DIMER, QUANTITATIVE - Abnormal; Notable for the following components:   D-Dimer, Quant 0.63 (*)    All other components within normal limits  SARS CORONAVIRUS 2 BY RT PCR  URINE CULTURE  TSH  MAGNESIUM  PHOSPHORUS    EKG EKG Interpretation  Date/Time:  Wednesday October 01 2021 11:56:17 EDT Ventricular Rate:  59 PR Interval:  197 QRS Duration: 106 QT Interval:  475 QTC Calculation: 471 R Axis:   37 Text Interpretation: Sinus rhythm Confirmed by Godfrey Pick [phone]) on 10/01/2021 3:56:34 PM  Radiology No results found.  Procedures Procedures    Medications Ordered in ED Medications  potassium chloride 10 mEq in 100 mL IVPB (10 mEq Intravenous New Bag/Given 10/01/21 2008)  sodium chloride 0.9 % bolus 500 mL (500 mLs Intravenous New Bag/Given 10/01/21 1617)  potassium chloride SA (KLOR-CON M) CR tablet 40 mEq (40 mEq Oral Given 10/01/21 1603)    ED Course/ Medical Decision Making/ A&P Clinical Course as of 10/01/21 2240  Wed Oct 01, 2021  1645 D-Dimer, Quant(!): 0.63 [BS]    Clinical Course User Index [BS] Quinta Eimer, Si Gaul, PA                           Medical Decision Making Amount and/or Complexity of Data Reviewed Labs: ordered. Decision-making details documented in ED Course.  Risk Prescription drug management.  Patient is a 66 year old female, here with history of hypertension, PE, who presents to the ED secondary to weakness for several weeks.  Found to be have hypokalemia of 2.8.  Replenished with 40 mEq IV, 40 mEq oral.  Discussed importance of taking potassium at home, and following up with PCP in 1 week for repeat labs.  She voiced understanding.   D-dimer was age-adjusted, and as stated VTE was unlikely.  She is informed to return to the ER if she begins having chest pain, shortness of breath, or worsening of her symptoms.  UA was sent to culture given trace bacteria, and trace leukocytes.  She has no symptoms at this time however.  Final Clinical Impression(s) / ED Diagnoses Final diagnoses:  Weakness  Hypokalemia    Rx / DC Orders ED Discharge Orders          Ordered    potassium chloride (KLOR-CON) 10 MEQ tablet  Daily        10/01/21 2235              Quintavia Rogstad, Si Gaul, PA 10/01/21 2240    Godfrey Pick, MD 10/02/21 0101

## 2021-10-01 NOTE — ED Triage Notes (Signed)
Pt arrived via POV, c/o generalized weakness and headache since changing BP medications about a week ago. Denies any SOB, CP

## 2021-10-02 LAB — URINE CULTURE: Culture: NO GROWTH

## 2021-10-08 ENCOUNTER — Other Ambulatory Visit: Payer: Self-pay | Admitting: Internal Medicine

## 2021-10-09 LAB — BASIC METABOLIC PANEL WITH GFR
BUN/Creatinine Ratio: 13 (calc) (ref 6–22)
BUN: 15 mg/dL (ref 7–25)
CO2: 30 mmol/L (ref 20–32)
Calcium: 9.6 mg/dL (ref 8.6–10.4)
Chloride: 100 mmol/L (ref 98–110)
Creat: 1.14 mg/dL — ABNORMAL HIGH (ref 0.50–1.05)
Glucose, Bld: 91 mg/dL (ref 65–99)
Potassium: 3 mmol/L — ABNORMAL LOW (ref 3.5–5.3)
Sodium: 140 mmol/L (ref 135–146)
eGFR: 53 mL/min/{1.73_m2} — ABNORMAL LOW (ref >=60)

## 2021-10-09 LAB — MAGNESIUM: Magnesium: 1.9 mg/dL (ref 1.5–2.5)

## 2021-10-09 LAB — EXTRA LAV TOP TUBE

## 2021-10-17 ENCOUNTER — Ambulatory Visit
Admission: RE | Admit: 2021-10-17 | Discharge: 2021-10-17 | Disposition: A | Discharge status: Home / Self Care | Payer: Medicare Other | Source: Ambulatory Visit | Attending: Internal Medicine | Admitting: Internal Medicine

## 2021-10-17 DIAGNOSIS — Z1231 Encounter for screening mammogram for malignant neoplasm of breast: Secondary | ICD-10-CM

## 2021-10-20 ENCOUNTER — Ambulatory Visit (INDEPENDENT_AMBULATORY_CARE_PROVIDER_SITE_OTHER): Payer: Medicare Other | Admitting: Gastroenterology

## 2021-10-20 ENCOUNTER — Encounter: Payer: Self-pay | Admitting: Gastroenterology

## 2021-10-20 VITALS — BP 138/72 | HR 53 | Ht 63.0 in | Wt 201.1 lb

## 2021-10-20 DIAGNOSIS — K582 Mixed irritable bowel syndrome: Secondary | ICD-10-CM | POA: Diagnosis not present

## 2021-10-20 DIAGNOSIS — Z86010 Personal history of colonic polyps: Secondary | ICD-10-CM | POA: Diagnosis not present

## 2021-10-20 DIAGNOSIS — Z800 Family history of malignant neoplasm of digestive organs: Secondary | ICD-10-CM | POA: Diagnosis not present

## 2021-10-20 MED ORDER — LINACLOTIDE 145 MCG PO CAPS: 145.0000 ug | ORAL_CAPSULE | Freq: Every day | ORAL | 1 refills | Status: DC

## 2021-10-20 MED ORDER — COLESEVELAM HCL 625 MG PO TABS: 625.0000 mg | ORAL_TABLET | ORAL | 0 refills | Status: DC

## 2021-10-20 NOTE — Progress Notes (Signed)
Alexis Wheeler    [id number]    1955-10-08  Primary Care Physician:Avbuere, Christean Grief, MD  Referring Physician: Nolene Ebbs, MD [address] [place],  [place] [address]   Chief complaint: IBS constipation and diarrhea  HPI:  66 year old very pleasant female with history of aortic dissection status post repair in 2016, hypertension and hyperlipidemia here for follow-up visit for alternating constipation and diarrhea She is currently not on any laxatives.  She has tried Metamucil and MiraLAX with no improvement.  She has intermittent abdominal bloating and lower abdominal cramping Denies any dark stool, melena or rectal bleeding No loss of appetite or weight loss Colonoscopy February 08, 2017: 4 mm polyp removed from sigmoid colon, small internal hemorrhoids otherwise normal exam.  Pathology polyp removed was benign colorectal mucosa with no adenomatous tissue.    Outpatient Encounter Medications as of 10/20/2021  Medication Sig   10894 Take 1 capsule by mouth once a week.   acetaminophen (TYLENOL) 500 MG tablet Take 2 tablets (1,000 mg total) by mouth every 6 (six) hours as needed.   amLODipine (NORVASC) 10 MG tablet Take 1 tablet (10 mg total) by mouth daily.   amLODipine (NORVASC) 10 MG tablet Take 1 tablet by mouth daily.   apixaban (ELIQUIS) 5 MG TABS tablet Take 1 tablet by mouth 2 (two) times daily.   aspirin EC 81 MG EC tablet Take 1 tablet (81 mg total) by mouth daily.   atorvastatin (LIPITOR) 40 MG tablet TAKE 1 TABLET BY MOUTH ONCE DAILY AT 6:00 IN THE  EVENING   carvedilol (COREG) 12.5 MG tablet Take 1 tablet (12.5 mg total) by mouth 2 (two) times daily.   carvedilol (COREG) 12.5 MG tablet Take 1 tablet by mouth 2 (two) times daily.   colesevelam (WELCHOL) 625 MG tablet colesevelam 625 mg tablet   cyclobenzaprine (FLEXERIL) 10 MG tablet Take 1 tablet (10 mg total) by mouth 3 (three) times daily as needed for muscle spasms.   diclofenac Sodium (VOLTAREN) 1  % GEL Apply 2-4 g topically 4 (four) times daily as needed (pain).   ELIQUIS 5 MG TABS tablet Take 5 mg by mouth 2 (two) times daily.   ferrous sulfate 325 (65 FE) MG tablet Take 1 tablet (325 mg total) by mouth daily with breakfast.   folic acid (FOLVITE) 1 MG tablet Take 1 mg by mouth daily.   folic acid (FOLVITE) 1 MG tablet Take 1 tablet by mouth daily.   loratadine (CLARITIN) 10 MG tablet Take 1 tablet (10 mg total) by mouth daily.   losartan (COZAAR) 25 MG tablet Take 25 mg by mouth daily.   losartan-hydrochlorothiazide (HYZAAR) 50-12.5 MG tablet Take 1 tablet by mouth daily.   Vitamin D, Ergocalciferol, (DRISDOL) 1.25 MG (50000 UNIT) CAPS capsule Take 50,000 Units by mouth once a week.   [DISCONTINUED] potassium chloride (KLOR-CON) 10 MEQ tablet Take 4 tablets (40 mEq total) by mouth daily for 3 days.   No facility-administered encounter medications on file as of 10/20/2021.    Allergies as of 10/20/2021 - Review Complete 10/20/2021  Allergen Reaction Noted   Hydralazine Nausea Only 04/16/2015   Motrin [ibuprofen] Nausea Only     Past Medical History:  Diagnosis Date   Anemia    childhood   Aortic insufficiency    mild to moderate by echo 07/2020   Arthritis    right knee   Ascending aortic dissection [facility])    s/p repair by Dr.  Gerhardt.  2D echo 07/2020 with aortic root 19m   Blood transfusion without reported diagnosis    with heart surgery-repair aorta   CAD (coronary artery disease)    a. 04/2015: cath showed 30% Ost RCA stenosis, 20% mid-LAD stenosis, and normal LV function.    Cataract    bilateral eyes, per pt.   Congestive dilated cardiomyopathy [place] 08/01/2014   DVT (deep venous thrombosis) (HCC)    right   GERD    HEMATURIA UNSPECIFIED    History of kidney stones    HYPERLIPIDEMIA    HYPERTENSION    Morbid obesity ([facility]    [place] infarction [place]    01/30/2014   MYOSITIS    OSA on CPAP 05/10/2017   mild OSA with an AHI of 5.2/hr overall but severe  during REM sleep with an AHI of 41.4/hr and underwent CPAP titration to 12cm H2O   Pulmonary embolism with acute cor pulmonale ([facility] 03/2020   Saddle embolism with acute cor pulmonale   Sleep apnea    cpap-  doesnt use it- last study years ago   Vertigo     Past Surgical History:  Procedure [place]   [place]   Procedure: Left Heart Cath and Coronary Angiography;  Surgeon: Peter M JMartinique MD;  [facility];  Service: Cardiovascular;  Laterality: N/A;   CHOLECYSTECTOMY  2001   CYSTOSCOPY W/ RETROGRADES Left 08/02/2015   Procedure: CYSTOSCOPY WITH LEFT RETROGRADE PYELOGRAM;  Surgeon: MFestus Aloe MD;  Location: [facility];  Service: Urology;  Laterality: Left;   CYSTOSCOPY WITH URETEROSCOPY, STONE BASKETRY AND STENT PLACEMENT Left 08/02/2015   Procedure:  SMitzi Daven[place];  Surgeon: MFestus Aloe MD;  Location: [facility];  Service: Urology;  Laterality: Left;   CYSTOSCOPY/URETEROSCOPY/HOLMIUM LASER/STENT PLACEMENT Left 08/02/2015   Procedure: CYSTOSCOPY/URETEROSCOPY/HOLMIUM LASER/STENT PLACEMENT-LEFT;  Surgeon: MFestus Aloe MD;  Location: [facility];  Service: Urology;  Laterality: Left;   HOLMIUM LASER APPLICATION Left 71/61/0960  Procedure: HOLMIUM LASER APPLICATION;  Surgeon: MFestus Aloe MD;  Location: [facility];  Service: Urology;  Laterality: Left;   REPLACEMENT ASCENDING AORTA N/A 03/03/2014   Procedure: REPAIR OF ASCENDING AORTIC DISSECTION;  Surgeon: EGrace Isaac MD;  [facility]  Service: Open Heart Surgery;  Laterality: N/A;    Family History  Problem Relation Age of Onset   Breast cancer Mother 673  Aortic dissection Mother 635  Hypertension Mother    Hypertension Father    Breast cancer Sister    Hypertension Sister    Throat cancer Brother        130bro living   Colon cancer Brother    Esophageal cancer Brother    Breast cancer Daughter    Hypertension Daughter    Liver  cancer Daughter    Colon cancer Daughter    Breast cancer Niece    Seizures Neg Hx    Pancreatic cancer Neg Hx    Rectal cancer Neg Hx    Stomach cancer Neg Hx     Social History   Socioeconomic History   Marital status: Divorced    Spouse name: Not on file   Number of children: 1   Years of education: Not on file   Highest education level: Not on file  Occupational History   Not on file  Tobacco Use   Smoking status: Never   Smokeless tobacco: Never   Tobacco comments:    separated spring  2013- lives with 1 dtr -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   Physical Exam: Vitals:   10/20/21 1024  BP: 138/72  Pulse: (Abnormal) 53   Body mass index is 35.63 kg/m. Gen:      No acute distress HEENT:  sclera anicteric Abd:      soft, non-tender; no palpable masses, no distension Ext:    No edema Neuro: alert and oriented x 3 Psych: normal mood and affect  Data Reviewed:  Reviewed labs, radiology imaging, old records and pertinent past GI work up   Assessment and Plan/Recommendations:  66 year old very pleasant female with history of CAD, hypertension, ascending aortic dissection, PE on chronic anticoagulation with Eliquis here with complaints of IBS with alternating constipation and diarrhea  Start Linzess 145 mcg daily for IBS constipation Increase  dietary fiber and water intake  Bile salt induced diarrhea intermittently, she is experiencing constipation with WelChol will decrease the dose to half tablet every other day and advised patient to hold if she is experiencing extended days with constipation  Due for surveillance colonoscopy January 2024  Return in 3 months  The patient was provided an opportunity to ask questions and all were answered. The patient agreed with the plan and demonstrated an understanding of the instructions.  Damaris Hippo , MD    CC: Nolene Ebbs, MD

## 2021-10-20 NOTE — Patient Instructions (Addendum)
You will be due for a recall colonoscopy in 01/2022. We will send you a reminder in the mail when it gets closer to that time. _____________________________________________________  We have given you samples of the following medication to take: Linzess 145 mcg daily  We have sent the following medications to your pharmacy for you to pick up at your convenience: Linzess 145 mc daily (call if dose does not work)  [employer] 1/2 tablet by mouth every other day as needed. Take 2-3 hours apart from all other medications. _____________________________________________________  Please follow up with Dr Silverio Decamp in 3 months.  _______________________________________________________  If you are age 39 or older, your body mass index should be between 23-30. Your Body mass index is 35.63 kg/m. If this is out of the aforementioned range listed, please consider follow up with your Primary Care Provider.  If you are age 37 or younger, your body mass index should be between 19-25. Your Body mass index is 35.63 kg/m. If this is out of the aformentioned range listed, please consider follow up with your Primary Care Provider.   ________________________________________________________  The [facility] providers would like to encourage you to use [facility] to communicate with providers for non-urgent requests or questions.  Due to long hold times on the telephone, sending your provider a message by [facility] may be a faster and more efficient way to get a response.  Please allow 48 business hours for a response.  Please remember that this is for non-urgent requests.  _______________________________________________________  Due to recent changes in healthcare laws, you may see the results of your imaging and laboratory studies on MyChart before your provider has had a chance to review them.  We understand that in some cases there may be results that are confusing or concerning to you. Not all laboratory results come back  in the same time frame and the provider may be waiting for multiple results in order to interpret others.  Please give [place] 48 hours in order for your provider to thoroughly review all the results before contacting the office for clarification of your results.

## 2021-11-10 ENCOUNTER — Other Ambulatory Visit: Payer: Self-pay | Admitting: Internal Medicine

## 2021-11-11 ENCOUNTER — Encounter: Payer: Self-pay | Admitting: Gastroenterology

## 2021-11-11 LAB — EXTRA LAV TOP TUBE

## 2021-11-11 LAB — BASIC METABOLIC PANEL WITH GFR
BUN: 14 mg/dL (ref 7–25)
CO2: 25 mmol/L (ref 20–32)
Calcium: 8.9 mg/dL (ref 8.6–10.4)
Chloride: 104 mmol/L (ref 98–110)
Creat: 0.92 mg/dL (ref 0.50–1.05)
Glucose, Bld: 91 mg/dL (ref 65–99)
Potassium: 3.5 mmol/L (ref 3.5–5.3)
Sodium: 141 mmol/L (ref 135–146)
eGFR: 68 mL/min/{1.73_m2} (ref >=60)

## 2022-05-06 ENCOUNTER — Other Ambulatory Visit: Payer: Self-pay | Admitting: Internal Medicine

## 2022-05-07 LAB — COMPLETE METABOLIC PANEL WITH GFR
AG Ratio: 1.5 (calc) (ref 1.0–2.5)
ALT: 8 U/L (ref 6–29)
AST: 11 U/L (ref 10–35)
Albumin: 3.7 g/dL (ref 3.6–5.1)
Alkaline phosphatase (APISO): 71 U/L (ref 37–153)
BUN: 15 mg/dL (ref 7–25)
CO2: 30 mmol/L (ref 20–32)
Calcium: 9.2 mg/dL (ref 8.6–10.4)
Chloride: 102 mmol/L (ref 98–110)
Creat: 0.96 mg/dL (ref 0.50–1.05)
Globulin: 2.5 g/dL (calc) (ref 1.9–3.7)
Glucose, Bld: 92 mg/dL (ref 65–99)
Potassium: 3.5 mmol/L (ref 3.5–5.3)
Sodium: 141 mmol/L (ref 135–146)
Total Bilirubin: 0.4 mg/dL (ref 0.2–1.2)
Total Protein: 6.2 g/dL (ref 6.1–8.1)
eGFR: 65 mL/min/{1.73_m2} (ref >=60)

## 2022-05-07 LAB — CBC
HCT: 40.5 % (ref 35.0–45.0)
Hemoglobin: 12.5 g/dL (ref 11.7–15.5)
MCH: 22.3 pg — ABNORMAL LOW (ref 27.0–33.0)
MCHC: 30.9 g/dL — ABNORMAL LOW (ref 32.0–36.0)
MCV: 72.2 fL — ABNORMAL LOW (ref 80.0–100.0)
MPV: 10.9 fL (ref 7.5–12.5)
Platelets: 203 10*3/uL (ref 140–400)
RBC: 5.61 10*6/uL — ABNORMAL HIGH (ref 3.80–5.10)
RDW: 14.8 % (ref 11.0–15.0)
WBC: 5.4 10*3/uL (ref 3.8–10.8)

## 2022-05-07 LAB — LIPID PANEL
Cholesterol: 139 mg/dL (ref <200)
HDL: 51 mg/dL (ref >=50)
LDL Cholesterol (Calc): 72 mg/dL (calc)
Non-HDL Cholesterol (Calc): 88 mg/dL (calc) (ref <130)
Total CHOL/HDL Ratio: 2.7 (calc) (ref <5.0)
Triglycerides: 84 mg/dL (ref <150)

## 2022-05-07 LAB — TSH: TSH: 1.16 mIU/L (ref 0.40–4.50)

## 2022-05-07 LAB — VITAMIN D 25 HYDROXY (VIT D DEFICIENCY, FRACTURES): Vit D, 25-Hydroxy: 26 ng/mL — ABNORMAL LOW (ref 30–100)

## 2022-06-24 ENCOUNTER — Other Ambulatory Visit: Payer: Self-pay | Admitting: Internal Medicine

## 2022-06-24 ENCOUNTER — Other Ambulatory Visit (HOSPITAL_COMMUNITY): Payer: Self-pay

## 2022-06-26 LAB — INFLUENZA A AND B AG, IMMUNOASSAY
INFLUENZA A ANTIGEN: NOT DETECTED
INFLUENZA B ANTIGEN: NOT DETECTED
MICRO NUMBER:: 15078108
SPECIMEN QUALITY:: ADEQUATE

## 2022-06-26 LAB — SARS-COV-2 RNA,(COVID-19) QUALITATIVE NAAT: SARS CoV2 RNA: DETECTED — AB

## 2022-08-13 ENCOUNTER — Encounter (HOSPITAL_BASED_OUTPATIENT_CLINIC_OR_DEPARTMENT_OTHER): Payer: Self-pay | Admitting: Emergency Medicine

## 2022-08-13 ENCOUNTER — Emergency Department (HOSPITAL_BASED_OUTPATIENT_CLINIC_OR_DEPARTMENT_OTHER)
Admission: EM | Admit: 2022-08-13 | Discharge: 2022-08-13 | Disposition: A | Discharge status: Home / Self Care | Payer: Medicare Other | Attending: Emergency Medicine | Admitting: Emergency Medicine

## 2022-08-13 ENCOUNTER — Other Ambulatory Visit: Payer: Self-pay

## 2022-08-13 ENCOUNTER — Emergency Department (HOSPITAL_BASED_OUTPATIENT_CLINIC_OR_DEPARTMENT_OTHER): Payer: Medicare Other | Admitting: Radiology

## 2022-08-13 DIAGNOSIS — Z79899 Other long term (current) drug therapy: Secondary | ICD-10-CM | POA: Diagnosis not present

## 2022-08-13 DIAGNOSIS — Z7901 Long term (current) use of anticoagulants: Secondary | ICD-10-CM | POA: Insufficient documentation

## 2022-08-13 DIAGNOSIS — Z7982 Long term (current) use of aspirin: Secondary | ICD-10-CM | POA: Insufficient documentation

## 2022-08-13 DIAGNOSIS — I2510 Atherosclerotic heart disease of native coronary artery without angina pectoris: Secondary | ICD-10-CM | POA: Insufficient documentation

## 2022-08-13 DIAGNOSIS — I10 Essential (primary) hypertension: Secondary | ICD-10-CM | POA: Diagnosis not present

## 2022-08-13 DIAGNOSIS — R079 Chest pain, unspecified: Secondary | ICD-10-CM | POA: Insufficient documentation

## 2022-08-13 LAB — CBC
HCT: 38.1 % (ref 36.0–46.0)
Hemoglobin: 12.3 g/dL (ref 12.0–15.0)
MCH: 23.1 pg — ABNORMAL LOW (ref 26.0–34.0)
MCHC: 32.3 g/dL (ref 30.0–36.0)
MCV: 71.5 fL — ABNORMAL LOW (ref 80.0–100.0)
Platelets: 193 10*3/uL (ref 150–400)
RBC: 5.33 MIL/uL — ABNORMAL HIGH (ref 3.87–5.11)
RDW: 15.7 % — ABNORMAL HIGH (ref 11.5–15.5)
WBC: 5.8 10*3/uL (ref 4.0–10.5)
nRBC: 0 % (ref 0.0–0.2)

## 2022-08-13 LAB — BASIC METABOLIC PANEL
Anion gap: 8 (ref 5–15)
BUN: 18 mg/dL (ref 8–23)
CO2: 30 mmol/L (ref 22–32)
Calcium: 8.7 mg/dL — ABNORMAL LOW (ref 8.9–10.3)
Chloride: 102 mmol/L (ref 98–111)
Creatinine, Ser: 1.1 mg/dL — ABNORMAL HIGH (ref 0.44–1.00)
GFR, Estimated: 55 mL/min — ABNORMAL LOW (ref >60)
Glucose, Bld: 122 mg/dL — ABNORMAL HIGH (ref 70–99)
Potassium: 3 mmol/L — ABNORMAL LOW (ref 3.5–5.1)
Sodium: 140 mmol/L (ref 135–145)

## 2022-08-13 LAB — TROPONIN I (HIGH SENSITIVITY)
Troponin I (High Sensitivity): 7 ng/L (ref <18)
Troponin I (High Sensitivity): 7 ng/L (ref <18)

## 2022-08-13 MED ORDER — POTASSIUM CHLORIDE CRYS ER 20 MEQ PO TBCR
40.0000 meq | EXTENDED_RELEASE_TABLET | Freq: Once | ORAL | Status: AC
  Administered 2022-08-13: 40 meq via ORAL
  Filled 2022-08-13: qty 2

## 2022-08-13 NOTE — ED Notes (Signed)
ED Provider at bedside. 

## 2022-08-13 NOTE — ED Notes (Signed)
 RN reviewed discharge instructions with pt. Pt verbalized understanding and had no further questions. VSS upon discharge.  

## 2022-08-13 NOTE — Discharge Instructions (Signed)
As we discussed, I would like for you to keep your appointment with your cardiologist.  If you start experiencing more frequent chest pain or your chest pain worsens I would like for you to come back for reevaluation.

## 2022-08-13 NOTE — ED Triage Notes (Signed)
Patient endorses chest heaviness that started an hour ago.  Patient reports it feels like someone is sitting on her chest.

## 2022-08-13 NOTE — ED Provider Notes (Signed)
[place] EMERGENCY DEPARTMENT AT [facility] Provider Note   CSN: [id number] Arrival date & time: 08/13/22  1448     History Chief Complaint  Patient presents with   Chest Pain    Alexis Wheeler is a 67 y.o. female patient with coronary artery disease, hypertension, hyperlipidemia, myocardial infarction on Eliquis who presents to the emergency department today with diffuse chest heaviness that started earlier today.  Patient was with her brother at the [place] he was getting a procedure done when she started having chest heaviness.  Pain was not radiating and it feels different from when she first had a heart attack but considering that she has had MI in the past she want to come get evaluated.  She denies any nausea, vomiting, diaphoresis, shortness of breath, dizziness, abdominal pain.   Chest Pain      Home Medications Prior to Admission medications   Medication Sig Start Date End Date Taking? Authorizing Provider  66440 Take 1 capsule by mouth once a week.    [provider]  acetaminophen (TYLENOL) 500 MG tablet Take 2 tablets (1,000 mg total) by mouth every 6 (six) hours as needed. 09/14/21   Carlisle Beers, FNP  amLODipine (NORVASC) 10 MG tablet Take 1 tablet (10 mg total) by mouth daily. 06/20/20   Quintella Reichert, MD  amLODipine (NORVASC) 10 MG tablet Take 1 tablet by mouth daily.    [provider]  apixaban (ELIQUIS) 5 MG TABS tablet Take 1 tablet by mouth 2 (two) times daily.    [provider]  aspirin EC 81 MG EC tablet Take 1 tablet (81 mg total) by mouth daily. 03/14/14   Collins, Gina L, PA-C  atorvastatin (LIPITOR) 40 MG tablet TAKE 1 TABLET BY MOUTH ONCE DAILY AT 6:00 IN THE  EVENING 06/20/20   Quintella Reichert, MD  carvedilol (COREG) 12.5 MG tablet Take 1 tablet (12.5 mg total) by mouth 2 (two) times daily. 06/20/20   Quintella Reichert, MD  carvedilol (COREG) 12.5 MG tablet Take 1 tablet by mouth 2 (two) times daily.    [provider]  colesevelam (WELCHOL) 625 MG tablet Take 1 tablet (625 mg total) by mouth every other day. As needed 10/20/21   Napoleon Form, MD  cyclobenzaprine (FLEXERIL) 10 MG tablet Take 1 tablet (10 mg total) by mouth 3 (three) times daily as needed for muscle spasms. 06/21/20   Roxy Horseman, PA-C  diclofenac Sodium (VOLTAREN) 1 % GEL Apply 2-4 g topically 4 (four) times daily as needed (pain). 12/26/18   [provider]  ELIQUIS 5 MG TABS tablet Take 5 mg by mouth 2 (two) times daily. 07/26/20   [provider]  ferrous sulfate 325 (65 FE) MG tablet Take 1 tablet (325 mg total) by mouth daily with breakfast. 04/04/20   Marinda Elk, MD  folic acid (FOLVITE) 1 MG tablet Take 1 mg by mouth daily.    [provider]  folic acid (FOLVITE) 1 MG tablet Take 1 tablet by mouth daily.    [provider]  linaclotide Karlene Einstein) 145 MCG CAPS capsule Take 1 capsule (145 mcg total) by mouth daily before breakfast. 10/20/21   Nandigam, Eleonore Chiquito, MD  loratadine (CLARITIN) 10 MG tablet Take 1 tablet (10 mg total) by mouth daily. 09/14/21   Carlisle Beers, FNP  losartan (COZAAR) 25 MG tablet Take 25 mg by mouth daily.    [provider]  losartan-hydrochlorothiazide (HYZAAR) 50-12.5 MG  tablet Take 1 tablet by mouth daily. 09/24/21   [provider]  Vitamin D, Ergocalciferol, (DRISDOL) 1.25 MG (50000 UNIT) CAPS capsule Take 50,000 Units by mouth once a week. 03/28/20   [provider]      Allergies    Hydralazine and Motrin [ibuprofen]    Review of Systems   Review of Systems  Cardiovascular:  Positive for chest pain.  All other systems reviewed and are negative.   Physical Exam Updated Vital Signs BP (!) 137/55   Pulse 62   Temp 97.8 F (36.6 C)   Resp 13   Ht 5\' 3"  (1.6 m)   Wt 88 kg   SpO2 100%   BMI 34.37 kg/m  Physical Exam Vitals and nursing note reviewed.  Constitutional:      General: She is not in  acute distress.    Appearance: Normal appearance.  HENT:     Head: Normocephalic and atraumatic.  Eyes:     General:        Right eye: No discharge.        Left eye: No discharge.  Cardiovascular:     Comments: Regular rate and rhythm.  S1/S2 are distinct without any evidence of murmur, rubs, or gallops.  Radial pulses are 2+ bilaterally.  Dorsalis pedis pulses are 2+ bilaterally.  No evidence of pedal edema. Pulmonary:     Comments: Clear to auscultation bilaterally.  Normal effort.  No respiratory distress.  No evidence of wheezes, rales, or rhonchi heard throughout. Chest:     Comments: Well healed sternotomy scar. No tenderness to palpation.  Abdominal:     General: Abdomen is flat. Bowel sounds are normal. There is no distension.     Tenderness: There is no abdominal tenderness. There is no guarding or rebound.  Musculoskeletal:        General: Normal range of motion.     Cervical back: Neck supple.  Skin:    General: Skin is warm and dry.     Findings: No rash.  Neurological:     General: No focal deficit present.     Mental Status: She is alert.  Psychiatric:        Mood and Affect: Mood normal.        Behavior: Behavior normal.     ED Results / Procedures / Treatments   Labs (all labs ordered are listed, but only abnormal results are displayed) Labs Reviewed  BASIC METABOLIC PANEL - Abnormal; Notable for the following components:      Result Value   Potassium 3.0 (*)    Glucose, Bld 122 (*)    Creatinine, Ser 1.10 (*)    Calcium 8.7 (*)    GFR, Estimated 55 (*)    All other components within normal limits  CBC - Abnormal; Notable for the following components:   RBC 5.33 (*)    MCV 71.5 (*)    MCH 23.1 (*)    RDW 15.7 (*)    All other components within normal limits  TROPONIN I (HIGH SENSITIVITY)  TROPONIN I (HIGH SENSITIVITY)    EKG EKG Interpretation Date/Time:  Thursday August 13 2022 14:55:28 EDT Ventricular Rate:  65 PR Interval:  168 QRS  Duration:  80 QT Interval:  432 QTC Calculation: 449 R Axis:   29  Text Interpretation: Normal sinus rhythm Cannot rule out Anterior infarct , age undetermined Abnormal ECG When compared with ECG of 01-Oct-2021 11:56, No significant change since last tracing Confirmed by Meridee Score (  [id number]) on 08/13/2022 2:59:45 PM  Radiology DG Chest 2 View  Result Date: 08/13/2022 CLINICAL DATA:  Chest pain EXAM: CHEST - 2 VIEW COMPARISON:  06/21/2020 FINDINGS: The lungs are clear without focal pneumonia, edema, pneumothorax or pleural effusion. Cardiopericardial silhouette is at upper limits of normal for size. No acute bony abnormality. Telemetry leads overlie the chest. IMPRESSION: Stable.  No acute findings. Electronically Signed   By: Kennith Center M.D.   On: 08/13/2022 15:59    Procedures Procedures    Medications Ordered in ED Medications  potassium chloride SA (KLOR-CON M) CR tablet 40 mEq (40 mEq Oral Given 08/13/22 1646)    ED Course/ Medical Decision Making/ A&P Clinical Course as of 08/13/22 1825  Thu Aug 13, 2022  1749 Troponin I (High Sensitivity) Initial and delta troponins are normal. [CF]  1749 CBC(!) No evidence of leukocytosis. [CF]  1749 Basic metabolic panel(!) Mild hypokalemia. Will replenish here. Creatinine slightly elevated.  [CF]  1752 DG Chest 2 View I personally ordered and interpreted this study and do not see any evidence of pneumonia or cardiomegaly. I do agree with the radiologist interpretation.  [CF]  1817 On reevaluation, patient states that her chest heaviness is improving.  I went over all labs and imaging with her at the bedside.  Patient comfortable going home.  She does have a cardiologist appointment this month.  Strict return precautions were discussed. [CF]    Clinical Course User Index [CF] Teressa Lower, PA-C   {   Click here for ABCD2, HEART and other calculators  Medical Decision Making SINAYA HAUSSLER is a 67 y.o. female patient who  presents to the emergency department today for further evaluation of chest heaviness.  Will plan to get chest pain labs, chest x-ray, and EKG to further assess.  Offered patient pain medication which she currently declined.  Chest pain is actively improving.  Patient is negative troponins x 2.  EKG reassuring.  She has an appointment with her cardiologist.  She will keep that appointment.  Strict return precaution were discussed.  Low suspicion for ACS at this time.  She is safe for discharge.  Amount and/or Complexity of Data Reviewed Labs: ordered. Decision-making details documented in ED Course. Radiology: ordered. Decision-making details documented in ED Course.  Risk Prescription drug management.    Final Clinical Impression(s) / ED Diagnoses Final diagnoses:  Chest pain, unspecified type    Rx / DC Orders ED Discharge Orders     None         Teressa Lower, [place] 08/13/22 1825    Tegeler, Canary Brim, MD 08/13/22 [phone]

## 2022-08-20 NOTE — Progress Notes (Signed)
Cardiology Office Note:    Date:  08/21/2022   ID:  Alexis Wheeler, DOB [birth year], MRN [id number]  PCP:  [name], MD  Cardiologist:  [name], MD    Referring MD: [name], MD   Chief Complaint  Patient presents with   Coronary Artery Disease   Hypertension   Hyperlipidemia     History of Present Illness:    Alexis Wheeler is a 67 y.o. female with a hx of ASCAD with cath 2017 showing 30% Ost RCA stenosis, 20% mid-LAD stenosis, and normal LV function.  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  She underwent replacement of the ascending aorta with resuspension of the AV with 30 mm Hemashield Platinum graft with R axillary artery cannulation by Dr. [name].  EF was 40-45% post op.  Follow up echo from 07/2014 showed improvement of her EF - now at 55 to 60%. She also has a history of hypertension, morbid obesity and OSA on CPAP.    In 2018 she had CTA when admitted with chest pain -- possible small PE versus scarring noted - she did not wish to be on blood thinner. No DVT on doppler study. She was not short of breath and clinically did not fit the picture for a PE. Earlier in 2020 she had lower extremity dopplers due to prior elevated d dimer and has DVT in the peroneal vein on the right. This had persisted and since below the knee and no PE - did not want anticoagulation.  She was hospitalized in March 2022 with acute saddle pulmonary embolism with large burden of lobar and distal embolus and more occlusive in the right pulmonary arteries, RVE and stable chronic aortic dissection with Bentall type ascending thoracic graft repair.    She is here today for followup and is doing well.  She did have an episode of CP last week that prompted an ER visit.  She was at the [place] with her brother who was getting a procedure done.  She started having chest heaviness.   She took some advil which eased it some but it persisted several hours and she went to the ER where her workup was normal except for low K+.  The discomfort did not radiated but did have some mild nausea.  No diaphoresis.  She has not had anything since then.  She denies any SOB, DOE, PND, orthopnea, LE edema, dizziness, palpitations or syncope. She is compliant with her meds and is tolerating meds with no SE.    She has not been using her CPAP.  Past Medical History:  Diagnosis Date   Anemia    childhood   Aortic insufficiency    mild to moderate by echo 07/2020   Arthritis    right knee   Ascending aortic dissection [facility])    s/p repair by Dr. [name].  2D echo 07/2020 with aortic root 41mm   Blood transfusion without reported diagnosis    with heart surgery-repair aorta   CAD (coronary artery disease)    a. 04/2015: cath showed 30% Ost RCA stenosis, 20% mid-LAD stenosis, and normal LV function.    Cataract    bilateral eyes, per pt.   Congestive dilated cardiomyopathy (HCC) 08/01/2014   DVT (deep venous thrombosis) (HCC)    right   GERD    HEMATURIA UNSPECIFIED    History  of kidney stones    HYPERLIPIDEMIA    HYPERTENSION    Morbid obesity (HCC)    Myocardial infarction (HCC)    01/30/2014   MYOSITIS    OSA on CPAP 05/10/2017   mild OSA with an AHI of 5.2/hr overall but severe during REM sleep with an AHI of 41.4/hr and underwent CPAP titration to 12cm H2O   Pulmonary embolism with acute cor pulmonale (HCC) 03/2020   Saddle embolism with acute cor pulmonale   Sleep apnea    cpap-  doesnt use it- last study years ago   Vertigo     Past Surgical History:  Procedure Laterality Date   ABDOMINAL HYSTERECTOMY  1983   CARDIAC CATHETERIZATION N/A 04/30/2015   Procedure: Left Heart Cath and Coronary Angiography;  Surgeon: Peter M Swaziland, MD;  Location: [facility] INVASIVE CV LAB;  Service: Cardiovascular;  Laterality: N/A;   CHOLECYSTECTOMY  2001   CYSTOSCOPY W/ RETROGRADES Left  08/02/2015   Procedure: CYSTOSCOPY WITH LEFT RETROGRADE PYELOGRAM;  Surgeon: Jerilee Field, MD;  Location: [facility] ORS;  Service: Urology;  Laterality: Left;   CYSTOSCOPY WITH URETEROSCOPY, STONE BASKETRY AND STENT PLACEMENT Left 08/02/2015   Procedure:  Geoffry Paradise ;  Surgeon: Jerilee Field, MD;  Location: [facility] ORS;  Service: Urology;  Laterality: Left;   CYSTOSCOPY/URETEROSCOPY/HOLMIUM LASER/STENT PLACEMENT Left 08/02/2015   Procedure: CYSTOSCOPY/URETEROSCOPY/HOLMIUM LASER/STENT PLACEMENT-LEFT;  Surgeon: Jerilee Field, MD;  Location: [facility] ORS;  Service: Urology;  Laterality: Left;   HOLMIUM LASER APPLICATION Left 08/02/2015   Procedure: HOLMIUM LASER APPLICATION;  Surgeon: Jerilee Field, MD;  Location: [facility];  Service: Urology;  Laterality: Left;   REPLACEMENT ASCENDING AORTA N/A 03/03/2014   Procedure: REPAIR OF ASCENDING AORTIC DISSECTION;  Surgeon: Delight Ovens, MD;  Location: [facility] OR;  Service: Open Heart Surgery;  Laterality: N/A;    Current Medications: Current Meds  Medication Sig   10894 Take 1 capsule by mouth once a week.   acetaminophen (TYLENOL) 500 MG tablet Take 2 tablets (1,000 mg total) by mouth every 6 (six) hours as needed.   amLODipine (NORVASC) 10 MG tablet Take 1 tablet (10 mg total) by mouth daily.   apixaban (ELIQUIS) 5 MG TABS tablet Take 1 tablet by mouth 2 (two) times daily.   atorvastatin (LIPITOR) 40 MG tablet TAKE 1 TABLET BY MOUTH ONCE DAILY AT 6:00 IN THE  EVENING   carvedilol (COREG) 12.5 MG tablet Take 1 tablet (12.5 mg total) by mouth 2 (two) times daily.   colesevelam (WELCHOL) 625 MG tablet Take 1 tablet (625 mg total) by mouth every other day. As needed   cyclobenzaprine (FLEXERIL) 10 MG tablet Take 1 tablet (10 mg total) by mouth 3 (three) times daily as needed for muscle spasms.   diclofenac Sodium (VOLTAREN) 1 % GEL Apply 2-4 g topically 4 (four) times daily as needed (pain).   ferrous sulfate 325 (65 FE) MG tablet Take 1 tablet (325 mg total) by  mouth daily with breakfast.   folic acid (FOLVITE) 1 MG tablet Take 1 mg by mouth daily.   linaclotide (LINZESS) 145 MCG CAPS capsule Take 1 capsule (145 mcg total) by mouth daily before breakfast.   loratadine (CLARITIN) 10 MG tablet Take 1 tablet (10 mg total) by mouth daily.   losartan (COZAAR) 25 MG tablet Take 25 mg by mouth daily.   Vitamin D, Ergocalciferol, (DRISDOL) 1.25 MG (50000 UNIT) CAPS capsule Take 50,000 Units by mouth once a week.     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  Vaping Use   Vaping status: Never Used  Substance and Sexual Activity   Alcohol use: No   Drug use: Not Currently    Types: Marijuana   Sexual activity: Never  Other Topics Concern   Not on file  Social History Narrative   Not on file   Social Determinants of Health   Financial Resource Strain: Not on file  Food Insecurity: Not on file  Transportation Needs: Not on file  Physical Activity: Not on file  Stress: Not on file  Social Connections: Unknown (05/27/2021)   Received from [employer]   Social Network    Social Network: Not on file     Family History: The patient's family history includes Aortic dissection (age of onset: 69) in her mother; Breast cancer in her daughter, niece, and sister; Breast cancer (age of onset: 32) in her mother; Colon cancer in her brother and daughter; Esophageal cancer in her brother; Hypertension in her daughter, father, mother, and sister; Liver cancer in her daughter; Throat cancer in her brother. There is no history of Seizures,  Pancreatic cancer, Rectal cancer, or Stomach cancer.  ROS:   Please see the history of present illness.    ROS  All other systems reviewed and negative.   EKGs/Labs/Other Studies Reviewed:    The following studies were reviewed today:   EKG:  NSR with LVH by voltage  Recent Labs: 10/08/2021: Magnesium 1.9 05/06/2022: ALT 8; TSH 1.16 08/13/2022: BUN 18; Creatinine, Ser 1.10; Hemoglobin 12.3; Platelets 193; Potassium 3.0; Sodium 140   Recent Lipid Panel    Component Value Date/Time   CHOL 139 05/06/2022 0000   CHOL 146 09/06/2018 1109   TRIG 84 05/06/2022 0000   HDL 51 05/06/2022 0000   HDL 53 09/06/2018 1109   CHOLHDL 2.7 05/06/2022 0000   VLDL 27 12/24/2015 1059   LDLCALC 72 05/06/2022 0000    Physical Exam:    VS:  BP 120/62   Pulse 63   Ht 5\' 2"  (1.575 m)   Wt 197 lb 6.4 oz (89.5 kg)   SpO2 99%   BMI 36.10 kg/m     Wt Readings from Last 3 Encounters:  08/21/22 197 lb 6.4 oz (89.5 kg)  08/13/22 194 lb (88 kg)  10/20/21 201 lb 2 oz (91.2 kg)     GEN: Well nourished, well developed in no acute distress HEENT: Normal NECK: No JVD; No carotid bruits LYMPHATICS: No lymphadenopathy CARDIAC:RRR, no murmurs, rubs, gallops RESPIRATORY:  Clear to auscultation without rales, wheezing or rhonchi  ABDOMEN: Soft, non-tender, non-distended MUSCULOSKELETAL:  No edema; No deformity  SKIN: Warm and dry NEUROLOGIC:  Alert and oriented x 3 PSYCHIATRIC:  Normal affect  ASSESSMENT:    1. Coronary artery disease due to lipid rich plaque   2. Benign essential HTN   3. Hyperlipidemia with target LDL less than 70   4. Ascending aortic dissection (HCC)   5. History of pulmonary embolism  PLAN:    In order of problems listed above:   ASCAD  -nonobstructive by cardiac cath in 2017 showing 30% Ost RCA stenosis, 20% mid-LAD stenosis.   -She had an episode of chest pressure that was new for her and got better with NSAIDs.  Workup in ER was normal.   -I am going to get a  Lexiscan myoview to rule out ischemia -Informed Consent   Shared Decision Making/Informed Consent The risks [chest pain, shortness of breath, cardiac arrhythmias, dizziness, blood pressure fluctuations, myocardial infarction, stroke/transient ischemic attack, nausea, vomiting, allergic reaction, radiation exposure, metallic taste sensation and life-threatening complications (estimated to be 1 in 10,000)], benefits (risk stratification, diagnosing coronary artery disease, treatment guidance) and alternatives of a nuclear stress test were discussed in detail with Ms. Erwin and she agrees to proceed. -Continue drug management with amlodipine 10 mg daily, aspirin 81 mg daily, carvedilol 12.5 mg twice daily, atorvastatin 40 mg daily with as needed refills  Hypertension  -BP is adequately controlled on exam today -Continue prescription drug management with amlodipine 10 mg daily, carvedilol 12.5 mg twice daily, losartan 25 mg daily with as needed refills -I have personally reviewed and interpreted outside labs performed by patient's PCP which showed SCr 1.1 and K+ 3 on 08/13/2022 -repeat BMET to check K+  Hyperlipidemia with LDL  -LDL goal < 70 -I have personally reviewed and interpreted outside labs performed by patient's PCP which showed LDL 72 and HDL 51 on 05/06/2022 -Continue description drug management with atorvastatin 40 mg daily with as needed refills   Ascending aortic dissection  -s/p repair and followed by CVTS -BP is stable  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  Concerns regarding medicines are  outlined above.  Orders Placed This Encounter  Procedures   EKG 12-Lead    No orders of the defined types were placed in this encounter.    Signed, [name], MD  08/21/2022 9:10 AM    [facility]

## 2022-08-21 ENCOUNTER — Other Ambulatory Visit: Payer: Self-pay

## 2022-08-21 ENCOUNTER — Ambulatory Visit: Payer: Medicare Other | Attending: Cardiology | Admitting: Cardiology

## 2022-08-21 ENCOUNTER — Encounter: Payer: Self-pay | Admitting: Cardiology

## 2022-08-21 ENCOUNTER — Other Ambulatory Visit: Payer: Self-pay | Admitting: Gastroenterology

## 2022-08-21 VITALS — BP 120/62 | HR 63 | Ht 62.0 in | Wt 197.4 lb

## 2022-08-21 DIAGNOSIS — I2583 Coronary atherosclerosis due to lipid rich plaque: Secondary | ICD-10-CM

## 2022-08-21 DIAGNOSIS — Z79899 Other long term (current) drug therapy: Secondary | ICD-10-CM

## 2022-08-21 DIAGNOSIS — I2510 Atherosclerotic heart disease of native coronary artery without angina pectoris: Secondary | ICD-10-CM | POA: Diagnosis not present

## 2022-08-21 DIAGNOSIS — Z86711 Personal history of pulmonary embolism: Secondary | ICD-10-CM

## 2022-08-21 DIAGNOSIS — I7101 Dissection of ascending aorta: Secondary | ICD-10-CM

## 2022-08-21 DIAGNOSIS — I10 Essential (primary) hypertension: Secondary | ICD-10-CM

## 2022-08-21 DIAGNOSIS — R079 Chest pain, unspecified: Secondary | ICD-10-CM

## 2022-08-21 DIAGNOSIS — E785 Hyperlipidemia, unspecified: Secondary | ICD-10-CM

## 2022-08-21 LAB — BASIC METABOLIC PANEL
BUN/Creatinine Ratio: 15 (ref 12–28)
BUN: 16 mg/dL (ref 8–27)
CO2: 27 mmol/L (ref 20–29)
Calcium: 8.9 mg/dL (ref 8.7–10.3)
Chloride: 103 mmol/L (ref 96–106)
Creatinine, Ser: 1.05 mg/dL — ABNORMAL HIGH (ref 0.57–1.00)
Glucose: 117 mg/dL — ABNORMAL HIGH (ref 70–99)
Potassium: 3.2 mmol/L — ABNORMAL LOW (ref 3.5–5.2)
Sodium: 142 mmol/L (ref 134–144)
eGFR: 58 mL/min/{1.73_m2} — ABNORMAL LOW (ref >59)

## 2022-08-21 NOTE — Addendum Note (Signed)
Addended by: Luellen Pucker on: 08/21/2022 09:17 AM   Modules accepted: Orders

## 2022-08-21 NOTE — Addendum Note (Signed)
Addended by: Luellen Pucker on: 08/21/2022 09:18 AM   Modules accepted: Orders

## 2022-08-21 NOTE — Patient Instructions (Signed)
Medication Instructions:  Your physician recommends that you continue on your current medications as directed. Please refer to the Current Medication list given to you today.  *If you need a refill on your cardiac medications before your next appointment, please call your pharmacy*   Lab Work: Please complete a BMET in our lab before you leave.  If you have labs (blood work) drawn today and your tests are completely normal, you will receive your results only by: MyChart Message (if you have MyChart) OR A paper copy in the mail If you have any lab test that is abnormal or we need to change your treatment, we will call you to review the results.   Testing/Procedures: Your physician has requested that you have an exercise tolerance test. For further information please visit [email or web address] Please also follow instruction sheet, as given.  Your physician has requested that you have an echocardiogram. Echocardiography is a painless test that uses sound waves to create images of your heart. It provides your doctor with information about the size and shape of your heart and how well your heart's chambers and valves are working. This procedure takes approximately one hour. There are no restrictions for this procedure. Please do NOT wear cologne, perfume, aftershave, or lotions (deodorant is allowed). Please arrive 15 minutes prior to your appointment time.   Follow-Up: At [facility], you and your health needs are our priority.  As part of our continuing mission to provide you with exceptional heart care, we have created designated Provider Care Teams.  These Care Teams include your primary Cardiologist (physician) and Advanced Practice Providers (APPs -  Physician Assistants and Nurse Practitioners) who all work together to provide you with the care you need, when you need it.  We recommend signing up for the patient portal called "MyChart".  Sign up information is provided on this  After Visit Summary.  MyChart is used to connect with patients for Virtual Visits (Telemedicine).  Patients are able to view lab/test results, encounter notes, upcoming appointments, etc.  Non-urgent messages can be sent to your provider as well.   To learn more about what you can do with MyChart, go to [email or web address].    Your next appointment:   1 year(s)  Provider:   [name], MD

## 2022-08-24 ENCOUNTER — Telehealth: Payer: Self-pay

## 2022-08-24 DIAGNOSIS — E876 Hypokalemia: Secondary | ICD-10-CM

## 2022-08-24 MED ORDER — POTASSIUM CHLORIDE CRYS ER 20 MEQ PO TBCR: 40.0000 meq | EXTENDED_RELEASE_TABLET | Freq: Once | ORAL | 0 refills | Status: DC

## 2022-08-24 NOTE — Telephone Encounter (Signed)
The patient has been notified of the result and verbalized understanding.  All questions (if any) were answered. Arvid Right , RN 08/24/2022 5:29 PM  Pt is not at home does not have meds in front of her.  Pt reports is taking losartan  25 mg PO every day.  Is not taking any diuretics.  Advised to take Potassium 40 mEq pt request not to send med into pharmacy has medication at home.  Advised pt to look at medication strength on bottle to ensure takes 40 mEq. F/u lab appointment scheduled for 08/31/22.  Pt expresses understanding.   Called [facility] pharmacy per MD request.  Medications on file for pt did not include diuretic.  Pt was dispensed a 90 day supply of Potassium 20 mEq in February.  Will route to primary RN and MD to f/u.

## 2022-08-24 NOTE — Addendum Note (Signed)
Addended by: Quintella Reichert on: 08/24/2022 09:56 AM   Modules accepted: Orders

## 2022-08-24 NOTE — Telephone Encounter (Signed)
-----   Message from [name] sent at 08/22/2022  3:45 PM EDT ----- K+ low again after being 3 a week ago in ER.  Please give Kdur once and then get a BMET in 1 week.  She did not think she was taking Losartan HCT and told me she was only taking Losartan but unclear why K+ is persistently low.  Please call patient and go over all her meds and then call pharmacy to double check that she is not on any diuretic

## 2022-08-31 ENCOUNTER — Ambulatory Visit: Payer: Medicare Other | Attending: Cardiology

## 2022-08-31 DIAGNOSIS — E876 Hypokalemia: Secondary | ICD-10-CM

## 2022-09-01 ENCOUNTER — Other Ambulatory Visit: Payer: Self-pay

## 2022-09-01 ENCOUNTER — Other Ambulatory Visit: Payer: Medicare Other

## 2022-09-01 LAB — BASIC METABOLIC PANEL
BUN/Creatinine Ratio: 15 (ref 12–28)
BUN: 15 mg/dL (ref 8–27)
CO2: 24 mmol/L (ref 20–29)
Calcium: 8.8 mg/dL (ref 8.7–10.3)
Chloride: 100 mmol/L (ref 96–106)
Creatinine, Ser: 1.03 mg/dL — ABNORMAL HIGH (ref 0.57–1.00)
Glucose: 123 mg/dL — ABNORMAL HIGH (ref 70–99)
Potassium: 3.2 mmol/L — ABNORMAL LOW (ref 3.5–5.2)
Sodium: 140 mmol/L (ref 134–144)
eGFR: 60 mL/min/{1.73_m2} (ref >59)

## 2022-09-01 NOTE — Telephone Encounter (Signed)
Spoke to patient, advised that K+ is still low at 3.2. Patient states she is taking 20 "mg" of K+, advised patient that orders state to take 40 MEq. Patient states "I don't have my meds in front of me so I don't know if any of that is right."    Call to [facility], spoke to [place]. Asked if patient had any diuretics filled there. Vicente Serene states that losartan-hydrochlorathiazide was filled/picked up on 08/12/22 and was prescribed by Dr. Concepcion Elk.   Call to [facility], spoke to [place]  who states that no diuretics are on file for patient.  Call to Dr. Albertina Parr office advising that K+ has been low at 3.2 for several weeks while patient has been taking diuretic and has also been taking K+ supplement incorrectly at lower dose. Spoke to [place], gave callback # and advised that Dr. [name] and I will be in office on Thursday if they want to contact us directly.   Forwarded responses to Dr. [name].

## 2022-09-03 NOTE — Telephone Encounter (Signed)
Call to patient, no answer, left detailed message listed on DPR explaining that patient needs to take Kdur 40 MEq BID x 2 doses and then take 40 Meq  daily. Explained I made her an appointment with Dr. Albertina Parr office for 09/09/22 at 3:30 at [facility] office and that she needs to have a BMET at this appointment.   Call to Dtr Alexis Wheeler (DPR), no answer, left message asking her to call our office.  I was able able to speak with sister Alexis Wheeler) to explain increase in K+ dose and gave time/date/location of appointment with Dr. Concepcion Elk. Alexis Wheeler verbalizes agreement to ensure patient can either make this appointment or calls Dr. Albertina Parr office to change it.

## 2022-09-04 ENCOUNTER — Other Ambulatory Visit: Payer: Self-pay | Admitting: Internal Medicine

## 2022-09-05 LAB — BASIC METABOLIC PANEL WITH GFR
BUN: 17 mg/dL (ref 7–25)
CO2: 28 mmol/L (ref 20–32)
Calcium: 8.9 mg/dL (ref 8.6–10.4)
Chloride: 105 mmol/L (ref 98–110)
Creat: 0.86 mg/dL (ref 0.50–1.05)
Glucose, Bld: 91 mg/dL (ref 65–99)
Potassium: 3.4 mmol/L — ABNORMAL LOW (ref 3.5–5.3)
Sodium: 142 mmol/L (ref 135–146)
eGFR: 74 mL/min/{1.73_m2} (ref >=60)

## 2022-09-05 LAB — EXTRA LAV TOP TUBE

## 2022-09-05 LAB — MAGNESIUM: Magnesium: 1.9 mg/dL (ref 1.5–2.5)

## 2022-09-07 NOTE — Telephone Encounter (Signed)
Call to patient to try to give info about appt w/ Dr. Concepcion Elk, no answer. Left message asking recipient to call our office.

## 2022-09-08 ENCOUNTER — Ambulatory Visit (HOSPITAL_COMMUNITY): Payer: Medicare Other | Attending: Cardiology

## 2022-09-08 ENCOUNTER — Ambulatory Visit (INDEPENDENT_AMBULATORY_CARE_PROVIDER_SITE_OTHER): Payer: Medicare Other

## 2022-09-08 DIAGNOSIS — I2583 Coronary atherosclerosis due to lipid rich plaque: Secondary | ICD-10-CM | POA: Diagnosis present

## 2022-09-08 DIAGNOSIS — I7101 Dissection of ascending aorta: Secondary | ICD-10-CM | POA: Insufficient documentation

## 2022-09-08 DIAGNOSIS — I2510 Atherosclerotic heart disease of native coronary artery without angina pectoris: Secondary | ICD-10-CM

## 2022-09-08 LAB — EXERCISE TOLERANCE TEST
Angina Index: 0
Duke Treadmill Score: 2
Estimated workload: 4.6
Exercise duration (min): 2 min
Exercise duration (sec): 22 s
MPHR: 153 {beats}/min
Peak HR: 78 {beats}/min
Percent HR: 50 %
RPE: 16
Rest HR: 53 {beats}/min
ST Depression (mm): 0 mm

## 2022-09-08 LAB — ECHOCARDIOGRAM COMPLETE
Area-P 1/2: 2.66 cm2
P 1/2 time: 354 msec
S' Lateral: 3.45 cm

## 2022-09-10 ENCOUNTER — Telehealth: Payer: Self-pay

## 2022-09-10 ENCOUNTER — Encounter: Payer: Self-pay | Admitting: Cardiology

## 2022-09-10 NOTE — Telephone Encounter (Signed)
Called patient to discuss echo which showed normal heart function EF 55 to 60% with mild right ventricular enlargement and mild left atrial enlargement.  Also advised there is moderate to severe aortic regurgitation with stable dissection of the descending aorta and stable aortic root repair.  Advised Dr. [name] would like her to be seen by one of the PAs to get set up to get a TEE done for further assessment of the aortic valve-patient verbalizes understanding and agrees to plan. Appt scheduled for 09/22/22 with Inda Coke, NP.

## 2022-09-10 NOTE — Telephone Encounter (Signed)
-----   Message from [name] sent at 09/10/2022 10:18 AM EDT ----- Echo showed normal heart function EF 55 to 60% with mild right ventricular enlargement and mild left atrial enlargement.  There is moderate to severe aortic regurgitation with stable dissection of the descending aorta and stable aortic root repair.  I would like her to be seen by one of the PAs to get set up to get a TEE done for further assessment of the aortic valve

## 2022-09-18 ENCOUNTER — Telehealth: Payer: Self-pay

## 2022-09-18 DIAGNOSIS — R079 Chest pain, unspecified: Secondary | ICD-10-CM

## 2022-09-18 NOTE — Telephone Encounter (Signed)
Call to patient to review that unfortunately exercise stress test was submaximal. Patient  agrees to [employer]. Orders placed.

## 2022-09-18 NOTE — Telephone Encounter (Signed)
-----   Message from [name] sent at 09/08/2022  4:13 PM EDT ----- Submaximal stress test.  Please order Steffanie Dunn ----- Message ----- From: Chilton Si, MD Sent: 09/08/2022   4:06 PM EDT To: Quintella Reichert, MD

## 2022-09-18 NOTE — Addendum Note (Signed)
Addended by: Luellen Pucker on: 09/18/2022 05:59 PM   Modules accepted: Orders

## 2022-09-21 NOTE — H&P (View-Only) (Signed)
Cardiology Office Note    Patient Name: Alexis Wheeler Date of Encounter: 09/21/2022  Primary Care Provider:  [name], MD Primary Cardiologist:  [name], MD Primary Electrophysiologist: None   Past Medical History    Past Medical History:  Diagnosis Date   Anemia    childhood   Aortic insufficiency    Moderate to severe by echo 08/2022   Arthritis    right knee   Ascending aortic dissection [facility])    s/p repair by Dr. [name].  2D echo 07/2020 with aortic root 41mm   Blood transfusion without reported diagnosis    with heart surgery-repair aorta   CAD (coronary artery disease)    a. 04/2015: cath showed 30% Ost RCA stenosis, 20% mid-LAD stenosis, and normal LV function.    Cataract    bilateral eyes, per pt.   Congestive dilated cardiomyopathy (HCC) 08/01/2014   DVT (deep venous thrombosis) (HCC)    right   GERD    HEMATURIA UNSPECIFIED    History of kidney stones    HYPERLIPIDEMIA    HYPERTENSION    Morbid obesity (HCC)    Myocardial infarction (HCC)    01/30/2014   MYOSITIS    OSA on CPAP 05/10/2017   mild OSA with an AHI of 5.2/hr overall but severe during REM sleep with an AHI of 41.4/hr and underwent CPAP titration to 12cm H2O   Pulmonary embolism with acute cor pulmonale (HCC) 03/2020   Saddle embolism with acute cor pulmonale   Sleep apnea    cpap-  doesnt use it- last study years ago   Vertigo     History of Present Illness  Alexis Wheeler is a 67 y.o. 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  Ms. 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  She underwent replacement with resuspension of the aortic valve with 30 mm Hemashield Platinum graft with right axillary artery cannulation and hypothermic circulatory arrest.  2D echo was performed which revealed mild LV systolic dysfunction with EF 40-45%.  She had a LHC completed in 2017 that showed 30% ostial RCA stenosis with 20% mid LAD stenosis.  She underwent cardiac CTA for chest pain in 2018 that showed small PE and revealed no DVT on Doppler study.  In 2020 she underwent lower extremity Doppler due to elevated D-dimer and was found to have DVT in peroneal vein.  She was hospitalized 03/2020 with acute saddle pulmonary embolism and was started on lifelong Eliquis.  She was last seen by Dr. [name] on 08/21/22 and reported an episode of chest pain that prompted an ED visit with normal workup.  She underwent a ETT that showed poor exercise capacity with no evidence of ST deviation.  2D echo was also ordered that showed EF of 55 to 60% with mild RV enlargement and LAE with moderate to severe aortic regurg stable dissection of the descending aorta and stable room.  Patient will need a TEE for further assessment and diagnosis of aortic valve.  During today's visit the patient reports that she has been doing well but experiencing increased fatigue occasional episodes of chest discomfort with increased activity.  She has been otherwise doing well and blood pressure today was 132/58 and heart rate was 57 bpm.  She reports compliance with her current medications and denies any adverse reactions.  During today's visit we discussed the pathophysiology of aortic regurgitation and reviewed her most recent echo results.  Patient had all questions answered to her satisfaction.  Patient denies chest pain, palpitations, dyspnea, PND, orthopnea, nausea, vomiting, dizziness, syncope, edema, weight gain, or early satiety.   Review  of Systems  Please see the history of present illness.    All other systems reviewed and are otherwise negative except as noted above.  Physical Exam    Wt Readings from Last 3 Encounters:  08/21/22 197 lb 6.4 oz (89.5 kg)  08/13/22 194 lb (88 kg)  10/20/21 201 lb 2 oz (91.2 kg)   ZO:XWRUE were no vitals filed for this visit.,There is no height or weight on file to calculate BMI. GEN: Well nourished, well developed in no acute distress Neck: No JVD; No carotid bruits Pulmonary: Clear to auscultation without rales, wheezing or rhonchi  Cardiovascular: Normal rate. Regular rhythm. Normal S1. Normal S2.   Murmurs: There is no murmur.  ABDOMEN: Soft, non-tender, non-distended EXTREMITIES:  No edema; No deformity   EKG/LABS/ Recent Cardiac Studies   ECG personally reviewed by me today -none completed today  Risk Assessment/Calculations:          Lab Results  Component Value Date   WBC 5.8 08/13/2022   HGB 12.3 08/13/2022   HCT 38.1 08/13/2022   MCV 71.5 (L) 08/13/2022   PLT 193 08/13/2022   Lab Results  Component Value Date   CREATININE 0.86 09/04/2022   BUN 17 09/04/2022   NA 142 09/04/2022   K 3.4 (L) 09/04/2022   CL 105 09/04/2022   CO2 28 09/04/2022   Lab Results  Component Value Date   CHOL 139 05/06/2022   HDL 51 05/06/2022   LDLCALC 72 05/06/2022   TRIG 84 05/06/2022   CHOLHDL 2.7 05/06/2022    Lab Results  Component Value Date   HGBA1C 6.0 (H) 08/31/2016   Assessment & Plan    1.  Nonrheumatic aortic regurgitation: -2D echo recently completed showing EF of 55 to 60% with moderate to severe aortic regurg with stable dissection and stable aortic repair patient will bouts of chest pain. -Patient reports fatigue and chest pain with activity. -Continue current GDMT with carvedilol 12.5 mg twice daily and losartan 25 mg daily. -Patient will undergo TEE per Dr. [name] -CBC today  2.  Coronary artery disease: -s/p LHC completed 2017 showing minimal  nonobstructive disease and ETT completed with poor exercise response. -Today patient reports chest discomfort that resolves with rest and only occurs with activity. -Continue current GDMT with carvedilol 12.5 mg twice daily, losartan 25 mg daily, Lipitor 40 mg daily WelChol 625 mg daily  3.  Essential hypertension: -Patient's blood pressure was controlled at 132/58 -Continue Norvasc 10 mg daily, carvedilol 12.5 mg twice daily, losartan 25 mg daily  4.  Ascending aortic dissection: -s/p AR repair with 30 mm Hemashield platinum graft 02/2014  5.  Chronic DVT and saddle PE: -Continue Eliquis 5 mg twice daily  Disposition: Follow-up with [name], MD or APP in 1 months Informed Consent   Shared Decision Making/Informed Consent The risks [esophageal damage, perforation (1:10,000 risk), bleeding, pharyngeal  hematoma as well as other potential complications associated with conscious sedation including aspiration, arrhythmia, respiratory failure and death], benefits (treatment guidance and diagnostic support) and alternatives of a transesophageal echocardiogram were discussed in detail with Ms. Gaymon and she is willing to proceed.       Signed, Napoleon Form, Leodis Rains, NP 09/21/2022, 5:16 PM [facility]

## 2022-09-21 NOTE — Progress Notes (Unsigned)
Cardiology Office Note    Patient Name: Alexis Wheeler Date of Encounter: 09/21/2022  Primary Care Provider:  [name], MD Primary Cardiologist:  [name], MD Primary Electrophysiologist: None   Past Medical History    Past Medical History:  Diagnosis Date   Anemia    childhood   Aortic insufficiency    Moderate to severe by echo 08/2022   Arthritis    right knee   Ascending aortic dissection [facility])    s/p repair by Dr. [name].  2D echo 07/2020 with aortic root 41mm   Blood transfusion without reported diagnosis    with heart surgery-repair aorta   CAD (coronary artery disease)    a. 04/2015: cath showed 30% Ost RCA stenosis, 20% mid-LAD stenosis, and normal LV function.    Cataract    bilateral eyes, per pt.   Congestive dilated cardiomyopathy (HCC) 08/01/2014   DVT (deep venous thrombosis) (HCC)    right   GERD    HEMATURIA UNSPECIFIED    History of kidney stones    HYPERLIPIDEMIA    HYPERTENSION    Morbid obesity (HCC)    Myocardial infarction (HCC)    01/30/2014   MYOSITIS    OSA on CPAP 05/10/2017   mild OSA with an AHI of 5.2/hr overall but severe during REM sleep with an AHI of 41.4/hr and underwent CPAP titration to 12cm H2O   Pulmonary embolism with acute cor pulmonale (HCC) 03/2020   Saddle embolism with acute cor pulmonale   Sleep apnea    cpap-  doesnt use it- last study years ago   Vertigo     History of Present Illness  Alexis Wheeler is a 67 y.o. 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  She underwent replacement with resuspension of the aortic valve with 30 mm Hemashield Platinum graft with right axillary artery cannulation and hypothermic circulatory arrest.  2D echo was performed which revealed mild LV systolic dysfunction with EF 40-45%.  She had a LHC completed in 2017 that showed 30% ostial RCA stenosis with 20% mid LAD stenosis.  She underwent cardiac CTA for chest pain in 2018 that showed small PE and revealed no DVT on Doppler study.  In 2020 she underwent lower extremity Doppler due to elevated D-dimer and was found to have DVT in peroneal vein.  She was hospitalized 03/2020 with acute saddle pulmonary embolism and was started on lifelong Eliquis.  She was last seen by Dr. [name] on 08/21/22 and reported an episode of chest pain that prompted an ED visit with normal workup.  She underwent a ETT that showed poor exercise capacity with no evidence of ST deviation.  2D echo was also ordered that showed EF of 55 to 60% with mild RV enlargement and LAE with moderate to severe aortic regurg stable dissection of the descending aorta and stable room.  Patient will need a TEE for further assessment and diagnosis of aortic valve.  During today's visit the patient reports*** .  Patient denies chest pain, palpitations, dyspnea, PND, orthopnea, nausea, vomiting, dizziness, syncope, edema, weight gain, or early satiety.  ***Notes:  Review of Systems  Please see the history of present illness.    All other systems reviewed and are otherwise negative except as noted above.  Physical Exam    Wt Readings from Last 3 Encounters:  08/21/22 197 lb 6.4 oz (89.5 kg)  08/13/22 194 lb (88 kg)  10/20/21 201 lb 2 oz (91.2 kg)   WJ:XBJYN were no vitals filed for this visit.,There is no height or weight on file to calculate BMI. GEN: Well nourished, well developed in no acute distress Neck: No JVD; No  carotid bruits Pulmonary: Clear to auscultation without rales, wheezing or rhonchi  Cardiovascular: Normal rate. Regular rhythm. Normal S1. Normal S2.   Murmurs: There is no murmur.  ABDOMEN: Soft, non-tender, non-distended EXTREMITIES:  No edema; No deformity   EKG/LABS/ Recent Cardiac Studies   ECG personally reviewed by me today - ***  Risk Assessment/Calculations:   {Does this patient have ATRIAL FIBRILLATION?:[id number]}      Lab Results  Component Value Date   WBC 5.8 08/13/2022   HGB 12.3 08/13/2022   HCT 38.1 08/13/2022   MCV 71.5 (L) 08/13/2022   PLT 193 08/13/2022   Lab Results  Component Value Date   CREATININE 0.86 09/04/2022   BUN 17 09/04/2022   NA 142 09/04/2022   K 3.4 (L) 09/04/2022   CL 105 09/04/2022   CO2 28 09/04/2022   Lab Results  Component Value Date   CHOL 139 05/06/2022   HDL 51 05/06/2022   LDLCALC 72 05/06/2022   TRIG 84 05/06/2022   CHOLHDL 2.7 05/06/2022    Lab Results  Component Value Date   HGBA1C 6.0 (H) 08/31/2016   Assessment & Plan    1.  Nonrheumatic aortic regurgitation: -2D echo recently completed showing EF of 55 to 60% with moderate to severe aortic regurg with stable dissection and stable aortic repair -Patient will undergo***  2.  Coronary artery disease: -s/p LHC completed 2017 showing minimal nonobstructive disease and ETT completed with poor exercise response. -Today patient reports***  3.  Essential hypertension  4.  Ascending aortic dissection: -s/p AR repair with 30 mm Hemashield platinum graft 02/2014  5.  Chronic DVT and saddle PE: -Continue Eliquis 5 mg twice daily      Disposition: Follow-up with [name], MD or APP in *** months {Are you ordering a CV Procedure (e.g. stress test, cath, DCCV, TEE, etc)?   Press F2        :[id number]}   Signed, Napoleon Form, Leodis Rains, NP 09/21/2022, 5:16 PM [facility]

## 2022-09-22 ENCOUNTER — Ambulatory Visit: Payer: Medicare Other | Attending: Nurse Practitioner | Admitting: Nurse Practitioner

## 2022-09-22 ENCOUNTER — Encounter: Payer: Self-pay | Admitting: Nurse Practitioner

## 2022-09-22 VITALS — BP 132/58 | HR 57 | Ht 63.0 in | Wt 196.6 lb

## 2022-09-22 DIAGNOSIS — I10 Essential (primary) hypertension: Secondary | ICD-10-CM

## 2022-09-22 DIAGNOSIS — I7101 Dissection of ascending aorta: Secondary | ICD-10-CM | POA: Diagnosis not present

## 2022-09-22 DIAGNOSIS — I351 Nonrheumatic aortic (valve) insufficiency: Secondary | ICD-10-CM | POA: Diagnosis not present

## 2022-09-22 DIAGNOSIS — I2583 Coronary atherosclerosis due to lipid rich plaque: Secondary | ICD-10-CM

## 2022-09-22 DIAGNOSIS — Z86711 Personal history of pulmonary embolism: Secondary | ICD-10-CM

## 2022-09-22 DIAGNOSIS — I2510 Atherosclerotic heart disease of native coronary artery without angina pectoris: Secondary | ICD-10-CM

## 2022-09-22 NOTE — Patient Instructions (Addendum)
Medication Instructions:  Your physician recommends that you continue on your current medications as directed. Please refer to the Current Medication list given to you today. *If you need a refill on your cardiac medications before your next appointment, please call your pharmacy*   Lab Work: [facility] If you have labs (blood work) drawn today and your tests are completely normal, you will receive your results only by: MyChart Message (if you have MyChart) OR A paper copy in the mail If you have any lab test that is abnormal or we need to change your treatment, we will call you to review the results.   Testing/Procedures: Your physician has requested that you have a TEE. During a TEE, sound waves are used to create images of your heart. It provides your doctor with information about the size and shape of your heart and how well your heart's chambers and valves are working. In this test, a transducer is attached to the end of a flexible tube that's guided down your throat and into your esophagus (the tube leading from you mouth to your stomach) to get a more detailed image of your heart. You are not awake for the procedure. Please see the instruction sheet given to you today. For further information please visit [email or web address]   Follow-Up: At [facility], you and your health needs are our priority.  As part of our continuing mission to provide you with exceptional heart care, we have created designated Provider Care Teams.  These Care Teams include your primary Cardiologist (physician) and Advanced Practice Providers (APPs -  Physician Assistants and Nurse Practitioners) who all work together to provide you with the care you need, when you need it.  We recommend signing up for the patient portal called "MyChart".  Sign up information is provided on this After Visit Summary.  MyChart is used to connect with patients for Virtual Visits (Telemedicine).  Patients are able to view  lab/test results, encounter notes, upcoming appointments, etc.  Non-urgent messages can be sent to your provider as well.   To learn more about what you can do with MyChart, go to [email or web address].    Your next appointment:   1 month(s)  Provider:   [name], MD     Other Instructions  Check your blood pressure daily for 2 weeks, then contact the office with your readings.  Make sure to check 2 hours after your medications.   AVOID these things for 30 minutes before checking your blood pressure: No Drinking caffeine. No Drinking alcohol. No Eating. No Smoking. No Exercising.  Five minutes before checking your blood pressure: Pee. Sit in a dining chair. Avoid sitting in a soft couch or armchair. Be quiet. Do not talk.       Dear Alexis Wheeler   You are scheduled for a TEE (Transesophageal Echocardiogram) on Wednesday, September 18 with Dr. [name].  Please arrive at the [facility] (Main Entrance A) at [facility]: [address] [place], [place] [address] at 11:00 AM (This time is 1.5 hour(s) before your procedure to ensure your preparation). Free valet parking service is available. You will check in at ADMITTING. The support person will be asked to wait in the waiting room.  It is OK to have someone drop you off and come back when you are ready to be discharged.     DIET:  Nothing to eat or drink after midnight except a sip of water with medications (see medication instructions below)  MEDICATION INSTRUCTIONS: !!  IF ANY NEW MEDICATIONS ARE STARTED AFTER TODAY, PLEASE NOTIFY YOUR PROVIDER AS SOON AS POSSIBLE!!  FYI: Medications such as Semaglutide (Ozempic, [place]), Tirzepatide (Mounjaro, Zepbound), Dulaglutide (Trulicity), etc ("GLP1 agonists") AND Canagliflozin (Invokana), Dapagliflozin (Farxiga), Empagliflozin (Jardiance), Ertugliflozin (Steglatro), Bexagliflozin [employer]) or any combination with one of these drugs such as Invokamet  (Canagliflozin/Metformin), Synjardy (Empagliflozin/Metformin), etc ("SGLT2 inhibitors") must be held around the time of a procedure. This is not a comprehensive list of all of these drugs. Please review all of your medications and talk to your provider if you take any one of these. If you are not sure, ask your provider.   Continue taking your anticoagulant (blood thinner): Apixaban (Eliquis).  You will need to continue this after your procedure until you are told by your provider that it is safe to stop.    LABS:   Come to the lab at [facility] at [address] between the hours of 8:00 am and 4:30 pm. You do NOT have to be fasting.  FYI:  For your safety, and to allow [place] to monitor your vital signs accurately during the surgery/procedure we request: If you have artificial nails, gel coating, SNS etc, please have those removed prior to your surgery/procedure. Not having the nail coverings /polish removed may result in cancellation or delay of your surgery/procedure.  You must have a responsible person to drive you home and stay in the waiting area during your procedure. Failure to do so could result in cancellation.  Bring your insurance cards.  *Special Note: Every effort is made to have your procedure done on time. Occasionally there are emergencies that occur at the hospital that may cause delays. Please be patient if a delay does occur.

## 2022-09-23 LAB — CBC
Hematocrit: 43.2 % (ref 34.0–46.6)
Hemoglobin: 13.2 g/dL (ref 11.1–15.9)
MCH: 23.3 pg — ABNORMAL LOW (ref 26.6–33.0)
MCHC: 30.6 g/dL — ABNORMAL LOW (ref 31.5–35.7)
MCV: 76 fL — ABNORMAL LOW (ref 79–97)
Platelets: 217 10*3/uL (ref 150–450)
RBC: 5.66 x10E6/uL — ABNORMAL HIGH (ref 3.77–5.28)
RDW: 15.8 % — ABNORMAL HIGH (ref 11.7–15.4)
WBC: 5.3 10*3/uL (ref 3.4–10.8)

## 2022-09-28 ENCOUNTER — Telehealth: Payer: Self-pay | Admitting: *Deleted

## 2022-09-28 NOTE — Telephone Encounter (Signed)
Patient Name: Alexis Wheeler  DOB: 1955-07-13 MRN: [id number]  Primary Cardiologist: [name], MD  Chart reviewed as part of pre-operative protocol coverage. Cataract extractions are recognized in guidelines as low risk surgeries that do not typically require specific preoperative testing or holding of blood thinner therapy. Therefore, given past medical history and time since last visit, based on ACC/AHA guidelines, Alexis Wheeler would be at acceptable risk for the planned procedure without further cardiovascular testing.   I will route this recommendation to the requesting party via Epic fax function and remove from pre-op pool.  Please call with questions.  Sharlene Dory, PA-C 09/28/2022, 12:56 PM

## 2022-09-28 NOTE — Telephone Encounter (Signed)
Last Ov: 06-20-20 Turner Next Ov: 11-03-22 Verdie Drown     Pre-operative Risk Assessment    Patient Name: Alexis Wheeler  DOB: 1955/01/24 MRN: [id number]      Request for Surgical Clearance    Procedure:   CATARACT EXTRACTION BY PE IOL; LEFT   Date of Surgery:  Clearance 10/16/22                                 Surgeon:  DR Nelva Nay  Surgeon's Group or Practice Name:  [facility] Phone number:  [phone] Fax number:  [phone]    Type of Clearance Requested:   - Medical  hX CAD S/P CABG, PE , DYSLIPIDEMIA ECHO RESULTS 09-30-22   5. What type of anesthesia will be used?  Press F2 and select the anesthesia to be used for the procedure.  :1}  Type of Anesthesia:   IV SEDATION    Additional requests/questions:  Please advise surgeon/provider what medications should be held.  Signed, Oleta Mouse   09/28/2022, 9:30 AM

## 2022-09-29 NOTE — Progress Notes (Signed)
Spoke to pt and instructed them to come at 1030 and to be NPO after 0000. Confirmed no missed doses of AC and instructed to take in AM with a small sip of water.   Confirmed that pt will have a ride home and someone to stay with them for 24 hours after the procedure.

## 2022-09-30 ENCOUNTER — Ambulatory Visit (HOSPITAL_COMMUNITY): Payer: Medicare Other | Admitting: Anesthesiology

## 2022-09-30 ENCOUNTER — Encounter (HOSPITAL_COMMUNITY)
Admission: RE | Disposition: A | Discharge status: Home / Self Care | Payer: Self-pay | Source: Home / Self Care | Attending: Cardiovascular Disease

## 2022-09-30 ENCOUNTER — Ambulatory Visit (HOSPITAL_BASED_OUTPATIENT_CLINIC_OR_DEPARTMENT_OTHER)
Admission: RE | Admit: 2022-09-30 | Discharge: 2022-09-30 | Disposition: A | Discharge status: Home / Self Care | Payer: Medicare Other | Source: Ambulatory Visit | Attending: Nurse Practitioner | Admitting: Nurse Practitioner

## 2022-09-30 ENCOUNTER — Ambulatory Visit (HOSPITAL_COMMUNITY)
Admission: RE | Admit: 2022-09-30 | Discharge: 2022-09-30 | Disposition: A | Discharge status: Home / Self Care | Payer: Medicare Other | Attending: Cardiovascular Disease | Admitting: Cardiovascular Disease

## 2022-09-30 DIAGNOSIS — I351 Nonrheumatic aortic (valve) insufficiency: Secondary | ICD-10-CM | POA: Diagnosis not present

## 2022-09-30 DIAGNOSIS — I10 Essential (primary) hypertension: Secondary | ICD-10-CM | POA: Diagnosis not present

## 2022-09-30 DIAGNOSIS — I7101 Dissection of ascending aorta: Secondary | ICD-10-CM | POA: Insufficient documentation

## 2022-09-30 DIAGNOSIS — I340 Nonrheumatic mitral (valve) insufficiency: Secondary | ICD-10-CM

## 2022-09-30 DIAGNOSIS — G4733 Obstructive sleep apnea (adult) (pediatric): Secondary | ICD-10-CM | POA: Diagnosis not present

## 2022-09-30 DIAGNOSIS — Z6834 Body mass index (BMI) 34.0-34.9, adult: Secondary | ICD-10-CM | POA: Diagnosis not present

## 2022-09-30 DIAGNOSIS — I080 Rheumatic disorders of both mitral and aortic valves: Secondary | ICD-10-CM | POA: Diagnosis present

## 2022-09-30 DIAGNOSIS — Z7901 Long term (current) use of anticoagulants: Secondary | ICD-10-CM | POA: Diagnosis not present

## 2022-09-30 DIAGNOSIS — I110 Hypertensive heart disease with heart failure: Secondary | ICD-10-CM

## 2022-09-30 DIAGNOSIS — I82559 Chronic embolism and thrombosis of unspecified peroneal vein: Secondary | ICD-10-CM | POA: Diagnosis not present

## 2022-09-30 DIAGNOSIS — E785 Hyperlipidemia, unspecified: Secondary | ICD-10-CM | POA: Insufficient documentation

## 2022-09-30 DIAGNOSIS — I2510 Atherosclerotic heart disease of native coronary artery without angina pectoris: Secondary | ICD-10-CM | POA: Insufficient documentation

## 2022-09-30 DIAGNOSIS — I509 Heart failure, unspecified: Secondary | ICD-10-CM

## 2022-09-30 DIAGNOSIS — I061 Rheumatic aortic insufficiency: Secondary | ICD-10-CM

## 2022-09-30 DIAGNOSIS — Z79899 Other long term (current) drug therapy: Secondary | ICD-10-CM | POA: Diagnosis not present

## 2022-09-30 HISTORY — PX: TEE WITHOUT CARDIOVERSION: SHX5443

## 2022-09-30 LAB — POCT I-STAT, CHEM 8
BUN: 8 mg/dL (ref 8–23)
Calcium, Ion: 1.21 mmol/L (ref 1.15–1.40)
Chloride: 102 mmol/L (ref 98–111)
Creatinine, Ser: 1 mg/dL (ref 0.44–1.00)
Glucose, Bld: 106 mg/dL — ABNORMAL HIGH (ref 70–99)
HCT: 40 % (ref 36.0–46.0)
Hemoglobin: 13.6 g/dL (ref 12.0–15.0)
Potassium: 3.3 mmol/L — ABNORMAL LOW (ref 3.5–5.1)
Sodium: 143 mmol/L (ref 135–145)
TCO2: 28 mmol/L (ref 22–32)

## 2022-09-30 LAB — ECHO TEE

## 2022-09-30 SURGERY — ECHOCARDIOGRAM, TRANSESOPHAGEAL
Anesthesia: Monitor Anesthesia Care

## 2022-09-30 MED ORDER — FENTANYL CITRATE (PF) 100 MCG/2ML IJ SOLN: INTRAMUSCULAR | Status: DC | PRN

## 2022-09-30 MED ORDER — LIDOCAINE 2% (20 MG/ML) 5 ML SYRINGE
INTRAMUSCULAR | Status: DC | PRN
  Administered 2022-09-30: 20 mg via INTRAVENOUS

## 2022-09-30 MED ORDER — EPHEDRINE SULFATE (PRESSORS) 50 MG/ML IJ SOLN
INTRAMUSCULAR | Status: DC | PRN
Start: 2022-09-30 — End: 2022-09-30
  Administered 2022-09-30: 10 mg via INTRAVENOUS
  Administered 2022-09-30: 5 mg via INTRAVENOUS
  Administered 2022-09-30: 10 mg via INTRAVENOUS

## 2022-09-30 MED ORDER — PROPOFOL 10 MG/ML IV BOLUS
INTRAVENOUS | Status: DC | PRN
Start: 2022-09-30 — End: 2022-09-30
  Administered 2022-09-30: 50 mg via INTRAVENOUS

## 2022-09-30 MED ORDER — PROPOFOL 500 MG/50ML IV EMUL
INTRAVENOUS | Status: DC | PRN
  Administered 2022-09-30: 125 ug/kg/min via INTRAVENOUS

## 2022-09-30 MED ORDER — SODIUM CHLORIDE 0.9 % IV SOLN: INTRAVENOUS | Status: DC

## 2022-09-30 NOTE — Interval H&P Note (Signed)
History and Physical Interval Note:  09/30/2022 10:51 AM  Alexis Wheeler  has presented today for surgery, with the diagnosis of AORTIC REGURG.  The various methods of treatment have been discussed with the patient and family. After consideration of risks, benefits and other options for treatment, the patient has consented to  Procedure(s): TRANSESOPHAGEAL ECHOCARDIOGRAM (N/A) as a surgical intervention.  The patient's history has been reviewed, patient examined, no change in status, stable for surgery.  I have reviewed the patient's chart and labs.  Questions were answered to the patient's satisfaction.     Charlton Haws

## 2022-09-30 NOTE — CV Procedure (Signed)
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

## 2022-09-30 NOTE — Transfer of Care (Signed)
Immediate Anesthesia Transfer of Care Note  Patient: Alexis Wheeler  Procedure(s) Performed: TRANSESOPHAGEAL ECHOCARDIOGRAM  Patient Location: Cath Lab  Anesthesia Type:MAC  Level of Consciousness: awake, alert , oriented, patient cooperative, and responds to stimulation  Airway & Oxygen Therapy: Patient Spontanous Breathing and Patient connected to nasal cannula oxygen  Post-op Assessment: Report given to RN and Post -op Vital signs reviewed and stable  Post vital signs: Reviewed and stable  Last Vitals:  Vitals Value Taken Time  BP 124/47 09/30/22 1146  Temp 36.4 C 09/30/22 1146  Pulse 64 09/30/22 1148  Resp 16 09/30/22 1148  SpO2 98 % 09/30/22 1148  Vitals shown include unfiled device data.  Last Pain:  Vitals:   09/30/22 1146  TempSrc: Temporal  PainSc: 0-No pain         Complications: No notable events documented.

## 2022-09-30 NOTE — Anesthesia Preprocedure Evaluation (Signed)
Anesthesia Evaluation  Patient identified by MRN, date of birth, ID band Patient awake    Reviewed: Allergy & Precautions, NPO status , Patient's Chart, lab work & pertinent test results  Airway Mallampati: III  TM Distance: >3 FB Neck ROM: Full    Dental  (+) Edentulous Upper, Missing   Pulmonary sleep apnea and Continuous Positive Airway Pressure Ventilation , PE   Pulmonary exam normal        Cardiovascular hypertension, Pt. on medications and Pt. on home beta blockers + CAD, + Past MI, +CHF and + DVT  Normal cardiovascular exam+ Valvular Problems/Murmurs AI      Neuro/Psych  Neuromuscular disease  negative psych ROS   GI/Hepatic negative GI ROS, Neg liver ROS,,,  Endo/Other  negative endocrine ROS    Renal/GU negative Renal ROS     Musculoskeletal negative musculoskeletal ROS (+)    Abdominal  (+) + obese  Peds  Hematology negative hematology ROS (+)   Anesthesia Other Findings AORTIC REGURG  Reproductive/Obstetrics                             Anesthesia Physical Anesthesia Plan  ASA: 3  Anesthesia Plan: MAC   Post-op Pain Management:    Induction: Intravenous  PONV Risk Score and Plan: 2 and Propofol infusion and Treatment may vary due to age or medical condition  Airway Management Planned: Nasal Cannula  Additional Equipment:   Intra-op Plan:   Post-operative Plan:   Informed Consent: I have reviewed the patients History and Physical, chart, labs and discussed the procedure including the risks, benefits and alternatives for the proposed anesthesia with the patient or authorized representative who has indicated his/her understanding and acceptance.     Dental advisory given  Plan Discussed with: CRNA  Anesthesia Plan Comments:        Anesthesia Quick Evaluation

## 2022-10-01 ENCOUNTER — Telehealth: Payer: Self-pay | Admitting: Cardiology

## 2022-10-01 ENCOUNTER — Telehealth (HOSPITAL_COMMUNITY): Payer: Self-pay | Admitting: *Deleted

## 2022-10-01 ENCOUNTER — Encounter (HOSPITAL_COMMUNITY): Payer: Self-pay | Admitting: Cardiovascular Disease

## 2022-10-01 NOTE — Telephone Encounter (Signed)
Left message for patient to call back  

## 2022-10-01 NOTE — Anesthesia Postprocedure Evaluation (Signed)
Anesthesia Post Note  Patient: Alexis Wheeler  Procedure(s) Performed: TRANSESOPHAGEAL ECHOCARDIOGRAM     Patient location during evaluation: Cath Lab Anesthesia Type: MAC Level of consciousness: awake Pain management: pain level controlled Vital Signs Assessment: post-procedure vital signs reviewed and stable Respiratory status: spontaneous breathing, nonlabored ventilation and respiratory function stable Cardiovascular status: blood pressure returned to baseline and stable Postop Assessment: no apparent nausea or vomiting Anesthetic complications: no   No notable events documented.  Last Vitals:  Vitals:   09/30/22 1200 09/30/22 1215  BP: (!) 133/51 (!) 143/65  Pulse: (!) 57 (!) 58  Resp: 15 12  Temp:    SpO2: 95% 94%    Last Pain:  Vitals:   09/30/22 1215  TempSrc:   PainSc: 0-No pain                 Undra Harriman P Sael Furches

## 2022-10-01 NOTE — Telephone Encounter (Signed)
Spoke with patient and she was given setailed instructions about her STRESS TEST on 10/02/22 at 8:00.

## 2022-10-01 NOTE — Telephone Encounter (Signed)
Patient stated she had TEE test yesterday and is concerned about doing the MYOCARDIAL PERFUSION test tomorrow.

## 2022-10-02 ENCOUNTER — Ambulatory Visit (HOSPITAL_COMMUNITY): Payer: Medicare Other | Attending: Cardiology

## 2022-10-05 ENCOUNTER — Telehealth: Payer: Self-pay | Admitting: Cardiology

## 2022-10-05 ENCOUNTER — Telehealth (HOSPITAL_COMMUNITY): Payer: Self-pay | Admitting: Cardiology

## 2022-10-05 DIAGNOSIS — I351 Nonrheumatic aortic (valve) insufficiency: Secondary | ICD-10-CM

## 2022-10-05 DIAGNOSIS — I061 Rheumatic aortic insufficiency: Secondary | ICD-10-CM

## 2022-10-05 NOTE — Telephone Encounter (Signed)
Pt is requesting a callback from Dr. [name] regarding her ECHO TEE and other concerns. Please advise

## 2022-10-05 NOTE — Telephone Encounter (Signed)
Pt advised and referral placed for CVTS.   Pt is having Cataract surgery 10/16/22 and not sure if she should cancel... she will put it on hold for now and will discuss with the surgeon.

## 2022-10-05 NOTE — Telephone Encounter (Signed)
Patient did not come to [facility] appointment scheduled 10/02/22.

## 2022-10-05 NOTE — Telephone Encounter (Signed)
Patient NO SHOWED Myoview on 10/02/22. I called to reschedule and she states that she will not reschedule for reason below:  10/05/22 Patient does not wish to reschedule til Dr [name] decides on what to do for th leakage on her heart. LBW   Order will be removed from the active Nuc WQ. Thank you

## 2022-10-05 NOTE — Telephone Encounter (Signed)
Alexis Reichert, MD  You; Thea Alken B26 minutes ago (3:22 PM)   Hold off on nuclear stress test until seen by Dr. Laneta Simmers as she may need heart cath if AV needs replaced  __________________________________________________________  Referral is in EPIC placed today.  Appointment with Dr. Laneta Simmers not scheduled yet.  Called patient and let her know.  She voices understanding and agreement.

## 2022-10-09 ENCOUNTER — Telehealth: Payer: Self-pay

## 2022-10-09 NOTE — Telephone Encounter (Signed)
Called patient to see if she would be willing to discuss rescheduling stress test as she no showed for . Patient states she is scared to do stress test when her valves have problems, states she wants to talk to Dr. Laneta Simmers first. Patient is currently scheduled to see Dr. Laneta Simmers on 10/21/22. Per Dr. Norris Cross note on 10/05/22, patient is to hold off on stress test until seen by Dr. Laneta Simmers.

## 2022-10-09 NOTE — Telephone Encounter (Signed)
Call to patient to advise that Dr. [name] recommends she come in to see extender to get set up for cath prior to seeing Dr. Laneta Simmers. Tentatively booked 10/16/22 appt (needs approval from provider). Patient verbalizes understanding and agrees to plan.

## 2022-10-12 ENCOUNTER — Telehealth: Payer: Self-pay

## 2022-10-12 NOTE — Progress Notes (Unsigned)
Cardiology Office Note:  .   Date:  10/12/2022  ID:  [name], DOB [birth year], MRN [id number] PCP: [name], MD  [facility] Providers Cardiologist:  [name], MD { Click to update primary MD,subspecialty MD or APP then REFRESH:1}   Patient Profile: .      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  She underwent replacement with resuspension of the aortic valve with 30 mm Hemashield platinum graft with right axillary artery cannulation and hypothermic circulatory arrest.  2D echo was performed which revealed mild LV systolic dysfunction with a EF 40 to 45%.  She had LHC that showed 30% ostial RCA stenosis with 20% mid LAD stenosis.  She underwent cardiac CTA for chest pain in 2018 that showed small PE but no DVT on Doppler study.  In 2020 she underwent lower extremity Doppler due to elevated D-dimer and was found to have DVT in peroneal vein.  She was hospitalized 03/2020 with acute saddle pulmonary embolism and was started on lifelong Eliquis.  Seen by Dr. [name] 08/21/2022 for chest pain that prompted an ED visit with normal workup.  She underwent ETT that showed poor exercise capacity with no evidence of ST deviation.  2D echo was ordered that revealed EF 55 to 60%, mild RV enlargement and LAE with moderate to  severe aortic regurgitation, stable dissection of the descending aorta and stable aortic root repair.   Seen by [name], NP, on 09/22/2022 for TEE to further assess aortic valve.  She reported increased fatigue and occasional episodes of chest discomfort with increased activity.  Her BP was well-controlled and she had no additional concerning cardiac symptoms.    She underwent TEE on 09/30/2022 with Dr. [name] which revealed moderate to severe aortic regurgitation from sinus dilation and perforation and left cusp, intact ascending aortic tube graft with no aneurysm. Recommendation to undergo cardiac cath and referral placed to CT surgery for evaluation.        History of Present Illness: .   Alexis Wheeler is a *** 67 y.o. female ***  ROS: ***       Studies Reviewed: .        *** Risk Assessment/Calculations:   {Does this patient have ATRIAL FIBRILLATION?[phone]} No BP recorded.  {Refresh Note OR Click here to enter BP  :1}***       Physical Exam:   VS:  There were no vitals taken for this visit.   Wt Readings from Last 3 Encounters:  09/30/22 196 lb 10.4 oz (89.2 kg)  09/22/22 196 lb 9.6 oz (89.2 kg)  08/21/22 197 lb 6.4 oz (89.5 kg)    GEN: Well nourished, well developed in no acute distress NECK: No JVD; No carotid bruits CARDIAC: ***RRR, no murmurs, rubs, gallops RESPIRATORY:  Clear to auscultation without rales, wheezing or rhonchi  ABDOMEN: Soft, non-tender, non-distended EXTREMITIES:  No  edema; No deformity     ASSESSMENT AND PLAN: .    Aortic valve regurgitation: TEE 09/30/22 revealed moderate to severe AR from sinus dilatation and perforation in left cusp, enlargement of left sinus > N/R cusps, intact ascending aortic tube graft with no aneurysm, chronic distal dissection. Referred to CT surgery for further evaluation. Will need R/LHC prior.  Ascending aortic dissection: History of acute type 1 aortic dissection with replacement and resuspension of aortic  valve with 30 mm Hemashield platinum graft in 2016 CAD: Mild nonobstructive CAD on cath in 2017.  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  Hypertension: Hyperlipidemia: LDL 72 on 05/06/22    {Are you ordering a CV Procedure (e.g. stress test, cath, DCCV, TEE, etc)?   Press F2        :[id number]}  Dispo: ***  Signed, Eligha Bridegroom, NP-C

## 2022-10-12 NOTE — Telephone Encounter (Signed)
Alexis Reichert, MD  Steve Rattler, RN; Bertram Millard, RN; Luellen Pucker, RN Alcario Drought can you get her in with an extender to get a cath next week before her appt with Dr. Rosealee Albee will need a right and left heart cath.  I spoke with the pt and moved her APP appt up to 10/13/22.   I scheduled her cath for this Friday 10/16/22 at 9 am with Dr Rosemary Holms. Pt advised and will get her instructions at her OV since she does not have My Chart.

## 2022-10-13 ENCOUNTER — Encounter: Payer: Self-pay | Admitting: Nurse Practitioner

## 2022-10-13 ENCOUNTER — Ambulatory Visit: Payer: Medicare Other | Attending: Nurse Practitioner | Admitting: Nurse Practitioner

## 2022-10-13 VITALS — BP 140/66 | HR 59 | Ht 63.0 in | Wt 198.4 lb

## 2022-10-13 DIAGNOSIS — I7101 Dissection of ascending aorta: Secondary | ICD-10-CM

## 2022-10-13 DIAGNOSIS — E785 Hyperlipidemia, unspecified: Secondary | ICD-10-CM

## 2022-10-13 DIAGNOSIS — I2510 Atherosclerotic heart disease of native coronary artery without angina pectoris: Secondary | ICD-10-CM

## 2022-10-13 DIAGNOSIS — I10 Essential (primary) hypertension: Secondary | ICD-10-CM

## 2022-10-13 DIAGNOSIS — I351 Nonrheumatic aortic (valve) insufficiency: Secondary | ICD-10-CM | POA: Diagnosis not present

## 2022-10-13 DIAGNOSIS — I2583 Coronary atherosclerosis due to lipid rich plaque: Secondary | ICD-10-CM

## 2022-10-13 DIAGNOSIS — Z86711 Personal history of pulmonary embolism: Secondary | ICD-10-CM

## 2022-10-13 NOTE — Patient Instructions (Addendum)
Medication Instructions:   Your physician recommends that you continue on your current medications as directed. Please refer to the Current Medication list given to you today.   *If you need a refill on your cardiac medications before your next appointment, please call your pharmacy*   Lab Work:  TODAY!!!! BMET  If you have labs (blood work) drawn today and your tests are completely normal, you will receive your results only by: MyChart Message (if you have MyChart) OR A paper copy in the mail If you have any lab test that is abnormal or we need to change your treatment, we will call you to review the results.   Testing/Procedures:        Cardiac/Peripheral Catheterization   You are scheduled for a Cardiac Catheterization on Friday, October 4 with Dr.  Rosemary Holms .  1. Please arrive at the [facility] (Main Entrance A) at [facility]: [address] [place], [place] [address] at 7:00 AM (This time is 2 hour(s) before your procedure to ensure your preparation). Free valet parking service is available. You will check in at ADMITTING. The support person will be asked to wait in the waiting room.  It is OK to have someone drop you off and come back when you are ready to be discharged.        Special note: Every effort is made to have your procedure done on time. Please understand that emergencies sometimes delay scheduled procedures.  2. Diet: Do not eat solid foods after midnight.  You may have clear liquids until 5 AM the day of the procedure.  3. Labs: You will need to have blood drawn on Tuesday, October 1 at [facility] at [facility]. [address], [place]  Open: 7:30am - 5pm    Phone: [phone]. You do not need to be fasting.  4. Medication instructions in preparation for your procedure:   Contrast Allergy: No   Stop taking Eliquis (Apixiban) on Wednesday, October 2.   On the morning of your procedure, take Aspirin 81 mg and any  morning medicines NOT listed above.  You may use sips of water.  5. Plan to go home the same day, you will only stay overnight if medically necessary. 6. You MUST have a responsible adult to drive you home. 7. An adult MUST be with you the first 24 hours after you arrive home. 8. Bring a current list of your medications, and the last time and date medication taken. 9. Bring ID and current insurance cards. 10.Please wear clothes that are easy to get on and off and wear slip-on shoes.  Thank you for allowing [place] to care for you!   -- [facility]    Follow-Up: At [facility], you and your health needs are our priority.  As part of our continuing mission to provide you with exceptional heart care, we have created designated Provider Care Teams.  These Care Teams include your primary Cardiologist (physician) and Advanced Practice Providers (APPs -  Physician Assistants and Nurse Practitioners) who all work together to provide you with the care you need, when you need it.  We recommend signing up for the patient portal called "MyChart".  Sign up information is provided on this After Visit Summary.  MyChart is used to connect with patients for Virtual Visits (Telemedicine).  Patients are able to view lab/test results, encounter notes, upcoming appointments, etc.  Non-urgent messages can be sent to your provider  as well.   To learn more about what you can do with MyChart, go to [email or web address].    Your next appointment:   3 week(s)  Provider:   [name], NP         Other Instructions:  HOW TO TAKE YOUR BLOOD PRESSURE  Rest 5 minutes before taking your blood pressure. Don't  smoke or drink caffeinated beverages for at least 30 minutes before. Take your blood pressure before (not after) you eat. Sit comfortably with your back supported and both feet on the floor ( don't cross your legs). Elevate your arm to heart level on a table or a  desk. Use the proper sized cuff.  It should fit smoothly and snugly around your bare upper arm.  There should be  Enough room to slip a fingertip under the cuff.  The bottom edge of the cuff should be 1 inch above the crease Of the elbow. Please monitor your blood pressure once daily 2 hours after your am medication. If you blood pressure Consistently remains above 120 (systolic) top number or over 80 ( diastolic) bottom number X 3 days  Consecutively.  Please call our office at [phone] or send Mychart message.     ----Avoid cold medicines with D or DM at the end of them----

## 2022-10-14 ENCOUNTER — Other Ambulatory Visit (HOSPITAL_BASED_OUTPATIENT_CLINIC_OR_DEPARTMENT_OTHER): Payer: Self-pay | Admitting: *Deleted

## 2022-10-14 DIAGNOSIS — E876 Hypokalemia: Secondary | ICD-10-CM

## 2022-10-14 LAB — BASIC METABOLIC PANEL
BUN/Creatinine Ratio: 13 (ref 12–28)
BUN: 12 mg/dL (ref 8–27)
CO2: 25 mmol/L (ref 20–29)
Calcium: 9.2 mg/dL (ref 8.7–10.3)
Chloride: 105 mmol/L (ref 96–106)
Creatinine, Ser: 0.9 mg/dL (ref 0.57–1.00)
Glucose: 100 mg/dL — ABNORMAL HIGH (ref 70–99)
Potassium: 3.3 mmol/L — ABNORMAL LOW (ref 3.5–5.2)
Sodium: 142 mmol/L (ref 134–144)
eGFR: 70 mL/min/{1.73_m2} (ref >59)

## 2022-10-15 ENCOUNTER — Telehealth: Payer: Self-pay | Admitting: *Deleted

## 2022-10-15 NOTE — Telephone Encounter (Signed)
Cardiac Catheterization scheduled at [facility] for: Friday October 16, 2022 9 AM Arrival time [facility] Main Entrance A at: 6:30 AM-needs BMP  Nothing to eat after midnight prior to procedure, clear liquids until 5 AM day of procedure.  Medication instructions: -Hold:  Eliquis-none 10/14/22 until post procedure  -Other usual morning medications can be taken with sips of water including aspirin 81 mg.  Plan to go home the same day, you will only stay overnight if medically necessary.  You must have responsible adult to drive you home.  Someone must be with you the first 24 hours after you arrive home.  Reviewed procedure instructions with patient.  See 10/13/22 BMP results for potassium instructions-pt will take KCl 20 mEq (2 ) tablets tomorrow morning before going to hospital.

## 2022-10-16 ENCOUNTER — Ambulatory Visit (HOSPITAL_COMMUNITY)
Admission: RE | Admit: 2022-10-16 | Discharge: 2022-10-16 | Disposition: A | Discharge status: Home / Self Care | Payer: Medicare Other | Attending: Cardiology | Admitting: Cardiology

## 2022-10-16 ENCOUNTER — Encounter (HOSPITAL_COMMUNITY)
Admission: RE | Disposition: A | Discharge status: Home / Self Care | Payer: Self-pay | Source: Home / Self Care | Attending: Cardiology

## 2022-10-16 ENCOUNTER — Other Ambulatory Visit: Payer: Self-pay

## 2022-10-16 ENCOUNTER — Ambulatory Visit: Payer: Medicare Other | Admitting: Nurse Practitioner

## 2022-10-16 DIAGNOSIS — I10 Essential (primary) hypertension: Secondary | ICD-10-CM | POA: Insufficient documentation

## 2022-10-16 DIAGNOSIS — I351 Nonrheumatic aortic (valve) insufficiency: Secondary | ICD-10-CM | POA: Diagnosis present

## 2022-10-16 DIAGNOSIS — Z86718 Personal history of other venous thrombosis and embolism: Secondary | ICD-10-CM | POA: Insufficient documentation

## 2022-10-16 DIAGNOSIS — I7101 Dissection of ascending aorta: Secondary | ICD-10-CM | POA: Diagnosis not present

## 2022-10-16 DIAGNOSIS — E785 Hyperlipidemia, unspecified: Secondary | ICD-10-CM | POA: Insufficient documentation

## 2022-10-16 DIAGNOSIS — Z7901 Long term (current) use of anticoagulants: Secondary | ICD-10-CM | POA: Diagnosis not present

## 2022-10-16 DIAGNOSIS — I2510 Atherosclerotic heart disease of native coronary artery without angina pectoris: Secondary | ICD-10-CM

## 2022-10-16 DIAGNOSIS — G4733 Obstructive sleep apnea (adult) (pediatric): Secondary | ICD-10-CM | POA: Insufficient documentation

## 2022-10-16 DIAGNOSIS — Z86711 Personal history of pulmonary embolism: Secondary | ICD-10-CM | POA: Diagnosis not present

## 2022-10-16 DIAGNOSIS — Z79899 Other long term (current) drug therapy: Secondary | ICD-10-CM | POA: Diagnosis not present

## 2022-10-16 DIAGNOSIS — Z7982 Long term (current) use of aspirin: Secondary | ICD-10-CM | POA: Diagnosis not present

## 2022-10-16 DIAGNOSIS — Z01812 Encounter for preprocedural laboratory examination: Secondary | ICD-10-CM

## 2022-10-16 DIAGNOSIS — I420 Dilated cardiomyopathy: Secondary | ICD-10-CM | POA: Diagnosis not present

## 2022-10-16 HISTORY — PX: RIGHT/LEFT HEART CATH AND CORONARY ANGIOGRAPHY: CATH118266

## 2022-10-16 LAB — BASIC METABOLIC PANEL
Anion gap: 8 (ref 5–15)
BUN: 12 mg/dL (ref 8–23)
CO2: 28 mmol/L (ref 22–32)
Calcium: 9 mg/dL (ref 8.9–10.3)
Chloride: 104 mmol/L (ref 98–111)
Creatinine, Ser: 1 mg/dL (ref 0.44–1.00)
GFR, Estimated: >60 mL/min (ref >60)
Glucose, Bld: 107 mg/dL — ABNORMAL HIGH (ref 70–99)
Potassium: 3.7 mmol/L (ref 3.5–5.1)
Sodium: 140 mmol/L (ref 135–145)

## 2022-10-16 LAB — POCT I-STAT 7, (LYTES, BLD GAS, ICA,H+H)
Acid-Base Excess: 0 mmol/L (ref 0.0–2.0)
Bicarbonate: 25.4 mmol/L (ref 20.0–28.0)
Calcium, Ion: 1.14 mmol/L — ABNORMAL LOW (ref 1.15–1.40)
HCT: 37 % (ref 36.0–46.0)
Hemoglobin: 12.6 g/dL (ref 12.0–15.0)
O2 Saturation: 95 %
Potassium: 3.6 mmol/L (ref 3.5–5.1)
Sodium: 143 mmol/L (ref 135–145)
TCO2: 27 mmol/L (ref 22–32)
pCO2 arterial: 43.5 mm[Hg] (ref 32–48)
pH, Arterial: 7.373 (ref 7.35–7.45)
pO2, Arterial: 77 mm[Hg] — ABNORMAL LOW (ref 83–108)

## 2022-10-16 LAB — POCT I-STAT EG7
Acid-Base Excess: 0 mmol/L (ref 0.0–2.0)
Bicarbonate: 26.2 mmol/L (ref 20.0–28.0)
Calcium, Ion: 1.15 mmol/L (ref 1.15–1.40)
HCT: 37 % (ref 36.0–46.0)
Hemoglobin: 12.6 g/dL (ref 12.0–15.0)
O2 Saturation: 72 %
Potassium: 3.6 mmol/L (ref 3.5–5.1)
Sodium: 144 mmol/L (ref 135–145)
TCO2: 28 mmol/L (ref 22–32)
pCO2, Ven: 48.1 mm[Hg] (ref 44–60)
pH, Ven: 7.345 (ref 7.25–7.43)
pO2, Ven: 40 mm[Hg] (ref 32–45)

## 2022-10-16 SURGERY — RIGHT/LEFT HEART CATH AND CORONARY ANGIOGRAPHY
Anesthesia: LOCAL

## 2022-10-16 MED ORDER — ONDANSETRON HCL 4 MG/2ML IJ SOLN: 4.0000 mg | Freq: Four times a day (QID) | INTRAMUSCULAR | Status: DC | PRN

## 2022-10-16 MED ORDER — HYDRALAZINE HCL 20 MG/ML IJ SOLN: 10.0000 mg | INTRAMUSCULAR | Status: DC | PRN

## 2022-10-16 MED ORDER — HEPARIN SODIUM (PORCINE) 1000 UNIT/ML IJ SOLN
INTRAMUSCULAR | Status: DC | PRN
  Administered 2022-10-16: 4500 [IU] via INTRAVENOUS

## 2022-10-16 MED ORDER — LABETALOL HCL 5 MG/ML IV SOLN: 10.0000 mg | INTRAVENOUS | Status: DC | PRN

## 2022-10-16 MED ORDER — HEPARIN SODIUM (PORCINE) 1000 UNIT/ML IJ SOLN
INTRAMUSCULAR | Status: AC
  Filled 2022-10-16: qty 10

## 2022-10-16 MED ORDER — SODIUM CHLORIDE 0.9 % IV SOLN: INTRAVENOUS | Status: AC

## 2022-10-16 MED ORDER — FENTANYL CITRATE (PF) 100 MCG/2ML IJ SOLN
INTRAMUSCULAR | Status: AC
  Filled 2022-10-16: qty 2

## 2022-10-16 MED ORDER — HEPARIN (PORCINE) IN NACL 1000-0.9 UT/500ML-% IV SOLN
INTRAVENOUS | Status: DC | PRN
  Administered 2022-10-16 (×2): 500 mL

## 2022-10-16 MED ORDER — VERAPAMIL HCL 2.5 MG/ML IV SOLN
INTRAVENOUS | Status: AC
  Filled 2022-10-16: qty 2

## 2022-10-16 MED ORDER — LIDOCAINE HCL (PF) 1 % IJ SOLN
INTRAMUSCULAR | Status: AC
  Filled 2022-10-16: qty 30

## 2022-10-16 MED ORDER — MIDAZOLAM HCL 2 MG/2ML IJ SOLN
INTRAMUSCULAR | Status: AC
  Filled 2022-10-16: qty 2

## 2022-10-16 MED ORDER — SODIUM CHLORIDE 0.9 % WEIGHT BASED INFUSION
3.0000 mL/kg/h | INTRAVENOUS | Status: AC
  Administered 2022-10-16: 3 mL/kg/h via INTRAVENOUS

## 2022-10-16 MED ORDER — SODIUM CHLORIDE 0.9 % IV SOLN: 250.0000 mL | INTRAVENOUS | Status: DC | PRN

## 2022-10-16 MED ORDER — VERAPAMIL HCL 2.5 MG/ML IV SOLN
INTRAVENOUS | Status: DC | PRN
  Administered 2022-10-16: 10 mL via INTRA_ARTERIAL

## 2022-10-16 MED ORDER — SODIUM CHLORIDE 0.9 % WEIGHT BASED INFUSION: 1.0000 mL/kg/h | INTRAVENOUS | Status: DC

## 2022-10-16 MED ORDER — SODIUM CHLORIDE 0.9% FLUSH: 3.0000 mL | Freq: Two times a day (BID) | INTRAVENOUS | Status: DC

## 2022-10-16 MED ORDER — ASPIRIN 81 MG PO CHEW: 81.0000 mg | CHEWABLE_TABLET | ORAL | Status: DC

## 2022-10-16 MED ORDER — SODIUM CHLORIDE 0.9% FLUSH: 3.0000 mL | INTRAVENOUS | Status: DC | PRN

## 2022-10-16 MED ORDER — MIDAZOLAM HCL 2 MG/2ML IJ SOLN
INTRAMUSCULAR | Status: DC | PRN
  Administered 2022-10-16: 1 mg via INTRAVENOUS

## 2022-10-16 MED ORDER — FENTANYL CITRATE (PF) 100 MCG/2ML IJ SOLN
INTRAMUSCULAR | Status: DC | PRN
  Administered 2022-10-16: 25 ug via INTRAVENOUS

## 2022-10-16 MED ORDER — LIDOCAINE HCL (PF) 1 % IJ SOLN
INTRAMUSCULAR | Status: DC | PRN
  Administered 2022-10-16 (×2): 2 mL

## 2022-10-16 MED ORDER — IOHEXOL 350 MG/ML SOLN
INTRAVENOUS | Status: DC | PRN
  Administered 2022-10-16: 45 mL

## 2022-10-16 MED ORDER — ACETAMINOPHEN 325 MG PO TABS: 650.0000 mg | ORAL_TABLET | ORAL | Status: DC | PRN

## 2022-10-16 SURGICAL SUPPLY — 13 items
CATH BALLN WEDGE 5F 110CM (CATHETERS) IMPLANT
CATH INFINITI 5FR JL4 (CATHETERS) IMPLANT
CATH INFINITI 5FR JL5 (CATHETERS) IMPLANT
CATH INFINITI AMBI 5FR TG (CATHETERS) IMPLANT
CATH INFINITI JR4 5F (CATHETERS) IMPLANT
DEVICE RAD COMP TR BAND LRG (VASCULAR PRODUCTS) IMPLANT
GLIDESHEATH SLEND A-KIT 6F 22G (SHEATH) IMPLANT
GUIDEWIRE INQWIRE 1.5J.035X260 (WIRE) IMPLANT
INQWIRE 1.5J .035X260CM (WIRE) ×1
PACK CARDIAC CATHETERIZATION (CUSTOM PROCEDURE TRAY) ×1 IMPLANT
SET ATX-X65L (MISCELLANEOUS) IMPLANT
SHEATH GLIDE SLENDER 4/5FR (SHEATH) IMPLANT
WIRE MICROINTRODUCER 60CM (WIRE) IMPLANT

## 2022-10-16 NOTE — Discharge Instructions (Signed)

## 2022-10-16 NOTE — Progress Notes (Signed)
Patient and friend was given discharge instructions. Both verbalized understanding. 

## 2022-10-16 NOTE — Interval H&P Note (Signed)
History and Physical Interval Note:  10/16/2022 8:07 AM  Alexis Wheeler  has presented today for surgery, with the diagnosis of aortic insufficency.  The various methods of treatment have been discussed with the patient and family. After consideration of risks, benefits and other options for treatment, the patient has consented to  Procedure(s): RIGHT/LEFT HEART CATH AND CORONARY ANGIOGRAPHY (N/A) as a surgical intervention.  The patient's history has been reviewed, patient examined, no change in status, stable for surgery.  I have reviewed the patient's chart and labs.  Questions were answered to the patient's satisfaction.      Timmi Devora J Caliegh Middlekauff

## 2022-10-16 NOTE — H&P (Signed)
OV 10/13/2022 copied for documentation   Cardiology Office Note:  .   Date:  10/16/2022  ID:  Alexis Wheeler, DOB 12-07-1955, MRN [id number] PCP: [name], MD  [facility] Providers Cardiologist:  [name], MD    Patient Profile: .      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  She underwent replacement with resuspension of the aortic valve with 30 mm Hemashield platinum graft with right axillary artery cannulation and hypothermic circulatory arrest.  2D echo was performed which revealed mild LV systolic dysfunction with a EF 40 to 45%.  She had LHC that showed 30% ostial RCA stenosis with 20% mid LAD stenosis.  She underwent cardiac CTA for chest pain in 2018 that showed small PE but no DVT on Doppler study.  In 2020 she underwent lower extremity Doppler due to elevated D-dimer and was found to have DVT in peroneal vein.  She was hospitalized 03/2020 with acute saddle pulmonary embolism and was started on lifelong Eliquis.  Seen by Dr. [name] 08/21/2022 for chest pain that prompted an ED visit with normal workup.  She underwent ETT that showed poor exercise capacity with no evidence of ST deviation.  2D echo was ordered that revealed EF 55 to 60%, mild RV enlargement and LAE with moderate to severe aortic regurgitation,  stable dissection of the descending aorta and stable aortic root repair.   Seen by [name], NP, on 09/22/2022 for TEE to further assess aortic valve.  She reported increased fatigue and occasional episodes of chest discomfort with increased activity.  Her BP was well-controlled and she had no additional concerning cardiac symptoms.    She underwent TEE on 09/30/2022 with Dr. [name] which revealed moderate to severe aortic regurgitation from sinus dilation and perforation and left cusp, intact ascending aortic tube graft with no aneurysm. Recommendation to undergo cardiac cath and referral placed to CT surgery for evaluation.        History of Present Illness: .   Alexis Wheeler is a very pleasant 67 y.o. female who is here today for follow-up of TEE and to schedule cardiac catheterization. Reports she is feeling fatigued.  She has been feeling this way for an undesignated amount of time.  Reports chest pain which she reported at office visit in August has resolved.  Reports chronic shortness of breath but feels that it is stable.  Says she was encouraged by PCP to work on weight loss.  Reports appetite is good, admits she likes to eat and does not exercise on a regular basis.  She denies orthopnea, PND, palpitations, edema, presyncope, syncope.  Risks of cardiac catheterization have been reviewed and she agrees to proceed.  ROS: See HPI       Studies Reviewed: [name]             Physical Exam:   VS:  BP [name])  152/61   Pulse (!) 52   Temp 98 F (36.7 C) (Oral)   Resp 16   Ht 5\' 3"  (1.6 m)   Wt 89.8 kg   SpO2 98%   BMI 35.07 kg/m    Wt Readings from Last 3 Encounters:  10/16/22 89.8 kg  10/13/22 90 kg  09/30/22 89.2 kg    GEN: Obese, well developed in no acute distress NECK: No JVD; No carotid bruits CARDIAC: RRR, no murmurs, rubs, gallops RESPIRATORY:  Clear to auscultation without rales, wheezing or rhonchi  ABDOMEN: Soft, non-tender, non-distended EXTREMITIES:  No edema; No  deformity     ASSESSMENT AND PLAN: .    Aortic valve regurgitation: She reports shortness of breath for some time that she feels is stable. Feeling fatigued recently. No acute concerns today. TEE 09/30/22 revealed moderate to severe AR from sinus dilatation and perforation in left cusp, enlargement of left sinus > N/R cusps, intact ascending aortic tube graft with no aneurysm, chronic distal dissection. Referred to CT surgery for further evaluation. Recommendation for [facility]. Risks of the procedure reviewed and she agrees to proceed. Questions answered to her satisfaction.    Ascending aortic dissection: History of acute type 1 aortic dissection with replacement and resuspension of aortic valve with 30 mm Hemashield platinum graft in 2016  CAD: Mild nonobstructive CAD on cath in 2017. She is having some shortness of breath that she feels is stable. She denies chest pain, dyspnea, or other symptoms concerning for angina.  We will schedule right and left heart catheterization for evaluation of aortic valve and prior aortic dissection as well as evaluate for worsening coronary disease.  Risks of cardiac catheterization reviewed and she agrees to proceed. Continue aspirin.   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 No bleeding concerns. No missed doses of OAC. We will hold Eliquis for 48 hours prior to upcoming cardiac cath. Resume as soon as hemodynamically stable following the procedure.   Hypertension: BP is elevated in the office today.  Home BP readings are variable and range with systolic from 112 to 162.  I have encouraged her to monitor BP on a routine basis at least 2 hours after morning medication and bring those with her to her next appointment.  No medication changes today. Goal BP is 120/80 or below.   Hyperlipidemia LDL goal < 70: LDL 72 on 05/06/22.  She is close to goal.  Encouraged heart healthy diet avoiding saturated fat, processed food, and  simple carbohydrates along with regular physical activity.  Continue atorvastatin and WelChol.     Informed Consent   Shared Decision Making/Informed Consent The risks [stroke (1 in 1000), death (1 in 1000), kidney failure [usually temporary] (1 in 500), bleeding (1 in 200), allergic reaction [possibly serious] (1 in 200)], benefits (diagnostic support and management of coronary artery disease) and alternatives of a cardiac catheterization were discussed in detail with Ms. Brandes and she is willing to proceed.     Dispo: Keep your October appointment with [name], NP  Signed, Eligha Bridegroom, NP-C

## 2022-10-19 ENCOUNTER — Telehealth: Payer: Self-pay | Admitting: Cardiology

## 2022-10-19 ENCOUNTER — Encounter (HOSPITAL_COMMUNITY): Payer: Self-pay | Admitting: Cardiology

## 2022-10-19 NOTE — Telephone Encounter (Signed)
Called patient back and informed her of Dr. Damian Leavell advisement. Patient verbalized understanding.

## 2022-10-19 NOTE — Telephone Encounter (Signed)
I met the patient for the first time at the time of heart catheterization.  Patient was not started on any new blood thinner after the heart catheterization.  I do not think blood in urine or stools or the back/thigh pain is related to the procedure. Differential thoughts are UTI/kidney stones etc.  I recommend increasing fluid intake and check with PCP or urgent care.  Thanks MJP

## 2022-10-19 NOTE — Telephone Encounter (Signed)
Called patient about her message. Patient complaining of pain/ache/cramping in her middle lower back and back of her bilateral thighs. Patient stated she had light bleeding in her urine and has bright red blood when she wipes after BM. Right wrist at cath site is doing fine. Will send message to Dr. Rosemary Holms for advisement.

## 2022-10-19 NOTE — Telephone Encounter (Signed)
Patient is calling because she had a recent procedure on 10/04. Patient stated her back and the back of her legs have been hurting since Saturday. Patient states that she is having a light amount of blood in her urine. Please advise.

## 2022-10-20 ENCOUNTER — Encounter (HOSPITAL_BASED_OUTPATIENT_CLINIC_OR_DEPARTMENT_OTHER): Payer: Self-pay | Admitting: Emergency Medicine

## 2022-10-20 ENCOUNTER — Emergency Department (HOSPITAL_BASED_OUTPATIENT_CLINIC_OR_DEPARTMENT_OTHER)
Admission: EM | Admit: 2022-10-20 | Discharge: 2022-10-20 | Disposition: A | Discharge status: Home / Self Care | Payer: Medicare Other | Attending: Emergency Medicine | Admitting: Emergency Medicine

## 2022-10-20 ENCOUNTER — Other Ambulatory Visit: Payer: Self-pay

## 2022-10-20 ENCOUNTER — Emergency Department (HOSPITAL_BASED_OUTPATIENT_CLINIC_OR_DEPARTMENT_OTHER): Payer: Medicare Other

## 2022-10-20 DIAGNOSIS — Z7901 Long term (current) use of anticoagulants: Secondary | ICD-10-CM | POA: Diagnosis not present

## 2022-10-20 DIAGNOSIS — M5441 Lumbago with sciatica, right side: Secondary | ICD-10-CM | POA: Diagnosis not present

## 2022-10-20 DIAGNOSIS — R109 Unspecified abdominal pain: Secondary | ICD-10-CM | POA: Diagnosis present

## 2022-10-20 DIAGNOSIS — I10 Essential (primary) hypertension: Secondary | ICD-10-CM | POA: Diagnosis not present

## 2022-10-20 DIAGNOSIS — G8929 Other chronic pain: Secondary | ICD-10-CM | POA: Insufficient documentation

## 2022-10-20 LAB — COMPREHENSIVE METABOLIC PANEL
ALT: 10 U/L (ref 0–44)
AST: 11 U/L — ABNORMAL LOW (ref 15–41)
Albumin: 3.8 g/dL (ref 3.5–5.0)
Alkaline Phosphatase: 67 U/L (ref 38–126)
Anion gap: 6 (ref 5–15)
BUN: 17 mg/dL (ref 8–23)
CO2: 30 mmol/L (ref 22–32)
Calcium: 8.8 mg/dL — ABNORMAL LOW (ref 8.9–10.3)
Chloride: 106 mmol/L (ref 98–111)
Creatinine, Ser: 1.02 mg/dL — ABNORMAL HIGH (ref 0.44–1.00)
GFR, Estimated: 60 mL/min — ABNORMAL LOW (ref >60)
Glucose, Bld: 97 mg/dL (ref 70–99)
Potassium: 3.7 mmol/L (ref 3.5–5.1)
Sodium: 142 mmol/L (ref 135–145)
Total Bilirubin: 0.3 mg/dL (ref 0.3–1.2)
Total Protein: 6.4 g/dL — ABNORMAL LOW (ref 6.5–8.1)

## 2022-10-20 LAB — URINALYSIS, ROUTINE W REFLEX MICROSCOPIC
Bacteria, UA: NONE SEEN
Bilirubin Urine: NEGATIVE
Glucose, UA: NEGATIVE mg/dL
Hgb urine dipstick: NEGATIVE
Ketones, ur: NEGATIVE mg/dL
Nitrite: NEGATIVE
Protein, ur: 30 mg/dL — AB
Specific Gravity, Urine: 1.032 — ABNORMAL HIGH (ref 1.005–1.030)
pH: 6.5 (ref 5.0–8.0)

## 2022-10-20 LAB — CBC WITH DIFFERENTIAL/PLATELET
Abs Immature Granulocytes: 0.02 10*3/uL (ref 0.00–0.07)
Basophils Absolute: 0 10*3/uL (ref 0.0–0.1)
Basophils Relative: 0 %
Eosinophils Absolute: 0.3 10*3/uL (ref 0.0–0.5)
Eosinophils Relative: 5 %
HCT: 37.8 % (ref 36.0–46.0)
Hemoglobin: 12.2 g/dL (ref 12.0–15.0)
Immature Granulocytes: 0 %
Lymphocytes Relative: 37 %
Lymphs Abs: 2.3 10*3/uL (ref 0.7–4.0)
MCH: 23.4 pg — ABNORMAL LOW (ref 26.0–34.0)
MCHC: 32.3 g/dL (ref 30.0–36.0)
MCV: 72.4 fL — ABNORMAL LOW (ref 80.0–100.0)
Monocytes Absolute: 0.6 10*3/uL (ref 0.1–1.0)
Monocytes Relative: 9 %
Neutro Abs: 3 10*3/uL (ref 1.7–7.7)
Neutrophils Relative %: 49 %
Platelets: 192 10*3/uL (ref 150–400)
RBC: 5.22 MIL/uL — ABNORMAL HIGH (ref 3.87–5.11)
RDW: 16.2 % — ABNORMAL HIGH (ref 11.5–15.5)
WBC: 6.2 10*3/uL (ref 4.0–10.5)
nRBC: 0 % (ref 0.0–0.2)

## 2022-10-20 NOTE — ED Notes (Signed)
Pt aware of the need for a urine... Unable to currently provide the sample... 

## 2022-10-20 NOTE — ED Triage Notes (Signed)
Pt c/o RT side lower back pain and RLE posterior, proximal pain x 5 daysPt reports "wiping blood" after urination and BM x 4 day. Heart cath on 10/4. Pt denies CP or shob

## 2022-10-20 NOTE — ED Notes (Signed)
Pt discharged home after verbalizing understanding of discharge instructions; nad noted. 

## 2022-10-20 NOTE — ED Provider Notes (Signed)
[place] EMERGENCY DEPARTMENT AT [facility] Provider Note   CSN: [id number] Arrival date & time: 10/20/22  1055     History Chief Complaint  Patient presents with   Back Pain    HPI Alexis Wheeler is a 67 y.o. female presenting for multiple complaints. Multiple symptoms: first was post urination bleeding (BRB) x 5 days. Some bleeding yesterday when wiping her bottom.  None today.  Right flank pain today radiating down the right leg  HX of DVT/PE compliant on Eliquis. Hx of TAA with dissection, dilated CM, OSA, HLD, HTN.  Patient's recorded medical, surgical, social, medication list and allergies were reviewed in the Snapshot window as part of the initial history.   Review of Systems   Review of Systems  Constitutional:  Negative for chills and fever.  HENT:  Negative for ear pain and sore throat.   Eyes:  Negative for pain and visual disturbance.  Respiratory:  Negative for cough and shortness of breath.   Cardiovascular:  Negative for chest pain and palpitations.  Gastrointestinal:  Positive for anal bleeding. Negative for abdominal pain and vomiting.  Genitourinary:  Positive for flank pain and hematuria. Negative for dysuria.  Musculoskeletal:  Negative for arthralgias and back pain.  Skin:  Negative for color change and rash.  Neurological:  Negative for seizures and syncope.  All other systems reviewed and are negative.   Physical Exam Updated Vital Signs BP (!) 161/78 (BP Location: Right Arm)   Pulse (!) 51   Temp 98.4 F (36.9 C) (Oral)   Resp 16   Wt 89.8 kg   SpO2 100%   BMI 35.07 kg/m  Physical Exam Vitals and nursing note reviewed.  Constitutional:      General: She is not in acute distress.    Appearance: She is well-developed.  HENT:     Head: Normocephalic and atraumatic.  Eyes:     Conjunctiva/sclera: Conjunctivae normal.  Cardiovascular:     Rate and Rhythm: Normal rate and regular rhythm.     Heart sounds: No murmur  heard. Pulmonary:     Effort: Pulmonary effort is normal. No respiratory distress.     Breath sounds: Normal breath sounds.  Abdominal:     Palpations: Abdomen is soft.     Tenderness: There is no abdominal tenderness.  Musculoskeletal:        General: No swelling.     Cervical back: Neck supple.  Skin:    General: Skin is warm and dry.     Capillary Refill: Capillary refill takes less than 2 seconds.  Neurological:     Mental Status: She is alert.  Psychiatric:        Mood and Affect: Mood normal.      ED Course/ Medical Decision Making/ A&P Clinical Course as of 10/20/22 1602  Tue Oct 20, 2022  1537 US Venous Img Lower Unilateral Right [CC]    Clinical Course User Index [CC] Glyn Ade, MD    Procedures Procedures   Medications Ordered in ED Medications - No data to display  Medical Decision Making:    Alexis Wheeler is a 67 y.o. female who presented to the ED today with multiple complaints detailed above.     Patient placed on continuous vitals and telemetry monitoring while in ED which was reviewed periodically.   Complete initial physical exam performed, notably the patient  was hemodynamically stable no acute distress.  No active bleeding on exam.  All symptoms have resolved over the  past 24 hours.      Reviewed and confirmed nursing documentation for past medical history, family history, social history.    Initial Assessment:   This is most consistent with an acute life/limb threatening illness complicated by underlying chronic conditions. Concerning patient's rectal bleeding, this is likely hemorrhoidal given her history of similar and Eliquis use.  It is small amount of hematochezia currently resolved as of this morning.  Unlikely to be diverticular, severe bleed or AVM. Will measure hemoglobin, observe in emergency room.  She has any further bleeding we will consider emergent GI consultation.  Otherwise can be followed up outpatient with  GI.  Concerning patient's hematuria, she does have right sided flank pain that seems more musculoskeletal.  Urolithiasis would be on the differential though less likely given the radiculopathy nature of her right flank pain.  Her pain is radiating from her right back radiating down her sciatic nerve more consistent with musculoskeletal etiology.  Will evaluate with metabolic panel and CT renal study to evaluate for nephrolithiasis versus large intra-abdominal pathology. AAA is considered less likely given lack of any severe pain, very mild nature, right-sided nature, duration of symptoms being approximately 5 days.    Initial Study Results:   Laboratory  All laboratory results reviewed without evidence of clinically relevant pathology.     EKG EKG was reviewed independently. Rate, rhythm, axis, intervals all examined and without medically relevant abnormality. ST segments without concerns for elevations.    Radiology  All images reviewed independently. Agree with radiology report at this time.   CT Renal Stone Study  Result Date: 10/20/2022 CLINICAL DATA:  Right-sided abdominal and back pain for 5 days. Hematuria. Nephrolithiasis. EXAM: CT ABDOMEN AND PELVIS WITHOUT CONTRAST TECHNIQUE: Multidetector CT imaging of the abdomen and pelvis was performed following the standard protocol without IV contrast. RADIATION DOSE REDUCTION: This exam was performed according to the departmental dose-optimization program which includes automated exposure control, adjustment of the mA and/or kV according to patient size and/or use of iterative reconstruction technique. COMPARISON:  02/10/2019 FINDINGS: Lower chest: No acute findings. Hepatobiliary: No mass visualized on this unenhanced exam. Prior cholecystectomy. No evidence of biliary obstruction. Pancreas: No mass or inflammatory process visualized on this unenhanced exam. Spleen:  Within normal limits in size. Adrenals/Urinary tract: Stable 1.7 cm  low-attenuation left adrenal mass, consistent with benign adenoma (No followup imaging is recommended). Moderate left renal parenchymal atrophy and scarring is again noted. 2 mm calculus seen in midpole of left kidney. No evidence of ureteral calculi or hydronephrosis. Unremarkable unopacified urinary bladder. Stomach/Bowel: No evidence of obstruction, inflammatory process, or abnormal fluid collections. Diverticulosis is seen mainly involving the sigmoid colon, however there is no evidence of diverticulitis. Vascular/Lymphatic: No pathologically enlarged lymph nodes identified. No evidence of abdominal aortic aneurysm. Reproductive: Prior hysterectomy noted. Adnexal regions are unremarkable in appearance. Other:  None. Musculoskeletal:  No suspicious bone lesions identified. IMPRESSION: Tiny nonobstructing left renal calculus and renal scarring. No evidence of ureteral calculi, hydronephrosis, or other acute findings. Colonic diverticulosis, without radiographic evidence of diverticulitis. Electronically Signed   By: Danae Orleans M.D.   On: 10/20/2022 15:03   US Venous Img Lower Unilateral Right  Result Date: 10/20/2022 CLINICAL DATA:  Right lower extremity pain and edema for the past 5 days. Evaluate for DVT. EXAM: RIGHT LOWER EXTREMITY VENOUS DOPPLER ULTRASOUND TECHNIQUE: Gray-scale sonography with graded compression, as well as color Doppler and duplex ultrasound were performed to evaluate the lower extremity deep venous systems from the  level of the common femoral vein and including the common femoral, femoral, profunda femoral, popliteal and calf veins including the posterior tibial, peroneal and gastrocnemius veins when visible. The superficial great saphenous vein was also interrogated. Spectral Doppler was utilized to evaluate flow at rest and with distal augmentation maneuvers in the common femoral, femoral and popliteal veins. COMPARISON:  None Available. FINDINGS: Contralateral Common Femoral Vein:  Respiratory phasicity is normal and symmetric with the symptomatic side. No evidence of thrombus. Normal compressibility. Common Femoral Vein: No evidence of thrombus. Normal compressibility, respiratory phasicity and response to augmentation. Saphenofemoral Junction: No evidence of thrombus. Normal compressibility and flow on color Doppler imaging. Profunda Femoral Vein: No evidence of thrombus. Normal compressibility and flow on color Doppler imaging. Femoral Vein: No evidence of thrombus. Normal compressibility, respiratory phasicity and response to augmentation. Popliteal Vein: No evidence of thrombus. Normal compressibility, respiratory phasicity and response to augmentation. Calf Veins: No evidence of thrombus. Normal compressibility and flow on color Doppler imaging. Superficial Great Saphenous Vein: No evidence of thrombus. Normal compressibility. Other Findings:  None. IMPRESSION: No evidence of DVT within the right lower extremity. Electronically Signed   By: Simonne Come M.D.   On: 10/20/2022 14:16      Reassessment and Plan:   Reassessment, patient is requesting discharge.  She states her pain is all improved without any therapy in the emergency room.  She states that she feels fine and would like to be discharged.  This is a reassuring physical exam given that all of her studies are negative hemoglobin is at baseline she has had no further bleeding during her 5 hours of observation here in the emergency room.  Given reassuring studies well appearance I believe patient stable for outpatient care management strict return precautions reinforced.  Patient expressed understanding   Disposition:  I have considered need for hospitalization, however, considering all of the above, I believe this patient is stable for discharge at this time.  Patient/family educated about specific return precautions for given chief complaint and symptoms.  Patient/family educated about follow-up with PCP.      Patient/family expressed understanding of return precautions and need for follow-up. Patient spoken to regarding all imaging and laboratory results and appropriate follow up for these results. All education provided in verbal form with additional information in written form. Time was allowed for answering of patient questions. Patient discharged.    Emergency Department Medication Summary:   Medications - No data to display       Clinical Impression:  1. Chronic right-sided low back pain with right-sided sciatica      Discharge   Final Clinical Impression(s) / ED Diagnoses Final diagnoses:  Chronic right-sided low back pain with right-sided sciatica    Rx / DC Orders ED Discharge Orders     None         Glyn Ade, MD 10/20/22 [phone]

## 2022-10-21 ENCOUNTER — Other Ambulatory Visit: Payer: Self-pay | Admitting: Surgery

## 2022-10-21 ENCOUNTER — Encounter: Payer: Self-pay | Admitting: Surgery

## 2022-10-21 ENCOUNTER — Institutional Professional Consult (permissible substitution): Payer: Medicare Other | Admitting: Surgery

## 2022-10-21 VITALS — BP 168/76 | HR 60 | Resp 18 | Ht 63.0 in | Wt 200.0 lb

## 2022-10-21 DIAGNOSIS — I351 Nonrheumatic aortic (valve) insufficiency: Secondary | ICD-10-CM | POA: Diagnosis not present

## 2022-10-21 NOTE — Progress Notes (Signed)
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 Her postop echo in July 2016 showed only trivial AI with an aortic root diameter of 42 mm and an EF of 55-60%. AI was mild in 03/2018 and 03/2019. AI was moderate in 03/2020 and 07/2020 with a pressure half time of 359 msec. Her most recent echo on 09/08/2022 showed eccentric moderate to severe AI with a PHT of 354 msec and a vena contracta of 1 cm but no diastolic flow reversal in the descending aorta. LV diastolic diameter was 4.85 cm. Aortic root diameter was measured at 4.5 cm.  She presented to the ER 08/13/2022 with chest heaviness. ECG and troponin were negative. Her pain was resolved with NSAIDs. She saw Dr. [name] and had an ETT that showed no ST changes but was inadequate due to poor exercise capacity. A TEE showed a dilated aortic root to 4.8 cm with lack of central coaptation of the aortic valve leaflets and moderate to severe AI with a VC of 0.49 cm2, regurgitant volume of 50 cc, AI ERO of 0.33 cm2 and AI PISA radius of 0.6 cm. LV and RV systolic function were normal. Cath showed no significant coronary artery disease with normal PA pressures and CI 3.2.  She is here today with her daughter. She reports some shortness of breath and fatigue with exertion. This occurs with walking up stairs or inclines and cleaning her house. She has not had any further chest  discomfort, no dizziness, no LE swelling.     Past Medical History:  Diagnosis Date   Anemia    childhood   Aortic insufficiency    Moderate to severe by echo 08/2022   Arthritis    right knee   Ascending aortic dissection [facility]  Outpatient [facility])    s/p repair by Dr. [name].  2D echo 07/2020 with aortic root 41mm   Blood transfusion without reported diagnosis    with heart surgery-repair aorta   CAD (coronary artery disease)    a. 04/2015: cath showed 30% Ost RCA stenosis, 20% mid-LAD stenosis, and normal LV function.    Cataract    bilateral eyes, per pt.   Congestive dilated cardiomyopathy (HCC) 08/01/2014   DVT (deep venous thrombosis) (HCC)    right   GERD    HEMATURIA UNSPECIFIED    History of kidney stones    HYPERLIPIDEMIA    HYPERTENSION    Morbid obesity (HCC)    Myocardial infarction (HCC)    01/30/2014   MYOSITIS    OSA on CPAP 05/10/2017   mild OSA with an AHI of 5.2/hr overall but severe during REM sleep with an AHI of 41.4/hr and underwent CPAP titration to 12cm H2O   Pulmonary embolism with acute cor pulmonale (HCC) 03/2020   Saddle embolism with acute cor pulmonale   Sleep apnea    cpap-  doesnt use it- last study years ago   Vertigo  Past Surgical History:  Procedure Laterality Date   ABDOMINAL HYSTERECTOMY  1983   CARDIAC CATHETERIZATION N/A 04/30/2015   Procedure: Left Heart Cath and Coronary Angiography;  Surgeon: Peter M Swaziland, MD;  Location: [facility] INVASIVE CV LAB;  Service: Cardiovascular;  Laterality: N/A;   CHOLECYSTECTOMY  2001   CYSTOSCOPY W/ RETROGRADES Left 08/02/2015   Procedure: CYSTOSCOPY WITH LEFT RETROGRADE PYELOGRAM;  Surgeon: Jerilee Field, MD;  Location: [facility] ORS;  Service: Urology;  Laterality: Left;   CYSTOSCOPY WITH URETEROSCOPY, STONE BASKETRY AND STENT PLACEMENT Left 08/02/2015   Procedure:  Geoffry Paradise ;  Surgeon: Jerilee Field, MD;  Location: [facility] ORS;  Service: Urology;  Laterality: Left;   CYSTOSCOPY/URETEROSCOPY/HOLMIUM LASER/STENT  PLACEMENT Left 08/02/2015   Procedure: CYSTOSCOPY/URETEROSCOPY/HOLMIUM LASER/STENT PLACEMENT-LEFT;  Surgeon: Jerilee Field, MD;  Location: [facility] ORS;  Service: Urology;  Laterality: Left;   HOLMIUM LASER APPLICATION Left 08/02/2015   Procedure: HOLMIUM LASER APPLICATION;  Surgeon: Jerilee Field, MD;  Location: [facility] ORS;  Service: Urology;  Laterality: Left;   REPLACEMENT ASCENDING AORTA N/A 03/03/2014   Procedure: REPAIR OF ASCENDING AORTIC DISSECTION;  Surgeon: Delight Ovens, MD;  Location: [facility] OR;  Service: Open Heart Surgery;  Laterality: N/A;   RIGHT/LEFT HEART CATH AND CORONARY ANGIOGRAPHY N/A 10/16/2022   Procedure: RIGHT/LEFT HEART CATH AND CORONARY ANGIOGRAPHY;  Surgeon: Elder Negus, MD;  Location: [facility] INVASIVE CV LAB;  Service: Cardiovascular;  Laterality: N/A;   TEE WITHOUT CARDIOVERSION N/A 09/30/2022   Procedure: TRANSESOPHAGEAL ECHOCARDIOGRAM;  Surgeon: Wendall Stade, MD;  Location: [facility] INVASIVE CV LAB;  Service: Cardiovascular;  Laterality: N/A;    Family History  Problem Relation Age of Onset   Breast cancer Mother 20   Aortic dissection Mother 26   Hypertension Mother    Hypertension Father    Breast cancer Sister    Hypertension Sister    Throat cancer Brother        1 bro living   Colon cancer Brother    Esophageal cancer Brother    Breast cancer Daughter    Hypertension Daughter    Liver cancer Daughter    Colon cancer Daughter    Breast cancer Niece    Seizures Neg Hx    Pancreatic cancer Neg Hx    Rectal cancer Neg Hx    Stomach cancer Neg Hx     Social History Social History   Tobacco Use   Smoking status: Never   Smokeless tobacco: Never   Tobacco comments:    separated spring 2013- lives with 1 dtr -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 30 tablet 0   amLODipine (NORVASC) 10 MG tablet Take 1 tablet (10 mg total) by mouth daily. 90 tablet 3   apixaban (ELIQUIS) 5 MG TABS tablet Take 1 tablet by mouth 2 (two) times daily.     aspirin EC 81 MG tablet Take 81 mg by mouth daily. Swallow whole.     atorvastatin (LIPITOR) 40 MG tablet TAKE 1 TABLET BY MOUTH ONCE DAILY AT 6:00 IN THE  EVENING 90 tablet 3   carvedilol (COREG) 12.5 MG tablet Take 1 tablet (12.5 mg  total) by mouth 2 (two) times daily. 180 tablet 3   colesevelam (WELCHOL) 625 MG tablet Take 1 tablet (625 mg total) by mouth every other day. As needed (Patient taking differently: Take 625 mg by mouth daily as needed (diarrhea).) 30 tablet 0   diclofenac Sodium (VOLTAREN) 1 % GEL Apply 2-4 g topically 4 (four) times daily as needed (pain).     folic acid (FOLVITE) 1 MG tablet Take 1 mg by mouth daily.     loratadine (CLARITIN) 10 MG tablet Take 1 tablet (10 mg total) by mouth daily. (Patient taking differently: Take 10 mg by mouth daily as needed for allergies.) 30 tablet 0   losartan (COZAAR) 100 MG tablet Take 100 mg by mouth daily.     potassium chloride SA (KLOR-CON M) 20 MEQ tablet Take 2 tablets (40 mEq total) by mouth once for 1 dose. (Patient taking differently: Take 20 mEq by mouth every other day.) 2 tablet 0   No current facility-administered medications for this visit.    Allergies  Allergen Reactions   Hydralazine Nausea Only   Motrin [Ibuprofen] Nausea Only    Upset GI (motrin brand causes the side effect)    Review of Systems  Constitutional:  Positive for fatigue.  HENT: Negative.    Eyes: Negative.   Respiratory:  Positive for shortness of breath.   Cardiovascular:  Negative for chest pain and leg swelling.  Gastrointestinal: Negative.   Endocrine: Negative.   Genitourinary: Negative.   Musculoskeletal:  Positive for  arthralgias.  Skin: Negative.   Allergic/Immunologic: Negative.   Neurological:  Negative for dizziness and syncope.  Hematological: Negative.   Psychiatric/Behavioral: Negative.      BP (!) 168/76 (BP Location: Right Arm, Patient Position: Sitting)   Pulse 60   Resp 18   Ht 5\' 3"  (1.6 m)   Wt 200 lb (90.7 kg)   SpO2 95% Comment: RA  BMI 35.43 kg/m  Physical Exam Constitutional:      Appearance: She is obese.  HENT:     Head: Normocephalic and atraumatic.  Eyes:     Extraocular Movements: Extraocular movements intact.     Conjunctiva/sclera: Conjunctivae normal.     Pupils: Pupils are equal, round, and reactive to light.  Neck:     Vascular: No carotid bruit.  Cardiovascular:     Rate and Rhythm: Normal rate and regular rhythm.     Pulses: Normal pulses.     Heart sounds: Murmur heard.     Comments: 2/6 diastolic murmur LLSB Pulmonary:     Effort: Pulmonary effort is normal.     Breath sounds: Normal breath sounds.  Abdominal:     General: There is no distension.     Tenderness: There is no abdominal tenderness.  Musculoskeletal:        General: No swelling or tenderness.     Cervical back: Normal range of motion and neck supple.  Skin:    General: Skin is warm and dry.  Neurological:     General: No focal deficit present.     Mental Status: She is alert and oriented to person, place, and time.  Psychiatric:        Mood and Affect: Mood normal.        Behavior: Behavior normal.      Diagnostic Tests:    TRANSESOPHOGEAL ECHO REPORT       Patient Name:   [name] Date of Exam: 09/30/2022  Medical Rec #:  [id number]  Height:  Accession #:    [id number]           Weight:  Date of Birth:  04/06/1955            BSA:  Patient Age:    67 years            BP:           172/89 mmHg  Patient Gender: F                   HR:           68 bpm.  Exam Location:  Inpatient   Procedure: Transesophageal Echo, 3D Echo, Cardiac Doppler and Color  Doppler    Indications:    Aortic Valve Abnormality    History:         Patient has prior history of Echocardiogram examinations,  most                  recent 09/08/2022.    Sonographer:     Darlys Gales  Referring Phys:  Charlton Haws MD  Diagnosing Phys: Charlton Haws MD   PROCEDURE: After discussion of the risks and benefits of a TEE, an  informed consent was obtained from the patient. The transesophogeal probe  was passed without difficulty through the esophogus of the patient.  Sedation performed by different physician.  The patient was monitored while under deep sedation. Anesthestetic  sedation was provided intravenously by Anesthesiology: 315mg  of Propofol,  20mg  of Lidocaine. The patient developed no complications during the  procedure.    IMPRESSIONS     1. Left ventricular ejection fraction, by estimation, is 55 to 60%. The  left ventricle has normal function. The left ventricular internal cavity  size was mildly dilated. There is mild left ventricular hypertrophy.   2. Right ventricular systolic function is normal. The right ventricular  size is normal.   3. Left atrial size was mildly dilated. No left atrial/left atrial  appendage thrombus was detected.   4. The mitral valve is normal in structure. Mild mitral valve  regurgitation.   5. 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 The aortic valve is tricuspid. Aortic valve  regurgitation is moderate to severe.   6. 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 No  communications to true lumen seen by color flow . Aortic dilatation  noted.  There is severe dilatation of the  aortic root, measuring 48 mm.   FINDINGS   Left Ventricle: Left ventricular ejection fraction, by estimation, is 55  to 60%. The left ventricle has normal function. The left ventricular  internal cavity size was mildly dilated. There is mild left ventricular  hypertrophy.   Right Ventricle: The right ventricular size is normal. Right vetricular  wall thickness was not assessed. Right ventricular systolic function is  normal.   Left Atrium: Left atrial size was mildly dilated. No left atrial/left  atrial appendage thrombus was detected.   Right Atrium: Right atrial size was normal in size.   Pericardium: There is no evidence of  pericardial effusion.   Mitral Valve: The mitral valve is normal in structure. Mild mitral valve  regurgitation.   Tricuspid Valve: The tricuspid valve is normal in structure. Tricuspid  valve regurgitation is mild.   Aortic Valve: Moderate to severe eccentric AR. Mechanism is prior  dissection with AV resuspension Persistent severely dilated sinuses  especially left sinus with central failure to co apt Also concern for  perforation of left sinus AR PISA radius 0.6 cm AR   jet width in LVOT 0.6 cm 3D Vena contracta width 0.49 cm2 AR ERO 0.33 cm2  and AR regurgitant volume 50 cc. The aortic valve is tricuspid. Aortic  valve regurgitation is moderate to severe.   Pulmonic Valve: The pulmonic valve was normal in structure. Pulmonic valve  regurgitation is trivial.   Aorta: 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 No  communications to true lumen seen by color flow. Aortic dilatation noted.  There is severe dilatation of the  aortic root, measuring 48 mm.   IAS/Shunts: No atrial level shunt detected by color flow Doppler.   Charlton Haws MD  Electronically signed by Charlton Haws MD  Signature Date/Time:  09/30/2022/3:53:32 PM        Final     Physicians  Panel Physicians Referring Physician Case Authorizing Physician  Elder Negus, MD (Primary)     Procedures  RIGHT/LEFT HEART CATH AND CORONARY ANGIOGRAPHY   Conclusion  RHC/LHC 10/16/2022: LM: Minimal luminal irregularities LAD: No significant disease Lcx: No significant disease RCA: No significant disease   RA: 0 mmHg RV: 23/1 mmHg PA: 22/9 mmHg, mPAP 16 mmHg PCW: 4 mmHg   CO: 6.2 L/min CI: 3.2 L/min/m2\   No significant coronary artery disease Normal filling pressures   Manish Emiliano Dyer, MD     Recommendations  Antiplatelet/Anticoag No clear indication for Aspirin for primary prevention from coronary standpoint   Surgeon Notes    09/30/2022 12:04 PM CV Procedure signed by Wendall Stade, MD   Clinical Presentation  CHF/Shock Congestive heart failure not present. No shock present.   Procedural Details  Technical Details Procedures: 1. Right heart catheterization 2. Left heart catheterization 3. Selective left and right coronary angiography 4. Conscious sedation monitoring 20 min  Indication: Aortic regurgitation  History: 19 African-American female with hypertension, hyperlipidemia, h/o dilated cardiomyopathy with recovered LVEF, h/o ascending aorta dissection s/p repair with 30 mm Hemashield platinum graft, h/o DVT/PE, OSA on CPAP, now with moderate to severe leg regurgitation.  Patient was recommended return after catheterization for possible preop workup.   Diagnostic Angiography: Catheter/s advances over guidewire under fluoroscopy Left coronary artery: 5 Fr JL 5  Right coronary artery: 5 Fr JR 4 Left heart catheterization: 5 Fr TIG   Pressures tracings obtained in right atrium, right ventricle, pulmonary artery, and pulmonary capillary wedge position. Invasive hemodynamic measurements performed using Fick method.     Anticoagulation:  4500 units heparin  Hemostasis: TR  band  Total contrast used: 45 cc   Total fluoro time: 4.7 min Air Kerma: 84 mGy  Conscious sedation was administered under my direct supervision. IV Versed 1 mg and fentanyl 25 mcg administered. Continuous ECG, pulse oximetry and blood pressure were monitored throughout the entire procedure. Monitoring corroborated by cath lab nurse and technician.  Total sedation time: 20 minutes.  All wires and catheters removed out of the body at the end of the procedure Final angiogram showed no  dissection/perforation.   [image] Elder Negus, MD Pager: [phone] Office: [phone]          Estimated blood loss <50 mL.   During this procedure medications were administered to achieve and maintain moderate conscious sedation while the patient's heart rate, blood pressure, and oxygen saturation were continuously monitored and I was present face-to-face 100% of this time.   Medications (Filter: Administrations occurring from 0815 to 0910 on 10/16/22) lidocaine (PF) (XYLOCAINE) 1 % injection (mL)  Total volume: 4 mL Date/Time Rate/Dose/Volume Action   10/16/22 0839 2 mL Given   0840 2 mL Given   fentaNYL (SUBLIMAZE) injection (mcg)  Total dose: 25 mcg Date/Time Rate/Dose/Volume Action   10/16/22 0839 25 mcg Given   midazolam (VERSED) injection (mg)  Total dose: 1 mg Date/Time Rate/Dose/Volume Action   10/16/22 0837 1 mg Given   Heparin (Porcine) in NaCl 1000-0.9 UT/500ML-% SOLN (mL)  Total volume: 1,000 mL Date/Time Rate/Dose/Volume Action   10/16/22 0840 500 mL Given   0900 500 mL Given   iohexol (OMNIPAQUE) 350 MG/ML injection (mL)  Total volume: 45 mL Date/Time Rate/Dose/Volume Action   10/16/22 0904 45 mL Given   heparin sodium (porcine) injection (Units)  Total dose: 4,500 Units Date/Time Rate/Dose/Volume Action   10/16/22 0848 4,500 Units Given   Radial Cocktail/Verapamil only (mL)  Total volume: 10 mL Date/Time Rate/Dose/Volume Action   10/16/22 0841 10  mL Given    Sedation Time  Sedation Time Physician-1: 20 minutes 16 seconds Contrast     Administrations occurring from 0815 to 0910 on 10/16/22:  Medication Name Total Dose  iohexol (OMNIPAQUE) 350 MG/ML injection 45 mL   Radiation/Fluoro  Fluoro time: 4.7 (min) DAP: 6.9 (Gycm2) Cumulative Air Kerma: 84 (mGy) Complications  Complications documented before study signed (10/16/2022  9:14 AM)   No complications were associated with this study.  Documented by Loma Messing B - 10/16/2022  9:01 AM     Coronary Findings  Diagnostic Dominance: Right Left Main  Vessel is large. The vessel exhibits minimal luminal irregularities.    Left Anterior Descending  Vessel is large. The vessel exhibits minimal luminal irregularities. The vessel is ectatic.    Left Circumflex  Vessel is large. Vessel is angiographically normal.    Right Coronary Artery  Vessel is large. The vessel exhibits minimal luminal irregularities.    Intervention   No interventions have been documented.   Right Heart  Right Heart Pressures RA: 0 mmHg RV: 23/1 mmHg PA: 22/9 mmHg, mPAP 16 mmHg PCW: 4 mmHg  CO: 6.2 L/min CI: 3.2 L/min/m2   Coronary Diagrams  Diagnostic Dominance: Right  Intervention   Implants   No implant documentation for this case.   Syngo Images   Show images for CARDIAC CATHETERIZATION Images on Long Term Storage   Show images for Latigra, Mikrut to Procedure Log  Procedure Log   Link to Procedure Log  Procedure Log    Hemo Data  Flowsheet Row Most Recent Value  Fick Cardiac Output 6.21 L/min  Fick Cardiac Output Index 3.22 (L/min)/BSA  Aortic Mean Gradient 8.8 mmHg  Aortic Peak Gradient 9.8 mmHg  Aortic Valve Area 2.45  Aortic Value Area Index 1.27 cm2/BSA  RA A Wave 4 mmHg  RA V Wave 5 mmHg  RA Mean -1 mmHg  RV Systolic Pressure 23 mmHg  RV Diastolic Pressure 1 mmHg  RV EDP 5 mmHg  PA Systolic Pressure 22 mmHg  PA Diastolic Pressure 9  mmHg  PA Mean  16 mmHg  PW A Wave 11 mmHg  PW V Wave 9 mmHg  PW Mean 4 mmHg  AO Systolic Pressure 150 mmHg  AO Diastolic Pressure 53 mmHg  AO Mean 93 mmHg  LV Systolic Pressure 147 mmHg  LV Diastolic Pressure 0 mmHg  LV EDP 18 mmHg  AOp Systolic Pressure 148 mmHg  AOp Diastolic Pressure 51 mmHg  AOp Mean Pressure 86 mmHg  LVp Systolic Pressure 148 mmHg  LVp Diastolic Pressure 3 mmHg  LVp EDP Pressure 18 mmHg  QP/QS 1  TPVR Index 4.97 HRUI  TSVR Index 28.87 HRUI  TPVR/TSVR Ratio 0.17    Impression:  This 67 year old woman has developed moderate to severe AI after aortic dissection repair with resuspension of the aortic valve in 2016. She is starting to have symptoms of exertional shortness of breath and fatigue. She only had trivial AI following her repair which has gradually worsened but went from mild to moderate/severe over the past two years. I would expect this to continue to worsen and become more symptomatic. There has also been some enlargement of the aortic root by echo. She has normal LV systolic function and internal dimensions at this time but that could worsen if this is not treated. Surgical treatment would require redo sternotomy for AVR or root replacement which would be a higher risk procedure due to her previous aortic dissection repair and obesity which make cannulation for cardiopulmonary bypass much more challenging and root replacement more complicated. I reviewed her echo and cath images with her and surgical options for treatment. She will require a CTA of the chest, abdomen and pelvis to measure the aorta and followup on her chronic residual dissection and plan surgical treatment.  Plan:  She will be scheduled for CTA of the chest, abdomen and pelvis. I will see her back afterwards to review the results with her and discuss surgical treatment further.  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    [name], MD [facility] [phone]

## 2022-10-23 ENCOUNTER — Ambulatory Visit: Payer: Medicare Other | Attending: Nurse Practitioner

## 2022-10-23 DIAGNOSIS — E876 Hypokalemia: Secondary | ICD-10-CM

## 2022-10-24 LAB — BASIC METABOLIC PANEL
BUN/Creatinine Ratio: 13 (ref 12–28)
BUN: 13 mg/dL (ref 8–27)
CO2: 24 mmol/L (ref 20–29)
Calcium: 9.1 mg/dL (ref 8.7–10.3)
Chloride: 105 mmol/L (ref 96–106)
Creatinine, Ser: 1.03 mg/dL — ABNORMAL HIGH (ref 0.57–1.00)
Glucose: 87 mg/dL (ref 70–99)
Potassium: 3.9 mmol/L (ref 3.5–5.2)
Sodium: 144 mmol/L (ref 134–144)
eGFR: 59 mL/min/{1.73_m2} — ABNORMAL LOW (ref >59)

## 2022-11-02 NOTE — Progress Notes (Unsigned)
Cardiology Office Note    Patient Name: Alexis Wheeler Date of Encounter: 11/02/2022  Primary Care Provider:  [name], MD Primary Cardiologist:  [name], MD Primary Electrophysiologist: None   Past Medical History    Past Medical History:  Diagnosis Date   Anemia    childhood   Aortic insufficiency    Moderate to severe by echo 08/2022   Arthritis    right knee   Ascending aortic dissection [facility])    s/p repair by Dr. [name].  2D echo 07/2020 with aortic root 41mm   Blood transfusion without reported diagnosis    with heart surgery-repair aorta   CAD (coronary artery disease)    a. 04/2015: cath showed 30% Ost RCA stenosis, 20% mid-LAD stenosis, and normal LV function.    Cataract    bilateral eyes, per pt.   Congestive dilated cardiomyopathy (HCC) 08/01/2014   DVT (deep venous thrombosis) (HCC)    right   GERD    HEMATURIA UNSPECIFIED    History of kidney stones    HYPERLIPIDEMIA    HYPERTENSION    Morbid obesity (HCC)    Myocardial infarction (HCC)    01/30/2014   MYOSITIS    OSA on CPAP 05/10/2017   mild OSA with an AHI of 5.2/hr overall but severe during REM sleep with an AHI of 41.4/hr and underwent CPAP titration to 12cm H2O   Pulmonary embolism with acute cor pulmonale (HCC) 03/2020   Saddle embolism with acute cor pulmonale   Sleep apnea    cpap-  doesnt use it- last study years ago   Vertigo     History of Present Illness  Alexis Wheeler is a 67 y.o. 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   [name] was last seen on 09/22/2022 for pre-TEE evaluation due to severe aortic regurgitation seen on 2D echo.  During visit patient reported ongoing fatigue and chest pain with activity.  TTE was completed on 09/30/2022  and revealed moderate to severe AR and patient was referred to Dr. Laneta Simmers for further evaluation who recommended [facility].  She underwent heart catheterization on 10/16/2022 that revealed no significant CAD with normal filling pressures with CI of 3.2.  She was seen in consult on 10/21/2022 by Dr. Laneta Simmers who scheduled patient for CTA of the chest abdomen and pelvis with plan to follow-up and review results to discuss surgical treatment further.  During today's visit the patient reports she is still experiencing increased fatigue reports no chest pain since follow-up.  Her blood pressure today was controlled at 136/72 and heart rate was 59 bpm.  She is currently tolerating her medications without any adverse reactions.  Today's visit we discussed her upcoming treatment plan Dr. Laneta Simmers and patient had all questions answered to her satisfaction.  She was very concerned regarding her upcoming surgery very anxious to have her CT scans completed.  She was encouraged to abstain from extremely strenuous activities blood pressure daily.  Patient denies chest pain, palpitations, dyspnea, PND, orthopnea, nausea, vomiting, dizziness, syncope, edema, weight gain, or early satiety.   Review of Systems  Please see the history of present illness.    All other systems reviewed and are otherwise negative except as noted above.  Physical Exam    Wt Readings  from Last 3 Encounters:  10/21/22 200 lb (90.7 kg)  10/20/22 198 lb (89.8 kg)  10/16/22 198 lb (89.8 kg)   [id number] were no vitals filed for this visit.,There is no height or weight on file to calculate BMI. GEN: Well nourished, well developed in no acute distress Neck: No JVD; No carotid bruits Pulmonary: Clear to auscultation without rales, wheezing or rhonchi  Cardiovascular: Normal rate. Regular rhythm. Normal S1. Normal S2.   Murmurs: There is no murmur.  ABDOMEN: Soft, non-tender, non-distended EXTREMITIES:  No edema; No deformity   EKG/LABS/ Recent  Cardiac Studies   ECG personally reviewed by me today -completed today  Risk Assessment/Calculations:        Lab Results  Component Value Date   WBC 6.2 10/20/2022   HGB 12.2 10/20/2022   HCT 37.8 10/20/2022   MCV 72.4 (L) 10/20/2022   PLT 192 10/20/2022   Lab Results  Component Value Date   CREATININE 1.03 (H) 10/23/2022   BUN 13 10/23/2022   NA 144 10/23/2022   K 3.9 10/23/2022   CL 105 10/23/2022   CO2 24 10/23/2022   Lab Results  Component Value Date   CHOL 139 05/06/2022   HDL 51 05/06/2022   LDLCALC 72 05/06/2022   TRIG 84 05/06/2022   CHOLHDL 2.7 05/06/2022    Lab Results  Component Value Date   HGBA1C 6.0 (H) 08/31/2016   Assessment & Plan   1.  Nonrheumatic aortic regurgitation: -2D echo recently completed showing EF of 55 to 60% with moderate to severe aortic with TEE completed showing conferring moderate to severe AR from sinus dilation and perforation left cusp.  She was referred to Dr. Laneta Simmers and is scheduled to undergo CTA of the abdomen pelvis and chest prior to surgical intervention. -Today patient reports ongoing fatigue and tiredness but denies any chest pain or lower extremity swelling. -Continue current GDMT with carvedilol 12.5 mg twice daily and losartan 25 mg daily.  2.  Coronary artery disease: -s/p LHC completed 10/16/22 showing no significant CAD with normal filling pressures -Today patient reports no chest pain or anginal equivalent since previous follow-up. -Continue current GDMT with carvedilol 12.5 mg twice daily, losartan 25 mg daily, Lipitor 40 mg daily WelChol 625 mg daily   3.  Essential hypertension: -Patient's blood pressure was controlled at 136/72 -Continue Norvasc 10 mg daily, carvedilol 12.5 mg twice daily, losartan 25 mg daily   4.  Ascending aortic dissection: -s/p AR repair with 30 mm Hemashield platinum graft 02/2014 -Patient scheduled to undergo CT of abdomen and chest   5.  Chronic DVT and saddle PE: -Continue Eliquis  5 mg twice daily  Disposition: Follow-up with [name], MD or APP in 3 months    Signed, Napoleon Form, Leodis Rains, NP 11/02/2022, 5:17 PM [facility]

## 2022-11-03 ENCOUNTER — Ambulatory Visit: Payer: Medicare Other | Attending: Nurse Practitioner | Admitting: Nurse Practitioner

## 2022-11-03 ENCOUNTER — Encounter: Payer: Self-pay | Admitting: Nurse Practitioner

## 2022-11-03 VITALS — BP 136/72 | HR 59 | Resp 16 | Ht 63.0 in | Wt 199.0 lb

## 2022-11-03 DIAGNOSIS — I351 Nonrheumatic aortic (valve) insufficiency: Secondary | ICD-10-CM | POA: Diagnosis not present

## 2022-11-03 DIAGNOSIS — I2510 Atherosclerotic heart disease of native coronary artery without angina pectoris: Secondary | ICD-10-CM

## 2022-11-03 DIAGNOSIS — I10 Essential (primary) hypertension: Secondary | ICD-10-CM

## 2022-11-03 DIAGNOSIS — I2583 Coronary atherosclerosis due to lipid rich plaque: Secondary | ICD-10-CM

## 2022-11-03 DIAGNOSIS — I7101 Dissection of ascending aorta: Secondary | ICD-10-CM

## 2022-11-03 DIAGNOSIS — Z86711 Personal history of pulmonary embolism: Secondary | ICD-10-CM

## 2022-11-03 NOTE — Patient Instructions (Signed)
Medication Instructions:  Your physician recommends that you continue on your current medications as directed. Please refer to the Current Medication list given to you today. *If you need a refill on your cardiac medications before your next appointment, please call your pharmacy*   Lab Work: None ordered   Testing/Procedures: None ordered   Follow-Up: At [facility], you and your health needs are our priority.  As part of our continuing mission to provide you with exceptional heart care, we have created designated Provider Care Teams.  These Care Teams include your primary Cardiologist (physician) and Advanced Practice Providers (APPs -  Physician Assistants and Nurse Practitioners) who all work together to provide you with the care you need, when you need it.  We recommend signing up for the patient portal called "MyChart".  Sign up information is provided on this After Visit Summary.  MyChart is used to connect with patients for Virtual Visits (Telemedicine).  Patients are able to view lab/test results, encounter notes, upcoming appointments, etc.  Non-urgent messages can be sent to your provider as well.   To learn more about what you can do with MyChart, go to [email or web address].    Your next appointment:   3 month(s)  Provider:   [name], MD     Other Instructions

## 2022-11-05 ENCOUNTER — Ambulatory Visit
Admission: RE | Admit: 2022-11-05 | Discharge: 2022-11-05 | Disposition: A | Discharge status: Home / Self Care | Payer: Medicare Other | Source: Ambulatory Visit | Attending: Surgery | Admitting: Surgery

## 2022-11-05 DIAGNOSIS — I351 Nonrheumatic aortic (valve) insufficiency: Secondary | ICD-10-CM

## 2022-11-05 MED ORDER — IOPAMIDOL (ISOVUE-370) INJECTION 76%
100.0000 mL | Freq: Once | INTRAVENOUS | Status: AC | PRN
  Administered 2022-11-05: 100 mL via INTRAVENOUS

## 2022-11-07 ENCOUNTER — Other Ambulatory Visit: Payer: Self-pay | Admitting: Gastroenterology

## 2022-11-09 ENCOUNTER — Telehealth: Payer: Self-pay | Admitting: *Deleted

## 2022-11-09 NOTE — Telephone Encounter (Signed)
Patient contacted answering service over the weekend regarding constant pain in legs. Per patient, on call provider returned call and addressed concerns. Nothing further at this time.

## 2022-11-25 ENCOUNTER — Encounter: Payer: Self-pay | Admitting: Surgery

## 2022-11-25 ENCOUNTER — Ambulatory Visit: Payer: Medicare Other | Admitting: Surgery

## 2022-11-25 VITALS — BP 140/81 | HR 62 | Resp 20 | Wt 199.0 lb

## 2022-11-25 DIAGNOSIS — I351 Nonrheumatic aortic (valve) insufficiency: Secondary | ICD-10-CM

## 2022-11-29 NOTE — Progress Notes (Signed)
HPI:  The patient is a 67 year old woman with a history of hypertension, hyperlipidemia, obesity, OSA on CPAP, congestive dilated cardiomyopathy, DVT/PE with saddle embolism and acute cor pulmonale in 03/2020, and acute type 1 aortic dissection with severe AI in 02/2014 who underwent repair with a 30 mm Hemashield tube graft using deep hypothermic circulatory arrest via the right axillary artery and resuspension repair of the aortic valve. Her postop echo in July 2016 showed only trivial AI with an aortic root diameter of 42 mm and an EF of 55-60%. AI was mild in 03/2018 and 03/2019. AI was moderate in 03/2020 and 07/2020 with a pressure half time of 359 msec. Her most recent echo on 09/08/2022 showed eccentric moderate to severe AI with a PHT of 354 msec and a vena contracta of 1 cm but no diastolic flow reversal in the descending aorta. LV diastolic diameter was 4.85 cm. Aortic root diameter was measured at 4.5 cm. She presented to the ER 08/13/2022 with chest heaviness. ECG and troponin were negative. Her pain was resolved with NSAIDs. She saw Dr. [name] and had an ETT that showed no ST changes but was inadequate due to poor exercise capacity. A TEE showed a dilated aortic root to 4.8 cm with lack of central coaptation of the aortic valve leaflets and moderate to severe AI with a VC of 0.49 cm2, regurgitant volume of 50 cc, AI ERO of 0.33 cm2 and AI PISA radius of 0.6 cm. LV and RV systolic function were normal. Cath showed no significant coronary artery disease with normal PA pressures and CI 3.2.  She returns today to review the results of her recent CTA of the chest/abd/pelvis which was done to evaluate the aortic root and the residual chronic aortic dissection since there was documented enlargement of the aortic root on echocardiogram.  She denies any chest discomfort.  She has had some exertional fatigue and shortness of breath that is mild. Current Outpatient Medications  Medication Sig Dispense Refill    acetaminophen (TYLENOL) 500 MG tablet Take 2 tablets (1,000 mg total) by mouth every 6 (six) hours as needed. 30 tablet 0   amLODipine (NORVASC) 10 MG tablet Take 1 tablet (10 mg total) by mouth daily. 90 tablet 3   apixaban (ELIQUIS) 5 MG TABS tablet Take 1 tablet by mouth 2 (two) times daily.     aspirin EC 81 MG tablet Take 81 mg by mouth daily. Swallow whole.     atorvastatin (LIPITOR) 40 MG tablet TAKE 1 TABLET BY MOUTH ONCE DAILY AT 6:00 IN THE  EVENING 90 tablet 3   carvedilol (COREG) 12.5 MG tablet Take 1 tablet (12.5 mg total) by mouth 2 (two) times daily. 180 tablet 3   colesevelam (WELCHOL) 625 MG tablet Take 1 tablet (625 mg total) by mouth every other day. As needed (Patient taking differently: Take 625 mg by mouth daily as needed (diarrhea).) 30 tablet 0   diclofenac Sodium (VOLTAREN) 1 % GEL Apply 2-4 g topically 4 (four) times daily as needed (pain).     folic acid (FOLVITE) 1 MG tablet Take 1 mg by mouth daily.     loratadine (CLARITIN) 10 MG tablet Take 1 tablet (10 mg total) by mouth daily. (Patient taking differently: Take 10 mg by mouth daily as needed for allergies.) 30 tablet 0   losartan (COZAAR) 100 MG tablet Take 100 mg by mouth daily.     potassium chloride SA (KLOR-CON M) 20 MEQ tablet Take 2  tablets (40 mEq total) by mouth once for 1 dose. (Patient taking differently: Take 20 mEq by mouth every other day.) 2 tablet 0   No current facility-administered medications for this visit.     Physical Exam: BP (!) 140/81   Pulse 62   Resp 20   Wt 199 lb (90.3 kg)   SpO2 99% Comment: RA  BMI 35.25 kg/m  She looks well. Cardiac exam shows a regular rate and rhythm with a 2/6 diastolic murmur along the left lower sternal border. Lungs clear. There is no peripheral edema.   Diagnostic Tests:  Narrative & Impression  CLINICAL DATA:  Preoperative evaluation for aortic valve insufficiency.   EXAM: CT ANGIOGRAPHY CHEST, ABDOMEN AND PELVIS   TECHNIQUE: Non-contrast  CT of the chest was initially obtained.   Multidetector CT imaging through the chest, abdomen and pelvis was performed using the standard protocol during bolus administration of intravenous contrast. Multiplanar reconstructed images and MIPs were obtained and reviewed to evaluate the vascular anatomy.   RADIATION DOSE REDUCTION: This exam was performed according to the departmental dose-optimization program which includes automated exposure control, adjustment of the mA and/or kV according to patient size and/or use of iterative reconstruction technique.   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 Surgical changes of prior tube graft repair of the ascending thoracic aorta. No evidence of complication at the proximal or distal anastomosis. Residual type 2 thoracic aortic dissection extending into the proximal brachiocephalic artery. No involvement of the carotid or left subclavian arteries. The dissection extends through the arch and descending thoracic aorta and into the abdomen. Mild aneurysmal dilation of the proximal descending thoracic aorta to a maximum of 4 cm.   The main pulmonary artery is normal in caliber. The heart is normal in size. No pericardial effusion.   Mediastinum/Nodes: Scattered tiny thyroid cysts/nodules. None meet size criteria to warrant further evaluation. No imaging follow-up is recommended. No mediastinal mass or suspicious adenopathy. Unremarkable esophagus.   Lungs/Pleura: Lungs are clear. No pleural effusion or pneumothorax.   Musculoskeletal: No acute fracture or aggressive appearing lytic or blastic osseous lesion. Healed  median sternotomy.   Review of the MIP images confirms the above findings.   CTA ABDOMEN AND PELVIS FINDINGS   VASCULAR   Aorta: Thoracoabdominal aortic dissection extends through the aorta into the distal infrarenal segment terminating just proximal to the bifurcation. Mild scattered plaque. Minimal aneurysmal dilation of the visceral abdominal aorta with a maximal diameter of 3.2 cm.   Celiac: Arises from the true lumen. No dissection or aneurysm. Conventional hepatic arterial anatomy.   SMA: Arises from the true lumen.  Widely patent.   Renals: Single right renal artery arises from the true lumen. No involvement by the dissection flap. No aneurysm. On the left, there are 2 renal arteries. A smaller accessory renal artery arises from the true lumen and provides the majority of flow to the renal parenchyma. The upper pole renal artery arises from the false lumen and is poorly opacified.   IMA: Arises from the true lumen.  Atretic but patent.   Inflow: Patent without evidence of aneurysm, dissection, vasculitis or significant stenosis.   Veins: No focal  venous abnormality.   Review of the MIP images confirms the above findings.   NON-VASCULAR   Hepatobiliary: No focal liver abnormality is seen. Status post cholecystectomy. No biliary dilatation.   Pancreas: Unremarkable. No pancreatic ductal dilatation or surrounding inflammatory changes.   Spleen: Normal in size without focal abnormality.   Adrenals/Urinary Tract: Stable left adrenal adenoma at approximately 1.8 cm. The right adrenal gland is normal. No hydronephrosis, no hydronephrosis or enhancing renal mass. Punctate nonobstructing stone in the upper pole collecting system of the left kidney. Diffuse atrophy of the upper half of the left kidney due to hypoperfusion from the left main renal artery arising from the false lumen. The ureters and bladder are unremarkable.   Stomach/Bowel: No focal bowel wall  thickening or evidence of obstruction.   Lymphatic: No suspicious lymphadenopathy.   Reproductive: Status post hysterectomy. No adnexal masses.   Other: No abdominal wall hernia or abnormality. No abdominopelvic ascites.   Musculoskeletal: No acute fracture or aggressive appearing lytic or blastic osseous lesion. Mild grade 1 anterolisthesis of L4 on L5.   Review of the MIP images confirms the above findings.   IMPRESSION: VASCULAR   1. 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 2. Prior open tube graft repair of the ascending thoracic aorta without evidence of complication at the proximal or distal anastomosis. 3. Residual Stanford type B thoracoabdominal dissection extending into the proximal right brachiocephalic artery and resulting in mild aneurysmal dilation of the proximal descending thoracic aorta to a maximum of 4 cm and the proximal abdominal aorta to 3.1 cm. Societal recommendations do not apply to aneurysmal dilation of chronic dissection. 4. The left main renal artery arises from the false lumen and is significantly hypoperfused. A smaller accessory left renal artery provides supply to the lower pole. 5. Access vessels are intact and unremarkable bilaterally.   NON VASCULAR   1. No acute cardiopulmonary process. 2. No acute abnormality within the abdomen or pelvis. 3. 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 4. Mild grade 1 anterolisthesis of L4 on L5. 5. Additional ancillary findings as above without significant interval change.   Signed,   Sterling Big, MD, RPVI   [facility]   [facility]     Electronically Signed   By: Malachy Moan M.D.   On: 11/21/2022 11:26      Impression:  She has had progressive aortic root dilatation to 5.1 cm with worsening aortic insufficiency  which is now moderate to severe.  She has NYHA class II symptoms of exertional fatigue and shortness of breath.  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  It extends up into the innominate artery.  The left renal artery arises from the false lumen and is significantly hypoperfused.  The accessory left renal artery is smaller and supplies the lower pole.  The remainder of the abdominal vessels come off of the true lumen.  The proximal descending thoracic aorta measures 4 cm.  The proximal abdominal aorta measures 3.1 cm.  I think the best option is to proceed with redo sternotomy for aortic root replacement using a bioprosthetic valve graft.  She is becoming symptomatic and I would expect her aortic insufficiency to continue to worsen.  Her operative risk is increased due to her previous aortic dissection, prior surgery, and morbid obesity.  I reviewed the CTA images with her and her friend and answered her questions.  I discussed the operative procedure with the patient including alternatives, benefits and risks; including but not limited to bleeding, blood transfusion, infection, stroke, myocardial infarction, graft failure, heart block requiring a permanent pacemaker, organ dysfunction, and death.  [name] understands and agrees to proceed.    Plan:  She is going to think about the timing of surgery but probably will want to wait until after the holidays which I think is fine as long as her symptoms are stable.  I spent 15 minutes performing this established patient evaluation and > 50% of this time was spent face to face counseling and coordinating the care of this patient's aortic root aneurysm and moderate to severe aortic insufficiency.    [name], MD [facility] [phone]

## 2022-12-02 ENCOUNTER — Encounter: Payer: Self-pay | Admitting: *Deleted

## 2022-12-02 ENCOUNTER — Other Ambulatory Visit: Payer: Self-pay | Admitting: *Deleted

## 2022-12-02 DIAGNOSIS — I351 Nonrheumatic aortic (valve) insufficiency: Secondary | ICD-10-CM

## 2022-12-03 ENCOUNTER — Telehealth: Payer: Self-pay | Admitting: Gastroenterology

## 2022-12-03 MED ORDER — COLESEVELAM HCL 625 MG PO TABS: 625.0000 mg | ORAL_TABLET | ORAL | 1 refills | Status: AC

## 2022-12-03 NOTE — Telephone Encounter (Signed)
Prescription sent to patient's pharmacy per patients request. Informed patient on prescription to schedule an appt for future refills.

## 2022-12-03 NOTE — Telephone Encounter (Signed)
PT is calling to have a refill for colesevelam sent to [facility] on [address]. Please advise.

## 2022-12-11 NOTE — Pre-Procedure Instructions (Signed)
Alexis Wheeler  12/11/2022       Surgical Instructions  Your procedure is scheduled on Tuesday, December 3rd. Report to [facility] Main Entrance "A" at 5:30 AM., then check in with the Admitting office. Any questions or running late day of surgery: call [phone]  Questions prior to your surgery date: call [phone], Monday-Friday, 8am-4pm. If you experience any cold or flu symptoms such as cough, fever, chills, shortness of breath, etc. between now and your scheduled surgery, please notify us at the above number.     Remember:  Do not eat or drink after midnight the night before your surgery  Take these medicines the morning of surgery with A SIP OF WATER: amLODipine (NORVASC)  carvedilol (COREG)   May take these medicines IF NEEDED: loratadine (CLARITIN)  acetaminophen (TYLENOL)   STOP taking  Aleve, Naproxen, Ibuprofen, Motrin, Advil, Goody's, BC's, all herbal medications, fish oil, and non-prescription vitamins.  Continue Aspirin until the morning of surgery, do not take it that morning.  Follow your surgeon's instruction regarding Eliquis            Do NOT Smoke (Tobacco/Vaping) for 24 hours prior to your procedure.  If you use a CPAP at night, you may bring your mask/headgear for your overnight stay.   You will be asked to remove any contacts, glasses, piercing's, hearing aid's, dentures/partials prior to surgery. Please bring cases for these items if needed.    Patients discharged the day of surgery will not be allowed to drive home, and someone needs to stay with them for 24 hours.  SURGICAL WAITING ROOM VISITATION Patients may have no more than 2 support people in the waiting area - these visitors may rotate.   Pre-op nurse will coordinate an appropriate time for 1 ADULT support person, who may not rotate, to accompany patient in pre-op.  Children under the age of 84 must have an adult with them who is not the patient and must remain in the main  waiting area with an adult.  If the patient needs to stay at the hospital during part of their recovery, the visitor guidelines for inpatient rooms apply.  Please refer to the [facility] website for the visitor guidelines for any additional information.   If you received a COVID test during your pre-op visit  it is requested that you wear a mask when out in public, stay away from anyone that may not be feeling well and notify your surgeon if you develop symptoms. If you have been in contact with anyone that has tested positive in the last 10 days please notify you surgeon.      Pre-operative CHG Bathing Instructions   You can play a key role in reducing the risk of infection after surgery. Your skin needs to be as free of germs as possible. You can reduce the number of germs on your skin by washing with CHG (chlorhexidine gluconate) soap before surgery. CHG is an antiseptic soap that kills germs and continues to kill germs even after washing.   DO NOT use if you have an allergy to chlorhexidine/CHG or antibacterial soaps. If your skin becomes reddened or irritated, stop using the CHG and notify one of our RNs at [phone].              TAKE A SHOWER THE NIGHT BEFORE SURGERY AND THE DAY OF SURGERY    Please keep in mind the following:  DO NOT shave, including legs and underarms, 48 hours  prior to surgery.   You may shave your face before/day of surgery.  Place clean sheets on your bed the night before surgery Use a clean washcloth (not used since being washed) for each shower. DO NOT sleep with pet's night before surgery.  CHG Shower Instructions:  Wash your face and private area with normal soap. If you choose to wash your hair, wash first with your normal shampoo.  After you use shampoo/soap, rinse your hair and body thoroughly to remove shampoo/soap residue.  Turn the water OFF and apply half the bottle of CHG soap to a CLEAN washcloth.  Apply CHG soap ONLY FROM YOUR NECK DOWN TO  YOUR TOES (washing for 3-5 minutes)  DO NOT use CHG soap on face, private areas, open wounds, or sores.  Pay special attention to the area where your surgery is being performed.  If you are having back surgery, having someone wash your back for you may be helpful. Wait 2 minutes after CHG soap is applied, then you may rinse off the CHG soap.  Pat dry with a clean towel  Put on clean pajamas    Additional instructions for the day of surgery: DO NOT APPLY any lotions, deodorants, cologne, or perfumes.   Do not wear jewelry or makeup Do not wear nail polish, gel polish, artificial nails, or any other type of covering on natural nails (fingers and toes) Do not bring valuables to the hospital. [facility] is not responsible for valuables/personal belongings. Put on clean/comfortable clothes.  Please brush your teeth.  Ask your nurse before applying any prescription medications to the skin.    Please review handouts you were given.

## 2022-12-11 NOTE — Progress Notes (Signed)
PCP - Dr. [name]  Cardiologist - Dr. [name]  EP-no  Endocrine-no  Pulm-no  Chest x-ray - 12/14/22  EKG - 12/14/22  Stress Test - 09/08/22  ECHO - 09/30/22  Cardiac Cath - 10/16/22  AICD-no PM-no LOOP-no  Nerve Stimulator-no  Dialysis-no  Sleep Study - yes 02/09/17 CPAP -   LABS-CBC, CMP, PT/PTT, T/S, A1C,UA/micro  ASA-continue until morning of surgery  ERAS-no  HA1C-12/14/22- 5.9 GLP-1-no Fasting Blood Sugar - na Checks Blood Sugar ___na__ times a day  Alexis Wheeler has a Omi pod Insulin POD.  I instructed patient to decrease Basal rate by 20% at Midnight. Patient also has a continuous CBG reader- Dexacom 7.  I instructed patient to check CBG when he wakes up and every 2 hours until she leaves to come to the hospital, if CBG is < 70 , use her Glucose nasal spray. I instructed patient to bring extra supplies.   Anesthesia-  Pt denies having chest pain, sob, or fever at this time. All instructions explained to the pt, with a verbal understanding of the material. Pt agrees to go over the instructions while at home for a better understanding. The opportunity to ask questions was provided.

## 2022-12-14 ENCOUNTER — Other Ambulatory Visit: Payer: Self-pay

## 2022-12-14 ENCOUNTER — Encounter (HOSPITAL_COMMUNITY)
Admission: RE | Admit: 2022-12-14 | Discharge: 2022-12-14 | Disposition: A | Discharge status: Home / Self Care | Payer: Medicare Other | Source: Ambulatory Visit | Attending: Surgery | Admitting: Surgery

## 2022-12-14 ENCOUNTER — Ambulatory Visit (HOSPITAL_COMMUNITY)
Admission: RE | Admit: 2022-12-14 | Discharge: 2022-12-14 | Disposition: A | Discharge status: Home / Self Care | Payer: Medicare Other | Source: Ambulatory Visit | Attending: Surgery | Admitting: Surgery

## 2022-12-14 ENCOUNTER — Encounter (HOSPITAL_COMMUNITY): Payer: Self-pay

## 2022-12-14 DIAGNOSIS — Z01818 Encounter for other preprocedural examination: Secondary | ICD-10-CM | POA: Insufficient documentation

## 2022-12-14 DIAGNOSIS — I351 Nonrheumatic aortic (valve) insufficiency: Secondary | ICD-10-CM | POA: Insufficient documentation

## 2022-12-14 HISTORY — DX: Prediabetes: R73.03

## 2022-12-14 HISTORY — DX: Heart failure, unspecified: I50.9

## 2022-12-14 LAB — COMPREHENSIVE METABOLIC PANEL
ALT: 16 U/L (ref 0–44)
AST: 16 U/L (ref 15–41)
Albumin: 3.6 g/dL (ref 3.5–5.0)
Alkaline Phosphatase: 81 U/L (ref 38–126)
Anion gap: 7 (ref 5–15)
BUN: 10 mg/dL (ref 8–23)
CO2: 28 mmol/L (ref 22–32)
Calcium: 9.1 mg/dL (ref 8.9–10.3)
Chloride: 107 mmol/L (ref 98–111)
Creatinine, Ser: 0.96 mg/dL (ref 0.44–1.00)
GFR, Estimated: >60 mL/min (ref >60)
Glucose, Bld: 91 mg/dL (ref 70–99)
Potassium: 3.5 mmol/L (ref 3.5–5.1)
Sodium: 142 mmol/L (ref 135–145)
Total Bilirubin: 0.8 mg/dL (ref <1.2)
Total Protein: 6.9 g/dL (ref 6.5–8.1)

## 2022-12-14 LAB — URINALYSIS, ROUTINE W REFLEX MICROSCOPIC
Bacteria, UA: NONE SEEN
Bilirubin Urine: NEGATIVE
Glucose, UA: NEGATIVE mg/dL
Hgb urine dipstick: NEGATIVE
Ketones, ur: NEGATIVE mg/dL
Nitrite: NEGATIVE
Protein, ur: NEGATIVE mg/dL
Specific Gravity, Urine: 1.014 (ref 1.005–1.030)
pH: 7 (ref 5.0–8.0)

## 2022-12-14 LAB — PROTIME-INR
INR: 1.1 (ref 0.8–1.2)
Prothrombin Time: 14.5 s (ref 11.4–15.2)

## 2022-12-14 LAB — CBC
HCT: 41.1 % (ref 36.0–46.0)
Hemoglobin: 13 g/dL (ref 12.0–15.0)
MCH: 23.2 pg — ABNORMAL LOW (ref 26.0–34.0)
MCHC: 31.6 g/dL (ref 30.0–36.0)
MCV: 73.3 fL — ABNORMAL LOW (ref 80.0–100.0)
Platelets: 204 10*3/uL (ref 150–400)
RBC: 5.61 MIL/uL — ABNORMAL HIGH (ref 3.87–5.11)
RDW: 15.9 % — ABNORMAL HIGH (ref 11.5–15.5)
WBC: 6.4 10*3/uL (ref 4.0–10.5)
nRBC: 0 % (ref 0.0–0.2)

## 2022-12-14 LAB — HEMOGLOBIN A1C
Hgb A1c MFr Bld: 5.9 % — ABNORMAL HIGH (ref 4.8–5.6)
Mean Plasma Glucose: 122.63 mg/dL

## 2022-12-14 LAB — SURGICAL PCR SCREEN
MRSA, PCR: NEGATIVE
Staphylococcus aureus: NEGATIVE

## 2022-12-14 LAB — APTT: aPTT: 33 s (ref 24–36)

## 2022-12-14 LAB — SARS CORONAVIRUS 2 (TAT 6-24 HRS): SARS Coronavirus 2: NEGATIVE

## 2022-12-14 MED ORDER — NOREPINEPHRINE 4 MG/250ML-% IV SOLN
0.0000 ug/min | INTRAVENOUS | Status: AC
  Administered 2022-12-15: 2 ug/min via INTRAVENOUS
  Filled 2022-12-14: qty 250

## 2022-12-14 MED ORDER — VANCOMYCIN HCL 1500 MG/300ML IV SOLN
1500.0000 mg | INTRAVENOUS | Status: AC
Start: 2022-12-15 — End: 2022-12-16
  Administered 2022-12-15: 1500 mg via INTRAVENOUS
  Filled 2022-12-14: qty 300

## 2022-12-14 MED ORDER — DEXMEDETOMIDINE HCL IN NACL 400 MCG/100ML IV SOLN
0.1000 ug/kg/h | INTRAVENOUS | Status: AC
  Administered 2022-12-15: .3 ug/kg/h via INTRAVENOUS
  Filled 2022-12-14: qty 100

## 2022-12-14 MED ORDER — EPINEPHRINE HCL 5 MG/250ML IV SOLN IN NS
0.0000 ug/min | INTRAVENOUS | Status: DC
  Filled 2022-12-14: qty 250

## 2022-12-14 MED ORDER — NITROGLYCERIN IN D5W 200-5 MCG/ML-% IV SOLN
2.0000 ug/min | INTRAVENOUS | Status: DC
  Filled 2022-12-14: qty 250

## 2022-12-14 MED ORDER — MANNITOL 20 % IV SOLN
INTRAVENOUS | Status: DC
  Filled 2022-12-14: qty 13

## 2022-12-14 MED ORDER — PLASMA-LYTE A IV SOLN
INTRAVENOUS | Status: DC
  Filled 2022-12-14: qty 2.5

## 2022-12-14 MED ORDER — TRANEXAMIC ACID 1000 MG/10ML IV SOLN
1.5000 mg/kg/h | INTRAVENOUS | Status: AC
  Administered 2022-12-15: 1.5 mg/kg/h via INTRAVENOUS
  Filled 2022-12-14: qty 25

## 2022-12-14 MED ORDER — TRANEXAMIC ACID (OHS) BOLUS VIA INFUSION
15.0000 mg/kg | INTRAVENOUS | Status: AC
  Administered 2022-12-15: 1353 mg via INTRAVENOUS
  Filled 2022-12-14: qty 1353

## 2022-12-14 MED ORDER — HEPARIN 30,000 UNITS/1000 ML (OHS) CELLSAVER SOLUTION
Status: DC
  Filled 2022-12-14: qty 1000

## 2022-12-14 MED ORDER — CEFAZOLIN SODIUM-DEXTROSE 2-4 GM/100ML-% IV SOLN
2.0000 g | INTRAVENOUS | Status: DC
  Filled 2022-12-14: qty 100

## 2022-12-14 MED ORDER — MILRINONE LACTATE IN DEXTROSE 20-5 MG/100ML-% IV SOLN
0.3000 ug/kg/min | INTRAVENOUS | Status: DC
  Filled 2022-12-14: qty 100

## 2022-12-14 MED ORDER — CEFAZOLIN SODIUM-DEXTROSE 2-4 GM/100ML-% IV SOLN
2.0000 g | INTRAVENOUS | Status: AC
  Administered 2022-12-15 (×2): 2 g via INTRAVENOUS
  Filled 2022-12-14: qty 100

## 2022-12-14 MED ORDER — PHENYLEPHRINE HCL-NACL 20-0.9 MG/250ML-% IV SOLN
30.0000 ug/min | INTRAVENOUS | Status: DC
  Filled 2022-12-14: qty 250

## 2022-12-14 MED ORDER — INSULIN REGULAR(HUMAN) IN NACL 100-0.9 UT/100ML-% IV SOLN
INTRAVENOUS | Status: AC
  Administered 2022-12-15: 1 [IU]/h via INTRAVENOUS
  Filled 2022-12-14: qty 100

## 2022-12-14 MED ORDER — TRANEXAMIC ACID (OHS) PUMP PRIME SOLUTION
2.0000 mg/kg | INTRAVENOUS | Status: DC
  Filled 2022-12-14: qty 1.8

## 2022-12-14 MED ORDER — POTASSIUM CHLORIDE 2 MEQ/ML IV SOLN
80.0000 meq | INTRAVENOUS | Status: DC
  Filled 2022-12-14: qty 40

## 2022-12-14 NOTE — Progress Notes (Signed)
VASCULAR LAB    Carotid duplex has been performed.  See CV proc for preliminary results.   Raneen Jaffer, RVT 12/14/2022, 10:54 AM

## 2022-12-14 NOTE — Anesthesia Preprocedure Evaluation (Addendum)
Anesthesia Evaluation  Patient identified by MRN, date of birth, ID band Patient awake    Reviewed: Allergy & Precautions, NPO status , Patient's Chart, lab work & pertinent test results  Airway Mallampati: II       Dental  (+) Poor Dentition, Missing   Pulmonary sleep apnea    Pulmonary exam normal        Cardiovascular hypertension, Pt. on medications and Pt. on home beta blockers + CAD, + Past MI and +CHF  + Valvular Problems/Murmurs AI  Rhythm:Regular Rate:Normal  ECHO:   1. Left ventricular ejection fraction, by estimation, is 55 to 60%. The  left ventricle has normal function. The left ventricular internal cavity  size was mildly dilated. There is mild left ventricular hypertrophy.   2. Right ventricular systolic function is normal. The right ventricular  size is normal.   3. Left atrial size was mildly dilated. No left atrial/left atrial  appendage thrombus was detected.   4. The mitral valve is normal in structure. Mild mitral valve  regurgitation.   5. 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 The aortic valve is tricuspid. Aortic valve  regurgitation is moderate to severe.   6. 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 No  communications to true lumen seen by color flow . Aortic dilatation noted.  There is severe dilatation of the  aortic root, measuring 48 mm.    RHC/LHC 10/16/2022: LM: Minimal luminal irregularities LAD: No significant disease Lcx: No significant disease RCA: No significant disease   RA: 0  mmHg RV: 23/1 mmHg PA: 22/9 mmHg, mPAP 16 mmHg PCW: 4 mmHg   CO: 6.2 L/min CI: 3.2 L/min/m2\   No significant coronary artery disease Normal filling pressures    Neuro/Psych negative neurological ROS  negative psych ROS   GI/Hepatic Neg liver ROS,GERD  ,,  Endo/Other  negative endocrine ROS    Renal/GU      Musculoskeletal   Abdominal Normal abdominal exam  (+)   Peds  Hematology  (+) Blood dyscrasia, anemia Lab Results      Component                Value               Date                      WBC                      6.4                 12/14/2022                HGB                      13.0                12/14/2022                HCT  41.1                12/14/2022                MCV                      73.3 (L)            12/14/2022                PLT                      204                 12/14/2022             Lab Results      Component                Value               Date                      NA                       142                 12/14/2022                K                        3.5                 12/14/2022                CO2                      28                  12/14/2022                GLUCOSE                  91                  12/14/2022                BUN                      10                  12/14/2022                CREATININE               0.96                12/14/2022                CALCIUM                  9.1                 12/14/2022                GFR                      70.32  08/02/2014                EGFR                     59 (L)              10/23/2022                GFRNONAA                 >60                 12/14/2022              Anesthesia Other Findings   Reproductive/Obstetrics                             Anesthesia Physical Anesthesia Plan  ASA: 4  Anesthesia Plan: General   Post-op Pain Management:    Induction: Intravenous  PONV  Risk Score and Plan: 3 and Ondansetron, Midazolam and Treatment may vary due to age or medical condition  Airway Management Planned: Mask and Oral ETT  Additional Equipment: Arterial line, CVP, PA Cath, TEE, 3D TEE and Ultrasound Guidance Line Placement  Intra-op Plan:   Post-operative Plan: Post-operative intubation/ventilation  Informed Consent: I have reviewed the patients History and Physical, chart, labs and discussed the procedure including the risks, benefits and alternatives for the proposed anesthesia with the patient or authorized representative who has indicated his/her understanding and acceptance.     Dental advisory given  Plan Discussed with: CRNA  Anesthesia Plan Comments:        Anesthesia Quick Evaluation

## 2022-12-14 NOTE — H&P (Signed)
[address]       [place] [address]             [phone]      Cardiothoracic Surgery Admission History and Physical   PCP is [name], MD Referring Provider is Quintella Reichert, MD       Chief Complaint  Patient presents with   Aortic Insufficiency and aortic root aneurysm.            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 Her postop echo in July 2016 showed only trivial AI with an aortic root diameter of 42 mm and an EF of 55-60%. AI was mild in 03/2018 and 03/2019. AI was moderate in 03/2020 and 07/2020 with a pressure half time of 359 msec. Her most recent echo on 09/08/2022 showed eccentric moderate to severe AI with a PHT of 354 msec and a vena contracta of 1 cm but no diastolic flow reversal in the descending aorta. LV diastolic diameter was 4.85 cm. Aortic root diameter was measured at 4.5 cm.   She presented to the ER 08/13/2022 with chest heaviness. ECG and troponin were negative. Her pain was resolved with NSAIDs. She saw Dr. [name] and had an ETT that showed no ST changes but was inadequate due to poor exercise capacity. A TEE showed a dilated aortic root to 4.8 cm with lack of central coaptation of the aortic valve leaflets and moderate to severe AI with a VC of 0.49 cm2, regurgitant volume of 50 cc, AI ERO of 0.33 cm2 and AI PISA radius of 0.6 cm. LV and RV systolic function were normal. Cath showed no significant coronary artery disease with normal PA pressures and CI 3.2.   She reports some shortness of breath and fatigue with exertion. This occurs with  walking up stairs or inclines and cleaning her house. She has not had any further chest discomfort, no dizziness, no LE swelling.            Past Medical History:  Diagnosis Date   Anemia      childhood   Aortic insufficiency      Moderate to severe by echo 08/2022   Arthritis      right knee   Ascending aortic dissection [facility])      s/p repair by Dr. [name].  2D echo 07/2020 with aortic root 41mm   Blood transfusion without reported diagnosis      with heart surgery-repair aorta   CAD (coronary artery disease)      a. 04/2015: cath showed 30% Ost RCA stenosis, 20% mid-LAD stenosis, and normal LV function.    Cataract      bilateral eyes, per pt.   Congestive dilated cardiomyopathy (HCC) 08/01/2014   DVT (deep venous thrombosis) (HCC)      right   GERD     HEMATURIA UNSPECIFIED     History of kidney stones     HYPERLIPIDEMIA     HYPERTENSION     Morbid obesity (HCC)     Myocardial infarction (HCC)      01/30/2014  MYOSITIS     OSA on CPAP 05/10/2017    mild OSA with an AHI of 5.2/hr overall but severe during REM sleep with an AHI of 41.4/hr and underwent CPAP titration to 12cm H2O   Pulmonary embolism with acute cor pulmonale (HCC) 03/2020    Saddle embolism with acute cor pulmonale   Sleep apnea      cpap-  doesnt use it- last study years ago   Vertigo                 Past Surgical History:  Procedure Laterality Date   ABDOMINAL HYSTERECTOMY   1983   CARDIAC CATHETERIZATION N/A 04/30/2015    Procedure: Left Heart Cath and Coronary Angiography;  Surgeon: Peter M Swaziland, MD;  Location: [facility] INVASIVE CV LAB;  Service: Cardiovascular;  Laterality: N/A;   CHOLECYSTECTOMY   2001   CYSTOSCOPY W/ RETROGRADES Left 08/02/2015    Procedure: CYSTOSCOPY WITH LEFT RETROGRADE PYELOGRAM;  Surgeon: Jerilee Field, MD;  Location: [facility] ORS;  Service: Urology;  Laterality: Left;   CYSTOSCOPY WITH URETEROSCOPY, STONE BASKETRY AND STENT PLACEMENT Left 08/02/2015    Procedure:  Geoffry Paradise ;  Surgeon: Jerilee Field, MD;  Location: [facility] ORS;  Service: Urology;  Laterality: Left;   CYSTOSCOPY/URETEROSCOPY/HOLMIUM LASER/STENT PLACEMENT Left 08/02/2015    Procedure: CYSTOSCOPY/URETEROSCOPY/HOLMIUM LASER/STENT PLACEMENT-LEFT;  Surgeon: Jerilee Field, MD;  Location: [facility] ORS;  Service: Urology;  Laterality: Left;   HOLMIUM LASER APPLICATION Left 08/02/2015    Procedure: HOLMIUM LASER APPLICATION;  Surgeon: Jerilee Field, MD;  Location: [facility] ORS;  Service: Urology;  Laterality: Left;   REPLACEMENT ASCENDING AORTA N/A 03/03/2014    Procedure: REPAIR OF ASCENDING AORTIC DISSECTION;  Surgeon: Delight Ovens, MD;  Location: [facility] OR;  Service: Open Heart Surgery;  Laterality: N/A;   RIGHT/LEFT HEART CATH AND CORONARY ANGIOGRAPHY N/A 10/16/2022    Procedure: RIGHT/LEFT HEART CATH AND CORONARY ANGIOGRAPHY;  Surgeon: Elder Negus, MD;  Location: [facility] INVASIVE CV LAB;  Service: Cardiovascular;  Laterality: N/A;   TEE WITHOUT CARDIOVERSION N/A 09/30/2022    Procedure: TRANSESOPHAGEAL ECHOCARDIOGRAM;  Surgeon: Wendall Stade, MD;  Location: [facility] INVASIVE CV LAB;  Service: Cardiovascular;  Laterality: N/A;               Family History  Problem Relation Age of Onset   Breast cancer Mother 42   Aortic dissection Mother 31   Hypertension Mother     Hypertension Father     Breast cancer Sister     Hypertension Sister     Throat cancer Brother          1 bro living   Colon cancer Brother     Esophageal cancer Brother     Breast cancer Daughter     Hypertension Daughter     Liver cancer Daughter     Colon cancer Daughter     Breast cancer Niece     Seizures Neg Hx     Pancreatic cancer Neg Hx     Rectal cancer Neg Hx     Stomach cancer Neg Hx            Social History Social History  Social History         Tobacco Use   Smoking status: Never   Smokeless tobacco: Never   Tobacco comments:      separated spring 2013- lives with 1 dtr -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 30 tablet 0   amLODipine (NORVASC) 10 MG tablet Take 1 tablet (10 mg total) by mouth daily. 90 tablet 3   apixaban (ELIQUIS) 5 MG TABS tablet Take 1 tablet by mouth 2 (two) times daily.       aspirin EC 81 MG tablet Take 81 mg by mouth daily. Swallow whole.       atorvastatin (LIPITOR) 40 MG tablet TAKE 1 TABLET BY MOUTH ONCE DAILY AT 6:00 IN THE  EVENING 90 tablet 3   carvedilol (COREG) 12.5 MG tablet Take 1 tablet (12.5 mg total) by mouth 2 (two) times daily. 180 tablet 3   colesevelam (WELCHOL) 625 MG tablet Take 1 tablet (625 mg total) by mouth every other day. As needed (Patient taking differently: Take 625 mg by mouth daily as needed (diarrhea).) 30 tablet 0   diclofenac Sodium (VOLTAREN) 1 % GEL Apply 2-4 g topically 4 (four) times daily as needed (pain).       folic acid (FOLVITE) 1 MG tablet Take 1 mg by mouth daily.       loratadine (CLARITIN) 10 MG tablet Take 1 tablet (10 mg total) by mouth daily. (Patient taking differently: Take 10 mg by mouth daily as needed for allergies.) 30 tablet 0   losartan (COZAAR) 100 MG tablet Take 100 mg by mouth daily.       potassium chloride SA (KLOR-CON M) 20 MEQ tablet Take 2 tablets (40 mEq total) by mouth once for 1 dose. (Patient taking differently: Take 20 mEq by mouth every other day.) 2 tablet 0      No current facility-administered medications for this visit.        Allergies       Allergies  Allergen Reactions   Hydralazine Nausea Only   Motrin [Ibuprofen] Nausea Only      Upset GI (motrin brand causes the side effect)        Review of Systems  Constitutional:  Positive for fatigue.   HENT: Negative.    Eyes: Negative.   Respiratory:  Positive for shortness of breath.   Cardiovascular:  Negative for chest pain and leg swelling.  Gastrointestinal: Negative.   Endocrine: Negative.   Genitourinary: Negative.   Musculoskeletal:  Positive for arthralgias.  Skin: Negative.   Allergic/Immunologic: Negative.   Neurological:  Negative for dizziness and syncope.  Hematological: Negative.   Psychiatric/Behavioral: Negative.        BP (!) 168/76 (BP Location: Right Arm, Patient Position: Sitting)   Pulse 60   Resp 18   Ht 5\' 3"  (1.6 m)   Wt 200 lb (90.7 kg)   SpO2 95% Comment: RA  BMI 35.43 kg/m  Physical Exam Constitutional:      Appearance: She is obese.  HENT:     Head: Normocephalic and atraumatic.  Eyes:     Extraocular Movements: Extraocular movements intact.     Conjunctiva/sclera: Conjunctivae normal.     Pupils: Pupils are equal, round, and reactive to light.  Neck:     Vascular: No carotid bruit.  Cardiovascular:     Rate and Rhythm: Normal rate and  regular rhythm.     Pulses: Normal pulses.     Heart sounds: Murmur heard.     Comments: 2/6 diastolic murmur LLSB Pulmonary:     Effort: Pulmonary effort is normal.     Breath sounds: Normal breath sounds.  Abdominal:     General: There is no distension.     Tenderness: There is no abdominal tenderness.  Musculoskeletal:        General: No swelling or tenderness.     Cervical back: Normal range of motion and neck supple.  Skin:    General: Skin is warm and dry.  Neurological:     General: No focal deficit present.     Mental Status: She is alert and oriented to person, place, and time.  Psychiatric:        Mood and Affect: Mood normal.        Behavior: Behavior normal.          Diagnostic Tests:     TRANSESOPHOGEAL ECHO REPORT       Patient Name:   GENELLA SCHOUTEN Date of Exam: 09/30/2022  Medical Rec #:  [id number]           Height:  Accession #:    [id number]           Weight:   Date of Birth:  November 04, 1955            BSA:  Patient Age:    67 years            BP:           172/89 mmHg  Patient Gender: F                   HR:           68 bpm.  Exam Location:  Inpatient   Procedure: Transesophageal Echo, 3D Echo, Cardiac Doppler and Color  Doppler   Indications:    Aortic Valve Abnormality    History:         Patient has prior history of Echocardiogram examinations,  most                  recent 09/08/2022.    Sonographer:     Darlys Gales  Referring Phys:  Charlton Haws MD  Diagnosing Phys: Charlton Haws MD   PROCEDURE: After discussion of the risks and benefits of a TEE, an  informed consent was obtained from the patient. The transesophogeal probe  was passed without difficulty through the esophogus of the patient.  Sedation performed by different physician.  The patient was monitored while under deep sedation. Anesthestetic  sedation was provided intravenously by Anesthesiology: 315mg  of Propofol,  20mg  of Lidocaine. The patient developed no complications during the  procedure.    IMPRESSIONS     1. Left ventricular ejection fraction, by estimation, is 55 to 60%. The  left ventricle has normal function. The left ventricular internal cavity  size was mildly dilated. There is mild left ventricular hypertrophy.   2. Right ventricular systolic function is normal. The right ventricular  size is normal.   3. Left atrial size was mildly dilated. No left atrial/left atrial  appendage thrombus was detected.   4. The mitral valve is normal in structure. Mild mitral valve  regurgitation.   5. Moderate to severe eccentric AR. Mechanism is prior dissection with AV  resuspension Persistent severely dilated sinuses especially left sinus  with central failure to co apt Also concern for  perforation of left sinus  AR PISA radius 0.6 cm AR jet width   in LVOT 0.6 cm 3D Vena contracta width 0.49 cm2 AR ERO 0.33 cm2 and AR  regurgitant volume 50 cc . The aortic valve is  tricuspid. Aortic valve  regurgitation is moderate to severe.   6. 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 No  communications to true lumen seen by color flow . Aortic dilatation noted.  There is severe dilatation of the  aortic root, measuring 48 mm.   FINDINGS   Left Ventricle: Left ventricular ejection fraction, by estimation, is 55  to 60%. The left ventricle has normal function. The left ventricular  internal cavity size was mildly dilated. There is mild left ventricular  hypertrophy.   Right Ventricle: The right ventricular size is normal. Right vetricular  wall thickness was not assessed. Right ventricular systolic function is  normal.   Left Atrium: Left atrial size was mildly dilated. No left atrial/left  atrial appendage thrombus was detected.   Right Atrium: Right atrial size was normal in size.   Pericardium: There is no evidence of pericardial effusion.   Mitral Valve: The mitral valve is normal in structure. Mild mitral valve  regurgitation.   Tricuspid Valve: The tricuspid valve is normal in structure. Tricuspid  valve regurgitation is mild.   Aortic Valve: Moderate to severe eccentric AR. Mechanism is prior  dissection with AV resuspension Persistent severely dilated sinuses  especially left sinus with central failure to co apt Also concern for  perforation of left sinus AR PISA radius 0.6 cm AR   jet width in LVOT 0.6 cm 3D Vena contracta width 0.49 cm2 AR ERO 0.33 cm2  and AR regurgitant volume 50 cc. The aortic valve is tricuspid. Aortic  valve regurgitation is moderate to severe.   Pulmonic Valve: The pulmonic valve was normal in structure. Pulmonic valve  regurgitation is trivial.   Aorta: 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 No  communications to true lumen seen  by color flow. Aortic dilatation noted.  There is severe dilatation of the  aortic root, measuring 48 mm.   IAS/Shunts: No atrial level shunt detected by color flow Doppler.   Charlton Haws MD  Electronically signed by Charlton Haws MD  Signature Date/Time: 09/30/2022/3:53:32 PM        Final      Physicians   Panel Physicians Referring Physician Case Authorizing Physician  Elder Negus, MD (Primary)        Procedures   RIGHT/LEFT HEART CATH AND CORONARY ANGIOGRAPHY    Conclusion   RHC/LHC 10/16/2022: LM: Minimal luminal irregularities LAD: No significant disease Lcx: No significant disease RCA: No significant disease   RA: 0 mmHg RV: 23/1 mmHg PA: 22/9 mmHg, mPAP 16 mmHg PCW: 4 mmHg   CO: 6.2 L/min CI: 3.2 L/min/m2\   No significant coronary artery disease Normal filling pressures   Manish Emiliano Dyer, MD     Recommendations   Antiplatelet/Anticoag No clear indication for Aspirin for primary prevention from coronary standpoint    Surgeon Notes       09/30/2022 12:04 PM CV Procedure signed by Wendall Stade, MD    Clinical Presentation   CHF/Shock Congestive heart failure not present. No shock present.    Procedural Details   Technical Details Procedures: 1. Right  heart catheterization 2. Left heart catheterization 3. Selective left and right coronary angiography 4. Conscious sedation monitoring 20 min  Indication: Aortic regurgitation  History: 32 African-American female with hypertension, hyperlipidemia, h/o dilated cardiomyopathy with recovered LVEF, h/o ascending aorta dissection s/p repair with 30 mm Hemashield platinum graft, h/o DVT/PE, OSA on CPAP, now with moderate to severe leg regurgitation.  Patient was recommended return after catheterization for possible preop workup.   Diagnostic Angiography: Catheter/s advances over guidewire under fluoroscopy Left coronary artery: 5 Fr JL 5  Right coronary artery: 5 Fr JR 4 Left heart  catheterization: 5 Fr TIG   Pressures tracings obtained in right atrium, right ventricle, pulmonary artery, and pulmonary capillary wedge position. Invasive hemodynamic measurements performed using Fick method.     Anticoagulation:  4500 units heparin  Hemostasis: TR band  Total contrast used: 45 cc   Total fluoro time: 4.7 min Air Kerma: 84 mGy  Conscious sedation was administered under my direct supervision. IV Versed 1 mg and fentanyl 25 mcg administered. Continuous ECG, pulse oximetry and blood pressure were monitored throughout the entire procedure. Monitoring corroborated by cath lab nurse and technician.  Total sedation time: 20 minutes.  All wires and catheters removed out of the body at the end of the procedure Final angiogram showed no dissection/perforation.   [image] Elder Negus, MD Pager: [phone] Office: [phone]          Estimated blood loss <50 mL.   During this procedure medications were administered to achieve and maintain moderate conscious sedation while the patient's heart rate, blood pressure, and oxygen saturation were continuously monitored and I was present face-to-face 100% of this time.    Medications (Filter: Administrations occurring from 0815 to 0910 on 10/16/22) lidocaine (PF) (XYLOCAINE) 1 % injection (mL)  Total volume: 4 mL Date/Time Rate/Dose/Volume Action    10/16/22 0839 2 mL Given    0840 2 mL Given    fentaNYL (SUBLIMAZE) injection (mcg)  Total dose: 25 mcg Date/Time Rate/Dose/Volume Action    10/16/22 0839 25 mcg Given    midazolam (VERSED) injection (mg)  Total dose: 1 mg Date/Time Rate/Dose/Volume Action    10/16/22 0837 1 mg Given    Heparin (Porcine) in NaCl 1000-0.9 UT/500ML-% SOLN (mL)  Total volume: 1,000 mL Date/Time Rate/Dose/Volume Action    10/16/22 0840 500 mL Given    0900 500 mL Given    iohexol (OMNIPAQUE) 350 MG/ML injection (mL)  Total volume: 45 mL Date/Time Rate/Dose/Volume  Action    10/16/22 0904 45 mL Given    heparin sodium (porcine) injection (Units)  Total dose: 4,500 Units Date/Time Rate/Dose/Volume Action    10/16/22 0848 4,500 Units Given    Radial Cocktail/Verapamil only (mL)  Total volume: 10 mL Date/Time Rate/Dose/Volume Action    10/16/22 0841 10 mL Given      Sedation Time   Sedation Time Physician-1: 20 minutes 16 seconds Contrast        Administrations occurring from 0815 to 0910 on 10/16/22:  Medication Name Total Dose  iohexol (OMNIPAQUE) 350 MG/ML injection 45 mL    Radiation/Fluoro   Fluoro time: 4.7 (min) DAP: 6.9 (Gycm2) Cumulative Air Kerma: 84 (mGy) Complications   Complications documented before study signed (10/16/2022  9:14 AM)    No complications were associated with this study.  Documented by Loma Messing B - 10/16/2022  9:01 AM      Coronary Findings   Diagnostic Dominance: Right Left Main  Vessel is large. The vessel exhibits minimal  luminal irregularities.    Left Anterior Descending  Vessel is large. The vessel exhibits minimal luminal irregularities. The vessel is ectatic.    Left Circumflex  Vessel is large. Vessel is angiographically normal.    Right Coronary Artery  Vessel is large. The vessel exhibits minimal luminal irregularities.    Intervention    No interventions have been documented.    Right Heart   Right Heart Pressures RA: 0 mmHg RV: 23/1 mmHg PA: 22/9 mmHg, mPAP 16 mmHg PCW: 4 mmHg  CO: 6.2 L/min CI: 3.2 L/min/m2    Coronary Diagrams   Diagnostic Dominance: Right  Intervention    Implants    No implant documentation for this case.    Syngo Images    Show images for CARDIAC CATHETERIZATION Images on Long Term Storage    Show images for Yurika, Hollon to Procedure Log   Procedure Log    Link to Procedure Log   Procedure Log    Hemo Data   Flowsheet Row Most Recent Value  Fick Cardiac Output 6.21 L/min  Fick Cardiac Output Index 3.22  (L/min)/BSA  Aortic Mean Gradient 8.8 mmHg  Aortic Peak Gradient 9.8 mmHg  Aortic Valve Area 2.45  Aortic Value Area Index 1.27 cm2/BSA  RA A Wave 4 mmHg  RA V Wave 5 mmHg  RA Mean -1 mmHg  RV Systolic Pressure 23 mmHg  RV Diastolic Pressure 1 mmHg  RV EDP 5 mmHg  PA Systolic Pressure 22 mmHg  PA Diastolic Pressure 9 mmHg  PA Mean 16 mmHg  PW A Wave 11 mmHg  PW V Wave 9 mmHg  PW Mean 4 mmHg  AO Systolic Pressure 150 mmHg  AO Diastolic Pressure 53 mmHg  AO Mean 93 mmHg  LV Systolic Pressure 147 mmHg  LV Diastolic Pressure 0 mmHg  LV EDP 18 mmHg  AOp Systolic Pressure 148 mmHg  AOp Diastolic Pressure 51 mmHg  AOp Mean Pressure 86 mmHg  LVp Systolic Pressure 148 mmHg  LVp Diastolic Pressure 3 mmHg  LVp EDP Pressure 18 mmHg  QP/QS 1  TPVR Index 4.97 HRUI  TSVR Index 28.87 HRUI  TPVR/TSVR Ratio 0.17      Impression:   This 67 year old woman has developed moderate to severe AI after aortic dissection repair with resuspension of the aortic valve in 2016.  She has had progressive aortic root dilatation to 5.1 cm with worsening aortic insufficiency which is now moderate to severe.  She has NYHA class II symptoms of exertional fatigue and shortness of breath.  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  It extends up into the innominate artery.  The left renal artery arises from the false lumen and is significantly hypoperfused.  The accessory left renal artery is smaller and supplies the lower pole.  The remainder of the abdominal vessels come off of the true lumen.  The proximal descending thoracic aorta measures 4 cm.  The proximal abdominal aorta measures 3.1 cm.  I think the best option is to proceed with redo sternotomy for aortic root replacement using a bioprosthetic valve graft.  She is becoming symptomatic and I would expect her aortic insufficiency to continue to worsen.  Her operative risk is increased  due to her previous aortic dissection, prior surgery, and morbid obesity.  I reviewed the CTA images with her and her friend and answered her questions.  I discussed the operative procedure with the patient  including alternatives, benefits and risks; including but not limited to bleeding, blood transfusion, infection, stroke, myocardial infarction, graft failure, heart block requiring a permanent pacemaker, organ dysfunction, and death.  [name] understands and agrees to proceed.     Plan:   Redo sternotomy for Bentall procedure using a bioprosthetic valve conduit.   [name], MD [facility] [phone]

## 2022-12-15 ENCOUNTER — Inpatient Hospital Stay (HOSPITAL_COMMUNITY)
Admission: RE | Admit: 2022-12-15 | Discharge: 2022-12-22 | DRG: 220 | Disposition: A | Discharge status: Home Health | Payer: Medicare Other | Attending: Surgery | Admitting: Surgery

## 2022-12-15 ENCOUNTER — Other Ambulatory Visit: Payer: Self-pay

## 2022-12-15 ENCOUNTER — Inpatient Hospital Stay (HOSPITAL_COMMUNITY): Payer: Medicare Other

## 2022-12-15 ENCOUNTER — Encounter (HOSPITAL_COMMUNITY): Payer: Self-pay | Admitting: Surgery

## 2022-12-15 ENCOUNTER — Encounter (HOSPITAL_COMMUNITY)
Admission: RE | Disposition: A | Discharge status: Home Health | Payer: Self-pay | Source: Home / Self Care | Attending: Surgery

## 2022-12-15 ENCOUNTER — Inpatient Hospital Stay (HOSPITAL_COMMUNITY): Payer: Medicare Other | Admitting: Anesthesiology

## 2022-12-15 DIAGNOSIS — I420 Dilated cardiomyopathy: Secondary | ICD-10-CM | POA: Diagnosis present

## 2022-12-15 DIAGNOSIS — I2510 Atherosclerotic heart disease of native coronary artery without angina pectoris: Secondary | ICD-10-CM | POA: Diagnosis present

## 2022-12-15 DIAGNOSIS — Z8249 Family history of ischemic heart disease and other diseases of the circulatory system: Secondary | ICD-10-CM

## 2022-12-15 DIAGNOSIS — I252 Old myocardial infarction: Secondary | ICD-10-CM

## 2022-12-15 DIAGNOSIS — E876 Hypokalemia: Secondary | ICD-10-CM | POA: Diagnosis not present

## 2022-12-15 DIAGNOSIS — Z87442 Personal history of urinary calculi: Secondary | ICD-10-CM

## 2022-12-15 DIAGNOSIS — I77810 Thoracic aortic ectasia: Secondary | ICD-10-CM | POA: Diagnosis present

## 2022-12-15 DIAGNOSIS — Z79899 Other long term (current) drug therapy: Secondary | ICD-10-CM

## 2022-12-15 DIAGNOSIS — I10 Essential (primary) hypertension: Secondary | ICD-10-CM | POA: Diagnosis present

## 2022-12-15 DIAGNOSIS — Z800 Family history of malignant neoplasm of digestive organs: Secondary | ICD-10-CM | POA: Diagnosis not present

## 2022-12-15 DIAGNOSIS — Z6837 Body mass index (BMI) 37.0-37.9, adult: Secondary | ICD-10-CM | POA: Diagnosis not present

## 2022-12-15 DIAGNOSIS — E785 Hyperlipidemia, unspecified: Secondary | ICD-10-CM | POA: Diagnosis present

## 2022-12-15 DIAGNOSIS — I061 Rheumatic aortic insufficiency: Secondary | ICD-10-CM | POA: Diagnosis present

## 2022-12-15 DIAGNOSIS — I351 Nonrheumatic aortic (valve) insufficiency: Secondary | ICD-10-CM | POA: Diagnosis not present

## 2022-12-15 DIAGNOSIS — Z86711 Personal history of pulmonary embolism: Secondary | ICD-10-CM

## 2022-12-15 DIAGNOSIS — Z90710 Acquired absence of both cervix and uterus: Secondary | ICD-10-CM

## 2022-12-15 DIAGNOSIS — D689 Coagulation defect, unspecified: Secondary | ICD-10-CM | POA: Diagnosis not present

## 2022-12-15 DIAGNOSIS — D696 Thrombocytopenia, unspecified: Secondary | ICD-10-CM | POA: Diagnosis not present

## 2022-12-15 DIAGNOSIS — Z9049 Acquired absence of other specified parts of digestive tract: Secondary | ICD-10-CM

## 2022-12-15 DIAGNOSIS — Z01818 Encounter for other preprocedural examination: Principal | ICD-10-CM

## 2022-12-15 DIAGNOSIS — Z7982 Long term (current) use of aspirin: Secondary | ICD-10-CM | POA: Diagnosis not present

## 2022-12-15 DIAGNOSIS — Z7901 Long term (current) use of anticoagulants: Secondary | ICD-10-CM

## 2022-12-15 DIAGNOSIS — Z86718 Personal history of other venous thrombosis and embolism: Secondary | ICD-10-CM | POA: Diagnosis not present

## 2022-12-15 DIAGNOSIS — Z808 Family history of malignant neoplasm of other organs or systems: Secondary | ICD-10-CM

## 2022-12-15 DIAGNOSIS — Z803 Family history of malignant neoplasm of breast: Secondary | ICD-10-CM

## 2022-12-15 DIAGNOSIS — G4733 Obstructive sleep apnea (adult) (pediatric): Secondary | ICD-10-CM | POA: Diagnosis present

## 2022-12-15 DIAGNOSIS — I719 Aortic aneurysm of unspecified site, without rupture: Secondary | ICD-10-CM | POA: Diagnosis not present

## 2022-12-15 DIAGNOSIS — Z952 Presence of prosthetic heart valve: Secondary | ICD-10-CM

## 2022-12-15 HISTORY — PX: TEE WITHOUT CARDIOVERSION: SHX5443

## 2022-12-15 HISTORY — PX: BENTALL PROCEDURE: SHX5058

## 2022-12-15 LAB — POCT I-STAT 7, (LYTES, BLD GAS, ICA,H+H)
Acid-Base Excess: 2 mmol/L (ref 0.0–2.0)
Acid-Base Excess: 2 mmol/L (ref 0.0–2.0)
Acid-Base Excess: 0 mmol/L (ref 0.0–2.0)
Acid-Base Excess: 2 mmol/L (ref 0.0–2.0)
Acid-Base Excess: 2 mmol/L (ref 0.0–2.0)
Acid-base deficit: 1 mmol/L (ref 0.0–2.0)
Acid-base deficit: 3 mmol/L — ABNORMAL HIGH (ref 0.0–2.0)
Acid-base deficit: 5 mmol/L — ABNORMAL HIGH (ref 0.0–2.0)
Bicarbonate: 24.8 mmol/L (ref 20.0–28.0)
Bicarbonate: 25 mmol/L (ref 20.0–28.0)
Bicarbonate: 24.7 mmol/L (ref 20.0–28.0)
Bicarbonate: 24 mmol/L (ref 20.0–28.0)
Bicarbonate: 25.6 mmol/L (ref 20.0–28.0)
Bicarbonate: 26.4 mmol/L (ref 20.0–28.0)
Bicarbonate: 21.5 mmol/L (ref 20.0–28.0)
Bicarbonate: 23.8 mmol/L (ref 20.0–28.0)
Calcium, Ion: 1.18 mmol/L (ref 1.15–1.40)
Calcium, Ion: 0.93 mmol/L — ABNORMAL LOW (ref 1.15–1.40)
Calcium, Ion: 0.89 mmol/L — CL (ref 1.15–1.40)
Calcium, Ion: 0.86 mmol/L — CL (ref 1.15–1.40)
Calcium, Ion: 0.88 mmol/L — CL (ref 1.15–1.40)
Calcium, Ion: 0.91 mmol/L — ABNORMAL LOW (ref 1.15–1.40)
Calcium, Ion: 0.99 mmol/L — ABNORMAL LOW (ref 1.15–1.40)
Calcium, Ion: 1.22 mmol/L (ref 1.15–1.40)
HCT: 36 % (ref 36.0–46.0)
HCT: 27 % — ABNORMAL LOW (ref 36.0–46.0)
HCT: 24 % — ABNORMAL LOW (ref 36.0–46.0)
HCT: 22 % — ABNORMAL LOW (ref 36.0–46.0)
HCT: 23 % — ABNORMAL LOW (ref 36.0–46.0)
HCT: 23 % — ABNORMAL LOW (ref 36.0–46.0)
HCT: 26 % — ABNORMAL LOW (ref 36.0–46.0)
HCT: 34 % — ABNORMAL LOW (ref 36.0–46.0)
Hemoglobin: 12.2 g/dL (ref 12.0–15.0)
Hemoglobin: 9.2 g/dL — ABNORMAL LOW (ref 12.0–15.0)
Hemoglobin: 8.2 g/dL — ABNORMAL LOW (ref 12.0–15.0)
Hemoglobin: 7.5 g/dL — ABNORMAL LOW (ref 12.0–15.0)
Hemoglobin: 7.8 g/dL — ABNORMAL LOW (ref 12.0–15.0)
Hemoglobin: 7.8 g/dL — ABNORMAL LOW (ref 12.0–15.0)
Hemoglobin: 8.8 g/dL — ABNORMAL LOW (ref 12.0–15.0)
Hemoglobin: 11.6 g/dL — ABNORMAL LOW (ref 12.0–15.0)
O2 Saturation: 100 %
O2 Saturation: 100 %
O2 Saturation: 100 %
O2 Saturation: 100 %
O2 Saturation: 100 %
O2 Saturation: 100 %
O2 Saturation: 100 %
O2 Saturation: 92 %
Patient temperature: 36.5
Potassium: 3 mmol/L — ABNORMAL LOW (ref 3.5–5.1)
Potassium: 3.3 mmol/L — ABNORMAL LOW (ref 3.5–5.1)
Potassium: 3 mmol/L — ABNORMAL LOW (ref 3.5–5.1)
Potassium: 3.4 mmol/L — ABNORMAL LOW (ref 3.5–5.1)
Potassium: 3.5 mmol/L (ref 3.5–5.1)
Potassium: 3.3 mmol/L — ABNORMAL LOW (ref 3.5–5.1)
Potassium: 3 mmol/L — ABNORMAL LOW (ref 3.5–5.1)
Potassium: 4.3 mmol/L (ref 3.5–5.1)
Sodium: 143 mmol/L (ref 135–145)
Sodium: 143 mmol/L (ref 135–145)
Sodium: 143 mmol/L (ref 135–145)
Sodium: 143 mmol/L (ref 135–145)
Sodium: 144 mmol/L (ref 135–145)
Sodium: 144 mmol/L (ref 135–145)
Sodium: 145 mmol/L (ref 135–145)
Sodium: 145 mmol/L (ref 135–145)
TCO2: 26 mmol/L (ref 22–32)
TCO2: 26 mmol/L (ref 22–32)
TCO2: 26 mmol/L (ref 22–32)
TCO2: 25 mmol/L (ref 22–32)
TCO2: 27 mmol/L (ref 22–32)
TCO2: 28 mmol/L (ref 22–32)
TCO2: 22 mmol/L (ref 22–32)
TCO2: 26 mmol/L (ref 22–32)
pCO2 arterial: 43 mm[Hg] (ref 32–48)
pCO2 arterial: 32 mm[Hg] (ref 32–48)
pCO2 arterial: 29.6 mm[Hg] — ABNORMAL LOW (ref 32–48)
pCO2 arterial: 33 mm[Hg] (ref 32–48)
pCO2 arterial: 34.9 mm[Hg] (ref 32–48)
pCO2 arterial: 39.3 mm[Hg] (ref 32–48)
pCO2 arterial: 33 mm[Hg] (ref 32–48)
pCO2 arterial: 62.7 mm[Hg] — ABNORMAL HIGH (ref 32–48)
pH, Arterial: 7.368 (ref 7.35–7.45)
pH, Arterial: 7.501 — ABNORMAL HIGH (ref 7.35–7.45)
pH, Arterial: 7.53 — ABNORMAL HIGH (ref 7.35–7.45)
pH, Arterial: 7.47 — ABNORMAL HIGH (ref 7.35–7.45)
pH, Arterial: 7.473 — ABNORMAL HIGH (ref 7.35–7.45)
pH, Arterial: 7.435 (ref 7.35–7.45)
pH, Arterial: 7.422 (ref 7.35–7.45)
pH, Arterial: 7.184 — CL (ref 7.35–7.45)
pO2, Arterial: 489 mm[Hg] — ABNORMAL HIGH (ref 83–108)
pO2, Arterial: 441 mm[Hg] — ABNORMAL HIGH (ref 83–108)
pO2, Arterial: 403 mm[Hg] — ABNORMAL HIGH (ref 83–108)
pO2, Arterial: 418 mm[Hg] — ABNORMAL HIGH (ref 83–108)
pO2, Arterial: 415 mm[Hg] — ABNORMAL HIGH (ref 83–108)
pO2, Arterial: 301 mm[Hg] — ABNORMAL HIGH (ref 83–108)
pO2, Arterial: 398 mm[Hg] — ABNORMAL HIGH (ref 83–108)
pO2, Arterial: 80 mm[Hg] — ABNORMAL LOW (ref 83–108)

## 2022-12-15 LAB — CBC
HCT: 32.2 % — ABNORMAL LOW (ref 36.0–46.0)
Hemoglobin: 10.6 g/dL — ABNORMAL LOW (ref 12.0–15.0)
MCH: 25 pg — ABNORMAL LOW (ref 26.0–34.0)
MCHC: 32.9 g/dL (ref 30.0–36.0)
MCV: 75.9 fL — ABNORMAL LOW (ref 80.0–100.0)
Platelets: 79 10*3/uL — ABNORMAL LOW (ref 150–400)
RBC: 4.24 MIL/uL (ref 3.87–5.11)
RDW: 17.2 % — ABNORMAL HIGH (ref 11.5–15.5)
WBC: 7.8 10*3/uL (ref 4.0–10.5)
nRBC: 0 % (ref 0.0–0.2)

## 2022-12-15 LAB — POCT I-STAT, CHEM 8
BUN: 8 mg/dL (ref 8–23)
BUN: 7 mg/dL — ABNORMAL LOW (ref 8–23)
BUN: 7 mg/dL — ABNORMAL LOW (ref 8–23)
BUN: 6 mg/dL — ABNORMAL LOW (ref 8–23)
BUN: 5 mg/dL — ABNORMAL LOW (ref 8–23)
BUN: 5 mg/dL — ABNORMAL LOW (ref 8–23)
BUN: 6 mg/dL — ABNORMAL LOW (ref 8–23)
BUN: 6 mg/dL — ABNORMAL LOW (ref 8–23)
Calcium, Ion: 1.17 mmol/L (ref 1.15–1.40)
Calcium, Ion: 1.15 mmol/L (ref 1.15–1.40)
Calcium, Ion: 0.94 mmol/L — ABNORMAL LOW (ref 1.15–1.40)
Calcium, Ion: 0.91 mmol/L — ABNORMAL LOW (ref 1.15–1.40)
Calcium, Ion: 0.87 mmol/L — CL (ref 1.15–1.40)
Calcium, Ion: 0.88 mmol/L — CL (ref 1.15–1.40)
Calcium, Ion: 0.9 mmol/L — ABNORMAL LOW (ref 1.15–1.40)
Calcium, Ion: 0.97 mmol/L — ABNORMAL LOW (ref 1.15–1.40)
Chloride: 104 mmol/L (ref 98–111)
Chloride: 103 mmol/L (ref 98–111)
Chloride: 100 mmol/L (ref 98–111)
Chloride: 105 mmol/L (ref 98–111)
Chloride: 104 mmol/L (ref 98–111)
Chloride: 104 mmol/L (ref 98–111)
Chloride: 103 mmol/L (ref 98–111)
Chloride: 106 mmol/L (ref 98–111)
Creatinine, Ser: 0.9 mg/dL (ref 0.44–1.00)
Creatinine, Ser: 0.9 mg/dL (ref 0.44–1.00)
Creatinine, Ser: 0.7 mg/dL (ref 0.44–1.00)
Creatinine, Ser: 0.7 mg/dL (ref 0.44–1.00)
Creatinine, Ser: 0.6 mg/dL (ref 0.44–1.00)
Creatinine, Ser: 0.7 mg/dL (ref 0.44–1.00)
Creatinine, Ser: 0.8 mg/dL (ref 0.44–1.00)
Creatinine, Ser: 0.8 mg/dL (ref 0.44–1.00)
Glucose, Bld: 118 mg/dL — ABNORMAL HIGH (ref 70–99)
Glucose, Bld: 116 mg/dL — ABNORMAL HIGH (ref 70–99)
Glucose, Bld: 87 mg/dL (ref 70–99)
Glucose, Bld: 120 mg/dL — ABNORMAL HIGH (ref 70–99)
Glucose, Bld: 127 mg/dL — ABNORMAL HIGH (ref 70–99)
Glucose, Bld: 129 mg/dL — ABNORMAL HIGH (ref 70–99)
Glucose, Bld: 133 mg/dL — ABNORMAL HIGH (ref 70–99)
Glucose, Bld: 143 mg/dL — ABNORMAL HIGH (ref 70–99)
HCT: 34 % — ABNORMAL LOW (ref 36.0–46.0)
HCT: 33 % — ABNORMAL LOW (ref 36.0–46.0)
HCT: 24 % — ABNORMAL LOW (ref 36.0–46.0)
HCT: 24 % — ABNORMAL LOW (ref 36.0–46.0)
HCT: 22 % — ABNORMAL LOW (ref 36.0–46.0)
HCT: 23 % — ABNORMAL LOW (ref 36.0–46.0)
HCT: 23 % — ABNORMAL LOW (ref 36.0–46.0)
HCT: 25 % — ABNORMAL LOW (ref 36.0–46.0)
Hemoglobin: 11.6 g/dL — ABNORMAL LOW (ref 12.0–15.0)
Hemoglobin: 11.2 g/dL — ABNORMAL LOW (ref 12.0–15.0)
Hemoglobin: 8.2 g/dL — ABNORMAL LOW (ref 12.0–15.0)
Hemoglobin: 8.2 g/dL — ABNORMAL LOW (ref 12.0–15.0)
Hemoglobin: 7.5 g/dL — ABNORMAL LOW (ref 12.0–15.0)
Hemoglobin: 7.8 g/dL — ABNORMAL LOW (ref 12.0–15.0)
Hemoglobin: 7.8 g/dL — ABNORMAL LOW (ref 12.0–15.0)
Hemoglobin: 8.5 g/dL — ABNORMAL LOW (ref 12.0–15.0)
Potassium: 3 mmol/L — ABNORMAL LOW (ref 3.5–5.1)
Potassium: 3.1 mmol/L — ABNORMAL LOW (ref 3.5–5.1)
Potassium: 3.1 mmol/L — ABNORMAL LOW (ref 3.5–5.1)
Potassium: 3.7 mmol/L (ref 3.5–5.1)
Potassium: 3.7 mmol/L (ref 3.5–5.1)
Potassium: 3.4 mmol/L — ABNORMAL LOW (ref 3.5–5.1)
Potassium: 3 mmol/L — ABNORMAL LOW (ref 3.5–5.1)
Potassium: 3.1 mmol/L — ABNORMAL LOW (ref 3.5–5.1)
Sodium: 143 mmol/L (ref 135–145)
Sodium: 142 mmol/L (ref 135–145)
Sodium: 142 mmol/L (ref 135–145)
Sodium: 142 mmol/L (ref 135–145)
Sodium: 143 mmol/L (ref 135–145)
Sodium: 144 mmol/L (ref 135–145)
Sodium: 145 mmol/L (ref 135–145)
Sodium: 145 mmol/L (ref 135–145)
TCO2: 27 mmol/L (ref 22–32)
TCO2: 26 mmol/L (ref 22–32)
TCO2: 29 mmol/L (ref 22–32)
TCO2: 30 mmol/L (ref 22–32)
TCO2: 27 mmol/L (ref 22–32)
TCO2: 27 mmol/L (ref 22–32)
TCO2: 24 mmol/L (ref 22–32)
TCO2: 26 mmol/L (ref 22–32)

## 2022-12-15 LAB — GLUCOSE, CAPILLARY
Glucose-Capillary: 103 mg/dL — ABNORMAL HIGH (ref 70–99)
Glucose-Capillary: 131 mg/dL — ABNORMAL HIGH (ref 70–99)
Glucose-Capillary: 132 mg/dL — ABNORMAL HIGH (ref 70–99)
Glucose-Capillary: 179 mg/dL — ABNORMAL HIGH (ref 70–99)
Glucose-Capillary: 135 mg/dL — ABNORMAL HIGH (ref 70–99)
Glucose-Capillary: 151 mg/dL — ABNORMAL HIGH (ref 70–99)
Glucose-Capillary: 122 mg/dL — ABNORMAL HIGH (ref 70–99)

## 2022-12-15 LAB — POCT I-STAT EG7
Acid-Base Excess: 2 mmol/L (ref 0.0–2.0)
Bicarbonate: 26.4 mmol/L (ref 20.0–28.0)
Calcium, Ion: 0.97 mmol/L — ABNORMAL LOW (ref 1.15–1.40)
HCT: 26 % — ABNORMAL LOW (ref 36.0–46.0)
Hemoglobin: 8.8 g/dL — ABNORMAL LOW (ref 12.0–15.0)
O2 Saturation: 83 %
Potassium: 3.1 mmol/L — ABNORMAL LOW (ref 3.5–5.1)
Sodium: 146 mmol/L — ABNORMAL HIGH (ref 135–145)
TCO2: 28 mmol/L (ref 22–32)
pCO2, Ven: 37.8 mm[Hg] — ABNORMAL LOW (ref 44–60)
pH, Ven: 7.452 — ABNORMAL HIGH (ref 7.25–7.43)
pO2, Ven: 45 mm[Hg] (ref 32–45)

## 2022-12-15 LAB — HEMOGLOBIN AND HEMATOCRIT, BLOOD
HCT: 21.5 % — ABNORMAL LOW (ref 36.0–46.0)
Hemoglobin: 7 g/dL — ABNORMAL LOW (ref 12.0–15.0)

## 2022-12-15 LAB — APTT: aPTT: 44 s — ABNORMAL HIGH (ref 24–36)

## 2022-12-15 LAB — PROTIME-INR
INR: 1.7 — ABNORMAL HIGH (ref 0.8–1.2)
Prothrombin Time: 20.2 s — ABNORMAL HIGH (ref 11.4–15.2)

## 2022-12-15 LAB — FIBRINOGEN: Fibrinogen: 198 mg/dL — ABNORMAL LOW (ref 210–475)

## 2022-12-15 LAB — PLATELET COUNT: Platelets: 62 10*3/uL — ABNORMAL LOW (ref 150–400)

## 2022-12-15 LAB — PREPARE RBC (CROSSMATCH)

## 2022-12-15 SURGERY — REDO STERNOTOMY
Anesthesia: General | Site: Chest

## 2022-12-15 MED ORDER — ROCURONIUM BROMIDE 10 MG/ML (PF) SYRINGE
PREFILLED_SYRINGE | INTRAVENOUS | Status: DC | PRN
  Administered 2022-12-15 (×2): 30 mg via INTRAVENOUS
  Administered 2022-12-15: 50 mg via INTRAVENOUS
  Administered 2022-12-15: 30 mg via INTRAVENOUS
  Administered 2022-12-15: 50 mg via INTRAVENOUS
  Administered 2022-12-15: 70 mg via INTRAVENOUS

## 2022-12-15 MED ORDER — THROMBIN (RECOMBINANT) 20000 UNITS EX SOLR
CUTANEOUS | Status: DC | PRN
  Administered 2022-12-15: 20000 [IU] via TOPICAL

## 2022-12-15 MED ORDER — FENTANYL CITRATE (PF) 250 MCG/5ML IJ SOLN
INTRAMUSCULAR | Status: AC
  Filled 2022-12-15: qty 5

## 2022-12-15 MED ORDER — MIDAZOLAM HCL 2 MG/2ML IJ SOLN: 2.0000 mg | INTRAMUSCULAR | Status: DC | PRN

## 2022-12-15 MED ORDER — ALBUMIN HUMAN 5 % IV SOLN
250.0000 mL | INTRAVENOUS | Status: DC | PRN
  Administered 2022-12-15: 12.5 g via INTRAVENOUS

## 2022-12-15 MED ORDER — METOPROLOL TARTRATE 12.5 MG HALF TABLET
12.5000 mg | ORAL_TABLET | Freq: Once | ORAL | Status: DC
  Filled 2022-12-15: qty 1

## 2022-12-15 MED ORDER — VANCOMYCIN HCL IN DEXTROSE 1-5 GM/200ML-% IV SOLN
1000.0000 mg | Freq: Once | INTRAVENOUS | Status: AC
Start: 2022-12-15 — End: 2022-12-15
  Administered 2022-12-15: 1000 mg via INTRAVENOUS
  Filled 2022-12-15: qty 200

## 2022-12-15 MED ORDER — HEPARIN SODIUM (PORCINE) 1000 UNIT/ML IJ SOLN
INTRAMUSCULAR | Status: DC | PRN
  Administered 2022-12-15: 36000 [IU] via INTRAVENOUS

## 2022-12-15 MED ORDER — CHLORHEXIDINE GLUCONATE 0.12 % MT SOLN
15.0000 mL | OROMUCOSAL | Status: AC
  Administered 2022-12-15: 15 mL via OROMUCOSAL
  Filled 2022-12-15: qty 15

## 2022-12-15 MED ORDER — CHLORHEXIDINE GLUCONATE 0.12 % MT SOLN
15.0000 mL | Freq: Once | OROMUCOSAL | Status: AC
  Administered 2022-12-15: 15 mL via OROMUCOSAL
  Filled 2022-12-15: qty 15

## 2022-12-15 MED ORDER — SODIUM CHLORIDE 0.45 % IV SOLN: INTRAVENOUS | Status: DC | PRN

## 2022-12-15 MED ORDER — MORPHINE SULFATE (PF) 2 MG/ML IV SOLN
1.0000 mg | INTRAVENOUS | Status: DC | PRN
  Administered 2022-12-16 – 2022-12-18 (×4): 2 mg via INTRAVENOUS
  Filled 2022-12-15 (×5): qty 1

## 2022-12-15 MED ORDER — TRAMADOL HCL 50 MG PO TABS
50.0000 mg | ORAL_TABLET | ORAL | Status: DC | PRN
  Administered 2022-12-16: 100 mg via ORAL
  Filled 2022-12-15: qty 2

## 2022-12-15 MED ORDER — CHLORHEXIDINE GLUCONATE 4 % EX SOLN: 30.0000 mL | CUTANEOUS | Status: DC

## 2022-12-15 MED ORDER — POTASSIUM CHLORIDE 10 MEQ/50ML IV SOLN
10.0000 meq | INTRAVENOUS | Status: DC
  Administered 2022-12-15: 10 meq via INTRAVENOUS

## 2022-12-15 MED ORDER — ATORVASTATIN CALCIUM 40 MG PO TABS
40.0000 mg | ORAL_TABLET | Freq: Every evening | ORAL | Status: DC
  Administered 2022-12-15 – 2022-12-21 (×7): 40 mg via ORAL
  Filled 2022-12-15 (×7): qty 1

## 2022-12-15 MED ORDER — LACTATED RINGERS IV SOLN: INTRAVENOUS | Status: AC

## 2022-12-15 MED ORDER — ACETAMINOPHEN 500 MG PO TABS
1000.0000 mg | ORAL_TABLET | Freq: Four times a day (QID) | ORAL | Status: AC
  Administered 2022-12-16 – 2022-12-20 (×16): 1000 mg via ORAL
  Filled 2022-12-15 (×16): qty 2

## 2022-12-15 MED ORDER — METOPROLOL TARTRATE 5 MG/5ML IV SOLN: 2.5000 mg | INTRAVENOUS | Status: DC | PRN

## 2022-12-15 MED ORDER — NOREPINEPHRINE 4 MG/250ML-% IV SOLN: 0.0000 ug/min | INTRAVENOUS | Status: DC

## 2022-12-15 MED ORDER — METOPROLOL TARTRATE 25 MG/10 ML ORAL SUSPENSION: 12.5000 mg | Freq: Two times a day (BID) | ORAL | Status: DC

## 2022-12-15 MED ORDER — PANTOPRAZOLE SODIUM 40 MG PO TBEC
40.0000 mg | DELAYED_RELEASE_TABLET | Freq: Every day | ORAL | Status: DC
  Administered 2022-12-17 – 2022-12-22 (×6): 40 mg via ORAL
  Filled 2022-12-15 (×6): qty 1

## 2022-12-15 MED ORDER — PROPOFOL 10 MG/ML IV BOLUS
INTRAVENOUS | Status: AC
  Filled 2022-12-15: qty 20

## 2022-12-15 MED ORDER — CHLORHEXIDINE GLUCONATE 0.12 % MT SOLN: 15.0000 mL | Freq: Once | OROMUCOSAL | Status: DC

## 2022-12-15 MED ORDER — MIDAZOLAM HCL (PF) 10 MG/2ML IJ SOLN
INTRAMUSCULAR | Status: AC
  Filled 2022-12-15: qty 2

## 2022-12-15 MED ORDER — ORAL CARE MOUTH RINSE: 15.0000 mL | OROMUCOSAL | Status: DC | PRN

## 2022-12-15 MED ORDER — METOPROLOL TARTRATE 12.5 MG HALF TABLET: 12.5000 mg | ORAL_TABLET | Freq: Two times a day (BID) | ORAL | Status: DC

## 2022-12-15 MED ORDER — ASPIRIN 81 MG PO CHEW
324.0000 mg | CHEWABLE_TABLET | Freq: Once | ORAL | Status: AC
  Administered 2022-12-15: 324 mg via ORAL
  Filled 2022-12-15: qty 4

## 2022-12-15 MED ORDER — HEMOSTATIC AGENTS (NO CHARGE) OPTIME
TOPICAL | Status: DC | PRN
  Administered 2022-12-15 (×4): 1 via TOPICAL

## 2022-12-15 MED ORDER — ORAL CARE MOUTH RINSE
15.0000 mL | OROMUCOSAL | Status: DC
  Administered 2022-12-15 – 2022-12-16 (×6): 15 mL via OROMUCOSAL

## 2022-12-15 MED ORDER — PANTOPRAZOLE SODIUM 40 MG IV SOLR
40.0000 mg | Freq: Every day | INTRAVENOUS | Status: AC
  Administered 2022-12-15 – 2022-12-16 (×2): 40 mg via INTRAVENOUS
  Filled 2022-12-15 (×2): qty 10

## 2022-12-15 MED ORDER — BISACODYL 5 MG PO TBEC
10.0000 mg | DELAYED_RELEASE_TABLET | Freq: Every day | ORAL | Status: DC
  Administered 2022-12-16 – 2022-12-17 (×2): 10 mg via ORAL
  Filled 2022-12-15 (×2): qty 2

## 2022-12-15 MED ORDER — ASPIRIN 325 MG PO TBEC: 325.0000 mg | DELAYED_RELEASE_TABLET | Freq: Every day | ORAL | Status: DC

## 2022-12-15 MED ORDER — SODIUM CHLORIDE 0.9% FLUSH: 3.0000 mL | INTRAVENOUS | Status: DC | PRN

## 2022-12-15 MED ORDER — PROTAMINE SULFATE 10 MG/ML IV SOLN
INTRAVENOUS | Status: DC | PRN
  Administered 2022-12-15: 340 mg via INTRAVENOUS
  Administered 2022-12-15: 20 mg via INTRAVENOUS

## 2022-12-15 MED ORDER — 0.9 % SODIUM CHLORIDE (POUR BTL) OPTIME
TOPICAL | Status: DC | PRN
  Administered 2022-12-15: 5000 mL
  Administered 2022-12-15: 2000 mL

## 2022-12-15 MED ORDER — ACETAMINOPHEN 160 MG/5ML PO SOLN
1000.0000 mg | Freq: Four times a day (QID) | ORAL | Status: AC
  Administered 2022-12-15 – 2022-12-16 (×2): 1000 mg
  Filled 2022-12-15 (×2): qty 40.6

## 2022-12-15 MED ORDER — BISACODYL 10 MG RE SUPP: 10.0000 mg | Freq: Every day | RECTAL | Status: DC

## 2022-12-15 MED ORDER — LACTATED RINGERS IV SOLN: INTRAVENOUS | Status: DC

## 2022-12-15 MED ORDER — INSULIN REGULAR(HUMAN) IN NACL 100-0.9 UT/100ML-% IV SOLN
INTRAVENOUS | Status: DC
  Administered 2022-12-15: 1 [IU]/h via INTRAVENOUS

## 2022-12-15 MED ORDER — LACTATED RINGERS IV SOLN: INTRAVENOUS | Status: DC | PRN

## 2022-12-15 MED ORDER — MIDAZOLAM HCL (PF) 5 MG/ML IJ SOLN
INTRAMUSCULAR | Status: DC | PRN
  Administered 2022-12-15: 2 mg via INTRAVENOUS
  Administered 2022-12-15 (×2): 1 mg via INTRAVENOUS

## 2022-12-15 MED ORDER — SODIUM BICARBONATE 8.4 % IV SOLN
50.0000 meq | Freq: Once | INTRAVENOUS | Status: AC
  Administered 2022-12-15: 50 meq via INTRAVENOUS

## 2022-12-15 MED ORDER — DEXMEDETOMIDINE HCL IN NACL 400 MCG/100ML IV SOLN: 0.0000 ug/kg/h | INTRAVENOUS | Status: DC

## 2022-12-15 MED ORDER — ONDANSETRON HCL 4 MG/2ML IJ SOLN
4.0000 mg | Freq: Four times a day (QID) | INTRAMUSCULAR | Status: DC | PRN
  Administered 2022-12-16: 4 mg via INTRAVENOUS
  Filled 2022-12-15: qty 2

## 2022-12-15 MED ORDER — ACETAMINOPHEN 160 MG/5ML PO SOLN
650.0000 mg | Freq: Once | ORAL | Status: AC
  Administered 2022-12-15: 650 mg
  Filled 2022-12-15: qty 20.3

## 2022-12-15 MED ORDER — DEXTROSE 50 % IV SOLN
0.0000 mL | INTRAVENOUS | Status: DC | PRN
Start: 2022-12-15 — End: 2022-12-16

## 2022-12-15 MED ORDER — CEFAZOLIN SODIUM-DEXTROSE 2-4 GM/100ML-% IV SOLN
2.0000 g | Freq: Three times a day (TID) | INTRAVENOUS | Status: AC
  Administered 2022-12-16 – 2022-12-17 (×6): 2 g via INTRAVENOUS
  Filled 2022-12-15 (×6): qty 100

## 2022-12-15 MED ORDER — SODIUM CHLORIDE 0.9% FLUSH
3.0000 mL | Freq: Two times a day (BID) | INTRAVENOUS | Status: DC
  Administered 2022-12-16 – 2022-12-21 (×10): 3 mL via INTRAVENOUS

## 2022-12-15 MED ORDER — SODIUM CHLORIDE 0.9 % IV SOLN: INTRAVENOUS | Status: DC

## 2022-12-15 MED ORDER — CHLORHEXIDINE GLUCONATE CLOTH 2 % EX PADS
6.0000 | MEDICATED_PAD | Freq: Every day | CUTANEOUS | Status: DC
  Administered 2022-12-15 – 2022-12-19 (×5): 6 via TOPICAL

## 2022-12-15 MED ORDER — METHADONE HCL IV SYRINGE 10 MG/ML FOR CABG
0.3000 mg/kg | Freq: Once | INTRAMUSCULAR | Status: AC
  Administered 2022-12-15: 16 mg via INTRAVENOUS
  Filled 2022-12-15: qty 1.6

## 2022-12-15 MED ORDER — SODIUM CHLORIDE 0.9 % IV SOLN: 250.0000 mL | INTRAVENOUS | Status: AC

## 2022-12-15 MED ORDER — THROMBIN 20000 UNITS EX SOLR
OROMUCOSAL | Status: DC | PRN
  Administered 2022-12-15 (×3): 4 mL via TOPICAL

## 2022-12-15 MED ORDER — CALCIUM CHLORIDE 10 % IV SOLN
INTRAVENOUS | Status: DC | PRN
  Administered 2022-12-15: 100 mg via INTRAVENOUS
  Administered 2022-12-15 (×3): 200 mg via INTRAVENOUS
  Administered 2022-12-15: 100 mg via INTRAVENOUS

## 2022-12-15 MED ORDER — METOCLOPRAMIDE HCL 5 MG/ML IJ SOLN
10.0000 mg | Freq: Four times a day (QID) | INTRAMUSCULAR | Status: AC
  Administered 2022-12-15 – 2022-12-16 (×6): 10 mg via INTRAVENOUS
  Filled 2022-12-15 (×6): qty 2

## 2022-12-15 MED ORDER — THROMBIN (RECOMBINANT) 20000 UNITS EX SOLR
CUTANEOUS | Status: AC
  Filled 2022-12-15: qty 20000

## 2022-12-15 MED ORDER — DOCUSATE SODIUM 100 MG PO CAPS
200.0000 mg | ORAL_CAPSULE | Freq: Every day | ORAL | Status: DC
Start: 2022-12-16 — End: 2022-12-22
  Administered 2022-12-16 – 2022-12-21 (×4): 200 mg via ORAL
  Filled 2022-12-15 (×4): qty 2

## 2022-12-15 MED ORDER — ASPIRIN 81 MG PO CHEW: 324.0000 mg | CHEWABLE_TABLET | Freq: Every day | ORAL | Status: DC

## 2022-12-15 MED ORDER — HEMOSTATIC AGENTS (NO CHARGE) OPTIME
TOPICAL | Status: DC | PRN
  Administered 2022-12-15: 1 via TOPICAL

## 2022-12-15 MED ORDER — PROPOFOL 10 MG/ML IV BOLUS
INTRAVENOUS | Status: DC | PRN
  Administered 2022-12-15: 70 mg via INTRAVENOUS

## 2022-12-15 MED ORDER — OXYCODONE HCL 5 MG PO TABS
5.0000 mg | ORAL_TABLET | ORAL | Status: DC | PRN
  Administered 2022-12-16: 10 mg via ORAL
  Administered 2022-12-16: 5 mg via ORAL
  Administered 2022-12-16: 10 mg via ORAL
  Filled 2022-12-15: qty 2
  Filled 2022-12-15: qty 1
  Filled 2022-12-15: qty 2

## 2022-12-15 MED ORDER — POTASSIUM CHLORIDE 10 MEQ/50ML IV SOLN
10.0000 meq | INTRAVENOUS | Status: AC
  Administered 2022-12-15 (×5): 10 meq via INTRAVENOUS
  Filled 2022-12-15 (×3): qty 50

## 2022-12-15 MED ORDER — FOLIC ACID 1 MG PO TABS
1.0000 mg | ORAL_TABLET | Freq: Every day | ORAL | Status: DC
Start: 2022-12-16 — End: 2022-12-22
  Administered 2022-12-16 – 2022-12-22 (×7): 1 mg via ORAL
  Filled 2022-12-15 (×7): qty 1

## 2022-12-15 MED ORDER — ~~LOC~~ CARDIAC SURGERY, PATIENT & FAMILY EDUCATION
Freq: Once | Status: DC
  Filled 2022-12-15: qty 1

## 2022-12-15 MED ORDER — MAGNESIUM SULFATE 4 GM/100ML IV SOLN
4.0000 g | Freq: Once | INTRAVENOUS | Status: AC
Start: 2022-12-15 — End: 2022-12-15
  Administered 2022-12-15: 4 g via INTRAVENOUS
  Filled 2022-12-15: qty 100

## 2022-12-15 MED ORDER — FENTANYL CITRATE (PF) 250 MCG/5ML IJ SOLN
INTRAMUSCULAR | Status: DC | PRN
  Administered 2022-12-15: 100 ug via INTRAVENOUS
  Administered 2022-12-15: 50 ug via INTRAVENOUS
  Administered 2022-12-15 (×2): 100 ug via INTRAVENOUS
  Administered 2022-12-15: 50 ug via INTRAVENOUS

## 2022-12-15 MED ORDER — PLASMA-LYTE A IV SOLN
INTRAVENOUS | Status: DC | PRN
  Administered 2022-12-15: 500 mL

## 2022-12-15 SURGICAL SUPPLY — 85 items
ADAPTER CARDIO PERF ANTE/RETRO (ADAPTER) ×2 IMPLANT
APPLICATOR TIP COSEAL (VASCULAR PRODUCTS) IMPLANT
BAG DECANTER FOR FLEXI CONT (MISCELLANEOUS) ×2 IMPLANT
BLADE CLIPPER SURG (BLADE) ×2 IMPLANT
BLADE CORE FAN STRYKER (BLADE) ×2 IMPLANT
BLADE STERNUM SYSTEM 6 (BLADE) ×2 IMPLANT
BLADE SURG 15 STRL LF DISP TIS (BLADE) ×2 IMPLANT
CANISTER SUCT 3000ML PPV (MISCELLANEOUS) ×2 IMPLANT
CANNULA EZ GLIDE AORTIC 21FR (CANNULA) IMPLANT
CANNULA GUNDRY RCSP 15FR (MISCELLANEOUS) ×2 IMPLANT
CANNULA MC2 2 STG 36/46 CONN (CANNULA) IMPLANT
CANNULA SNGLE STGE 40FR VENOUS (CANNULA) IMPLANT
CATH HEART VENT LEFT (CATHETERS) ×2 IMPLANT
CATH ROBINSON RED A/P 18FR (CATHETERS) ×6 IMPLANT
CATH THORACIC 36FR (CATHETERS) ×2 IMPLANT
CATH THORACIC 36FR RT ANG (CATHETERS) ×2 IMPLANT
CAUTERY HI TEMP FINE TIP 2 (MISCELLANEOUS) IMPLANT
CNTNR URN SCR LID CUP LEK RST (MISCELLANEOUS) ×2 IMPLANT
CONN ST 3/8 X 1/2 (MISCELLANEOUS) IMPLANT
CONN Y 3/8X3/8X3/8 BEN (MISCELLANEOUS) IMPLANT
CONTAINER PROTECT SURGISLUSH (MISCELLANEOUS) ×4 IMPLANT
DRAPE WARM FLUID 44X44 (DRAPES) IMPLANT
DRSG AQUACEL AG ADV 3.5X14 (GAUZE/BANDAGES/DRESSINGS) IMPLANT
DRSG COVADERM 4X14 (GAUZE/BANDAGES/DRESSINGS) ×2 IMPLANT
ELECT BLADE 4.0 EZ CLEAN MEGAD (MISCELLANEOUS) ×2
ELECT CAUTERY BLADE 6.4 (BLADE) ×2 IMPLANT
ELECT REM PT RETURN 9FT ADLT (ELECTROSURGICAL) ×4
ELECTRODE BLDE 4.0 EZ CLN MEGD (MISCELLANEOUS) IMPLANT
ELECTRODE REM PT RTRN 9FT ADLT (ELECTROSURGICAL) ×4 IMPLANT
FELT TEFLON 1X6 (MISCELLANEOUS) ×2 IMPLANT
GAUZE 4X4 16PLY ~~LOC~~+RFID DBL (SPONGE) ×2 IMPLANT
GAUZE SPONGE 4X4 12PLY STRL (GAUZE/BANDAGES/DRESSINGS) ×2 IMPLANT
GLOVE BIO SURGEON STRL SZ 6 (GLOVE) IMPLANT
GLOVE BIO SURGEON STRL SZ 6.5 (GLOVE) IMPLANT
GLOVE BIO SURGEON STRL SZ7 (GLOVE) IMPLANT
GLOVE BIO SURGEON STRL SZ7.5 (GLOVE) IMPLANT
GLOVE SURG MICRO LTX SZ7 (GLOVE) ×4 IMPLANT
GOWN STRL REUS W/ TWL LRG LVL3 (GOWN DISPOSABLE) ×8 IMPLANT
GOWN STRL REUS W/ TWL XL LVL3 (GOWN DISPOSABLE) ×2 IMPLANT
HEMOSTAT POWDER SURGIFOAM 1G (HEMOSTASIS) ×6 IMPLANT
HEMOSTAT SURGICEL 2X14 (HEMOSTASIS) ×2 IMPLANT
KIT BASIN OR (CUSTOM PROCEDURE TRAY) ×2 IMPLANT
KIT CATH CPB BARTLE (MISCELLANEOUS) ×2 IMPLANT
KIT SUCTION CATH 14FR (SUCTIONS) ×2 IMPLANT
KIT TURNOVER KIT B (KITS) ×2 IMPLANT
LEAD PACING MYOCARDI (MISCELLANEOUS) IMPLANT
LINE VENT (MISCELLANEOUS) IMPLANT
NS IRRIG 1000ML POUR BTL (IV SOLUTION) ×10 IMPLANT
PACK E OPEN HEART (SUTURE) ×2 IMPLANT
PACK OPEN HEART (CUSTOM PROCEDURE TRAY) ×2 IMPLANT
PAD ARMBOARD 7.5X6 YLW CONV (MISCELLANEOUS) ×4 IMPLANT
POSITIONER HEAD DONUT 9IN (MISCELLANEOUS) ×2 IMPLANT
SEALANT SURG COSEAL 8ML (VASCULAR PRODUCTS) IMPLANT
SET MPS 3-ND DEL (MISCELLANEOUS) IMPLANT
SPONGE T-LAP 18X18 ~~LOC~~+RFID (SPONGE) ×8 IMPLANT
SPONGE T-LAP 4X18 ~~LOC~~+RFID (SPONGE) ×2 IMPLANT
SUT BONE WAX W31G (SUTURE) ×2 IMPLANT
SUT EB EXC GRN/WHT 2-0 V-5 (SUTURE) ×4 IMPLANT
SUT ETHIBON EXCEL 2-0 V-5 (SUTURE) IMPLANT
SUT ETHIBOND 2 0 SH 36X2 (SUTURE) IMPLANT
SUT ETHIBOND V-5 VALVE (SUTURE) IMPLANT
SUT PROLENE 3 0 SH 1 (SUTURE) ×2 IMPLANT
SUT PROLENE 3 0 SH 48 (SUTURE) ×4 IMPLANT
SUT PROLENE 3 0 SH DA (SUTURE) IMPLANT
SUT PROLENE 3 0 SH1 36 (SUTURE) IMPLANT
SUT PROLENE 4-0 RB1 .5 CRCL 36 (SUTURE) ×8 IMPLANT
SUT SILK 2 0 SH CR/8 (SUTURE) IMPLANT
SUT STEEL 6MS V (SUTURE) IMPLANT
SUT STEEL STERNAL CCS#1 18IN (SUTURE) IMPLANT
SUT STEEL SZ 6 DBL 3X14 BALL (SUTURE) IMPLANT
SUT VIC AB 1 CTX36XBRD ANBCTR (SUTURE) ×4 IMPLANT
SUT VIC AB 2-0 CT1 TAPERPNT 27 (SUTURE) IMPLANT
SUT VIC AB 3-0 X1 27 (SUTURE) IMPLANT
SYSTEM SAHARA CHEST DRAIN ATS (WOUND CARE) ×2 IMPLANT
TAPE PAPER 2X10 WHT MICROPORE (GAUZE/BANDAGES/DRESSINGS) IMPLANT
TAPE PAPER 3X10 WHT MICROPORE (GAUZE/BANDAGES/DRESSINGS) IMPLANT
TOWEL GREEN STERILE (TOWEL DISPOSABLE) ×2 IMPLANT
TOWEL GREEN STERILE FF (TOWEL DISPOSABLE) ×2 IMPLANT
TRAY FOLEY SLVR 14FR TEMP STAT (SET/KITS/TRAYS/PACK) ×2 IMPLANT
TRAY FOLEY SLVR 16FR TEMP STAT (SET/KITS/TRAYS/PACK) IMPLANT
TUBE CONNECTING 20X1/4 (TUBING) IMPLANT
UNDERPAD 30X36 HEAVY ABSORB (UNDERPADS AND DIAPERS) ×2 IMPLANT
VALVE AORTIC KONECT RESILIA 23 (Valve) IMPLANT
VENT LEFT HEART 12002 (CATHETERS) ×2
WATER STERILE IRR 1000ML POUR (IV SOLUTION) ×4 IMPLANT

## 2022-12-15 NOTE — Progress Notes (Signed)
Rapid wean initiated per protocol  

## 2022-12-15 NOTE — Transfer of Care (Addendum)
Immediate Anesthesia Transfer of Care Note  Patient: Alexis Wheeler  Procedure(s) Performed: REDO STERNOTOMY BENTALL PROCEDURE USING 23 MM KONECT RESILIA AORTIC VALVED CONDUIT (Chest) TRANSESOPHAGEAL ECHOCARDIOGRAM (TEE)  Patient Location: ICU  Anesthesia Type:General  Level of Consciousness: sedated and Patient remains intubated per anesthesia plan  Airway & Oxygen Therapy: Patient remains intubated per anesthesia plan and Patient placed on Ventilator (see vital sign flow sheet for setting)  Post-op Assessment: Report given to RN and Post -op Vital signs reviewed and stable  Post vital signs: Reviewed and stable  Last Vitals:  Vitals Value Taken Time  BP 108/68 12/15/22 1715  Temp 35.6 C 12/15/22 1724  Pulse 80 12/15/22 1724  Resp 11 12/15/22 1724  SpO2 92 % 12/15/22 1724  Vitals shown include unfiled device data.  Last Pain:  Vitals:   12/15/22 0629  TempSrc:   PainSc: 0-No pain         Complications: No notable events documented.

## 2022-12-15 NOTE — Progress Notes (Signed)
Pt passed mechanics but wean ABG notably acidotic 7.17 with pCO2 60s. Placed back on full support by RT at this time.

## 2022-12-15 NOTE — Anesthesia Procedure Notes (Signed)
Central Venous Catheter Insertion Performed by: Atilano Median, DO, anesthesiologist Start/End12/03/2022 7:07 AM, 12/15/2022 7:09 AM Patient location: Pre-op. Preanesthetic checklist: patient identified, IV checked, site marked, risks and benefits discussed, surgical consent, monitors and equipment checked, pre-op evaluation, timeout performed and anesthesia consent Position: supine Hand hygiene performed  and maximum sterile barriers used  PA cath was placed.Swan type:thermodilution Procedure performed without using ultrasound guided technique. Attempts: 1 Patient tolerated the procedure well with no immediate complications.

## 2022-12-15 NOTE — Anesthesia Procedure Notes (Signed)
Central Venous Catheter Insertion Performed by: Atilano Median, DO, anesthesiologist Start/End12/03/2022 6:55 AM, 12/15/2022 7:10 AM Patient location: Pre-op. Preanesthetic checklist: patient identified, IV checked, site marked, risks and benefits discussed, surgical consent, monitors and equipment checked, pre-op evaluation, timeout performed and anesthesia consent Position: Trendelenburg Lidocaine 1% used for infiltration and patient sedated Hand hygiene performed  and maximum sterile barriers used  Catheter size: 8.5 Fr Sheath introducer Procedure performed using ultrasound guided technique. Ultrasound Notes:anatomy identified, needle tip was noted to be adjacent to the nerve/plexus identified, no ultrasound evidence of intravascular and/or intraneural injection and image(s) printed for medical record Attempts: 1 Following insertion, line sutured and dressing applied. Post procedure assessment: blood return through all ports, free fluid flow and no air  Patient tolerated the procedure well with no immediate complications.

## 2022-12-15 NOTE — Progress Notes (Signed)
Patient ID: Alexis Wheeler, female   DOB: 1955/09/28, 67 y.o.   MRN: [id number]  TCTS Evening Rounds:   Hemodynamically stable, atrial paced 80 CI = 2 but swan not working at times.  Asleep on vent.  Urine output good  CT output low  CBC    Component Value Date/Time   WBC 7.8 12/15/2022 1718   RBC 4.24 12/15/2022 1718   HGB 10.6 (L) 12/15/2022 1718   HGB 13.2 09/22/2022 0952   HCT 32.2 (L) 12/15/2022 1718   HCT 43.2 09/22/2022 0952   PLT 79 (L) 12/15/2022 1718   PLT 217 09/22/2022 0952   MCV 75.9 (L) 12/15/2022 1718   MCV 76 (L) 09/22/2022 0952   MCH 25.0 (L) 12/15/2022 1718   MCHC 32.9 12/15/2022 1718   RDW 17.2 (H) 12/15/2022 1718   RDW 15.8 (H) 09/22/2022 0952   LYMPHSABS 2.3 10/20/2022 1127   MONOABS 0.6 10/20/2022 1127   EOSABS 0.3 10/20/2022 1127   BASOSABS 0.0 10/20/2022 1127     BMET    Component Value Date/Time   NA 145 12/15/2022 1527   NA 144 10/23/2022 1232   K 3.0 (L) 12/15/2022 1527   CL 106 12/15/2022 1524   CO2 28 12/14/2022 1227   GLUCOSE 133 (H) 12/15/2022 1524   BUN 6 (L) 12/15/2022 1524   BUN 13 10/23/2022 1232   CREATININE 0.80 12/15/2022 1524   CREATININE 0.86 09/04/2022 1046   CALCIUM 9.1 12/14/2022 1227   EGFR 59 (L) 10/23/2022 1232   GFRNONAA >60 12/14/2022 1227   GFRNONAA 58 (L) 05/23/2020 0000     A/P:  Stable postop course. Continue current plans

## 2022-12-15 NOTE — Progress Notes (Signed)
Pt placed on CPAP/PS per RWP by RT.

## 2022-12-15 NOTE — Op Note (Signed)
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   Biological Bentall Procedure using a 23 mm Edwards KONECT RESILIA pericardial valved conduit. 4.   Reimplantation of right and left coronary artery into conduit.  Anesthesia:  General Endotracheal   Clinical History/Surgical Indication:    This 67 year old woman has developed moderate to severe AI after aortic dissection repair with resuspension of the aortic valve in 2016.  She has had progressive aortic root dilatation to 5.1 cm with worsening aortic insufficiency which is now moderate to severe.  She has NYHA class II symptoms of exertional fatigue and shortness of breath.  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  It extends up into the innominate artery.  The left renal artery arises from the false lumen and is significantly hypoperfused.  The accessory left renal artery is smaller and supplies the lower pole.  The remainder of the abdominal vessels come off of the true lumen.  The proximal descending thoracic aorta measures 4 cm.  The proximal abdominal aorta measures 3.1 cm.  I think the best option is to proceed with redo sternotomy for aortic root replacement using a bioprosthetic valve graft.  She is becoming symptomatic and I would expect her aortic insufficiency to continue to worsen.  Her operative risk is increased due to her previous aortic dissection, prior surgery, and morbid obesity.  I reviewed the CTA images with her and her friend and answered her questions.  I discussed the operative procedure with the patient including alternatives, benefits and risks; including but not limited to  bleeding, blood transfusion, infection, stroke, myocardial infarction, graft failure, heart block requiring a permanent pacemaker, organ dysfunction, and death.  Alexis Wheeler understands and agrees to proceed.    Preparation:  The patient was seen in the preoperative holding area and the correct patient, correct operation were confirmed with the patient after reviewing the medical record and catheterization. The consent was signed by me. Preoperative antibiotics were given. A pulmonary arterial line and radial arterial line were placed by the anesthesia team. The patient was taken back to the operating room and positioned supine on the operating room table. After being placed under general endotracheal anesthesia by the anesthesia team a foley catheter was placed. The neck, chest, abdomen, and both legs were prepped with betadine soap and solution and draped in the usual sterile manner. A surgical time-out was taken and the correct patient and operative procedure were confirmed with the nursing and anesthesia staff.  TEE:  Performed by Dr. Gavin Potters. This showed severe AI with an aortic root aneurysm. Normal LV systolic function.   Cardiopulmonary Bypass:  A redo median sternotomy was performed using the oscillating saw without difficulty. Bone hooks were used to retract the sternum and the heart was dissected from the sternum using electrocautery. The brachiocephalic vein was lying just behind the top of the manubrium but was not very adherent and safely mobilized.  Dissection was performed to expose the right atrium and ascending aortic graft.  The aortic arch was exposed and the anterior wall felt soft. There were no contraindications to aortic cannulation here or cross-clamping. The patient was fully systemically heparinized and the ACT was  maintained > 400 sec. The proximal aortic arch was cannulated with a 21 F straight aortic cannula for arterial inflow. Venous cannulation was performed  via the low right atrial wall using a 40 F single-staged venous cannula. Aortic occlusion was performed proximal to the innominate artery with a single cross-clamp. Systemic cooling to 28 degrees Centigrade and topical cooling of the heart with iced saline were used. I could not get the retrograde cannula to engage the coronary sinus and after multiple attempts I decided to give cardioplegia directly into the coronary ostia. After applying the cross clamp the ascending aortic graft was transected proximally just distal to the old proximal anastomosis. The coronary ostia were identified and cold KBC cardioplegia was used to induce diastolic arrest and was then given into the coronary ostia using a handheld cannula at about 60 minute intervals throughout the period of arrest. CO2 was insufflated into the pericardium throughout the case to minimize intracardiac air.   Bentall Procedure:   The ascending aortic graft was mobilized from the right pulmonary artery and main PA. The valve was inspected. It was a trileaflet valve with lack of central coaptation and some prolapse due to the aortic root aneurysm.  The right and left coronary arteries were removed from the aortic root with a button of aortic wall around the ostia. The left coronary ostia was very large. They were retracted carefully out of the way with stay sutures to prevent rotation. The native valve was excised taking care to remove all particulate debri. The annulus was sized and a 23 mm Edwards KONECT RESILIA  pericardial valved conduit was chosen. ( Model # [id number], Serial # [id number]). A series of pledgetted 2-0 Ethibond horizontal mattress sutures were placed around the annulus with the pledgets in a sub-annular position. The sutures were placed through the valve sewing ring. The valve was lowered into place and the sutures tied . The valve seated nicely.  Small openings were made in the graft for the coronary anastomoses using a thermal cautery.  Then the left and right coronary buttons were anastomosed to the graft in an end to side manner using continuous 5-0 prolene suture. A light coating of CoSeal was applied to each anastomosis for hemostasis. The graft was then cut to the appropriate length and anastomosed end to end to the old ascending aortic graft using continuous 3-0 prolene suture. CoSeal was applied to seal the needle holes in the grafts. A vent cannula was placed into the graft to remove any air. Deairing maneuvers were performed and the bed placed in trendelenburg position.   Completion:   The patient was rewarmed to 37 degrees Centigrade. The crossclamp was removed with a time of 183 minutes. There was spontaneous return of sinus rhythm. The position of the grafts was satisfactory.  Two temporary epicardial pacing wires were placed on the right atrium and a bipolar ventricular wire on the inferior wall. The patient was weaned from CPB without difficulty on no inotropic agents.  CPB time was 304 minutes. Cardiac output was 5 LPM. TEE showed a normal functioning aortic valve prosthesis with no AI. LV function appeared normal. Heparin was fully reversed with protamine and the aortic and venous cannulas removed. The patient was coagulopathic with a platelet count of 68K, low fibrinogen. She was given FFP, plts and cryoprecipitate. Hemostasis was achieved. Mediastinal and left pleural drainage tubes were placed. The sternum was closed with double #6 stainless steel wires. The fascia was closed with continuous # 1 vicryl suture.  The subcutaneous tissue was closed with 2-0 vicryl continuous suture. The skin was closed with 3-0 vicryl subcuticular suture. All sponge, needle, and instrument counts were reported correct at the end of the case. Dry sterile dressings were placed over the incisions and around the chest tubes which were connected to pleurevac suction. The patient was then transported to the surgical intensive care unit in  stable  condition.

## 2022-12-15 NOTE — Brief Op Note (Signed)
12/15/2022  9:35 AM  PATIENT:  Alexis Wheeler  67 y.o. female  PRE-OPERATIVE DIAGNOSIS:  SEVERE AORTIC INSUFFICIENCY, AORTIC ROOT DILATION  POST-OPERATIVE DIAGNOSIS:  SEVERE AORTIC INSUFFICIENCY, AORTIC ROOT DILATION  PROCEDURE:  Procedure(s):  REDO STERNOTOMY (N/A)  BENTALL PROCEDURE  -23 mm Edwards Resilia Konect Valved Conduit -Reimplantation of Left and Right Coronary arteries   TRANSESOPHAGEAL ECHOCARDIOGRAM (TEE) (N/A)  SURGEON:  Surgeons and Role:    * [name], MD - Primary  PHYSICIAN ASSISTANT: Lowella Dandy PA-C  ASSISTANTS: Bufford Lope   ANESTHESIA:   general  EBL:  2694 mL   BLOOD ADMINISTERED: CC CELLSAVER, 2 FFP 2 Cryo, and 2 PLTS  DRAINS:  Mediastinal Chest Drains    LOCAL MEDICATIONS USED:  NONE  SPECIMEN:  Source of Specimen:  Aortic Valve Leaflets  DISPOSITION OF SPECIMEN:  PATHOLOGY  COUNTS:  YES  TOURNIQUET:  * No tourniquets in log *  DICTATION: .Dragon Dictation  PLAN OF CARE: Admit to inpatient   PATIENT DISPOSITION:  ICU - intubated and hemodynamically stable.   Delay start of Pharmacological VTE agent (>24hrs) due to surgical blood loss or risk of bleeding: yes

## 2022-12-15 NOTE — Interval H&P Note (Signed)
History and Physical Interval Note:  12/15/2022 6:32 AM  Alexis Wheeler  has presented today for surgery, with the diagnosis of SEVERE AI AORTIC ROOT DILATION.  The various methods of treatment have been discussed with the patient and family. After consideration of risks, benefits and other options for treatment, the patient has consented to  Procedure(s) with comments: REDO STERNOTOMY (N/A) BENTALL PROCEDURE (N/A) - POSSIBLE RIGHT AXILLARY CANNULATION TRANSESOPHAGEAL ECHOCARDIOGRAM (TEE) (N/A) as a surgical intervention.  The patient's history has been reviewed, patient examined, no change in status, stable for surgery.  I have reviewed the patient's chart and labs.  Questions were answered to the patient's satisfaction.     [name]

## 2022-12-15 NOTE — Progress Notes (Signed)
Pt placed on 40/4 per RWP.

## 2022-12-15 NOTE — Progress Notes (Signed)
Pt placed back to full support vent setting per ABG results.

## 2022-12-15 NOTE — Hospital Course (Addendum)
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 Her postop echo in July 2016 showed only trivial AI with an aortic root diameter of 42 mm and an EF of 55-60%. AI was mild in 03/2018 and 03/2019. AI was moderate in 03/2020 and 07/2020 with a pressure half time of 359 msec. Her most recent echo on 09/08/2022 showed eccentric moderate to severe AI with a PHT of 354 msec and a vena contracta of 1 cm but no diastolic flow reversal in the descending aorta. LV diastolic diameter was 4.85 cm. Aortic root diameter was measured at 4.5 cm.  She presented to the ER 08/13/2022 with chest heaviness. ECG and troponin were negative. Her pain was resolved with NSAIDs. She saw Dr. [name] and had an ETT that showed no ST changes but was inadequate due to poor exercise capacity. A TEE showed a dilated aortic root to 4.8 cm with lack of central coaptation of the aortic valve leaflets and moderate to severe AI with a VC of 0.49 cm2, regurgitant volume of 50 cc, AI ERO of 0.33 cm2 and AI PISA radius of 0.6 cm. LV and RV systolic function were normal. Cath showed no significant coronary artery disease with normal PA pressures and CI 3.2.  She was referred to [facility] and evaluated by Dr. Laneta Simmers at which time she admitted to some shortness of breath and fatigue with exertion. This occurs with walking up stairs or inclines and cleaning her house. She has not had any further chest discomfort, no dizziness, no LE swelling.  It was felt the patient would require Redo Sternotomy with Bentall procedure.   The risks and benefits of the procedure were explained to the patient and she was agreeable to proceed.  Hospital Course:  Alexis Wheeler presented to [facility] on 12/15/2022.  She was taken to the operating room and underwent Redo Sternotomy, Bentall Procedure with a 23 mm Edwards Resilia Konect Valved Conduit and Reimplantation of the left and right main coronary arteries.  She tolerated the procedure without difficulty and was taken to the SICU in stable condition.  The patient was extubated the evening of surgery.  The patient required Nor-epinephrine post operatively which was successfully weaned overnight.  She was bradycardic and required AV pacing and was not started on a beta blocker.  Her swan and arterial line.  She was started on Lasix to help facilitate diuresis.  She has a history of DVT and will be treated with Lovenox initially.  She will transition to Eliquis prior to discharge.  She developed an elevation in her creatinine level due diuretics thus these were held.  She developed post operative thrombocytopenia and Lovenox was held.  She has remained in NSR.  Her heart rate has improved and her pacing wires were removed on 12/18/2022.  She was stated on low dose lopressor with plans to transition back to home Coreg once BP improves.  She was evaluated by PT who recommended CIR placement.

## 2022-12-15 NOTE — Anesthesia Procedure Notes (Signed)
Arterial Line Insertion Start/End12/03/2022 7:00 AM, 12/15/2022 7:10 AM Performed by: Alease Medina, CRNA, CRNA  Patient location: Pre-op. Preanesthetic checklist: patient identified, IV checked, site marked, risks and benefits discussed, surgical consent, monitors and equipment checked, pre-op evaluation, timeout performed and anesthesia consent Lidocaine 1% used for infiltration and patient sedated Left, radial was placed Catheter size: 20 G Hand hygiene performed  and maximum sterile barriers used  Allen's test indicative of satisfactory collateral circulation Attempts: 2 Procedure performed using ultrasound guided technique. Ultrasound Notes:anatomy identified, needle tip was noted to be adjacent to the nerve/plexus identified and no ultrasound evidence of intravascular and/or intraneural injection Following insertion, dressing applied and Biopatch. Post procedure assessment: normal and unchanged  Patient tolerated the procedure well with no immediate complications. Additional procedure comments: Performed by SRNA with supervision by CRNA.

## 2022-12-15 NOTE — Anesthesia Procedure Notes (Signed)
Procedure Name: Intubation Date/Time: 12/15/2022 7:49 AM  Performed by: Alease Medina, CRNAPre-anesthesia Checklist: Patient identified, Emergency Drugs available, Suction available and Patient being monitored Patient Re-evaluated:Patient Re-evaluated prior to induction Oxygen Delivery Method: Circle system utilized Preoxygenation: Pre-oxygenation with 100% oxygen Induction Type: IV induction Ventilation: Mask ventilation without difficulty and Oral airway inserted - appropriate to patient size Laryngoscope Size: Mac and 3 Grade View: Grade I Tube type: Oral Tube size: 8.0 mm Number of attempts: 1 Airway Equipment and Method: Stylet and Oral airway Placement Confirmation: ETT inserted through vocal cords under direct vision, positive ETCO2 and breath sounds checked- equal and bilateral Secured at: 22 cm Tube secured with: Tape Dental Injury: Teeth and Oropharynx as per pre-operative assessment  Comments: Performed by SRNA with supervision from CRNA and MDA

## 2022-12-16 ENCOUNTER — Encounter (HOSPITAL_COMMUNITY): Payer: Self-pay | Admitting: Surgery

## 2022-12-16 ENCOUNTER — Inpatient Hospital Stay (HOSPITAL_COMMUNITY): Payer: Medicare Other

## 2022-12-16 LAB — BASIC METABOLIC PANEL
Anion gap: 6 (ref 5–15)
Anion gap: 6 (ref 5–15)
Anion gap: 8 (ref 5–15)
BUN: 8 mg/dL (ref 8–23)
BUN: 9 mg/dL (ref 8–23)
BUN: 9 mg/dL (ref 8–23)
CO2: 22 mmol/L (ref 22–32)
CO2: 24 mmol/L (ref 22–32)
CO2: 28 mmol/L (ref 22–32)
Calcium: 7.6 mg/dL — ABNORMAL LOW (ref 8.9–10.3)
Calcium: 7.7 mg/dL — ABNORMAL LOW (ref 8.9–10.3)
Calcium: 7.7 mg/dL — ABNORMAL LOW (ref 8.9–10.3)
Chloride: 108 mmol/L (ref 98–111)
Chloride: 113 mmol/L — ABNORMAL HIGH (ref 98–111)
Chloride: 113 mmol/L — ABNORMAL HIGH (ref 98–111)
Creatinine, Ser: 1.21 mg/dL — ABNORMAL HIGH (ref 0.44–1.00)
Creatinine, Ser: 1.24 mg/dL — ABNORMAL HIGH (ref 0.44–1.00)
Creatinine, Ser: 1.25 mg/dL — ABNORMAL HIGH (ref 0.44–1.00)
GFR, Estimated: 47 mL/min — ABNORMAL LOW (ref >60)
GFR, Estimated: 47 mL/min — ABNORMAL LOW (ref >60)
GFR, Estimated: 49 mL/min — ABNORMAL LOW (ref >60)
Glucose, Bld: 115 mg/dL — ABNORMAL HIGH (ref 70–99)
Glucose, Bld: 137 mg/dL — ABNORMAL HIGH (ref 70–99)
Glucose, Bld: 155 mg/dL — ABNORMAL HIGH (ref 70–99)
Potassium: 3.5 mmol/L (ref 3.5–5.1)
Potassium: 3.8 mmol/L (ref 3.5–5.1)
Potassium: 4.4 mmol/L (ref 3.5–5.1)
Sodium: 142 mmol/L (ref 135–145)
Sodium: 143 mmol/L (ref 135–145)
Sodium: 143 mmol/L (ref 135–145)

## 2022-12-16 LAB — CBC
HCT: 32.9 % — ABNORMAL LOW (ref 36.0–46.0)
HCT: 35 % — ABNORMAL LOW (ref 36.0–46.0)
HCT: 35.5 % — ABNORMAL LOW (ref 36.0–46.0)
Hemoglobin: 10.4 g/dL — ABNORMAL LOW (ref 12.0–15.0)
Hemoglobin: 11.4 g/dL — ABNORMAL LOW (ref 12.0–15.0)
Hemoglobin: 11.5 g/dL — ABNORMAL LOW (ref 12.0–15.0)
MCH: 24.5 pg — ABNORMAL LOW (ref 26.0–34.0)
MCH: 24.5 pg — ABNORMAL LOW (ref 26.0–34.0)
MCH: 25.5 pg — ABNORMAL LOW (ref 26.0–34.0)
MCHC: 31.6 g/dL (ref 30.0–36.0)
MCHC: 32.1 g/dL (ref 30.0–36.0)
MCHC: 32.9 g/dL (ref 30.0–36.0)
MCV: 76.3 fL — ABNORMAL LOW (ref 80.0–100.0)
MCV: 77.4 fL — ABNORMAL LOW (ref 80.0–100.0)
MCV: 77.6 fL — ABNORMAL LOW (ref 80.0–100.0)
Platelets: 114 10*3/uL — ABNORMAL LOW (ref 150–400)
Platelets: 117 10*3/uL — ABNORMAL LOW (ref 150–400)
Platelets: 87 10*3/uL — ABNORMAL LOW (ref 150–400)
RBC: 4.25 MIL/uL (ref 3.87–5.11)
RBC: 4.51 MIL/uL (ref 3.87–5.11)
RBC: 4.65 MIL/uL (ref 3.87–5.11)
RDW: 16.7 % — ABNORMAL HIGH (ref 11.5–15.5)
RDW: 17 % — ABNORMAL HIGH (ref 11.5–15.5)
RDW: 17.5 % — ABNORMAL HIGH (ref 11.5–15.5)
WBC: 14.9 10*3/uL — ABNORMAL HIGH (ref 4.0–10.5)
WBC: 15.2 10*3/uL — ABNORMAL HIGH (ref 4.0–10.5)
WBC: 15.7 10*3/uL — ABNORMAL HIGH (ref 4.0–10.5)
nRBC: 0 % (ref 0.0–0.2)
nRBC: 0 % (ref 0.0–0.2)
nRBC: 0 % (ref 0.0–0.2)

## 2022-12-16 LAB — PREPARE FRESH FROZEN PLASMA
Unit division: 0
Unit division: 0

## 2022-12-16 LAB — BPAM FFP
Blood Product Expiration Date: 202412042359
Blood Product Expiration Date: 202412062359
ISSUE DATE / TIME: 202412031413
ISSUE DATE / TIME: 202412031413
Unit Type and Rh: 6200
Unit Type and Rh: 6200

## 2022-12-16 LAB — BPAM PLATELET PHERESIS
Blood Product Expiration Date: 202412042359
Blood Product Expiration Date: 202412052359
ISSUE DATE / TIME: 202412031428
ISSUE DATE / TIME: 202412031428
Unit Type and Rh: 5100
Unit Type and Rh: 6200

## 2022-12-16 LAB — GLUCOSE, CAPILLARY
Glucose-Capillary: 110 mg/dL — ABNORMAL HIGH (ref 70–99)
Glucose-Capillary: 110 mg/dL — ABNORMAL HIGH (ref 70–99)
Glucose-Capillary: 110 mg/dL — ABNORMAL HIGH (ref 70–99)
Glucose-Capillary: 113 mg/dL — ABNORMAL HIGH (ref 70–99)
Glucose-Capillary: 114 mg/dL — ABNORMAL HIGH (ref 70–99)
Glucose-Capillary: 116 mg/dL — ABNORMAL HIGH (ref 70–99)
Glucose-Capillary: 117 mg/dL — ABNORMAL HIGH (ref 70–99)
Glucose-Capillary: 119 mg/dL — ABNORMAL HIGH (ref 70–99)
Glucose-Capillary: 121 mg/dL — ABNORMAL HIGH (ref 70–99)
Glucose-Capillary: 122 mg/dL — ABNORMAL HIGH (ref 70–99)
Glucose-Capillary: 124 mg/dL — ABNORMAL HIGH (ref 70–99)
Glucose-Capillary: 141 mg/dL — ABNORMAL HIGH (ref 70–99)
Glucose-Capillary: 147 mg/dL — ABNORMAL HIGH (ref 70–99)
Glucose-Capillary: 154 mg/dL — ABNORMAL HIGH (ref 70–99)

## 2022-12-16 LAB — POCT I-STAT 7, (LYTES, BLD GAS, ICA,H+H)
Acid-base deficit: 3 mmol/L — ABNORMAL HIGH (ref 0.0–2.0)
Acid-base deficit: 3 mmol/L — ABNORMAL HIGH (ref 0.0–2.0)
Acid-base deficit: 2 mmol/L (ref 0.0–2.0)
Acid-base deficit: 2 mmol/L (ref 0.0–2.0)
Acid-base deficit: 2 mmol/L (ref 0.0–2.0)
Bicarbonate: 22.8 mmol/L (ref 20.0–28.0)
Bicarbonate: 23.9 mmol/L (ref 20.0–28.0)
Bicarbonate: 24.5 mmol/L (ref 20.0–28.0)
Bicarbonate: 24.5 mmol/L (ref 20.0–28.0)
Bicarbonate: 25.3 mmol/L (ref 20.0–28.0)
Calcium, Ion: 1.05 mmol/L — ABNORMAL LOW (ref 1.15–1.40)
Calcium, Ion: 1.24 mmol/L (ref 1.15–1.40)
Calcium, Ion: 1.21 mmol/L (ref 1.15–1.40)
Calcium, Ion: 1.24 mmol/L (ref 1.15–1.40)
Calcium, Ion: 1.17 mmol/L (ref 1.15–1.40)
HCT: 32 % — ABNORMAL LOW (ref 36.0–46.0)
HCT: 35 % — ABNORMAL LOW (ref 36.0–46.0)
HCT: 34 % — ABNORMAL LOW (ref 36.0–46.0)
HCT: 33 % — ABNORMAL LOW (ref 36.0–46.0)
HCT: 32 % — ABNORMAL LOW (ref 36.0–46.0)
Hemoglobin: 10.9 g/dL — ABNORMAL LOW (ref 12.0–15.0)
Hemoglobin: 11.9 g/dL — ABNORMAL LOW (ref 12.0–15.0)
Hemoglobin: 11.6 g/dL — ABNORMAL LOW (ref 12.0–15.0)
Hemoglobin: 11.2 g/dL — ABNORMAL LOW (ref 12.0–15.0)
Hemoglobin: 10.9 g/dL — ABNORMAL LOW (ref 12.0–15.0)
O2 Saturation: 94 %
O2 Saturation: 96 %
O2 Saturation: 97 %
O2 Saturation: 96 %
O2 Saturation: 93 %
Patient temperature: 35.7
Patient temperature: 36.4
Patient temperature: 36.7
Patient temperature: 36.7
Patient temperature: 36.4
Potassium: 2.8 mmol/L — ABNORMAL LOW (ref 3.5–5.1)
Potassium: 3.9 mmol/L (ref 3.5–5.1)
Potassium: 3.8 mmol/L (ref 3.5–5.1)
Potassium: 3.7 mmol/L (ref 3.5–5.1)
Potassium: 3.7 mmol/L (ref 3.5–5.1)
Sodium: 146 mmol/L — ABNORMAL HIGH (ref 135–145)
Sodium: 144 mmol/L (ref 135–145)
Sodium: 145 mmol/L (ref 135–145)
Sodium: 144 mmol/L (ref 135–145)
Sodium: 145 mmol/L (ref 135–145)
TCO2: 24 mmol/L (ref 22–32)
TCO2: 25 mmol/L (ref 22–32)
TCO2: 26 mmol/L (ref 22–32)
TCO2: 26 mmol/L (ref 22–32)
TCO2: 27 mmol/L (ref 22–32)
pCO2 arterial: 39.6 mm[Hg] (ref 32–48)
pCO2 arterial: 50.8 mm[Hg] — ABNORMAL HIGH (ref 32–48)
pCO2 arterial: 48.9 mm[Hg] — ABNORMAL HIGH (ref 32–48)
pCO2 arterial: 50.5 mm[Hg] — ABNORMAL HIGH (ref 32–48)
pCO2 arterial: 50.4 mm[Hg] — ABNORMAL HIGH (ref 32–48)
pH, Arterial: 7.362 (ref 7.35–7.45)
pH, Arterial: 7.277 — ABNORMAL LOW (ref 7.35–7.45)
pH, Arterial: 7.306 — ABNORMAL LOW (ref 7.35–7.45)
pH, Arterial: 7.293 — ABNORMAL LOW (ref 7.35–7.45)
pH, Arterial: 7.306 — ABNORMAL LOW (ref 7.35–7.45)
pO2, Arterial: 70 mm[Hg] — ABNORMAL LOW (ref 83–108)
pO2, Arterial: 89 mm[Hg] (ref 83–108)
pO2, Arterial: 95 mm[Hg] (ref 83–108)
pO2, Arterial: 91 mm[Hg] (ref 83–108)
pO2, Arterial: 71 mm[Hg] — ABNORMAL LOW (ref 83–108)

## 2022-12-16 LAB — PREPARE CRYOPRECIPITATE
Unit division: 0
Unit division: 0

## 2022-12-16 LAB — PREPARE PLATELET PHERESIS
Unit division: 0
Unit division: 0

## 2022-12-16 LAB — BPAM CRYOPRECIPITATE
Blood Product Expiration Date: 202412042359
Blood Product Expiration Date: 202412082359
ISSUE DATE / TIME: 202412031414
ISSUE DATE / TIME: 202412031440
Unit Type and Rh: 5100
Unit Type and Rh: 6200

## 2022-12-16 LAB — MAGNESIUM
Magnesium: 2.3 mg/dL (ref 1.7–2.4)
Magnesium: 2.9 mg/dL — ABNORMAL HIGH (ref 1.7–2.4)
Magnesium: 3.2 mg/dL — ABNORMAL HIGH (ref 1.7–2.4)

## 2022-12-16 LAB — SURGICAL PATHOLOGY

## 2022-12-16 MED ORDER — INSULIN ASPART 100 UNIT/ML IJ SOLN: 0.0000 [IU] | INTRAMUSCULAR | Status: DC

## 2022-12-16 MED ORDER — FUROSEMIDE 10 MG/ML IJ SOLN
40.0000 mg | Freq: Two times a day (BID) | INTRAMUSCULAR | Status: AC
  Administered 2022-12-16 (×2): 40 mg via INTRAVENOUS
  Filled 2022-12-16 (×2): qty 4

## 2022-12-16 MED ORDER — ASPIRIN 325 MG PO TABS
325.0000 mg | ORAL_TABLET | Freq: Every day | ORAL | Status: DC
  Administered 2022-12-16: 325 mg via ORAL
  Filled 2022-12-16: qty 1

## 2022-12-16 MED ORDER — SODIUM BICARBONATE 8.4 % IV SOLN
50.0000 meq | Freq: Once | INTRAVENOUS | Status: AC
  Administered 2022-12-16: 50 meq via INTRAVENOUS

## 2022-12-16 MED ORDER — INSULIN DETEMIR 100 UNIT/ML ~~LOC~~ SOLN
10.0000 [IU] | Freq: Once | SUBCUTANEOUS | Status: AC
  Administered 2022-12-16: 10 [IU] via SUBCUTANEOUS
  Filled 2022-12-16: qty 0.1

## 2022-12-16 MED ORDER — POTASSIUM CHLORIDE 10 MEQ/50ML IV SOLN
10.0000 meq | INTRAVENOUS | Status: AC
  Administered 2022-12-16 (×3): 10 meq via INTRAVENOUS
  Filled 2022-12-16 (×3): qty 50

## 2022-12-16 MED ORDER — INSULIN DETEMIR 100 UNIT/ML ~~LOC~~ SOLN
10.0000 [IU] | Freq: Once | SUBCUTANEOUS | Status: DC
  Filled 2022-12-16: qty 0.1

## 2022-12-16 MED ORDER — ORAL CARE MOUTH RINSE
15.0000 mL | Freq: Three times a day (TID) | OROMUCOSAL | Status: DC
  Administered 2022-12-17 – 2022-12-21 (×10): 15 mL via OROMUCOSAL

## 2022-12-16 MED ORDER — ENOXAPARIN SODIUM 40 MG/0.4ML IJ SOSY
40.0000 mg | PREFILLED_SYRINGE | Freq: Every day | INTRAMUSCULAR | Status: DC
  Administered 2022-12-16: 40 mg via SUBCUTANEOUS
  Filled 2022-12-16: qty 0.4

## 2022-12-16 NOTE — Progress Notes (Signed)
Pt flipped to 40/4 per RWP by RT

## 2022-12-16 NOTE — Procedures (Signed)
Extubation Procedure Note  Patient Details:   Name: Alexis Wheeler DOB: October 18, 1955 MRN: [id number]   Airway Documentation:    Vent end date: 12/16/22 Vent end time: 0540   Evaluation  O2 sats: stable throughout Complications: No apparent complications Patient did tolerate procedure well. Bilateral Breath Sounds: Clear, Diminished   Yes  Pt extubated per protocol. NIF: -30 VC.6L IS: . No stridor noted. Positive cuff leak. Pt able to say her name. Pt tolerated well.  Berton Bon 12/16/2022, 5:42 AM

## 2022-12-16 NOTE — Progress Notes (Signed)
1 Day Post-Op Procedure(s) (LRB): REDO STERNOTOMY (N/A) BENTALL PROCEDURE USING 23 MM KONECT RESILIA AORTIC VALVED CONDUIT (N/A) TRANSESOPHAGEAL ECHOCARDIOGRAM (TEE) (N/A) Subjective: Extubated this am. No complaints.  Objective: Vital signs in last 24 hours: Temp:  [95.9 F (35.5 C)-98.1 F (36.7 C)] 97.3 F (36.3 C) (12/04 0700) Pulse Rate:  [57-90] 82 (12/04 0700) Cardiac Rhythm: A-V Sequential paced (12/04 0000) Resp:  [8-26] 14 (12/04 0700) BP: (53-120)/(25-68) 53/25 (12/03 1915) SpO2:  [91 %-100 %] 98 % (12/04 0700) Arterial Line BP: (82-146)/(50-71) 107/54 (12/04 0700) FiO2 (%):  [40 %-50 %] 40 % (12/04 0444) Weight:  [98.7 kg] 98.7 kg (12/04 0500)  Hemodynamic parameters for last 24 hours: PAP: (12-34)/(6-18) 13/6 CO:  [1.7 L/min] 1.7 L/min CI:  [0.86 L/min/m2-0.9 L/min/m2] 0.86 L/min/m2  Intake/Output from previous day: 12/03 0701 - 12/04 0700 In: 6259.2 [P.O.:100; I.V.:2368.5; ZOXWR:6045; NG/GT:50; IV Piggyback:1244.7] Out: 6157 [Urine:3115; WUJWJ:1914; Chest Tube:348] Intake/Output this shift: No intake/output data recorded.  General appearance: still sleepy but follows commands. Neurologic: intact Heart: regular rate and rhythm, no murmur Lungs: clear to auscultation bilaterally Extremities: edema mild Wound: dressing with minimal drainage from early postop.  Lab Results: Recent Labs    12/15/22 2344 12/16/22 0203 12/16/22 0424 12/16/22 0507 12/16/22 0645  WBC 14.9*  --  15.2*  --   --   HGB 11.5*   < > 11.4* 11.2* 10.9*  HCT 35.0*   < > 35.5* 33.0* 32.0*  PLT 117*  --  114*  --   --    < > = values in this interval not displayed.   BMET:  Recent Labs    12/15/22 2344 12/16/22 0203 12/16/22 0424 12/16/22 0507 12/16/22 0645  NA 143   < > 143 144 145  K 4.4   < > 3.8 3.7 3.7  CL 113*  --  113*  --   --   CO2 24  --  22  --   --   GLUCOSE 155*  --  115*  --   --   BUN 8  --  9  --   --   CREATININE 1.25*  --  1.21*  --   --   CALCIUM 7.7*   --  7.7*  --   --    < > = values in this interval not displayed.    PT/INR:  Recent Labs    12/15/22 1718  LABPROT 20.2*  INR 1.7*   ABG    Component Value Date/Time   PHART 7.306 (L) 12/16/2022 0645   HCO3 25.3 12/16/2022 0645   TCO2 27 12/16/2022 0645   ACIDBASEDEF 2.0 12/16/2022 0645   O2SAT 93 12/16/2022 0645   CBG (last 3)  Recent Labs    12/16/22 0307 12/16/22 0419 12/16/22 0643  GLUCAP 116* 114* 141*   CXR: mild left lung atelectasis.  ECG: pending.  Assessment/Plan: S/P Procedure(s) (LRB): REDO STERNOTOMY (N/A) BENTALL PROCEDURE USING 23 MM KONECT RESILIA AORTIC VALVED CONDUIT (N/A) TRANSESOPHAGEAL ECHOCARDIOGRAM (TEE) (N/A)  POD 1 Hemodynamically stable overnight and off NE this am with SBP 100's. Rhythm under pacer is sinus brady 40's with some dropped p-waves. Will continue AV pacing for now. Hold off on beta blocker.  Keep chest tubes today.  DC swan, arterial line.  Start diuresis.  Glucose under good control. Transition to SSI and give small dose of Levemir this am. Preop Hgb A1c 5.9 on no meds.  IS, OOB.  She has hx of DVT, large PE with saddle  embolus and acute Cor Pulmonale in 03/2020. Continue SCD's, Lovenox and plan to resume Eliquis prior to discharge.   LOS: 1 day    Alexis Wheeler 12/16/2022

## 2022-12-16 NOTE — Progress Notes (Signed)
Rapid wean initiated per protocol  

## 2022-12-16 NOTE — Progress Notes (Signed)
Wean ABG slightly acidotic, CO2 elevated.   ABG    Component Value Date/Time   PHART 7.293 (L) 12/16/2022 0507   PCO2ART 50.5 (H) 12/16/2022 0507   PO2ART 91 12/16/2022 0507   HCO3 24.5 12/16/2022 0507   TCO2 26 12/16/2022 0507   ACIDBASEDEF 2.0 12/16/2022 0507   O2SAT 96 12/16/2022 0507   Dr. Laneta Simmers notified, given order for 1 amp bicarb and to extubate. RT notified.

## 2022-12-16 NOTE — Discharge Summary (Signed)
[address]       [place] [address]             [phone]    Physician Discharge Summary  Patient ID: Alexis Wheeler MRN: [id number] DOB/AGE: 1955/02/11 67 y.o.  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   Plantar fasciitis of left foot 05/02/2014   Non-compliant behavior 03/27/2014   S/P aortic dissection repair 03/03/2014   Lumbosacral spondylosis without myelopathy 01/26/2014   Greater trochanteric bursitis of right hip 10/10/2013   Chronic low back pain 09/26/2013   Chronic right hip pain 09/26/2013   Allergic rhinitis 08/29/2012   Kidney stone 12/31/2010   Morbid obesity (HCC) 12/18/2009   GERD 12/18/2009   HEMATURIA UNSPECIFIED 12/17/2009   Hyperlipidemia with target LDL less than 70 12/16/2009   Hypertensive heart disease 12/16/2009   Discharge Diagnoses:  Patient Active Problem List   Diagnosis Date Noted   S/P Bentall Procedure with 23 mm Edwards Resilia Konect Valved Conduit 12/15/2022   Aortic insufficiency 07/25/2020   Acute pulmonary embolism (HCC) 04/01/2020   Saddle embolus of pulmonary artery (HCC)    Dyspnea    Irritable bowel syndrome 05/23/2019   Ascending aortic dissection (HCC)    OSA on CPAP 05/10/2017   Benign essential HTN 05/10/2017   Coronary artery disease 03/08/2016   Diastolic dysfunction without  heart failure 03/08/2016   Chest pain 03/07/2016   Fatigue 04/30/2015   Hypokalemia 12/20/2014   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   Plantar fasciitis of left foot 05/02/2014   Non-compliant behavior 03/27/2014   S/P aortic dissection repair 03/03/2014   Lumbosacral spondylosis without myelopathy 01/26/2014   Greater trochanteric bursitis of right hip 10/10/2013   Chronic low back pain 09/26/2013   Chronic right hip pain 09/26/2013   Allergic rhinitis 08/29/2012   Kidney stone 12/31/2010   Morbid obesity (HCC) 12/18/2009   GERD 12/18/2009   HEMATURIA UNSPECIFIED 12/17/2009   Hyperlipidemia with target LDL less than 70 12/16/2009   Hypertensive heart disease Expected acute blood loss anemia Thrombocytopenia 12/16/2009   Discharged Condition: stable  History of Present Illness:  Alexis Wheeler is a 67 yo obese AA female with a history of hypertension, hyperlipidemia, obesity, OSA on CPAP, congestive dilated cardiomyopathy, DVT/PE with saddle embolism and acute cor pulmonale in 03/2020, and acute type 1 aortic dissection with severe AI in 02/2014 who underwent repair with a 30 mm Hemashield tube graft using deep hypothermic circulatory arrest via the right axillary artery and resuspension repair of the aortic valve. Her postop echo in July 2016 showed only trivial AI with an aortic root diameter of 42 mm and an EF of 55-60%. AI was mild in 03/2018 and 03/2019. AI was moderate in 03/2020 and 07/2020 with a pressure half time of 359 msec. Her most recent echo on 09/08/2022 showed eccentric moderate  to severe AI with a PHT of 354 msec and a vena contracta of 1 cm but no diastolic flow reversal in the descending aorta. LV diastolic diameter was 4.85 cm. Aortic root diameter was measured at 4.5 cm.  She presented to the ER 08/13/2022 with chest heaviness. ECG and troponin were negative. Her pain was resolved with NSAIDs. She saw Dr. [name] and  had an ETT that showed no ST changes but was inadequate due to poor exercise capacity. A TEE showed a dilated aortic root to 4.8 cm with lack of central coaptation of the aortic valve leaflets and moderate to severe AI with a VC of 0.49 cm2, regurgitant volume of 50 cc, AI ERO of 0.33 cm2 and AI PISA radius of 0.6 cm. LV and RV systolic function were normal. Cath showed no significant coronary artery disease with normal PA pressures and CI 3.2.  She was referred to [facility] and evaluated by Dr. Laneta Simmers at which time she admitted to some shortness of breath and fatigue with exertion. This occurs with walking up stairs or inclines and cleaning her house. She has not had any further chest discomfort, no dizziness, no LE swelling.  It was felt the patient would require Redo Sternotomy with Bentall procedure.  The risks and benefits of the procedure were explained to the patient and she was agreeable to proceed.  Hospital Course:  Alexis Wheeler presented to [facility] on 12/15/2022.  She was taken to the operating room and underwent Redo Sternotomy, Bentall Procedure with a 23 mm Edwards Resilia Konect Valved Conduit and Reimplantation of the left and right main coronary arteries.  She tolerated the procedure without difficulty and was taken to the SICU in stable condition.  The patient was extubated the evening of surgery.  The patient required Nor-epinephrine post operatively which was successfully weaned overnight.  She was bradycardic and required AV pacing and was not started on a beta blocker.  Her swan and arterial line.  She was started on Lasix to help facilitate diuresis.  She has a history of DVT and will be treated with Lovenox initially.  She was transitioned to Eliquis prior to discharge and also started back on her usual antihypertension medications. She regained independence with mobility and transfers and tolerated advancement of her diet. She was ready for  discharge to home on post-op day 7. We arranged for a rolling walker for home use and also for home health physical therapy to assist with further strengthening.   Consults: None  Significant Diagnostic Studies:   FINDINGS: CTA CHEST FINDINGS   Cardiovascular: Bulbous aneurysmal dilatation of the aortic root measuring up to 5.1 cm at the sinuses of Valsalva (see sagittal reformatted image 127 of series 8). Surgical changes of prior tube graft repair of the ascending thoracic aorta. No evidence of complication at the proximal or distal anastomosis. Residual type 2 thoracic aortic dissection extending into the proximal brachiocephalic artery. No involvement of the carotid or left subclavian arteries. The dissection extends through the arch and descending thoracic aorta and into the abdomen. Mild aneurysmal dilation of the proximal descending thoracic aorta to a maximum of 4 cm.   The main pulmonary artery is normal in caliber. The heart is normal in size. No pericardial effusion.   Mediastinum/Nodes: Scattered tiny thyroid cysts/nodules. None meet size criteria to warrant further evaluation. No imaging follow-up is recommended. No mediastinal mass or suspicious adenopathy. Unremarkable esophagus.   Lungs/Pleura: Lungs are clear. No pleural effusion  or pneumothorax.   Musculoskeletal: No acute fracture or aggressive appearing lytic or blastic osseous lesion. Healed median sternotomy.   Review of the MIP images confirms the above findings.   CTA ABDOMEN AND PELVIS FINDINGS   VASCULAR   Aorta: Thoracoabdominal aortic dissection extends through the aorta into the distal infrarenal segment terminating just proximal to the bifurcation. Mild scattered plaque. Minimal aneurysmal dilation of the visceral abdominal aorta with a maximal diameter of 3.2 cm.   Celiac: Arises from the true lumen. No dissection or aneurysm. Conventional hepatic arterial anatomy.   SMA: Arises from the  true lumen.  Widely patent.   Renals: Single right renal artery arises from the true lumen. No involvement by the dissection flap. No aneurysm. On the left, there are 2 renal arteries. A smaller accessory renal artery arises from the true lumen and provides the majority of flow to the renal parenchyma. The upper pole renal artery arises from the false lumen and is poorly opacified.   IMA: Arises from the true lumen.  Atretic but patent.   Inflow: Patent without evidence of aneurysm, dissection, vasculitis or significant stenosis.   Veins: No focal venous abnormality.   Review of the MIP images confirms the above findings.   NON-VASCULAR   Hepatobiliary: No focal liver abnormality is seen. Status post cholecystectomy. No biliary dilatation.   Pancreas: Unremarkable. No pancreatic ductal dilatation or surrounding inflammatory changes.   Spleen: Normal in size without focal abnormality.   Adrenals/Urinary Tract: Stable left adrenal adenoma at approximately 1.8 cm. The right adrenal gland is normal. No hydronephrosis, no hydronephrosis or enhancing renal mass. Punctate nonobstructing stone in the upper pole collecting system of the left kidney. Diffuse atrophy of the upper half of the left kidney due to hypoperfusion from the left main renal artery arising from the false lumen. The ureters and bladder are unremarkable.   Stomach/Bowel: No focal bowel wall thickening or evidence of obstruction.   Lymphatic: No suspicious lymphadenopathy.   Reproductive: Status post hysterectomy. No adnexal masses.   Other: No abdominal wall hernia or abnormality. No abdominopelvic ascites.   Musculoskeletal: No acute fracture or aggressive appearing lytic or blastic osseous lesion. Mild grade 1 anterolisthesis of L4 on L5.   Review of the MIP images confirms the above findings.   IMPRESSION: VASCULAR   1. Bulbous aneurysmal dilation of the aortic root measuring up to 5.1 cm at the  sinuses of Valsalva. 2. Prior open tube graft repair of the ascending thoracic aorta without evidence of complication at the proximal or distal anastomosis. 3. Residual Stanford type B thoracoabdominal dissection extending into the proximal right brachiocephalic artery and resulting in mild aneurysmal dilation of the proximal descending thoracic aorta to a maximum of 4 cm and the proximal abdominal aorta to 3.1 cm. Societal recommendations do not apply to aneurysmal dilation of chronic dissection. 4. The left main renal artery arises from the false lumen and is significantly hypoperfused. A smaller accessory left renal artery provides supply to the lower pole. 5. Access vessels are intact and unremarkable bilaterally.   NON VASCULAR   1. No acute cardiopulmonary process. 2. No acute abnormality within the abdomen or pelvis. 3. 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 4. Mild grade 1 anterolisthesis of L4 on L5. 5. Additional ancillary findings as above without significant interval change.   Signed,   Sterling Big, MD, RPVI   [facility]   [facility]  Electronically Signed   By: Malachy Moan M.D.   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   Biological Bentall Procedure using a 23 mm Edwards KONECT RESILIA pericardial valved conduit. 4.   Reimplantation of right and left coronary artery into conduit.  Discharge Exam: Blood pressure (!) 141/75, pulse 79, temperature 98.7 F (37.1 C), temperature source Oral, resp. rate 18, height 5\' 3"  (1.6 m), weight 89 kg, SpO2  100%.  General appearance: alert, cooperative, and no distress Heart: regular rate and rhythm Lungs: clear to auscultation bilaterally Abdomen: soft, no tenderness Extremities: no peripheral edema Wound: incisions intact and dry.    Discharge Medications:  The patient has been discharged on:   1.Beta Blocker:  Yes [  x ]                              No   [   ]                              If No, reason:  2.Ace Inhibitor/ARB: Yes [ x  ]                                     No  [    ]                                     If No, reason:  3.Statin:   Yes [  x ]                  No  [   ]                  If No, reason:  4.Ecasa:  Yes  [ x  ]                  No   [   ]                  If No, reason:  Patient had ACS upon admission:  No  Plavix/P2Y12 inhibitor: Yes [   ]                                      No  [  x ]     Discharge Instructions     Amb Referral to Cardiac Rehabilitation   Complete by: As directed    Diagnosis: Valve Replacement   Valve: Aortic   After initial evaluation and assessments completed: Virtual Based Care may be provided alone or in conjunction with Phase 2 Cardiac Rehab based on patient barriers.: Yes   Intensive Cardiac Rehabilitation (ICR) [facility] location only OR Traditional Cardiac Rehabilitation (TCR) *If criteria for ICR are not met will enroll in TCR [facility] only): Yes      Allergies as  of 12/22/2022       Reactions   Hydralazine Nausea Only   Motrin [ibuprofen] Nausea Only   Upset GI (motrin brand causes the side effect)        Medication List     STOP taking these medications    potassium chloride SA 20 MEQ tablet Commonly known as: KLOR-CON M       TAKE these medications    acetaminophen 500 MG tablet Commonly known as: TYLENOL Take 2 tablets (1,000 mg total) by mouth every 6 (six) hours as needed.   amLODipine 10 MG tablet Commonly known as: NORVASC Take 1 tablet (10 mg total) by mouth daily.   aspirin EC 81 MG  tablet Take 81 mg by mouth daily. Swallow whole.   atorvastatin 40 MG tablet Commonly known as: LIPITOR TAKE 1 TABLET BY MOUTH ONCE DAILY AT 6:00 IN THE  EVENING   carvedilol 12.5 MG tablet Commonly known as: COREG Take 1 tablet (12.5 mg total) by mouth 2 (two) times daily.   colesevelam 625 MG tablet Commonly known as: WELCHOL Take 1 tablet (625 mg total) by mouth every other day. As needed   diclofenac Sodium 1 % Gel Commonly known as: VOLTAREN Apply 2-4 g topically 4 (four) times daily as needed (pain).   Eliquis 5 MG Tabs tablet Generic drug: apixaban Take 1 tablet by mouth 2 (two) times daily.   folic acid 1 MG tablet Commonly known as: FOLVITE Take 1 mg by mouth daily.   loratadine 10 MG tablet Commonly known as: CLARITIN Take 1 tablet (10 mg total) by mouth daily. What changed:  when to take this reasons to take this   losartan 100 MG tablet Commonly known as: COZAAR Take 100 mg by mouth daily.   traMADol 50 MG tablet Commonly known as: ULTRAM Take 1 tablet (50 mg total) by mouth every 6 (six) hours as needed for up to 7 days for moderate pain (pain score 4-6) or severe pain (pain score 7-10).               Durable Medical Equipment  (From admission, onward)           Start     Ordered   12/22/22 0858  For home use only DME Walker rolling  Once       Question Answer Comment  Walker: With 5 Inch Wheels   Patient needs a walker to treat with the following condition S/P ascending aortic aneurysm repair      12/21/22 0858            Follow-up Information     [facility] Follow up on 12/28/2022.   Specialty: Cardiothoracic Surgery Why: Appointment is at 10:30 for suture removal Contact information: [address] [place], [address] [place] [address] [phone]        [facility] Follow up on 01/20/2023.   Why: Please get CXR at 3:00 prior to your appointment at Dr. Sharee Pimple  office Contact information: [address] [place] [address]        [name], MD Follow up on 01/20/2023.   Specialty: Cardiothoracic Surgery Why: Appointment is at 4:00 Contact information: [address] [place] [address] [phone]         Corrin Parker, PA-C Follow up on 01/04/2023.   Specialty: Cardiology Why: Appointment is at 8:25 Contact information: [address] [place] [address] [place] [address] [phone]  [facility] Follow up.   Why: HHPT arranged under [facility] office referral for [facility] needs- they will contact you to schedule Contact information: [address] [place], [place] [address]  Phone: [phone]        [facility] Follow up.   Why: Rolling walker arranged- to be delivered to room prior to discharge Contact information: [address] [place] [address] [phone]                 Signed:  Leary Roca, PA-C  12/22/2022, 7:33 AM

## 2022-12-16 NOTE — Plan of Care (Signed)
  Problem: Education: Goal: Knowledge of General Education information will improve Description: Including pain rating scale, medication(s)/side effects and non-pharmacologic comfort measures Outcome: Progressing   Problem: Health Behavior/Discharge Planning: Goal: Ability to manage health-related needs will improve Outcome: Progressing   Problem: Clinical Measurements: Goal: Ability to maintain clinical measurements within normal limits will improve Outcome: Progressing Goal: Will remain free from infection Outcome: Progressing Goal: Diagnostic test results will improve Outcome: Progressing Goal: Respiratory complications will improve Outcome: Progressing Goal: Cardiovascular complication will be avoided Outcome: Progressing   Problem: Activity: Goal: Risk for activity intolerance will decrease Outcome: Progressing   Problem: Nutrition: Goal: Adequate nutrition will be maintained Outcome: Progressing   Problem: Coping: Goal: Level of anxiety will decrease Outcome: Progressing   Problem: Elimination: Goal: Will not experience complications related to bowel motility Outcome: Progressing Goal: Will not experience complications related to urinary retention Outcome: Progressing   Problem: Pain Management: Goal: General experience of comfort will improve Outcome: Progressing   Problem: Safety: Goal: Ability to remain free from injury will improve Outcome: Progressing   Problem: Skin Integrity: Goal: Risk for impaired skin integrity will decrease Outcome: Progressing   Problem: Education: Goal: Will demonstrate proper wound care and an understanding of methods to prevent future damage Outcome: Progressing Goal: Knowledge of disease or condition will improve Outcome: Progressing Goal: Knowledge of the prescribed therapeutic regimen will improve Outcome: Progressing Goal: Individualized Educational Video(s) Outcome: Progressing   Problem: Activity: Goal: Risk for  activity intolerance will decrease Outcome: Progressing   Problem: Cardiac: Goal: Will achieve and/or maintain hemodynamic stability Outcome: Progressing   Problem: Clinical Measurements: Goal: Postoperative complications will be avoided or minimized Outcome: Progressing   Problem: Respiratory: Goal: Respiratory status will improve Outcome: Progressing   Problem: Skin Integrity: Goal: Wound healing without signs and symptoms of infection Outcome: Progressing Goal: Risk for impaired skin integrity will decrease Outcome: Progressing   Problem: Urinary Elimination: Goal: Ability to achieve and maintain adequate renal perfusion and functioning will improve Outcome: Progressing

## 2022-12-17 ENCOUNTER — Inpatient Hospital Stay (HOSPITAL_COMMUNITY): Payer: Medicare Other

## 2022-12-17 ENCOUNTER — Other Ambulatory Visit: Payer: Self-pay | Admitting: Cardiology

## 2022-12-17 DIAGNOSIS — Z952 Presence of prosthetic heart valve: Secondary | ICD-10-CM

## 2022-12-17 LAB — ECHO INTRAOPERATIVE TEE
AR max vel: 2.7 cm2
AV Area VTI: 3.74 cm2
AV Area mean vel: 2.74 cm2
AV Mean grad: 1.7 mm[Hg]
AV Peak grad: 3.3 mm[Hg]
AV Vena cont: 0.43 cm
Ao pk vel: 0.91 m/s
Area-P 1/2: 3.34 cm2
Height: 63 in
MV VTI: 3.79 cm2
P 1/2 time: 504 ms
S' Lateral: 3.8 cm
Weight: 3182.38 [oz_av]

## 2022-12-17 LAB — CBC
HCT: 34.6 % — ABNORMAL LOW (ref 36.0–46.0)
Hemoglobin: 11 g/dL — ABNORMAL LOW (ref 12.0–15.0)
MCH: 25.2 pg — ABNORMAL LOW (ref 26.0–34.0)
MCHC: 31.8 g/dL (ref 30.0–36.0)
MCV: 79.4 fL — ABNORMAL LOW (ref 80.0–100.0)
Platelets: 87 10*3/uL — ABNORMAL LOW (ref 150–400)
RBC: 4.36 MIL/uL (ref 3.87–5.11)
RDW: 18.2 % — ABNORMAL HIGH (ref 11.5–15.5)
WBC: 17.4 10*3/uL — ABNORMAL HIGH (ref 4.0–10.5)
nRBC: 0 % (ref 0.0–0.2)

## 2022-12-17 LAB — BASIC METABOLIC PANEL
Anion gap: 10 (ref 5–15)
BUN: 15 mg/dL (ref 8–23)
CO2: 27 mmol/L (ref 22–32)
Calcium: 7.7 mg/dL — ABNORMAL LOW (ref 8.9–10.3)
Chloride: 105 mmol/L (ref 98–111)
Creatinine, Ser: 1.6 mg/dL — ABNORMAL HIGH (ref 0.44–1.00)
GFR, Estimated: 35 mL/min — ABNORMAL LOW (ref >60)
Glucose, Bld: 126 mg/dL — ABNORMAL HIGH (ref 70–99)
Potassium: 3.5 mmol/L (ref 3.5–5.1)
Sodium: 142 mmol/L (ref 135–145)

## 2022-12-17 LAB — GLUCOSE, CAPILLARY: Glucose-Capillary: 119 mg/dL — ABNORMAL HIGH (ref 70–99)

## 2022-12-17 MED ORDER — TRAMADOL HCL 50 MG PO TABS
50.0000 mg | ORAL_TABLET | ORAL | Status: DC | PRN
  Administered 2022-12-17 – 2022-12-22 (×8): 50 mg via ORAL
  Filled 2022-12-17 (×8): qty 1

## 2022-12-17 MED ORDER — ASPIRIN 81 MG PO TBEC
81.0000 mg | DELAYED_RELEASE_TABLET | Freq: Every day | ORAL | Status: DC
  Administered 2022-12-17 – 2022-12-22 (×6): 81 mg via ORAL
  Filled 2022-12-17 (×6): qty 1

## 2022-12-17 MED ORDER — POTASSIUM CHLORIDE 10 MEQ/50ML IV SOLN
10.0000 meq | INTRAVENOUS | Status: AC
  Administered 2022-12-17 (×3): 10 meq via INTRAVENOUS
  Filled 2022-12-17 (×3): qty 50

## 2022-12-17 NOTE — Evaluation (Signed)
Physical Therapy Evaluation Patient Details Name: Alexis Wheeler MRN: [id number] DOB: 1955/09/15 Today's Date: 12/17/2022  History of Present Illness  Pt is a 67 y/o female admitted for redo sternotomy and bental procedure in setting of severe AI. 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  Clinical Impression  Pt admitted with above diagnosis. Pt from home independently with plans to go to her friend's home who can give her supervision. Eval limited by pt lethargy. Pt arousable but closes eyes often, sometimes does not follow commands, and attempted very little verbalization. Pt able to stand from recliner with min A +2 and ambulate 18' with RW. Noted asymmetric gait pattern with fatigue and pt's daughter relays that pt has R hip OA. Patient will benefit from intensive inpatient follow up therapy, >3 hours/day   Pt currently with functional limitations due to the deficits listed below (see PT Problem List). Pt will benefit from acute skilled PT to increase their independence and safety with mobility to allow discharge.           If plan is discharge home, recommend the following: A lot of help with walking and/or transfers;A lot of help with bathing/dressing/bathroom;Assistance with cooking/housework;Direct supervision/assist for medications management;Direct supervision/assist for financial management;Assist for transportation;Help with stairs or ramp for entrance;Supervision due to cognitive status   Can travel by private vehicle        Equipment Recommendations Rolling walker (2 wheels)  Recommendations for Other Services  Rehab consult    Functional Status Assessment Patient has had a recent decline in their functional status and demonstrates the ability to make significant improvements in function in a reasonable and predictable amount of time.     Precautions / Restrictions  Precautions Precautions: Fall;Sternal Precaution Booklet Issued: Yes (comment) Precaution Comments: chest tube, external pacer, monitor O2 Restrictions Weight Bearing Restrictions: No RUE Weight Bearing: Non weight bearing LUE Weight Bearing: Non weight bearing      Mobility  Bed Mobility Overal bed mobility: Needs Assistance Bed Mobility: Sit to Supine       Sit to supine: Max assist, +2 for physical assistance, +2 for safety/equipment   General bed mobility comments: cues to lay on side with assist needed for trunk and LE mgmt    Transfers Overall transfer level: Needs assistance Equipment used: Rolling walker (2 wheels) Transfers: Sit to/from Stand, Bed to chair/wheelchair/BSC Sit to Stand: Min assist, +2 safety/equipment   Step pivot transfers: Min assist, +2 physical assistance, +2 safety/equipment       General transfer comment: Min A to Min A x 2 for safety to stand from recliner x 3 with RW. Cues to place hands on knees needed for each transfer. Min A x 2 for pivot back to bed from recliner with RW mgmt, balance and line mgmt    Ambulation/Gait Ambulation/Gait assistance: Min assist, +2 physical assistance, +2 safety/equipment Gait Distance (Feet): 18 Feet Assistive device: Rolling walker (2 wheels) Gait Pattern/deviations: Step-through pattern, Trendelenburg Gait velocity: decreased Gait velocity interpretation: <1.31 ft/sec, indicative of household ambulator   General Gait Details: trendelenburg with fatigue, pt's daughter relays that pt has OA R hip, pt unable to verbalize this. Min A to steady throughout  [employer]     Tilt Bed    Modified Rankin (Stroke Patients Only)       Balance Overall balance assessment:  Needs assistance Sitting-balance support: No upper extremity supported, Feet supported Sitting balance-Leahy Scale: Fair     Standing balance support: Bilateral upper extremity supported, During  functional activity Standing balance-Leahy Scale: Poor Standing balance comment: reliant on RW                             Pertinent Vitals/Pain Pain Assessment Pain Assessment: Faces Faces Pain Scale: Hurts a little bit Pain Location: sternum after return to supine Pain Descriptors / Indicators: Grimacing Pain Intervention(s): Monitored during session, Limited activity within patient's tolerance, Premedicated before session    Home Living Family/patient expects to be discharged to:: Private residence Living Arrangements: Alone Available Help at Discharge: Family;Friend(s) Type of Home: Apartment Home Access: Level entry       Home Layout: One level Home Equipment: Cane - single point Additional Comments: pt plans to d/c to her daughter's mother in law's house, they are close friends. She will be able to give supervision but not physical assist    Prior Function Prior Level of Function : Independent/Modified Independent             Mobility Comments: typically no AD ADLs Comments: Indep with ADLs, IADLs. enjoys hanging out with friends, drives, goes out to eat, etc     Extremity/Trunk Assessment   Upper Extremity Assessment Upper Extremity Assessment: Defer to OT evaluation    Lower Extremity Assessment Lower Extremity Assessment: Generalized weakness;Difficult to assess due to impaired cognition    Cervical / Trunk Assessment Cervical / Trunk Assessment: Normal  Communication   Communication Communication: Difficulty following commands/understanding Following commands: Follows one step commands with increased time;Follows one step commands inconsistently Cueing Techniques: Verbal cues;Gestural cues;Tactile cues  Cognition Arousal: Lethargic Behavior During Therapy: Flat affect Overall Cognitive Status: Difficult to assess                                 General Comments: per RN, pt had oxy last night and has been more droswy since  then. flat affect, follows directions and does answer some questions during session. did not wake up more with activity        General Comments General comments (skin integrity, edema, etc.): BP stable with position changes. SPO2 remained in 90's on 5L O2 via [place].    Exercises     Assessment/Plan    PT Assessment Patient needs continued PT services  PT Problem List Decreased strength;Decreased activity tolerance;Decreased balance;Decreased mobility;Decreased coordination;Decreased cognition;Decreased knowledge of use of DME;Decreased safety awareness;Decreased knowledge of precautions;Pain       PT Treatment Interventions DME instruction;Gait training;Functional mobility training;Therapeutic activities;Therapeutic exercise;Stair training;Balance training;Neuromuscular re-education;Cognitive remediation;Patient/family education    PT Goals (Current goals can be found in the Care Plan section)  Acute Rehab PT Goals Patient Stated Goal: return home PT Goal Formulation: With patient/family Time For Goal Achievement: 12/31/22 Potential to Achieve Goals: Good    Frequency Min 1X/week     Co-evaluation PT/OT/SLP Co-Evaluation/Treatment: Yes Reason for Co-Treatment: For patient/therapist safety;To address functional/ADL transfers;Other (comment) (pt lethargic and episode of dizziness this AM) PT goals addressed during session: Mobility/safety with mobility;Balance;Proper use of DME OT goals addressed during session: ADL's and self-care;Proper use of Adaptive equipment and DME       AM-PAC PT "6 Clicks" Mobility  Outcome Measure Help needed turning from your back to your side while in a flat bed without using  bedrails?: A Lot Help needed moving from lying on your back to sitting on the side of a flat bed without using bedrails?: Total Help needed moving to and from a bed to a chair (including a wheelchair)?: Total Help needed standing up from a chair using your arms (e.g., wheelchair  or bedside chair)?: Total Help needed to walk in hospital room?: Total Help needed climbing 3-5 steps with a railing? : Total 6 Click Score: 7    End of Session Equipment Utilized During Treatment: Gait belt;Oxygen Activity Tolerance: Patient limited by lethargy Patient left: in bed;with call bell/phone within reach;with family/visitor present Nurse Communication: Mobility status PT Visit Diagnosis: Unsteadiness on feet (R26.81);Muscle weakness (generalized) (M62.81);Difficulty in walking, not elsewhere classified (R26.2);Pain Pain - part of body:  (sternum)    Time: 2952-8413 PT Time Calculation (min) (ACUTE ONLY): 39 min   Charges:   PT Evaluation $PT Eval Moderate Complexity: 1 Mod   PT General Charges $$ ACUTE PT VISIT: 1 Visit         Lyanne Co, PT  Acute Rehab Services Secure chat preferred Office [phone]   Lawana Chambers Vicke Plotner 12/17/2022, 1:57 PM

## 2022-12-17 NOTE — Progress Notes (Signed)
EVENING ROUNDS NOTE :     [address]       [place] [address]             [phone]                 2 Days Post-Op Procedure(s) (LRB): REDO STERNOTOMY (N/A) BENTALL PROCEDURE USING 23 MM KONECT RESILIA AORTIC VALVED CONDUIT (N/A) TRANSESOPHAGEAL ECHOCARDIOGRAM (TEE) (N/A)   Total Length of Stay:  LOS: 2 days  Events:   No events    BP 119/73   Pulse 75   Temp 97.9 F (36.6 C) (Oral)   Resp (!) 9   Ht 5\' 3"  (1.6 m)   Wt 96.8 kg   SpO2 96%   BMI 37.80 kg/m           I/O last 3 completed shifts: In: 1141.6 [P.O.:270; I.V.:131.5; IV Piggyback:740.1] Out: 1905 [Urine:1635; Chest Tube:270]      Latest Ref Rng & Units 12/17/2022    4:28 AM 12/16/2022    5:52 PM 12/16/2022    6:45 AM  CBC  WBC 4.0 - 10.5 K/uL 17.4  15.7    Hemoglobin 12.0 - 15.0 g/dL 21.3  08.6  57.8   Hematocrit 36.0 - 46.0 % 34.6  32.9  32.0   Platelets 150 - 400 K/uL 87  87         Latest Ref Rng & Units 12/17/2022    4:28 AM 12/16/2022    5:52 PM 12/16/2022    6:45 AM  BMP  Glucose 70 - 99 mg/dL 469  629    BUN 8 - 23 mg/dL 15  9    Creatinine 5.28 - 1.00 mg/dL 4.13  2.44    Sodium 010 - 145 mmol/L 142  142  145   Potassium 3.5 - 5.1 mmol/L 3.5  3.5  3.7   Chloride 98 - 111 mmol/L 105  108    CO2 22 - 32 mmol/L 27  28    Calcium 8.9 - 10.3 mg/dL 7.7  7.6      ABG    Component Value Date/Time   PHART 7.306 (L) 12/16/2022 0645   PCO2ART 50.4 (H) 12/16/2022 0645   PO2ART 71 (L) 12/16/2022 0645   HCO3 25.3 12/16/2022 0645   TCO2 27 12/16/2022 0645   ACIDBASEDEF 2.0 12/16/2022 0645   O2SAT 93 12/16/2022 0645       Ariyan Sinnett, MD 12/17/2022 8:09 PM

## 2022-12-17 NOTE — Progress Notes (Signed)
2 Days Post-Op Procedure(s) (LRB): REDO STERNOTOMY (N/A) BENTALL PROCEDURE USING 23 MM KONECT RESILIA AORTIC VALVED CONDUIT (N/A) TRANSESOPHAGEAL ECHOCARDIOGRAM (TEE) (N/A) Subjective: No complaints. Nurse reports some dizziness with getting up so only got to chair. BP has been fine.  Objective: Vital signs in last 24 hours: Temp:  [97.3 F (36.3 C)-98.1 F (36.7 C)] 98.1 F (36.7 C) (12/04 1600) Pulse Rate:  [70-82] 80 (12/05 0600) Cardiac Rhythm: Atrial paced (12/04 2100) Resp:  [4-19] 5 (12/05 0200) BP: (88-128)/(48-74) 118/63 (12/05 0600) SpO2:  [89 %-99 %] 96 % (12/05 0600) Arterial Line BP: (98-122)/(50-61) 118/55 (12/04 1300) Weight:  [96.8 kg] 96.8 kg (12/05 0500)  Hemodynamic parameters for last 24 hours:    Intake/Output from previous day: 12/04 0701 - 12/05 0700 In: 551.7 [P.O.:30; I.V.:131.5; IV Piggyback:390.3] Out: 1405 [Urine:1225; Chest Tube:180] Intake/Output this shift: Total I/O In: 230.1 [P.O.:30; IV Piggyback:200.1] Out: 435 [Urine:375; Chest Tube:60]  General appearance: awake but sluggish. Received some oxy last night for pain and slept some. Neurologic: intact Heart: regular rate and rhythm, S1, S2 normal, no murmur Lungs: clear to auscultation bilaterally Extremities: mild edema, SCD's in place. Wound: Aquacel dressing with mild drainage.  Lab Results: Recent Labs    12/16/22 1752 12/17/22 0428  WBC 15.7* 17.4*  HGB 10.4* 11.0*  HCT 32.9* 34.6*  PLT 87* 87*   BMET:  Recent Labs    12/16/22 1752 12/17/22 0428  NA 142 142  K 3.5 3.5  CL 108 105  CO2 28 27  GLUCOSE 137* 126*  BUN 9 15  CREATININE 1.24* 1.60*  CALCIUM 7.6* 7.7*    PT/INR:  Recent Labs    12/15/22 1718  LABPROT 20.2*  INR 1.7*   ABG    Component Value Date/Time   PHART 7.306 (L) 12/16/2022 0645   HCO3 25.3 12/16/2022 0645   TCO2 27 12/16/2022 0645   ACIDBASEDEF 2.0 12/16/2022 0645   O2SAT 93 12/16/2022 0645   CBG (last 3)  Recent Labs     12/16/22 2013 12/16/22 2329 12/17/22 0426  GLUCAP 110* 113* 119*   CXR: mild left lung segmental atelectasis around chest tube. Otherwise lungs looks clear and no pleural effusion.  Assessment/Plan: S/P Procedure(s) (LRB): REDO STERNOTOMY (N/A) BENTALL PROCEDURE USING 23 MM KONECT RESILIA AORTIC VALVED CONDUIT (N/A) TRANSESOPHAGEAL ECHOCARDIOGRAM (TEE) (N/A)  POD 2 Hemodynamically stable. Rhythm is sinus under pacer but atrial pacing at 90 to maximize BP. Holding off on beta blocker.  -613 cc yesterday and wt down 4 lbs. Creat bumped with diuresis so will hold off on diuresis today and let that recover.   CT output 180 cc yesterday and 60 cc last shift if accurate. Thin bloody fluid. Will keep chest tubes in this am and may removed later today.  Glucose under good control and no hx of DM. Will stop CBG's.  Thrombocytopenia: hold Lovenox.  Hx of DVT and large PE in past. Continue SCD's at all times and plan to resume Xarelto prior to discharge.  IS, OOB, PT/OT.   LOS: 2 days    [name] 12/17/2022

## 2022-12-17 NOTE — Discharge Instructions (Addendum)
Discharge Instructions:  1. You may shower, please wash incisions daily with soap and water and keep dry.  If you wish to cover wounds with dressing you may do so but please keep clean and change daily.  No tub baths or swimming until incisions have completely healed.  If your incisions become red or develop any drainage please call our office at [phone]  2. No Driving until cleared by Dr. Sharee Pimple office and you are no longer using narcotic pain medications  3. Monitor your weight daily.. Please use the same scale and weigh at same time... If you gain 5-10 lbs in 48 hours with associated lower extremity swelling, please contact our office at [phone]  4. Fever of 101.5 for at least 24 hours with no source, please contact our office at [phone]  5. Activity- up as tolerated, please walk at least 3 times per day.  Avoid strenuous activity, no lifting, pushing, or pulling with your arms over 8-10 lbs for a minimum of 6 weeks  6. If any questions or concerns arise, please do not hesitate to contact our office at [phone]  Information on my medicine - ELIQUIS (apixaban)  This medication education was reviewed with me or my healthcare representative as part of my discharge preparation.    Why was Eliquis prescribed for you? Eliquis was prescribed to treat blood clots that may have been found in the veins of your legs (deep vein thrombosis) or in your lungs (pulmonary embolism) and to reduce the risk of them occurring again.  What do You need to know about Eliquis ? Continue Eliquis 5 mg tablet taken TWICE daily.  Eliquis may be taken with or without food.   Try to take the dose about the same time in the morning and in the evening. If you have difficulty swallowing the tablet whole please discuss with your pharmacist how to take the medication safely.  Take Eliquis exactly as prescribed and DO NOT stop taking Eliquis without talking to the doctor who prescribed the  medication.  Stopping may increase your risk of developing a new blood clot.  Refill your prescription before you run out.  After discharge, you should have regular check-up appointments with your healthcare provider that is prescribing your Eliquis.    What do you do if you miss a dose? If a dose of ELIQUIS is not taken at the scheduled time, take it as soon as possible on the same day and twice-daily administration should be resumed. The dose should not be doubled to make up for a missed dose.  Important Safety Information A possible side effect of Eliquis is bleeding. You should call your healthcare provider right away if you experience any of the following: Bleeding from an injury or your nose that does not stop. Unusual colored urine (red or dark brown) or unusual colored stools (red or black). Unusual bruising for unknown reasons. A serious fall or if you hit your head (even if there is no bleeding).  Some medicines may interact with Eliquis and might increase your risk of bleeding or clotting while on Eliquis. To help avoid this, consult your healthcare provider or pharmacist prior to using any new prescription or non-prescription medications, including herbals, vitamins, non-steroidal anti-inflammatory drugs (NSAIDs) and supplements.  This website has more information on Eliquis (apixaban): http://www.eliquis.com/eliquis/home

## 2022-12-17 NOTE — Plan of Care (Signed)
  Problem: Education: Goal: Knowledge of General Education information will improve Description: Including pain rating scale, medication(s)/side effects and non-pharmacologic comfort measures Outcome: Progressing   Problem: Health Behavior/Discharge Planning: Goal: Ability to manage health-related needs will improve Outcome: Progressing   Problem: Clinical Measurements: Goal: Ability to maintain clinical measurements within normal limits will improve Outcome: Progressing Goal: Will remain free from infection Outcome: Progressing Goal: Diagnostic test results will improve Outcome: Progressing Goal: Respiratory complications will improve Outcome: Progressing Goal: Cardiovascular complication will be avoided Outcome: Progressing   Problem: Activity: Goal: Risk for activity intolerance will decrease Outcome: Progressing   Problem: Nutrition: Goal: Adequate nutrition will be maintained Outcome: Progressing   Problem: Coping: Goal: Level of anxiety will decrease Outcome: Progressing   Problem: Elimination: Goal: Will not experience complications related to bowel motility Outcome: Progressing Goal: Will not experience complications related to urinary retention Outcome: Progressing   Problem: Pain Management: Goal: General experience of comfort will improve Outcome: Progressing   Problem: Safety: Goal: Ability to remain free from injury will improve Outcome: Progressing   Problem: Skin Integrity: Goal: Risk for impaired skin integrity will decrease Outcome: Progressing   Problem: Education: Goal: Will demonstrate proper wound care and an understanding of methods to prevent future damage Outcome: Progressing Goal: Knowledge of disease or condition will improve Outcome: Progressing Goal: Knowledge of the prescribed therapeutic regimen will improve Outcome: Progressing Goal: Individualized Educational Video(s) Outcome: Progressing   Problem: Activity: Goal: Risk for  activity intolerance will decrease Outcome: Progressing   Problem: Cardiac: Goal: Will achieve and/or maintain hemodynamic stability Outcome: Progressing   Problem: Clinical Measurements: Goal: Postoperative complications will be avoided or minimized Outcome: Progressing   Problem: Respiratory: Goal: Respiratory status will improve Outcome: Progressing   Problem: Skin Integrity: Goal: Wound healing without signs and symptoms of infection Outcome: Progressing Goal: Risk for impaired skin integrity will decrease Outcome: Progressing   Problem: Urinary Elimination: Goal: Ability to achieve and maintain adequate renal perfusion and functioning will improve Outcome: Progressing

## 2022-12-17 NOTE — Progress Notes (Signed)
  Inpatient Rehab Admissions Coordinator :  Per therapy recommendations patient was screened for CIR candidacy by Ottie Glazier RN MSN. Noted by therapy to be limited on eval likely due to patient lethargy. Would like to see how she mobilizes when more alert before determining rehab venue needs.  The CIR admissions team will follow and monitor for progress and place a Rehab Consult order if felt to be appropriate. Please contact me with any questions.  Ottie Glazier RN MSN Admissions Coordinator [phone]

## 2022-12-17 NOTE — Anesthesia Postprocedure Evaluation (Signed)
Anesthesia Post Note  Patient: Alexis Wheeler  Procedure(s) Performed: REDO STERNOTOMY BENTALL PROCEDURE USING 23 MM KONECT RESILIA AORTIC VALVED CONDUIT (Chest) TRANSESOPHAGEAL ECHOCARDIOGRAM (TEE)     Patient location during evaluation: SICU Anesthesia Type: General Level of consciousness: sedated Pain management: pain level controlled Vital Signs Assessment: post-procedure vital signs reviewed and stable Respiratory status: patient remains intubated per anesthesia plan Cardiovascular status: stable Postop Assessment: no apparent nausea or vomiting Anesthetic complications: no   No notable events documented.  Last Vitals:  Vitals:   12/17/22 2100 12/17/22 2200  BP: 121/67 122/69  Pulse: 75 76  Resp: 10 (!) 9  Temp:    SpO2: 96% 96%    Last Pain:  Vitals:   12/17/22 2200  TempSrc:   PainSc: 0-No pain                 Earl Lites P Tashana Haberl

## 2022-12-17 NOTE — Evaluation (Signed)
Occupational Therapy Evaluation Patient Details Name: Alexis Wheeler MRN: [id number] DOB: [birth year] Today's Date: 12/17/2022   History of Present Illness Pt is a 67 y/o female admitted for redo sternotomy and bental procedure in setting of severe AI. 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   Clinical Impression   PTA, pt lives alone and typically completely independent in all ADLs, IADLs and mobility without AD. Pt presents now lethargic (d/t pain meds?) and with deficits in strength, standing balance, cardiopulmonary endurance. Pt able to follow directions and answer questions with increased time. Overall, Min A x 2 for in-room mobility using RW and approximately Mod A for ADLs d/t deficits. Pt will benefit from further sternal precaution reinforcement and further OOB ADL assessments pending improved alertness. Pt's plan is to discharge to a friend's home and will need to be able to manage mobility/ADLs without significant physical assistance. Recommend consideration of intensive rehab services prior to DC home.      If plan is discharge home, recommend the following: A little help with walking and/or transfers;A lot of help with bathing/dressing/bathroom;Assistance with cooking/housework;Direct supervision/assist for medications management;Direct supervision/assist for financial management;Assist for transportation;Help with stairs or ramp for entrance    Functional Status Assessment  Patient has had a recent decline in their functional status and demonstrates the ability to make significant improvements in function in a reasonable and predictable amount of time.  Equipment Recommendations  Other (comment);BSC/3in1 (RW)    Recommendations for Other Services Rehab consult     Precautions / Restrictions Precautions Precautions: Fall;Sternal Precaution Booklet Issued: Yes  (comment) Precaution Comments: chest tube, external pacer, monitor O2 Restrictions Weight Bearing Restrictions: No      Mobility Bed Mobility Overal bed mobility: Needs Assistance Bed Mobility: Sit to Supine       Sit to supine: Max assist, +2 for physical assistance, +2 for safety/equipment   General bed mobility comments: cues to lay on side with assist needed for trunk and LE mgmt    Transfers Overall transfer level: Needs assistance Equipment used: Rolling walker (2 wheels) Transfers: Sit to/from Stand, Bed to chair/wheelchair/BSC Sit to Stand: Min assist, +2 safety/equipment     Step pivot transfers: Min assist, +2 physical assistance, +2 safety/equipment     General transfer comment: Min A to Min A x 2 for safety to stand from recliner x 3 with RW. Cues to place hands on knees needed for each transfer. Min A x 2 for pivot back to bed from recliner with RW mgmt, balance and line mgmt      Balance Overall balance assessment: Needs assistance Sitting-balance support: No upper extremity supported, Feet supported Sitting balance-Leahy Scale: Fair     Standing balance support: Bilateral upper extremity supported, During functional activity Standing balance-Leahy Scale: Poor                             ADL either performed or assessed with clinical judgement   ADL Overall ADL's : Needs assistance/impaired Eating/Feeding: Set up   Grooming: Minimal assistance;Standing   Upper Body Bathing: Moderate assistance;Sitting   Lower Body Bathing: Moderate assistance;Sitting/lateral leans;Sit to/from stand   Upper Body Dressing : Moderate assistance   Lower Body Dressing: Moderate assistance   Toilet Transfer: Minimal assistance;+2 for safety/equipment;Ambulation;Rolling walker (2 wheels)   Toileting- Clothing Manipulation and Hygiene: Moderate assistance;Sitting/lateral lean;Sit to/from stand  Functional mobility during ADLs: Minimal assistance;+2  for physical assistance;+2 for safety/equipment;Rolling walker (2 wheels);Cueing for safety;Cueing for sequencing General ADL Comments: Difficult to determine true ADL needs due to drowsiness during eval. able to mobilize to door in room with RW and Min A x 2, does require cues for sternal prec mgmt     Vision Baseline Vision/History: 1 Wears glasses Ability to See in Adequate Light: 0 Adequate Patient Visual Report: No change from baseline Vision Assessment?: No apparent visual deficits     Perception         Praxis         Pertinent Vitals/Pain Pain Assessment Pain Assessment: Faces Faces Pain Scale: Hurts a little bit Pain Location: sternum after return to supine Pain Descriptors / Indicators: Grimacing Pain Intervention(s): Monitored during session, Limited activity within patient's tolerance, Repositioned     Extremity/Trunk Assessment Upper Extremity Assessment Upper Extremity Assessment: Generalized weakness;Right hand dominant   Lower Extremity Assessment Lower Extremity Assessment: Defer to PT evaluation   Cervical / Trunk Assessment Cervical / Trunk Assessment: Normal   Communication Communication Communication: Difficulty following commands/understanding Following commands: Follows one step commands with increased time Cueing Techniques: Verbal cues;Gestural cues   Cognition Arousal: Lethargic Behavior During Therapy: Flat affect Overall Cognitive Status: Difficult to assess                                 General Comments: per RN, pt had oxy last night and has been more droswy since then. flat affect, follows directions and does answer questions during session. did not wake up more with activity     General Comments       Exercises     Shoulder Instructions      Home Living Family/patient expects to be discharged to:: Private residence Living Arrangements: Alone Available Help at Discharge: Family;Friend(s) Type of Home:  Apartment Home Access: Level entry     Home Layout: One level     Bathroom Shower/Tub: Chief Strategy Officer: Standard     Home Equipment: Cane - single point          Prior Functioning/Environment Prior Level of Function : Independent/Modified Independent             Mobility Comments: typically no AD ADLs Comments: Indep with ADLs, IADLs. enjoys hanging out with friends, drives, goes out to eat, etc        OT Problem List: Decreased strength;Decreased activity tolerance;Impaired balance (sitting and/or standing);Decreased safety awareness;Decreased cognition;Decreased knowledge of use of DME or AE;Decreased knowledge of precautions;Cardiopulmonary status limiting activity      OT Treatment/Interventions: Self-care/ADL training;Therapeutic exercise;Energy conservation;DME and/or AE instruction;Therapeutic activities;Patient/family education    OT Goals(Current goals can be found in the care plan section) Acute Rehab OT Goals Patient Stated Goal: interested in rehab if needed but hoping to improve alertness to progress home OT Goal Formulation: With patient/family Time For Goal Achievement: 12/31/22 Potential to Achieve Goals: Good ADL Goals Pt Will Perform Lower Body Bathing: with supervision;sit to/from stand;sitting/lateral leans Pt Will Perform Lower Body Dressing: with supervision;sitting/lateral leans;sit to/from stand Pt Will Transfer to Toilet: with supervision;ambulating Pt Will Perform Toileting - Clothing Manipulation and hygiene: with supervision;sit to/from stand;sitting/lateral leans  OT Frequency: Min 1X/week    Co-evaluation PT/OT/SLP Co-Evaluation/Treatment: Yes Reason for Co-Treatment: For patient/therapist safety;To address functional/ADL transfers;Other (comment) (pt lethargic and episode of dizziness this AM)   OT goals addressed during  session: ADL's and self-care;Proper use of Adaptive equipment and DME      AM-PAC OT "6  Clicks" Daily Activity     Outcome Measure Help from another person eating meals?: A Little Help from another person taking care of personal grooming?: A Little Help from another person toileting, which includes using toliet, bedpan, or urinal?: A Lot Help from another person bathing (including washing, rinsing, drying)?: A Lot Help from another person to put on and taking off regular upper body clothing?: A Lot Help from another person to put on and taking off regular lower body clothing?: A Lot 6 Click Score: 14   End of Session Equipment Utilized During Treatment: Gait belt;Rolling walker (2 wheels);Oxygen Nurse Communication: Mobility status  Activity Tolerance: Patient limited by lethargy;Patient tolerated treatment well Patient left: in bed;with call bell/phone within reach;with family/visitor present  OT Visit Diagnosis: Unsteadiness on feet (R26.81);Other abnormalities of gait and mobility (R26.89);Muscle weakness (generalized) (M62.81)                Time: 1010-1050 OT Time Calculation (min): 40 min Charges:  OT General Charges $OT Visit: 1 Visit OT Evaluation $OT Eval Moderate Complexity: 1 Mod  Bradd Canary, OTR/L Acute Rehab Services Office: [phone]   Lorre Munroe 12/17/2022, 11:24 AM

## 2022-12-18 ENCOUNTER — Inpatient Hospital Stay (HOSPITAL_COMMUNITY): Payer: Medicare Other

## 2022-12-18 LAB — BASIC METABOLIC PANEL
Anion gap: 5 (ref 5–15)
Anion gap: 11 (ref 5–15)
BUN: 17 mg/dL (ref 8–23)
BUN: 17 mg/dL (ref 8–23)
CO2: 28 mmol/L (ref 22–32)
CO2: 33 mmol/L — ABNORMAL HIGH (ref 22–32)
Calcium: 7.9 mg/dL — ABNORMAL LOW (ref 8.9–10.3)
Calcium: 7.9 mg/dL — ABNORMAL LOW (ref 8.9–10.3)
Chloride: 104 mmol/L (ref 98–111)
Chloride: 99 mmol/L (ref 98–111)
Creatinine, Ser: 1.19 mg/dL — ABNORMAL HIGH (ref 0.44–1.00)
Creatinine, Ser: 1.16 mg/dL — ABNORMAL HIGH (ref 0.44–1.00)
GFR, Estimated: 50 mL/min — ABNORMAL LOW (ref >60)
GFR, Estimated: 51 mL/min — ABNORMAL LOW (ref >60)
Glucose, Bld: 126 mg/dL — ABNORMAL HIGH (ref 70–99)
Glucose, Bld: 134 mg/dL — ABNORMAL HIGH (ref 70–99)
Potassium: 3.6 mmol/L (ref 3.5–5.1)
Potassium: 2.9 mmol/L — ABNORMAL LOW (ref 3.5–5.1)
Sodium: 137 mmol/L (ref 135–145)
Sodium: 143 mmol/L (ref 135–145)

## 2022-12-18 LAB — COMPREHENSIVE METABOLIC PANEL
ALT: 7 U/L (ref 0–44)
AST: 12 U/L — ABNORMAL LOW (ref 15–41)
Albumin: 2.5 g/dL — ABNORMAL LOW (ref 3.5–5.0)
Alkaline Phosphatase: 47 U/L (ref 38–126)
Anion gap: 11 (ref 5–15)
BUN: 15 mg/dL (ref 8–23)
CO2: 31 mmol/L (ref 22–32)
Calcium: 7.7 mg/dL — ABNORMAL LOW (ref 8.9–10.3)
Chloride: 99 mmol/L (ref 98–111)
Creatinine, Ser: 1.05 mg/dL — ABNORMAL HIGH (ref 0.44–1.00)
GFR, Estimated: 58 mL/min — ABNORMAL LOW (ref >60)
Glucose, Bld: 136 mg/dL — ABNORMAL HIGH (ref 70–99)
Potassium: 3 mmol/L — ABNORMAL LOW (ref 3.5–5.1)
Sodium: 141 mmol/L (ref 135–145)
Total Bilirubin: 0.5 mg/dL (ref <1.2)
Total Protein: 5.2 g/dL — ABNORMAL LOW (ref 6.5–8.1)

## 2022-12-18 LAB — CBC
HCT: 30.4 % — ABNORMAL LOW (ref 36.0–46.0)
HCT: 32.1 % — ABNORMAL LOW (ref 36.0–46.0)
Hemoglobin: 9.7 g/dL — ABNORMAL LOW (ref 12.0–15.0)
Hemoglobin: 10.5 g/dL — ABNORMAL LOW (ref 12.0–15.0)
MCH: 25 pg — ABNORMAL LOW (ref 26.0–34.0)
MCH: 25.6 pg — ABNORMAL LOW (ref 26.0–34.0)
MCHC: 31.9 g/dL (ref 30.0–36.0)
MCHC: 32.7 g/dL (ref 30.0–36.0)
MCV: 78.4 fL — ABNORMAL LOW (ref 80.0–100.0)
MCV: 78.3 fL — ABNORMAL LOW (ref 80.0–100.0)
Platelets: 76 10*3/uL — ABNORMAL LOW (ref 150–400)
Platelets: 81 10*3/uL — ABNORMAL LOW (ref 150–400)
RBC: 3.88 MIL/uL (ref 3.87–5.11)
RBC: 4.1 MIL/uL (ref 3.87–5.11)
RDW: 18.8 % — ABNORMAL HIGH (ref 11.5–15.5)
RDW: 18.6 % — ABNORMAL HIGH (ref 11.5–15.5)
WBC: 14.5 10*3/uL — ABNORMAL HIGH (ref 4.0–10.5)
WBC: 15.5 10*3/uL — ABNORMAL HIGH (ref 4.0–10.5)
nRBC: 0 % (ref 0.0–0.2)
nRBC: 0 % (ref 0.0–0.2)

## 2022-12-18 LAB — MAGNESIUM
Magnesium: 1.9 mg/dL (ref 1.7–2.4)
Magnesium: 1.7 mg/dL (ref 1.7–2.4)

## 2022-12-18 MED ORDER — POTASSIUM CHLORIDE 10 MEQ/50ML IV SOLN
10.0000 meq | INTRAVENOUS | Status: AC
  Administered 2022-12-18 (×3): 10 meq via INTRAVENOUS
  Filled 2022-12-18 (×2): qty 50

## 2022-12-18 MED ORDER — TORSEMIDE 20 MG PO TABS
40.0000 mg | ORAL_TABLET | Freq: Every day | ORAL | Status: DC
  Administered 2022-12-18: 40 mg via ORAL
  Filled 2022-12-18: qty 2

## 2022-12-18 MED ORDER — METOPROLOL TARTRATE 12.5 MG HALF TABLET
12.5000 mg | ORAL_TABLET | Freq: Two times a day (BID) | ORAL | Status: DC
  Administered 2022-12-18 – 2022-12-20 (×6): 12.5 mg via ORAL
  Filled 2022-12-18 (×6): qty 1

## 2022-12-18 MED ORDER — POTASSIUM CHLORIDE 10 MEQ/50ML IV SOLN
10.0000 meq | INTRAVENOUS | Status: AC
  Administered 2022-12-18 (×3): 10 meq via INTRAVENOUS
  Filled 2022-12-18 (×3): qty 50

## 2022-12-18 MED ORDER — MAGNESIUM SULFATE 2 GM/50ML IV SOLN
2.0000 g | Freq: Once | INTRAVENOUS | Status: AC
  Administered 2022-12-19: 2 g via INTRAVENOUS
  Filled 2022-12-18: qty 50

## 2022-12-18 MED ORDER — POTASSIUM CHLORIDE CRYS ER 20 MEQ PO TBCR
20.0000 meq | EXTENDED_RELEASE_TABLET | ORAL | Status: AC
  Administered 2022-12-18 (×3): 20 meq via ORAL
  Filled 2022-12-18 (×3): qty 1

## 2022-12-18 MED ORDER — POTASSIUM CHLORIDE CRYS ER 20 MEQ PO TBCR: 20.0000 meq | EXTENDED_RELEASE_TABLET | Freq: Two times a day (BID) | ORAL | Status: DC

## 2022-12-18 MED ORDER — APIXABAN 5 MG PO TABS
5.0000 mg | ORAL_TABLET | Freq: Two times a day (BID) | ORAL | Status: DC
  Administered 2022-12-19 – 2022-12-22 (×7): 5 mg via ORAL
  Filled 2022-12-18 (×7): qty 1

## 2022-12-18 MED FILL — Lidocaine HCl Local Preservative Free (PF) Inj 2%: INTRAMUSCULAR | Qty: 14 | Status: AC

## 2022-12-18 MED FILL — Potassium Chloride Inj 2 mEq/ML: INTRAVENOUS | Qty: 40 | Status: AC

## 2022-12-18 MED FILL — Heparin Sodium (Porcine) Inj 1000 Unit/ML: Qty: 1000 | Status: AC

## 2022-12-18 NOTE — Progress Notes (Addendum)
3 Days Post-Op Procedure(s) (LRB): REDO STERNOTOMY (N/A) BENTALL PROCEDURE USING 23 MM KONECT RESILIA AORTIC VALVED CONDUIT (N/A) TRANSESOPHAGEAL ECHOCARDIOGRAM (TEE) (N/A) Subjective: Feels better today. Sitting up eating breakfast. Ambulated down the hall. Had BM. Much more alert.  Objective: Vital signs in last 24 hours: Temp:  [97.7 F (36.5 C)-97.9 F (36.6 C)] 97.7 F (36.5 C) (12/05 2000) Pulse Rate:  [0-89] 77 (12/06 0700) Cardiac Rhythm: Normal sinus rhythm (12/06 0500) Resp:  [6-26] 14 (12/06 0700) BP: (97-141)/(52-80) 139/71 (12/06 0700) SpO2:  [90 %-99 %] 92 % (12/06 0700) Weight:  [97.4 kg] 97.4 kg (12/06 0500)  Hemodynamic parameters for last 24 hours:    Intake/Output from previous day: 12/05 0701 - 12/06 0700 In: 398.3 [IV Piggyback:338.3] Out: 875 [Urine:825; Chest Tube:50] Intake/Output this shift: No intake/output data recorded.  General appearance: alert and cooperative Neurologic: intact Heart: regular rate and rhythm, S1, S2 normal, no murmur Lungs: clear to auscultation bilaterally Extremities: edema mild Wound: incision ok.   Lab Results: Recent Labs    12/18/22 0444 12/18/22 0604  WBC 14.5* 15.5*  HGB 9.7* 10.5*  HCT 30.4* 32.1*  PLT 76* 81*   BMET:  Recent Labs    12/17/22 0428 12/18/22 0604  NA 142 137  K 3.5 3.6  CL 105 104  CO2 27 28  GLUCOSE 126* 126*  BUN 15 17  CREATININE 1.60* 1.19*  CALCIUM 7.7* 7.9*    PT/INR:  Recent Labs    12/15/22 1718  LABPROT 20.2*  INR 1.7*   ABG    Component Value Date/Time   PHART 7.306 (L) 12/16/2022 0645   HCO3 25.3 12/16/2022 0645   TCO2 27 12/16/2022 0645   ACIDBASEDEF 2.0 12/16/2022 0645   O2SAT 93 12/16/2022 0645   CBG (last 3)  Recent Labs    12/16/22 2013 12/16/22 2329 12/17/22 0426  GLUCAP 110* 113* 119*   CXR: mild segmental atelectasis LUL. Mild left base atelectasis.  Assessment/Plan: S/P Procedure(s) (LRB): REDO STERNOTOMY (N/A) BENTALL PROCEDURE USING 23  MM KONECT RESILIA AORTIC VALVED CONDUIT (N/A) TRANSESOPHAGEAL ECHOCARDIOGRAM (TEE) (N/A)  POD 3 Hemodynamically stable in sinus rhythm 80's. Will remove pacing wires today. Start low dose Lopressor. Plan to Convert back to Coreg once BP comes up.  Creatinine back to normal. Start diuresis with Demedex and replace K+  DC foley and sleeve.  Plan to resume Xarelto tomorrow for hx of DVT and massive PE.  Continue IS, OOB, PT, OT.   LOS: 3 days    [name] 12/18/2022

## 2022-12-18 NOTE — Plan of Care (Signed)
  Problem: Health Behavior/Discharge Planning: Goal: Ability to manage health-related needs will improve Outcome: Progressing   Problem: Clinical Measurements: Goal: Ability to maintain clinical measurements within normal limits will improve Outcome: Progressing Goal: Will remain free from infection Outcome: Progressing Goal: Diagnostic test results will improve Outcome: Progressing Goal: Respiratory complications will improve Outcome: Progressing Goal: Cardiovascular complication will be avoided Outcome: Progressing   Problem: Activity: Goal: Risk for activity intolerance will decrease Outcome: Not Progressing   Problem: Nutrition: Goal: Adequate nutrition will be maintained Outcome: Progressing   Problem: Coping: Goal: Level of anxiety will decrease Outcome: Progressing   Problem: Elimination: Goal: Will not experience complications related to bowel motility Outcome: Progressing Goal: Will not experience complications related to urinary retention Outcome: Progressing   Problem: Pain Management: Goal: General experience of comfort will improve Outcome: Progressing   Problem: Safety: Goal: Ability to remain free from injury will improve Outcome: Progressing   Problem: Skin Integrity: Goal: Risk for impaired skin integrity will decrease Outcome: Progressing

## 2022-12-18 NOTE — TOC Initial Note (Addendum)
Transition of Care [facility]) - Initial/Assessment Note    Patient Details  Name: Alexis Wheeler MRN: [id number] Date of Birth: 02/16/1955  [facility] CM/SW Contact:    Elliot Cousin, RN Phone Number: [phone] 12/18/2022, 3:54 PM  Clinical Narrative:                 CM spoke to pt and states she will be staying with SIL's mother.  States she has RW and cane at home.  Pt state she was independent pta. Offered choice for [facility], agreeable to [facility] for [facility]. Medicare.gov list placed on chart.  Will continue to follow for dc needs.   She will dc with family, [phone] address [address], [place]   Expected Discharge Plan: Home w Home Health Services Barriers to Discharge: Continued Medical Work up   Patient Goals and CMS Choice Patient states their goals for this hospitalization and ongoing recovery are:: wants to remain independent CMS Medicare.gov Compare Post Acute Care list provided to:: Patient Choice offered to / list presented to : Patient      Expected Discharge Plan and Services   Discharge Planning Services: CM Consult Post Acute Care Choice: Home Health Living arrangements for the past 2 months: Apartment                           HH Arranged: RN HH Agency: [facility] Date HH Agency Contacted: 12/18/22 Time HH Agency Contacted: 1553 Representative spoke with at [facility] Agency: Clarise Cruz  Prior Living Arrangements/Services Living arrangements for the past 2 months: Apartment Lives with:: Self Patient language and need for interpreter reviewed:: Yes Do you feel safe going back to the place where you live?: Yes      Need for Family Participation in Patient Care: Yes (Comment) Care giver support system in place?: Yes (comment) Current home services: DME (rolling walker, cane)    Activities of Daily Living   ADL Screening (condition at time of admission) Independently performs  ADLs?: No Does the patient have a NEW difficulty with bathing/dressing/toileting/self-feeding that is expected to last >3 days?: Yes (Initiates electronic notice to provider for possible OT consult) Does the patient have a NEW difficulty with getting in/out of bed, walking, or climbing stairs that is expected to last >3 days?: Yes (Initiates electronic notice to provider for possible PT consult) Does the patient have a NEW difficulty with communication that is expected to last >3 days?: Yes (Initiates electronic notice to provider for possible SLP consult) Is the patient deaf or have difficulty hearing?: No Does the patient have difficulty seeing, even when wearing glasses/contacts?: Yes (d/t cataracts) Does the patient have difficulty concentrating, remembering, or making decisions?: No  Permission Sought/Granted Permission sought to share information with : Case Manager, Family Supports, PCP Permission granted to share information with : Yes, Verbal Permission Granted  Share Information with NAME: Melanie Crazier  Permission granted to share info w AGENCY: Home health  Permission granted to share info w Relationship: daughter  Permission granted to share info w Contact Information: [phone]  Emotional Assessment Appearance:: Appears stated age Attitude/Demeanor/Rapport: Engaged Affect (typically observed): Accepting Orientation: : Oriented to Self, Oriented to Place, Oriented to  Time, Oriented to Situation   Psych Involvement: No (comment)  Admission diagnosis:  S/P AVR (aortic valve replacement) [Z95.2] Patient Active Problem List   Diagnosis Date Noted   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 12/15/2022   Aortic insufficiency 07/25/2020   Acute pulmonary embolism (HCC) 04/01/2020   Saddle embolus of pulmonary artery (HCC)    Dyspnea    Irritable bowel syndrome 05/23/2019   Ascending aortic dissection (HCC)    OSA on CPAP 05/10/2017   Benign  essential HTN 05/10/2017   Coronary artery disease 03/08/2016   Diastolic dysfunction without heart failure 03/08/2016   Chest pain 03/07/2016   Fatigue 04/30/2015   Hypokalemia 12/20/2014   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   Plantar fasciitis of left foot 05/02/2014   Non-compliant behavior 03/27/2014   S/P aortic dissection repair 03/03/2014   Lumbosacral spondylosis without myelopathy 01/26/2014   Greater trochanteric bursitis of right hip 10/10/2013   Chronic low back pain 09/26/2013   Chronic right hip pain 09/26/2013   Allergic rhinitis 08/29/2012   Kidney stone 12/31/2010   Morbid obesity (HCC) 12/18/2009   GERD 12/18/2009   HEMATURIA UNSPECIFIED 12/17/2009   Hyperlipidemia with target LDL less than 70 12/16/2009   Hypertensive heart disease 12/16/2009   PCP:  [name], MD Pharmacy:   [id number] - [place], [place] - [address] [place] [address] Phone: [phone] Fax: [phone]  [facility]) - [place], [place] - [address] [facility] [address] [place] [address] [place] [address] Phone: [phone] Fax: [phone]  [facility] Delivery - [place], [place] - [address] [place] [address] Phone: [phone] Fax: [phone]     Social Determinants of Health (SDOH) Social History: SDOH Screenings   Food Insecurity: No Food Insecurity (12/18/2022)  Housing: Patient Unable To Answer (12/18/2022)  Transportation Needs: No Transportation Needs (12/18/2022)  Utilities: Not At Risk (12/18/2022)  Social Connections: Unknown (05/27/2021)   Received from [facility]  Tobacco Use: Low Risk  (12/15/2022)   SDOH Interventions: Food Insecurity Interventions: Intervention Not Indicated Housing Interventions: Intervention Not Indicated Transportation  Interventions: Intervention Not Indicated   Readmission Risk Interventions     No data to display

## 2022-12-18 NOTE — Progress Notes (Signed)
Physical Therapy Treatment Patient Details Name: Alexis Wheeler MRN: [id number] DOB: November 29, 1955 Today's Date: 12/18/2022   History of Present Illness 67 y/o female admitted 12/3 for redo sternotomy and bentall procedure in setting of severe AI. PMH: HTN, HLD, OSA, congestive dilated cardiomyopathy, DVT/PE, aortic dissection repair    PT Comments  Pt very pleasant, oriented and following commands. Pt limited by incontinent stool but progressing well with gait and able to walk in hall with return to bed for pacing wires to be pulled. Pt educated for sternal precautions, safety, transfers and gait progression. Pt will benefit from HHPT with friend assist at D/C.   HR 80 SPO2 90% on RA, 95% on 2L with gait 149/75    If plan is discharge home, recommend the following: Assistance with cooking/housework;Direct supervision/assist for medications management;Direct supervision/assist for financial management;Assist for transportation;Help with stairs or ramp for entrance;Supervision due to cognitive status;A little help with walking and/or transfers;A little help with bathing/dressing/bathroom   Can travel by private vehicle        Equipment Recommendations  Rolling walker (2 wheels)    Recommendations for Other Services       Precautions / Restrictions Precautions Precautions: Fall;Sternal Precaution Comments: external pacer, monitor O2 Restrictions Weight Bearing Restrictions: Yes (sternal)     Mobility  Bed Mobility Overal bed mobility: Needs Assistance Bed Mobility: Sit to Supine, Rolling Rolling: Supervision         General bed mobility comments: min assist to clear legs onto surface, supervision to roll for pericare    Transfers Overall transfer level: Needs assistance   Transfers: Sit to/from Stand Sit to Stand: Min assist Stand pivot transfers: Contact guard assist         General transfer comment: min assist to stand from recliner and from [facility] x 2, cues for  hand placement and assist to power up to standing and stabilize. CGA to pivot bed to chair with RW    Ambulation/Gait Ambulation/Gait assistance: Contact guard assist Gait Distance (Feet): 150 Feet Assistive device: Rolling walker (2 wheels) Gait Pattern/deviations: Step-through pattern, Decreased stride length   Gait velocity interpretation: <1.8 ft/sec, indicate of risk for recurrent falls   General Gait Details: pt with cues to step into RW and for posture, good tolerance for gait on 2L with inconsistent pleth reading   Stairs             Wheelchair Mobility     Tilt Bed    Modified Rankin (Stroke Patients Only)       Balance Overall balance assessment: Needs assistance Sitting-balance support: No upper extremity supported, Feet supported Sitting balance-Leahy Scale: Fair     Standing balance support: Bilateral upper extremity supported, During functional activity Standing balance-Leahy Scale: Poor Standing balance comment: reliant on RW                            Cognition Arousal: Alert Behavior During Therapy: WFL for tasks assessed/performed Overall Cognitive Status: Within Functional Limits for tasks assessed                                 General Comments: recalling 2/4 precautions with education for all        Exercises      General Comments        Pertinent Vitals/Pain Pain Assessment Pain Assessment: No/denies pain    Home Living  Prior Function            PT Goals (current goals can now be found in the care plan section) Progress towards PT goals: Progressing toward goals    Frequency    Min 1X/week      PT Plan      Co-evaluation              AM-PAC PT "6 Clicks" Mobility   Outcome Measure  Help needed turning from your back to your side while in a flat bed without using bedrails?: A Little Help needed moving from lying on your back to sitting on  the side of a flat bed without using bedrails?: A Little Help needed moving to and from a bed to a chair (including a wheelchair)?: A Little Help needed standing up from a chair using your arms (e.g., wheelchair or bedside chair)?: A Little Help needed to walk in hospital room?: A Little Help needed climbing 3-5 steps with a railing? : A Little 6 Click Score: 18    End of Session Equipment Utilized During Treatment: Oxygen Activity Tolerance: Patient tolerated treatment well Patient left: in bed;with call bell/phone within reach;with nursing/sitter in room Nurse Communication: Mobility status PT Visit Diagnosis: Unsteadiness on feet (R26.81);Muscle weakness (generalized) (M62.81);Difficulty in walking, not elsewhere classified (R26.2);Pain     Time: 4696-2952 PT Time Calculation (min) (ACUTE ONLY): 29 min  Charges:    $Gait Training: 8-22 mins $Therapeutic Activity: 8-22 mins PT General Charges $$ ACUTE PT VISIT: 1 Visit                     Merryl Hacker, PT Acute Rehabilitation Services Office: [phone]    Enedina Finner Coran Dipaola 12/18/2022, 9:53 AM

## 2022-12-19 LAB — BPAM RBC
Blood Product Expiration Date: 202412212359
Blood Product Expiration Date: 202412212359
Blood Product Expiration Date: 202412212359
Blood Product Expiration Date: 202412212359
Blood Product Expiration Date: 202412222359
Blood Product Expiration Date: 202412302359
ISSUE DATE / TIME: 202412030731
ISSUE DATE / TIME: 202412030731
ISSUE DATE / TIME: 202412030731
ISSUE DATE / TIME: 202412041803
Unit Type and Rh: 6200
Unit Type and Rh: 6200
Unit Type and Rh: 6200
Unit Type and Rh: 6200
Unit Type and Rh: 6200
Unit Type and Rh: 6200

## 2022-12-19 LAB — TYPE AND SCREEN
ABO/RH(D): A POS
Antibody Screen: NEGATIVE
Unit division: 0
Unit division: 0
Unit division: 0
Unit division: 0
Unit division: 0
Unit division: 0

## 2022-12-19 LAB — CBC
HCT: 29.5 % — ABNORMAL LOW (ref 36.0–46.0)
Hemoglobin: 9.6 g/dL — ABNORMAL LOW (ref 12.0–15.0)
MCH: 25 pg — ABNORMAL LOW (ref 26.0–34.0)
MCHC: 32.5 g/dL (ref 30.0–36.0)
MCV: 76.8 fL — ABNORMAL LOW (ref 80.0–100.0)
Platelets: 84 10*3/uL — ABNORMAL LOW (ref 150–400)
RBC: 3.84 MIL/uL — ABNORMAL LOW (ref 3.87–5.11)
RDW: 18.3 % — ABNORMAL HIGH (ref 11.5–15.5)
WBC: 13 10*3/uL — ABNORMAL HIGH (ref 4.0–10.5)
nRBC: 0.2 % (ref 0.0–0.2)

## 2022-12-19 LAB — BASIC METABOLIC PANEL
Anion gap: 11 (ref 5–15)
BUN: 13 mg/dL (ref 8–23)
CO2: 33 mmol/L — ABNORMAL HIGH (ref 22–32)
Calcium: 7.7 mg/dL — ABNORMAL LOW (ref 8.9–10.3)
Chloride: 99 mmol/L (ref 98–111)
Creatinine, Ser: 0.95 mg/dL (ref 0.44–1.00)
GFR, Estimated: >60 mL/min (ref >60)
Glucose, Bld: 112 mg/dL — ABNORMAL HIGH (ref 70–99)
Potassium: 3.3 mmol/L — ABNORMAL LOW (ref 3.5–5.1)
Sodium: 143 mmol/L (ref 135–145)

## 2022-12-19 LAB — GLUCOSE, CAPILLARY
Glucose-Capillary: 104 mg/dL — ABNORMAL HIGH (ref 70–99)
Glucose-Capillary: 119 mg/dL — ABNORMAL HIGH (ref 70–99)

## 2022-12-19 MED ORDER — TORSEMIDE 20 MG PO TABS
20.0000 mg | ORAL_TABLET | Freq: Every day | ORAL | Status: DC
  Administered 2022-12-19: 20 mg via ORAL
  Filled 2022-12-19: qty 1

## 2022-12-19 MED ORDER — POTASSIUM CHLORIDE CRYS ER 20 MEQ PO TBCR: 20.0000 meq | EXTENDED_RELEASE_TABLET | Freq: Two times a day (BID) | ORAL | Status: DC

## 2022-12-19 MED ORDER — APIXABAN 5 MG PO TABS: 5.0000 mg | ORAL_TABLET | Freq: Two times a day (BID) | ORAL | Status: DC

## 2022-12-19 MED ORDER — POTASSIUM CHLORIDE CRYS ER 20 MEQ PO TBCR
20.0000 meq | EXTENDED_RELEASE_TABLET | ORAL | Status: DC
  Administered 2022-12-19: 20 meq via ORAL
  Filled 2022-12-19: qty 1

## 2022-12-19 MED ORDER — POTASSIUM CHLORIDE CRYS ER 20 MEQ PO TBCR
60.0000 meq | EXTENDED_RELEASE_TABLET | Freq: Once | ORAL | Status: AC
  Administered 2022-12-19: 60 meq via ORAL
  Filled 2022-12-19: qty 3

## 2022-12-19 NOTE — Progress Notes (Signed)
[address]       [place] [address]             [phone]      4 Days Post-Op  Procedure(s) (LRB): REDO STERNOTOMY (N/A) BENTALL PROCEDURE USING 23 MM KONECT RESILIA AORTIC VALVED CONDUIT (N/A) TRANSESOPHAGEAL ECHOCARDIOGRAM (TEE) (N/A)   Total Length of Stay:  LOS: 4 days    SUBJECTIVE: Feels well Doesn't like hospital food  Vitals:   12/19/22 0700 12/19/22 0730  BP: (!) 143/75   Pulse: 76   Resp: 16   Temp:  98.1 F (36.7 C)  SpO2: 97%     Intake/Output      12/06 0701 12/07 0700 12/07 0701 12/08 0700   P.O. 480 120   Other     IV Piggyback 301.3    Total Intake(mL/kg) 781.3 (8) 120 (1.2)   Urine (mL/kg/hr) 5600 (2.4)    Stool 0    Chest Tube     Total Output 5600    Net -4818.7 +120        Stool Occurrence 2 x         CBC    Component Value Date/Time   WBC 13.0 (H) 12/19/2022 0251   RBC 3.84 (L) 12/19/2022 0251   HGB 9.6 (L) 12/19/2022 0251   HGB 13.2 09/22/2022 0952   HCT 29.5 (L) 12/19/2022 0251   HCT 43.2 09/22/2022 0952   PLT 84 (L) 12/19/2022 0251   PLT 217 09/22/2022 0952   MCV 76.8 (L) 12/19/2022 0251   MCV 76 (L) 09/22/2022 0952   MCH 25.0 (L) 12/19/2022 0251   MCHC 32.5 12/19/2022 0251   RDW 18.3 (H) 12/19/2022 0251   RDW 15.8 (H) 09/22/2022 0952   LYMPHSABS 2.3 10/20/2022 1127   MONOABS 0.6 10/20/2022 1127   EOSABS 0.3 10/20/2022 1127   BASOSABS 0.0 10/20/2022 1127   CMP     Component Value Date/Time   NA 143 12/19/2022 0251   NA 144 10/23/2022 1232   K 3.3 (L) 12/19/2022 0251   CL 99 12/19/2022 0251   CO2 33 (H) 12/19/2022 0251   GLUCOSE 112 (H) 12/19/2022 0251   BUN 13 12/19/2022 0251   BUN 13 10/23/2022 1232   CREATININE 0.95 12/19/2022 0251   CREATININE 0.86 09/04/2022 1046   CALCIUM 7.7 (L) 12/19/2022 0251   PROT 5.2 (L) 12/18/2022 1941   PROT 6.5 09/06/2018 1109   ALBUMIN 2.5 (L) 12/18/2022 1941   ALBUMIN 4.3 09/06/2018 1109   AST 12 (L) 12/18/2022 1941   ALT 7 12/18/2022 1941    ALKPHOS 47 12/18/2022 1941   BILITOT 0.5 12/18/2022 1941   BILITOT 0.4 09/06/2018 1109   GFRNONAA >60 12/19/2022 0251   GFRNONAA 58 (L) 05/23/2020 0000   GFRAA 68 05/23/2020 0000   ABG    Component Value Date/Time   PHART 7.306 (L) 12/16/2022 0645   PCO2ART 50.4 (H) 12/16/2022 0645   PO2ART 71 (L) 12/16/2022 0645   HCO3 25.3 12/16/2022 0645   TCO2 27 12/16/2022 0645   ACIDBASEDEF 2.0 12/16/2022 0645   O2SAT 93 12/16/2022 0645   CBG (last 3)  Recent Labs    12/16/22 2013 12/16/22 2329 12/17/22 0426  GLUCAP 110* 113* 119*  EXAM Lungs: clear Card: rr without murmur Ext: warm and no edema Neuro: intact   ASSESSMENT: POD #4 SP redo bental procedure Hemodynamics good Ambulating Will transfer to floor Start xeralto   Eugenio Hoes, MD 12/19/2022

## 2022-12-20 LAB — GLUCOSE, CAPILLARY
Glucose-Capillary: 112 mg/dL — ABNORMAL HIGH (ref 70–99)
Glucose-Capillary: 148 mg/dL — ABNORMAL HIGH (ref 70–99)

## 2022-12-20 LAB — BASIC METABOLIC PANEL
Anion gap: 9 (ref 5–15)
BUN: 9 mg/dL (ref 8–23)
CO2: 32 mmol/L (ref 22–32)
Calcium: 8.3 mg/dL — ABNORMAL LOW (ref 8.9–10.3)
Chloride: 101 mmol/L (ref 98–111)
Creatinine, Ser: 0.82 mg/dL (ref 0.44–1.00)
GFR, Estimated: >60 mL/min (ref >60)
Glucose, Bld: 111 mg/dL — ABNORMAL HIGH (ref 70–99)
Potassium: 3.3 mmol/L — ABNORMAL LOW (ref 3.5–5.1)
Sodium: 142 mmol/L (ref 135–145)

## 2022-12-20 MED ORDER — AMLODIPINE BESYLATE 10 MG PO TABS
10.0000 mg | ORAL_TABLET | Freq: Every day | ORAL | Status: DC
Start: 2022-12-20 — End: 2022-12-22
  Administered 2022-12-20 – 2022-12-22 (×3): 10 mg via ORAL
  Filled 2022-12-20 (×3): qty 1

## 2022-12-20 MED ORDER — LOSARTAN POTASSIUM 50 MG PO TABS
50.0000 mg | ORAL_TABLET | Freq: Every day | ORAL | Status: DC
  Administered 2022-12-20: 50 mg via ORAL
  Filled 2022-12-20: qty 1

## 2022-12-20 MED ORDER — POTASSIUM CHLORIDE CRYS ER 20 MEQ PO TBCR
40.0000 meq | EXTENDED_RELEASE_TABLET | Freq: Two times a day (BID) | ORAL | Status: AC
  Administered 2022-12-20 (×2): 40 meq via ORAL
  Filled 2022-12-20 (×2): qty 2

## 2022-12-20 NOTE — Progress Notes (Addendum)
Pt ambulated approximately 162ft, front wheel walker, room air.  Tolerated well. Back to recliner in room, call bell in reach.

## 2022-12-20 NOTE — Progress Notes (Signed)
5 Days Post-Op Procedure(s) (LRB): REDO STERNOTOMY (N/A) BENTALL PROCEDURE USING 23 MM KONECT RESILIA AORTIC VALVED CONDUIT (N/A) TRANSESOPHAGEAL ECHOCARDIOGRAM (TEE) (N/A) Subjective: Feels well  Objective: Vital signs in last 24 hours: Temp:  [98.1 F (36.7 C)-98.5 F (36.9 C)] 98.5 F (36.9 C) (12/08 0502) Pulse Rate:  [51-90] 72 (12/08 0502) Cardiac Rhythm: Normal sinus rhythm (12/07 2100) Resp:  [12-33] 15 (12/08 0502) BP: (125-157)/(42-110) 133/83 (12/08 0502) SpO2:  [93 %-100 %] 93 % (12/08 0502)  Hemodynamic parameters for last 24 hours:    Intake/Output from previous day: 12/07 0701 - 12/08 0700 In: 493 [P.O.:480; I.V.:13] Out: 300 [Urine:300] Intake/Output this shift: No intake/output data recorded.  General appearance: alert, cooperative, and no distress Heart: regular rate and rhythm Lungs: clear to auscultation bilaterally Abdomen: benign Extremities: no edema Wound: incis healing well  Lab Results: Recent Labs    12/18/22 0604 12/19/22 0251  WBC 15.5* 13.0*  HGB 10.5* 9.6*  HCT 32.1* 29.5*  PLT 81* 84*   BMET:  Recent Labs    12/19/22 0251 12/20/22 0324  NA 143 142  K 3.3* 3.3*  CL 99 101  CO2 33* 32  GLUCOSE 112* 111*  BUN 13 9  CREATININE 0.95 0.82  CALCIUM 7.7* 8.3*    PT/INR: No results for input(s): "LABPROT", "INR" in the last 72 hours. ABG    Component Value Date/Time   PHART 7.306 (L) 12/16/2022 0645   HCO3 25.3 12/16/2022 0645   TCO2 27 12/16/2022 0645   ACIDBASEDEF 2.0 12/16/2022 0645   O2SAT 93 12/16/2022 0645   CBG (last 3)  Recent Labs    12/19/22 1203 12/19/22 1712  GLUCAP 104* 119*    Meds Scheduled Meds:  acetaminophen  1,000 mg Oral Q6H   Or   acetaminophen (TYLENOL) oral liquid 160 mg/5 mL  1,000 mg Per Tube Q6H   apixaban  5 mg Oral BID   aspirin EC  81 mg Oral Daily   atorvastatin  40 mg Oral QPM   Chlorhexidine Gluconate Cloth  6 each Topical Daily   docusate sodium  200 mg Oral Daily   folic  acid  1 mg Oral Daily   metoprolol tartrate  12.5 mg Oral BID   mouth rinse  15 mL Mouth Rinse TID PC   pantoprazole  40 mg Oral Daily   potassium chloride  40 mEq Oral BID   sodium chloride flush  3 mL Intravenous Q12H   Continuous Infusions: PRN Meds:.ondansetron (ZOFRAN) IV, mouth rinse, sodium chloride flush, traMADol  Xrays No results found.  Assessment/Plan: S/P Procedure(s) (LRB): REDO STERNOTOMY (N/A) BENTALL PROCEDURE USING 23 MM KONECT RESILIA AORTIC VALVED CONDUIT (N/A) TRANSESOPHAGEAL ECHOCARDIOGRAM (TEE) (N/A) POD#5  1 afeb, VSS, s BP 120's-150's, SR, apixaban started, will resume norvasc and  ARB- lower dose to start 2 O2 sats good on RA 3 voiding- not all measured, not weighed yet-she does not appear to be clinically volume overloaded 4 BS well controlled 5 hypokalemia- replace for 3.3- has been ordered 6 normal renal fxn 7 cont pulm hygiene and rehab, likely home 1-2 days   LOS: 5 days    Rowe Clack PA-C Pager [phone] 12/20/2022

## 2022-12-20 NOTE — Progress Notes (Signed)
Mobility Specialist Progress Note    12/20/22 1204  Mobility  Activity Ambulated with assistance in hallway  Level of Assistance Contact guard assist, steadying assist  Assistive Device Front wheel walker  Distance Ambulated (ft) 120 ft  Activity Response Tolerated well  Mobility Referral Yes  Mobility visit 1 Mobility  Mobility Specialist Start Time (ACUTE ONLY) 1155  Mobility Specialist Stop Time (ACUTE ONLY) 1204  Mobility Specialist Time Calculation (min) (ACUTE ONLY) 9 min   Pre-Mobility: 76 HR Post-Mobility: 80 HR, 150/70 (94) BP  Pt received in bed and agreeable. No complaints on walk. Returned to bed with call bell in reach.   Durbin Nation Mobility Specialist  Please Neurosurgeon or Rehab Office at [phone]

## 2022-12-21 LAB — BASIC METABOLIC PANEL
Anion gap: 7 (ref 5–15)
BUN: 8 mg/dL (ref 8–23)
CO2: 29 mmol/L (ref 22–32)
Calcium: 8.2 mg/dL — ABNORMAL LOW (ref 8.9–10.3)
Chloride: 103 mmol/L (ref 98–111)
Creatinine, Ser: 0.76 mg/dL (ref 0.44–1.00)
GFR, Estimated: >60 mL/min (ref >60)
Glucose, Bld: 115 mg/dL — ABNORMAL HIGH (ref 70–99)
Potassium: 3.5 mmol/L (ref 3.5–5.1)
Sodium: 139 mmol/L (ref 135–145)

## 2022-12-21 MED ORDER — CARVEDILOL 6.25 MG PO TABS
6.2500 mg | ORAL_TABLET | Freq: Two times a day (BID) | ORAL | Status: DC
  Administered 2022-12-21 – 2022-12-22 (×3): 6.25 mg via ORAL
  Filled 2022-12-21 (×3): qty 1

## 2022-12-21 MED ORDER — LOSARTAN POTASSIUM 50 MG PO TABS
100.0000 mg | ORAL_TABLET | Freq: Every day | ORAL | Status: DC
  Administered 2022-12-21 – 2022-12-22 (×2): 100 mg via ORAL
  Filled 2022-12-21 (×2): qty 2

## 2022-12-21 NOTE — Care Management Important Message (Signed)
Important Message  Patient Details  Name: Alexis Wheeler MRN: [id number] Date of Birth: [birth year]   Important Message Given:  Yes - Medicare IM     Sherilyn Banker 12/21/2022, 4:30 PM

## 2022-12-21 NOTE — Progress Notes (Signed)
Occupational Therapy Treatment Patient Details Name: Alexis Wheeler MRN: [id number] DOB: Aug 17, 1955 Today's Date: 12/21/2022   History of present illness 67 y/o female admitted 12/3 for redo sternotomy and bentall procedure in setting of severe AI. PMH: HTN, HLD, OSA, congestive dilated cardiomyopathy, DVT/PE, aortic dissection repair   OT comments  Pt making excellent progress towards OT goals. Pt able to mobilize without AD into hallway and without LOB. Pt has also been managing toileting tasks and bathing tasks without assist. Intermittent cues for sternal precautions beneficial w/ pt quickly correcting once cued. Pt/family deny ADL concerns. No OT follow up needed at DC.       If plan is discharge home, recommend the following:  Assistance with cooking/housework;Assist for transportation   Equipment Recommendations  None recommended by OT    Recommendations for Other Services      Precautions / Restrictions Precautions Precautions: Fall;Sternal Precaution Booklet Issued: Yes (comment) Restrictions Weight Bearing Restrictions: No       Mobility Bed Mobility Overal bed mobility: Needs Assistance Bed Mobility: Supine to Sit     Supine to sit: Supervision, HOB elevated     General bed mobility comments: quickly sat EOB    Transfers Overall transfer level: Independent Equipment used: None Transfers: Sit to/from [employer] transfer comment: able to easily stand without AD     Balance Overall balance assessment: Needs assistance Sitting-balance support: No upper extremity supported, Feet supported Sitting balance-Leahy Scale: Good     Standing balance support: No upper extremity supported, During functional activity Standing balance-Leahy Scale: Good Standing balance comment: able to mobilize without AD and without LOB                           ADL either performed or assessed with clinical judgement   ADL Overall ADL's :  Needs assistance/impaired                 Upper Body Dressing : Supervision/safety   Lower Body Dressing: Supervision/safety               Functional mobility during ADLs: Contact guard assist;Supervision/safety General ADL Comments: Pt reports just ambulating from bathroom, has been washing self up and daughter confirms. Able to mobilize in hallway without AD (w/ cardiac rehab taking over for walk). Reinforced using pillow for bed mobility to maximize sternal precaution maintenance.    Extremity/Trunk Assessment Upper Extremity Assessment Upper Extremity Assessment: Overall WFL for tasks assessed;Right hand dominant   Lower Extremity Assessment Lower Extremity Assessment: Defer to PT evaluation        Vision   Vision Assessment?: No apparent visual deficits   Perception     Praxis      Cognition Arousal: Alert Behavior During Therapy: WFL for tasks assessed/performed Overall Cognitive Status: Within Functional Limits for tasks assessed                                          Exercises      Shoulder Instructions       General Comments Daughter and niece at bedside    Pertinent Vitals/ Pain       Pain Assessment Pain Assessment: No/denies pain  Home Living  Prior Functioning/Environment              Frequency  Min 1X/week        Progress Toward Goals  OT Goals(current goals can now be found in the care plan section)  Progress towards OT goals: Progressing toward goals  Acute Rehab OT Goals Patient Stated Goal: home tomorrow OT Goal Formulation: With patient/family Time For Goal Achievement: 12/31/22 Potential to Achieve Goals: Good ADL Goals Pt Will Perform Lower Body Bathing: with supervision;sit to/from stand;sitting/lateral leans Pt Will Perform Lower Body Dressing: with supervision;sitting/lateral leans;sit to/from stand Pt Will Transfer to Toilet:  with supervision;ambulating Pt Will Perform Toileting - Clothing Manipulation and hygiene: with supervision;sit to/from stand;sitting/lateral leans  Plan      Co-evaluation                 AM-PAC OT "6 Clicks" Daily Activity     Outcome Measure   Help from another person eating meals?: None Help from another person taking care of personal grooming?: None Help from another person toileting, which includes using toliet, bedpan, or urinal?: A Little Help from another person bathing (including washing, rinsing, drying)?: A Little Help from another person to put on and taking off regular upper body clothing?: A Little Help from another person to put on and taking off regular lower body clothing?: A Little 6 Click Score: 20    End of Session Equipment Utilized During Treatment: Gait belt  OT Visit Diagnosis: Unsteadiness on feet (R26.81);Other abnormalities of gait and mobility (R26.89);Muscle weakness (generalized) (M62.81)   Activity Tolerance Patient tolerated treatment well   Patient Left in bed;with call bell/phone within reach;with family/visitor present;Other (comment) (with cardiac rehab)   Nurse Communication Mobility status        Time: [phone] OT Time Calculation (min): 10 min  Charges: OT General Charges $OT Visit: 1 Visit OT Treatments $Self Care/Home Management : 8-22 mins  Bradd Canary, OTR/L Acute Rehab Services Office: [phone]   Lorre Munroe 12/21/2022, 10:18 AM

## 2022-12-21 NOTE — Progress Notes (Signed)
Mobility Specialist Progress Note:   12/21/22 1506  Mobility  Activity Ambulated with assistance in hallway  Level of Assistance Contact guard assist, steadying assist  Assistive Device None  Distance Ambulated (ft) 400 ft  RUE Weight Bearing NWB  LUE Weight Bearing NWB  Activity Response Tolerated well  Mobility Referral Yes  Mobility visit 1 Mobility  Mobility Specialist Start Time (ACUTE ONLY) 1420  Mobility Specialist Stop Time (ACUTE ONLY) 1430  Mobility Specialist Time Calculation (min) (ACUTE ONLY) 10 min   Pre Mobility: 84 HR  During Mobility: 102 HR   Pt received in bed, agreeable to mobility. SB to stand within precautions. CG during ambulation. 1x standing rest break required d/t slight fatigue and chest pain. VSS. Pt reaching out for hand rails during ambulation but no LOB or unsteadiness present. Pt returned to bed with call bell in reach and all needs met.  Leory Plowman  Mobility Specialist Please contact via [employer] office at [phone]

## 2022-12-21 NOTE — Progress Notes (Addendum)
[address]       [place] [address]             [phone]      6 Days Post-Op Procedure(s) (LRB): REDO STERNOTOMY (N/A) BENTALL PROCEDURE USING 23 MM KONECT RESILIA AORTIC VALVED CONDUIT (N/A) TRANSESOPHAGEAL ECHOCARDIOGRAM (TEE) (N/A) Subjective: Resting in bed, had some vague abdominal discomfort last night that has resolved.  Otherwise no complaints. Said she is able to get out of bed independently and walks in the hall with no trouble.  Bm yesterday.  On RA with stable sats.   Objective: Vital signs in last 24 hours: Temp:  [98 F (36.7 C)-99.5 F (37.5 C)] 98.9 F (37.2 C) (12/09 0742) Pulse Rate:  [73-88] 73 (12/09 0742) Cardiac Rhythm: Normal sinus rhythm (12/09 0716) Resp:  [14-20] 14 (12/09 0742) BP: (127-150)/(70-84) 140/80 (12/09 0742) SpO2:  [94 %-100 %] 97 % (12/09 0742) Weight:  [89.1 kg] 89.1 kg (12/09 0537)    Intake/Output from previous day: 12/08 0701 - 12/09 0700 In: 480 [P.O.:480] Out: -  Intake/Output this shift: No intake/output data recorded.  General appearance: alert, cooperative, and no distress Heart: regular rate and rhythm Lungs: clear to auscultation bilaterally Abdomen: benign Extremities: no peripheral edema Wound: incisions intact and dry.   Lab Results: Recent Labs    12/19/22 0251  WBC 13.0*  HGB 9.6*  HCT 29.5*  PLT 84*   BMET:  Recent Labs    12/20/22 0324 12/21/22 0314  NA 142 139  K 3.3* 3.5  CL 101 103  CO2 32 29  GLUCOSE 111* 115*  BUN 9 8  CREATININE 0.82 0.76  CALCIUM 8.3* 8.2*    PT/INR: No results for input(s): "LABPROT", "INR" in the last 72 hours. ABG    Component Value Date/Time   PHART 7.306 (L) 12/16/2022 0645   HCO3 25.3 12/16/2022 0645   TCO2 27 12/16/2022 0645   ACIDBASEDEF 2.0 12/16/2022 0645   O2SAT 93 12/16/2022 0645   CBG (last 3)  Recent Labs    12/19/22 1712 12/20/22 1158 12/20/22 1608  GLUCAP 119* 112* 148*    Meds Scheduled Meds:  amLODipine   10 mg Oral Daily   apixaban  5 mg Oral BID   aspirin EC  81 mg Oral Daily   atorvastatin  40 mg Oral QPM   docusate sodium  200 mg Oral Daily   folic acid  1 mg Oral Daily   losartan  50 mg Oral Daily   metoprolol tartrate  12.5 mg Oral BID   mouth rinse  15 mL Mouth Rinse TID PC   pantoprazole  40 mg Oral Daily   sodium chloride flush  3 mL Intravenous Q12H   Continuous Infusions: PRN Meds:.ondansetron (ZOFRAN) IV, mouth rinse, sodium chloride flush, traMADol  Xrays No results found.  Assessment/Plan: S/P Procedure(s) (LRB): REDO STERNOTOMY (N/A) BENTALL PROCEDURE USING 23 MM KONECT RESILIA AORTIC VALVED CONDUIT (N/A) TRANSESOPHAGEAL ECHOCARDIOGRAM (TEE) (N/A)  -POD#5-progressing well.  Stable cardiac rhythm.  BP in the 140's  with MAP ~98 on lower dosing of her Norvasc and losartan. Will advance to her normal dosing.   -History of DVT with large PE- Xarelto has been resumed.   -HEME- Last Hct on 12/7 ~30% and Plt count was trending up  -ENDO- no h/o DM.  -RENAL- normal function, near pre-op Wt.   GI- toleratine PO's, BM yesterday.   -Disposition- Walking in the hall with a rolling walker but only ~131ft  at a time.  May need to work on mobility for another day before discharging. Will discuss with Dr. Laneta Simmers.     LOS: 6 days    Leary Roca PA-C Pager [phone] 12/21/2022   Chart reviewed, patient examined, agree with above. She looks good overall. If she walks more today I think she can go home tomorrow.

## 2022-12-21 NOTE — Progress Notes (Signed)
CARDIAC REHAB PHASE I   PRE:  Rate/Rhythm: up with OT    BP: sitting     SpO2:   MODE:  Ambulation: 400 ft   POST:  Rate/Rhythm: 102 ST    BP: sitting 161/99     SpO2: 97 RA  Pt walking with OT, I took over. No AD, slight stagger at times, contact guard with gait belt. Pt without c/o walking, return to EOB. BP elevated.   Daughter concerned with pts ability to get out of bed therefore practiced bed mobility. Pt able to do independently. Discussed pt remembering sternal precautions and using heart pillow to remind her.  Discussed IS (currently 750 ml), sternal precautions, walking at home, [facility] diet, and CRPII. Pt and daughter receptive. Will refer to [place] CRPII.  [id number]   Ethelda Chick BS, ACSM-CEP 12/21/2022 10:28 AM

## 2022-12-22 ENCOUNTER — Other Ambulatory Visit (HOSPITAL_COMMUNITY): Payer: Self-pay

## 2022-12-22 MED ORDER — TRAMADOL HCL 50 MG PO TABS
50.0000 mg | ORAL_TABLET | Freq: Four times a day (QID) | ORAL | 0 refills | Status: AC | PRN
  Filled 2022-12-22: qty 28, 7d supply, fill #0

## 2022-12-22 NOTE — Progress Notes (Signed)
CARDIAC REHAB PHASE I   Pt ready for discharge home. [facility] education completed. CRP2 referral sent to [facility].   [phone]  Woodroe Chen, RN BSN 12/22/2022 9:58 AM

## 2022-12-22 NOTE — Progress Notes (Signed)
Pt has completed working with PT, pt dressed, daughter not here yet. Pt attempted to call daughter but no answer.  Discharge lounge transport contacted to pick pt up and take to the discharge lounge.  Kalyssa Anker,RN SWOT

## 2022-12-22 NOTE — Progress Notes (Signed)
Patient given discharge instructions, medication list and follow up appointments. Patient IV already removed. Telemetry removed. All questions were answered. PT in room to work with patient prior to discharge. Will discharge home as ordered. Nailani Full, Randall An RN

## 2022-12-22 NOTE — Progress Notes (Addendum)
[address]       [employer] [address]             [phone]      7 Days Post-Op Procedure(s) (LRB): REDO STERNOTOMY (N/A) BENTALL PROCEDURE USING 23 MM KONECT RESILIA AORTIC VALVED CONDUIT (N/A) TRANSESOPHAGEAL ECHOCARDIOGRAM (TEE) (N/A) Subjective: Had a better day yesterday, walked 400' x 2. No new concerns. Feels she is ready to return home today.  On RA with stable sats.   Objective: Vital signs in last 24 hours: Temp:  [98.7 F (37.1 C)-99.3 F (37.4 C)] 98.7 F (37.1 C) (12/10 0400) Pulse Rate:  [73-89] 79 (12/10 0400) Cardiac Rhythm: Normal sinus rhythm (12/09 1900) Resp:  [12-20] 18 (12/10 0400) BP: (132-144)/(72-80) 141/75 (12/10 0400) SpO2:  [95 %-100 %] 100 % (12/10 0400) Weight:  [89 kg] 89 kg (12/10 0400)    Intake/Output from previous day: 12/09 0701 - 12/10 0700 In: 118 [P.O.:118] Out: -  Intake/Output this shift: No intake/output data recorded.  General appearance: alert, cooperative, and no distress Heart: regular rate and rhythm Lungs: clear to auscultation bilaterally Abdomen: soft, no tenderness Extremities: no peripheral edema Wound: incisions intact and dry.   Lab Results: No results for input(s): "WBC", "HGB", "HCT", "PLT" in the last 72 hours.  BMET:  Recent Labs    12/20/22 0324 12/21/22 0314  NA 142 139  K 3.3* 3.5  CL 101 103  CO2 32 29  GLUCOSE 111* 115*  BUN 9 8  CREATININE 0.82 0.76  CALCIUM 8.3* 8.2*    PT/INR: No results for input(s): "LABPROT", "INR" in the last 72 hours. ABG    Component Value Date/Time   PHART 7.306 (L) 12/16/2022 0645   HCO3 25.3 12/16/2022 0645   TCO2 27 12/16/2022 0645   ACIDBASEDEF 2.0 12/16/2022 0645   O2SAT 93 12/16/2022 0645   CBG (last 3)  Recent Labs    12/19/22 1712 12/20/22 1158 12/20/22 1608  GLUCAP 119* 112* 148*    Meds Scheduled Meds:  amLODipine  10 mg Oral Daily   apixaban  5 mg Oral BID   aspirin EC  81 mg Oral Daily   atorvastatin  40 mg  Oral QPM   carvedilol  6.25 mg Oral BID WC   docusate sodium  200 mg Oral Daily   folic acid  1 mg Oral Daily   losartan  100 mg Oral Daily   mouth rinse  15 mL Mouth Rinse TID PC   pantoprazole  40 mg Oral Daily   sodium chloride flush  3 mL Intravenous Q12H   Continuous Infusions: PRN Meds:.ondansetron (ZOFRAN) IV, mouth rinse, sodium chloride flush, traMADol  Xrays No results found.  Assessment/Plan: S/P Procedure(s) (LRB): REDO STERNOTOMY (N/A) BENTALL PROCEDURE USING 23 MM KONECT RESILIA AORTIC VALVED CONDUIT (N/A) TRANSESOPHAGEAL ECHOCARDIOGRAM (TEE) (N/A)  -POD 7 -progressing well.  Stable cardiac rhythm.  BP trending lower with her regular dosing of BP meds. Now independent with mobility and transfers.   -History of DVT with large PE- Xarelto has been resumed.   -HEME- Last Hct on 12/7 ~30% and Plt count was trending up  -ENDO- no h/o DM.  -RENAL- normal function, now about 2lbs below pre-op Wt.   GI- toleratine PO's, BM yesterday.   -Disposition- ready for discharge to home. Arranged for rolling walker and HH PT.  Instructions given and follow up arranged.     LOS: 7 days    Leary Roca PA-C Pager [phone]  [phone] 12/22/2022   Chart reviewed, patient examined, agree with above. She looks good and ready for discharge this am.

## 2022-12-22 NOTE — TOC Transition Note (Signed)
Transition of Care (TOC) - CM/SW Discharge Note Donn Pierini RN, BSN Transitions of Care Unit 4E- RN Case Manager See Treatment Team for direct phone #   Patient Details  Name: Alexis Wheeler MRN: [id number] Date of Birth: 1955-07-22  Transition of Care [facility]) CM/SW Contact:  Darrold Span, RN Phone Number: 12/22/2022, 10:12 AM   Clinical Narrative:    Pt stable for transition home today, DME- RW delivered per [employer] to the room.  HH has been set up with TCTS office protocol referral for [facility] needs- liaison aware of discharge for today and will follow up for start of care (HHRN/PT).   Family to transport home, No further TOC needs noted   Final next level of care: Home w Home Health Services Barriers to Discharge: Barriers Resolved   Patient Goals and CMS Choice CMS Medicare.gov Compare Post Acute Care list provided to:: Patient Choice offered to / list presented to : Patient  Discharge Placement                 Home w/ [facility]        Discharge Plan and Services Additional resources added to the After Visit Summary for     Discharge Planning Services: CM Consult Post Acute Care Choice: Home Health          DME Arranged: Dan Humphreys rolling DME Agency: [facility] Date DME Agency Contacted: 12/21/22 Time DME Agency Contacted: 1000 Representative spoke with at DME Agency: Zollie Beckers HH Arranged: RN, PT [facility] Agency: [facility] Date HH Agency Contacted: 12/18/22 Time HH Agency Contacted: 1553 Representative spoke with at [facility] Agency: Clarise Cruz  Social Determinants of Health (SDOH) Interventions SDOH Screenings   Food Insecurity: No Food Insecurity (12/18/2022)  Housing: Patient Unable To Answer (12/18/2022)  Transportation Needs: No Transportation Needs (12/18/2022)  Utilities: Not At Risk (12/18/2022)  Social Connections: Unknown (05/27/2021)   [facility]  Tobacco Use: Low Risk  (12/15/2022)      Readmission Risk Interventions    12/22/2022   10:12 AM  Readmission Risk Prevention Plan  Transportation Screening Complete  Home Care Screening Complete  Medication Review (RN CM) Complete

## 2022-12-22 NOTE — Progress Notes (Signed)
Physical Therapy Treatment Patient Details Name: Alexis Wheeler MRN: [id number] DOB: 1955/05/24 Today's Date: 12/22/2022   History of Present Illness 67 y/o female admitted 12/3 for redo sternotomy and bentall procedure in setting of severe AI. PMH: HTN, HLD, OSA, congestive dilated cardiomyopathy, DVT/PE, aortic dissection repair    PT Comments  Pt very pleasant, eager to return home. Pt remains with difficulty recalling sternal precautions and able to state 2/4 throughout session despite repeated education. Pt needs min cues not to push with transfers and activity but progressing well with gait. HHPT remains appropriate. All education completed for D/C.   VSS    If plan is discharge home, recommend the following: Assistance with cooking/housework;Assist for transportation;A little help with bathing/dressing/bathroom   Can travel by private vehicle        Equipment Recommendations  None recommended by PT    Recommendations for Other Services       Precautions / Restrictions Precautions Precautions: Fall;Sternal Precaution Comments: educated for all precautions throughout session Restrictions Weight Bearing Restrictions:  (sternal) RUE Weight Bearing: Non weight bearing LUE Weight Bearing: Non weight bearing     Mobility  Bed Mobility Overal bed mobility: Modified Independent             General bed mobility comments: pt able to perform supine<>sit with bed flat without assist    Transfers Overall transfer level: Needs assistance   Transfers: Sit to/from Stand Sit to Stand: Supervision           General transfer comment: cues for hand placement not to push. pt able to perform 5 repeated STS from EOB    Ambulation/Gait Ambulation/Gait assistance: Supervision Gait Distance (Feet): 400 Feet Assistive device: Rolling walker (2 wheels) Gait Pattern/deviations: Step-through pattern, Decreased stride length   Gait velocity interpretation: 1.31 - 2.62  ft/sec, indicative of limited community ambulator   General Gait Details: pt with decreased stride but stable and steady with use of RW. Discussed all sternal precautions during activity. pt able to self regulate activity   Stairs             Wheelchair Mobility     Tilt Bed    Modified Rankin (Stroke Patients Only)       Balance Overall balance assessment: Needs assistance   Sitting balance-Leahy Scale: Good     Standing balance support: No upper extremity supported Standing balance-Leahy Scale: Good Standing balance comment: able to stand without support, RW for gait                            Cognition Arousal: Alert Behavior During Therapy: WFL for tasks assessed/performed Overall Cognitive Status: Impaired/Different from baseline Area of Impairment: Memory                     Memory: Decreased recall of precautions         General Comments: recalling 2/4 precautions with education for all        Exercises      General Comments        Pertinent Vitals/Pain Pain Assessment Pain Assessment: No/denies pain    Home Living                          Prior Function            PT Goals (current goals can now be found in the care plan section) Progress towards  PT goals: Goals met and updated - see care plan    Frequency    Min 1X/week      PT Plan      Co-evaluation              AM-PAC PT "6 Clicks" Mobility   Outcome Measure  Help needed turning from your back to your side while in a flat bed without using bedrails?: None Help needed moving from lying on your back to sitting on the side of a flat bed without using bedrails?: None Help needed moving to and from a bed to a chair (including a wheelchair)?: None Help needed standing up from a chair using your arms (e.g., wheelchair or bedside chair)?: None Help needed to walk in hospital room?: None Help needed climbing 3-5 steps with a railing? : A  Little 6 Click Score: 23    End of Session   Activity Tolerance: Patient tolerated treatment well Patient left: in bed;with call bell/phone within reach Nurse Communication: Mobility status PT Visit Diagnosis: Other abnormalities of gait and mobility (R26.89)     Time: 2956-2130 PT Time Calculation (min) (ACUTE ONLY): 18 min  Charges:    $Gait Training: 8-22 mins PT General Charges $$ ACUTE PT VISIT: 1 Visit                     Merryl Hacker, PT Acute Rehabilitation Services Office: [phone]    Cristine Polio 12/22/2022, 9:46 AM

## 2022-12-23 DIAGNOSIS — Z48812 Encounter for surgical aftercare following surgery on the circulatory system: Secondary | ICD-10-CM | POA: Diagnosis not present

## 2022-12-23 MED FILL — Albumin, Human Inj 5%: INTRAVENOUS | Qty: 250 | Status: AC

## 2022-12-23 MED FILL — Calcium Chloride Inj 10%: INTRAVENOUS | Qty: 10 | Status: AC

## 2022-12-23 MED FILL — Electrolyte-R (PH 7.4) Solution: INTRAVENOUS | Qty: 8000 | Status: AC

## 2022-12-23 MED FILL — Sodium Bicarbonate IV Soln 8.4%: INTRAVENOUS | Qty: 50 | Status: AC

## 2022-12-23 MED FILL — Heparin Sodium (Porcine) Inj 1000 Unit/ML: INTRAMUSCULAR | Qty: 40 | Status: AC

## 2022-12-23 MED FILL — Mannitol IV Soln 20%: INTRAVENOUS | Qty: 500 | Status: AC

## 2022-12-23 MED FILL — Sodium Chloride IV Soln 0.9%: INTRAVENOUS | Qty: 6000 | Status: AC

## 2022-12-23 NOTE — Progress Notes (Signed)
Cardiology Office Note:    Date:  01/04/2023   ID:  Alexis Wheeler, DOB [birth year], MRN [id number]  PCP:  [name], MD  Cardiologist:  [name], MD     Referring MD: [name], MD   Chief Complaint: hospital follow-up after [name] procedure  History of Present Illness:    Alexis Wheeler is a 67 y.o. 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 [name] and presents today for hospital follow-up after recent Bentall procedure.   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 She underwent replacement of the ascending aorta with resuspension of the aortic valve with a 30mm Hemashield Platinum graft. EF was mildly reduced at 40-45% on post-op Echo but later normalized. Cardiac catheterization in 2017 showed mild non-obstructive disease. She was admitted in 2018 with chest pain and chest CTA showed possible small PE vs scarring. She declined anticoagulation at that time. She also declined anticoagultaion when she was diagnosed with DVT in right peroneal vein in 2020. She was then admitted in 03/2020 with acute saddle PE with cor pulmonale. Lower extremity dopplers were positive for bilateral DVT. Echo during this admission showed LVEF of 50-55% with normal wall motion and  milldly enlarged RV with severe RV dysfunction. She was started on IV Heparin and then transitioned to  Eliquis. Repeat Echo in 07/2020 showed normalization of RV function.   She was seen by Dr. [name] in 08/2022 after not being seen for a couple of years.  She had recently been seen in the ED for chest pain. Echo and stress test were ordered for further evaluation. Echo showed LVEF of 55-60% with normal wall motion, mildly enlarged RV with normal RV function, moderate to severe AI, and chronic dissection in the descending aorta. ETT was inadequate due to poor exercise capacity. TEE in 09/2022 showed normal LV and RV function but severe dilatation of the aortic root measuring 48 mm and  moderate to severe eccentric AI Mechanism was prior dissection with persistent severely dilated sinuses especially of the left sinus with central failure to co apt. She underwent R/LHC in 10/2022 which showed minimal irregularities of the left but otherwise normal coronaries and normal filling pressures. She was referred to CT Surgery who recommended redo repair.  She presented to [facility] on 12/15/2022 and underwent redo sternotomy with Bentall procedure with a 23 mm Edwards Resilia Konect Valved Conduit and reimplantation of the left and right main coronary arteries. She was bradycardic following surgery and required AV pacing . She had expected post-op volume overloaded and was diuresed with Lasix. However, post-op course was overall unremarkable. She was restarted on Eliquis given history of PE/ DVT and on her antihypertensive medications prior to discharge. She was discharged on 12/22/2022.   Patient presents today for follow-up. Here alone.  She is recovering well from surgery. She describes some chest wall soreness, especially if she does not use the pillow  provided in the hospital when changing positions, but no real chest pain.  She denies any shortness of breath.  No orthopnea.  She did wake up gasping for air 2 nights ago but this was an isolated event.  This occurred on her first night home alone since her  surgery, so I wonder if this was a little bit of anxiety.  She denies any other episodes that sound like PND.  No edema.  No palpitations.  She felt a little unsteady when walking back into the office today but no real lightheadedness/dizziness.  No syncope.  She is compliant with all her medications and has been tolerating this well.  Of note, she is on both Aspirin and Eliquis. She states she has been on both for over 2 years and tolerates them well.  She denies any abnormal bleeding in her urine or stools.    EKGs/Labs/Other Studies Reviewed:    The following studies were reviewed:  TTE 09/08/2022: Impressions: 1. Left ventricular ejection fraction, by estimation, is 55 to 60%. The  left ventricle has normal function. The left ventricle has no regional  wall motion abnormalities. Left ventricular diastolic parameters are  indeterminate. The average left  ventricular global longitudinal strain is -19.1 %. The global longitudinal  strain is normal.   2. Right ventricular systolic function is normal. The right ventricular  size is mildly enlarged.   3. Left atrial size was mildly dilated.   4. The mitral valve is normal in structure. Trivial mitral valve  regurgitation. No evidence of mitral stenosis.   5. The aortic valve is tricuspid. Aortic valve regurgitation is moderate  to severe.   6. Chronic dissection seen in descending aorta. Aortic root/ascending  aorta has been repaired/replaced.   7. The inferior vena cava is normal in size with greater than 50%  respiratory variability, suggesting right atrial pressure of 3 mmHg.  _______________  TEE 09/30/2022: Impressions: 1. Left ventricular ejection fraction, by estimation, is 55 to 60%. The  left ventricle has normal function. The left ventricular internal cavity  size was mildly dilated. There is mild left ventricular hypertrophy.   2. Right ventricular systolic function is normal. The right ventricular  size is normal.   3.  Left atrial size was mildly dilated. No left atrial/left atrial  appendage thrombus was detected.   4. The mitral valve is normal in structure. Mild mitral valve  regurgitation.   5. 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 The aortic valve is tricuspid. Aortic valve  regurgitation is moderate to severe.   6. 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 No  communications to true lumen seen by color flow . Aortic dilatation noted.  There is severe dilatation of the  aortic root, measuring 48 mm.  _______________  Right/ Left Cardiac Catheterization 10/16/2022: LM: Minimal luminal irregularities LAD: No significant disease Lcx: No significant disease RCA: No significant disease   RA: 0 mmHg RV: 23/1 mmHg PA: 22/9 mmHg, mPAP 16 mmHg PCW: 4 mmHg   CO: 6.2 L/min CI: 3.2 L/min/m2\   No significant  coronary artery disease Normal filling pressures  Diagnostic Dominance: Right      EKG:  EKG ordered today.   EKG Interpretation Date/Time:  Monday January 04 2023 08:42:10 EST Ventricular Rate:  71 PR Interval:  192 QRS Duration:  96 QT Interval:  444 QTC Calculation: 482 R Axis:   58  Text Interpretation: Normal sinus rhythm Normal ECG When compared with ECG of 17-Dec-2022 23:04, Nonspecific T wave abnormality, improved in Inferior leads Nonspecific T wave abnormality no longer evident in Lateral leads QT has shortened Confirmed by Marjie Skiff [phone]) on 01/04/2023 8:46:50 AM    Recent Labs: 05/06/2022: TSH 1.16 12/18/2022: ALT 7; Magnesium 1.7 12/19/2022: Hemoglobin 9.6;  Platelets 84 12/21/2022: BUN 8; Creatinine, Ser 0.76; Potassium 3.5; Sodium 139  Recent Lipid Panel    Component Value Date/Time   CHOL 139 05/06/2022 0000   CHOL 146 09/06/2018 1109   TRIG 84 05/06/2022 0000   HDL 51 05/06/2022 0000   HDL 53 09/06/2018 1109   CHOLHDL 2.7 05/06/2022 0000   VLDL 27 12/24/2015 1059   LDLCALC 72 05/06/2022 0000    Physical Exam:    Vital Signs: BP 138/80   Pulse 71   Ht 5\' 3"  (1.6 m)   Wt 192 lb 9.6 oz (87.4 kg)   SpO2 96%   BMI 34.12 kg/m     Wt Readings from Last 3 Encounters:  01/04/23 192 lb 9.6 oz (87.4 kg)  12/22/22 196 lb 4.8 oz (89 kg)  12/14/22 198 lb 14.4 oz (90.2 kg)     General: 67 y.o. obese African-American female in no acute distress. HEENT: Normocephalic and atraumatic. Sclera clear.  Neck: Supple. No carotid bruits. No JVD. Heart:  RRR. Distinct S1 and S2. No murmurs, gallops, or rubs. Sternotomy and chest tube incisions healing well. Lungs: No increased work of breathing. Clear to ausculation bilaterally. No wheezes, rhonchi, or rales.  Abdomen: Soft, non-distended, and non-tender to palpation.  Extremities: No lower extremity edema.   Skin: Warm and dry. Neuro: No focal deficits. Psych: Normal affect. Responds appropriately.   Assessment:    1. Aortic valve insufficiency, etiology of cardiac valve disease unspecified   2. Hitory of dissection of ascending aorta (HCC)   3. S/p Bentall procedure   4. Coronary artery disease involving native coronary artery of native heart without angina pectoris   5. Primary hypertension   6. Hyperlipidemia, unspecified hyperlipidemia type   7. History of pulmonary embolus (PE)   8. 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 She underwent replacement of the ascending aorta with resuspension of the aortic valve with a 30mm Hemashield Platinum graft. TTE in 08/2022 showed moderate to severe aortic sufficiency. TEE in 09/2022 confirmed this and showed severe dilataiton of the aortic root measuring 48 mm. Mechanism was felt to be prior dissection with persistent severely dilated sinuses especially of the left sinus with central failure to co apt. She underwent redo sternotomy with Bentall procedure with a 23 mm Edwards Resilia Konect Valved Conduit and reimplantation of the left and right main coronary arteries on 12/15/2022.  - Recovering well. She has some chest wall soreness  but no real chest pain.  - Continue beta-blocker.  - Discussed that she will need SBE prophylaxis before any dental procedures in the future. - Follow-up with CT Surgery as scheduled. Routine post-op Echo is scheduled for 01/27/2022. - She has not had routine labs checked since surgery so will check a CBC and BMET today.   Of note, she does have one visible suture along her sternotomy incision. She had her other sutures removed already and was told that this would "dissolve" on it own. No signs of erythema on infection.   Minimal CAD LHC in 2017 showed mild non-obstructive CAD. Most recent cath in 10/2022 showed only minimal irregularities of left main but otherwise normal coronaries.  - No angina.  - Continue aspirin and statin.   Hypertension BP initially 150/84 but then improved to 138/80 on my personal recheck at the end of visit. - Continue current medications: Amlodipine 10mg  daily, Coreg 12.5mg  twice daily, and Losartan 100mg  daily.  - Asked patient to continue to monitor BP at home and notify us if consistent >130/80  Hyperlipidemia Lipid panel in 04/2022: Total Cholesterol 139, Triglycerides 84, HDL 51, LDL 72.  - Continue Lipitor 40mg  daily.   History of PE/  DVT History of acute saddle PE and bilateral DVT in 03/2020.  - She has been maintained on Eliquis.  - Management per primary team.   Disposition: Follow up in 3 months.   Signed, Corrin Parker, PA-C  01/04/2023 9:15 AM    [facility]

## 2022-12-28 ENCOUNTER — Ambulatory Visit: Payer: Self-pay | Admitting: *Deleted

## 2022-12-28 DIAGNOSIS — Z4802 Encounter for removal of sutures: Secondary | ICD-10-CM

## 2022-12-28 NOTE — Op Note (Signed)
Operative Note Addendum  Assistant: Lowella Dandy, PA-C: An experienced assistant was required given the complexity of this surgery and the standard of surgical care. The assistant was needed for exposure, dissection, suctioning, retraction of delicate tissues and sutures, instrument exchange and for overall help during this procedure.   [name], MD

## 2022-12-28 NOTE — Progress Notes (Signed)
Patient arrived for nurse visit to remove sutures post-Bentall 12/3 by Dr. Laneta Simmers.  Three sutures removed with no signs or symptoms of infection noted.  Incisions well approximated. Patient tolerated suture removal well.  Patient and family instructed to keep the incision site clean and dry. Patient and family acknowledged instructions given.  All questions answered.

## 2022-12-29 ENCOUNTER — Telehealth: Payer: Self-pay | Admitting: *Deleted

## 2022-12-29 NOTE — Telephone Encounter (Signed)
Contacted patient in regards to phone call placed with c/o stinging and burning in left breast. Per patient, pain comes and goes. Pain began yesterday. Discussed with Webb Laws, PA, advised patient to try lidocaine patches at area of concern. Advised to avoid placing directly over any surgical incision. Patient verbalized understanding.

## 2022-12-31 ENCOUNTER — Telehealth: Payer: Self-pay

## 2022-12-31 NOTE — Telephone Encounter (Signed)
Camie, RN with [facility] contacted the office with concerns about patient's pain. She states when she woke up this morning patient experiencing pain 9/10 on the left side. After pain medication she had 5/10 pain. Left voicemail message with call back advising patient has been in touch with the office regarding her pain. Lidocaine patch was prescribed as well as Tramadol at discharge. Will await call back if warranted.

## 2023-01-04 ENCOUNTER — Encounter: Payer: Self-pay | Admitting: Student

## 2023-01-04 ENCOUNTER — Ambulatory Visit: Payer: Medicare Other | Attending: Student | Admitting: Student

## 2023-01-04 VITALS — BP 138/80 | HR 71 | Ht 63.0 in | Wt 192.6 lb

## 2023-01-04 DIAGNOSIS — Z86718 Personal history of other venous thrombosis and embolism: Secondary | ICD-10-CM

## 2023-01-04 DIAGNOSIS — I7101 Dissection of ascending aorta: Secondary | ICD-10-CM

## 2023-01-04 DIAGNOSIS — Z952 Presence of prosthetic heart valve: Secondary | ICD-10-CM | POA: Diagnosis not present

## 2023-01-04 DIAGNOSIS — I10 Essential (primary) hypertension: Secondary | ICD-10-CM

## 2023-01-04 DIAGNOSIS — I351 Nonrheumatic aortic (valve) insufficiency: Secondary | ICD-10-CM | POA: Diagnosis not present

## 2023-01-04 DIAGNOSIS — E785 Hyperlipidemia, unspecified: Secondary | ICD-10-CM

## 2023-01-04 DIAGNOSIS — I2510 Atherosclerotic heart disease of native coronary artery without angina pectoris: Secondary | ICD-10-CM

## 2023-01-04 DIAGNOSIS — Z86711 Personal history of pulmonary embolism: Secondary | ICD-10-CM

## 2023-01-04 LAB — CBC

## 2023-01-04 NOTE — Patient Instructions (Addendum)
Medication Instructions:  NO CHANGES   Lab Work: CBC BMET    Testing/Procedures: NONE   Follow-Up: At [facility], you and your health needs are our priority.  As part of our continuing mission to provide you with exceptional heart care, we have created designated Provider Care Teams.  These Care Teams include your primary Cardiologist (physician) and Advanced Practice Providers (APPs -  Physician Assistants and Nurse Practitioners) who all work together to provide you with the care you need, when you need it.   Your next appointment:   3 month(s)  Provider:   Marjie Skiff, PA-C or [name], MD will plan to see you again in 3 month(s).   Other Instructions PLEASE KEEP UP WITH YOUR BLOOD PRESSURES AND CALL THE OFFICE IF YOUR BLOOD PRESSURE IS GREATER THAN 130/80.

## 2023-01-05 LAB — BASIC METABOLIC PANEL
BUN/Creatinine Ratio: 8 — ABNORMAL LOW (ref 12–28)
BUN: 7 mg/dL — ABNORMAL LOW (ref 8–27)
CO2: 27 mmol/L (ref 20–29)
Calcium: 9.2 mg/dL (ref 8.7–10.3)
Chloride: 99 mmol/L (ref 96–106)
Creatinine, Ser: 0.88 mg/dL (ref 0.57–1.00)
Glucose: 107 mg/dL — ABNORMAL HIGH (ref 70–99)
Potassium: 3.5 mmol/L (ref 3.5–5.2)
Sodium: 141 mmol/L (ref 134–144)
eGFR: 72 mL/min/{1.73_m2} (ref >59)

## 2023-01-05 LAB — CBC
Hematocrit: 33.7 % — ABNORMAL LOW (ref 34.0–46.6)
Hemoglobin: 10.3 g/dL — ABNORMAL LOW (ref 11.1–15.9)
MCH: 23.8 pg — ABNORMAL LOW (ref 26.6–33.0)
MCHC: 30.6 g/dL — ABNORMAL LOW (ref 31.5–35.7)
MCV: 78 fL — ABNORMAL LOW (ref 79–97)
Platelets: 418 10*3/uL (ref 150–450)
RBC: 4.33 x10E6/uL (ref 3.77–5.28)
RDW: 16.7 % — ABNORMAL HIGH (ref 11.7–15.4)
WBC: 8.9 10*3/uL (ref 3.4–10.8)

## 2023-01-12 ENCOUNTER — Other Ambulatory Visit (HOSPITAL_BASED_OUTPATIENT_CLINIC_OR_DEPARTMENT_OTHER): Payer: Self-pay

## 2023-01-12 ENCOUNTER — Other Ambulatory Visit (HOSPITAL_COMMUNITY): Payer: Self-pay

## 2023-01-19 ENCOUNTER — Other Ambulatory Visit: Payer: Self-pay | Admitting: Surgery

## 2023-01-19 DIAGNOSIS — I351 Nonrheumatic aortic (valve) insufficiency: Secondary | ICD-10-CM

## 2023-01-20 ENCOUNTER — Encounter: Payer: Self-pay | Admitting: Surgery

## 2023-01-20 ENCOUNTER — Ambulatory Visit (INDEPENDENT_AMBULATORY_CARE_PROVIDER_SITE_OTHER): Payer: Self-pay | Admitting: Surgery

## 2023-01-20 ENCOUNTER — Ambulatory Visit
Admission: RE | Admit: 2023-01-20 | Discharge: 2023-01-20 | Disposition: A | Discharge status: Home / Self Care | Payer: Medicare Other | Source: Ambulatory Visit | Attending: Surgery | Admitting: Surgery

## 2023-01-20 VITALS — BP 135/80 | HR 82 | Resp 20 | Ht 63.0 in | Wt 189.0 lb

## 2023-01-20 DIAGNOSIS — I351 Nonrheumatic aortic (valve) insufficiency: Secondary | ICD-10-CM

## 2023-01-20 DIAGNOSIS — Z09 Encounter for follow-up examination after completed treatment for conditions other than malignant neoplasm: Secondary | ICD-10-CM

## 2023-01-22 NOTE — Progress Notes (Signed)
   HPI: Patient returns for routine postoperative follow-up having undergone redo median sternotomy for biological Bentall procedure using a KONECT RESILIA pericardial valved conduit on 12/15/2022. The patient's early postoperative recovery while in the hospital was notable for an uncomplicated postoperative course. Since hospital discharge the patient reports that she continues to improve.  She is walking without chest pain or shortness of breath and her stamina has been slowly improving.  Her appetite is still not back to normal.  She is trying to maintain a healthy diet.   Current Outpatient Medications  Medication Sig Dispense Refill   acetaminophen  (TYLENOL ) 500 MG tablet Take 2 tablets (1,000 mg total) by mouth every 6 (six) hours as needed. 30 tablet 0   amLODipine  (NORVASC ) 10 MG tablet Take 1 tablet (10 mg total) by mouth daily. 90 tablet 3   apixaban  (ELIQUIS ) 5 MG TABS tablet Take 1 tablet by mouth 2 (two) times daily.     aspirin  EC 81 MG tablet Take 81 mg by mouth daily. Swallow whole.     atorvastatin  (LIPITOR) 40 MG tablet TAKE 1 TABLET BY MOUTH ONCE DAILY AT 6:00 IN THE  EVENING 90 tablet 3   carvedilol  (COREG ) 12.5 MG tablet Take 1 tablet (12.5 mg total) by mouth 2 (two) times daily. 180 tablet 3   colesevelam  (WELCHOL ) 625 MG tablet Take 1 tablet (625 mg total) by mouth every other day. As needed 30 tablet 1   diclofenac Sodium (VOLTAREN) 1 % GEL Apply 2-4 g topically 4 (four) times daily as needed (pain).     folic acid  (FOLVITE ) 1 MG tablet Take 1 mg by mouth daily.     loratadine  (CLARITIN ) 10 MG tablet Take 1 tablet (10 mg total) by mouth daily. (Patient taking differently: Take 10 mg by mouth daily as needed for allergies.) 30 tablet 0   losartan  (COZAAR ) 100 MG tablet Take 100 mg by mouth daily.     No current facility-administered medications for this visit.    Physical Exam: BP 135/80 (BP Location: Left Arm)   Pulse 82   Resp 20   Ht 5' 3 (1.6 m)   Wt 189 lb  (85.7 kg)   SpO2 97% Comment: RA  BMI 33.48 kg/m  She looks well. Cardiac exam shows a regular rate and rhythm with normal heart sounds.  There is no murmur. Lungs are clear. The chest incision is healing well and the sternum is stable. There is no peripheral edema.  Diagnostic Tests:  Narrative & Impression  CLINICAL DATA:  Status post cardiac surgery.   EXAM: CHEST - 2 VIEW   COMPARISON:  December 18, 2022.   FINDINGS: Stable cardiomediastinal silhouette. Status post aortic valve repair. Both lungs are clear. The visualized skeletal structures are unremarkable.   IMPRESSION: No active cardiopulmonary disease.     Electronically Signed   By: Lynwood Landy Raddle M.D.   On: 01/20/2023 16:11     Impression:  Overall she is doing very well 5 weeks following her surgery.  I told her that she can return to driving a car at this time but should refrain from lifting anything heavier than 10 pounds for 3 months postoperatively.  I encouraged her to continue increasing her activity and I think she would benefit from cardiac rehab.  Plan:  I will plan to see her back in 1 month for follow-up.   Dorise MARLA Fellers, MD [facility] [phone]

## 2023-01-26 ENCOUNTER — Telehealth (HOSPITAL_COMMUNITY): Payer: Self-pay

## 2023-01-26 ENCOUNTER — Encounter (HOSPITAL_COMMUNITY): Payer: Self-pay

## 2023-01-26 NOTE — Telephone Encounter (Signed)
 Pt insurance is active and benefits verified through [facility]. Co-pay $0.00, DED $0.00/$0.00 met, out of pocket $3,900.00/$0.00 met, co-insurance 0%. No pre-authorization required. Passport, 01/26/23 @ 1:57PM, REF#[id number]   How many CR sessions are covered? (36 visits for TCR, 72 visits for ICR)72 Is this a lifetime maximum or an annual maximum? Annual Has the member used any of these services to date? No Is there a time limit (weeks/months) on start of program and/or program completion? No

## 2023-01-26 NOTE — Telephone Encounter (Signed)
 Attempted to call patient in regards to Cardiac Rehab - LM on VM Mailed letter

## 2023-01-27 ENCOUNTER — Telehealth: Payer: Self-pay | Admitting: Cardiology

## 2023-01-27 NOTE — Telephone Encounter (Signed)
 Spoke with patient and she is aware she will not need echo tomorrow

## 2023-01-27 NOTE — Telephone Encounter (Signed)
 Patient stated she had an echocardiogram at Dr. Everlina Hock office and wants to know if she still needs to have the echocardiogram scheduled for tomorrow (1/16).

## 2023-01-28 ENCOUNTER — Ambulatory Visit (HOSPITAL_COMMUNITY): Payer: Medicare Other

## 2023-02-09 ENCOUNTER — Ambulatory Visit: Payer: Medicare Other | Admitting: Cardiology

## 2023-02-10 ENCOUNTER — Telehealth (HOSPITAL_COMMUNITY): Payer: Self-pay

## 2023-02-10 NOTE — Telephone Encounter (Signed)
No response from in regards to cardiac rehab.   Closed referral

## 2023-02-15 ENCOUNTER — Telehealth: Payer: Self-pay | Admitting: *Deleted

## 2023-02-15 NOTE — Telephone Encounter (Signed)
   Pre-operative Risk Assessment    Patient Name: Alexis Wheeler  DOB: 1955/08/04 MRN: [id number]   Date of last office visit: 01/04/23 Date of next office visit: 04/12/23   Request for Surgical Clearance    Procedure:   CATARACT EXTRACTION BY PE, IOL-LEFT THEN RIGHT   Date of Surgery:  Clearance TBD                                Surgeon:  DR. Nancee Liter Surgeon's Group or Practice Name:  [facility] Phone number:  [phone] Fax number:  [phone]   Type of Clearance Requested:   - Medical    Type of Anesthesia:  Not Indicated   Additional requests/questions:    [name]   02/15/2023, 2:37 PM

## 2023-02-15 NOTE — Telephone Encounter (Signed)
   Patient Name: Alexis Wheeler  DOB: [birth year] MRN: [id number]  Primary Cardiologist: [name], MD  Chart reviewed as part of pre-operative protocol coverage. Cataract extractions are recognized in guidelines as low risk surgeries that do not typically require specific preoperative testing or holding of blood thinner therapy. Therefore, given past medical history and time since last visit, based on ACC/AHA guidelines, Alexis Wheeler would be at acceptable risk for the planned procedure without further cardiovascular testing.   I will route this recommendation to the requesting party via Epic fax function and remove from pre-op pool.  Please call with questions.  Ronney Asters, NP 02/15/2023, 3:25 PM

## 2023-02-26 ENCOUNTER — Encounter: Payer: Self-pay | Admitting: Surgery

## 2023-02-26 ENCOUNTER — Ambulatory Visit (INDEPENDENT_AMBULATORY_CARE_PROVIDER_SITE_OTHER): Payer: Self-pay | Admitting: Surgery

## 2023-02-26 VITALS — BP 145/84 | HR 77 | Resp 18 | Ht 63.0 in | Wt 190.0 lb

## 2023-02-26 DIAGNOSIS — Z09 Encounter for follow-up examination after completed treatment for conditions other than malignant neoplasm: Secondary | ICD-10-CM

## 2023-02-26 NOTE — Progress Notes (Signed)
    HPI:  Patient returns for routine postoperative follow-up having undergone redo median sternotomy for biological Bentall procedure using a KONECT RESILIA pericardial valved conduit on 12/15/2022. The patient's early postoperative recovery while in the hospital was notable for an uncomplicated postoperative course.  Since I last saw her on 01/20/2023 she has continued to feel well.  She is walking without chest pain or shortness of breath.  Her stamina is good.  Her appetite is improved but she is still not sleeping that well.  She reports some soreness at the very top of her chest incision in the midline.  Current Outpatient Medications  Medication Sig Dispense Refill   acetaminophen (TYLENOL) 500 MG tablet Take 2 tablets (1,000 mg total) by mouth every 6 (six) hours as needed. 30 tablet 0   amLODipine (NORVASC) 10 MG tablet Take 1 tablet (10 mg total) by mouth daily. 90 tablet 3   apixaban (ELIQUIS) 5 MG TABS tablet Take 1 tablet by mouth 2 (two) times daily.     aspirin EC 81 MG tablet Take 81 mg by mouth daily. Swallow whole.     atorvastatin (LIPITOR) 40 MG tablet TAKE 1 TABLET BY MOUTH ONCE DAILY AT 6:00 IN THE  EVENING 90 tablet 3   carvedilol (COREG) 12.5 MG tablet Take 1 tablet (12.5 mg total) by mouth 2 (two) times daily. 180 tablet 3   colesevelam (WELCHOL) 625 MG tablet Take 1 tablet (625 mg total) by mouth every other day. As needed 30 tablet 1   diclofenac Sodium (VOLTAREN) 1 % GEL Apply 2-4 g topically 4 (four) times daily as needed (pain).     folic acid (FOLVITE) 1 MG tablet Take 1 mg by mouth daily.     loratadine (CLARITIN) 10 MG tablet Take 1 tablet (10 mg total) by mouth daily. (Patient taking differently: Take 10 mg by mouth daily as needed for allergies.) 30 tablet 0   losartan (COZAAR) 100 MG tablet Take 100 mg by mouth daily.     No current facility-administered medications for this visit.     Physical Exam: BP (!) 145/84 (BP Location: Left Arm)   Pulse 77    Resp 18   Ht 5\' 3"  (1.6 m)   Wt 190 lb (86.2 kg)   SpO2 94% Comment: RA  BMI 33.66 kg/m  She looks well. Cardiac exam shows a regular rate and rhythm with normal heart sounds.  There is no murmur. Lungs are clear. The chest incision is well-healed and the sternum is stable.  Diagnostic Tests:  None today  Impression:  She is a little over 2 months following her surgery and progressing well.  I encouraged her to continue walking is much as possible.  I told her that she could start increasing her activity but should not return to heavy lifting until the beginning of March.  She has follow-up with Dr. [name] in March and will need a follow-up new baseline echo.  Plan:  I will plan to see her back in 1 year with a CTA of the chest for aortic surveillance.   [name], MD [facility] [phone]

## 2023-03-01 NOTE — Telephone Encounter (Signed)
[facility] calling to check status please advise

## 2023-03-01 NOTE — Telephone Encounter (Signed)
Tried calling requesting office that pre op APP has forward clearance to them but unfortunately wasn't able to get through to anyone will try again at a later time

## 2023-03-03 ENCOUNTER — Ambulatory Visit: Payer: Medicare Other | Admitting: Surgery

## 2023-04-12 ENCOUNTER — Ambulatory Visit: Payer: Medicare Other | Attending: Cardiology | Admitting: Cardiology

## 2023-04-12 ENCOUNTER — Encounter: Payer: Self-pay | Admitting: Cardiology

## 2023-04-12 VITALS — BP 138/78 | HR 78 | Resp 16 | Ht 63.0 in | Wt 190.2 lb

## 2023-04-12 DIAGNOSIS — I7101 Dissection of ascending aorta: Secondary | ICD-10-CM | POA: Diagnosis not present

## 2023-04-12 DIAGNOSIS — Z86711 Personal history of pulmonary embolism: Secondary | ICD-10-CM

## 2023-04-12 DIAGNOSIS — E785 Hyperlipidemia, unspecified: Secondary | ICD-10-CM

## 2023-04-12 DIAGNOSIS — I2510 Atherosclerotic heart disease of native coronary artery without angina pectoris: Secondary | ICD-10-CM

## 2023-04-12 DIAGNOSIS — Z952 Presence of prosthetic heart valve: Secondary | ICD-10-CM

## 2023-04-12 DIAGNOSIS — I10 Essential (primary) hypertension: Secondary | ICD-10-CM | POA: Diagnosis not present

## 2023-04-12 DIAGNOSIS — Z86718 Personal history of other venous thrombosis and embolism: Secondary | ICD-10-CM

## 2023-04-12 DIAGNOSIS — I351 Nonrheumatic aortic (valve) insufficiency: Secondary | ICD-10-CM

## 2023-04-12 DIAGNOSIS — I359 Nonrheumatic aortic valve disorder, unspecified: Secondary | ICD-10-CM

## 2023-04-12 NOTE — Patient Instructions (Addendum)
 Medication Instructions:  Your physician recommends that you continue on your current medications as directed. Please refer to the Current Medication list given to you today.  *If you need a refill on your cardiac medications before your next appointment, please call your pharmacy*  Lab Work: Fasting lipid panel, ALT If you have labs (blood work) drawn today and your tests are completely normal, you will receive your results only by: MyChart Message (if you have MyChart) OR A paper copy in the mail If you have any lab test that is abnormal or we need to change your treatment, we will call you to review the results.  Testing/Procedures: Echocardiogram Your physician has requested that you have an echocardiogram. Echocardiography is a painless test that uses sound waves to create images of your heart. It provides your doctor with information about the size and shape of your heart and how well your heart's chambers and valves are working. This procedure takes approximately one hour. There are no restrictions for this procedure. Please do NOT wear cologne, perfume, aftershave, or lotions (deodorant is allowed). Please arrive 15 minutes prior to your appointment time.  Please note: We ask at that you not bring children with you during ultrasound (echo/ vascular) testing. Due to room size and safety concerns, children are not allowed in the ultrasound rooms during exams. Our front office staff cannot provide observation of children in our lobby area while testing is being conducted. An adult accompanying a patient to their appointment will only be allowed in the ultrasound room at the discretion of the ultrasound technician under special circumstances. We apologize for any inconvenience.   Follow-Up: At [facility] , you and your health needs are our priority.  As part of our continuing mission to provide you with exceptional heart care, our providers are all part of one team.  This team  includes your primary Cardiologist (physician) and Advanced Practice Providers or APPs (Physician Assistants and Nurse Practitioners) who all work together to provide you with the care you need, when you need it.  Your next appointment:   1 year(s)  Provider:   [name], MD    We recommend signing up for the patient portal called "MyChart".  Sign up information is provided on this After Visit Summary.  MyChart is used to connect with patients for Virtual Visits (Telemedicine).  Patients are able to view lab/test results, encounter notes, upcoming appointments, etc.  Non-urgent messages can be sent to your provider as well.   To learn more about what you can do with MyChart, go to [email or web address].   Other Instructions      1st Floor: - Lobby - Registration  - Pharmacy  - Lab - Cafe  2nd Floor: - PV Lab - Diagnostic Testing (echo, CT, nuclear med)  3rd Floor: - Vacant  4th Floor: - TCTS (cardiothoracic surgery) - AFib Clinic - Structural Heart Clinic - Vascular Surgery  - Vascular Ultrasound  5th Floor: - [facility] (general and EP) - Clinical Pharmacy for coumadin, hypertension, lipid, weight-loss medications, and med management appointments    Valet parking services will be available as well.

## 2023-04-12 NOTE — Progress Notes (Signed)
 Cardiology Office Note:    Date:  04/12/2023   ID:  Alexis Wheeler, DOB 02/20/1955, MRN [id number]  PCP:  [name], MD  Cardiologist:  [name], MD    Referring MD: [name], MD   Chief Complaint  Patient presents with   Coronary Artery Disease   Hypertension   Hyperlipidemia   Aortic Insuffiency     History of Present Illness:    Alexis Wheeler is a 68 y.o. female with a hx of ASCAD with cath 2017 showing 30% Ost RCA stenosis, 20% mid-LAD stenosis, and normal LV function.  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  She underwent replacement of the ascending aorta with resuspension of the AV with 30 mm Hemashield Platinum graft with R axillary artery cannulation by Dr. [name].  EF was 40-45% post op.  Follow up echo from 07/2014 showed improvement of her EF - now at 55 to 60%. She also has a history of hypertension, morbid obesity and OSA on CPAP.    In 2018 she had CTA when admitted with chest pain -- possible small PE versus scarring noted - she did not wish to be on blood thinner. No DVT on doppler study. She was not short of breath and clinically did not fit the picture for a PE. Earlier in 2020 she had lower extremity dopplers due to prior elevated d dimer and had DVT in the peroneal vein on the right. This had persisted and since below the knee and no PE - did not want anticoagulation.  She was hospitalized in March 2022 with acute saddle pulmonary embolism with large burden of lobar and distal embolus and more occlusive in the right pulmonary arteries, RVE and stable chronic aortic dissection with Bentall type ascending thoracic graft repair.    Since I saw her last she developed severe AI by TEE 09/30/2022 which showed moderate to severe eccentric AR mechanism felt due to dissection with aortic valve resuspension and persistently severely dilated  sinuses especially the left with failure of central coaptation and possible perforation of the left sinus.  She subsequently underwent Bentall procedure with 23 mm Konect Resilia pericardial valve conduit on 12/15/2022 by Dr. Laneta Simmers  She is here today for followup and is doing well.  She denies any chest pain or pressure, SOB, DOE, PND, orthopnea, LE edema, dizziness, palpitations or syncope. She is compliant with her meds and is tolerating meds with no SE.    Past Medical History:  Diagnosis Date   Anemia    childhood   Aortic insufficiency    Moderate to severe by echo 08/2022.  s/p Bentall procedure with 23 mm Konect Resilia pericardial valve conduit on 12/15/2022   Arthritis    right knee   Ascending aortic dissection (HCC)    s/p repair by Dr. [name].  2D echo 07/2020 with aortic root 41mm   Blood transfusion without reported diagnosis    with heart surgery-repair aorta   CAD (coronary artery disease)    a. 04/2015: cath showed 30% Ost RCA stenosis, 20% mid-LAD stenosis, and normal LV function.    Cataract    bilateral eyes, per pt.   CHF (congestive heart failure) (HCC)    Congestive dilated cardiomyopathy (HCC) 08/01/2014   DVT (deep venous thrombosis) (HCC) 2022   right leg   GERD    HEMATURIA UNSPECIFIED  History of kidney stones    x  3 Cystoscopy   HYPERLIPIDEMIA    HYPERTENSION    Morbid obesity (HCC)    Myocardial infarction (HCC)    01/30/2014   MYOSITIS    OSA on CPAP 05/10/2017   mild OSA with an AHI of 5.2/hr overall but severe during REM sleep with an AHI of 41.4/hr and underwent CPAP titration to 12cm H2O   Pre-diabetes    Pulmonary embolism with acute cor pulmonale (HCC) 03/2020   Saddle embolism with acute cor pulmonale   Sleep apnea    cpap-  doesnt use it- last study years ago   Vertigo     Past Surgical History:  Procedure Laterality Date   ABDOMINAL HYSTERECTOMY  1983   BENTALL PROCEDURE N/A 12/15/2022   Procedure: BENTALL PROCEDURE USING 23 MM  KONECT RESILIA AORTIC VALVED CONDUIT;  Surgeon: [name], MD;  Location: [facility] OR;  Service: Open Heart Surgery;  Laterality: N/A;   CARDIAC CATHETERIZATION N/A 04/30/2015   Procedure: Left Heart Cath and Coronary Angiography;  Surgeon: Peter M Swaziland, MD;  Location: [facility] INVASIVE CV LAB;  Service: Cardiovascular;  Laterality: N/A;   CHOLECYSTECTOMY  2001   CYSTOSCOPY W/ RETROGRADES Left 08/02/2015   Procedure: CYSTOSCOPY WITH LEFT RETROGRADE PYELOGRAM;  Surgeon: Jerilee Field, MD;  Location: [facility] ORS;  Service: Urology;  Laterality: Left;   CYSTOSCOPY WITH URETEROSCOPY, STONE BASKETRY AND STENT PLACEMENT Left 08/02/2015   Procedure:  Geoffry Paradise ;  Surgeon: Jerilee Field, MD;  Location: [facility] ORS;  Service: Urology;  Laterality: Left;   CYSTOSCOPY/URETEROSCOPY/HOLMIUM LASER/STENT PLACEMENT Left 08/02/2015   Procedure: CYSTOSCOPY/URETEROSCOPY/HOLMIUM LASER/STENT PLACEMENT-LEFT;  Surgeon: Jerilee Field, MD;  Location: [facility] ORS;  Service: Urology;  Laterality: Left;   HOLMIUM LASER APPLICATION Left 08/02/2015   Procedure: HOLMIUM LASER APPLICATION;  Surgeon: Jerilee Field, MD;  Location: [facility] ORS;  Service: Urology;  Laterality: Left;   REPLACEMENT ASCENDING AORTA N/A 03/03/2014   Procedure: REPAIR OF ASCENDING AORTIC DISSECTION;  Surgeon: Delight Ovens, MD;  Location: [facility] OR;  Service: Open Heart Surgery;  Laterality: N/A;   RIGHT/LEFT HEART CATH AND CORONARY ANGIOGRAPHY N/A 10/16/2022   Procedure: RIGHT/LEFT HEART CATH AND CORONARY ANGIOGRAPHY;  Surgeon: Elder Negus, MD;  Location: [facility] INVASIVE CV LAB;  Service: Cardiovascular;  Laterality: N/A;   TEE WITHOUT CARDIOVERSION N/A 09/30/2022   Procedure: TRANSESOPHAGEAL ECHOCARDIOGRAM;  Surgeon: Wendall Stade, MD;  Location: [facility] INVASIVE CV LAB;  Service: Cardiovascular;  Laterality: N/A;   TEE WITHOUT CARDIOVERSION N/A 12/15/2022   Procedure: TRANSESOPHAGEAL ECHOCARDIOGRAM (TEE);  Surgeon: [name], MD;  Location: [facility] OR;  Service:  Open Heart Surgery;  Laterality: N/A;    Current Medications: Current Meds  Medication Sig   acetaminophen (TYLENOL) 500 MG tablet Take 2 tablets (1,000 mg total) by mouth every 6 (six) hours as needed.   amLODipine (NORVASC) 10 MG tablet Take 1 tablet (10 mg total) by mouth daily.   apixaban (ELIQUIS) 5 MG TABS tablet Take 1 tablet by mouth 2 (two) times daily.   aspirin EC 81 MG tablet Take 81 mg by mouth daily. Swallow whole.   atorvastatin (LIPITOR) 40 MG tablet TAKE 1 TABLET BY MOUTH ONCE DAILY AT 6:00 IN THE  EVENING   carvedilol (COREG) 12.5 MG tablet Take 1 tablet (12.5 mg total) by mouth 2 (two) times daily.   colesevelam (WELCHOL) 625 MG tablet Take 1 tablet (625 mg total) by mouth every other day. As needed   diclofenac Sodium (VOLTAREN)  1 % GEL Apply 2-4 g topically 4 (four) times daily as needed (pain).   folic acid (FOLVITE) 1 MG tablet Take 1 mg by mouth daily.   loratadine (CLARITIN) 10 MG tablet Take 1 tablet (10 mg total) by mouth daily. (Patient taking differently: Take 10 mg by mouth daily as needed for allergies.)   losartan (COZAAR) 100 MG tablet Take 100 mg by mouth daily.   prednisoLONE acetate (PRED FORTE) 1 % ophthalmic suspension Place 2 drops into both eyes every 2 (two) hours while awake.     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  Vaping Use   Vaping status: Never Used  Substance and Sexual Activity   Alcohol use: No   Drug use: Not Currently    Types: Marijuana   Sexual activity: Never  Other Topics Concern   Not on file   Social History Narrative   Not on file   Social Drivers of Health   Financial Resource Strain: Not on file  Food Insecurity: No Food Insecurity (12/18/2022)   Hunger Vital Sign    Worried About Running Out of Food in the Last Year: Never true    Ran Out of Food in the Last Year: Never true  Transportation Needs: No Transportation Needs (12/18/2022)   PRAPARE - Administrator, Civil Service (Medical): No    Lack of Transportation (Non-Medical): No  Physical Activity: Not on file  Stress: Not on file  Social Connections: Unknown (05/27/2021)   [facility]   Social Network    Social Network: Not on file     Family History: The patient's family history includes Aortic dissection (age of onset: 60) in her mother; Breast cancer in her daughter, niece, and sister; Breast cancer (age of onset: 30) in her mother; Colon cancer in her brother and daughter; Esophageal cancer in her brother; Hypertension in her daughter, father, mother, and sister; Liver cancer in her daughter; Throat cancer in her brother. There is no history of Seizures, Pancreatic cancer, Rectal cancer, or Stomach cancer.  ROS:   Please see the history of present illness.    ROS  All other systems reviewed and negative.   EKGs/Labs/Other Studies Reviewed:    The following studies were reviewed today:   Recent Labs: 05/06/2022: TSH 1.16 12/18/2022: ALT 7; Magnesium 1.7 01/04/2023: BUN 7; Creatinine, Ser 0.88; Hemoglobin 10.3; Platelets 418; Potassium 3.5; Sodium 141   Recent Lipid Panel    Component Value Date/Time   CHOL 139 05/06/2022 0000   CHOL 146 09/06/2018 1109   TRIG 84 05/06/2022 0000   HDL 51 05/06/2022 0000   HDL 53 09/06/2018 1109   CHOLHDL 2.7 05/06/2022 0000   VLDL 27 12/24/2015 1059   LDLCALC 72 05/06/2022 0000    Physical Exam:    VS:  BP 138/78 (BP Location: Left Arm, Patient Position: Sitting, Cuff Size: Large)   Pulse 78   Resp 16   Ht 5\' 3"  (  1.6  m)   Wt 190 lb 3.2 oz (86.3 kg)   SpO2 96%   BMI 33.69 kg/m     Wt Readings from Last 3 Encounters:  04/12/23 190 lb 3.2 oz (86.3 kg)  02/26/23 190 lb (86.2 kg)  01/20/23 189 lb (85.7 kg)    GEN: Well nourished, well developed in no acute distress HEENT: Normal NECK: No JVD; No carotid bruits LYMPHATICS: No lymphadenopathy CARDIAC:RRR, no  rubs, gallops 2/6 SM at RUSB to LUSB RESPIRATORY:  Clear to auscultation without rales, wheezing or rhonchi  ABDOMEN: Soft, non-tender, non-distended MUSCULOSKELETAL:  No edema; No deformity  SKIN: Warm and dry NEUROLOGIC:  Alert and oriented x 3 PSYCHIATRIC:  Normal affect  ASSESSMENT:    1. Coronary artery disease involving native coronary artery of native heart without angina pectoris   2. Primary hypertension   3. Hyperlipidemia, unspecified hyperlipidemia type   4. Hitory of dissection of ascending aorta (HCC)   5. S/p Bentall procedure   6. History of DVT (deep vein thrombosis)   7. History of pulmonary embolus (PE)   8. Nonrheumatic aortic valve regurgitation      PLAN:    In order of problems listed above:   ASCAD  -nonobstructive by cardiac cath in 2017 showing 30% Ost RCA stenosis, 20% mid-LAD stenosis.   -Lexiscan Myoview 2020 showed no ischemia and EF 57%. -Repeat cath 10/16/2022 showed minimal luminal irregularities of the left main otherwise no CAD. -No chest pain since her last cath -Continue prescription drug management with aspirin 81 mg daily, carvedilol 12.5 mg twice daily, amlodipine 10 mg daily and atorvastatin 40 mg daily with as needed refills  Hypertension  -BP is controlled on exam today -Continue prescription drug managed with amlodipine 10 mg daily, carvedilol 12.5 mg twice daily and losartan 100 mg daily with as needed refills -I have personally reviewed and interpreted outside labs performed by patient's PCP which showed serum creatinine 0.88 and potassium 3.5 on 01/04/2023  Hyperlipidemia with LDL   -LDL goal < 70 -I have personally reviewed and interpreted outside labs performed by patient's PCP which showed LDL 72 and HDL 51 on 05/06/2022.  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 -TEE 09/30/2022 showed moderate to severe eccentric AR mechanism felt due to dissection with aortic valve resuspension and persistently severely dilated sinuses especially the left with failure of central coaptation and possible perforation of the left sinus. -She subsequently underwent Bentall procedure with 23 mm Konect Resilia pericardial valve conduit on 12/15/2022 by Dr. Laneta Simmers -Will repeat 2D echo now that she has had valve replacement   Followup:  1 year   Medication Adjustments/Labs and Tests Ordered: Current medicines are reviewed at length with the patient today.  Concerns regarding medicines are outlined above.  No orders of the defined types were placed in this encounter.   No orders of the defined types were placed in this encounter.    Signed, [name], MD  04/12/2023 1:07 PM  [facility]

## 2023-04-12 NOTE — Addendum Note (Signed)
 Addended by: Erick Alley on: 04/12/2023 01:26 PM   Modules accepted: Orders

## 2023-04-20 LAB — ALT: ALT: 14 IU/L (ref 0–32)

## 2023-04-20 LAB — LIPID PANEL
Chol/HDL Ratio: 2.3 ratio (ref 0.0–4.4)
Cholesterol, Total: 129 mg/dL (ref 100–199)
HDL: 56 mg/dL (ref >39)
LDL Chol Calc (NIH): 61 mg/dL (ref 0–99)
Triglycerides: 57 mg/dL (ref 0–149)
VLDL Cholesterol Cal: 12 mg/dL (ref 5–40)

## 2023-05-13 ENCOUNTER — Ambulatory Visit (HOSPITAL_COMMUNITY)

## 2023-06-14 ENCOUNTER — Ambulatory Visit (HOSPITAL_COMMUNITY): Admission: RE | Admit: 2023-06-14 | Source: Ambulatory Visit | Attending: Cardiology | Admitting: Cardiology

## 2023-06-15 ENCOUNTER — Encounter (HOSPITAL_COMMUNITY): Payer: Self-pay | Admitting: Cardiology

## 2024-01-24 ENCOUNTER — Other Ambulatory Visit: Payer: Self-pay | Admitting: Surgery

## 2024-01-24 DIAGNOSIS — I351 Nonrheumatic aortic (valve) insufficiency: Secondary | ICD-10-CM

## 2024-02-21 ENCOUNTER — Ambulatory Visit (HOSPITAL_COMMUNITY)

## 2024-03-01 ENCOUNTER — Ambulatory Visit: Admitting: Surgery

## 2024-03-08 ENCOUNTER — Ambulatory Visit: Payer: Self-pay | Admitting: Surgery
